# Patient Record
Sex: Male | Born: 1990 | Race: Black or African American | Hispanic: No | Marital: Single | Smoking: Current every day smoker
Health system: Southern US, Community
[De-identification: ages and names within clinical notes are randomized; demographics above are authoritative.]

## PROBLEM LIST (undated history)

## (undated) DIAGNOSIS — I10 Essential (primary) hypertension: Secondary | ICD-10-CM

## (undated) DIAGNOSIS — K921 Melena: Secondary | ICD-10-CM

## (undated) DIAGNOSIS — R569 Unspecified convulsions: Secondary | ICD-10-CM

## (undated) DIAGNOSIS — D124 Benign neoplasm of descending colon: Secondary | ICD-10-CM

## (undated) DIAGNOSIS — J45909 Unspecified asthma, uncomplicated: Secondary | ICD-10-CM

## (undated) HISTORY — DX: Melena: K92.1

## (undated) HISTORY — DX: Unspecified convulsions: R56.9

## (undated) HISTORY — DX: Benign neoplasm of descending colon: D12.4

---
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
## 2009-05-27 ENCOUNTER — Ambulatory Visit: Payer: Self-pay | Admitting: Family Medicine

## 2009-06-13 ENCOUNTER — Encounter (INDEPENDENT_AMBULATORY_CARE_PROVIDER_SITE_OTHER): Payer: Self-pay | Admitting: *Deleted

## 2009-06-13 DIAGNOSIS — F17200 Nicotine dependence, unspecified, uncomplicated: Secondary | ICD-10-CM | POA: Insufficient documentation

## 2009-11-24 ENCOUNTER — Ambulatory Visit: Payer: Self-pay | Admitting: Family Medicine

## 2009-11-24 ENCOUNTER — Encounter: Payer: Self-pay | Admitting: Family Medicine

## 2009-11-25 ENCOUNTER — Telehealth: Payer: Self-pay | Admitting: Family Medicine

## 2009-12-09 ENCOUNTER — Ambulatory Visit: Payer: Self-pay | Admitting: Family Medicine

## 2010-02-01 ENCOUNTER — Emergency Department (HOSPITAL_COMMUNITY): Admission: EM | Admit: 2010-02-01 | Discharge: 2010-02-02 | Payer: Self-pay | Admitting: Emergency Medicine

## 2010-07-09 ENCOUNTER — Encounter: Payer: Self-pay | Admitting: Family Medicine

## 2010-07-12 ENCOUNTER — Emergency Department (HOSPITAL_COMMUNITY)
Admission: EM | Admit: 2010-07-12 | Discharge: 2010-07-12 | Payer: Self-pay | Source: Home / Self Care | Admitting: Emergency Medicine

## 2010-10-04 ENCOUNTER — Ambulatory Visit: Admit: 2010-10-04 | Payer: Self-pay

## 2010-10-05 NOTE — Assessment & Plan Note (Signed)
Summary: NURSE VISIT/SUTURE REMOVAL/KH    Complete Medication List: 1)  Proair Hfa 108 (90 Base) Mcg/act Aers (Albuterol sulfate) .... 2 puffs qid prn 2)  Ultram 50 Mg Tabs (Tramadol hcl) .Marland Kitchen.. 1 tab by mouth q6h as needed for pain  Other Orders: Est Level 1- [facility] (29562)
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
## 2010-10-05 NOTE — Progress Notes (Signed)
Summary: meds prob  Medications Added ULTRAM 50 MG TABS (TRAMADOL HCL) 1 tab by mouth q6h as needed for pain       Phone Note Call from Patient Call back at [phone]   Caller: Patient Summary of Call: pt is allergic to Ibuprofen and is in pain today - needs something else called in. [facility]- [address]  Initial call taken by: De Nurse,  November 25, 2009 9:46 AM  Follow-up for Phone Call        "has a voice mailbox that has not been set up yet" will wait for him to call back. will foward to pcp Follow-up by: Golden Circle RN,  November 25, 2009 9:51 AM  Additional Follow-up for Phone Call Additional follow up Details #1::        he is taking 4-5 of the motrin 800 at a time. told him to stop & why. states tylenol does not help either. to md to see if there is something else he wants to order Additional Follow-up by: Golden Circle RN,  November 25, 2009 11:28 AM    Additional Follow-up for Phone Call Additional follow up Details #2::    Mom calling back checking on status of pain meds for son. Follow-up by: Clydell Hakim,  November 26, 2009 9:56 AM  Additional Follow-up for Phone Call Additional follow up Details #3:: Details for Additional Follow-up Action Taken: I specifically ask the patient if he had any allergies and he denied any allergies.  Will give him 4 day supply of ultram. Additional Follow-up by: Marisue Ivan  MD,  November 26, 2009 1:43 PM  New/Updated Medications: ULTRAM 50 MG TABS (TRAMADOL HCL) 1 tab by mouth q6h as needed for pain Prescriptions: ULTRAM 50 MG TABS (TRAMADOL HCL) 1 tab by mouth q6h as needed for pain  #16 x 0   Entered and Authorized by:   Marisue Ivan  MD   Signed by:   Marisue Ivan  MD on 11/26/2009   Method used:   Electronically to        [facility] [phone]* (retail)       [address]       [place], [place]  [address]       Ph: [phone]       Fax: [phone]   RxID:   [id number]

## 2010-10-05 NOTE — Miscellaneous (Signed)
Summary: Clean up Problem List

## 2010-10-05 NOTE — Miscellaneous (Signed)
Summary: Consent: Laceration Repair  Consent: Laceration Repair   Imported By: Knox Royalty 12/21/2009 11:46:11  _____________________________________________________________________  External Attachment:    Type:   Image     Comment:   External Document

## 2013-02-01 ENCOUNTER — Emergency Department (HOSPITAL_COMMUNITY)
Admission: EM | Admit: 2013-02-01 | Discharge: 2013-02-01 | Discharge status: Against Medical Advice | Payer: No Typology Code available for payment source | Attending: Emergency Medicine | Admitting: Emergency Medicine

## 2013-02-01 ENCOUNTER — Encounter (HOSPITAL_COMMUNITY): Payer: Self-pay | Admitting: *Deleted

## 2013-02-01 ENCOUNTER — Emergency Department (INDEPENDENT_AMBULATORY_CARE_PROVIDER_SITE_OTHER)
Admission: EM | Admit: 2013-02-01 | Discharge: 2013-02-01 | Disposition: A | Discharge status: Nursing Home (Includes ICF) | Payer: Self-pay | Source: Home / Self Care | Attending: Family Medicine | Admitting: Family Medicine

## 2013-02-01 ENCOUNTER — Emergency Department (HOSPITAL_COMMUNITY): Payer: No Typology Code available for payment source

## 2013-02-01 ENCOUNTER — Emergency Department (HOSPITAL_COMMUNITY)
Admission: EM | Admit: 2013-02-01 | Discharge: 2013-02-01 | Disposition: A | Discharge status: Home / Self Care | Payer: No Typology Code available for payment source | Attending: Emergency Medicine | Admitting: Emergency Medicine

## 2013-02-01 DIAGNOSIS — S0990XA Unspecified injury of head, initial encounter: Secondary | ICD-10-CM | POA: Insufficient documentation

## 2013-02-01 DIAGNOSIS — F17200 Nicotine dependence, unspecified, uncomplicated: Secondary | ICD-10-CM | POA: Insufficient documentation

## 2013-02-01 DIAGNOSIS — R112 Nausea with vomiting, unspecified: Secondary | ICD-10-CM | POA: Insufficient documentation

## 2013-02-01 DIAGNOSIS — M542 Cervicalgia: Secondary | ICD-10-CM

## 2013-02-01 DIAGNOSIS — Y998 Other external cause status: Secondary | ICD-10-CM | POA: Insufficient documentation

## 2013-02-01 DIAGNOSIS — R51 Headache: Secondary | ICD-10-CM

## 2013-02-01 DIAGNOSIS — R519 Headache, unspecified: Secondary | ICD-10-CM

## 2013-02-01 DIAGNOSIS — Y92410 Unspecified street and highway as the place of occurrence of the external cause: Secondary | ICD-10-CM | POA: Insufficient documentation

## 2013-02-01 DIAGNOSIS — Y9389 Activity, other specified: Secondary | ICD-10-CM | POA: Insufficient documentation

## 2013-02-01 MED ORDER — IBUPROFEN 800 MG PO TABS: 800.0000 mg | ORAL_TABLET | Freq: Three times a day (TID) | ORAL | Status: DC

## 2013-02-01 MED ORDER — ONDANSETRON HCL 4 MG PO TABS: 4.0000 mg | ORAL_TABLET | Freq: Four times a day (QID) | ORAL | Status: DC

## 2013-02-01 MED ORDER — CYCLOBENZAPRINE HCL 10 MG PO TABS: 10.0000 mg | ORAL_TABLET | Freq: Two times a day (BID) | ORAL | Status: DC | PRN

## 2013-02-01 NOTE — ED Notes (Signed)
Pt wanting to leave so he can go pick up his son. MD aware. Pt is going to leave AMA. Pt informed of risks if he leaves.
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
## 2013-02-01 NOTE — ED Notes (Signed)
Chart review.
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
## 2013-02-02 NOTE — ED Provider Notes (Signed)
Medical screening examination/treatment/procedure(s) were performed by non-physician practitioner and as supervising physician I was immediately available for consultation/collaboration.  Kymiah Araiza, MD 02/02/13 0022 

## 2013-02-02 NOTE — ED Provider Notes (Signed)
Medical screening examination/treatment/procedure(s) were performed by resident physician or non-physician practitioner and as supervising physician I was immediately available for consultation/collaboration.   Barkley Bruns MD.   Linna Hoff, MD 02/02/13 [phone]

## 2014-07-09 ENCOUNTER — Ambulatory Visit: Payer: Self-pay | Admitting: Family Medicine

## 2016-02-28 ENCOUNTER — Emergency Department (HOSPITAL_COMMUNITY)
Admission: EM | Admit: 2016-02-28 | Discharge: 2016-02-28 | Disposition: A | Discharge status: Home / Self Care | Payer: No Typology Code available for payment source | Attending: Emergency Medicine | Admitting: Emergency Medicine

## 2016-02-28 ENCOUNTER — Encounter (HOSPITAL_COMMUNITY): Payer: Self-pay | Admitting: *Deleted

## 2016-02-28 DIAGNOSIS — J45909 Unspecified asthma, uncomplicated: Secondary | ICD-10-CM | POA: Diagnosis not present

## 2016-02-28 DIAGNOSIS — M545 Low back pain, unspecified: Secondary | ICD-10-CM

## 2016-02-28 DIAGNOSIS — M542 Cervicalgia: Secondary | ICD-10-CM | POA: Diagnosis not present

## 2016-02-28 DIAGNOSIS — Y999 Unspecified external cause status: Secondary | ICD-10-CM | POA: Insufficient documentation

## 2016-02-28 DIAGNOSIS — Y939 Activity, unspecified: Secondary | ICD-10-CM | POA: Insufficient documentation

## 2016-02-28 DIAGNOSIS — F17210 Nicotine dependence, cigarettes, uncomplicated: Secondary | ICD-10-CM | POA: Insufficient documentation

## 2016-02-28 DIAGNOSIS — Y92410 Unspecified street and highway as the place of occurrence of the external cause: Secondary | ICD-10-CM | POA: Insufficient documentation

## 2016-02-28 DIAGNOSIS — M791 Myalgia: Secondary | ICD-10-CM | POA: Diagnosis present

## 2016-02-28 HISTORY — DX: Unspecified asthma, uncomplicated: J45.909

## 2016-02-28 MED ORDER — NAPROXEN 250 MG PO TABS: 250.0000 mg | ORAL_TABLET | Freq: Two times a day (BID) | ORAL | Status: DC

## 2016-02-28 MED ORDER — METHOCARBAMOL 500 MG PO TABS: 500.0000 mg | ORAL_TABLET | Freq: Two times a day (BID) | ORAL | Status: DC | PRN
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
## 2016-03-14 ENCOUNTER — Emergency Department (HOSPITAL_COMMUNITY): Payer: Medicaid Other

## 2016-03-14 ENCOUNTER — Emergency Department (HOSPITAL_COMMUNITY)
Admission: EM | Admit: 2016-03-14 | Discharge: 2016-03-14 | Disposition: A | Discharge status: Home / Self Care | Payer: Medicaid Other | Attending: Emergency Medicine | Admitting: Emergency Medicine

## 2016-03-14 ENCOUNTER — Encounter (HOSPITAL_COMMUNITY): Payer: Self-pay | Admitting: *Deleted

## 2016-03-14 DIAGNOSIS — Y929 Unspecified place or not applicable: Secondary | ICD-10-CM | POA: Insufficient documentation

## 2016-03-14 DIAGNOSIS — S62334A Displaced fracture of neck of fourth metacarpal bone, right hand, initial encounter for closed fracture: Secondary | ICD-10-CM | POA: Insufficient documentation

## 2016-03-14 DIAGNOSIS — Y999 Unspecified external cause status: Secondary | ICD-10-CM | POA: Diagnosis not present

## 2016-03-14 DIAGNOSIS — S62316A Displaced fracture of base of fifth metacarpal bone, right hand, initial encounter for closed fracture: Secondary | ICD-10-CM | POA: Insufficient documentation

## 2016-03-14 DIAGNOSIS — S6291XA Unspecified fracture of right wrist and hand, initial encounter for closed fracture: Secondary | ICD-10-CM

## 2016-03-14 DIAGNOSIS — F17210 Nicotine dependence, cigarettes, uncomplicated: Secondary | ICD-10-CM | POA: Insufficient documentation

## 2016-03-14 DIAGNOSIS — S6991XA Unspecified injury of right wrist, hand and finger(s), initial encounter: Secondary | ICD-10-CM | POA: Diagnosis present

## 2016-03-14 DIAGNOSIS — I10 Essential (primary) hypertension: Secondary | ICD-10-CM | POA: Diagnosis not present

## 2016-03-14 DIAGNOSIS — Y939 Activity, unspecified: Secondary | ICD-10-CM | POA: Diagnosis not present

## 2016-03-14 DIAGNOSIS — J45909 Unspecified asthma, uncomplicated: Secondary | ICD-10-CM | POA: Diagnosis not present

## 2016-03-14 MED ORDER — IBUPROFEN 400 MG PO TABS
800.0000 mg | ORAL_TABLET | Freq: Once | ORAL | Status: AC
  Administered 2016-03-14: 800 mg via ORAL
  Filled 2016-03-14: qty 2

## 2016-03-14 MED ORDER — TRAMADOL HCL 50 MG PO TABS: 50.0000 mg | ORAL_TABLET | Freq: Four times a day (QID) | ORAL | Status: DC | PRN

## 2016-03-14 MED ORDER — NAPROXEN 500 MG PO TABS: 500.0000 mg | ORAL_TABLET | Freq: Two times a day (BID) | ORAL | Status: DC

## 2016-03-14 NOTE — ED Notes (Signed)
Ortho aware of splint

## 2016-03-14 NOTE — Progress Notes (Signed)
Orthopedic Tech Progress Note Patient Details:  Timothy Hubbard March 07, 1991 [id number]  Ortho Devices Type of Ortho Device: Arm sling, Ulna gutter splint Ortho Device/Splint Location: rue Ortho Device/Splint Interventions: Ordered, Application   Karolee Stamps 03/14/2016, 10:27 PM

## 2016-03-14 NOTE — ED Notes (Signed)
Patient called for room 3X with no response 
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
## 2016-03-14 NOTE — Discharge Instructions (Signed)
Hypertension Hypertension, commonly called high blood pressure, is when the force of blood pumping through your arteries is too strong. Your arteries are the blood vessels that carry blood from your heart throughout your body. A blood pressure reading consists of a higher number over a lower number, such as 110/72. The higher number (systolic) is the pressure inside your arteries when your heart pumps. The lower number (diastolic) is the pressure inside your arteries when your heart relaxes. Ideally you want your blood pressure below 120/80. Hypertension forces your heart to work harder to pump blood. Your arteries may become narrow or stiff. Having untreated or uncontrolled hypertension can cause heart attack, stroke, kidney disease, and other problems. RISK FACTORS Some risk factors for high blood pressure are controllable. Others are not.  Risk factors you cannot control include:   Race. You may be at higher risk if you are African American.  Age. Risk increases with age.  Gender. Men are at higher risk than women before age 52 years. After age 51, women are at higher risk than men. Risk factors you can control include:  Not getting enough exercise or physical activity.  Being overweight.  Getting too much fat, sugar, calories, or salt in your diet.  Drinking too much alcohol. SIGNS AND SYMPTOMS Hypertension does not usually cause signs or symptoms. Extremely high blood pressure (hypertensive crisis) may cause headache, anxiety, shortness of breath, and nosebleed. DIAGNOSIS To check if you have hypertension, your health care provider will measure your blood pressure while you are seated, with your arm held at the level of your heart. It should be measured at least twice using the same arm. Certain conditions can cause a difference in blood pressure between your right and left arms. A blood pressure reading that is higher than normal on one occasion does not mean that you need treatment.  If it is not clear whether you have high blood pressure, you may be asked to return on a different day to have your blood pressure checked again. Or, you may be asked to monitor your blood pressure at home for 1 or more weeks. TREATMENT Treating high blood pressure includes making lifestyle changes and possibly taking medicine. Living a healthy lifestyle can help lower high blood pressure. You may need to change some of your habits. Lifestyle changes may include:  Following the DASH diet. This diet is high in fruits, vegetables, and whole grains. It is low in salt, red meat, and added sugars.  Keep your sodium intake below 2,300 mg per day.  Getting at least 30-45 minutes of aerobic exercise at least 4 times per week.  Losing weight if necessary.  Not smoking.  Limiting alcoholic beverages.  Learning ways to reduce stress. Your health care provider may prescribe medicine if lifestyle changes are not enough to get your blood pressure under control, and if one of the following is true:  You are 34-43 years of age and your systolic blood pressure is above 140.  You are 20 years of age or older, and your systolic blood pressure is above 150.  Your diastolic blood pressure is above 90.  You have diabetes, and your systolic blood pressure is over [id number] or your diastolic blood pressure is over 90.  You have kidney disease and your blood pressure is above 140/90.  You have heart disease and your blood pressure is above 140/90. Your personal target blood pressure may vary depending on your medical conditions, your age, and other factors. HOME CARE INSTRUCTIONS  Have your blood pressure rechecked as directed by your health care provider.   Take medicines only as directed by your health care provider. Follow the directions carefully. Blood pressure medicines must be taken as prescribed. The medicine does not work as well when you skip doses. Skipping doses also puts you at risk for  problems.  Do not smoke.   Monitor your blood pressure at home as directed by your health care provider. SEEK MEDICAL CARE IF:   You think you are having a reaction to medicines taken.  You have recurrent headaches or feel dizzy.  You have swelling in your ankles.  You have trouble with your vision. SEEK IMMEDIATE MEDICAL CARE IF:  You develop a severe headache or confusion.  You have unusual weakness, numbness, or feel faint.  You have severe chest or abdominal pain.  You vomit repeatedly.  You have trouble breathing. MAKE SURE YOU:   Understand these instructions.  Will watch your condition.  Will get help right away if you are not doing well or get worse.   This information is not intended to replace advice given to you by your health care provider. Make sure you discuss any questions you have with your health care provider.   Document Released: 08/22/2005 Document Revised: 01/06/2015 Document Reviewed: 06/14/2013 Elsevier Interactive Patient Education 2016 [employer] DASH Eating Plan DASH stands for "Dietary Approaches to Stop Hypertension." The DASH eating plan is a healthy eating plan that has been shown to reduce high blood pressure (hypertension). Additional health benefits may include reducing the risk of type 2 diabetes mellitus, heart disease, and stroke. The DASH eating plan may also help with weight loss. WHAT DO I NEED TO KNOW ABOUT THE DASH EATING PLAN? For the DASH eating plan, you will follow these general guidelines:  Choose foods with a percent daily value for sodium of less than 5% (as listed on the food label).  Use salt-free seasonings or herbs instead of table salt or sea salt.  Check with your health care provider or pharmacist before using salt substitutes.  Eat lower-sodium products, often labeled as "lower sodium" or "no salt added."  Eat fresh foods.  Eat more vegetables, fruits, and low-fat dairy products.  Choose whole grains.  Look for the word "whole" as the first word in the ingredient list.  Choose fish and skinless chicken or turkey more often than red meat. Limit fish, poultry, and meat to 6 oz (170 g) each day.  Limit sweets, desserts, sugars, and sugary drinks.  Choose heart-healthy fats.  Limit cheese to 1 oz (28 g) per day.  Eat more home-cooked food and less restaurant, buffet, and fast food.  Limit fried foods.  Cook foods using methods other than frying.  Limit canned vegetables. If you do use them, rinse them well to decrease the sodium.  When eating at a restaurant, ask that your food be prepared with less salt, or no salt if possible. WHAT FOODS CAN I EAT? Seek help from a dietitian for individual calorie needs. Grains Whole grain or whole wheat bread. Brown rice. Whole grain or whole wheat pasta. Quinoa, bulgur, and whole grain cereals. Low-sodium cereals. Corn or whole wheat flour tortillas. Whole grain cornbread. Whole grain crackers. Low-sodium crackers. Vegetables Fresh or frozen vegetables (raw, steamed, roasted, or grilled). Low-sodium or reduced-sodium tomato and vegetable juices. Low-sodium or reduced-sodium tomato sauce and paste. Low-sodium or reduced-sodium canned vegetables.  Fruits All fresh, canned (in natural juice), or frozen fruits. Meat and Other   Other Protein Products Ground beef (85% or leaner), grass-fed beef, or beef trimmed of fat. Skinless chicken or [place]. Ground chicken or [place]. Pork trimmed of fat. All fish and seafood. Eggs. Dried beans, peas, or lentils. Unsalted nuts and seeds. Unsalted canned beans. Dairy Low-fat dairy products, such as skim or 1% milk, 2% or reduced-fat cheeses, low-fat ricotta or cottage cheese, or plain low-fat yogurt. Low-sodium or reduced-sodium cheeses. Fats and Oils Tub margarines without trans fats. Light or reduced-fat mayonnaise and salad dressings (reduced sodium). Avocado. Safflower, olive, or canola oils. Natural peanut or  almond butter. Other Unsalted popcorn and pretzels. The items listed above may not be a complete list of recommended foods or beverages. Contact your dietitian for more options. WHAT FOODS ARE NOT RECOMMENDED? Grains White bread. White pasta. White rice. Refined cornbread. Bagels and croissants. Crackers that contain trans fat. Vegetables Creamed or fried vegetables. Vegetables in a cheese sauce. Regular canned vegetables. Regular canned tomato sauce and paste. Regular tomato and vegetable juices. Fruits Dried fruits. Canned fruit in light or heavy syrup. Fruit juice. Meat and Other Protein Products Fatty cuts of meat. Ribs, chicken wings, bacon, sausage, bologna, salami, chitterlings, fatback, hot dogs, bratwurst, and packaged luncheon meats. Salted nuts and seeds. Canned beans with salt. Dairy Whole or 2% milk, cream, half-and-half, and cream cheese. Whole-fat or sweetened yogurt. Full-fat cheeses or blue cheese. Nondairy creamers and whipped toppings. Processed cheese, cheese spreads, or cheese curds. Condiments Onion and garlic salt, seasoned salt, table salt, and sea salt. Canned and packaged gravies. Worcestershire sauce. Tartar sauce. Barbecue sauce. Teriyaki sauce. Soy sauce, including reduced sodium. Steak sauce. Fish sauce. Oyster sauce. Cocktail sauce. Horseradish. Ketchup and mustard. Meat flavorings and tenderizers. Bouillon cubes. Hot sauce. Tabasco sauce. Marinades. Taco seasonings. Relishes. Fats and Oils Butter, stick margarine, lard, shortening, ghee, and bacon fat. Coconut, palm kernel, or palm oils. Regular salad dressings. Other Pickles and olives. Salted popcorn and pretzels. The items listed above may not be a complete list of foods and beverages to avoid. Contact your dietitian for more information. WHERE CAN I FIND MORE INFORMATION? National Heart, Lung, and Blood Institute: travelstabloid.com   This information is not intended to  replace advice given to you by your health care provider. Make sure you discuss any questions you have with your health care provider.   Document Released: 08/11/2011 Document Revised: 09/12/2014 Document Reviewed: 06/26/2013 Elsevier Interactive Patient Education 2016 [place] or Splint Care Casts and splints support injured limbs and keep bones from moving while they heal. It is important to care for your cast or splint at home.  HOME CARE INSTRUCTIONS  Keep the cast or splint uncovered during the drying period. It can take 24 to 48 hours to dry if it is made of plaster. A fiberglass cast will dry in less than 1 hour.  Do not rest the cast on anything harder than a pillow for the first 24 hours.  Do not put weight on your injured limb or apply pressure to the cast until your health care provider gives you permission.  Keep the cast or splint dry. Wet casts or splints can lose their shape and may not support the limb as well. A wet cast that has lost its shape can also create harmful pressure on your skin when it dries. Also, wet skin can become infected.  Cover the cast or splint with a plastic bag when bathing or when out in the rain or snow. If the cast is  on the trunk of the body, take sponge baths until the cast is removed.  If your cast does become wet, dry it with a towel or a blow dryer on the cool setting only.  Keep your cast or splint clean. Soiled casts may be wiped with a moistened cloth.  Do not place any hard or soft foreign objects under your cast or splint, such as cotton, toilet paper, lotion, or powder.  Do not try to scratch the skin under the cast with any object. The object could get stuck inside the cast. Also, scratching could lead to an infection. If itching is a problem, use a blow dryer on a cool setting to relieve discomfort.  Do not trim or cut your cast or remove padding from inside of it.  Exercise all joints next to the injury that are not  immobilized by the cast or splint. For example, if you have a long leg cast, exercise the hip joint and toes. If you have an arm cast or splint, exercise the shoulder, elbow, thumb, and fingers.  Elevate your injured arm or leg on 1 or 2 pillows for the first 1 to 3 days to decrease swelling and pain.It is best if you can comfortably elevate your cast so it is higher than your heart. SEEK MEDICAL CARE IF:   Your cast or splint cracks.  Your cast or splint is too tight or too loose.  You have unbearable itching inside the cast.  Your cast becomes wet or develops a soft spot or area.  You have a bad smell coming from inside your cast.  You get an object stuck under your cast.  Your skin around the cast becomes red or raw.  You have new pain or worsening pain after the cast has been applied. SEEK IMMEDIATE MEDICAL CARE IF:   You have fluid leaking through the cast.  You are unable to move your fingers or toes.  You have discolored (blue or white), cool, painful, or very swollen fingers or toes beyond the cast.  You have tingling or numbness around the injured area.  You have severe pain or pressure under the cast.  You have any difficulty with your breathing or have shortness of breath.  You have chest pain.   This information is not intended to replace advice given to you by your health care provider. Make sure you discuss any questions you have with your health care provider.   Document Released: 08/19/2000 Document Revised: 06/12/2013 Document Reviewed: 02/28/2013 Elsevier Interactive Patient Education 2016 [place]. Boxer's Fracture A boxer's fracture is a break (fracture) of the bone in your hand that connects your little finger to your wrist (fifth metacarpal). This type of fracture usually happens at the end of the bone, closest to the little finger. The knuckle is often pushed down by the impact. In some cases, only a splint or brace is needed, or you may need a  cast. Casting or splinting may include taping your injured finger to the next finger (buddy taping). You may need surgery to repair the fracture. This may involve the use of wires, screws, or plates to hold the bone pieces in place.  CAUSES This injury may be caused by:   Hitting an object with a clenched fist.  A hard, direct hit to the hand.  An injury that crushes the hand. RISK FACTORS This injury is more likely to occur if:  You are in a fistfight.  You have certain bone diseases. SYMPTOMS  Symptoms of this type of fracture develop soon after the injury. Symptoms may include:  Swelling of the hand.  Pain.  Pain when moving the fifth finger or touching the hand.  Abnormal position of the finger.  Not being able to move the finger.  A shortened finger.  A finger knuckle that looks sunken in. DIAGNOSIS This injury may be diagnosed based on your symptoms, especially if you had a recent hand injury. Your health care provider will perform a physical exam, and you may also have X-rays to confirm the diagnosis. TREATMENT  Treatment for this injury depends on how severe it is. Possible treatments include:  Closed reduction. If your bone is stable and can be moved back into place, you may only need to wear a cast or splint or have buddy taping.  Open reduction with internal fixation (ORIF). This may be needed if your fracture is far out of place or goes through the joint surface of the bone. This treatment involves open surgery to move your bones back into the right position. Screws, wires, or plates may be used to stabilize the fracture. You may need to wear a cast or a splint for several weeks. You will also need to have follow-up X-rays to make sure that the bone is healing well and staying in position. After you no longer need the cast or splint, you may need physical therapy. This will help you to regain full movement and strength in your hand.  HOME CARE INSTRUCTIONS If You  Have a Cast:  Do not stick anything inside the cast to scratch your skin. Doing that increases your risk of infection.  Check the skin around the cast every day. Report any concerns to your health care provider. You may put lotion on dry skin around the edges of the cast. Do not apply lotion to the skin underneath the cast. If You Have a Splint:  Wear it as directed by your health care provider. Remove it only as directed by your health care provider.  Loosen the splint if your fingers become numb and tingle, or if they turn cold and blue. Bathing  Cover the cast or splint with a watertight plastic bag to protect it from water while you take a bath or a shower. Do not let the cast or splint get wet. Managing Pain, Stiffness, and Swelling  If directed, apply ice to the injured area (if you have a splint, not a cast):  Put ice in a plastic bag.  Place a towel between your skin and the bag.  Leave the ice on for 20 minutes, 2-3 times a day.  Move your fingers often to avoid stiffness and to lessen swelling.  Raise the injured area above the level of your heart while you are sitting or lying down. Driving  Do not drive or operate heavy machinery while taking pain medicine.  Do not drive while wearing a cast or splint on a hand or foot that you use for driving. Activity  Return to your normal activities as directed by your health care provider. Ask your health care provider what activities are safe for you. General Instructions  Do not put pressure on any part of the cast or splint until it is fully hardened. This may take several hours.  Keep the cast or splint clean and dry.  Do not use any tobacco products, including cigarettes, chewing tobacco, or electronic cigarettes. Tobacco can delay bone healing. If you need help quitting, ask your health care  provider.  Take medicines only as directed by your health care provider.  Keep all follow-up visits as directed by your health  care provider. This is important. SEEK MEDICAL CARE IF:  Your pain is getting worse.  You have redness, swelling, or pain in the injured area.  You have fluid, blood, or pus coming from under your cast or splint.  You notice a bad smell coming from under your cast or splint.  You have a fever.  Your cast or splint feels too tight or too loose.  You cast is coming apart. SEEK IMMEDIATE MEDICAL CARE IF:  You develop a rash.  You have trouble breathing.  Your skin or nails on your injured hand turn blue or gray even after you loosen your splint.  Your injured hand feels cold or becomes numb even after you loosen your splint.  You develop severe pain under the cast or in your hand.   This information is not intended to replace advice given to you by your health care provider. Make sure you discuss any questions you have with your health care provider.   Document Released: 08/22/2005 Document Revised: 05/13/2015 Document Reviewed: 06/11/2014 Elsevier Interactive Patient Education [employer].

## 2016-04-01 ENCOUNTER — Other Ambulatory Visit (HOSPITAL_COMMUNITY)
Admission: RE | Admit: 2016-04-01 | Discharge: 2016-04-01 | Disposition: A | Discharge status: Home / Self Care | Payer: Medicaid Other | Source: Ambulatory Visit | Attending: Family Medicine | Admitting: Family Medicine

## 2016-04-01 ENCOUNTER — Encounter: Payer: Self-pay | Admitting: Student

## 2016-04-01 ENCOUNTER — Ambulatory Visit (INDEPENDENT_AMBULATORY_CARE_PROVIDER_SITE_OTHER): Payer: Medicaid Other | Admitting: Student

## 2016-04-01 VITALS — BP 138/90 | HR 96 | Temp 97.7°F | Ht 64.0 in | Wt 177.8 lb

## 2016-04-01 DIAGNOSIS — J4520 Mild intermittent asthma, uncomplicated: Secondary | ICD-10-CM | POA: Diagnosis not present

## 2016-04-01 DIAGNOSIS — IMO0001 Reserved for inherently not codable concepts without codable children: Secondary | ICD-10-CM

## 2016-04-01 DIAGNOSIS — J45909 Unspecified asthma, uncomplicated: Secondary | ICD-10-CM | POA: Insufficient documentation

## 2016-04-01 DIAGNOSIS — J4541 Moderate persistent asthma with (acute) exacerbation: Secondary | ICD-10-CM | POA: Insufficient documentation

## 2016-04-01 DIAGNOSIS — R030 Elevated blood-pressure reading, without diagnosis of hypertension: Secondary | ICD-10-CM

## 2016-04-01 DIAGNOSIS — Z0000 Encounter for general adult medical examination without abnormal findings: Secondary | ICD-10-CM | POA: Diagnosis not present

## 2016-04-01 DIAGNOSIS — F17200 Nicotine dependence, unspecified, uncomplicated: Secondary | ICD-10-CM | POA: Diagnosis not present

## 2016-04-01 DIAGNOSIS — Z114 Encounter for screening for human immunodeficiency virus [HIV]: Secondary | ICD-10-CM | POA: Diagnosis not present

## 2016-04-01 DIAGNOSIS — Z113 Encounter for screening for infections with a predominantly sexual mode of transmission: Secondary | ICD-10-CM | POA: Insufficient documentation

## 2016-04-01 DIAGNOSIS — I10 Essential (primary) hypertension: Secondary | ICD-10-CM | POA: Insufficient documentation

## 2016-04-01 MED ORDER — ALBUTEROL SULFATE HFA 108 (90 BASE) MCG/ACT IN AERS: 2.0000 | INHALATION_SPRAY | Freq: Four times a day (QID) | RESPIRATORY_TRACT | 0 refills | Status: DC | PRN

## 2016-04-01 MED ORDER — NICOTINE 14 MG/24HR TD PT24: 14.0000 mg | MEDICATED_PATCH | Freq: Every day | TRANSDERMAL | 0 refills | Status: DC

## 2016-04-01 NOTE — Assessment & Plan Note (Addendum)
Noted to have elevated blood pressure to 152/103 on arrival. Repeat blood pressure 138/90. No history of hypertension. Family history significant for mother with hypertension.  -We will evaluate when he returns

## 2016-04-01 NOTE — Assessment & Plan Note (Signed)
Advised him to quit smoking. Gave him quit line number. Gave him nicotine patches. Patient to return in 2 weeks to follow-up on this

## 2016-04-01 NOTE — Patient Instructions (Addendum)
It was great seeing you today! We have addressed the following issues today  1. Annual physical: your physical exam is normal.  2. Asthma: I have sent a prescription for albuterol to your pharmacy 3. Smoking: I have sent a prescription for nicotine patches to your pharmacy. I will like you to come back and see [place] in 2 weeks. Meanwhile, Call 1800-QUIT-NOW for help with stopping smoking.   If we did any lab work today, and the results require attention, either me or my nurse will get in touch with you. If everything is normal, you will get a letter in mail. If you don't hear from [place] in two weeks, please give [place] a call. Otherwise, I look forward to talking with you again at our next visit. If you have any questions or concerns before then, please call the clinic at [phone].  Please bring all your medications to every doctors visit   Sign up for My Chart to have easy access to your labs results, and communication with your Primary care physician.    Please check-out at the front desk before leaving the clinic.   Take Care,

## 2016-04-01 NOTE — Assessment & Plan Note (Signed)
Endorses history of unprotected sex.  -HIV, GC/CT today

## 2016-04-01 NOTE — Assessment & Plan Note (Signed)
Refilled his prescription for albuterol inhaler today
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
## 2016-04-02 LAB — HIV ANTIBODY (ROUTINE TESTING W REFLEX): HIV: NONREACTIVE

## 2016-04-04 LAB — URINE CYTOLOGY ANCILLARY ONLY
CHLAMYDIA, DNA PROBE: NEGATIVE
Neisseria Gonorrhea: NEGATIVE
Trichomonas: POSITIVE — AB

## 2016-04-05 ENCOUNTER — Other Ambulatory Visit: Payer: Self-pay | Admitting: Student

## 2016-04-05 ENCOUNTER — Telehealth: Payer: Self-pay

## 2016-04-05 DIAGNOSIS — A599 Trichomoniasis, unspecified: Secondary | ICD-10-CM

## 2016-04-05 MED ORDER — METRONIDAZOLE 500 MG PO TABS: 2000.0000 mg | ORAL_TABLET | Freq: Once | ORAL | 0 refills | Status: AC

## 2016-04-05 NOTE — Telephone Encounter (Signed)
Patient can come into clinic for treatment.  If unable to reach patient, a prescription can be sent to pharmacy depending what the treatment is for.  Derl Barrow, RN

## 2016-04-05 NOTE — Telephone Encounter (Signed)
Tried to call patient. The automated message says he can't receive call at this time. Is he supposed to come to the clinic for DOT (direct observed therapy)? Thanks!

## 2016-04-05 NOTE — Telephone Encounter (Signed)
Received notice of pt Timothy Hubbard positive STD report. Will forward this to PCP to be advised. Ottis Stain, CMA

## 2016-04-08 NOTE — Telephone Encounter (Signed)
appt on 04-22-16. Timothy Hubbard,CMA

## 2016-04-22 ENCOUNTER — Ambulatory Visit: Payer: Medicaid Other | Admitting: Student

## 2016-05-03 NOTE — Telephone Encounter (Signed)
Pt has yet to return call and no showed appt on 04/22/16.  Attempted to call again with no response (no answer and no VM).  Will forward to PCP . Adriann Ballweg, Salome Spotted, CMA

## 2016-06-13 ENCOUNTER — Encounter: Payer: Self-pay | Admitting: Student

## 2016-06-13 ENCOUNTER — Ambulatory Visit (INDEPENDENT_AMBULATORY_CARE_PROVIDER_SITE_OTHER): Payer: Medicaid Other | Admitting: Student

## 2016-06-13 VITALS — BP 140/82 | HR 112 | Temp 98.6°F | Ht 64.0 in | Wt 168.0 lb

## 2016-06-13 DIAGNOSIS — R0989 Other specified symptoms and signs involving the circulatory and respiratory systems: Secondary | ICD-10-CM | POA: Diagnosis not present

## 2016-06-13 DIAGNOSIS — K219 Gastro-esophageal reflux disease without esophagitis: Secondary | ICD-10-CM | POA: Diagnosis not present

## 2016-06-13 DIAGNOSIS — F17200 Nicotine dependence, unspecified, uncomplicated: Secondary | ICD-10-CM | POA: Diagnosis not present

## 2016-06-13 DIAGNOSIS — I10 Essential (primary) hypertension: Secondary | ICD-10-CM

## 2016-06-13 LAB — POCT URINALYSIS DIPSTICK
Bilirubin, UA: NEGATIVE
Blood, UA: NEGATIVE
Glucose, UA: NEGATIVE
KETONES UA: NEGATIVE
LEUKOCYTES UA: NEGATIVE
NITRITE UA: NEGATIVE
PH UA: 5.5
PROTEIN UA: NEGATIVE
Spec Grav, UA: 1.01
Urobilinogen, UA: 0.2

## 2016-06-13 LAB — BASIC METABOLIC PANEL WITH GFR
BUN: 9 mg/dL (ref 7–25)
CHLORIDE: 104 mmol/L (ref 98–110)
CO2: 24 mmol/L (ref 20–31)
Calcium: 9 mg/dL (ref 8.6–10.3)
Creat: 0.89 mg/dL (ref 0.60–1.35)
GLUCOSE: 68 mg/dL (ref 65–99)
POTASSIUM: 3.6 mmol/L (ref 3.5–5.3)
Sodium: 142 mmol/L (ref 135–146)

## 2016-06-13 LAB — TSH: TSH: 0.75 mIU/L (ref 0.40–4.50)

## 2016-06-13 MED ORDER — METRONIDAZOLE 500 MG PO TABS: 2000.0000 mg | ORAL_TABLET | Freq: Once | ORAL | 0 refills | Status: AC

## 2016-06-13 MED ORDER — PANTOPRAZOLE SODIUM 40 MG PO TBEC: 40.0000 mg | DELAYED_RELEASE_TABLET | Freq: Every day | ORAL | 3 refills | Status: DC

## 2016-06-13 MED ORDER — HYDROCHLOROTHIAZIDE 12.5 MG PO TABS: 12.5000 mg | ORAL_TABLET | Freq: Every day | ORAL | 0 refills | Status: DC

## 2016-06-13 NOTE — Patient Instructions (Signed)
It was great seeing you today! We have addressed the following issues today  1. Blood pressure: Your goal blood pressure is later 140/90, preferably about 120/80. I have ordered an ultrasound of Your blood vessels to your kidney. Have also ordered some blood work and urine work. If the test results are abnormal, someone will get in touch with you to discuss about the results. I have also sent a medication to the pharmacy. I also recommend lifestyle changes including exercise and diet and losing some weight. I recommend you come back and see [place] in a week to follow-up on your blood pressure.  2. Reflux: I recommend avoiding sugary and spicy foods. I also sent a prescription for Protonix to the pharmacy  3. Trichomoniasis: your urine test from 2 months ago showed Trichomoniasis which is a sexually transmitted infection. Unfortunately, I wasn't able to reach you over the phone to discuss about this result. I have sent a prescription for metronidazole to the pharmacy. It is a one-time medication    If we did any lab work today, and the results require attention, either me or my nurse will get in touch with you. If everything is normal, you will get a letter in mail. If you don't hear from [place] in two weeks, please give [place] a call. Otherwise, I look forward to talking with you again at our next visit. If you have any questions or concerns before then, please call the clinic at [phone].  Please bring all your medications to every doctors visit   Sign up for My Chart to have easy access to your labs results, and communication with your Primary care physician.    Please check-out at the front desk before leaving the clinic.   Take Care,

## 2016-06-13 NOTE — Assessment & Plan Note (Addendum)
Elevated blood pressure measurements for over two years. Unclear etiology at this time. His repeat blood pressure is butter but still at the upper limit of normal. His exam is remarkable for abdominal bruit which is concerning for renal artery stenosis. He also have significant family history of hypertension at young age. His BMI is 29. Discussed about lifestyle changes including exercise and diet.  -TSH, BMP, Urinalysis -Renal artery duplex bilaterally -Hydrochlorothiazide 12.5 mg daily -Follow up in one week

## 2016-06-13 NOTE — Assessment & Plan Note (Signed)
Recommended dietary changes. Protonix 40 mg daily.

## 2016-06-13 NOTE — Progress Notes (Signed)
   Subjective:    Patient ID: Timothy Hubbard is a 25 y.o. old male.  HPI #Blood pressure: patient reports taking his mother's hydrochlorothiazide for blood pressure. He denies history of cold or heat intolerance. He repots smoking about a pack a day smoking marijuana. He denies other drug use. He denies regular exercise but reports walking his dog daily.   #GERD: reports heart burn. He was eating chips and drinking soda when I walked in.   #Trichomoniasis: his urine tests came back positive for trichomoniasis about 2 months ago. However, we were not able to reach him by phone. He states that he is no longer with his partner now. Denies penile discharge or skin lesion.   PMH: reviewed  [place]: mother, father, sister (60 years) and brother (41 years) with hypertension. No history of heart attack  SH: smoke about pack a day for 8 years. EtOH 3x24 oz twice a week. Smokes marijuana.   Review of Systems Per HPI Objective:   Vitals:   06/13/16 1523 06/13/16 1645  BP: (!) 152/94 140/82  Pulse: (!) 112   Temp: 98.6 F (37 C)   TempSrc: Oral   SpO2: 97%   Weight: 168 lb (76.2 kg)   Height: 5\' 4"  (1.626 m)     GEN: appears well, no apparent distress. Oropharynx: mmm without erythema or exudation CVS: RRR, normal s1 and s2, no murmurs, no edema, abdominal bruit left greater than right RESP: no increased work of breathing, good air movement bilaterally, no crackles or wheeze GI: Bowel sounds present and normal, soft, non-tender,non-distended ENDO: No thyromegaly NEURO: alert and oriented appropriately, no gross defecits  PSYCH: labile affect    Assessment & Plan:  Essential hypertension Elevated blood pressure measurements for over two years. Unclear etiology at this time. His repeat blood pressure is butter but still at the upper limit of normal. His exam is remarkable for abdominal bruit which is concerning for renal artery stenosis. He also have significant family history of  hypertension at young age. His BMI is 29. Discussed about lifestyle changes including exercise and diet.  -TSH, BMP, Urinalysis -Renal artery duplex bilaterally -Hydrochlorothiazide 12.5 mg daily -Follow up in one week  TOBACCO USER Gave him nicotine patch about 2 months ago. He only used it for 2 days and didn't continue. He would like to try to gain  Gastroesophageal reflux disease Recommended dietary changes. Protonix 40 mg daily.

## 2016-06-13 NOTE — Assessment & Plan Note (Signed)
Gave him nicotine patch about 2 months ago. He only used it for 2 days and didn't continue. He would like to try to gain

## 2016-06-14 ENCOUNTER — Telehealth: Payer: Self-pay | Admitting: Student

## 2016-06-14 ENCOUNTER — Encounter: Payer: Self-pay | Admitting: Student

## 2016-06-14 NOTE — Progress Notes (Signed)
TSH, UA and BMP within normal. Sent letter to patient

## 2016-06-14 NOTE — Telephone Encounter (Signed)
I have called the person that scheduled theses with [facility] twice and left two message. Awaiting a call back.

## 2016-06-14 NOTE — Telephone Encounter (Signed)
This has been scheduled. [name], [name], [name]

## 2016-06-14 NOTE — Telephone Encounter (Signed)
Pt wanted to know when the referral will be ready for a kidney u/s. Please advise. Thanks! ep

## 2016-06-15 ENCOUNTER — Other Ambulatory Visit: Payer: Self-pay | Admitting: Student

## 2016-06-15 ENCOUNTER — Telehealth: Payer: Self-pay | Admitting: Student

## 2016-06-15 DIAGNOSIS — K219 Gastro-esophageal reflux disease without esophagitis: Secondary | ICD-10-CM

## 2016-06-15 MED ORDER — OMEPRAZOLE 40 MG PO CPDR: 40.0000 mg | DELAYED_RELEASE_CAPSULE | Freq: Every day | ORAL | 3 refills | Status: DC

## 2016-06-15 NOTE — Telephone Encounter (Signed)
I don't think his diarrhea is related to his Protonix. Anyhow, I have changed his prescription to omeprazole. He may need to see the doctor if his diarrhea is not getting better. Thanks! Bretta Bang

## 2016-06-15 NOTE — Telephone Encounter (Signed)
Mother is calling and would like the doctor to change her son's acid medication to Omerprazole instead of the Protonix. He is having a lot of diarrhea. jw

## 2016-06-17 NOTE — Telephone Encounter (Signed)
Called number for Ms Timothy Hubbard. Was told I have the wrong number. Called the number for [employer]. Phone rang with no option to leave LM. Ottis Stain, CMA

## 2016-06-20 ENCOUNTER — Encounter: Payer: Self-pay | Admitting: Student

## 2016-06-20 ENCOUNTER — Ambulatory Visit: Payer: Medicaid Other | Admitting: Student

## 2016-06-20 ENCOUNTER — Ambulatory Visit (HOSPITAL_COMMUNITY)
Admission: RE | Admit: 2016-06-20 | Discharge: 2016-06-20 | Disposition: A | Discharge status: Home / Self Care | Payer: Medicaid Other | Source: Ambulatory Visit | Attending: Internal Medicine | Admitting: Internal Medicine

## 2016-06-20 ENCOUNTER — Ambulatory Visit (INDEPENDENT_AMBULATORY_CARE_PROVIDER_SITE_OTHER): Payer: Medicaid Other | Admitting: Student

## 2016-06-20 VITALS — BP 131/89 | HR 107 | Temp 98.0°F | Wt 168.0 lb

## 2016-06-20 DIAGNOSIS — R0602 Shortness of breath: Secondary | ICD-10-CM | POA: Diagnosis not present

## 2016-06-20 DIAGNOSIS — Z23 Encounter for immunization: Secondary | ICD-10-CM

## 2016-06-20 DIAGNOSIS — Z0000 Encounter for general adult medical examination without abnormal findings: Secondary | ICD-10-CM | POA: Diagnosis not present

## 2016-06-20 DIAGNOSIS — R0989 Other specified symptoms and signs involving the circulatory and respiratory systems: Secondary | ICD-10-CM | POA: Insufficient documentation

## 2016-06-20 DIAGNOSIS — I10 Essential (primary) hypertension: Secondary | ICD-10-CM | POA: Diagnosis not present

## 2016-06-20 DIAGNOSIS — J4520 Mild intermittent asthma, uncomplicated: Secondary | ICD-10-CM | POA: Diagnosis not present

## 2016-06-20 DIAGNOSIS — Z720 Tobacco use: Secondary | ICD-10-CM | POA: Insufficient documentation

## 2016-06-20 MED ORDER — ALBUTEROL SULFATE HFA 108 (90 BASE) MCG/ACT IN AERS: 2.0000 | INHALATION_SPRAY | Freq: Four times a day (QID) | RESPIRATORY_TRACT | 0 refills | Status: DC | PRN

## 2016-06-20 NOTE — Progress Notes (Signed)
   Subjective:    Patient ID: Timothy Hubbard is a 25 y.o. old male.  HPI #Hypertension: Started patient on hydrochlorothiazide 12.5 mg daily about a week ago. At that time, his exam was remarkable for abdominal bruits. I ordered a BMP, TSH, UA and vascular ultrasound of his renal arteries at that time. Patient had his ultrasound and this morning. The result is still pending. His BMP, UA and TSH are within normal limit. He reports compliance with his hydrochlorothiazide.   #Shortness of breath: Reports having shortness of breath at night about once a week. Has history of GERD. He has no formal spirometry. Uses albuterol as needed. He states his younger sister messed up with albuterol requests a refill on albuterol today. Off note, he smokes cigarettes. Currently cut down to 0.5 pack of light [place] a day.   PMH: reviewed  [place]: Mother with history of hypertension  SH: 0.5 light [place] a day  Review of Systems Per HPI Objective:   Vitals:   06/20/16 1105  BP: 131/89  Pulse: (!) 107  Temp: 98 F (36.7 C)  TempSrc: Oral  Weight: 168 lb (76.2 kg)    GEN: appears well, no apparent distress. Oropharynx: mmm without erythema or exudation CVS: RRR, normal s1 and s2, no murmurs, no edema, abdominal bruit RESP: no increased work of breathing, good air movement bilaterally, no crackles or wheeze GI: Bowel sounds present and normal, soft, non-tender,non-distended ENDO: No thyromegaly NEURO: alert and oriented appropriately, no gross defecits  PSYCH: labile affect    Assessment & Plan:  Essential hypertension Improved. Blood pressure 131/89 today.  -Continue hydrochlorothiazide 12.5 mg daily. Forgot to check his BMP today. Called patient and apologized. Then advised him to return for lab visits for BMP. Patient voiced understanding and agreed to do as advised.  -Discussed weight loss. Patient likes swimming and goes to [facility].  -Exercise goal: Going to [facility] 3-4 days a week for a total of  at least 150 minutes swimming starting on 06/22/2016. Gave him a goal sheet -Portion size: Gave him handout goal sheet -I will review his renal Doppler when I receive his result  Asthma, mild intermittent I have low suspicion for asthma. His symptoms could be due to GERD. Smoking have definitely a role to play. -Refilled his albuterol today. If no improvement, will consider spirometry -Already on PPI for GERD -Advised to quit smoking  Routine adult health maintenance -Received his flu vaccine and Tdap today. -Up-to-date

## 2016-06-20 NOTE — Assessment & Plan Note (Addendum)
-  Received his flu vaccine and Tdap today. -Up-to-date

## 2016-06-20 NOTE — Assessment & Plan Note (Addendum)
Improved. Blood pressure 131/89 today.  -Continue hydrochlorothiazide 12.5 mg daily. Forgot to check his BMP today. Called patient and apologized. Then advised him to return for lab visits for BMP. Patient voiced understanding and agreed to do as advised.  -Discussed weight loss. Patient likes swimming and goes to [facility].  -Exercise goal: Going to [facility] 3-4 days a week for a total of at least 150 minutes swimming starting on 06/22/2016. Gave him a goal sheet -Portion size: Gave him handout goal sheet -I will review his renal Doppler when I receive his result

## 2016-06-20 NOTE — Assessment & Plan Note (Signed)
I have low suspicion for asthma. His symptoms could be due to GERD. Smoking have definitely a role to play. -Refilled his albuterol today. If no improvement, will consider spirometry -Already on PPI for GERD -Advised to quit smoking

## 2016-06-20 NOTE — Patient Instructions (Addendum)
It was great seeing you today! We have addressed the following issues today  1. Blood pressure: Your blood pressure is 131/89 today. I recommend losing weight. You can do this either by exercising more or watching your diet. Please, use the goal sheet I gave you to keep track of these. I recommend follow up in one month.    If we did any lab work today, and the results require attention, either me or my nurse will get in touch with you. If everything is normal, you will get a letter in mail. If you don't hear from [place] in two weeks, please give [place] a call. Otherwise, we look forward to seeing you again at your next visit. If you have any questions or concerns before then, please call the clinic at [phone].   Please bring all your medications to every doctors visit   Sign up for My Chart to have easy access to your labs results, and communication with your Primary care physician.     Please check-out at the front desk before leaving the clinic.    Take Care,    Portion Size    Choose healthier foods such as 100% whole grains, vegetables, fruits, beans, nut seeds, olive oil, most vegetable oils, fat-free dietary, wild game and fish.   Avoid sweet tea, other sweetened beverages, soda, fruit juice, cold cereal and milk and trans fat.   Exercise at least 150 minutes per week, including weight resistance exercises 3 or 4 times per week.   Try to lose at least 7-10% of your current body weight.

## 2016-06-22 ENCOUNTER — Encounter (HOSPITAL_COMMUNITY): Payer: Medicaid Other

## 2016-07-13 ENCOUNTER — Other Ambulatory Visit: Payer: Self-pay | Admitting: Student

## 2016-07-13 DIAGNOSIS — I10 Essential (primary) hypertension: Secondary | ICD-10-CM

## 2016-07-13 MED ORDER — HYDROCHLOROTHIAZIDE 12.5 MG PO TABS: 12.5000 mg | ORAL_TABLET | Freq: Every day | ORAL | 0 refills | Status: DC

## 2016-07-13 NOTE — Telephone Encounter (Signed)
Pt needs a refill on BP medication. Pt uses [facility] on [address]. Please advise. Thanks! ep

## 2016-07-19 ENCOUNTER — Ambulatory Visit: Payer: Medicaid Other | Admitting: Student

## 2016-08-24 ENCOUNTER — Other Ambulatory Visit: Payer: Self-pay | Admitting: Student

## 2016-08-24 DIAGNOSIS — I10 Essential (primary) hypertension: Secondary | ICD-10-CM

## 2016-08-24 NOTE — Telephone Encounter (Signed)
Patient calling for hctz refill, states he will call back to schedule an follow up within the next month.

## 2016-10-04 ENCOUNTER — Ambulatory Visit: Payer: Medicaid Other | Admitting: Student

## 2016-10-10 ENCOUNTER — Ambulatory Visit: Payer: Medicaid Other | Admitting: Student

## 2016-10-13 ENCOUNTER — Other Ambulatory Visit: Payer: Self-pay | Admitting: Student

## 2016-10-13 DIAGNOSIS — I10 Essential (primary) hypertension: Secondary | ICD-10-CM

## 2016-10-13 DIAGNOSIS — K219 Gastro-esophageal reflux disease without esophagitis: Secondary | ICD-10-CM

## 2016-10-13 MED ORDER — HYDROCHLOROTHIAZIDE 12.5 MG PO TABS: 12.5000 mg | ORAL_TABLET | Freq: Every day | ORAL | 0 refills | Status: DC

## 2016-10-13 MED ORDER — OMEPRAZOLE 40 MG PO CPDR: 40.0000 mg | DELAYED_RELEASE_CAPSULE | Freq: Every day | ORAL | 3 refills | Status: DC

## 2016-10-13 NOTE — Telephone Encounter (Signed)
Needs refill on blood pressure medicine--HCTZ and acid reflux.   Timothy Hubbard

## 2016-10-19 ENCOUNTER — Other Ambulatory Visit (HOSPITAL_COMMUNITY)
Admission: RE | Admit: 2016-10-19 | Discharge: 2016-10-19 | Disposition: A | Discharge status: Home / Self Care | Payer: Medicaid Other | Source: Ambulatory Visit | Attending: Family Medicine | Admitting: Family Medicine

## 2016-10-19 ENCOUNTER — Encounter: Payer: Self-pay | Admitting: Student

## 2016-10-19 ENCOUNTER — Ambulatory Visit (INDEPENDENT_AMBULATORY_CARE_PROVIDER_SITE_OTHER): Payer: Medicaid Other | Admitting: Student

## 2016-10-19 ENCOUNTER — Ambulatory Visit: Payer: Medicaid Other | Admitting: Student

## 2016-10-19 VITALS — BP 120/80 | HR 87 | Temp 97.8°F | Ht 65.0 in | Wt 163.0 lb

## 2016-10-19 DIAGNOSIS — Z7251 High risk heterosexual behavior: Secondary | ICD-10-CM

## 2016-10-19 DIAGNOSIS — F17200 Nicotine dependence, unspecified, uncomplicated: Secondary | ICD-10-CM

## 2016-10-19 DIAGNOSIS — R300 Dysuria: Secondary | ICD-10-CM | POA: Diagnosis not present

## 2016-10-19 DIAGNOSIS — Z202 Contact with and (suspected) exposure to infections with a predominantly sexual mode of transmission: Secondary | ICD-10-CM

## 2016-10-19 DIAGNOSIS — I10 Essential (primary) hypertension: Secondary | ICD-10-CM

## 2016-10-19 DIAGNOSIS — Z113 Encounter for screening for infections with a predominantly sexual mode of transmission: Secondary | ICD-10-CM | POA: Insufficient documentation

## 2016-10-19 DIAGNOSIS — F1099 Alcohol use, unspecified with unspecified alcohol-induced disorder: Secondary | ICD-10-CM | POA: Diagnosis not present

## 2016-10-19 DIAGNOSIS — IMO0002 Reserved for concepts with insufficient information to code with codable children: Secondary | ICD-10-CM

## 2016-10-19 LAB — BASIC METABOLIC PANEL WITH GFR
BUN: 8 mg/dL (ref 7–25)
CHLORIDE: 105 mmol/L (ref 98–110)
CO2: 24 mmol/L (ref 20–31)
Calcium: 9.4 mg/dL (ref 8.6–10.3)
Creat: 1.03 mg/dL (ref 0.60–1.35)
GFR, Est African American: >89 mL/min (ref >=60)
GFR, Est Non African American: >89 mL/min (ref >=60)
GLUCOSE: 141 mg/dL — AB (ref 65–99)
POTASSIUM: 3.9 mmol/L (ref 3.5–5.3)
Sodium: 140 mmol/L (ref 135–146)

## 2016-10-19 LAB — POCT URINALYSIS DIPSTICK
Blood, UA: NEGATIVE
Glucose, UA: NEGATIVE
KETONES UA: 40
LEUKOCYTES UA: NEGATIVE
NITRITE UA: POSITIVE
PH UA: 7.5
PROTEIN UA: 30
Spec Grav, UA: 1.02
Urobilinogen, UA: 2

## 2016-10-19 NOTE — Patient Instructions (Addendum)
It was great seeing you today! We have addressed the following issues today  1. About your urinary symptom: we have done some tests today. Someone will get in touch with you if any of the test are positive. Otherwise, you will get results by mail. I strongly recommend using protection such as condoms consistently.   2.   Alcohol: I'm worried about your alcohol use. I strongly recommend against drinking more than 16 ounces a day if you cannot stop altogether. I am happy to give you some resources if you feel like you are struggling with this  3.   Smoking: I strongly recommend he quit smoking. Call 1800-QUIT-NOW for help with stopping smoking. There are medications that are helpful when you think it is time to quit. Please let me know.  4.   Hypertension: your blood pressure is 120/80 today. I still recommend taking your hydrochlorothiazide. Please come back and see [place] in 3 months.     If we did any lab work today, and the results require attention, either me or my nurse will get in touch with you. If everything is normal, you will get a letter in mail. If you don't hear from [place] in two weeks, please give [place] a call. Otherwise, we look forward to seeing you again at your next visit. If you have any questions or concerns before then, please call the clinic at [phone].   Please bring all your medications to every doctors visit   Sign up for My Chart to have easy access to your labs results, and communication with your Primary care physician.     Please check-out at the front desk before leaving the clinic.    Take Care,

## 2016-10-19 NOTE — Progress Notes (Signed)
Subjective:    Timothy Hubbard is a 26 y.o. old male here for dysuria  HPI Dysuria: reports having unprotected sex with a male partner three weeks ago. This is the same partner he had for over a year. He says he is not sure if she was treated when he was treated with trichomoniasis about 6 months ago. Denies using protection. He says he was drunk. Reports drinking about 24 oz a day and up to 72 oz on Fridays and Saturdays.  Denies penile discharge, inguinal LAD or skin lesion around private area. Denies blood in urine. Denies increased freq, urgency, flank pain, fever, chills.  Hypertension: didn't take his med today.  Reports good compliance with his HCTZ.  Alcohol use: drinks about 24 oz a day and 72 oz of Friday's and Saturday's. He denies symptoms of withdrawal in the past. Had DUI in the past.  Tobacco use: smookes about 6 cig a day.   PMH/Problem List: has TOBACCO USER; Essential hypertension; Routine screening for STI (sexually transmitted infection); Asthma, mild intermittent; Gastroesophageal reflux disease; Routine adult health maintenance; Unprotected sexual intercourse; Dysuria; and Alcohol use disorder ([place]) on his problem list.   has a past medical history of Asthma.  FH:  Family History  Problem Relation Age of Onset  . Hypertension Mother   . Cancer Maternal Grandfather     doesn't know the type    SH Social History  Substance Use Topics  . Smoking status: Current Every Day Smoker    Packs/day: 1.00    Years: 8.00    Types: Cigarettes    Start date: 2009  . Smokeless tobacco: Never Used  . Alcohol use 1.2 oz/week    2 Cans of beer per week     Comment: social    Review of Systems Review of systems negative except for pertinent positives and negatives in history of present illness above.     Objective:     Vitals:   10/19/16 0922 10/19/16 0950  BP: (!) 140/94 120/80  Pulse: 87   Temp: 97.8 F (36.6 C)   SpO2: 99%   Weight: 163 lb (73.9 kg)   Height: 5'  5" (1.651 m)     Physical Exam GEN: appears well, no apparent distress. Oropharynx: mmm without erythema or exudation HEM: negative for cervical or periauricular lymphadenopathies CVS: RRR, nl S1&S2, no murmurs, no edema RESP: no IWOB, CTAB GI: BS present & normal, soft, NTND GU: no suprapubic or CVA tenderness. Declined further GU exam. MSK: no focal tenderness or notable swelling SKIN: no apparent skin lesion NEURO: alert and oiented appropriately, no gross defecits  PSYCH: euthymic mood with congruent affect    Assessment and Plan:  Dysuria Urine for UA, GC/CT/Trich  Unprotected sexual intercourse HIV/Hep B/RPR and urine for GC/CT/Trich today  Alcohol use disorder ([place]) He appears to be in precontemplation stage. Encouraged moderation (not more than 24 oz of beer in a day) or stopping altogether. Already had DUI. Now engaging in risk behavior such as unprotected sex.   TOBACCO USER Smokes about 6 cigarettes a day. Advised to quit and gave him quit number but he is note ready now.   Essential hypertension Initial BP elevated to 140/94. Repeat BP 120/80. He didn't take his medicine this morning.  -Will continue the HCTZ now. There is a hope that he may be able to come of this medicine.  -BMP today -Follow up in 3 months.      Orders Placed This Encounter  Procedures  .  BASIC METABOLIC PANEL WITH GFR  . RPR  . HIV antibody  . Hepatitis B surface antigen  . Hepatitis B core antibody, IgM  . POCT urinalysis dipstick    Return in about 3 months (around 01/16/2017) for Hypertension.  Mercy Riding, MD 10/21/16 Pager: [phone]

## 2016-10-20 DIAGNOSIS — Z7251 High risk heterosexual behavior: Secondary | ICD-10-CM | POA: Insufficient documentation

## 2016-10-20 LAB — HEPATITIS B CORE ANTIBODY, IGM: HEP B C IGM: NONREACTIVE

## 2016-10-20 LAB — HIV ANTIBODY (ROUTINE TESTING W REFLEX): HIV 1&2 Ab, 4th Generation: NONREACTIVE

## 2016-10-20 LAB — RPR

## 2016-10-20 LAB — HEPATITIS B SURFACE ANTIGEN: Hepatitis B Surface Ag: NEGATIVE

## 2016-10-21 DIAGNOSIS — R300 Dysuria: Secondary | ICD-10-CM | POA: Insufficient documentation

## 2016-10-21 DIAGNOSIS — IMO0002 Reserved for concepts with insufficient information to code with codable children: Secondary | ICD-10-CM | POA: Insufficient documentation

## 2016-10-21 LAB — URINE CYTOLOGY ANCILLARY ONLY
Chlamydia: NEGATIVE
Neisseria Gonorrhea: NEGATIVE
Trichomonas: NEGATIVE

## 2016-10-21 NOTE — Assessment & Plan Note (Signed)
Urine for UA, GC/CT/Trich

## 2016-10-21 NOTE — Assessment & Plan Note (Signed)
HIV/Hep B/RPR and urine for GC/CT/Trich today

## 2016-10-21 NOTE — Assessment & Plan Note (Signed)
He appears to be in precontemplation stage. Encouraged moderation (not more than 24 oz of beer in a day) or stopping altogether. Already had DUI. Now engaging in risk behavior such as unprotected sex.

## 2016-10-21 NOTE — Assessment & Plan Note (Signed)
Smokes about 6 cigarettes a day. Advised to quit and gave him quit number but he is note ready now.

## 2016-10-21 NOTE — Assessment & Plan Note (Addendum)
Initial BP elevated to 140/94. Repeat BP 120/80. He didn't take his medicine this morning.  -Will continue the HCTZ now. There is a hope that he may be able to come of this medicine.  -BMP today -Follow up in 3 months.

## 2016-10-24 ENCOUNTER — Encounter: Payer: Self-pay | Admitting: Student

## 2016-10-24 NOTE — Progress Notes (Signed)
Called and discussed his results from recent office visit. UA with nitrite and 2+ bacteria. He now denies dysuria. His other tests are negative. Sent result letter to patient. Patient is appreciative.

## 2016-11-15 ENCOUNTER — Other Ambulatory Visit: Payer: Self-pay | Admitting: Student

## 2016-11-15 DIAGNOSIS — I10 Essential (primary) hypertension: Secondary | ICD-10-CM

## 2016-11-15 MED ORDER — HYDROCHLOROTHIAZIDE 12.5 MG PO TABS: 12.5000 mg | ORAL_TABLET | Freq: Every day | ORAL | 0 refills | Status: DC

## 2016-11-15 NOTE — Telephone Encounter (Signed)
mom calling to request refill of:  Name of Medication(s):  hydrochlorothiazide Last date of OV:  10-19-16 Pharmacy: [facility]   Will route refill request to Clinic RN.  Discussed with patient policy to call pharmacy for future refills.  Also, discussed refills may take up to 48 hours to approve or deny.  Renella Cunas

## 2016-12-09 ENCOUNTER — Encounter: Payer: Self-pay | Admitting: Family Medicine

## 2016-12-09 ENCOUNTER — Ambulatory Visit (INDEPENDENT_AMBULATORY_CARE_PROVIDER_SITE_OTHER): Payer: Medicaid Other | Admitting: Family Medicine

## 2016-12-09 VITALS — BP 140/80 | HR 100 | Temp 98.2°F | Ht 65.0 in | Wt 161.0 lb

## 2016-12-09 DIAGNOSIS — H1033 Unspecified acute conjunctivitis, bilateral: Secondary | ICD-10-CM

## 2016-12-09 DIAGNOSIS — H1032 Unspecified acute conjunctivitis, left eye: Secondary | ICD-10-CM | POA: Diagnosis not present

## 2016-12-09 DIAGNOSIS — H109 Unspecified conjunctivitis: Secondary | ICD-10-CM | POA: Insufficient documentation

## 2016-12-09 NOTE — Assessment & Plan Note (Signed)
Most likely bacterial conjunctivitis, however concerns for preseptal cellulitis vs orbital cellulitis given reported photophobia. Precepted/examined with attending, Dr. Ardelia Mems.  - pt will go see Dr. Valetta Close, ophthalmology, at 10:30AM, appreciate assistance.

## 2016-12-09 NOTE — Patient Instructions (Signed)
Go to [facility] in [place]. Their phone number is [phone]  You have an appointment with Dr. Valetta Close at 10:30am

## 2016-12-09 NOTE — Progress Notes (Signed)
    Subjective: CC: eye swelling HPI: Patient is a 26 y.o. male presenting to clinic today for a same day appt for eye swelling  His left eye was itching and erythematous 4 days ago.  Used allergy and red eye drops but this didn't work.  2 days ago he started noting white drainage and swelling of the eye lids. He noted that the right eye started becoming affected yesterday. He can't keep his eye open in the sun. Notes blurred vision when outside.  Notes pain with sunlight, having to wear sunglasses. No pain with EOM.    He notes warm compresses typically gets the swelling to go down and the eye to open up, but this didn't help today. No fevers or chills.  No sick contacts.   Social History: current smoker  ROS: All other systems reviewed and are negative.  Past Medical History Patient Active Problem List   Diagnosis Date Noted  . Conjunctivitis 12/09/2016  . Dysuria 10/21/2016  . Alcohol use disorder ([place]) 10/21/2016  . Unprotected sexual intercourse 10/20/2016  . Routine adult health maintenance 06/20/2016  . Gastroesophageal reflux disease 06/13/2016  . Essential hypertension 04/01/2016  . Routine screening for STI (sexually transmitted infection) 04/01/2016  . Asthma, mild intermittent 04/01/2016  . TOBACCO USER 06/13/2009    Medications- reviewed and updated Current Outpatient Prescriptions  Medication Sig Dispense Refill  . albuterol (PROVENTIL HFA;VENTOLIN HFA) 108 (90 Base) MCG/ACT inhaler Inhale 2 puffs into the lungs every 6 (six) hours as needed for wheezing or shortness of breath. 1 Inhaler 0  . hydrochlorothiazide (HYDRODIURIL) 12.5 MG tablet Take 1 tablet (12.5 mg total) by mouth daily. 30 tablet 0  . nicotine (EQ NICOTINE) 14 mg/24hr patch Place 1 patch (14 mg total) onto the skin daily. 28 patch 0  . omeprazole (PRILOSEC) 40 MG capsule Take 1 capsule (40 mg total) by mouth daily. 30 capsule 3   No current facility-administered medications for this visit.      Objective: Office vital signs reviewed. BP 140/80   Pulse 100   Temp 98.2 F (36.8 C) (Oral)   Ht 5\' 5"  (1.651 m)   Wt 161 lb (73 kg)   SpO2 99%   BMI 26.79 kg/m    Physical Examination:  General: Awake, alert, well- nourished, NAD ENMT:  TMs intact, normal light reflex, no erythema, no bulging. Nasal turbinates moist. MMM, Oropharynx clear without erythema or tonsillar exudate/hypertrophy Eyes: Left: Significant swelling of the upper and lower eyelid with mild erythema and watering. Conjunctival injection.  Right: minimal swelling of the eyelids, mild conjunctival injection, mild watering. PERRL. Notes mild irritation with EOM. EOMI. Unable to perform fundoscopic exam.  Cardio: RRR, no m/r/g noted.  Pulm: No increased WOB.  CTAB, without wheezes, rhonchi or crackles noted.    Assessment/Plan: Conjunctivitis Most likely bacterial conjunctivitis, however concerns for preseptal cellulitis vs orbital cellulitis given reported photophobia. Precepted/examined with attending, Dr. Ardelia Mems.  - pt will go see Dr. Valetta Close, ophthalmology, at 10:30AM, appreciate assistance.    Orders Placed This Encounter  Procedures  . Ambulatory referral to Ophthalmology    Referral Priority:   Routine    Referral Type:   Consultation    Referral Reason:   Specialty Services Required    Requested Specialty:   Ophthalmology    Number of Visits Requested:   1    No orders of the defined types were placed in this encounter.   Archie Patten PGY-3, [place]

## 2016-12-28 ENCOUNTER — Telehealth: Payer: Self-pay | Admitting: Student

## 2016-12-28 DIAGNOSIS — I10 Essential (primary) hypertension: Secondary | ICD-10-CM

## 2016-12-28 NOTE — Telephone Encounter (Signed)
Pt is calling for a refill on his BP medication to be called in. Also the pharmacy is requesting to be sent in 90 day qty for insurance purposes. jw

## 2016-12-29 MED ORDER — HYDROCHLOROTHIAZIDE 12.5 MG PO TABS: 12.5000 mg | ORAL_TABLET | Freq: Every day | ORAL | 2 refills | Status: DC

## 2016-12-29 NOTE — Telephone Encounter (Signed)
Refill hydrochlorothiazide 12.5 mg 90 day supply with 2 refills

## 2017-02-20 ENCOUNTER — Other Ambulatory Visit: Payer: Self-pay | Admitting: Student

## 2017-02-20 DIAGNOSIS — R0602 Shortness of breath: Secondary | ICD-10-CM

## 2017-02-20 NOTE — Telephone Encounter (Signed)
Mother is calling for her son, to get a refill on her albuterol called in. jw

## 2017-02-20 NOTE — Telephone Encounter (Signed)
Rx electronically sent

## 2017-02-21 NOTE — Telephone Encounter (Signed)
Called pt. Voicemail not set up. If pt calls, please let him know Rx for albuterol has been sent. Ottis Stain, CMA

## 2017-03-01 ENCOUNTER — Other Ambulatory Visit: Payer: Self-pay | Admitting: Student

## 2017-03-01 DIAGNOSIS — K219 Gastro-esophageal reflux disease without esophagitis: Secondary | ICD-10-CM

## 2017-03-01 MED ORDER — RANITIDINE HCL 150 MG PO CAPS: 150.0000 mg | ORAL_CAPSULE | Freq: Two times a day (BID) | ORAL | 3 refills | Status: DC

## 2017-03-02 ENCOUNTER — Telehealth: Payer: Self-pay | Admitting: Student

## 2017-03-02 NOTE — Telephone Encounter (Signed)
Mom/girlfriend wanted to know why pt was switched to Zantac. Please advise. ep

## 2017-03-02 NOTE — Telephone Encounter (Signed)
Called patient to see what his concerns were about omprezol being changed to ranitidine.  The patient informed me that he had not called and that perhaps it was his mother,  He said he would get to the bottom of it and call back if there was an issue with the meds.Timothy Hubbard

## 2017-03-03 NOTE — Telephone Encounter (Signed)
Changed his PPI to Zantac. Long term use of PPI can increase the risk of kidney injury, GI infection, pneumonia and bone demineralization. Called and discussed this with the patient. Patient voiced understanding. If his symptoms persist with Zantac, patient to schedule an office visit for evaluation.

## 2017-03-22 ENCOUNTER — Ambulatory Visit (INDEPENDENT_AMBULATORY_CARE_PROVIDER_SITE_OTHER): Payer: Medicaid Other | Admitting: Family Medicine

## 2017-03-22 ENCOUNTER — Encounter: Payer: Self-pay | Admitting: Family Medicine

## 2017-03-22 VITALS — BP 130/92 | HR 74 | Temp 98.2°F | Ht 65.0 in | Wt 153.6 lb

## 2017-03-22 DIAGNOSIS — M79641 Pain in right hand: Secondary | ICD-10-CM | POA: Diagnosis present
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
## 2017-03-22 NOTE — Patient Instructions (Signed)
Thank you for coming in today, it was so nice to see you! Today we talked about:    Right hand pain: I would like for you to get a hand x-ray to rule out a fracture. If there is a fracture, I will call you and we will have to either place a referral to the orthopedic surgeon or a hand specialist. He can go to [facility] to get this x-ray or [facility] imaging.  If you have any questions or concerns, please do not hesitate to call the office at [phone]. You can also message me directly via MyChart.   Sincerely,  Smitty Cords, MD

## 2017-03-29 ENCOUNTER — Other Ambulatory Visit: Payer: Self-pay | Admitting: *Deleted

## 2017-03-29 DIAGNOSIS — K219 Gastro-esophageal reflux disease without esophagitis: Secondary | ICD-10-CM

## 2017-03-30 NOTE — Telephone Encounter (Signed)
Called patient to clarify about this refill request. Patient picked up the phone and hang up. When I called him again, he didn't pick the phone. He has no VM either. Last time I talked to him, we changed his acid reflux medicine to Ranitidine. Now, I got refill request on Prilosec from his pharmacy. I wonder if the patient requested this. He shouldn't be taking both together. Can someone try calling him again! Thanks! Bretta Bang

## 2017-03-31 ENCOUNTER — Other Ambulatory Visit: Payer: Self-pay | Admitting: Student

## 2017-03-31 DIAGNOSIS — K219 Gastro-esophageal reflux disease without esophagitis: Secondary | ICD-10-CM

## 2017-03-31 NOTE — Telephone Encounter (Signed)
Pt informed. Sharon T Saunders, CMA  

## 2017-03-31 NOTE — Telephone Encounter (Signed)
Tried to call him multiple times without success. See my telephone notes. It seems my RN has managed to talk to him later. I don't know why I have to call his mother. Regardless, I have refilled his Prilosec. Thanks!

## 2017-03-31 NOTE — Telephone Encounter (Signed)
Mother called and said that they are waiting to hear back about patient's medication.  Informed patient/mother that provider has 48 hours to respond to message and it may be Monday before they hear anything. Jazmin Hartsell,CMA

## 2017-03-31 NOTE — Telephone Encounter (Signed)
Pt is returning Dr. Juliann Pares call. He said that he asked the pharmacy to request Prilosec since Ranitidine is not working for him. Can we please call that in. jw

## 2017-05-08 ENCOUNTER — Other Ambulatory Visit: Payer: Self-pay | Admitting: Student

## 2017-05-08 DIAGNOSIS — K219 Gastro-esophageal reflux disease without esophagitis: Secondary | ICD-10-CM

## 2017-05-09 NOTE — Telephone Encounter (Signed)
Mother called and said that patient needs acid reflux med and that she called the pharmacy and they told her that no more refills were available.Ozella Almond

## 2017-05-22 ENCOUNTER — Encounter: Payer: Self-pay | Admitting: Internal Medicine

## 2017-05-22 ENCOUNTER — Telehealth: Payer: Self-pay | Admitting: Student

## 2017-05-22 ENCOUNTER — Ambulatory Visit (INDEPENDENT_AMBULATORY_CARE_PROVIDER_SITE_OTHER): Payer: Medicaid Other | Admitting: Internal Medicine

## 2017-05-22 VITALS — BP 130/98 | HR 100 | Ht 65.0 in | Wt 156.2 lb

## 2017-05-22 DIAGNOSIS — M79641 Pain in right hand: Secondary | ICD-10-CM | POA: Diagnosis present

## 2017-05-22 NOTE — Telephone Encounter (Signed)
Attempted to call patient for permission to talk to his mother. He didn't pick up his phone nor has voice mail to leave a message. Attempted to call patient's mother on her phone. She didn't pick up.  Can't leave voice mail or talk to her without patient's permission.
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
## 2017-05-22 NOTE — Telephone Encounter (Signed)
Mother wants to talk to dr [name] about the news dr gave pt on Friday

## 2017-05-22 NOTE — Assessment & Plan Note (Signed)
-   Patient with re-injury to previously fractured hand. Persistent swelling and point TTP over previous injury site of 4th metatarsal neck but no imaging to assess current alignment. Declines imaging or referral to hand surgery. Does not describe numbness or tingling to suggest nerve injury. - Recommended imaging and/or referral to hand surgery, but patient declines. Recommended continuing ibuprofen and tylenol as needed for pain, icing, and trying hand exercises for range of motion. Offered topical treatment options of voltaren gel and capsaicin cream to add to PO medication, but patient became frustrated and wanted "something stronger."  - If patient changes his mind, I will gladly refer to hand surgery. Would not prescribe opioid pain medication, as patient not currently addressing underlying injury.

## 2017-05-24 ENCOUNTER — Telehealth: Payer: Self-pay

## 2017-05-24 NOTE — Telephone Encounter (Signed)
Attempted to call patient for permission to talk to his mother. He didn't pick up his phone nor has voice mail to leave a message. Attempted to call patient's mother on her phone. She didn't pick up. I can't leave voice mail or talk to her without patient's permission because patient has written DPR not to share his information with anyone else.   Few minutes later, Mrs. Timothy Hubbard, patient's mother called back asking for what has been discussed at his last encounter with Dr. Ola Spurr. She says her son is terrified since then. She also states [name] has history of bipolar disorder and schizophrenia that I am not aware of. I asked her if he is available to talk to first. She said no. Then I told her that [name] has designated party release form signed that doesn't allow me to share his medical information with anyone else without his permission. She said, "should I sue you?". I told her "I am sorry you felt that way but I am required to honor  his wish as his provider and won't be able to share his information with you without his permission". I advised her to come in with him for next visit if she wish to get more information about his condition.  She states calling in the morning again to talk to the clinic supervisor about this. She says "I can't do shit if I don't what is going on with him" and hang up the phone.   Please see Dr. Blane Ohara note for his recent encounter.

## 2017-05-24 NOTE — Telephone Encounter (Signed)
Patient's mother called and said that she wants to speak to Dr. [name] because something was said to her son by the doctor and it has terrified him. Her name is Geni Bers [phone]. She seems determined to find out what is going on.Ozella Almond

## 2017-06-06 ENCOUNTER — Telehealth: Payer: Self-pay | Admitting: Student

## 2017-06-06 NOTE — Telephone Encounter (Signed)
Mother is calling for her son since he needs a refill on his Prilosec to be called in. jw

## 2017-06-07 ENCOUNTER — Other Ambulatory Visit: Payer: Self-pay | Admitting: Student

## 2017-06-07 DIAGNOSIS — K219 Gastro-esophageal reflux disease without esophagitis: Secondary | ICD-10-CM

## 2017-06-07 NOTE — Telephone Encounter (Signed)
Called to talk to patient about the refill on Prilosec requested by his mother. Patient picked up the phone and hang up after I introduced myself. I don't think there is an indication for chronic PPI (Prilosec). I am not allowed to discuss his medical information with his mother. He has a DPR form on file.  Thanks! Bretta Bang

## 2017-07-10 ENCOUNTER — Other Ambulatory Visit: Payer: Self-pay | Admitting: *Deleted

## 2017-07-10 DIAGNOSIS — K219 Gastro-esophageal reflux disease without esophagitis: Secondary | ICD-10-CM

## 2017-07-10 MED ORDER — OMEPRAZOLE 40 MG PO CPDR: DELAYED_RELEASE_CAPSULE | ORAL | 0 refills | Status: DC

## 2017-07-10 NOTE — Telephone Encounter (Signed)
Patient mother calling requesting refill on prilosec for patient.

## 2017-08-17 ENCOUNTER — Other Ambulatory Visit: Payer: Self-pay | Admitting: *Deleted

## 2017-08-17 ENCOUNTER — Other Ambulatory Visit: Payer: Self-pay | Admitting: Student

## 2017-08-17 DIAGNOSIS — K219 Gastro-esophageal reflux disease without esophagitis: Secondary | ICD-10-CM

## 2017-08-17 NOTE — Telephone Encounter (Signed)
Patient's mom left message on nurse line requesting refill on omeprazole. LM on VM asking which pharmacy she would like this sent to. Hubbard Hartshorn, RN, BSN

## 2017-08-18 NOTE — Telephone Encounter (Signed)
Pharmacy is [facility] on [address]

## 2017-09-07 ENCOUNTER — Telehealth: Payer: Self-pay | Admitting: Student

## 2017-09-07 DIAGNOSIS — I10 Essential (primary) hypertension: Secondary | ICD-10-CM

## 2017-09-07 MED ORDER — HYDROCHLOROTHIAZIDE 12.5 MG PO TABS: 12.5000 mg | ORAL_TABLET | Freq: Every day | ORAL | 2 refills | Status: DC

## 2017-09-07 NOTE — Telephone Encounter (Signed)
Pt needs refill on his bp pills sent to [facility] on [address]. Please advise

## 2017-09-07 NOTE — Telephone Encounter (Signed)
Refilled patient's hydrochlorothiazide.  Since Rx to requested pharmacy.

## 2017-10-09 ENCOUNTER — Other Ambulatory Visit: Payer: Self-pay | Admitting: Student

## 2017-10-09 DIAGNOSIS — K219 Gastro-esophageal reflux disease without esophagitis: Secondary | ICD-10-CM

## 2017-10-09 MED ORDER — OMEPRAZOLE 20 MG PO CPDR: 20.0000 mg | DELAYED_RELEASE_CAPSULE | Freq: Every day | ORAL | 3 refills | Status: DC

## 2017-11-19 ENCOUNTER — Encounter (HOSPITAL_COMMUNITY): Payer: Self-pay | Admitting: Emergency Medicine

## 2017-11-19 ENCOUNTER — Emergency Department (HOSPITAL_COMMUNITY): Payer: Medicaid Other

## 2017-11-19 ENCOUNTER — Emergency Department (HOSPITAL_COMMUNITY)
Admission: EM | Admit: 2017-11-19 | Discharge: 2017-11-19 | Disposition: A | Discharge status: Home / Self Care | Payer: Medicaid Other | Attending: Emergency Medicine | Admitting: Emergency Medicine

## 2017-11-19 DIAGNOSIS — J45909 Unspecified asthma, uncomplicated: Secondary | ICD-10-CM | POA: Insufficient documentation

## 2017-11-19 DIAGNOSIS — M25562 Pain in left knee: Secondary | ICD-10-CM | POA: Insufficient documentation

## 2017-11-19 DIAGNOSIS — F17210 Nicotine dependence, cigarettes, uncomplicated: Secondary | ICD-10-CM | POA: Insufficient documentation

## 2017-11-19 DIAGNOSIS — I10 Essential (primary) hypertension: Secondary | ICD-10-CM | POA: Diagnosis not present

## 2017-11-19 DIAGNOSIS — M546 Pain in thoracic spine: Secondary | ICD-10-CM | POA: Diagnosis not present

## 2017-11-19 DIAGNOSIS — M545 Low back pain: Secondary | ICD-10-CM | POA: Diagnosis present

## 2017-11-19 DIAGNOSIS — Z79899 Other long term (current) drug therapy: Secondary | ICD-10-CM | POA: Insufficient documentation

## 2017-11-19 MED ORDER — IBUPROFEN 400 MG PO TABS: 400.0000 mg | ORAL_TABLET | Freq: Four times a day (QID) | ORAL | 0 refills | Status: DC | PRN

## 2017-11-19 MED ORDER — ACETAMINOPHEN 325 MG PO TABS: 650.0000 mg | ORAL_TABLET | Freq: Four times a day (QID) | ORAL | 0 refills | Status: DC | PRN

## 2017-11-19 MED ORDER — IBUPROFEN 800 MG PO TABS
800.0000 mg | ORAL_TABLET | Freq: Once | ORAL | Status: AC
  Administered 2017-11-19: 800 mg via ORAL
  Filled 2017-11-19: qty 1

## 2017-11-19 MED ORDER — ACETAMINOPHEN 325 MG PO TABS
650.0000 mg | ORAL_TABLET | Freq: Once | ORAL | Status: AC
  Administered 2017-11-19: 650 mg via ORAL
  Filled 2017-11-19: qty 2
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
## 2017-11-19 NOTE — Discharge Instructions (Signed)
You may alternate taking Tylenol and Ibuprofen as needed for pain control. You may take 400-600 mg of ibuprofen every 6 hours and [phone] mg of Tylenol every 6 hours. Do not exceed 4000 mg of Tylenol daily as this can lead to liver damage. Also, make sure to take Ibuprofen with meals as it can cause an upset stomach. Do not take other NSAIDs while taking Ibuprofen such as (Aleve, Naprosyn, Aspirin, Celebrex, etc) and do not take more than the prescribed dose as this can lead to ulcers and bleeding in your GI tract. You may use warm and cold compresses to help with your symptoms.   Please follow up with your primary doctor within the next 7-10 days for re-evaluation and further treatment of your symptoms.   Return to the emergency department immediately if you experience any loss of control of your bowels/bladder, weakness/numbness to your legs, numbness to your groin area, inability to walk, or inability to urinate.
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
## 2018-02-16 ENCOUNTER — Other Ambulatory Visit: Payer: Self-pay | Admitting: Student

## 2018-02-16 DIAGNOSIS — K219 Gastro-esophageal reflux disease without esophagitis: Secondary | ICD-10-CM

## 2018-02-16 NOTE — Telephone Encounter (Signed)
Message on nurse line asking to change to 40 mg if possible.  Call back is [phone]  Danley Danker, RN [facility] [place])

## 2018-05-03 ENCOUNTER — Telehealth: Payer: Self-pay | Admitting: Family Medicine

## 2018-05-03 NOTE — Telephone Encounter (Signed)
LMOVM for mom to call back.  Pt will need an appt first.  His last appt was 05/2017. Fleeger, Salome Spotted, CMA

## 2018-05-03 NOTE — Telephone Encounter (Signed)
Pt mother called and wants to know what she needs to do about getting pt another breathing machine for his Asthma. She said he has had the same one for years and it is no longer working. PT mother would like for someone to call her at [phone].

## 2018-05-04 ENCOUNTER — Other Ambulatory Visit: Payer: Self-pay | Admitting: Family Medicine

## 2018-05-04 DIAGNOSIS — R0602 Shortness of breath: Secondary | ICD-10-CM

## 2018-05-04 MED ORDER — ALBUTEROL SULFATE HFA 108 (90 BASE) MCG/ACT IN AERS: INHALATION_SPRAY | RESPIRATORY_TRACT | 0 refills | Status: DC

## 2018-05-04 NOTE — Telephone Encounter (Signed)
Spoke with mother. Made appt for 9/4 in the AM; tried to schedule in the PM with PCP but patient is in a program in the afternoons.  Stated his Proair does not seem to be helping and wonders if a different inhaler can be sent in to help him until appt.  Call back is [phone]  Danley Danker, RN [facility])

## 2018-05-04 NOTE — Telephone Encounter (Signed)
I am sending a new prescription for albuterol since his old inhaler may have run out of medication.  We can talk about other medications to try at his next appointment.

## 2018-05-08 ENCOUNTER — Ambulatory Visit: Payer: Medicaid Other

## 2018-05-08 ENCOUNTER — Telehealth: Payer: Self-pay | Admitting: Family Medicine
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
## 2018-05-08 NOTE — Telephone Encounter (Signed)
Asthma inhaler that she just picked up does not work [employer]).  Please call her back.

## 2018-05-08 NOTE — Telephone Encounter (Signed)
LVM for a return to call. If pt calls, please give information below. Ottis Stain, CMA

## 2018-05-09 ENCOUNTER — Ambulatory Visit: Payer: Medicaid Other

## 2018-05-09 NOTE — Telephone Encounter (Signed)
Spoke with patient regarding his breathing difficulty.  He missed his appointment this morning because he was working, and he says his mother made that appointment without his knowledge.  He also says that he has not yet been able to pick up his new inhaler from the pharmacy.  I encouraged him to make an appointment that he knows he'll be able to keep sometime this week and to pick up that medication.  He may benefit from an additional inhaler, but he needs to be seen in order for that to be assessed.  I also asked him to communicate this to his mother, since she has been the person calling our office with concerns.  He was agreeable to this plan.

## 2018-06-06 ENCOUNTER — Other Ambulatory Visit: Payer: Self-pay | Admitting: Family Medicine

## 2018-06-06 DIAGNOSIS — R0602 Shortness of breath: Secondary | ICD-10-CM

## 2018-06-06 NOTE — Telephone Encounter (Signed)
I will refill the Proair, but I will not prescribe any other inhaler until he comes in for an appointment.

## 2018-06-06 NOTE — Telephone Encounter (Signed)
Pts mother called nurse line about sons inhaler. I informed mom he needs to be seen in clinic, as he may benefit from a second inhaler, and he has no showed two apts last month. Mom stated, "he is a busy man doing other things." Mom declined to make an apt at this time for him. Please advise.

## 2018-07-11 ENCOUNTER — Other Ambulatory Visit: Payer: Self-pay | Admitting: Family Medicine

## 2018-07-11 DIAGNOSIS — R0602 Shortness of breath: Secondary | ICD-10-CM

## 2018-08-05 ENCOUNTER — Other Ambulatory Visit: Payer: Self-pay | Admitting: Family Medicine

## 2018-08-05 DIAGNOSIS — R0602 Shortness of breath: Secondary | ICD-10-CM

## 2018-08-06 ENCOUNTER — Other Ambulatory Visit: Payer: Self-pay | Admitting: *Deleted

## 2018-08-06 DIAGNOSIS — K219 Gastro-esophageal reflux disease without esophagitis: Secondary | ICD-10-CM

## 2018-08-07 MED ORDER — OMEPRAZOLE 20 MG PO CPDR: DELAYED_RELEASE_CAPSULE | ORAL | 5 refills | Status: DC

## 2018-09-14 ENCOUNTER — Other Ambulatory Visit: Payer: Self-pay

## 2018-09-14 DIAGNOSIS — I10 Essential (primary) hypertension: Secondary | ICD-10-CM

## 2018-09-14 NOTE — Telephone Encounter (Signed)
To white team.

## 2018-09-18 ENCOUNTER — Other Ambulatory Visit: Payer: Self-pay | Admitting: Family Medicine

## 2018-09-18 DIAGNOSIS — I10 Essential (primary) hypertension: Secondary | ICD-10-CM

## 2018-09-19 ENCOUNTER — Telehealth: Payer: Self-pay | Admitting: *Deleted

## 2018-09-19 ENCOUNTER — Other Ambulatory Visit: Payer: Self-pay | Admitting: Family Medicine

## 2018-09-19 DIAGNOSIS — I10 Essential (primary) hypertension: Secondary | ICD-10-CM

## 2018-09-19 NOTE — Telephone Encounter (Signed)
Left detailed message informing pt of below and requesting him to call and schedule an appointment. Please assist in getting this scheduled. [name], [place] D, [name]

## 2018-09-19 NOTE — Telephone Encounter (Signed)
-----   Message from Kathrene Alu, MD sent at 09/19/2018  8:15 AM EST ----- Regarding: patient needs appt I got a refill request for Mr. Glockner's HCTZ, but he needs to be seen and get a BMP before I prescribe him more of this.  I have given him a month's supply to give him time to make an appointment.  If you would call him and schedule this, I'd appreciate it.

## 2018-10-04 NOTE — Telephone Encounter (Signed)
Informed pt of below and he wanted to know why he needed to come in and I informed him that he needed labs done in order to continue the medicine and he stated that he wasn't in [place] right now and that his mother sends him his medicine.  I told him he would not be getting anymore refills until he has his labs drawn and then he disconnected the call.  Routing to PCP as an Pharmacist, hospital. [name], April D, [name]

## 2019-01-22 ENCOUNTER — Emergency Department (HOSPITAL_COMMUNITY)
Admission: EM | Admit: 2019-01-22 | Discharge: 2019-01-22 | Disposition: A | Discharge status: Home / Self Care | Payer: Medicaid Other | Attending: Emergency Medicine | Admitting: Emergency Medicine

## 2019-01-22 DIAGNOSIS — Y999 Unspecified external cause status: Secondary | ICD-10-CM | POA: Insufficient documentation

## 2019-01-22 DIAGNOSIS — S3992XA Unspecified injury of lower back, initial encounter: Secondary | ICD-10-CM | POA: Diagnosis present

## 2019-01-22 DIAGNOSIS — Z79899 Other long term (current) drug therapy: Secondary | ICD-10-CM | POA: Diagnosis not present

## 2019-01-22 DIAGNOSIS — J45909 Unspecified asthma, uncomplicated: Secondary | ICD-10-CM | POA: Diagnosis not present

## 2019-01-22 DIAGNOSIS — F17210 Nicotine dependence, cigarettes, uncomplicated: Secondary | ICD-10-CM | POA: Diagnosis not present

## 2019-01-22 DIAGNOSIS — Y939 Activity, unspecified: Secondary | ICD-10-CM | POA: Insufficient documentation

## 2019-01-22 DIAGNOSIS — Y929 Unspecified place or not applicable: Secondary | ICD-10-CM | POA: Insufficient documentation

## 2019-01-22 DIAGNOSIS — S39012A Strain of muscle, fascia and tendon of lower back, initial encounter: Secondary | ICD-10-CM

## 2019-01-22 MED ORDER — CYCLOBENZAPRINE HCL 10 MG PO TABS: 10.0000 mg | ORAL_TABLET | Freq: Two times a day (BID) | ORAL | 0 refills | Status: DC | PRN

## 2019-01-22 MED ORDER — IBUPROFEN 800 MG PO TABS: 800.0000 mg | ORAL_TABLET | Freq: Three times a day (TID) | ORAL | 0 refills | Status: DC | PRN
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
## 2019-04-19 DIAGNOSIS — Z5181 Encounter for therapeutic drug level monitoring: Secondary | ICD-10-CM | POA: Diagnosis not present

## 2019-04-26 DIAGNOSIS — Z5181 Encounter for therapeutic drug level monitoring: Secondary | ICD-10-CM | POA: Diagnosis not present

## 2019-05-01 DIAGNOSIS — Z5181 Encounter for therapeutic drug level monitoring: Secondary | ICD-10-CM | POA: Diagnosis not present

## 2019-05-07 DIAGNOSIS — Z5181 Encounter for therapeutic drug level monitoring: Secondary | ICD-10-CM | POA: Diagnosis not present

## 2019-05-15 DIAGNOSIS — Z5181 Encounter for therapeutic drug level monitoring: Secondary | ICD-10-CM | POA: Diagnosis not present

## 2019-05-22 DIAGNOSIS — Z5181 Encounter for therapeutic drug level monitoring: Secondary | ICD-10-CM | POA: Diagnosis not present

## 2019-05-29 DIAGNOSIS — Z5181 Encounter for therapeutic drug level monitoring: Secondary | ICD-10-CM | POA: Diagnosis not present

## 2019-06-04 DIAGNOSIS — Z5181 Encounter for therapeutic drug level monitoring: Secondary | ICD-10-CM | POA: Diagnosis not present

## 2019-06-12 DIAGNOSIS — Z5181 Encounter for therapeutic drug level monitoring: Secondary | ICD-10-CM | POA: Diagnosis not present

## 2019-06-19 DIAGNOSIS — Z5181 Encounter for therapeutic drug level monitoring: Secondary | ICD-10-CM | POA: Diagnosis not present

## 2019-06-26 DIAGNOSIS — Z5181 Encounter for therapeutic drug level monitoring: Secondary | ICD-10-CM | POA: Diagnosis not present

## 2019-07-02 DIAGNOSIS — Z5181 Encounter for therapeutic drug level monitoring: Secondary | ICD-10-CM | POA: Diagnosis not present

## 2019-07-09 DIAGNOSIS — Z5181 Encounter for therapeutic drug level monitoring: Secondary | ICD-10-CM | POA: Diagnosis not present

## 2019-07-15 DIAGNOSIS — Z5181 Encounter for therapeutic drug level monitoring: Secondary | ICD-10-CM | POA: Diagnosis not present

## 2019-07-22 DIAGNOSIS — Z5181 Encounter for therapeutic drug level monitoring: Secondary | ICD-10-CM | POA: Diagnosis not present

## 2019-07-29 DIAGNOSIS — Z5181 Encounter for therapeutic drug level monitoring: Secondary | ICD-10-CM | POA: Diagnosis not present

## 2019-08-05 DIAGNOSIS — Z5181 Encounter for therapeutic drug level monitoring: Secondary | ICD-10-CM | POA: Diagnosis not present

## 2019-08-12 DIAGNOSIS — Z5181 Encounter for therapeutic drug level monitoring: Secondary | ICD-10-CM | POA: Diagnosis not present

## 2019-08-19 DIAGNOSIS — Z5181 Encounter for therapeutic drug level monitoring: Secondary | ICD-10-CM | POA: Diagnosis not present

## 2019-08-26 DIAGNOSIS — Z5181 Encounter for therapeutic drug level monitoring: Secondary | ICD-10-CM | POA: Diagnosis not present

## 2019-09-02 DIAGNOSIS — Z5181 Encounter for therapeutic drug level monitoring: Secondary | ICD-10-CM | POA: Diagnosis not present

## 2019-09-16 DIAGNOSIS — Z5181 Encounter for therapeutic drug level monitoring: Secondary | ICD-10-CM | POA: Diagnosis not present

## 2019-09-23 DIAGNOSIS — Z5181 Encounter for therapeutic drug level monitoring: Secondary | ICD-10-CM | POA: Diagnosis not present

## 2019-09-30 DIAGNOSIS — Z5181 Encounter for therapeutic drug level monitoring: Secondary | ICD-10-CM | POA: Diagnosis not present

## 2020-02-04 ENCOUNTER — Other Ambulatory Visit: Payer: Self-pay

## 2020-02-04 ENCOUNTER — Encounter: Payer: Self-pay | Admitting: Family Medicine

## 2020-02-04 ENCOUNTER — Ambulatory Visit (INDEPENDENT_AMBULATORY_CARE_PROVIDER_SITE_OTHER): Payer: Medicaid Other | Admitting: Family Medicine

## 2020-02-04 VITALS — BP 138/100 | HR 105 | Ht 65.0 in | Wt 147.6 lb

## 2020-02-04 DIAGNOSIS — I10 Essential (primary) hypertension: Secondary | ICD-10-CM

## 2020-02-04 DIAGNOSIS — R0602 Shortness of breath: Secondary | ICD-10-CM | POA: Diagnosis not present

## 2020-02-04 DIAGNOSIS — K219 Gastro-esophageal reflux disease without esophagitis: Secondary | ICD-10-CM

## 2020-02-04 DIAGNOSIS — J4520 Mild intermittent asthma, uncomplicated: Secondary | ICD-10-CM

## 2020-02-04 DIAGNOSIS — F17200 Nicotine dependence, unspecified, uncomplicated: Secondary | ICD-10-CM | POA: Diagnosis not present

## 2020-02-04 MED ORDER — FAMOTIDINE 20 MG PO TABS: 20.0000 mg | ORAL_TABLET | Freq: Two times a day (BID) | ORAL | 3 refills | Status: DC

## 2020-02-04 MED ORDER — PROAIR HFA 108 (90 BASE) MCG/ACT IN AERS: INHALATION_SPRAY | RESPIRATORY_TRACT | 2 refills | Status: DC

## 2020-02-04 MED ORDER — AMLODIPINE BESYLATE 10 MG PO TABS: 10.0000 mg | ORAL_TABLET | Freq: Every day | ORAL | 3 refills | Status: DC

## 2020-02-04 MED ORDER — OMEPRAZOLE 20 MG PO CPDR: DELAYED_RELEASE_CAPSULE | ORAL | 1 refills | Status: DC

## 2020-02-04 NOTE — Patient Instructions (Addendum)
It was nice meeting you today Timothy Hubbard!  For your blood pressure, we are starting a medicine called amlodipine, which she will need to take once per day.  It is very important that we keep your blood pressure well controlled since this can cause harm to your heart and kidneys in the future.  Please continue to work on quitting smoking.  It is the single best thing you can do for your health.  We have a tobacco cessation clinic here that can help you in this process, and you can also call the 1 800-QUIT-NOW number, which can give you nicotine supplies and free support.  For your acid reflux, we will start Pepcid, which you can take when you have symptoms or every day, and I have also sent in Prilosec, which you will need to take once per day.  Please try to get off of this medication in 6 months if you can.  Please return in about 1 week for a nurse visit will we will check your blood pressure to make sure that the amlodipine is working well.  If you have any questions or concerns, please feel free to call the clinic.   Be well,  Dr. Shan Levans

## 2020-02-04 NOTE — Assessment & Plan Note (Signed)
Prescribed Pepcid as well as Prilosec and counseled on using the Prilosec every day but not using it for more than a few months at a time.  Advised him that Pepcid can be taken for a longer term and can be taken as needed.

## 2020-02-04 NOTE — Progress Notes (Signed)
    SUBJECTIVE:   CHIEF COMPLAINT / HPI:   High blood pressure Patient reports that this has been an ongoing problem for him.  He has been on HCTZ in the past, but he has not taken any blood pressure medications for over 1 year because he has been in [place].  He denies blurry vision, headaches, chest pain.  He says that his mother also has hypertension.  Intermittent asthma Patient reports that he gets short of breath where he needs his albuterol inhaler about 2 or 3 times per month.  This has been stable over the last few years.  He denies frequent nighttime cough.  Acid reflux Patient reports that he has pain in his upper chest after eating.  This is moderately well controlled with as needed Prilosec.  He describes the pain as burning and intermittent and not associated with activity.  Tobacco use Patient reports that he is smoking about 6 or 7 cigarettes/day and is using vaping to help him quit.  Reports that he is sexually active with women only.  He says that he was tested for STIs recently and does not need to do this today.  His alcohol use is about 3 beers per weekend.  PERTINENT  PMH / PSH: Hypertension, tobacco use disorder, asthma  OBJECTIVE:   BP (!) 138/100   Pulse (!) 105   Ht 5\' 5"  (1.651 m)   Wt 147 lb 9.6 oz (67 kg)   SpO2 100%   BMI 24.56 kg/m   General: well appearing, appears stated age Cardiac: Mildly tachycardic, no MRG Respiratory: CTAB, no rhonchi, rales, or wheezing, normal work of breathing Skin: no rashes or other lesions, warm and well perfused Psych: appropriate mood and affect   ASSESSMENT/PLAN:   Essential hypertension Uncontrolled today at 138/100.  We will start amlodipine 10 mg daily and have patient return for a nurse visit in 1 week to ensure that this medication is working well for him.  Discussed the ramifications of poorly controlled blood pressure with the patient.  Will get a BMP today due to his longstanding high blood  pressure.  Asthma, mild intermittent Refilled albuterol inhaler today.  Counseled on smoking cessation.  Gastroesophageal reflux disease Prescribed Pepcid as well as Prilosec and counseled on using the Prilosec every day but not using it for more than a few months at a time.  Advised him that Pepcid can be taken for a longer term and can be taken as needed.  TOBACCO USER Congratulated patient on his attempts to quit smoking, and also informed him of our tobacco cessation clinic here as well as the 1 800 QUIT NOW support line.  Also counseled on the proper way to use Nicorette gum and told him that quitting smoking now is the best thing that he can do for his health.     Kathrene Alu, MD [place]

## 2020-02-04 NOTE — Assessment & Plan Note (Signed)
Congratulated patient on his attempts to quit smoking, and also informed him of our tobacco cessation clinic here as well as the 1 [place] support line.  Also counseled on the proper way to use Nicorette gum and told him that quitting smoking now is the best thing that he can do for his health.

## 2020-02-04 NOTE — Assessment & Plan Note (Addendum)
Refilled albuterol inhaler today.  Counseled on smoking cessation.

## 2020-02-04 NOTE — Assessment & Plan Note (Addendum)
Uncontrolled today at 138/100.  We will start amlodipine 10 mg daily and have patient return for a nurse visit in 1 week to ensure that this medication is working well for him.  Discussed the ramifications of poorly controlled blood pressure with the patient.  Will get a BMP today due to his longstanding high blood pressure.

## 2020-02-05 ENCOUNTER — Encounter: Payer: Self-pay | Admitting: Family Medicine

## 2020-02-05 LAB — BASIC METABOLIC PANEL
BUN/Creatinine Ratio: 10 (ref 9–20)
BUN: 8 mg/dL (ref 6–20)
CO2: 18 mmol/L — ABNORMAL LOW (ref 20–29)
Calcium: 8.8 mg/dL (ref 8.7–10.2)
Chloride: 106 mmol/L (ref 96–106)
Creatinine, Ser: 0.81 mg/dL (ref 0.76–1.27)
GFR calc Af Amer: 138 mL/min/{1.73_m2} (ref >59)
GFR calc non Af Amer: 119 mL/min/{1.73_m2} (ref >59)
Glucose: 87 mg/dL (ref 65–99)
Potassium: 4.5 mmol/L (ref 3.5–5.2)
Sodium: 142 mmol/L (ref 134–144)

## 2020-02-11 ENCOUNTER — Ambulatory Visit: Payer: Medicaid Other

## 2020-03-03 ENCOUNTER — Ambulatory Visit: Payer: Medicaid Other | Admitting: Family Medicine

## 2020-03-11 ENCOUNTER — Ambulatory Visit: Payer: Medicaid Other | Admitting: Family Medicine

## 2020-03-12 ENCOUNTER — Ambulatory Visit: Payer: Medicaid Other | Admitting: Family Medicine

## 2020-06-03 NOTE — Progress Notes (Deleted)
    SUBJECTIVE:   CHIEF COMPLAINT / HPI: "asthma and needing medication"   ***  PERTINENT  PMH / PSH: ***  OBJECTIVE:   There were no vitals taken for this visit.  ***  ASSESSMENT/PLAN:   No problem-specific Assessment & Plan notes found for this encounter.     Patriciaann Clan, [place]

## 2020-06-04 ENCOUNTER — Ambulatory Visit: Payer: Medicaid Other

## 2020-06-09 ENCOUNTER — Other Ambulatory Visit: Payer: Self-pay

## 2020-06-09 ENCOUNTER — Ambulatory Visit (INDEPENDENT_AMBULATORY_CARE_PROVIDER_SITE_OTHER): Payer: Medicaid Other | Admitting: Family Medicine

## 2020-06-09 VITALS — BP 126/90 | HR 111 | Ht 65.0 in | Wt 148.6 lb

## 2020-06-09 DIAGNOSIS — J4541 Moderate persistent asthma with (acute) exacerbation: Secondary | ICD-10-CM

## 2020-06-09 DIAGNOSIS — R0602 Shortness of breath: Secondary | ICD-10-CM | POA: Diagnosis not present

## 2020-06-09 DIAGNOSIS — J4521 Mild intermittent asthma with (acute) exacerbation: Secondary | ICD-10-CM | POA: Diagnosis present

## 2020-06-09 MED ORDER — PREDNISONE 20 MG PO TABS: 40.0000 mg | ORAL_TABLET | Freq: Every day | ORAL | 0 refills | Status: AC

## 2020-06-09 MED ORDER — PROAIR HFA 108 (90 BASE) MCG/ACT IN AERS: INHALATION_SPRAY | RESPIRATORY_TRACT | 2 refills | Status: DC

## 2020-06-09 MED ORDER — BUDESONIDE-FORMOTEROL FUMARATE 80-4.5 MCG/ACT IN AERO: 2.0000 | INHALATION_SPRAY | Freq: Every day | RESPIRATORY_TRACT | 3 refills | Status: DC

## 2020-06-09 NOTE — Progress Notes (Signed)
    SUBJECTIVE:   CHIEF COMPLAINT / HPI: " Trouble breathing"   [name] is a 29 year old gentleman presenting with his mother to discuss the following:  Asthma flare: Mom and patient report he has had trouble with his breathing for the past several months, however feels like the last week he has been coughing more.  Nonproductive.  He gets frequent (biweekly) Covid testing through work and just recently had a negative Covid test this past Friday, 10/1.  They report he has had several nighttime awakenings due to frequent coughing, coughing day/night.  He has been using his albuterol inhaler almost daily and has also been using mom's Breo inhaler to help.  He is a current smoker and not interested in cutting back.  Denies any associated fever, sore throat, nasal congestion, or change in appetite.  No known sick contacts.  Reports his chest does feel congested for the past few months.  Does feel more short of breath with frequent coughing, however breathing comfortably when he is not.   Just smoked a cigarette prior to coming in and used albuterol inhaler.  PERTINENT  PMH / PSH: Hypertension, mild intermittent asthma, GERD, tobacco use, alcohol use  OBJECTIVE:   BP 126/90   Pulse (!) 111   Ht 5\' 5"  (1.651 m)   Wt 148 lb 9.6 oz (67.4 kg)   SpO2 100%   BMI 24.73 kg/m   General: Alert, NAD HEENT: NCAT, MMM Cardiac: RRR   Lungs: Prolonged expiratory phase throughout all lung fields with occasional expiratory wheeze, otherwise clear with inhalation.  No increased work of breathing at rest or with walking, on room air satting appropriately. Abdomen: soft  Msk: Moves all extremities spontaneously  Ext: Warm, dry, 2+ distal pulses  ASSESSMENT/PLAN:   Asthma in adult, moderate persistent, with acute exacerbation Poorly controlled with likely mild exacerbation currently.  Will start on controller therapy, Rx Symbicort inhaler daily.  Additionally Rx'd prednisone course to improve inflammation  in the interim while Symbicort takes effect.  Refilled albuterol inhaler as needed, encourage scheduled use for the next 24 hours.  Adamantly discussed tobacco cessation, he is not interested in quitting at this time.     Follow-up in 1 month to check in on above or sooner if needed, including if no improvement in respiratory status/cough or especially if any difficulty breathing, development of fever, chest pain, or change in mentation.  Patriciaann Clan, [place]

## 2020-06-09 NOTE — Patient Instructions (Signed)
It was wonderful to see you today.  I have sent in a oral steroid course to help with inflammation in your lungs.  Additionally I sent in an inhaler that has a steroid and the albuterol type medication within it.  You will start taking this on a daily basis.  You can use the albuterol inhaler as needed.  I encourage you to use the albuterol scheduled every 6 hours for the next day to improve your breathing.  The most important thing to get your breathing under better control with to be quit smoking.  Please let us know if you are interested in starting any medications or nicotine replacement therapy.   Follow-up with your primary care provider in 1 month to follow-up on your asthma or sooner if not improving with the regimen prescribed.

## 2020-06-11 ENCOUNTER — Encounter: Payer: Self-pay | Admitting: Family Medicine

## 2020-06-11 NOTE — Assessment & Plan Note (Signed)
Poorly controlled with likely mild exacerbation currently.  Will start on controller therapy, Rx Symbicort inhaler daily.  Additionally Rx'd prednisone course to improve inflammation in the interim while Symbicort takes effect.  Refilled albuterol inhaler as needed, encourage scheduled use for the next 24 hours.  Timothy Hubbard discussed tobacco cessation, he is not interested in quitting at this time.

## 2020-06-18 ENCOUNTER — Telehealth: Payer: Self-pay

## 2020-06-18 NOTE — Telephone Encounter (Signed)
Patient's mother calls nurse line requesting rx for sleep aide for patient. Mother reports that patient has been having increasing difficulty with sleep that is causing him to increase alcohol consumption.   Mother states that she spoke with Dr. Higinio Plan about initiating sleep medication at [place] on 10/5. I am unable to find this in OV note.   Will forward to PCP and Dr. Higinio Plan  Please advise  Talbot Grumbling, RN

## 2020-06-18 NOTE — Telephone Encounter (Signed)
He can try using melatonin 2 hours prior to bedtime.  Additionally encouraged avoiding use of TV/phones 2 hours prior to bed and keeping his room dark.  We can discuss this further on his follow-up visit as well.  Patriciaann Clan, DO

## 2020-07-21 ENCOUNTER — Other Ambulatory Visit: Payer: Self-pay | Admitting: *Deleted

## 2020-07-21 DIAGNOSIS — K219 Gastro-esophageal reflux disease without esophagitis: Secondary | ICD-10-CM

## 2020-07-21 MED ORDER — OMEPRAZOLE 20 MG PO CPDR: DELAYED_RELEASE_CAPSULE | ORAL | 1 refills | Status: DC

## 2020-09-08 ENCOUNTER — Other Ambulatory Visit: Payer: Self-pay

## 2020-09-08 DIAGNOSIS — K219 Gastro-esophageal reflux disease without esophagitis: Secondary | ICD-10-CM

## 2020-09-08 MED ORDER — OMEPRAZOLE 20 MG PO CPDR: DELAYED_RELEASE_CAPSULE | ORAL | 0 refills | Status: DC

## 2020-09-08 MED ORDER — AMLODIPINE BESYLATE 10 MG PO TABS: 10.0000 mg | ORAL_TABLET | Freq: Every day | ORAL | 0 refills | Status: DC

## 2020-09-08 MED ORDER — FAMOTIDINE 20 MG PO TABS: 20.0000 mg | ORAL_TABLET | Freq: Two times a day (BID) | ORAL | 0 refills | Status: DC

## 2020-09-08 NOTE — Telephone Encounter (Signed)
Mother calls nurse line requesting refills for acid reflux and blood pressure. Mother reports the medications called in previously they were not able to pick up. Mother reports they were sent to the wrong pharmacy and now with the new year they are not transferable. Mother advised he needs to make an apt soon with his PCP. I have updated his pharmacy. Please advise on refills.

## 2020-11-10 ENCOUNTER — Other Ambulatory Visit: Payer: Self-pay | Admitting: Family Medicine

## 2020-12-25 ENCOUNTER — Other Ambulatory Visit: Payer: Self-pay | Admitting: Family Medicine

## 2020-12-25 DIAGNOSIS — K219 Gastro-esophageal reflux disease without esophagitis: Secondary | ICD-10-CM

## 2020-12-30 NOTE — Telephone Encounter (Signed)
Patients mother is calling to check on the status of having his Amlodipine and Omeprazole refilled. Patient is out of medication.

## 2021-01-01 ENCOUNTER — Other Ambulatory Visit: Payer: Self-pay

## 2021-01-01 ENCOUNTER — Encounter (HOSPITAL_COMMUNITY): Payer: Self-pay | Admitting: Medical Oncology

## 2021-01-01 ENCOUNTER — Ambulatory Visit (HOSPITAL_COMMUNITY)
Admission: EM | Admit: 2021-01-01 | Discharge: 2021-01-01 | Disposition: A | Discharge status: Home / Self Care | Payer: Medicaid Other | Attending: Medical Oncology | Admitting: Medical Oncology

## 2021-01-01 DIAGNOSIS — M545 Low back pain, unspecified: Secondary | ICD-10-CM | POA: Diagnosis not present

## 2021-01-01 DIAGNOSIS — M542 Cervicalgia: Secondary | ICD-10-CM | POA: Diagnosis not present

## 2021-01-01 MED ORDER — METHOCARBAMOL 500 MG PO TABS: 500.0000 mg | ORAL_TABLET | Freq: Two times a day (BID) | ORAL | 0 refills | Status: DC
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
## 2021-03-15 DIAGNOSIS — Z20822 Contact with and (suspected) exposure to covid-19: Secondary | ICD-10-CM | POA: Diagnosis not present

## 2021-03-22 ENCOUNTER — Ambulatory Visit: Payer: Medicaid Other

## 2021-04-05 ENCOUNTER — Other Ambulatory Visit: Payer: Self-pay | Admitting: Family Medicine

## 2021-04-05 DIAGNOSIS — K219 Gastro-esophageal reflux disease without esophagitis: Secondary | ICD-10-CM

## 2021-06-17 ENCOUNTER — Other Ambulatory Visit: Payer: Self-pay | Admitting: Family Medicine

## 2021-06-17 DIAGNOSIS — J4521 Mild intermittent asthma with (acute) exacerbation: Secondary | ICD-10-CM

## 2021-06-17 DIAGNOSIS — R0602 Shortness of breath: Secondary | ICD-10-CM

## 2021-06-21 ENCOUNTER — Telehealth: Payer: Self-pay | Admitting: *Deleted

## 2021-06-21 NOTE — Telephone Encounter (Signed)
Received fax from pharmacy stating that Abe People has been discontinued by the manufacturer. Please send in and equivalent if appropriate.Davidson Palmieri Zimmerman Rumple, CMA

## 2021-06-27 MED ORDER — ALBUTEROL SULFATE HFA 108 (90 BASE) MCG/ACT IN AERS: 2.0000 | INHALATION_SPRAY | Freq: Four times a day (QID) | RESPIRATORY_TRACT | 1 refills | Status: DC | PRN

## 2021-06-27 NOTE — Telephone Encounter (Signed)
Rx sent for Albuterol inhaler instead of Proair.

## 2021-07-21 ENCOUNTER — Other Ambulatory Visit: Payer: Self-pay | Admitting: Family Medicine

## 2021-07-21 DIAGNOSIS — K219 Gastro-esophageal reflux disease without esophagitis: Secondary | ICD-10-CM

## 2021-09-16 ENCOUNTER — Other Ambulatory Visit: Payer: Self-pay | Admitting: Family Medicine

## 2021-09-16 DIAGNOSIS — K219 Gastro-esophageal reflux disease without esophagitis: Secondary | ICD-10-CM

## 2021-09-19 ENCOUNTER — Other Ambulatory Visit: Payer: Self-pay | Admitting: Family Medicine

## 2021-09-20 NOTE — Telephone Encounter (Signed)
Patient needs to be seen in the office prior to any additional refills. Please advise him to schedule appointment.

## 2021-09-21 NOTE — Telephone Encounter (Signed)
Contacted pt and appt made for tomorrow with Dr. Andria Frames. Pt wanted to come tomorrow due to others having appts on this day.Taheera Thomann Zimmerman Rumple, CMA

## 2021-09-22 ENCOUNTER — Ambulatory Visit: Payer: Medicaid Other | Admitting: Family Medicine

## 2022-01-06 ENCOUNTER — Other Ambulatory Visit: Payer: Self-pay | Admitting: Family Medicine

## 2022-01-06 NOTE — Telephone Encounter (Signed)
90 day supply not appropriate. Needs to be seen in the office. ?

## 2022-02-08 ENCOUNTER — Other Ambulatory Visit: Payer: Self-pay | Admitting: Family Medicine

## 2022-02-08 DIAGNOSIS — K219 Gastro-esophageal reflux disease without esophagitis: Secondary | ICD-10-CM

## 2022-03-01 ENCOUNTER — Other Ambulatory Visit: Payer: Self-pay

## 2022-03-01 ENCOUNTER — Inpatient Hospital Stay (HOSPITAL_COMMUNITY)
Admission: EM | Admit: 2022-03-01 | Discharge: 2022-03-10 | DRG: 917 | Disposition: A | Discharge status: Rehabilitation Facility | Payer: Medicaid Other | Attending: Internal Medicine | Admitting: Internal Medicine

## 2022-03-01 ENCOUNTER — Encounter (HOSPITAL_COMMUNITY): Payer: Self-pay

## 2022-03-01 ENCOUNTER — Emergency Department (HOSPITAL_COMMUNITY): Payer: Medicaid Other

## 2022-03-01 DIAGNOSIS — E46 Unspecified protein-calorie malnutrition: Secondary | ICD-10-CM | POA: Diagnosis present

## 2022-03-01 DIAGNOSIS — G928 Other toxic encephalopathy: Secondary | ICD-10-CM | POA: Diagnosis present

## 2022-03-01 DIAGNOSIS — R471 Dysarthria and anarthria: Secondary | ICD-10-CM | POA: Diagnosis not present

## 2022-03-01 DIAGNOSIS — F1210 Cannabis abuse, uncomplicated: Secondary | ICD-10-CM | POA: Diagnosis present

## 2022-03-01 DIAGNOSIS — F17200 Nicotine dependence, unspecified, uncomplicated: Secondary | ICD-10-CM | POA: Diagnosis present

## 2022-03-01 DIAGNOSIS — S0081XA Abrasion of other part of head, initial encounter: Secondary | ICD-10-CM | POA: Diagnosis present

## 2022-03-01 DIAGNOSIS — R4182 Altered mental status, unspecified: Secondary | ICD-10-CM | POA: Diagnosis not present

## 2022-03-01 DIAGNOSIS — R000 Tachycardia, unspecified: Secondary | ICD-10-CM | POA: Diagnosis not present

## 2022-03-01 DIAGNOSIS — G9340 Encephalopathy, unspecified: Secondary | ICD-10-CM | POA: Diagnosis present

## 2022-03-01 DIAGNOSIS — K219 Gastro-esophageal reflux disease without esophagitis: Secondary | ICD-10-CM | POA: Diagnosis present

## 2022-03-01 DIAGNOSIS — Z781 Physical restraint status: Secondary | ICD-10-CM

## 2022-03-01 DIAGNOSIS — Z91148 Patient's other noncompliance with medication regimen for other reason: Secondary | ICD-10-CM | POA: Diagnosis not present

## 2022-03-01 DIAGNOSIS — E872 Acidosis, unspecified: Secondary | ICD-10-CM | POA: Diagnosis present

## 2022-03-01 DIAGNOSIS — Z20822 Contact with and (suspected) exposure to covid-19: Secondary | ICD-10-CM | POA: Diagnosis present

## 2022-03-01 DIAGNOSIS — R770 Abnormality of albumin: Secondary | ICD-10-CM | POA: Diagnosis not present

## 2022-03-01 DIAGNOSIS — Z7951 Long term (current) use of inhaled steroids: Secondary | ICD-10-CM | POA: Diagnosis not present

## 2022-03-01 DIAGNOSIS — Y92009 Unspecified place in unspecified non-institutional (private) residence as the place of occurrence of the external cause: Secondary | ICD-10-CM

## 2022-03-01 DIAGNOSIS — R319 Hematuria, unspecified: Secondary | ICD-10-CM | POA: Diagnosis not present

## 2022-03-01 DIAGNOSIS — R402422 Glasgow coma scale score 9-12, at arrival to emergency department: Secondary | ICD-10-CM

## 2022-03-01 DIAGNOSIS — Z79899 Other long term (current) drug therapy: Secondary | ICD-10-CM

## 2022-03-01 DIAGNOSIS — R569 Unspecified convulsions: Secondary | ICD-10-CM | POA: Diagnosis not present

## 2022-03-01 DIAGNOSIS — K625 Hemorrhage of anus and rectum: Secondary | ICD-10-CM | POA: Diagnosis not present

## 2022-03-01 DIAGNOSIS — G40909 Epilepsy, unspecified, not intractable, without status epilepticus: Secondary | ICD-10-CM | POA: Diagnosis present

## 2022-03-01 DIAGNOSIS — J4541 Moderate persistent asthma with (acute) exacerbation: Secondary | ICD-10-CM | POA: Diagnosis present

## 2022-03-01 DIAGNOSIS — R710 Precipitous drop in hematocrit: Secondary | ICD-10-CM | POA: Diagnosis not present

## 2022-03-01 DIAGNOSIS — F1410 Cocaine abuse, uncomplicated: Secondary | ICD-10-CM | POA: Diagnosis present

## 2022-03-01 DIAGNOSIS — I10 Essential (primary) hypertension: Secondary | ICD-10-CM | POA: Diagnosis not present

## 2022-03-01 DIAGNOSIS — D124 Benign neoplasm of descending colon: Secondary | ICD-10-CM | POA: Diagnosis not present

## 2022-03-01 DIAGNOSIS — I6522 Occlusion and stenosis of left carotid artery: Secondary | ICD-10-CM | POA: Diagnosis not present

## 2022-03-01 DIAGNOSIS — F1010 Alcohol abuse, uncomplicated: Secondary | ICD-10-CM | POA: Diagnosis present

## 2022-03-01 DIAGNOSIS — R1310 Dysphagia, unspecified: Secondary | ICD-10-CM | POA: Diagnosis present

## 2022-03-01 DIAGNOSIS — T405X1A Poisoning by cocaine, accidental (unintentional), initial encounter: Secondary | ICD-10-CM | POA: Diagnosis present

## 2022-03-01 DIAGNOSIS — D509 Iron deficiency anemia, unspecified: Secondary | ICD-10-CM | POA: Diagnosis present

## 2022-03-01 DIAGNOSIS — F1090 Alcohol use, unspecified, uncomplicated: Secondary | ICD-10-CM | POA: Diagnosis present

## 2022-03-01 DIAGNOSIS — W010XXA Fall on same level from slipping, tripping and stumbling without subsequent striking against object, initial encounter: Secondary | ICD-10-CM | POA: Diagnosis present

## 2022-03-01 DIAGNOSIS — F17210 Nicotine dependence, cigarettes, uncomplicated: Secondary | ICD-10-CM | POA: Diagnosis present

## 2022-03-01 DIAGNOSIS — J45909 Unspecified asthma, uncomplicated: Secondary | ICD-10-CM | POA: Diagnosis present

## 2022-03-01 DIAGNOSIS — E876 Hypokalemia: Secondary | ICD-10-CM | POA: Diagnosis not present

## 2022-03-01 DIAGNOSIS — Z886 Allergy status to analgesic agent status: Secondary | ICD-10-CM

## 2022-03-01 DIAGNOSIS — Z6820 Body mass index (BMI) 20.0-20.9, adult: Secondary | ICD-10-CM

## 2022-03-01 DIAGNOSIS — Z8249 Family history of ischemic heart disease and other diseases of the circulatory system: Secondary | ICD-10-CM

## 2022-03-01 DIAGNOSIS — E162 Hypoglycemia, unspecified: Secondary | ICD-10-CM | POA: Diagnosis not present

## 2022-03-01 DIAGNOSIS — K649 Unspecified hemorrhoids: Secondary | ICD-10-CM | POA: Diagnosis not present

## 2022-03-01 DIAGNOSIS — K921 Melena: Secondary | ICD-10-CM | POA: Diagnosis not present

## 2022-03-01 DIAGNOSIS — R4701 Aphasia: Secondary | ICD-10-CM | POA: Diagnosis not present

## 2022-03-01 DIAGNOSIS — M62838 Other muscle spasm: Secondary | ICD-10-CM | POA: Diagnosis not present

## 2022-03-01 DIAGNOSIS — R509 Fever, unspecified: Secondary | ICD-10-CM

## 2022-03-01 DIAGNOSIS — Z818 Family history of other mental and behavioral disorders: Secondary | ICD-10-CM

## 2022-03-01 DIAGNOSIS — R55 Syncope and collapse: Secondary | ICD-10-CM | POA: Diagnosis not present

## 2022-03-01 HISTORY — DX: Essential (primary) hypertension: I10

## 2022-03-01 HISTORY — DX: Encephalopathy, unspecified: G93.40

## 2022-03-01 LAB — CBC WITH DIFFERENTIAL/PLATELET
Abs Immature Granulocytes: 0.11 10*3/uL — ABNORMAL HIGH (ref 0.00–0.07)
Basophils Absolute: 0 10*3/uL (ref 0.0–0.1)
Basophils Relative: 0 %
Eosinophils Absolute: 0 10*3/uL (ref 0.0–0.5)
Eosinophils Relative: 0 %
HCT: 30.9 % — ABNORMAL LOW (ref 39.0–52.0)
Hemoglobin: 8.7 g/dL — ABNORMAL LOW (ref 13.0–17.0)
Immature Granulocytes: 1 %
Lymphocytes Relative: 12 %
Lymphs Abs: 1.6 10*3/uL (ref 0.7–4.0)
MCH: 22.6 pg — ABNORMAL LOW (ref 26.0–34.0)
MCHC: 28.2 g/dL — ABNORMAL LOW (ref 30.0–36.0)
MCV: 80.3 fL (ref 80.0–100.0)
Monocytes Absolute: 1.6 10*3/uL — ABNORMAL HIGH (ref 0.1–1.0)
Monocytes Relative: 12 %
Neutro Abs: 9.5 10*3/uL — ABNORMAL HIGH (ref 1.7–7.7)
Neutrophils Relative %: 75 %
Platelets: 305 10*3/uL (ref 150–400)
RBC: 3.85 MIL/uL — ABNORMAL LOW (ref 4.22–5.81)
RDW: 23.1 % — ABNORMAL HIGH (ref 11.5–15.5)
WBC: 12.8 10*3/uL — ABNORMAL HIGH (ref 4.0–10.5)
nRBC: 0 % (ref 0.0–0.2)

## 2022-03-01 LAB — HEPATIC FUNCTION PANEL
ALT: <5 U/L (ref 0–44)
AST: 57 U/L — ABNORMAL HIGH (ref 15–41)
Albumin: 4.1 g/dL (ref 3.5–5.0)
Alkaline Phosphatase: 64 U/L (ref 38–126)
Bilirubin, Direct: 0.3 mg/dL — ABNORMAL HIGH (ref 0.0–0.2)
Indirect Bilirubin: 1 mg/dL — ABNORMAL HIGH (ref 0.3–0.9)
Total Bilirubin: 1.3 mg/dL — ABNORMAL HIGH (ref 0.3–1.2)
Total Protein: 7.2 g/dL (ref 6.5–8.1)

## 2022-03-01 LAB — BASIC METABOLIC PANEL
Anion gap: 26 — ABNORMAL HIGH (ref 5–15)
BUN: 10 mg/dL (ref 6–20)
CO2: 12 mmol/L — ABNORMAL LOW (ref 22–32)
Calcium: 10 mg/dL (ref 8.9–10.3)
Chloride: 103 mmol/L (ref 98–111)
Creatinine, Ser: 1.29 mg/dL — ABNORMAL HIGH (ref 0.61–1.24)
GFR, Estimated: >60 mL/min (ref >60)
Glucose, Bld: 119 mg/dL — ABNORMAL HIGH (ref 70–99)
Potassium: 3.9 mmol/L (ref 3.5–5.1)
Sodium: 141 mmol/L (ref 135–145)

## 2022-03-01 LAB — TROPONIN I (HIGH SENSITIVITY): Troponin I (High Sensitivity): 8 ng/L (ref <18)

## 2022-03-01 LAB — APTT: aPTT: 24 seconds (ref 24–36)

## 2022-03-01 LAB — PROTIME-INR
INR: 0.9 (ref 0.8–1.2)
Prothrombin Time: 12.1 seconds (ref 11.4–15.2)

## 2022-03-01 LAB — LACTIC ACID, PLASMA: Lactic Acid, Venous: >9 mmol/L (ref 0.5–1.9)

## 2022-03-01 LAB — CBG MONITORING, ED: Glucose-Capillary: 126 mg/dL — ABNORMAL HIGH (ref 70–99)

## 2022-03-01 LAB — MAGNESIUM: Magnesium: 2 mg/dL (ref 1.7–2.4)

## 2022-03-01 MED ORDER — SODIUM CHLORIDE 0.9 % IV SOLN
2.0000 g | Freq: Once | INTRAVENOUS | Status: AC
  Administered 2022-03-01: 2 g via INTRAVENOUS
  Filled 2022-03-01: qty 20

## 2022-03-01 MED ORDER — LACTATED RINGERS IV BOLUS (SEPSIS): 250.0000 mL | Freq: Once | INTRAVENOUS | Status: DC

## 2022-03-01 MED ORDER — LORAZEPAM 2 MG/ML IJ SOLN
2.0000 mg | Freq: Once | INTRAMUSCULAR | Status: AC
  Administered 2022-03-01: 2 mg via INTRAVENOUS
  Filled 2022-03-01: qty 1

## 2022-03-01 MED ORDER — ACETAMINOPHEN 650 MG RE SUPP
650.0000 mg | Freq: Once | RECTAL | Status: AC
  Administered 2022-03-01: 650 mg via RECTAL
  Filled 2022-03-01: qty 1

## 2022-03-01 MED ORDER — LACTATED RINGERS IV SOLN: INTRAVENOUS | Status: DC

## 2022-03-01 MED ORDER — LACTATED RINGERS IV BOLUS
1000.0000 mL | Freq: Once | INTRAVENOUS | Status: AC
  Administered 2022-03-01: 1000 mL via INTRAVENOUS

## 2022-03-01 MED ORDER — POLYETHYLENE GLYCOL 3350 17 G PO PACK: 17.0000 g | PACK | Freq: Every day | ORAL | Status: DC | PRN

## 2022-03-01 MED ORDER — DOCUSATE SODIUM 100 MG PO CAPS
100.0000 mg | ORAL_CAPSULE | Freq: Two times a day (BID) | ORAL | Status: DC | PRN
  Administered 2022-03-04: 100 mg via ORAL
  Filled 2022-03-01: qty 1

## 2022-03-01 MED ORDER — LACTATED RINGERS IV BOLUS (SEPSIS)
1000.0000 mL | Freq: Once | INTRAVENOUS | Status: AC
  Administered 2022-03-01: 1000 mL via INTRAVENOUS

## 2022-03-01 MED ORDER — VANCOMYCIN HCL IN DEXTROSE 1-5 GM/200ML-% IV SOLN: 1000.0000 mg | Freq: Once | INTRAVENOUS | Status: DC

## 2022-03-01 MED ORDER — LACTATED RINGERS IV BOLUS
1000.0000 mL | Freq: Once | INTRAVENOUS | Status: DC
Start: 2022-03-02 — End: 2022-03-04

## 2022-03-01 MED ORDER — ENOXAPARIN SODIUM 40 MG/0.4ML IJ SOSY
40.0000 mg | PREFILLED_SYRINGE | Freq: Every day | INTRAMUSCULAR | Status: DC
  Administered 2022-03-02 – 2022-03-04 (×4): 40 mg via SUBCUTANEOUS
  Filled 2022-03-01 (×4): qty 0.4

## 2022-03-01 MED ORDER — LIDOCAINE-EPINEPHRINE (PF) 2 %-1:200000 IJ SOLN
10.0000 mL | Freq: Once | INTRAMUSCULAR | Status: AC
  Administered 2022-03-01: 10 mL via INTRADERMAL
  Filled 2022-03-01: qty 20

## 2022-03-01 MED ORDER — VANCOMYCIN HCL 1500 MG/300ML IV SOLN
1500.0000 mg | INTRAVENOUS | Status: AC
  Administered 2022-03-01: 1500 mg via INTRAVENOUS
  Filled 2022-03-01: qty 300

## 2022-03-01 MED ORDER — DEXAMETHASONE SODIUM PHOSPHATE 10 MG/ML IJ SOLN
10.0000 mg | Freq: Once | INTRAMUSCULAR | Status: AC
  Administered 2022-03-01: 10 mg via INTRAVENOUS
  Filled 2022-03-01: qty 1

## 2022-03-01 NOTE — ED Provider Notes (Signed)
[facility] DEPT Provider Note   CSN: [id number] Arrival date & time: 03/01/22  2117     History  Chief Complaint  Patient presents with   Seizures    Timothy Hubbard is a 31 y.o. male.  HPI Patient presents for altered mental status.  Medical history includes HTN, asthma, alcohol use disorder, GERD.  Patient arrives via POV.  History is provided by his mother.  Patient's mother states that his current alcohol intake is approximately 3 beers per day.  His last drink that she was aware of was this morning.  He was recently complaining about some side pain but other than that seem to be in his normal state of health up until this evening.  Patient's mother saw him coming out of the bathroom and he appeared to be off balance.  He laid down in the bed.  Patient subsequently had what is described as a tonic-clonic seizure that lasted approximately 7 minutes.  Afterwards, he was confused.  After approximately 1 hour, patient had a second seizure, that was similar in appearance to the first.  He again had a postictal phase of approximately 1 hour before a third seizure.  At that point, patient's family brought him to the ED.  Patient's mother states that he has no history of seizure.  He takes medicine for blood pressure but typically misses his doses.  He does smoke marijuana but she is not aware of any other illicit drug use.    Home Medications Prior to Admission medications   Medication Sig Start Date End Date Taking? Authorizing Provider  albuterol (VENTOLIN HFA) 108 (90 Base) MCG/ACT inhaler Inhale 2 puffs into the lungs every 6 (six) hours as needed for wheezing or shortness of breath. 06/27/21   Alcus Dad, MD  amLODipine (NORVASC) 10 MG tablet TAKE 1 TABLET BY MOUTH DAILY AT BEDTIME. You must be seen in the office prior to any additional refills. Please call for an appointment 01/06/22   Alcus Dad, MD  budesonide-formoterol [facility]) 80-4.5 MCG/ACT  inhaler INHALE 2 PUFFS INTO THE LUNGS DAILY 06/19/21   Alcus Dad, MD  famotidine (PEPCID) 20 MG tablet TAKE 1 TABLET(20 MG) BY MOUTH TWICE DAILY 11/10/20   Alcus Dad, MD  methocarbamol (ROBAXIN) 500 MG tablet Take 1 tablet (500 mg total) by mouth 2 (two) times daily. 01/01/21   Hughie Closs, PA-C  nicotine (EQ NICOTINE) 14 mg/24hr patch Place 1 patch (14 mg total) onto the skin daily. 04/01/16   Mercy Riding, MD  omeprazole (PRILOSEC) 20 MG capsule TAKE 1 CAPSULE(20 MG) BY MOUTH DAILY AS NEEDED FOR ACID REFLUX. Patient needs to be seen in the office prior to receiving any additional refills. 07/23/21   Alcus Dad, MD      Allergies    Ibuprofen    Review of Systems   Review of Systems  Unable to perform ROS: Mental status change    Physical Exam Updated Vital Signs BP (!) 137/97   Pulse 95   Temp (!) 101.4 F (38.6 C) (Rectal)   Resp (!) 27   Ht '5\' 4"'$  (1.626 m)   Wt 54.4 kg   SpO2 100%   BMI 20.60 kg/m  Physical Exam Vitals and nursing note reviewed.  Constitutional:      Appearance: He is well-developed. He is ill-appearing.  HENT:     Head: Normocephalic.     Comments: Subacute abrasions around lateral left periorbital area    Right Ear: External ear  normal.     Nose: Nose normal.     Mouth/Throat:     Mouth: Mucous membranes are moist.  Eyes:     Extraocular Movements: Extraocular movements intact.     Conjunctiva/sclera: Conjunctivae normal.  Cardiovascular:     Rate and Rhythm: Regular rhythm. Tachycardia present.     Heart sounds: No murmur heard. Pulmonary:     Effort: Pulmonary effort is normal. Tachypnea present. No respiratory distress.     Breath sounds: Normal breath sounds. No wheezing or rales.  Chest:     Chest wall: No tenderness.  Abdominal:     General: There is no distension.     Palpations: Abdomen is soft.     Tenderness: There is no abdominal tenderness.  Musculoskeletal:        General: No swelling, tenderness or  deformity.     Cervical back: Neck supple.     Right lower leg: No edema.     Left lower leg: No edema.  Skin:    General: Skin is warm and dry.     Coloration: Skin is not jaundiced or pale.  Neurological:     General: No focal deficit present.     Mental Status: He is disoriented.     GCS: GCS eye subscore is 4. GCS verbal subscore is 2. GCS motor subscore is 5.     ED Results / Procedures / Treatments   Labs (all labs ordered are listed, but only abnormal results are displayed) Labs Reviewed  BASIC METABOLIC PANEL - Abnormal; Notable for the following components:      Result Value   CO2 12 (*)    Glucose, Bld 119 (*)    Creatinine, Ser 1.29 (*)    Anion gap 26 (*)    All other components within normal limits  HEPATIC FUNCTION PANEL - Abnormal; Notable for the following components:   AST 57 (*)    Total Bilirubin 1.3 (*)    Bilirubin, Direct 0.3 (*)    Indirect Bilirubin 1.0 (*)    All other components within normal limits  LACTIC ACID, PLASMA - Abnormal; Notable for the following components:   Lactic Acid, Venous >9.0 (*)    All other components within normal limits  CBC WITH DIFFERENTIAL/PLATELET - Abnormal; Notable for the following components:   WBC 12.8 (*)    RBC 3.85 (*)    Hemoglobin 8.7 (*)    HCT 30.9 (*)    MCH 22.6 (*)    MCHC 28.2 (*)    RDW 23.1 (*)    Neutro Abs 9.5 (*)    Monocytes Absolute 1.6 (*)    Abs Immature Granulocytes 0.11 (*)    All other components within normal limits  GLUCOSE, CSF - Abnormal; Notable for the following components:   Glucose, CSF 76 (*)    All other components within normal limits  PROTEIN, CSF - Abnormal; Notable for the following components:   Total  Protein, CSF <6 (*)    All other components within normal limits  CBG MONITORING, ED - Abnormal; Notable for the following components:   Glucose-Capillary 126 (*)    All other components within normal limits  RESP PANEL BY RT-PCR (FLU A&B, COVID) ARPGX2  CULTURE,  BLOOD (ROUTINE X 2)  CULTURE, BLOOD (ROUTINE X 2)  URINE CULTURE  CSF CULTURE W GRAM STAIN  HSV CULTURE AND TYPING  MAGNESIUM  PROTIME-INR  APTT  RAPID URINE DRUG SCREEN, HOSP PERFORMED  ETHANOL  URINALYSIS, ROUTINE W  REFLEX MICROSCOPIC  LACTIC ACID, PLASMA  CSF CELL COUNT WITH DIFFERENTIAL  CSF CELL COUNT WITH DIFFERENTIAL  HSV 1/2 PCR, CSF  VDRL, CSF  HIV ANTIBODY (ROUTINE TESTING W REFLEX)  PROCALCITONIN  URINALYSIS, ROUTINE W REFLEX MICROSCOPIC  BASIC METABOLIC PANEL  CBC  MAGNESIUM  BLOOD GAS, VENOUS  TROPONIN I (HIGH SENSITIVITY)  TROPONIN I (HIGH SENSITIVITY)    EKG EKG Interpretation  Date/Time:  Tuesday March 01 2022 21:22:23 EDT Ventricular Rate:  107 PR Interval:    QRS Duration: 169 QT Interval:  310 QTC Calculation: 414 R Axis:   71 Text Interpretation: Sinus tachycardia Nonspecific intraventricular conduction delay Repol abnrm suggests ischemia, lateral leads Artifact in lead(s) I III aVR aVL V1 V2 V3 V4 V5 Confirmed by Godfrey Pick ([id number]) on 03/01/2022 10:04:47 PM  Radiology CT Head Wo Contrast  Result Date: 03/01/2022 CLINICAL DATA:  Mental status change, unknown EXAM: CT HEAD WITHOUT CONTRAST TECHNIQUE: Contiguous axial images were obtained from the base of the skull through the vertex without intravenous contrast. RADIATION DOSE REDUCTION: This exam was performed according to the departmental dose-optimization program which includes automated exposure control, adjustment of the mA and/or kV according to patient size and/or use of iterative reconstruction technique. COMPARISON:  CT head dated Feb 01, 2013 FINDINGS: Brain: No evidence of acute infarction, hemorrhage, hydrocephalus, extra-axial collection or mass lesion/mass effect. Vascular: No hyperdense vessel or unexpected calcification. Skull: Normal. Negative for fracture or focal lesion. Sinuses/Orbits: No acute finding. Other: None. IMPRESSION: No acute intracranial abnormality. Electronically Signed   By:  Keane Police D.O.   On: 03/01/2022 22:31   DG Chest Port 1 View  Result Date: 03/01/2022 CLINICAL DATA:  Questionable sepsis - evaluate for abnormality. Unresponsive EXAM: PORTABLE CHEST 1 VIEW COMPARISON:  None Available. FINDINGS: The heart size and mediastinal contours are within normal limits. Both lungs are clear. The visualized skeletal structures are unremarkable. IMPRESSION: No active disease. Electronically Signed   By: Rolm Baptise M.D.   On: 03/01/2022 22:10    Procedures .Lumbar Puncture  Date/Time: 03/01/2022 11:07 PM  Performed by: Godfrey Pick, MD Authorized by: Godfrey Pick, MD   Consent:    Consent obtained:  Verbal   Consent given by:  Parent   Risks, benefits, and alternatives were discussed: yes     Risks discussed:  Bleeding, infection and pain   Alternatives discussed:  No treatment and delayed treatment Universal protocol:    Procedure explained and questions answered to patient or proxy's satisfaction: yes     Imaging studies available: yes     Patient identity confirmed:  Arm band Pre-procedure details:    Procedure purpose:  Diagnostic   Preparation: Patient was prepped and draped in usual sterile fashion   Sedation:    Sedation type:  Anxiolysis Anesthesia:    Anesthesia method:  Local infiltration   Local anesthetic:  Lidocaine 1% w/o epi Procedure details:    Lumbar space:  L4-L5 interspace   Patient position:  R lateral decubitus   Needle gauge:  20   Needle type:  Spinal needle - Quincke tip   Needle length (in):  3.5   Ultrasound guidance: no     Number of attempts:  1   Fluid appearance:  Clear   Tubes of fluid:  4   Total volume (ml):  4 Post-procedure details:    Puncture site:  Adhesive bandage applied   Procedure completion:  Tolerated well, no immediate complications     Medications Ordered in ED  Medications  lactated ringers infusion ( Intravenous New Bag/Given 03/01/22 2331)  lactated ringers bolus 1,000 mL (1,000 mLs Intravenous  New Bag/Given 03/01/22 2148)    And  lactated ringers bolus 250 mL (has no administration in time range)  vancomycin (VANCOREADY) IVPB 1500 mg/300 mL (1,500 mg Intravenous New Bag/Given 03/01/22 2333)  docusate sodium (COLACE) capsule 100 mg (has no administration in time range)  polyethylene glycol (MIRALAX / GLYCOLAX) packet 17 g (has no administration in time range)  enoxaparin (LOVENOX) injection 40 mg (has no administration in time range)  lactated ringers bolus 1,000 mL (has no administration in time range)  LORazepam (ATIVAN) injection 2 mg (2 mg Intravenous Given 03/01/22 2125)  lactated ringers bolus 1,000 mL (1,000 mLs Intravenous New Bag/Given 03/01/22 2131)  dexamethasone (DECADRON) injection 10 mg (10 mg Intravenous Given 03/01/22 2138)  cefTRIAXone (ROCEPHIN) 2 g in sodium chloride 0.9 % 100 mL IVPB (2 g Intravenous New Bag/Given 03/01/22 2142)  lidocaine-EPINEPHrine (XYLOCAINE W/EPI) 2 %-1:200000 (PF) injection 10 mL (10 mLs Intradermal Given by Other 03/01/22 2310)  acetaminophen (TYLENOL) suppository 650 mg (650 mg Rectal Given 03/01/22 2320)  LORazepam (ATIVAN) injection 2 mg (2 mg Intravenous Given 03/01/22 2304)    ED Course/ Medical Decision Making/ A&P                           Medical Decision Making Amount and/or Complexity of Data Reviewed Labs: ordered. Radiology: ordered. ECG/medicine tests: ordered.  Risk OTC drugs. Prescription drug management. Decision regarding hospitalization.   This patient presents to the ED for concern of seizure, this involves an extensive number of treatment options, and is a complaint that carries with it a high risk of complications and morbidity.  The differential diagnosis includes new onset epilepsy, drug intoxication, alcohol withdrawal, head trauma, meningitis   Co morbidities that complicate the patient evaluation  Hypertension, alcohol use, GERD, asthma   Additional history obtained:  Additional history obtained from  patient's mother External records from outside source obtained and reviewed including EMR   Lab Tests:  I Ordered, and personally interpreted labs.  The pertinent results include: Lab work is notable for a leukocytosis and a severely elevated lactic acid, consistent with recent seizures +/- severe infection.  Normocytic anemia of unknown chronicity is present.  Creatinine is elevated when compared to lab work from 2 years ago.   Imaging Studies ordered:  I ordered imaging studies including CT head, chest x-ray I independently visualized and interpreted imaging which showed no acute findings I agree with the radiologist interpretation   Cardiac Monitoring: / EKG:  The patient was maintained on a cardiac monitor.  I personally viewed and interpreted the cardiac monitored which showed an underlying rhythm of: Sinus rhythm   Consultations Obtained:  I requested consultation with the neurologist, Dr. Malen Gauze,  and discussed lab and imaging findings as well as pertinent plan - they recommend: Admission to [facility] I requested consultation with the intensivist, Dr. Duwayne Heck,  and discussed lab and imaging findings as well as pertinent plan - they recommend: Admission to ICU   Problem List / ED Course / Critical interventions / Medication management  Patient is a 31 year old male presenting for new onset seizures today.  History is provided by his mother, who is with him throughout the day.  She reports that he had 3 witnessed seizures at home.  She describes these as loss of consciousness, leg stiffening, and convulsive movement in bilateral legs.  He did not have returned to mental baseline in between seizure episodes.  She denies any known history of epilepsy.  She states that he does drink alcohol but only approximately 3 beers per day.  She believes that his last drink was this morning.  He does use marijuana but she denies any other illicit substance use.  On arrival in the ED, there was  concern of active seizure, although I did not witness this.  2 mg of Ativan were given.  On my initial assessment, patient is a GCS of 11.  He is ill-appearing.  Vital signs are notable for tachycardia, tachypnea, and a rectal temperature of 101.9 degrees.  He does appear to have pain with passive neck flexion.  Presentation is concerning for meningitis.  Septic work-up was initiated.  Patient was given 2 mg of Ativan.  30 cc/kg of IV fluids was ordered.  Empiric antibiotic treatment for meningitis was initiated.  Patient's mother consented for lumbar puncture and this was performed at bedside.  I spoke with intensivist on-call, Dr. Duwayne Heck, who does recommend ICU admission for close observation.  I also spoke with neurologist on-call, Dr. Rory Percy who recommends transfer to [facility] for EEG capabilities.  Patient had no further seizure activity while in the ED.  Vital signs improved following IV fluids and antipyresis. I ordered medication including Ativan for seizures and anxiolysis during LP; IV fluids for tachycardia and concern of sepsis; broad-spectrum antibiotics for empiric treatment of meningitis; Tylenol for antipyresis Reevaluation of the patient after these medicines showed that the patient improved I have reviewed the patients home medicines and have made adjustments as needed   Social Determinants of Health:  Able to live independently at baseline  CRITICAL CARE Performed by: Godfrey Pick   Total critical care time: 45 minutes  Critical care time was exclusive of separately billable procedures and treating other patients.  Critical care was necessary to treat or prevent imminent or life-threatening deterioration.  Critical care was time spent personally by me on the following activities: development of treatment plan with patient and/or surrogate as well as nursing, discussions with consultants, evaluation of patient's response to treatment, examination of patient, obtaining history  from patient or surrogate, ordering and performing treatments and interventions, ordering and review of laboratory studies, ordering and review of radiographic studies, pulse oximetry and re-evaluation of patient's condition.         Final Clinical Impression(s) / ED Diagnoses Final diagnoses:  Seizure ([place])  Fever in adult  Glasgow coma scale total score 9-12, at arrival to emergency department    Rx / [place] Orders ED Discharge Orders     None         Godfrey Pick, MD 03/02/22 [phone]

## 2022-03-02 ENCOUNTER — Inpatient Hospital Stay (HOSPITAL_COMMUNITY): Payer: Medicaid Other

## 2022-03-02 DIAGNOSIS — G9340 Encephalopathy, unspecified: Secondary | ICD-10-CM | POA: Diagnosis not present

## 2022-03-02 DIAGNOSIS — R569 Unspecified convulsions: Secondary | ICD-10-CM

## 2022-03-02 DIAGNOSIS — R402422 Glasgow coma scale score 9-12, at arrival to emergency department: Secondary | ICD-10-CM

## 2022-03-02 DIAGNOSIS — R509 Fever, unspecified: Secondary | ICD-10-CM

## 2022-03-02 DIAGNOSIS — G40909 Epilepsy, unspecified, not intractable, without status epilepticus: Secondary | ICD-10-CM

## 2022-03-02 LAB — CBC WITH DIFFERENTIAL/PLATELET
Abs Immature Granulocytes: 0.05 10*3/uL (ref 0.00–0.07)
Basophils Absolute: 0 10*3/uL (ref 0.0–0.1)
Basophils Relative: 0 %
Eosinophils Absolute: 0 10*3/uL (ref 0.0–0.5)
Eosinophils Relative: 0 %
HCT: 26.8 % — ABNORMAL LOW (ref 39.0–52.0)
Hemoglobin: 8 g/dL — ABNORMAL LOW (ref 13.0–17.0)
Immature Granulocytes: 1 %
Lymphocytes Relative: 16 %
Lymphs Abs: 1.4 10*3/uL (ref 0.7–4.0)
MCH: 23.5 pg — ABNORMAL LOW (ref 26.0–34.0)
MCHC: 29.9 g/dL — ABNORMAL LOW (ref 30.0–36.0)
MCV: 78.6 fL — ABNORMAL LOW (ref 80.0–100.0)
Monocytes Absolute: 0.4 10*3/uL (ref 0.1–1.0)
Monocytes Relative: 4 %
Neutro Abs: 7.2 10*3/uL (ref 1.7–7.7)
Neutrophils Relative %: 79 %
Platelets: 243 10*3/uL (ref 150–400)
RBC: 3.41 MIL/uL — ABNORMAL LOW (ref 4.22–5.81)
RDW: 23 % — ABNORMAL HIGH (ref 11.5–15.5)
WBC: 9 10*3/uL (ref 4.0–10.5)
nRBC: 0 % (ref 0.0–0.2)

## 2022-03-02 LAB — BASIC METABOLIC PANEL
Anion gap: 16 — ABNORMAL HIGH (ref 5–15)
BUN: 5 mg/dL — ABNORMAL LOW (ref 6–20)
CO2: 19 mmol/L — ABNORMAL LOW (ref 22–32)
Calcium: 8.8 mg/dL — ABNORMAL LOW (ref 8.9–10.3)
Chloride: 103 mmol/L (ref 98–111)
Creatinine, Ser: 0.83 mg/dL (ref 0.61–1.24)
GFR, Estimated: >60 mL/min (ref >60)
Glucose, Bld: 126 mg/dL — ABNORMAL HIGH (ref 70–99)
Potassium: 3.1 mmol/L — ABNORMAL LOW (ref 3.5–5.1)
Sodium: 138 mmol/L (ref 135–145)

## 2022-03-02 LAB — LACTIC ACID, PLASMA
Lactic Acid, Venous: 7.5 mmol/L (ref 0.5–1.9)
Lactic Acid, Venous: 3.2 mmol/L (ref 0.5–1.9)

## 2022-03-02 LAB — CSF CELL COUNT WITH DIFFERENTIAL
RBC Count, CSF: 217 /mm3 — ABNORMAL HIGH
RBC Count, CSF: 3 /mm3 — ABNORMAL HIGH
Tube #: 1
Tube #: 4
WBC, CSF: 2 /mm3 (ref 0–5)
WBC, CSF: 3 /mm3 (ref 0–5)

## 2022-03-02 LAB — MRSA NEXT GEN BY PCR, NASAL: MRSA by PCR Next Gen: NOT DETECTED

## 2022-03-02 LAB — BLOOD GAS, VENOUS
Acid-base deficit: 8.3 mmol/L — ABNORMAL HIGH (ref 0.0–2.0)
Bicarbonate: 15.7 mmol/L — ABNORMAL LOW (ref 20.0–28.0)
O2 Saturation: 40.6 %
Patient temperature: 37
pCO2, Ven: 26 mmHg — ABNORMAL LOW (ref 44–60)
pH, Ven: 7.39 (ref 7.25–7.43)
pO2, Ven: <31 mmHg — CL (ref 32–45)

## 2022-03-02 LAB — GLUCOSE, CAPILLARY
Glucose-Capillary: 102 mg/dL — ABNORMAL HIGH (ref 70–99)
Glucose-Capillary: 109 mg/dL — ABNORMAL HIGH (ref 70–99)
Glucose-Capillary: 118 mg/dL — ABNORMAL HIGH (ref 70–99)
Glucose-Capillary: 148 mg/dL — ABNORMAL HIGH (ref 70–99)
Glucose-Capillary: 58 mg/dL — ABNORMAL LOW (ref 70–99)
Glucose-Capillary: 63 mg/dL — ABNORMAL LOW (ref 70–99)
Glucose-Capillary: 80 mg/dL (ref 70–99)

## 2022-03-02 LAB — RAPID URINE DRUG SCREEN, HOSP PERFORMED
Amphetamines: NOT DETECTED
Barbiturates: NOT DETECTED
Benzodiazepines: POSITIVE — AB
Cocaine: POSITIVE — AB
Opiates: NOT DETECTED
Tetrahydrocannabinol: POSITIVE — AB

## 2022-03-02 LAB — RESP PANEL BY RT-PCR (FLU A&B, COVID) ARPGX2
Influenza A by PCR: NEGATIVE
Influenza B by PCR: NEGATIVE
SARS Coronavirus 2 by RT PCR: NEGATIVE

## 2022-03-02 LAB — PROTEIN, CSF: Total  Protein, CSF: <6 mg/dL — ABNORMAL LOW (ref 15–45)

## 2022-03-02 LAB — URINALYSIS, ROUTINE W REFLEX MICROSCOPIC
Bacteria, UA: NONE SEEN
Bilirubin Urine: NEGATIVE
Glucose, UA: NEGATIVE mg/dL
Ketones, ur: 20 mg/dL — AB
Leukocytes,Ua: NEGATIVE
Nitrite: NEGATIVE
Protein, ur: NEGATIVE mg/dL
Specific Gravity, Urine: 1.013 (ref 1.005–1.030)
pH: 5 (ref 5.0–8.0)

## 2022-03-02 LAB — TSH: TSH: 1.889 u[IU]/mL (ref 0.350–4.500)

## 2022-03-02 LAB — MAGNESIUM: Magnesium: 1.7 mg/dL (ref 1.7–2.4)

## 2022-03-02 LAB — TROPONIN I (HIGH SENSITIVITY): Troponin I (High Sensitivity): 9 ng/L (ref <18)

## 2022-03-02 LAB — AMMONIA: Ammonia: 19 umol/L (ref 9–35)

## 2022-03-02 LAB — GLUCOSE, CSF: Glucose, CSF: 76 mg/dL — ABNORMAL HIGH (ref 40–70)

## 2022-03-02 LAB — PROCALCITONIN: Procalcitonin: 0.12 ng/mL

## 2022-03-02 LAB — ETHANOL: Alcohol, Ethyl (B): <10 mg/dL (ref <10)

## 2022-03-02 LAB — HIV ANTIBODY (ROUTINE TESTING W REFLEX): HIV Screen 4th Generation wRfx: NONREACTIVE

## 2022-03-02 MED ORDER — SODIUM CHLORIDE 0.9 % IV SOLN
2000.0000 mg | INTRAVENOUS | Status: DC
  Filled 2022-03-02: qty 20

## 2022-03-02 MED ORDER — DEXTROSE 50 % IV SOLN
12.5000 g | INTRAVENOUS | Status: AC
  Administered 2022-03-02: 12.5 g via INTRAVENOUS

## 2022-03-02 MED ORDER — LEVETIRACETAM IN NACL 1000 MG/100ML IV SOLN
1000.0000 mg | INTRAVENOUS | Status: AC
  Administered 2022-03-02: 1000 mg via INTRAVENOUS
  Filled 2022-03-02: qty 100

## 2022-03-02 MED ORDER — PANTOPRAZOLE SODIUM 40 MG IV SOLR
40.0000 mg | INTRAVENOUS | Status: DC
  Administered 2022-03-02 – 2022-03-06 (×5): 40 mg via INTRAVENOUS
  Filled 2022-03-02 (×4): qty 10

## 2022-03-02 MED ORDER — DEXTROSE 5 % IV SOLN
500.0000 mg | Freq: Three times a day (TID) | INTRAVENOUS | Status: DC
  Administered 2022-03-02: 500 mg via INTRAVENOUS
  Filled 2022-03-02 (×3): qty 10

## 2022-03-02 MED ORDER — MOMETASONE FURO-FORMOTEROL FUM 100-5 MCG/ACT IN AERO
2.0000 | INHALATION_SPRAY | Freq: Two times a day (BID) | RESPIRATORY_TRACT | Status: DC
  Administered 2022-03-02 – 2022-03-10 (×18): 2 via RESPIRATORY_TRACT
  Filled 2022-03-02: qty 8.8

## 2022-03-02 MED ORDER — ALBUTEROL SULFATE (2.5 MG/3ML) 0.083% IN NEBU: 3.0000 mL | INHALATION_SOLUTION | RESPIRATORY_TRACT | Status: DC | PRN

## 2022-03-02 MED ORDER — THIAMINE HCL 100 MG/ML IJ SOLN
100.0000 mg | Freq: Every day | INTRAMUSCULAR | Status: DC
  Administered 2022-03-02 – 2022-03-10 (×9): 100 mg via INTRAVENOUS
  Filled 2022-03-02 (×9): qty 2

## 2022-03-02 MED ORDER — DEXTROSE 50 % IV SOLN
INTRAVENOUS | Status: AC
  Filled 2022-03-02: qty 50

## 2022-03-02 MED ORDER — ADULT MULTIVITAMIN W/MINERALS CH
1.0000 | ORAL_TABLET | Freq: Every day | ORAL | Status: DC
  Administered 2022-03-05 – 2022-03-10 (×6): 1 via ORAL
  Filled 2022-03-02 (×6): qty 1

## 2022-03-02 MED ORDER — LEVETIRACETAM IN NACL 1000 MG/100ML IV SOLN
1000.0000 mg | Freq: Once | INTRAVENOUS | Status: AC
  Administered 2022-03-02: 1000 mg via INTRAVENOUS
  Filled 2022-03-02: qty 100

## 2022-03-02 MED ORDER — DEXTROSE 50 % IV SOLN: 12.5000 g | INTRAVENOUS | Status: AC

## 2022-03-02 MED ORDER — IOHEXOL 300 MG/ML  SOLN
100.0000 mL | Freq: Once | INTRAMUSCULAR | Status: AC | PRN
  Administered 2022-03-02: 100 mL via INTRAVENOUS

## 2022-03-02 MED ORDER — ORAL CARE MOUTH RINSE: 15.0000 mL | OROMUCOSAL | Status: DC | PRN

## 2022-03-02 MED ORDER — DEXTROSE 50 % IV SOLN
INTRAVENOUS | Status: AC
  Administered 2022-03-02: 12.5 g via INTRAVENOUS
  Filled 2022-03-02: qty 50

## 2022-03-02 MED ORDER — FENTANYL CITRATE (PF) 100 MCG/2ML IJ SOLN
25.0000 ug | INTRAMUSCULAR | Status: DC | PRN
  Administered 2022-03-02: 25 ug via INTRAVENOUS
  Filled 2022-03-02: qty 2

## 2022-03-02 MED ORDER — GADOBUTROL 1 MMOL/ML IV SOLN
5.0000 mL | Freq: Once | INTRAVENOUS | Status: AC | PRN
  Administered 2022-03-02: 5 mL via INTRAVENOUS

## 2022-03-02 MED ORDER — MAGNESIUM SULFATE 2 GM/50ML IV SOLN
2.0000 g | Freq: Once | INTRAVENOUS | Status: AC
  Administered 2022-03-02: 2 g via INTRAVENOUS
  Filled 2022-03-02: qty 50

## 2022-03-02 MED ORDER — FOLIC ACID 5 MG/ML IJ SOLN
1.0000 mg | Freq: Every day | INTRAMUSCULAR | Status: DC
  Administered 2022-03-02 – 2022-03-10 (×9): 1 mg via INTRAVENOUS
  Filled 2022-03-02 (×10): qty 0.2

## 2022-03-02 MED ORDER — THIAMINE HCL 100 MG PO TABS: 100.0000 mg | ORAL_TABLET | Freq: Every day | ORAL | Status: DC

## 2022-03-02 MED ORDER — ORAL CARE MOUTH RINSE
15.0000 mL | OROMUCOSAL | Status: DC
  Administered 2022-03-02 – 2022-03-06 (×9): 15 mL via OROMUCOSAL

## 2022-03-02 MED ORDER — LORAZEPAM 2 MG/ML IJ SOLN
2.0000 mg | Freq: Once | INTRAMUSCULAR | Status: AC
  Administered 2022-03-02: 2 mg via INTRAVENOUS
  Filled 2022-03-02: qty 1

## 2022-03-02 MED ORDER — CHLORHEXIDINE GLUCONATE CLOTH 2 % EX PADS
6.0000 | MEDICATED_PAD | Freq: Every day | CUTANEOUS | Status: DC
Start: 2022-03-02 — End: 2022-03-07
  Administered 2022-03-02 – 2022-03-06 (×4): 6 via TOPICAL

## 2022-03-02 MED ORDER — MIDAZOLAM HCL 2 MG/2ML IJ SOLN
1.0000 mg | INTRAMUSCULAR | Status: DC | PRN
  Administered 2022-03-06 – 2022-03-07 (×10): 2 mg via INTRAVENOUS
  Filled 2022-03-02 (×12): qty 2

## 2022-03-02 MED ORDER — FOLIC ACID 1 MG PO TABS: 1.0000 mg | ORAL_TABLET | Freq: Every day | ORAL | Status: DC

## 2022-03-02 MED ORDER — POTASSIUM CHLORIDE 10 MEQ/100ML IV SOLN
10.0000 meq | INTRAVENOUS | Status: AC
  Administered 2022-03-02 (×4): 10 meq via INTRAVENOUS
  Filled 2022-03-02 (×4): qty 100

## 2022-03-02 MED ORDER — LEVETIRACETAM IN NACL 500 MG/100ML IV SOLN
500.0000 mg | Freq: Two times a day (BID) | INTRAVENOUS | Status: DC
  Administered 2022-03-02 – 2022-03-07 (×11): 500 mg via INTRAVENOUS
  Filled 2022-03-02 (×11): qty 100

## 2022-03-02 MED ORDER — LORAZEPAM 2 MG/ML IJ SOLN
2.0000 mg | INTRAMUSCULAR | Status: DC | PRN
  Administered 2022-03-02 (×2): 2 mg via INTRAVENOUS
  Filled 2022-03-02 (×2): qty 1

## 2022-03-02 NOTE — Progress Notes (Signed)
Spot EEG prelim review with generalized slowing, maybe somewhat asymmetric but bo seizures. Continue LTM. Await full read in the AM.  -- Amie Portland, MD Neurologist [facility] Pager: [phone]

## 2022-03-02 NOTE — Progress Notes (Signed)
LTM EEG hooked up and running - no initial skin breakdown - push button tested - neuro notified. [facility] monitoring.  

## 2022-03-02 NOTE — TOC Progression Note (Signed)
Transition of Care [facility]) - Initial/Assessment Note    Patient Details  Name: Timothy Hubbard MRN: [id number] Date of Birth: [birth year]  Transition of Care [facility]) CM/SW Contact:    Milinda Antis, [place] Phone Number: 03/02/2022, 11:23 AM  Clinical Narrative:                  Transition of Care Department [facility]) has reviewed patient.  Patient admitted for multiple seizure like episodes and AMS.  Patient also has a hx of  ETOH and THC use.  We will continue to monitor patient advancement through interdisciplinary progression rounds and will assess patient when patient becomes more oriented.      Patient Goals and CMS Choice        Expected Discharge Plan and Services                                                Prior Living Arrangements/Services                       Activities of Daily Living      Permission Sought/Granted                  Emotional Assessment              Admission diagnosis:  Seizure ([place]) [R56.9] Encephalopathy acute [G93.40] Fever in adult [R50.9] Glasgow coma scale total score 9-12, at arrival to emergency department [R40.2422] Patient Active Problem List   Diagnosis Date Noted   Fever in adult    Seizure [facility])    Encephalopathy acute 03/01/2022   Alcohol use disorder 10/21/2016   Gastroesophageal reflux disease 06/13/2016   Essential hypertension 04/01/2016   Asthma in adult, moderate persistent, with acute exacerbation 04/01/2016   TOBACCO USER 06/13/2009   PCP:  Alcus Dad, MD Pharmacy:   [facility], [facility] [place] [place] [address] Phone: [phone] Fax: [place], [place] - [address] AT [facility] [place] [address] Phone: [phone] Fax: [phone]     Social Determinants of Health (SDOH) Interventions    Readmission Risk Interventions      No data to display

## 2022-03-02 NOTE — Progress Notes (Signed)
EEG complete - results pending 

## 2022-03-02 NOTE — Sepsis Progress Note (Signed)
Sepsis bundle complete.  Pt admitted to ICU, monitoring to be continued by bedside RN  

## 2022-03-02 NOTE — Consult Note (Signed)
Neurology Consultation  Reason for Consult: seizures Referring Physician: Dr Doren Custard, ER. Dr Duwayne Heck, PCCM  CC: seizures  History is obtained from: chart  HPI: Timothy Hubbard is a 31 y.o. male with past medical history of hypertension, alcohol abuse, transferred from [facility] emergency room where he presented with multiple seizure-like episodes.  Per the chart review, he drinks a few beers every day.  This evening, he was complaining of some pain on the side and in the evening mother saw him coming out of the bathroom and he appeared to be off balance, then laid down in bed and subsequently had what was described in the ED provider note as a generalized tonic-clonic seizure that lasted approximately 7 minutes following which she was confused.  1 hour later patient had another seizure and had another seizure an hour after that 1 for a total of 3 seizures.  The family brought him to the emergency room after the third seizure. ED provider notes mentions noncompliance with antihypertensives. Also mentions marijuana use but no other illicit drug use per family. In the emergency room, remained a GCS of 11.  Labs showed elevated lactate and mild leukocytosis.  Had some neck stiffness on examination.  Empiric meningitic/encephalitic antibiotics and antiviral started.  LP performed-results pending.  Admitted to critical care service. I was contacted over the phone and recommended loading him with Keppra and starting Keppra 500 twice daily. Patient was seen by me upon arrival at [facility] ICU  ROS: Unable to obtain due to altered mental status.   Past Medical History:  Diagnosis Date   Asthma    HTN (hypertension)    Family History  Problem Relation Age of Onset   Hypertension Mother    Cancer Maternal Grandfather        doesn't know the type   Social History:   reports that he has been smoking cigarettes. He started smoking about 14 years ago. He has a 4.00 pack-year smoking history. He has  never used smokeless tobacco. He reports current alcohol use of about 2.0 standard drinks of alcohol per week. He reports current drug use. Frequency: 7.00 times per week. Drug: Marijuana.  Medications  Current Facility-Administered Medications:    acyclovir (ZOVIRAX) 500 mg in dextrose 5 % 100 mL IVPB, 500 mg, Intravenous, Q8H, Bell, Michelle T, RPH   Chlorhexidine Gluconate Cloth 2 % PADS 6 each, 6 each, Topical, Q0600, Collier Bullock, MD   docusate sodium (COLACE) capsule 100 mg, 100 mg, Oral, BID PRN, Collier Bullock, MD   enoxaparin (LOVENOX) injection 40 mg, 40 mg, Subcutaneous, QHS, Collier Bullock, MD   lactated ringers bolus 1,000 mL, 1,000 mL, Intravenous, Once, Collier Bullock, MD   [COMPLETED] lactated ringers bolus 1,000 mL, 1,000 mL, Intravenous, Once, Last Rate: 2,000 mL/hr at 03/01/22 2148, 1,000 mL at 03/01/22 2148 **AND** lactated ringers bolus 250 mL, 250 mL, Intravenous, Once, Godfrey Pick, MD   lactated ringers infusion, , Intravenous, Continuous, Godfrey Pick, MD, Last Rate: 150 mL/hr at 03/01/22 2331, New Bag at 03/01/22 2331   levETIRAcetam (KEPPRA) IVPB 500 mg/100 mL premix, 500 mg, Intravenous, Q12H, Collier Bullock, MD   polyethylene glycol (MIRALAX / GLYCOLAX) packet 17 g, 17 g, Oral, Daily PRN, Collier Bullock, MD   Exam: Current vital signs: BP (!) 149/106   Pulse (!) 107   Temp 98 F (36.7 C) (Oral)   Resp (!) 27   Ht '5\' 4"'$  (1.626 m)   Wt 55 kg   SpO2 100%  BMI 20.81 kg/m  Vital signs in last 24 hours: Temp:  [98 F (36.7 C)-101.4 F (38.6 C)] 98 F (36.7 C) (06/28 0142) Pulse Rate:  [80-114] 107 (06/28 0131) Resp:  [17-32] 27 (06/28 0045) BP: (137-162)/(94-110) 149/106 (06/28 0131) SpO2:  [98 %-100 %] 100 % (06/28 0131) Weight:  [54.4 kg-55 kg] 55 kg (06/28 0131) General: Awake and confused looking HEENT: Normocephalic atraumatic, mild neck stiffness Lungs: Clear Cardiovascular: Regular rate rhythm Abdomen nondistended  nontender Extremities warm well perfused Neurological exam He is awake, tracks the examiner inconsistently, does not follow commands. Nonverbal Cranial nerves: Pupils equal round react light, does not blink to threat from either side consistently, extraocular movement appear unhindered, face appears grossly symmetric. Motor examination: Not moving extremities spontaneously but to noxious stimulation strong withdrawal in all fours. Sensory exam: Mild noxious stimulation in all extremities leads to strong and purposeful withdrawal. Coordination difficult to assess given his mentation Gait testing deferred  Labs I have reviewed labs in epic and the results pertinent to this consultation are:  CBC    Component Value Date/Time   WBC 12.8 (H) 03/01/2022 2133   RBC 3.85 (L) 03/01/2022 2133   HGB 8.7 (L) 03/01/2022 2133   HCT 30.9 (L) 03/01/2022 2133   PLT 305 03/01/2022 2133   MCV 80.3 03/01/2022 2133   MCH 22.6 (L) 03/01/2022 2133   MCHC 28.2 (L) 03/01/2022 2133   RDW 23.1 (H) 03/01/2022 2133   LYMPHSABS 1.6 03/01/2022 2133   MONOABS 1.6 (H) 03/01/2022 2133   EOSABS 0.0 03/01/2022 2133   BASOSABS 0.0 03/01/2022 2133    CMP     Component Value Date/Time   NA 141 03/01/2022 2121   NA 142 02/04/2020 1101   K 3.9 03/01/2022 2121   CL 103 03/01/2022 2121   CO2 12 (L) 03/01/2022 2121   GLUCOSE 119 (H) 03/01/2022 2121   BUN 10 03/01/2022 2121   BUN 8 02/04/2020 1101   CREATININE 1.29 (H) 03/01/2022 2121   CREATININE 1.03 10/19/2016 1017   CALCIUM 10.0 03/01/2022 2121   PROT 7.2 03/01/2022 2121   ALBUMIN 4.1 03/01/2022 2121   AST 57 (H) 03/01/2022 2121   ALT <5 03/01/2022 2121   ALKPHOS 64 03/01/2022 2121   BILITOT 1.3 (H) 03/01/2022 2121   GFRNONAA >60 03/01/2022 2121   GFRNONAA >89 10/19/2016 1017   GFRAA 138 02/04/2020 1101   GFRAA >89 10/19/2016 1017    Imaging I have reviewed the images obtained:  CT-head: No acute process Chest x-ray: No acute  process  Assessment:  31 year old man with above past medical history with new onset of multiple seizures, on examination neck stiffness as well as laboratory findings of lactic acidosis and leukocytosis raising concern for CNS infection. No further clinical episodes noted in the ER.  Transferred to ICU for close observation.  Remains extremely encephalopathic hours after last seizure.  Might be related to underlying CNS infection versus prolonged postictal state versus toxic metabolic encephalopathy from possible substance use although history provided by family was only of marijuana abuse  Impression: New onset seizures Evaluate for CNS infection Evaluate for toxic metabolic causes of possible seizures Toxic metabolic encephalopathy-history of marijuana abuse in the past, unclear history of other substance abuse-needs further evaluation.  Recommendations: Stat EEG followed by LTM EEG to rule out nonconvulsive status epilepticus. Check ammonia level Already loaded with Keppra 2 g IV at the ER-continue Keppra 500 twice daily Maintain seizure precautions Await CSF results Continue empiric antibiotic and antiviral  coverage with vancomycin, ceftriaxone and acyclovir. Check urinary toxicology screen-May in and out catheterize if needed. Check urinalysis MRI of the brain with and without contrast when able to-would recommend doing that after a few hours of LTM EEG so that the leads can be taken off.  Plan discussed with ED provider and PCCM attending preliminarily when the consult was called.  Neurology will continue to follow  -- Amie Portland, MD Neurologist [facility] Pager: [phone]  [place] Performed by: Amie Portland, MD Total critical care time: 33 minutes Critical care time was exclusive of separately billable procedures and treating other patients and/or supervising APPs/Residents/Students Critical care was necessary to treat or prevent  imminent or life-threatening deterioration due to new onset seizure, possible status epilepticus, toxic metabolic encephalopathy This patient is critically ill and at significant risk for neurological worsening and/or death and care requires constant monitoring. Critical care was time spent personally by me on the following activities: development of treatment plan with patient and/or surrogate as well as nursing, discussions with consultants, evaluation of patient's response to treatment, examination of patient, obtaining history from patient or surrogate, ordering and performing treatments and interventions, ordering and review of laboratory studies, ordering and review of radiographic studies, pulse oximetry, re-evaluation of patient's condition, participation in multidisciplinary rounds and medical decision making of high complexity in the care of this patient.

## 2022-03-02 NOTE — H&P (Addendum)
NAME:  Timothy Hubbard, MRN:  [id number], DOB:  1991/09/04, LOS: 1 ADMISSION DATE:  03/01/2022, CONSULTATION DATE:  03/01/22   REFERRING MD:  Doren Custard, CHIEF COMPLAINT:  seizure, AMS    History of Present Illness:  Timothy Hubbard is a 31 yo man with a hx of HTN, asthma, GERD, ETOH use, here after multiple seizure like episodes and AMS.   He was normal, then came out of the bathroom stumbling and fell, no head trauma. Then had teeth grinding and apparent seizure activity for 7 min.  Was altered, lethargic and confised afterwards.  Additional seizure about 1 hour later, and another 1 hr later.  Brought to the ED following the third episode.    Typically non compliant with medications intermittently.   Per history from his mother, he has been acting and feeling normal as far as she knows (he doesn't complain to her much and keeps his medical issues private from her).  Some R side pain recently.   No hx of etoh withdrawal.    Pertinent  Medical History  Etoh: drinks 3 beers per day, mother thinks he has cut down to 1 drink per day.  No hx of withdrawals  Marijuana use daily Hx of bipolar d/o, schizophrenia.    Significant Hospital Events: Including procedures, antibiotic start and stop dates in addition to other pertinent events     Interim History / Subjective:    Objective   Blood pressure (!) 141/108, pulse (!) 108, temperature 99.3 F (37.4 C), temperature source Rectal, resp. rate (!) 27, height '5\' 4"'$  (1.626 m), weight 54.4 kg, SpO2 100 %.        Intake/Output Summary (Last 24 hours) at 03/02/2022 0122 Last data filed at 03/02/2022 0040 Gross per 24 hour  Intake 1100.38 ml  Output --  Net 1100.38 ml   Filed Weights   03/01/22 2222  Weight: 54.4 kg    Examination: General: lethargic, opens eyes to voice, non verbal but follows simple commands  HENT: NCAT, perrl  Lungs: CTAB Cardiovascular: RRR no mgr  Abdomen: Nt, nd, nbs  Extremities: no edema.  Movving all extremities 4/5  stregth Neuro: sleepy but arousable, eye contact, folows simple commands  GU:   CT head negative   Resolved Hospital Problem list    Assessment & Plan:  Acute encephalopathy - possible seizures, unclear cause.  Not exhibiting other  signs of meningitis, but on emipirc tx for now.  LP results pending.  Does not appear c/w with Etoh withdrawal.   Neuro consult - [facility].   EEG pending.   Starting Keppra.   Lactic acidosis: could be 2/2 seizures.  Recheck.    Check VBG now.    HTN: hold po meds now  GERD: protonix.   Best Practice (right click and "Reselect all SmartList Selections" daily)   Diet/type: NPO DVT prophylaxis: lovenox  GI prophylaxis: PPI Lines: N/A Foley:  N/A Code Status:  full code Last date of multidisciplinary goals of care discussion '[]'$   Labs   CBC: Recent Labs  Lab 03/01/22 2133  WBC 12.8*  NEUTROABS 9.5*  HGB 8.7*  HCT 30.9*  MCV 80.3  PLT 465    Basic Metabolic Panel: Recent Labs  Lab 03/01/22 2121  NA 141  K 3.9  CL 103  CO2 12*  GLUCOSE 119*  BUN 10  CREATININE 1.29*  CALCIUM 10.0  MG 2.0   GFR: Estimated Creatinine Clearance: 63.3 mL/min (A) (by C-G formula based on  SCr of 1.29 mg/dL (H)). Recent Labs  Lab 03/01/22 2122 03/01/22 2133  WBC  --  12.8*  LATICACIDVEN >9.0*  --     Liver Function Tests: Recent Labs  Lab 03/01/22 2121  AST 57*  ALT <5  ALKPHOS 64  BILITOT 1.3*  PROT 7.2  ALBUMIN 4.1   No results for input(s): "LIPASE", "AMYLASE" in the last 168 hours. No results for input(s): "AMMONIA" in the last 168 hours.  ABG No results found for: "PHART", "PCO2ART", "PO2ART", "HCO3", "TCO2", "ACIDBASEDEF", "O2SAT"   Coagulation Profile: Recent Labs  Lab 03/01/22 2133  INR 0.9    Cardiac Enzymes: No results for input(s): "CKTOTAL", "CKMB", "CKMBINDEX", "TROPONINI" in the last 168 hours.  HbA1C: No results found for: "HGBA1C"  CBG: Recent Labs  Lab 03/01/22 2123  GLUCAP 126*     Review of Systems:   Unable to assess  Past Medical History:  He,  has a past medical history of Asthma and HTN (hypertension).   Surgical History:  No past surgical history on file.   Social History:   reports that he has been smoking cigarettes. He started smoking about 14 years ago. He has a 4.00 pack-year smoking history. He has never used smokeless tobacco. He reports current alcohol use of about 2.0 standard drinks of alcohol per week. He reports current drug use. Frequency: 7.00 times per week. Drug: Marijuana.   Family History:  His family history includes Cancer in his maternal grandfather; Hypertension in his mother.   Allergies Allergies  Allergen Reactions   Ibuprofen Anaphylaxis     Home Medications  Prior to Admission medications   Medication Sig Start Date End Date Taking? Authorizing Provider  albuterol (VENTOLIN HFA) 108 (90 Base) MCG/ACT inhaler Inhale 2 puffs into the lungs every 6 (six) hours as needed for wheezing or shortness of breath. 06/27/21   Alcus Dad, MD  amLODipine (NORVASC) 10 MG tablet TAKE 1 TABLET BY MOUTH DAILY AT BEDTIME. You must be seen in the office prior to any additional refills. Please call for an appointment 01/06/22   Alcus Dad, MD  budesonide-formoterol [facility]) 80-4.5 MCG/ACT inhaler INHALE 2 PUFFS INTO THE LUNGS DAILY 06/19/21   Alcus Dad, MD  famotidine (PEPCID) 20 MG tablet TAKE 1 TABLET(20 MG) BY MOUTH TWICE DAILY 11/10/20   Alcus Dad, MD  methocarbamol (ROBAXIN) 500 MG tablet Take 1 tablet (500 mg total) by mouth 2 (two) times daily. 01/01/21   Hughie Closs, PA-C  nicotine (EQ NICOTINE) 14 mg/24hr patch Place 1 patch (14 mg total) onto the skin daily. 04/01/16   Mercy Riding, MD  omeprazole (PRILOSEC) 20 MG capsule TAKE 1 CAPSULE(20 MG) BY MOUTH DAILY AS NEEDED FOR ACID REFLUX. Patient needs to be seen in the office prior to receiving any additional refills. 07/23/21   Alcus Dad, MD      Critical care time: 60 min.

## 2022-03-02 NOTE — Progress Notes (Signed)
Pharmacy Antibiotic Note  Timothy Hubbard is a 31 y.o. male admitted on 03/01/2022 with rule out meningitis.  Pharmacy has been consulted for acyclovir dosing.  Plan: Acyclovir 10 mg/kg IV q8h - 500 mg IV q8h Per MD pt already on IVF F/u renal function & CSF results  Height: '5\' 4"'$  (162.6 cm) Weight: 54.4 kg (120 lb) IBW/kg (Calculated) : 59.2  Temp (24hrs), Avg:100.4 F (38 C), Min:99.3 F (37.4 C), Max:101.4 F (38.6 C)  Recent Labs  Lab 03/01/22 2121 03/01/22 2122 03/01/22 2133  WBC  --   --  12.8*  CREATININE 1.29*  --   --   LATICACIDVEN  --  >9.0*  --     Estimated Creatinine Clearance: 63.3 mL/min (A) (by C-G formula based on SCr of 1.29 mg/dL (H)).    Allergies  Allergen Reactions   Ibuprofen Anaphylaxis    Antimicrobials this admission: 6/27 CTX x 1 6/27 Vanc x 1  6/28 acyclovir>> Dose adjustments this admission:  Microbiology results: 6/27 CSF: ip 6/27 CSF VDRL ip 6/27 CSF HSV  6/27 BCx2 ip UCx ordered, not collected yet  Thank you for allowing pharmacy to be a part of this patient's care.   Eudelia Bunch, Pharm.D 03/02/2022 1:00 AM

## 2022-03-02 NOTE — Progress Notes (Signed)
NAME:  Timothy Hubbard, MRN:  [id number], DOB:  November 26, 1990, LOS: 1 ADMISSION DATE:  03/01/2022, CONSULTATION DATE:  03/01/22   REFERRING MD:  Doren Custard, CHIEF COMPLAINT:  seizure, AMS    History of Present Illness:  Mr. Brinkley is a 31 yo man with a hx of HTN, asthma, GERD, ETOH use, here after multiple seizure like episodes and AMS.   He was normal, then came out of the bathroom stumbling and fell, no head trauma. Then had teeth grinding and apparent seizure activity for 7 min.  Was altered, lethargic and confised afterwards.  Additional seizure about 1 hour later, and another 1 hr later.  Brought to the ED following the third episode.    Typically non compliant with medications intermittently.   Per history from his mother, he has been acting and feeling normal as far as she knows (he doesn't complain to her much and keeps his medical issues private from her).  Some R side pain recently.   No hx of etoh withdrawal.    Pertinent  Medical History  Etoh: drinks 3 beers per day, mother thinks he has cut down to 1 drink per day.  No hx of withdrawals  Marijuana use daily Hx of bipolar d/o, schizophrenia.    Significant Hospital Events: Including procedures, antibiotic start and stop dates in addition to other pertinent events   6/28 admitted to ICU with new onset sz activity, LTM, protecting his airway  Interim History / Subjective:   CTH negative Initial EEG read without clear seizure activity Remains encephalopathic Procal and initial LP results not suggestive of bacterial infection  Objective   Blood pressure (!) 134/94, pulse 96, temperature (!) 97.3 F (36.3 C), temperature source Oral, resp. rate (!) 23, height '5\' 4"'$  (1.626 m), weight 55 kg, SpO2 100 %.        Intake/Output Summary (Last 24 hours) at 03/02/2022 0732 Last data filed at 03/02/2022 0700 Gross per 24 hour  Intake 1484.04 ml  Output 250 ml  Net 1234.04 ml    Filed Weights   03/01/22 2222 03/02/22 0131  Weight: 54.4  kg 55 kg     General:  well-nourished M, sleeping, no distress HEENT: MM pink/moist, pupils equal and reactive Neuro: sleeping, arouses to voice and light sternal rub, moving upper extremities to command, not otherwise answering questions or interacting, spontaneously moving LE CV: s1s2 rrr, no m/r/g PULM:  clear bilaterally on RA GI: soft, non-distended, non-tender Extremities: warm/dry, no edema  Skin: no rashes or lesions    Labs reviewed:  Lactic acid down-trending from >9 to 3.2 No leukocytosis, procal 0.12 Mag 1.7. K 3.1 CSF Pro 67, glu 76, WBC 2  Resolved Hospital Problem list    Assessment & Plan:    New onset seizure activity Hx of ETOH use Acute Encephalopathy Low suspicion meningitis based on CSF results, procal low Possible ETOH withdrawal induced, per report had reduced intake recently -appreciate neurology recommendations, continue LTM and Keppra with seizure precautions and prn benzos -monitor mental status, protecting his airway but continued encephalopathy, ammonia WNL -TSH pending  AGMA -likely secondary to lactic acidosis from seizures, received IVF, continue to follow   Hypokalemia Hypomagnesemia K 3.3, Mag 1.7 -follow and replete prn   HTN -continue to hold home medications while encephalopathic  GERD -continue protonix.   Asthma, tobacco use -prn nebs, not currently wheezing  Best Practice (right click and "Reselect all SmartList Selections" daily)   Diet/type: NPO DVT prophylaxis: lovenox  GI prophylaxis:  PPI Lines: N/A Foley:  N/A Code Status:  full code Last date of multidisciplinary goals of care discussion '[]'$   pending  Labs   CBC: Recent Labs  Lab 03/01/22 2133 03/02/22 0640  WBC 12.8* 9.0  NEUTROABS 9.5* 7.2  HGB 8.7* 8.0*  HCT 30.9* 26.8*  MCV 80.3 78.6*  PLT 305 243     Basic Metabolic Panel: Recent Labs  Lab 03/01/22 2121 03/02/22 0640  NA 141 138  K 3.9 3.1*  CL 103 103  CO2 12* 19*  GLUCOSE 119*  126*  BUN 10 5*  CREATININE 1.29* 0.83  CALCIUM 10.0 8.8*  MG 2.0 1.7    GFR: Estimated Creatinine Clearance: 99.4 mL/min (by C-G formula based on SCr of 0.83 mg/dL). Recent Labs  Lab 03/01/22 0038 03/01/22 2122 03/01/22 2133 03/02/22 0030 03/02/22 0640  PROCALCITON  --   --   --  0.12  --   WBC  --   --  12.8*  --  9.0  LATICACIDVEN 7.5* >9.0*  --   --   --      Liver Function Tests: Recent Labs  Lab 03/01/22 2121  AST 57*  ALT <5  ALKPHOS 64  BILITOT 1.3*  PROT 7.2  ALBUMIN 4.1    No results for input(s): "LIPASE", "AMYLASE" in the last 168 hours. Recent Labs  Lab 03/02/22 0640  AMMONIA 19    ABG    Component Value Date/Time   HCO3 15.7 (L) 03/01/2022 0038   ACIDBASEDEF 8.3 (H) 03/01/2022 0038   O2SAT 40.6 03/01/2022 0038     Coagulation Profile: Recent Labs  Lab 03/01/22 2133  INR 0.9     Cardiac Enzymes: No results for input(s): "CKTOTAL", "CKMB", "CKMBINDEX", "TROPONINI" in the last 168 hours.  HbA1C: No results found for: "HGBA1C"  CBG: Recent Labs  Lab 03/01/22 2123 03/02/22 0127 03/02/22 0331  GLUCAP 126* 109* 148*     Review of Systems:   Unable to assess  Past Medical History:  He,  has a past medical history of Asthma and HTN (hypertension).   Surgical History:  No past surgical history on file.   Social History:   reports that he has been smoking cigarettes. He started smoking about 14 years ago. He has a 4.00 pack-year smoking history. He has never used smokeless tobacco. He reports current alcohol use of about 2.0 standard drinks of alcohol per week. He reports current drug use. Frequency: 7.00 times per week. Drug: Marijuana.   Family History:  His family history includes Cancer in his maternal grandfather; Hypertension in his mother.   Allergies Allergies  Allergen Reactions   Ibuprofen Anaphylaxis     Home Medications  Prior to Admission medications   Medication Sig Start Date End Date Taking? Authorizing  Provider  albuterol (VENTOLIN HFA) 108 (90 Base) MCG/ACT inhaler Inhale 2 puffs into the lungs every 6 (six) hours as needed for wheezing or shortness of breath. 06/27/21   Alcus Dad, MD  amLODipine (NORVASC) 10 MG tablet TAKE 1 TABLET BY MOUTH DAILY AT BEDTIME. You must be seen in the office prior to any additional refills. Please call for an appointment 01/06/22   Alcus Dad, MD  budesonide-formoterol [facility]) 80-4.5 MCG/ACT inhaler INHALE 2 PUFFS INTO THE LUNGS DAILY 06/19/21   Alcus Dad, MD  famotidine (PEPCID) 20 MG tablet TAKE 1 TABLET(20 MG) BY MOUTH TWICE DAILY 11/10/20   Alcus Dad, MD  methocarbamol (ROBAXIN) 500 MG tablet Take 1 tablet (500 mg total) by mouth  2 (two) times daily. 01/01/21   Hughie Closs, PA-C  nicotine (EQ NICOTINE) 14 mg/24hr patch Place 1 patch (14 mg total) onto the skin daily. 04/01/16   Mercy Riding, MD  omeprazole (PRILOSEC) 20 MG capsule TAKE 1 CAPSULE(20 MG) BY MOUTH DAILY AS NEEDED FOR ACID REFLUX. Patient needs to be seen in the office prior to receiving any additional refills. 07/23/21   Alcus Dad, MD     Critical care time: 35 minutes    CRITICAL CARE Performed by: Otilio Carpen June Vacha   Total critical care time: 35 minutes  Critical care time was exclusive of separately billable procedures and treating other patients.  Critical care was necessary to treat or prevent imminent or life-threatening deterioration.  Critical care was time spent personally by me on the following activities: development of treatment plan with patient and/or surrogate as well as nursing, discussions with consultants, evaluation of patient's response to treatment, examination of patient, obtaining history from patient or surrogate, ordering and performing treatments and interventions, ordering and review of laboratory studies, ordering and review of radiographic studies, pulse oximetry and re-evaluation of patient's condition.  Otilio Carpen Dejane Scheibe,  PA-C [facility] See Amion for pager If no response to pager , please call [phone] until 7pm After 7:00 pm call Elink  [phone]

## 2022-03-02 NOTE — Progress Notes (Signed)
vLTM discontinued  [facility] notified  No skin breakdown

## 2022-03-02 NOTE — Progress Notes (Signed)
Subjective: No clinical seizures overnight.  However per RN, patient has episodes of being more aware followed by snoring as if he is postictal.  ROS: Unable to obtain due to aphasia  Examination  Vital signs in last 24 hours: Temp:  [97.3 F (36.3 C)-101.4 F (38.6 C)] 98.8 F (37.1 C) (06/28 0730) Pulse Rate:  [80-114] 89 (06/28 1100) Resp:  [17-32] 28 (06/28 1100) BP: (105-162)/(70-112) 128/91 (06/28 1100) SpO2:  [98 %-100 %] 100 % (06/28 1100) Weight:  [54.4 kg-55 kg] 55 kg (06/28 0131)  General: lying in bed, not in apparent distress Neuro: Opens eyes to verbal stimulation, follows simple one-step commands, nods appropriately but unable to name objects or repeat (expressive aphasia), PERRLA, no gaze deviation, able to track examiner in the room, no nystagmus, no apparent facial asymmetry, spontaneously moving all 4 extremities, 3+ reflexes in all extremities without ankle clonus  Basic Metabolic Panel: Recent Labs  Lab 03/01/22 2121 03/02/22 0640  NA 141 138  K 3.9 3.1*  CL 103 103  CO2 12* 19*  GLUCOSE 119* 126*  BUN 10 5*  CREATININE 1.29* 0.83  CALCIUM 10.0 8.8*  MG 2.0 1.7    CBC: Recent Labs  Lab 03/01/22 2133 03/02/22 0640  WBC 12.8* 9.0  NEUTROABS 9.5* 7.2  HGB 8.7* 8.0*  HCT 30.9* 26.8*  MCV 80.3 78.6*  PLT 305 243     Coagulation Studies: Recent Labs    03/01/22 2133  LABPROT 12.1  INR 0.9    Imaging CT head without contrast 03/01/2022:No acute intracranial abnormality   ASSESSMENT AND PLAN: 31 year old male with history of substance use disorder who presented with new onset seizures and continues to be aphasic.  New onset seizures Cocaine use disorder Cannabis use disorder Acute encephalopathy -Patient continues to be aphasic.  He is otherwise able to wake up and follow commands. Differentials include RCVS versus stroke versus less likely postictal state versus substance abuse  Recommendations -We will obtain CTA head and neck to  look for cerebral vasospasm-we will obtain MRI brain with and without contrast to look for acute abnormality -DC LTM EEG to obtain imaging.  Will need to reassess after MRI -Continue Keppra 500 mg twice daily -We will need substance abuse counseling once more awake -As needed IV Ativan for clinical seizures -Management of rest of comorbidities per primary team -Discussed plan with RN at bedside as well as critical care team via secure chat  CRITICAL CARE Performed by: Lora Havens   Total critical care time: 55 minutes  Critical care time was exclusive of separately billable procedures and treating other patients.  Critical care was necessary to treat or prevent imminent or life-threatening deterioration.  Critical care was time spent personally by me on the following activities: development of treatment plan with patient and/or surrogate as well as nursing, discussions with consultants, evaluation of patient's response to treatment, examination of patient, obtaining history from patient or surrogate, ordering and performing treatments and interventions, ordering and review of laboratory studies, ordering and review of radiographic studies, pulse oximetry and re-evaluation of patient's condition.   Zeb Comfort Epilepsy [facility] For questions after 5pm please refer to AMION to reach the Neurologist on call

## 2022-03-02 NOTE — ED Notes (Signed)
[facility] has been arranged.  Report given to [facility].

## 2022-03-02 NOTE — Procedures (Addendum)
MRN: [id number]  Epilepsy Attending: Lora Havens  Referring Physician/Provider: Amie Portland, MD  Duration: 03/02/2022 0321 to 18   Patient history: 31 year old man with new onset of multiple seizures. EEG to evaluate for seizure.   Level of alertness: lethargic    AEDs during EEG study: LEV   Technical aspects: This EEG study was done with scalp electrodes positioned according to the 10-20 International system of electrode placement. Electrical activity was acquired at a sampling rate of '500Hz'$  and reviewed with a high frequency filter of '70Hz'$  and a low frequency filter of '1Hz'$ . EEG data were recorded continuously and digitally stored.    Description: EEG showed continuous generalized 2 to 3 Hz delta slowing admixed with an excessive amount of 15 to 18 Hz beta activity distributed symmetrically and diffusely. Hyperventilation and photic stimulation were not performed.      ABNORMALITY - Continuous slow, generalized - Excessive beta, generalized   IMPRESSION: This study is suggestive of moderate to severe diffuse encephalopathy, nonspecific etiology but likely related to sedation. No seizures or epileptiform discharges were seen throughout the recording.   Sue Fernicola Barbra Sarks

## 2022-03-02 NOTE — Procedures (Signed)
Patient Name: Timothy Hubbard  MRN: [id number]  Epilepsy Attending: Lora Havens  Referring Physician/Provider: Amie Portland, MD  Date: 03/02/2022 Duration: 28.07 mins  Patient history: 31 year old man with new onset of multiple seizures. EEG to evaluate for seizure.  Level of alertness: lethargic   AEDs during EEG study: LEV  Technical aspects: This EEG study was done with scalp electrodes positioned according to the 10-20 International system of electrode placement. Electrical activity was acquired at a sampling rate of '500Hz'$  and reviewed with a high frequency filter of '70Hz'$  and a low frequency filter of '1Hz'$ . EEG data were recorded continuously and digitally stored.   Description: EEG showed continuous generalized 2 to 3 Hz delta slowing admixed with an excessive amount of 15 to 18 Hz beta activity distributed symmetrically and diffusely. Hyperventilation and photic stimulation were not performed.     ABNORMALITY - Continuous slow, generalized - Excessive beta, generalized  IMPRESSION: This study is suggestive of moderate to severe diffuse encephalopathy, nonspecific etiology but likely related to sedation. No seizures or epileptiform discharges were seen throughout the recording.  Ifeanyi Mickelson Barbra Sarks

## 2022-03-03 DIAGNOSIS — R569 Unspecified convulsions: Secondary | ICD-10-CM | POA: Diagnosis not present

## 2022-03-03 DIAGNOSIS — G9340 Encephalopathy, unspecified: Secondary | ICD-10-CM | POA: Diagnosis not present

## 2022-03-03 LAB — CBC
HCT: 25.2 % — ABNORMAL LOW (ref 39.0–52.0)
Hemoglobin: 7.3 g/dL — ABNORMAL LOW (ref 13.0–17.0)
MCH: 23 pg — ABNORMAL LOW (ref 26.0–34.0)
MCHC: 29 g/dL — ABNORMAL LOW (ref 30.0–36.0)
MCV: 79.2 fL — ABNORMAL LOW (ref 80.0–100.0)
Platelets: 213 10*3/uL (ref 150–400)
RBC: 3.18 MIL/uL — ABNORMAL LOW (ref 4.22–5.81)
RDW: 23.4 % — ABNORMAL HIGH (ref 11.5–15.5)
WBC: 5.9 10*3/uL (ref 4.0–10.5)
nRBC: 0 % (ref 0.0–0.2)

## 2022-03-03 LAB — IRON AND TIBC
Iron: 13 ug/dL — ABNORMAL LOW (ref 45–182)
Saturation Ratios: 4 % — ABNORMAL LOW (ref 17.9–39.5)
TIBC: 300 ug/dL (ref 250–450)
UIBC: 287 ug/dL

## 2022-03-03 LAB — PHOSPHORUS: Phosphorus: 3.1 mg/dL (ref 2.5–4.6)

## 2022-03-03 LAB — BASIC METABOLIC PANEL
Anion gap: 12 (ref 5–15)
BUN: 7 mg/dL (ref 6–20)
CO2: 23 mmol/L (ref 22–32)
Calcium: 8.4 mg/dL — ABNORMAL LOW (ref 8.9–10.3)
Chloride: 105 mmol/L (ref 98–111)
Creatinine, Ser: 0.7 mg/dL (ref 0.61–1.24)
GFR, Estimated: >60 mL/min (ref >60)
Glucose, Bld: 101 mg/dL — ABNORMAL HIGH (ref 70–99)
Potassium: 2.8 mmol/L — ABNORMAL LOW (ref 3.5–5.1)
Sodium: 140 mmol/L (ref 135–145)

## 2022-03-03 LAB — GLUCOSE, CAPILLARY
Glucose-Capillary: 103 mg/dL — ABNORMAL HIGH (ref 70–99)
Glucose-Capillary: 104 mg/dL — ABNORMAL HIGH (ref 70–99)
Glucose-Capillary: 105 mg/dL — ABNORMAL HIGH (ref 70–99)
Glucose-Capillary: 131 mg/dL — ABNORMAL HIGH (ref 70–99)
Glucose-Capillary: 158 mg/dL — ABNORMAL HIGH (ref 70–99)
Glucose-Capillary: 57 mg/dL — ABNORMAL LOW (ref 70–99)

## 2022-03-03 LAB — URINE CULTURE: Culture: NO GROWTH

## 2022-03-03 LAB — FERRITIN: Ferritin: 43 ng/mL (ref 24–336)

## 2022-03-03 LAB — MAGNESIUM: Magnesium: 1.9 mg/dL (ref 1.7–2.4)

## 2022-03-03 MED ORDER — DEXTROSE 50 % IV SOLN
INTRAVENOUS | Status: AC
  Administered 2022-03-03: 12.5 g via INTRAVENOUS
  Filled 2022-03-03: qty 50

## 2022-03-03 MED ORDER — DEXTROSE-NACL 5-0.9 % IV SOLN: INTRAVENOUS | Status: DC

## 2022-03-03 MED ORDER — DEXTROSE 10 % IV SOLN: INTRAVENOUS | Status: DC

## 2022-03-03 MED ORDER — ORAL CARE MOUTH RINSE
15.0000 mL | OROMUCOSAL | Status: DC | PRN
Start: 2022-03-03 — End: 2022-03-10

## 2022-03-03 MED ORDER — DEXTROSE 50 % IV SOLN: 12.5000 g | INTRAVENOUS | Status: AC

## 2022-03-03 MED ORDER — ORAL CARE MOUTH RINSE
15.0000 mL | OROMUCOSAL | Status: DC
  Administered 2022-03-03 – 2022-03-10 (×26): 15 mL via OROMUCOSAL

## 2022-03-03 MED ORDER — FENTANYL CITRATE (PF) 100 MCG/2ML IJ SOLN
12.5000 ug | INTRAMUSCULAR | Status: DC | PRN
  Administered 2022-03-03: 12.5 ug via INTRAVENOUS
  Filled 2022-03-03: qty 2

## 2022-03-03 MED ORDER — POTASSIUM CHLORIDE 10 MEQ/100ML IV SOLN
10.0000 meq | INTRAVENOUS | Status: AC
  Administered 2022-03-03 (×6): 10 meq via INTRAVENOUS
  Filled 2022-03-03 (×6): qty 100

## 2022-03-03 MED ORDER — FENTANYL CITRATE PF 50 MCG/ML IJ SOSY: 12.5000 ug | PREFILLED_SYRINGE | INTRAMUSCULAR | Status: DC | PRN

## 2022-03-03 MED ORDER — MAGNESIUM SULFATE 2 GM/50ML IV SOLN
2.0000 g | Freq: Once | INTRAVENOUS | Status: AC
  Administered 2022-03-03: 2 g via INTRAVENOUS
  Filled 2022-03-03: qty 50

## 2022-03-03 NOTE — Progress Notes (Signed)
[facility] Progress Note Patient Name: Timothy Hubbard DOB: Jan 01, 1991 MRN: [id number]   Date of Service  03/03/2022  HPI/Events of Note  Patient with hypoglycemia s/p D 50 % pushes without stabilization.  eICU Interventions  D 10 % water gtt ordered at 75 ml / hour.        [name] 03/03/2022, 1:57 AM

## 2022-03-03 NOTE — Evaluation (Signed)
Clinical/Bedside Swallow Evaluation Patient Details  Name: Timothy Hubbard MRN: [id number] Date of Birth: [birth year]  Today's Date: 03/03/2022 Time: SLP Start Time (ACUTE ONLY): 13 SLP Stop Time (ACUTE ONLY): 1300 SLP Time Calculation (min) (ACUTE ONLY): 20 min  Past Medical History:  Past Medical History:  Diagnosis Date   Asthma    HTN (hypertension)    Past Surgical History: No past surgical history on file. HPI:  Timothy Hubbard is a 31 yo man with a hx of HTN, asthma, GERD, ETOH use, here after multiple seizure like episodes and AMS.  Pt not intubated. Failed [place]. MRI negative.    Assessment / Plan / Recommendation  Clinical Impression  Pt demonstrates slow, poorly coordinated oral motor movement with speech and swallowing effort. Pt is attentive to PO and eager to sip, but cannot initiate immediate oral negative pressure with straw or cup edge. Pt with groping effort to sip and when he does allow water to flow into mouth he cannot coordinate effective labial seal and posterior transit, resulting in explosive anterior spillage and expected posterior spillage with coughng suggestive of aspiration. Pt is able to achieve slow oral manipulation of purees with mild left buccal residue. SLP demonstrated hand over hand feeding to mother and pointed out oral residue. OK for mother, who is a home health aide, to feed pt purees and puddings, but no liquids. Will f/u tomorrow for reassessment. SLP Visit Diagnosis: Dysphagia, oropharyngeal phase (R13.12)    Aspiration Risk  Moderate aspiration risk    Diet Recommendation Dysphagia 1 (Puree)   Liquid Administration via: Spoon Medication Administration: Crushed with puree Supervision: Full supervision/cueing for compensatory strategies Compensations: Slow rate;Small sips/bites;Lingual sweep for clearance of pocketing Postural Changes: Seated upright at 90 degrees    Other  Recommendations Oral Care Recommendations: Oral care BID     Recommendations for follow up therapy are one component of a multi-disciplinary discharge planning process, led by the attending physician.  Recommendations may be updated based on patient status, additional functional criteria and insurance authorization.  Follow up Recommendations Outpatient SLP      Assistance Recommended at Discharge    Functional Status Assessment    Frequency and Duration min 2x/week  2 weeks       Prognosis Prognosis for Safe Diet Advancement: Good Barriers to Reach Goals: Cognitive deficits      Swallow Study   General HPI: Timothy Hubbard is a 31 yo man with a hx of HTN, asthma, GERD, ETOH use, here after multiple seizure like episodes and AMS.  Pt not intubated. Failed [facility]. MRI negative. Type of Study: Bedside Swallow Evaluation Previous Swallow Assessment: none Diet Prior to this Study: NPO Temperature Spikes Noted: No Respiratory Status: Room air History of Recent Intubation: No Behavior/Cognition: Alert;Requires cueing;Distractible Self-Feeding Abilities: Total assist Patient Positioning: Postural control interferes with function Baseline Vocal Quality: Normal Volitional Cough: Strong Volitional Swallow: Able to elicit    Oral/Motor/Sensory Function Overall Oral Motor/Sensory Function: Within functional limits   Ice Chips     Thin Liquid Thin Liquid: Impaired Presentation: Cup;Straw Oral Phase Impairments: Reduced labial seal;Reduced lingual movement/coordination;Poor awareness of bolus Oral Phase Functional Implications: Right anterior spillage;Left anterior spillage;Prolonged oral transit;Oral residue Pharyngeal  Phase Impairments: Suspected delayed Swallow;Cough - Immediate    Nectar Thick Nectar Thick Liquid: Not tested   Honey Thick Honey Thick Liquid: Not tested   Puree Puree: Impaired Presentation: Spoon Oral Phase Impairments: Reduced lingual movement/coordination Oral Phase Functional Implications: Left lateral sulci  pocketing Pharyngeal Phase  Impairments: Suspected delayed Swallow   Solid     Solid: Not tested      Bernardino Dowell, Katherene Ponto 03/03/2022,1:25 PM

## 2022-03-03 NOTE — TOC Initial Note (Addendum)
Transition of Care [facility]) - Initial/Assessment Note    Patient Details  Name: Timothy Hubbard MRN: [id number] Date of Birth: 30-Nov-1990  [facility] CM/SW Contact:    Tom-Johnson, Renea Ee, RN Phone Number: 03/03/2022, 2:32 PM  Clinical Narrative:                 CM spoke with patient and mother, Timothy Hubbard at bedside about needs for post hospital transition. Admitted for Acute Encephalopathy. On room air. Has hx of substance abuse.  From home with his mother and sister. Not employed, currently on disability. Does not have any DME's at home. PCP is Alcus Dad, MD and uses [facility] on [address].  No PT/OT eval noted. MD notified to put in order if needed.  CM will continue to follow with needs.      Barriers to Discharge: Continued Medical Work up   Patient Goals and CMS Choice Patient states their goals for this hospitalization and ongoing recovery are:: To return home CMS Medicare.gov Compare Post Acute Care list provided to:: Patient Choice offered to / list presented to : Patient, Parent (Mother, Timothy Hubbard.)  Expected Discharge Plan and Services     Discharge Planning Services: CM Consult   Living arrangements for the past 2 months: Single Family Home                                      Prior Living Arrangements/Services Living arrangements for the past 2 months: Single Family Home Lives with:: Parents (Mother, Timothy Hubbard.) Patient language and need for interpreter reviewed:: Yes Do you feel safe going back to the place where you live?: Yes      Need for Family Participation in Patient Care: Yes (Comment) Care giver support system in place?: Yes (comment)   Criminal Activity/Legal Involvement Pertinent to Current Situation/Hospitalization: No - Comment as needed  Activities of Daily Living      Permission Sought/Granted Permission sought to share information with : Case Manager, Family Supports Permission granted  to share information with : Yes, Verbal Permission Granted              Emotional Assessment Appearance:: Appears stated age Attitude/Demeanor/Rapport: Engaged Affect (typically observed): Appropriate, Calm Orientation: : Oriented to Self Alcohol / Substance Use: Illicit Drugs Psych Involvement: No (comment)  Admission diagnosis:  Seizure ([place]) [R56.9] Encephalopathy acute [G93.40] Fever in adult [R50.9] Glasgow coma scale total score 9-12, at arrival to emergency department [R40.2422] Patient Active Problem List   Diagnosis Date Noted   Fever in adult    Seizure [facility])    Encephalopathy acute 03/01/2022   Alcohol use disorder 10/21/2016   Gastroesophageal reflux disease 06/13/2016   Essential hypertension 04/01/2016   Asthma in adult, moderate persistent, with acute exacerbation 04/01/2016   TOBACCO USER 06/13/2009   PCP:  Alcus Dad, MD Pharmacy:   [facility], [place] [address] Phone: [phone] Fax: [place], [place] [address] [place] [address] Phone: [phone] Fax: [phone]     Social Determinants of Health (SDOH) Interventions    Readmission Risk Interventions     No data to display

## 2022-03-03 NOTE — Progress Notes (Signed)
Bladder scan volume 258. Pt has not voided. Attempted In and Out cath, unsuccessful. Catheter unable to be passed. Will notify oncoming MD.

## 2022-03-03 NOTE — Progress Notes (Signed)
Subjective: More interactive this morning.  Had episodes of hypoglycemia overnight requiring IV dextrose  ROS: Unable to obtain due to poor mental status  Examination  Vital signs in last 24 hours: Temp:  [96.3 F (35.7 C)-98.9 F (37.2 C)] 98.4 F (36.9 C) (06/29 1210) Pulse Rate:  [71-200] 154 (06/29 1100) Resp:  [18-28] 19 (06/29 1100) BP: (98-155)/(70-124) 103/80 (06/29 1100) SpO2:  [87 %-100 %] 96 % (06/29 1100)  General: lying in bed, not in apparent distress Neuro: Opens eyes to repeated tactile stimuli, able to tell me his name and not appropriately when said if he is at the hospital, attempted to name objects but required significant effort and speech was dysarthric, able to consistently follow commands, PERRLA, EOMI, no facial asymmetry, spontaneously moving all 4 extremities  Basic Metabolic Panel: Recent Labs  Lab 03/01/22 2121 03/02/22 0640 03/03/22 0842  NA 141 138 140  K 3.9 3.1* 2.8*  CL 103 103 105  CO2 12* 19* 23  GLUCOSE 119* 126* 101*  BUN 10 5* 7  CREATININE 1.29* 0.83 0.70  CALCIUM 10.0 8.8* 8.4*  MG 2.0 1.7 1.9  PHOS  --   --  3.1    CBC: Recent Labs  Lab 03/01/22 2133 03/02/22 0640 03/03/22 0842  WBC 12.8* 9.0 5.9  NEUTROABS 9.5* 7.2  --   HGB 8.7* 8.0* 7.3*  HCT 30.9* 26.8* 25.2*  MCV 80.3 78.6* 79.2*  PLT 305 243 213     Coagulation Studies: Recent Labs    03/01/22 2133  LABPROT 12.1  INR 0.9    Imaging CT head and neck with and without contrast 03/03/2022: No large vessel occlusion, stenosis, vasospasm MRI brain with and without contrast 03/03/2022: No acute abnormality   ASSESSMENT AND PLAN: 31 year old male with history of substance use disorder who presented with new onset seizures and continues to be aphasic.   New onset seizures Cocaine use disorder Cannabis use disorder Acute encephalopathy, toxic-metabolic improving -Encephalopathy improving.  Likely due to prolonged postictal state, substance abuse, episodes of  hypoglycemia   Recommendations -Continue Keppra 500 mg twice daily -Substance abuse counseling once more awake -As needed IV Ativan for clinical seizures -Management of rest of comorbidities per primary team -Discussed plan with RN at bedside as well as critical care team via secure chat  I have spent a total of  35  minutes with the patient reviewing hospital notes,  test results, labs and examining the patient as well as establishing an assessment and plan that was discussed personally with the patient.  > 50% of time was spent in direct patient care.   Zeb Comfort Epilepsy [facility] For questions after 5pm please refer to AMION to reach the Neurologist on call

## 2022-03-03 NOTE — Progress Notes (Signed)
Patient with multiple episodes of hypoglycemia despite D 50 pushes. Started on D10 gtt. Current glucose 131.

## 2022-03-03 NOTE — Progress Notes (Signed)
NAME:  Timothy Hubbard, MRN:  [id number], DOB:  March 27, 1991, LOS: 2 ADMISSION DATE:  03/01/2022, CONSULTATION DATE:  03/01/22   REFERRING MD:  Doren Custard, CHIEF COMPLAINT:  seizure, AMS    History of Present Illness:  Timothy Hubbard is a 31 yo man with a hx of HTN, asthma, GERD, ETOH use, here after multiple seizure like episodes and AMS.   He was normal, then came out of the bathroom stumbling and fell, no head trauma. Then had teeth grinding and apparent seizure activity for 7 min.  Was altered, lethargic and confised afterwards.  Additional seizure about 1 hour later, and another 1 hr later.  Brought to the ED following the third episode.    Typically non compliant with medications intermittently.   Per history from his mother, he has been acting and feeling normal as far as she knows (he doesn't complain to her much and keeps his medical issues private from her).  Some R side pain recently.   No hx of etoh withdrawal.    Pertinent  Medical History  Etoh: drinks 3 beers per day, mother thinks he has cut down to 1 drink per day.  No hx of withdrawals  Marijuana use daily Hx of bipolar d/o, schizophrenia.    Significant Hospital Events: Including procedures, antibiotic start and stop dates in addition to other pertinent events   6/28 admitted to ICU with new onset sz activity, LTM, protecting his airway 6/29 no seizure on LTM, head imaging negative, still dysarthric but improving  Interim History / Subjective:   Hypoglycemic overnight, started on D10, speech improving and much more interactive Trouble with in and out cath overnight, but spontaneously voided  Objective   Blood pressure (!) 122/91, pulse (!) 200, temperature 97.9 F (36.6 C), temperature source Axillary, resp. rate (!) 26, height '5\' 4"'$  (1.626 m), weight 55 kg, SpO2 98 %.        Intake/Output Summary (Last 24 hours) at 03/03/2022 1053 Last data filed at 03/03/2022 0800 Gross per 24 hour  Intake 1062.19 ml  Output 550 ml  Net  512.19 ml    Filed Weights   03/01/22 2222 03/02/22 0131  Weight: 54.4 kg 55 kg    General:  ill-appearing M, resting in bed in no acute distress HEENT: MM pink/moist Neuro: awake, interactive, answering questions though speech is slurred, moving extremities to command CV: s1s2 rrr, no m/r/g PULM:  clear bilaterally without distress on RA GI: soft, non-distended Extremities: warm/dry, no edema  Skin: no rashes or lesions     Labs reviewed: K 2.8 Mag 1.9 Hgb 7.3    Resolved Hospital Problem list    [place]  Assessment & Plan:    New onset seizure activity Hx of ETOH use Acute Encephalopathy Low suspicion meningitis based on CSF results, procal low Possible ETOH withdrawal induced, per report had reduced intake recently Remained dysarthric yesterday, so CTA and MRI brain obtained which were unremarkable LTM without seizure activity -appreciate neurology recommendations, Keppra with seizure precautions and prn benzos -etiology not completely clear, hx of ETOH, UDS positive for cocaine and cannabinoids -mental status improving, though did not pass swallow screen  -TSH WNL -continue thiamine, folic acid and MV   Hypokalemia Hypomagnesemia K 2.8, Mag 1.9 -follow and replete prn   Microcytic Anemia  Hgb drifting down, 8.7->8.0->7.3 -possibly secondary to acute illness and phlebotomy -check iron and ferritin   HTN -continue to hold home medications while encephalopathic  GERD -continue protonix.   Asthma, tobacco  use -prn nebs, not currently wheezing  Best Practice (right click and "Reselect all SmartList Selections" daily)   Diet/type: NPO DVT prophylaxis: lovenox  GI prophylaxis: PPI Lines: N/A Foley:  N/A Code Status:  full code Last date of multidisciplinary goals of care discussion '[]'$   pending, family updated at the bedside 6/29  Labs   CBC: Recent Labs  Lab 03/01/22 2133 03/02/22 0640 03/03/22 0842  WBC 12.8* 9.0 5.9  NEUTROABS 9.5*  7.2  --   HGB 8.7* 8.0* 7.3*  HCT 30.9* 26.8* 25.2*  MCV 80.3 78.6* 79.2*  PLT 305 243 213     Basic Metabolic Panel: Recent Labs  Lab 03/01/22 2121 03/02/22 0640 03/03/22 0842  NA 141 138 140  K 3.9 3.1* 2.8*  CL 103 103 105  CO2 12* 19* 23  GLUCOSE 119* 126* 101*  BUN 10 5* 7  CREATININE 1.29* 0.83 0.70  CALCIUM 10.0 8.8* 8.4*  MG 2.0 1.7 1.9  PHOS  --   --  3.1    GFR: Estimated Creatinine Clearance: 103.1 mL/min (by C-G formula based on SCr of 0.7 mg/dL). Recent Labs  Lab 03/01/22 0038 03/01/22 2122 03/01/22 2133 03/02/22 0030 03/02/22 0640 03/03/22 0842  PROCALCITON  --   --   --  0.12  --   --   WBC  --   --  12.8*  --  9.0 5.9  LATICACIDVEN 7.5* >9.0*  --   --  3.2*  --      Liver Function Tests: Recent Labs  Lab 03/01/22 2121  AST 57*  ALT <5  ALKPHOS 64  BILITOT 1.3*  PROT 7.2  ALBUMIN 4.1    No results for input(s): "LIPASE", "AMYLASE" in the last 168 hours. Recent Labs  Lab 03/02/22 0640  AMMONIA 19     ABG    Component Value Date/Time   HCO3 15.7 (L) 03/01/2022 0038   ACIDBASEDEF 8.3 (H) 03/01/2022 0038   O2SAT 40.6 03/01/2022 0038     Coagulation Profile: Recent Labs  Lab 03/01/22 2133  INR 0.9     Cardiac Enzymes: No results for input(s): "CKTOTAL", "CKMB", "CKMBINDEX", "TROPONINI" in the last 168 hours.  HbA1C: No results found for: "HGBA1C"  CBG: Recent Labs  Lab 03/02/22 2347 03/03/22 0031 03/03/22 0308 03/03/22 0350 03/03/22 0717  GLUCAP 58* 158* 57* 131* 104*     Review of Systems:   Unable to assess  Past Medical History:  He,  has a past medical history of Asthma and HTN (hypertension).   Surgical History:  No past surgical history on file.   Social History:   reports that he has been smoking cigarettes. He started smoking about 14 years ago. He has a 4.00 pack-year smoking history. He has never used smokeless tobacco. He reports current alcohol use of about 2.0 standard drinks of alcohol  per week. He reports current drug use. Frequency: 7.00 times per week. Drug: Marijuana.   Family History:  His family history includes Cancer in his maternal grandfather; Hypertension in his mother.   Allergies Allergies  Allergen Reactions   Ibuprofen Anaphylaxis     Home Medications  Prior to Admission medications   Medication Sig Start Date End Date Taking? Authorizing Provider  albuterol (VENTOLIN HFA) 108 (90 Base) MCG/ACT inhaler Inhale 2 puffs into the lungs every 6 (six) hours as needed for wheezing or shortness of breath. 06/27/21   Alcus Dad, MD  amLODipine (NORVASC) 10 MG tablet TAKE 1 TABLET BY MOUTH DAILY AT  BEDTIME. You must be seen in the office prior to any additional refills. Please call for an appointment 01/06/22   Alcus Dad, MD  budesonide-formoterol [facility]) 80-4.5 MCG/ACT inhaler INHALE 2 PUFFS INTO THE LUNGS DAILY 06/19/21   Alcus Dad, MD  famotidine (PEPCID) 20 MG tablet TAKE 1 TABLET(20 MG) BY MOUTH TWICE DAILY 11/10/20   Alcus Dad, MD  methocarbamol (ROBAXIN) 500 MG tablet Take 1 tablet (500 mg total) by mouth 2 (two) times daily. 01/01/21   Hughie Closs, PA-C  nicotine (EQ NICOTINE) 14 mg/24hr patch Place 1 patch (14 mg total) onto the skin daily. 04/01/16   Mercy Riding, MD  omeprazole (PRILOSEC) 20 MG capsule TAKE 1 CAPSULE(20 MG) BY MOUTH DAILY AS NEEDED FOR ACID REFLUX. Patient needs to be seen in the office prior to receiving any additional refills. 07/23/21   Alcus Dad, MD     Critical care time: n/a      Otilio Carpen Hasaan Radde, PA-C [facility] See Amion for pager If no response to pager , please call [phone] until 7pm After 7:00 pm call Elink  [phone]

## 2022-03-04 DIAGNOSIS — E876 Hypokalemia: Secondary | ICD-10-CM

## 2022-03-04 DIAGNOSIS — R569 Unspecified convulsions: Secondary | ICD-10-CM | POA: Diagnosis not present

## 2022-03-04 DIAGNOSIS — D509 Iron deficiency anemia, unspecified: Secondary | ICD-10-CM

## 2022-03-04 HISTORY — DX: Hypokalemia: E87.6

## 2022-03-04 LAB — BLOOD CULTURE ID PANEL (REFLEXED) - BCID2

## 2022-03-04 LAB — GLUCOSE, CAPILLARY
Glucose-Capillary: 115 mg/dL — ABNORMAL HIGH (ref 70–99)
Glucose-Capillary: 145 mg/dL — ABNORMAL HIGH (ref 70–99)

## 2022-03-04 LAB — CBC
HCT: 22.7 % — ABNORMAL LOW (ref 39.0–52.0)
Hemoglobin: 6.5 g/dL — CL (ref 13.0–17.0)
MCH: 22.6 pg — ABNORMAL LOW (ref 26.0–34.0)
MCHC: 28.6 g/dL — ABNORMAL LOW (ref 30.0–36.0)
MCV: 79.1 fL — ABNORMAL LOW (ref 80.0–100.0)
Platelets: 179 10*3/uL (ref 150–400)
RBC: 2.87 MIL/uL — ABNORMAL LOW (ref 4.22–5.81)
RDW: 23.6 % — ABNORMAL HIGH (ref 11.5–15.5)
WBC: 4.7 10*3/uL (ref 4.0–10.5)
nRBC: 0 % (ref 0.0–0.2)

## 2022-03-04 LAB — HEMOGLOBIN A1C
Hgb A1c MFr Bld: <4.2 % — ABNORMAL LOW (ref 4.8–5.6)
Mean Plasma Glucose: <74 mg/dL

## 2022-03-04 LAB — BASIC METABOLIC PANEL
Anion gap: 7 (ref 5–15)
BUN: 5 mg/dL — ABNORMAL LOW (ref 6–20)
CO2: 22 mmol/L (ref 22–32)
Calcium: 8.1 mg/dL — ABNORMAL LOW (ref 8.9–10.3)
Chloride: 111 mmol/L (ref 98–111)
Creatinine, Ser: 0.67 mg/dL (ref 0.61–1.24)
GFR, Estimated: >60 mL/min (ref >60)
Glucose, Bld: 110 mg/dL — ABNORMAL HIGH (ref 70–99)
Potassium: 3.2 mmol/L — ABNORMAL LOW (ref 3.5–5.1)
Sodium: 140 mmol/L (ref 135–145)

## 2022-03-04 LAB — HSV 1/2 PCR, CSF
HSV-1 DNA: NEGATIVE
HSV-2 DNA: NEGATIVE

## 2022-03-04 LAB — HEMOGLOBIN AND HEMATOCRIT, BLOOD
HCT: 25.4 % — ABNORMAL LOW (ref 39.0–52.0)
Hemoglobin: 7.4 g/dL — ABNORMAL LOW (ref 13.0–17.0)

## 2022-03-04 LAB — VDRL, CSF: VDRL Quant, CSF: NONREACTIVE

## 2022-03-04 MED ORDER — FOOD THICKENER (SIMPLYTHICK)
10.0000 | ORAL | Status: DC | PRN
  Filled 2022-03-04: qty 10

## 2022-03-04 MED ORDER — ACETAMINOPHEN 325 MG PO TABS
650.0000 mg | ORAL_TABLET | Freq: Four times a day (QID) | ORAL | Status: DC | PRN
Start: 2022-03-04 — End: 2022-03-10
  Filled 2022-03-04: qty 2

## 2022-03-04 MED ORDER — ACETAMINOPHEN 160 MG/5ML PO SOLN
650.0000 mg | Freq: Four times a day (QID) | ORAL | Status: DC | PRN
  Filled 2022-03-04: qty 20.3

## 2022-03-04 NOTE — Evaluation (Signed)
Occupational Therapy Evaluation Patient Details Name: Timothy Hubbard MRN: [id number] DOB: 11-03-1990 Today's Date: 03/04/2022   History of Present Illness Mr. Widen is a 31 yo man with a hx of HTN, asthma, GERD, ETOH use, here after multiple seizure like episodes and AMS.    He was normal, then came out of the bathroom stumbling and fell, no head trauma. Then had teeth grinding and apparent seizure activity for 7 min.  Was altered, lethargic and confised afterwards.  Additional seizure about 1 hour later, and another 1 hr later.  Brought to the ED following the third episode   Clinical Impression   Pt admitted for concerns listed above. PTA pt reported that he was independent with all ADL's and IADL's, including working and driving. At this time, pt demonstrates weakness, balance deficits, cognitive deficits,decreased spatial awareness, and some ataxia. He is requiring min-max A +2 for all ADL's, especially in sitting and standing due to balance deficits and impulsivity. Recommending AIR at this time to maximize his independence and safety before returning home. OT will follow acutely.      Recommendations for follow up therapy are one component of a multi-disciplinary discharge planning process, led by the attending physician.  Recommendations may be updated based on patient status, additional functional criteria and insurance authorization.   Follow Up Recommendations  Acute inpatient rehab (3hours/day)    Assistance Recommended at Discharge Frequent or constant Supervision/Assistance  Patient can return home with the following A lot of help with walking and/or transfers;A lot of help with bathing/dressing/bathroom;Direct supervision/assist for medications management;Assistance with cooking/housework;Assistance with feeding;Direct supervision/assist for financial management;Assist for transportation;Help with stairs or ramp for entrance    Functional Status Assessment  Patient has had a  recent decline in their functional status and demonstrates the ability to make significant improvements in function in a reasonable and predictable amount of time.  Equipment Recommendations  Other (comment) (TBD)    Recommendations for Other Services Rehab consult     Precautions / Restrictions Precautions Precautions: Fall;Other (comment) Precaution Comments: seizure Restrictions Weight Bearing Restrictions: No      Mobility Bed Mobility Overal bed mobility: Needs Assistance Bed Mobility: Supine to Sit, Sit to Supine     Supine to sit: Min guard, HOB elevated, +2 for safety/equipment Sit to supine: Min guard, +2 for safety/equipment   General bed mobility comments: impulsive with mobility, poor safety awareness.    Transfers Overall transfer level: Needs assistance Equipment used: 2 person hand held assist Transfers: Sit to/from Stand, Bed to chair/wheelchair/BSC Sit to Stand: Min assist, +2 safety/equipment Stand pivot transfers: Min assist, +2 safety/equipment         General transfer comment: Pt very impulsive, able to complete transfer with min A, +2 helpful for safety as pt was not able to place feet flat on the floor and appears to have some decrease in spacial awareness      Balance Overall balance assessment: Needs assistance Sitting-balance support: Bilateral upper extremity supported, Feet supported Sitting balance-Leahy Scale: Poor Sitting balance - Comments: Pt unable to tolerate challange, has to have BUE, and min guard to min A to remain upright Postural control: Posterior lean Standing balance support: Bilateral upper extremity supported Standing balance-Leahy Scale: Poor Standing balance comment: Needs min assist and would benefit from BUE support                           ADL either performed or assessed with clinical judgement  ADL Overall ADL's : Needs assistance/impaired Eating/Feeding: Minimal assistance;Bed level   Grooming:  Minimal assistance;Bed level   Upper Body Bathing: Minimal assistance;Bed level   Lower Body Bathing: Maximal assistance;+2 for safety/equipment;+2 for physical assistance;Sit to/from stand;Sitting/lateral leans   Upper Body Dressing : Minimal assistance;Bed level   Lower Body Dressing: Maximal assistance;+2 for safety/equipment;+2 for physical assistance;Sit to/from stand;Sitting/lateral leans   Toilet Transfer: Minimal assistance;+2 for safety/equipment;Stand-pivot   Toileting- Clothing Manipulation and Hygiene: Maximal assistance;+2 for physical assistance;+2 for safety/equipment;Sit to/from stand         General ADL Comments: Pt is very impulsive and trying to do things on his own, however he is requiring increased assist to complete tasks.     Vision Baseline Vision/History: 0 No visual deficits Ability to See in Adequate Light: 0 Adequate Patient Visual Report: No change from baseline Vision Assessment?: No apparent visual deficits     Perception Perception Comments: Difficulty knowing where his body was "in space"   Praxis      Pertinent Vitals/Pain Pain Assessment Pain Assessment: No/denies pain     Hand Dominance Right   Extremity/Trunk Assessment Upper Extremity Assessment Upper Extremity Assessment: Generalized weakness (ataxic)   Lower Extremity Assessment Lower Extremity Assessment: Generalized weakness   Cervical / Trunk Assessment Cervical / Trunk Assessment: Normal   Communication Communication Communication: Expressive difficulties   Cognition Arousal/Alertness: Awake/alert Behavior During Therapy: Impulsive Overall Cognitive Status: Difficult to assess                                 General Comments: Decreased safety awareness and deficit awareness     General Comments  VSS on RA    Exercises     Shoulder Instructions      Home Living Family/patient expects to be discharged to:: Private residence Living Arrangements:  Parent Available Help at Discharge: Family;Available 24 hours/day Type of Home: House Home Access: Level entry     Home Layout: One level     Bathroom Shower/Tub: Tub only   Bathroom Toilet: Standard     Home Equipment: None          Prior Functioning/Environment Prior Level of Function : Independent/Modified Independent;Working/employed;Driving             Mobility Comments: fully independent prior to admission ADLs Comments: independent prior to admission        OT Problem List: Decreased strength;Decreased activity tolerance;Impaired balance (sitting and/or standing);Decreased coordination;Decreased cognition;Decreased safety awareness;Impaired sensation;Impaired UE functional use      OT Treatment/Interventions: Self-care/ADL training;Therapeutic exercise;Neuromuscular education;Energy conservation;DME and/or AE instruction;Therapeutic activities;Patient/family education;Balance training    OT Goals(Current goals can be found in the care plan section) Acute Rehab OT Goals Patient Stated Goal: To get his independence back OT Goal Formulation: With patient Time For Goal Achievement: 03/18/22 Potential to Achieve Goals: Good  OT Frequency: Min 2X/week    Co-evaluation PT/OT/SLP Co-Evaluation/Treatment: Yes Reason for Co-Treatment: For patient/therapist safety;To address functional/ADL transfers PT goals addressed during session: Mobility/safety with mobility;Balance OT goals addressed during session: ADL's and self-care;Strengthening/ROM      AM-PAC OT "6 Clicks" Daily Activity     Outcome Measure Help from another person eating meals?: A Little Help from another person taking care of personal grooming?: A Little Help from another person toileting, which includes using toliet, bedpan, or urinal?: A Lot Help from another person bathing (including washing, rinsing, drying)?: A Lot Help from another person to put  on and taking off regular upper body clothing?:  A Little Help from another person to put on and taking off regular lower body clothing?: A Lot 6 Click Score: 15   End of Session Equipment Utilized During Treatment: Gait belt Nurse Communication: Mobility status  Activity Tolerance: Patient tolerated treatment well Patient left: in bed;with call bell/phone within reach;with bed alarm set  OT Visit Diagnosis: Unsteadiness on feet (R26.81);Other abnormalities of gait and mobility (R26.89);Muscle weakness (generalized) (M62.81);Other symptoms and signs involving the nervous system (R29.898)                Time: 0881-1031 OT Time Calculation (min): 15 min Charges:  OT General Charges $OT Visit: 1 Visit OT Evaluation $OT Eval Moderate Complexity: 1 Mod  Sheng Pritz H., OTR/L Acute Rehabilitation  Kessler Kopinski Elane Yolanda Bonine 03/04/2022, 1:47 PM

## 2022-03-04 NOTE — Progress Notes (Signed)
Inpatient Rehab Admissions Coordinator Note:   Per therapy recommendations patient was screened for CIR candidacy by Michel Santee, PT. At this time, pt appears to be a potential candidate for CIR. I will place an order for rehab consult for full assessment, per our protocol. Please contact me any with questions.  Shann Medal, PT, DPT [phone] 03/04/22 2:52 PM

## 2022-03-04 NOTE — Progress Notes (Addendum)
Subjective: No acute events overnight.  Speech continues to improve.  Denies any concerns.  ROS: negative except above  Examination  Vital signs in last 24 hours: Temp:  [97.8 F (36.6 C)-98.4 F (36.9 C)] 98 F (36.7 C) (06/30 0747) Pulse Rate:  [81-181] 81 (06/30 0747) Resp:  [16-27] 18 (06/30 0747) BP: (88-114)/(64-94) 114/85 (06/30 0747) SpO2:  [86 %-100 %] 100 % (06/30 0747) Weight:  [61.3 kg] 61.3 kg (06/30 0524)  General: lying in bed, not in apparent distress Neuro: Awake, alert, oriented x 3, able to name objects, speech was dysarthric, able to consistently follow commands, PERRLA, EOMI, no facial asymmetry, spontaneously moving all 4 extremities  Basic Metabolic Panel: Recent Labs  Lab 03/01/22 2121 03/02/22 0640 03/03/22 0842  NA 141 138 140  K 3.9 3.1* 2.8*  CL 103 103 105  CO2 12* 19* 23  GLUCOSE 119* 126* 101*  BUN 10 5* 7  CREATININE 1.29* 0.83 0.70  CALCIUM 10.0 8.8* 8.4*  MG 2.0 1.7 1.9  PHOS  --   --  3.1    CBC: Recent Labs  Lab 03/01/22 2133 03/02/22 0640 03/03/22 0842  WBC 12.8* 9.0 5.9  NEUTROABS 9.5* 7.2  --   HGB 8.7* 8.0* 7.3*  HCT 30.9* 26.8* 25.2*  MCV 80.3 78.6* 79.2*  PLT 305 243 213     Coagulation Studies: Recent Labs    03/01/22 2133  LABPROT 12.1  INR 0.9    Imaging No new brain imaging overnight  ASSESSMENT AND PLAN: 31 year old male with history of substance use disorder who presented with new onset seizures and continues to be aphasic.   New onset seizures Cocaine use disorder Cannabis use disorder Acute encephalopathy, toxic-metabolic, resolved -Likely provoked seizure in setting of alcohol use, cocaine use.   Recommendations -Continue Keppra 500 mg twice daily.  Patient presented with convulsive status epilepticus.  Therefore recommend continuing this for now.  If patient remains seizure-free, this can potentially be weaned off in future.  -Substance abuse counseling, alcohol cessation counseling -Seizure  precautions including do not drive -Discussed factors that can lower seizure threshold including alcohol use, substance use, lack of sleep, medication noncompliance -As needed IV Ativan for clinical seizures -Management of rest of comorbidities per primary team -Follow-up with neurology 4 to 6 weeks  Seizure precautions: Per [facility] statutes, patients with seizures are not allowed to drive until they have been seizure-free for six months and cleared by a physician    Use caution when using heavy equipment or power tools. Avoid working on ladders or at heights. Take showers instead of baths. Ensure the water temperature is not too high on the home water heater. Do not go swimming alone. Do not lock yourself in a room alone (i.e. bathroom). When caring for infants or small children, sit down when holding, feeding, or changing them to minimize risk of injury to the child in the event you have a seizure. Maintain good sleep hygiene. Avoid alcohol.    If patient has another seizure, call 911 and bring them back to the ED if: A.  The seizure lasts longer than 5 minutes.      B.  The patient doesn't wake shortly after the seizure or has new problems such as difficulty seeing, speaking or moving following the seizure C.  The patient was injured during the seizure D.  The patient has a temperature over 102 F (39C) E.  The patient vomited during the seizure and now is having trouble  breathing    During the Seizure   - First, ensure adequate ventilation and place patients on the floor on their left side  Loosen clothing around the neck and ensure the airway is patent. If the patient is clenching the teeth, do not force the mouth open with any object as this can cause severe damage - Remove all items from the surrounding that can be hazardous. The patient may be oblivious to what's happening and may not even know what he or she is doing. If the patient is confused and wandering, either gently  guide him/her away and block access to outside areas - Reassure the individual and be comforting - Call 911. In most cases, the seizure ends before EMS arrives. However, there are cases when seizures may last over 3 to 5 minutes. Or the individual may have developed breathing difficulties or severe injuries. If a pregnant patient or a person with diabetes develops a seizure, it is prudent to call an ambulance. - Finally, if the patient does not regain full consciousness, then call EMS. Most patients will remain confused for about 45 to 90 minutes after a seizure, so you must use judgment in calling for help.    After the Seizure (Postictal Stage)   After a seizure, most patients experience confusion, fatigue, muscle pain and/or a headache. Thus, one should permit the individual to sleep. For the next few days, reassurance is essential. Being calm and helping reorient the person is also of importance.   Most seizures are painless and end spontaneously. Seizures are not harmful to others but can lead to complications such as stress on the lungs, brain and the heart. Individuals with prior lung problems may develop labored breathing and respiratory distress.    I have spent a total of 38 minutes with the patient reviewing hospital notes,  test results, labs and examining the patient as well as establishing an assessment and plan that was discussed personally with the patient.  > 50% of time was spent in direct patient care.   Zeb Comfort Epilepsy [facility] For questions after 5pm please refer to AMION to reach the Neurologist on call

## 2022-03-04 NOTE — Evaluation (Signed)
Physical Therapy Evaluation Patient Details Name: Timothy Hubbard MRN: [id number] DOB: 1991/01/24 Today's Date: 03/04/2022  History of Present Illness  [name] is a 31 yo man with a hx of HTN, asthma, GERD, ETOH use, here after multiple seizure like episodes and AMS.    He was normal, then came out of the bathroom stumbling and fell, no head trauma. Then had teeth grinding and apparent seizure activity for 7 min.  Was altered, lethargic and confised afterwards.  Additional seizure about 1 hour later, and another 1 hr later.  Brought to the ED following the third episode   Clinical Impression  Patient received in bed, agreeable to PT/OT assessment. Patient has expressive difficulties, but appears to understand directions well. He is impulsive with mobility and at increased fall risk.  Patient requires min A +2 for bed mobility due to lines, weakness, poor safety. Poor sitting balance requiring min guard to min A. He is able to stand with +2 min/mod A and transferred to [facility]. He is unsteady and unaware of deficits. Patient will continue to benefit from skilled PT to improve safety, strength and functional independence to return home with mother.      Recommendations for follow up therapy are one component of a multi-disciplinary discharge planning process, led by the attending physician.  Recommendations may be updated based on patient status, additional functional criteria and insurance authorization.  Follow Up Recommendations Acute inpatient rehab (3hours/day)      Assistance Recommended at Discharge Frequent or constant Supervision/Assistance  Patient can return home with the following  A lot of help with walking and/or transfers;A lot of help with bathing/dressing/bathroom;Assist for transportation;Help with stairs or ramp for entrance;Assistance with cooking/housework;Direct supervision/assist for medications management    Equipment Recommendations Rolling walker (2 wheels)   Recommendations for Other Services       Functional Status Assessment Patient has had a recent decline in their functional status and demonstrates the ability to make significant improvements in function in a reasonable and predictable amount of time.     Precautions / Restrictions Precautions Precautions: Fall;Other (comment) Precaution Comments: seizure Restrictions Weight Bearing Restrictions: No      Mobility  Bed Mobility Overal bed mobility: Needs Assistance Bed Mobility: Supine to Sit, Sit to Supine     Supine to sit: Min guard, HOB elevated, +2 for safety/equipment Sit to supine: Min guard, +2 for safety/equipment   General bed mobility comments: impulsive with mobility, poor safety awareness.    Transfers Overall transfer level: Needs assistance Equipment used: 2 person hand held assist, None Transfers: Sit to/from Stand, Bed to chair/wheelchair/BSC Sit to Stand: Mod assist, +2 physical assistance, +2 safety/equipment Stand pivot transfers: Mod assist, +2 physical assistance, +2 safety/equipment         General transfer comment: patient unsteady with mobility and standing    Ambulation/Gait               General Gait Details: unable to attempt today due to poor safety awareness/poor balance  Stairs            Wheelchair Mobility    Modified Rankin (Stroke Patients Only)       Balance Overall balance assessment: Needs assistance Sitting-balance support: Feet supported Sitting balance-Leahy Scale: Poor Sitting balance - Comments: poor trunk control in sitting Postural control: Posterior lean Standing balance support: No upper extremity supported Standing balance-Leahy Scale: Poor Standing balance comment: needs min/mod assist to stand with posterior leaning.  Pertinent Vitals/Pain Pain Assessment Pain Assessment: No/denies pain    Home Living Family/patient expects to be discharged to::  Private residence Living Arrangements: Parent Available Help at Discharge: Family;Available 24 hours/day Type of Home: House Home Access: Level entry       Home Layout: One level Home Equipment: None      Prior Function Prior Level of Function : Independent/Modified Independent;Working/employed;Driving             Mobility Comments: fully independent prior to admission ADLs Comments: independent prior to admission     Hand Dominance   Dominant Hand: Right    Extremity/Trunk Assessment   Upper Extremity Assessment Upper Extremity Assessment: Generalized weakness    Lower Extremity Assessment Lower Extremity Assessment: Generalized weakness    Cervical / Trunk Assessment Cervical / Trunk Assessment: Normal  Communication   Communication: Expressive difficulties  Cognition Arousal/Alertness: Awake/alert Behavior During Therapy: Impulsive Overall Cognitive Status: Difficult to assess                                          General Comments General comments (skin integrity, edema, etc.): VSS onRA    Exercises     Assessment/Plan    PT Assessment Patient needs continued PT services  PT Problem List Decreased strength;Decreased mobility;Decreased activity tolerance;Decreased balance;Decreased safety awareness;Decreased knowledge of use of DME;Decreased cognition;Decreased coordination       PT Treatment Interventions DME instruction;Therapeutic exercise;Gait training;Balance training;Stair training;Neuromuscular re-education;Functional mobility training;Therapeutic activities;Patient/family education;Cognitive remediation    PT Goals (Current goals can be found in the Care Plan section)  Acute Rehab PT Goals Patient Stated Goal: none stated PT Goal Formulation: With patient Time For Goal Achievement: 03/18/22 Potential to Achieve Goals: Good    Frequency Min 3X/week     Co-evaluation PT/OT/SLP Co-Evaluation/Treatment: Yes Reason  for Co-Treatment: For patient/therapist safety;To address functional/ADL transfers PT goals addressed during session: Mobility/safety with mobility;Balance OT goals addressed during session: ADL's and self-care;Strengthening/ROM       AM-PAC PT "6 Clicks" Mobility  Outcome Measure Help needed turning from your back to your side while in a flat bed without using bedrails?: A Little Help needed moving from lying on your back to sitting on the side of a flat bed without using bedrails?: A Little Help needed moving to and from a bed to a chair (including a wheelchair)?: A Lot Help needed standing up from a chair using your arms (e.g., wheelchair or bedside chair)?: A Lot Help needed to walk in hospital room?: Total Help needed climbing 3-5 steps with a railing? : Total 6 Click Score: 12    End of Session Equipment Utilized During Treatment: Gait belt Activity Tolerance: Patient tolerated treatment well Patient left: in bed;with call bell/phone within reach;with bed alarm set Nurse Communication: Mobility status PT Visit Diagnosis: Unsteadiness on feet (R26.81);Other abnormalities of gait and mobility (R26.89);Muscle weakness (generalized) (M62.81);Difficulty in walking, not elsewhere classified (R26.2)    Time: 6073-7106 PT Time Calculation (min) (ACUTE ONLY): 17 min   Charges:   PT Evaluation $PT Eval Moderate Complexity: 1 Mod          Merick Kelleher, PT, GCS 03/04/22,1:46 PM

## 2022-03-04 NOTE — Progress Notes (Addendum)
PROGRESS NOTE    Timothy Hubbard  YBO:[id number] DOB: 10/07/1990 DOA: 03/01/2022 PCP: Alcus Dad, MD   Brief Narrative:  [name] is a 31 yo man with a hx of HTN, asthma, GERD, ETOH use, here after multiple seizure like episodes and AMS.  He was previously noted to be normal per family, then came out of the bathroom stumbling and fell, without notable trauma. Then had teeth grinding and apparent seizure activity for 7 min.  Was altered, lethargic and confised afterwards. Additional seizure about 1 hour later, and another 1 hr later.  Brought to the ED following the third episode.    Typically non compliant with medications intermittently at baseline - no current active home medications listed on med reconciliation. Per his mother, he has been acting and feeling normal as far as she knows (he doesn't complain to her much and keeps his medical issues private from her).  Some R side pain recently. No hx of etoh withdrawal.    6/28 admitted to ICU with new onset sz activity, LTM, protecting his airway 6/29 no seizure on LTM, head imaging negative, still dysarthric but improving 6/30 Stable transfer to medical floor - requesting IV narcotics (previously on in ICU) which we discussed were not going to be replaced for his chronic lumbago. Will continue OTC medications PRN.  Assessment & Plan:   Principal Problem:   Encephalopathy acute Active Problems:   Fever in adult   Seizure [facility])   New onset seizure activity, ? Provoked secondary to etoh decrease Without signs/symptoms of withdrawals. Chronic ETOH use /abuse Acute Encephalopathy -Etoh intake cutdown recently (unclear if drastic cut or cessation) -Low suspicion meningitis based on CSF results, procal low -CTA and MRI brain unremarkable - Neuro following - continue keppra - EEG unremarkable - UDS positive for cocaine and cannabinoids -Mental status slowly improving, though did not pass swallow screen  -TSH WNL -continue thiamine,  folic acid and MV supplementation    Dysphagia -Unclear etiology -Possibly secondary to acute siezure, follows with speech  Hypokalemia Hypomagnesemia In the setting of chronic alcohol abuse/malnutrition K 2.8, Mag 1.9 -Replete as appropriate   Iron deficiency anemia  Hgb drifting down, 8.7->8.0->7.3 Fe stores low Transient hgb low with am labs but rechecked and at prior levels, follow clinically  No signs/symptoms of bleeding currently   HTN -continue to hold home medications while encephalopathic -Currently well controlled   GERD -continue protonix.    Asthma, tobacco use -prn nebs, not currently wheezing    DVT prophylaxis: Lovenox Code Status: Full Family Communication: None present - mother not available by phone  Status is: Inpt  Dispo: The patient is from: Home              Anticipated d/c is to: Home              Anticipated d/c date is: 24-48h              Patient currently NOT medically stable for discharge  Consultants:  PCCM, Neuro  Procedures:  None  Antimicrobials:  None   Subjective: No acute issues/events overnight  Objective: Vitals:   03/03/22 2300 03/04/22 0300 03/04/22 0524 03/04/22 0747  BP: 110/84 114/84  114/85  Pulse: 94 82  81  Resp: '20 18  18  '$ Temp: 98.4 F (36.9 C) 98.2 F (36.8 C)  98 F (36.7 C)  TempSrc: Oral Oral    SpO2: 98% 97%  100%  Weight:   61.3 kg   Height:  Intake/Output Summary (Last 24 hours) at 03/04/2022 0805 Last data filed at 03/04/2022 0500 Gross per 24 hour  Intake 674.35 ml  Output 825 ml  Net -150.65 ml   Filed Weights   03/01/22 2222 03/02/22 0131 03/04/22 0524  Weight: 54.4 kg 55 kg 61.3 kg    Examination:  General exam: Appears calm and comfortable  Respiratory system: Clear to auscultation. Respiratory effort normal. Cardiovascular system: S1 & S2 heard, RRR. No JVD, murmurs, rubs, gallops or clicks. No pedal edema. Gastrointestinal system: Abdomen is nondistended, soft and  nontender. No organomegaly or masses felt. Normal bowel sounds heard. Central nervous system: Alert and oriented. No focal neurological deficits. Extremities: Symmetric 5 x 5 power. Skin: No rashes, lesions or ulcers Psychiatry: Poor judgement and insight, mood & affect labile   Data Reviewed: I have personally reviewed following labs and imaging studies  CBC: Recent Labs  Lab 03/01/22 2133 03/02/22 0640 03/03/22 0842  WBC 12.8* 9.0 5.9  NEUTROABS 9.5* 7.2  --   HGB 8.7* 8.0* 7.3*  HCT 30.9* 26.8* 25.2*  MCV 80.3 78.6* 79.2*  PLT 305 243 528   Basic Metabolic Panel: Recent Labs  Lab 03/01/22 2121 03/02/22 0640 03/03/22 0842  NA 141 138 140  K 3.9 3.1* 2.8*  CL 103 103 105  CO2 12* 19* 23  GLUCOSE 119* 126* 101*  BUN 10 5* 7  CREATININE 1.29* 0.83 0.70  CALCIUM 10.0 8.8* 8.4*  MG 2.0 1.7 1.9  PHOS  --   --  3.1   GFR: Estimated Creatinine Clearance: 111 mL/min (by C-G formula based on SCr of 0.7 mg/dL). Liver Function Tests: Recent Labs  Lab 03/01/22 2121  AST 57*  ALT <5  ALKPHOS 64  BILITOT 1.3*  PROT 7.2  ALBUMIN 4.1   No results for input(s): "LIPASE", "AMYLASE" in the last 168 hours. Recent Labs  Lab 03/02/22 0640  AMMONIA 19   Coagulation Profile: Recent Labs  Lab 03/01/22 2133  INR 0.9   Cardiac Enzymes: No results for input(s): "CKTOTAL", "CKMB", "CKMBINDEX", "TROPONINI" in the last 168 hours. BNP (last 3 results) No results for input(s): "PROBNP" in the last 8760 hours. HbA1C: No results for input(s): "HGBA1C" in the last 72 hours. CBG: Recent Labs  Lab 03/03/22 0308 03/03/22 0350 03/03/22 0717 03/03/22 1209 03/03/22 1547  GLUCAP 57* 131* 104* 103* 105*   Lipid Profile: No results for input(s): "CHOL", "HDL", "LDLCALC", "TRIG", "CHOLHDL", "LDLDIRECT" in the last 72 hours. Thyroid Function Tests: Recent Labs    03/02/22 0640  TSH 1.889   Anemia Panel: Recent Labs    03/03/22 0842  FERRITIN 43  TIBC 300  IRON 13*    Sepsis Labs: Recent Labs  Lab 03/01/22 0038 03/01/22 2122 03/02/22 0030 03/02/22 0640  PROCALCITON  --   --  0.12  --   LATICACIDVEN 7.5* >9.0*  --  3.2*    Recent Results (from the past 240 hour(s))  Resp Panel by RT-PCR (Flu A&B, Covid) Anterior Nasal Swab     Status: None   Collection Time: 03/01/22 12:48 AM   Specimen: Anterior Nasal Swab  Result Value Ref Range Status   SARS Coronavirus 2 by RT PCR NEGATIVE NEGATIVE Final    Comment: (NOTE) SARS-CoV-2 target nucleic acids are NOT DETECTED.  The SARS-CoV-2 RNA is generally detectable in upper respiratory specimens during the acute phase of infection. The lowest concentration of SARS-CoV-2 viral copies this assay can detect is 138 copies/mL. A negative result does not preclude  SARS-Cov-2 infection and should not be used as the sole basis for treatment or other patient management decisions. A negative result may occur with  improper specimen collection/handling, submission of specimen other than nasopharyngeal swab, presence of viral mutation(s) within the areas targeted by this assay, and inadequate number of viral copies(<138 copies/mL). A negative result must be combined with clinical observations, patient history, and epidemiological information. The expected result is Negative.  Fact Sheet for Patients:  EntrepreneurPulse.com.au  Fact Sheet for Healthcare Providers:  IncredibleEmployment.be  This test is no t yet approved or cleared by the [place] FDA and  has been authorized for detection and/or diagnosis of SARS-CoV-2 by FDA under an Emergency Use Authorization (EUA). This EUA will remain  in effect (meaning this test can be used) for the duration of the COVID-19 declaration under Section 564(b)(1) of the Act, 21 U.S.C.section 360bbb-3(b)(1), unless the authorization is terminated  or revoked sooner.       Influenza A by PCR NEGATIVE NEGATIVE Final   Influenza B  by PCR NEGATIVE NEGATIVE Final    Comment: (NOTE) The Xpert Xpress SARS-CoV-2/FLU/RSV plus assay is intended as an aid in the diagnosis of influenza from Nasopharyngeal swab specimens and should not be used as a sole basis for treatment. Nasal washings and aspirates are unacceptable for Xpert Xpress SARS-CoV-2/FLU/RSV testing.  Fact Sheet for Patients: EntrepreneurPulse.com.au  Fact Sheet for Healthcare Providers: IncredibleEmployment.be  This test is not yet approved or cleared by the [place] FDA and has been authorized for detection and/or diagnosis of SARS-CoV-2 by FDA under an Emergency Use Authorization (EUA). This EUA will remain in effect (meaning this test can be used) for the duration of the COVID-19 declaration under Section 564(b)(1) of the Act, 21 U.S.C. section 360bbb-3(b)(1), unless the authorization is terminated or revoked.  Performed at [facility], [place] [address] [place], [place] [address]   Blood Culture (routine x 2)     Status: None (Preliminary result)   Collection Time: 03/01/22  9:54 PM   Specimen: BLOOD  Result Value Ref Range Status   Specimen Description   Final    BLOOD RIGHT ANTECUBITAL Performed at [facility] [address] [place], [place] [address]    Special Requests   Final    BOTTLES DRAWN AEROBIC AND ANAEROBIC Blood Culture adequate volume Performed at [facility] [address] [place], [place] [address]    Culture   Final    NO GROWTH 1 DAY Performed at [facility], [place] [address], [place], [place] [address]    Report Status PENDING  Incomplete  Blood Culture (routine x 2)     Status: None (Preliminary result)   Collection Time: 03/01/22  9:57 PM   Specimen: BLOOD  Result Value Ref Range Status   Specimen Description   Final    BLOOD LEFT ANTECUBITAL Performed at [facility] [address],  [place], [place] [address]    Special Requests   Final    BOTTLES DRAWN AEROBIC AND ANAEROBIC Blood Culture adequate volume Performed at [facility] [address], [place], [place] [address]    Culture   Final    NO GROWTH 1 DAY Performed at [facility], [place] [address], [place], [place] [address]    Report Status PENDING  Incomplete  CSF culture     Status: None (Preliminary result)   Collection Time: 03/01/22 11:16 PM   Specimen: Back;  Cerebrospinal Fluid  Result Value Ref Range Status   Specimen Description   Final    BACK Performed at [facility] [address], [place], [place] [address]    Special Requests   Final    NONE Performed at [facility], [place] [address] [place], [place] [address]    Gram Stain   Final    NO WBC SEEN NO ORGANISMS SEEN CYTOSPIN SMEAR Gram Stain Report Called to,Read Back By and Verified With: CALLED TO KELSEY,A ON [id number] AT [place]    Culture   Final    NO GROWTH 1 DAY Performed at [facility], [place] [address] [place], [place] [address]    Report Status PENDING  Incomplete  Urine Culture     Status: None   Collection Time: 03/02/22  1:24 AM   Specimen: In/Out Cath Urine  Result Value Ref Range Status   Specimen Description IN/OUT CATH URINE  Final   Special Requests NONE  Final   Culture   Final    NO GROWTH Performed at [facility], [place] [address], [place], [place] [address]    Report Status 03/03/2022 FINAL  Final  MRSA Next Gen by PCR, Nasal     Status: None   Collection Time: 03/02/22  1:33 AM   Specimen: Nasal Mucosa; Nasal Swab  Result Value Ref Range Status   MRSA by PCR Next Gen NOT DETECTED NOT DETECTED Final    Comment: (NOTE) The GeneXpert MRSA Assay (FDA approved for NASAL specimens only), is one component of a comprehensive MRSA colonization surveillance program. It is not intended to diagnose MRSA infection nor to guide or monitor  treatment for MRSA infections. Test performance is not FDA approved in patients less than 72 years old. Performed at [facility], [place] [address], [place], [place] [address]          Radiology Studies: CT ANGIO HEAD NECK W WO CM  Result Date: 03/02/2022 CLINICAL DATA:  Vaso spasm suspected EXAM: CT ANGIOGRAPHY HEAD AND NECK TECHNIQUE: Multidetector CT imaging of the head and neck was performed using the standard protocol during bolus administration of intravenous contrast. Multiplanar CT image reconstructions and MIPs were obtained to evaluate the vascular anatomy. Carotid stenosis measurements (when applicable) are obtained utilizing NASCET criteria, using the distal internal carotid diameter as the denominator. RADIATION DOSE REDUCTION: This exam was performed according to the departmental dose-optimization program which includes automated exposure control, adjustment of the mA and/or kV according to patient size and/or use of iterative reconstruction technique. CONTRAST:  181m OMNIPAQUE IOHEXOL 300 MG/ML  SOLN COMPARISON:  Brain MRI obtained earlier the same day, noncontrast CT head dated 1 day prior FINDINGS: CT HEAD FINDINGS Brain: There is no acute intracranial hemorrhage, extra-axial fluid collection, or acute infarct. The ventricles are stable in size. There is no mass lesion. There is no mass effect or midline shift. Vascular: See below. Skull: Normal. Negative for fracture or focal lesion. Sinuses: The paranasal sinuses are clear. Orbits: The globes and orbits are unremarkable. Review of the MIP images confirms the above findings CTA NECK FINDINGS Aortic arch: The imaged aortic arch is normal. The origins of the major branch vessels are patent. The subclavian arteries are patent to the level imaged. Right carotid system: The right common, internal, and external carotid arteries are patent, without hemodynamically significant stenosis or occlusion. There is no dissection or aneurysm.  Left carotid system: The left common, internal,  and external carotid arteries are patent, without hemodynamically significant stenosis or occlusion. There is minimal plaque in the proximal left internal carotid artery. There is no dissection or aneurysm. Vertebral arteries: Vertebral arteries are patent, without hemodynamically significant stenosis or occlusion. There is no dissection or aneurysm. Skeleton: There is no acute osseous abnormality or suspicious osseous lesion. There is no visible canal hematoma. Other neck: The soft tissues of the neck are unremarkable. Upper chest: The imaged lung apices are clear. Review of the MIP images confirms the above findings CTA HEAD FINDINGS Anterior circulation: The intracranial ICAs are patent. The bilateral MCAs are patent and normal in appearance. The bilateral ACAs are patent and normal in appearance. The anterior communicating artery is normal. There is no evidence of vasospasm.  There is no aneurysm or AVM. Posterior circulation: The bilateral V4 segments are patent. The basilar artery is patent. The bilateral PCAs are patent and normal in appearance. There is no evidence of vasospasm. Small bilateral posterior communicating arteries are identified. There is no aneurysm or AVM. Venous sinuses: As permitted by contrast timing, patent. Anatomic variants: None. Review of the MIP images confirms the above findings IMPRESSION: 1. No acute intracranial pathology. 2. Normal CTA of the head and neck. Electronically Signed   By: Valetta Mole M.D.   On: 03/02/2022 15:31   MR BRAIN W WO CONTRAST  Result Date: 03/02/2022 CLINICAL DATA:  New onset seizure EXAM: MRI HEAD WITHOUT AND WITH CONTRAST TECHNIQUE: Multiplanar, multiecho pulse sequences of the brain and surrounding structures were obtained without and with intravenous contrast. CONTRAST:  67m GADAVIST GADOBUTROL 1 MMOL/ML IV SOLN COMPARISON:  CT head dated 1 day prior FINDINGS: Brain: There is no acute intracranial  hemorrhage, extra-axial fluid collection, or acute infarct. There is mild global parenchymal volume loss which appears accelerated for age. The ventricles are not enlarged. Gray-white differentiation is preserved. Parenchymal signal is normal. There is no structural or migration abnormality. The hippocampi are normal in signal and architecture There is no mass lesion. There is no abnormal enhancement. There is no mass effect or midline shift Vascular: Normal flow voids. Skull and upper cervical spine: Normal marrow signal. Sinuses/Orbits: The paranasal sinuses are clear. The globes and orbits are unremarkable. Other: None. IMPRESSION: 1. No acute intracranial pathology or epileptogenic focus identified. 2. Mild global parenchymal volume loss, accelerated for age. Electronically Signed   By: PValetta MoleM.D.   On: 03/02/2022 14:33   Overnight EEG with video  Result Date: 03/02/2022 YLora Havens MD     03/02/2022 11:54 AM MRN: [id number] Attending: PLora HavensReferring Physician/Provider: AAmie Portland MD Duration: 03/02/2022 0321 to 148 Patient history: 31year old man with new onset of multiple seizures. EEG to evaluate for seizure.  Level of alertness: lethargic  AEDs during EEG study: LEV  Technical aspects: This EEG study was done with scalp electrodes positioned according to the 10-20 International system of electrode placement. Electrical activity was acquired at a sampling rate of '500Hz'$  and reviewed with a high frequency filter of '70Hz'$  and a low frequency filter of '1Hz'$ . EEG data were recorded continuously and digitally stored.  Description: EEG showed continuous generalized 2 to 3 Hz delta slowing admixed with an excessive amount of 15 to 18 Hz beta activity distributed symmetrically and diffusely. Hyperventilation and photic stimulation were not performed.    ABNORMALITY - Continuous slow, generalized - Excessive beta, generalized  IMPRESSION: This study is suggestive of moderate to  severe diffuse encephalopathy, nonspecific etiology  but likely related to sedation. No seizures or epileptiform discharges were seen throughout the recording.  Lora Havens   EEG adult  Result Date: 03/02/2022 Lora Havens, MD     03/02/2022  8:15 AM Patient Name: GWYNN CROSSLEY MRN: [id number] Epilepsy Attending: Lora Havens Referring Physician/Provider: Amie Portland, MD Date: 03/02/2022 Duration: 28.07 mins Patient history: 31 year old man with new onset of multiple seizures. EEG to evaluate for seizure. Level of alertness: lethargic AEDs during EEG study: LEV Technical aspects: This EEG study was done with scalp electrodes positioned according to the 10-20 International system of electrode placement. Electrical activity was acquired at a sampling rate of '500Hz'$  and reviewed with a high frequency filter of '70Hz'$  and a low frequency filter of '1Hz'$ . EEG data were recorded continuously and digitally stored. Description: EEG showed continuous generalized 2 to 3 Hz delta slowing admixed with an excessive amount of 15 to 18 Hz beta activity distributed symmetrically and diffusely. Hyperventilation and photic stimulation were not performed.   ABNORMALITY - Continuous slow, generalized - Excessive beta, generalized IMPRESSION: This study is suggestive of moderate to severe diffuse encephalopathy, nonspecific etiology but likely related to sedation. No seizures or epileptiform discharges were seen throughout the recording. Priyanka Barbra Sarks    Scheduled Meds:  Chlorhexidine Gluconate Cloth  6 each Topical Q0600   enoxaparin (LOVENOX) injection  40 mg Subcutaneous QHS   folic acid  1 mg Intravenous Daily   mometasone-formoterol  2 puff Inhalation BID   multivitamin with minerals  1 tablet Oral Daily   mouth rinse  15 mL Mouth Rinse 4 times per day   mouth rinse  15 mL Mouth Rinse 4 times per day   pantoprazole (PROTONIX) IV  40 mg Intravenous Q24H   thiamine injection  100 mg Intravenous Daily    Continuous Infusions:  dextrose 5 % and 0.9% NaCl 75 mL/hr at 03/04/22 0112   lactated ringers     lactated ringers     levETIRAcetam 500 mg (03/03/22 2027)     LOS: 3 days   Time spent: 76mn  Haron Beilke C Maurica Omura, DO [facility]  If 7PM-7AM, please contact night-coverage [email or web address]  03/04/2022, 8:05 AM

## 2022-03-04 NOTE — Progress Notes (Signed)
PHARMACY - PHYSICIAN COMMUNICATION CRITICAL VALUE ALERT - BLOOD CULTURE IDENTIFICATION (BCID)  Timothy Hubbard is an 31 y.o. male who presented to [facility] on 03/01/2022 with a chief complaint of seizure/AMS  Assessment:  65 YOM with hx PSA admitted with AMS/seizures now 1 of 4 bottles growing GPC with BCID detecting staph species - appears to be contamination since only 1 bottle and fevers/leukocytosis resolved but should monitor to ensure other bottles don't turn positive given history of PSA.  Name of physician (or Provider) Contacted: Avon Gully  Current antibiotics: None  Changes to prescribed antibiotics recommended:  None - presumably contamination.  Results for orders placed or performed during the hospital encounter of 03/01/22  Blood Culture ID Panel (Reflexed) (Collected: 03/01/2022  9:57 PM)  Result Value Ref Range   Enterococcus faecalis NOT DETECTED NOT DETECTED   Enterococcus Faecium NOT DETECTED NOT DETECTED   Listeria monocytogenes NOT DETECTED NOT DETECTED   Staphylococcus species DETECTED (A) NOT DETECTED   Staphylococcus aureus (BCID) NOT DETECTED NOT DETECTED   Staphylococcus epidermidis NOT DETECTED NOT DETECTED   Staphylococcus lugdunensis NOT DETECTED NOT DETECTED   Streptococcus species NOT DETECTED NOT DETECTED   Streptococcus agalactiae NOT DETECTED NOT DETECTED   Streptococcus pneumoniae NOT DETECTED NOT DETECTED   Streptococcus pyogenes NOT DETECTED NOT DETECTED   A.calcoaceticus-baumannii NOT DETECTED NOT DETECTED   Bacteroides fragilis NOT DETECTED NOT DETECTED   Enterobacterales NOT DETECTED NOT DETECTED   Enterobacter cloacae complex NOT DETECTED NOT DETECTED   Escherichia coli NOT DETECTED NOT DETECTED   Klebsiella aerogenes NOT DETECTED NOT DETECTED   Klebsiella oxytoca NOT DETECTED NOT DETECTED   Klebsiella pneumoniae NOT DETECTED NOT DETECTED   Proteus species NOT DETECTED NOT DETECTED   Salmonella species NOT DETECTED NOT DETECTED   Serratia  marcescens NOT DETECTED NOT DETECTED   Haemophilus influenzae NOT DETECTED NOT DETECTED   Neisseria meningitidis NOT DETECTED NOT DETECTED   Pseudomonas aeruginosa NOT DETECTED NOT DETECTED   Stenotrophomonas maltophilia NOT DETECTED NOT DETECTED   Candida albicans NOT DETECTED NOT DETECTED   Candida auris NOT DETECTED NOT DETECTED   Candida glabrata NOT DETECTED NOT DETECTED   Candida krusei NOT DETECTED NOT DETECTED   Candida parapsilosis NOT DETECTED NOT DETECTED   Candida tropicalis NOT DETECTED NOT DETECTED   Cryptococcus neoformans/gattii NOT DETECTED NOT DETECTED    Thank you for allowing pharmacy to be a part of this patient's care.  Alycia Rossetti, PharmD, BCPS Infectious Diseases Clinical Pharmacist 03/04/2022 11:32 AM   **Pharmacist phone directory can now be found on [email or web address] ([email or web address]).  Listed under [place].

## 2022-03-04 NOTE — Progress Notes (Signed)
Speech Language Pathology Treatment: Dysphagia  Patient Details Name: Timothy Hubbard MRN: [id number] DOB: Jul 01, 1991 Today's Date: 03/04/2022 Time: 0973-5329 SLP Time Calculation (min) (ACUTE ONLY): 19 min  Assessment / Plan / Recommendation Clinical Impression  Timothy Hubbard was more responsive and interactive today. Speech is dysarthric and slow.  Oral motor function for swallowing is similar with slowed, groping efforts to manipulate POs but improved ability to seal lips, suck from a straw. He demonstrated immediate and explosive coughing with trials of thin liquids, concerning for aspiration. Nectar thick liquids did not elicit a cough response. Recommend beginning a dysphagia 1 diet with nectar thick liquids for today. SLP will follow.   HPI HPI: Timothy Hubbard is a 31 yo man with a hx of HTN, asthma, GERD, ETOH use, here after multiple seizure like episodes and AMS.  Pt not intubated. Failed [facility]. MRI negative.      SLP Plan  Continue with current plan of care      Recommendations for follow up therapy are one component of a multi-disciplinary discharge planning process, led by the attending physician.  Recommendations may be updated based on patient status, additional functional criteria and insurance authorization.    Recommendations  Diet recommendations: Dysphagia 1 (puree);Nectar-thick liquid Liquids provided via: Cup;Straw Medication Administration: Crushed with puree Supervision: Staff to assist with self feeding Compensations: Slow rate Postural Changes and/or Swallow Maneuvers: Seated upright 90 degrees                Oral Care Recommendations: Oral care BID SLP Visit Diagnosis: Dysphagia, oropharyngeal phase (R13.12) Plan: Continue with current plan of care         Timothy Hubbard L. Timothy Ringer, MA CCC/SLP Clinical Specialist - Acute Care SLP Acute Rehabilitation Services Office number [phone]   Timothy Hubbard  03/04/2022, 11:36 AM

## 2022-03-05 DIAGNOSIS — G9340 Encephalopathy, unspecified: Secondary | ICD-10-CM | POA: Diagnosis not present

## 2022-03-05 LAB — CSF CULTURE W GRAM STAIN
Culture: NO GROWTH
Gram Stain: NONE SEEN

## 2022-03-05 LAB — PREPARE RBC (CROSSMATCH)

## 2022-03-05 LAB — BASIC METABOLIC PANEL
Anion gap: 12 (ref 5–15)
BUN: <5 mg/dL — ABNORMAL LOW (ref 6–20)
CO2: 19 mmol/L — ABNORMAL LOW (ref 22–32)
Calcium: 8.2 mg/dL — ABNORMAL LOW (ref 8.9–10.3)
Chloride: 111 mmol/L (ref 98–111)
Creatinine, Ser: 0.66 mg/dL (ref 0.61–1.24)
GFR, Estimated: >60 mL/min (ref >60)
Glucose, Bld: 100 mg/dL — ABNORMAL HIGH (ref 70–99)
Potassium: 3.1 mmol/L — ABNORMAL LOW (ref 3.5–5.1)
Sodium: 142 mmol/L (ref 135–145)

## 2022-03-05 LAB — CBC
HCT: 21.7 % — ABNORMAL LOW (ref 39.0–52.0)
Hemoglobin: 6.5 g/dL — CL (ref 13.0–17.0)
MCH: 23.6 pg — ABNORMAL LOW (ref 26.0–34.0)
MCHC: 30 g/dL (ref 30.0–36.0)
MCV: 78.6 fL — ABNORMAL LOW (ref 80.0–100.0)
Platelets: 175 10*3/uL (ref 150–400)
RBC: 2.76 MIL/uL — ABNORMAL LOW (ref 4.22–5.81)
RDW: 23.7 % — ABNORMAL HIGH (ref 11.5–15.5)
WBC: 5.6 10*3/uL (ref 4.0–10.5)
nRBC: 0 % (ref 0.0–0.2)

## 2022-03-05 LAB — HEMOGLOBIN AND HEMATOCRIT, BLOOD
HCT: 21.4 % — ABNORMAL LOW (ref 39.0–52.0)
Hemoglobin: 6.3 g/dL — CL (ref 13.0–17.0)

## 2022-03-05 LAB — VITAMIN B12: Vitamin B-12: 338 pg/mL (ref 180–914)

## 2022-03-05 LAB — ABO/RH: ABO/RH(D): B POS

## 2022-03-05 LAB — FOLATE: Folate: 8.6 ng/mL (ref >5.9)

## 2022-03-05 MED ORDER — POTASSIUM CHLORIDE CRYS ER 20 MEQ PO TBCR
40.0000 meq | EXTENDED_RELEASE_TABLET | ORAL | Status: AC
  Administered 2022-03-05: 40 meq via ORAL
  Filled 2022-03-05: qty 2

## 2022-03-05 MED ORDER — SODIUM CHLORIDE 0.9% IV SOLUTION: Freq: Once | INTRAVENOUS | Status: AC

## 2022-03-05 NOTE — Progress Notes (Signed)
PROGRESS NOTE    Timothy Hubbard  ZOX:[id number] DOB: September 08, 1990 DOA: 03/01/2022 PCP: Alcus Dad, MD   Brief Narrative:  Timothy Hubbard is a 31 yo man with a hx of HTN, asthma, GERD, ETOH use, here after multiple seizure like episodes and AMS.  He was previously noted to be normal per family, then came out of the bathroom stumbling and fell, without notable trauma. Then had teeth grinding and apparent seizure activity for 7 min.  Was altered, lethargic and confised afterwards. Additional seizure about 1 hour later, and another 1 hr later.  Brought to the ED following the third episode.    Typically non compliant with medications intermittently at baseline - no current active home medications listed on med reconciliation. Per his mother, he has been acting and feeling normal as far as she knows (he doesn't complain to her much and keeps his medical issues private from her).  Some R side pain recently. No hx of etoh withdrawal.    6/28 admitted to ICU with new onset sz activity, LTM, protecting his airway 6/29 no seizure on LTM, head imaging negative, still dysarthric but improving 6/30 Stable transfer to medical floor - requesting IV narcotics (previously on in ICU) which we discussed were not going to be replaced for his chronic lumbago. Will continue OTC medications PRN. 7/1 Fall overnight trying to get out of bed without calling for help - generally being noncompliant. Pulling out IV multiple times - requiring blood transfusion this am.   Assessment & Plan:   Principal Problem:   Encephalopathy acute Active Problems:   TOBACCO USER   Essential hypertension   Asthma in adult, moderate persistent, with acute exacerbation   Gastroesophageal reflux disease   Alcohol use disorder   Fever in adult   Seizure (HCC)   Hypokalemia   Iron deficiency anemia   New onset seizure activity, ? Provoked secondary to etoh decrease Without signs/symptoms of withdrawals. Chronic ETOH use /abuse Acute  Encephalopathy -Etoh intake cutdown recently (unclear if drastic cut or cessation) -Low suspicion meningitis based on CSF results, procal low -CTA and MRI brain unremarkable - Neuro following - continue keppra - EEG unremarkable - UDS positive for cocaine and cannabinoids -Mental status slowly improving, though did not pass swallow screen  -TSH WNL -continue thiamine, folic acid and MV supplementation    Ambulatory dysfunction  -Multifactorial, secondary to above, continue to follow with PT OT -Remains unsafe disposition home at this time given weakness and unclear support at home  Dysphagia -Likely secondary to acute siezure, follows with speech  Hypokalemia Hypomagnesemia In the setting of chronic alcohol abuse/malnutrition K 2.8, Mag 1.9 -Replete as appropriate   Iron deficiency anemia Rule out acute blood loss anemia Hgb drifting down, 8.7->8.0->7.3-->6.3 Fe stores low 2 unit PRBC pending today **patient transiently in restraints due to noncompliance with IV access, will discontinue once blood has been transfused but patient remains high risk for impulsive behavior and IV withdrawal despite lengthy discussion at bedside without risk factors for noncompliance.  Unclear if patient truly understands the consequences of his actions.  HTN -Blood pressure increasing -No listed home medications, will start low-dose amlodipine and follow clinically -Currently well controlled   GERD -continue protonix.    Asthma, tobacco use -prn nebs, not currently wheezing  DVT prophylaxis: Lovenox Code Status: Full Family Communication: None present at the time of interview  Status is: Inpt  Dispo: The patient is from: Home  Anticipated d/c is to: Home              Anticipated d/c date is: 24-48h              Patient currently NOT medically stable for discharge  Consultants:  PCCM, Neuro  Procedures:  None  Antimicrobials:  None   Subjective: Mechanical fall  overnight while attempting to ambulate without assistance.  Denies nausea vomiting diarrhea constipation headache fevers chills chest pain.  Hemoglobin downtrending overnight as well but remains asymptomatic.  Objective: Vitals:   03/05/22 0236 03/05/22 0349 03/05/22 0500 03/05/22 0729  BP: (!) 132/105 (!) 134/97  (!) 144/97  Pulse: 95 97  87  Resp: '18 18  18  '$ Temp: 97.8 F (36.6 C) 98.1 F (36.7 C)  98.2 F (36.8 C)  TempSrc: Oral     SpO2: 100% 100%  100%  Weight:   61.7 kg   Height:        Intake/Output Summary (Last 24 hours) at 03/05/2022 0813 Last data filed at 03/04/2022 1526 Gross per 24 hour  Intake 1031.25 ml  Output 600 ml  Net 431.25 ml    Filed Weights   03/02/22 0131 03/04/22 0524 03/05/22 0500  Weight: 55 kg 61.3 kg 61.7 kg    Examination:  General exam: Appears calm and comfortable  Respiratory system: Clear to auscultation. Respiratory effort normal. Cardiovascular system: S1 & S2 heard, RRR. No JVD, murmurs, rubs, gallops or clicks. No pedal edema. Gastrointestinal system: Abdomen is nondistended, soft and nontender. No organomegaly or masses felt. Normal bowel sounds heard. Central nervous system: Alert and oriented. No focal neurological deficits. Extremities: Symmetric 5 x 5 power. Skin: No rashes, lesions or ulcers Psychiatry: Poor judgement and insight, mood & affect labile   Data Reviewed: I have personally reviewed following labs and imaging studies  CBC: Recent Labs  Lab 03/01/22 2133 03/02/22 0640 03/03/22 0842 03/04/22 0901 03/04/22 1105 03/05/22 0358 03/05/22 0645  WBC 12.8* 9.0 5.9 4.7  --  5.6  --   NEUTROABS 9.5* 7.2  --   --   --   --   --   HGB 8.7* 8.0* 7.3* 6.5* 7.4* 6.5* 6.3*  HCT 30.9* 26.8* 25.2* 22.7* 25.4* 21.7* 21.4*  MCV 80.3 78.6* 79.2* 79.1*  --  78.6*  --   PLT 305 243 213 179  --  175  --     Basic Metabolic Panel: Recent Labs  Lab 03/01/22 2121 03/02/22 0640 03/03/22 0842 03/04/22 0901 03/05/22 0358  NA  141 138 140 140 142  K 3.9 3.1* 2.8* 3.2* 3.1*  CL 103 103 105 111 111  CO2 12* 19* 23 22 19*  GLUCOSE 119* 126* 101* 110* 100*  BUN 10 5* 7 5* <5*  CREATININE 1.29* 0.83 0.70 0.67 0.66  CALCIUM 10.0 8.8* 8.4* 8.1* 8.2*  MG 2.0 1.7 1.9  --   --   PHOS  --   --  3.1  --   --     GFR: Estimated Creatinine Clearance: 111 mL/min (by C-G formula based on SCr of 0.66 mg/dL). Liver Function Tests: Recent Labs  Lab 03/01/22 2121  AST 57*  ALT <5  ALKPHOS 64  BILITOT 1.3*  PROT 7.2  ALBUMIN 4.1    No results for input(s): "LIPASE", "AMYLASE" in the last 168 hours. Recent Labs  Lab 03/02/22 0640  AMMONIA 19    Coagulation Profile: Recent Labs  Lab 03/01/22 2133  INR 0.9    Cardiac  Enzymes: No results for input(s): "CKTOTAL", "CKMB", "CKMBINDEX", "TROPONINI" in the last 168 hours. BNP (last 3 results) No results for input(s): "PROBNP" in the last 8760 hours. HbA1C: Recent Labs    03/03/22 0842  HGBA1C <4.2*   CBG: Recent Labs  Lab 03/03/22 0717 03/03/22 1209 03/03/22 1547 03/04/22 0839 03/04/22 1156  GLUCAP 104* 103* 105* 115* 145*    Lipid Profile: No results for input(s): "CHOL", "HDL", "LDLCALC", "TRIG", "CHOLHDL", "LDLDIRECT" in the last 72 hours. Thyroid Function Tests: No results for input(s): "TSH", "T4TOTAL", "FREET4", "T3FREE", "THYROIDAB" in the last 72 hours.  Anemia Panel: Recent Labs    03/03/22 0842  FERRITIN 43  TIBC 300  IRON 13*    Sepsis Labs: Recent Labs  Lab 03/01/22 0038 03/01/22 2122 03/02/22 0030 03/02/22 0640  PROCALCITON  --   --  0.12  --   LATICACIDVEN 7.5* >9.0*  --  3.2*     Recent Results (from the past 240 hour(s))  Resp Panel by RT-PCR (Flu A&B, Covid) Anterior Nasal Swab     Status: None   Collection Time: 03/01/22 12:48 AM   Specimen: Anterior Nasal Swab  Result Value Ref Range Status   SARS Coronavirus 2 by RT PCR NEGATIVE NEGATIVE Final    Comment: (NOTE) SARS-CoV-2 target nucleic acids are NOT  DETECTED.  The SARS-CoV-2 RNA is generally detectable in upper respiratory specimens during the acute phase of infection. The lowest concentration of SARS-CoV-2 viral copies this assay can detect is 138 copies/mL. A negative result does not preclude SARS-Cov-2 infection and should not be used as the sole basis for treatment or other patient management decisions. A negative result may occur with  improper specimen collection/handling, submission of specimen other than nasopharyngeal swab, presence of viral mutation(s) within the areas targeted by this assay, and inadequate number of viral copies(<138 copies/mL). A negative result must be combined with clinical observations, patient history, and epidemiological information. The expected result is Negative.  Fact Sheet for Patients:  EntrepreneurPulse.com.au  Fact Sheet for Healthcare Providers:  IncredibleEmployment.be  This test is no t yet approved or cleared by the [place] FDA and  has been authorized for detection and/or diagnosis of SARS-CoV-2 by FDA under an Emergency Use Authorization (EUA). This EUA will remain  in effect (meaning this test can be used) for the duration of the COVID-19 declaration under Section 564(b)(1) of the Act, 21 U.S.C.section 360bbb-3(b)(1), unless the authorization is terminated  or revoked sooner.       Influenza A by PCR NEGATIVE NEGATIVE Final   Influenza B by PCR NEGATIVE NEGATIVE Final    Comment: (NOTE) The Xpert Xpress SARS-CoV-2/FLU/RSV plus assay is intended as an aid in the diagnosis of influenza from Nasopharyngeal swab specimens and should not be used as a sole basis for treatment. Nasal washings and aspirates are unacceptable for Xpert Xpress SARS-CoV-2/FLU/RSV testing.  Fact Sheet for Patients: EntrepreneurPulse.com.au  Fact Sheet for Healthcare Providers: IncredibleEmployment.be  This test is not yet  approved or cleared by the [place] FDA and has been authorized for detection and/or diagnosis of SARS-CoV-2 by FDA under an Emergency Use Authorization (EUA). This EUA will remain in effect (meaning this test can be used) for the duration of the COVID-19 declaration under Section 564(b)(1) of the Act, 21 U.S.C. section 360bbb-3(b)(1), unless the authorization is terminated or revoked.  Performed at [facility], [place] [address] [place], [place] [address]   Blood Culture (routine x 2)     Status:  None (Preliminary result)   Collection Time: 03/01/22  9:54 PM   Specimen: BLOOD  Result Value Ref Range Status   Specimen Description   Final    BLOOD RIGHT ANTECUBITAL Performed at [facility] [address], [place], [place] [address]    Special Requests   Final    BOTTLES DRAWN AEROBIC AND ANAEROBIC Blood Culture adequate volume Performed at [facility] [address] [place], [place] [address]    Culture   Final    NO GROWTH 2 DAYS Performed at [facility] [address], [place], [place] [address]    Report Status PENDING  Incomplete  Blood Culture (routine x 2)     Status: None (Preliminary result)   Collection Time: 03/01/22  9:57 PM   Specimen: BLOOD  Result Value Ref Range Status   Specimen Description   Final    BLOOD LEFT ANTECUBITAL Performed at [facility] [address] [place], [place] [address]    Special Requests   Final    BOTTLES DRAWN AEROBIC AND ANAEROBIC Blood Culture adequate volume Performed at [facility] [address], [place], [place] [address]    Culture  Setup Time   Final    GRAM POSITIVE COCCI IN CLUSTERS AEROBIC BOTTLE ONLY Organism ID to follow CRITICAL RESULT CALLED TO, READ BACK BY AND VERIFIED WITH:  C/ PHARMD ELIZABETH M. 03/04/22 1115 A. LAFRANCE Performed at [facility], [place] [address], [place], [place] [address]     Culture GRAM POSITIVE COCCI  Final   Report Status PENDING  Incomplete  Blood Culture ID Panel (Reflexed)     Status: Abnormal   Collection Time: 03/01/22  9:57 PM  Result Value Ref Range Status   Enterococcus faecalis NOT DETECTED NOT DETECTED Final   Enterococcus Faecium NOT DETECTED NOT DETECTED Final   Listeria monocytogenes NOT DETECTED NOT DETECTED Final   Staphylococcus species DETECTED (A) NOT DETECTED Final    Comment: CRITICAL RESULT CALLED TO, READ BACK BY AND VERIFIED WITH:  C/ PHARMD ELIZABETH M. 03/04/22 1115 A. LAFRANCE    Staphylococcus aureus (BCID) NOT DETECTED NOT DETECTED Final   Staphylococcus epidermidis NOT DETECTED NOT DETECTED Final   Staphylococcus lugdunensis NOT DETECTED NOT DETECTED Final   Streptococcus species NOT DETECTED NOT DETECTED Final   Streptococcus agalactiae NOT DETECTED NOT DETECTED Final   Streptococcus pneumoniae NOT DETECTED NOT DETECTED Final   Streptococcus pyogenes NOT DETECTED NOT DETECTED Final   A.calcoaceticus-baumannii NOT DETECTED NOT DETECTED Final   Bacteroides fragilis NOT DETECTED NOT DETECTED Final   Enterobacterales NOT DETECTED NOT DETECTED Final   Enterobacter cloacae complex NOT DETECTED NOT DETECTED Final   Escherichia coli NOT DETECTED NOT DETECTED Final   Klebsiella aerogenes NOT DETECTED NOT DETECTED Final   Klebsiella oxytoca NOT DETECTED NOT DETECTED Final   Klebsiella pneumoniae NOT DETECTED NOT DETECTED Final   Proteus species NOT DETECTED NOT DETECTED Final   Salmonella species NOT DETECTED NOT DETECTED Final   Serratia marcescens NOT DETECTED NOT DETECTED Final   Haemophilus influenzae NOT DETECTED NOT DETECTED Final   Neisseria meningitidis NOT DETECTED NOT DETECTED Final   Pseudomonas aeruginosa NOT DETECTED NOT DETECTED Final   Stenotrophomonas maltophilia NOT DETECTED NOT DETECTED Final   Candida albicans NOT DETECTED NOT DETECTED Final   Candida auris NOT DETECTED NOT DETECTED Final   Candida glabrata  NOT DETECTED NOT DETECTED Final   Candida krusei NOT DETECTED NOT DETECTED  Final   Candida parapsilosis NOT DETECTED NOT DETECTED Final   Candida tropicalis NOT DETECTED NOT DETECTED Final   Cryptococcus neoformans/gattii NOT DETECTED NOT DETECTED Final    Comment: Performed at [facility], [address], [place], [place] [address]  CSF culture     Status: None (Preliminary result)   Collection Time: 03/01/22 11:16 PM   Specimen: Back; Cerebrospinal Fluid  Result Value Ref Range Status   Specimen Description   Final    BACK Performed at [facility] [address], [place], [place] [address]    Special Requests   Final    NONE Performed at [facility], [place] [address], [place], [place] [address]    Gram Stain   Final    NO WBC SEEN NO ORGANISMS SEEN CYTOSPIN SMEAR Gram Stain Report Called to,Read Back By and Verified With: CALLED TO KELSEY,A ON [id number] AT [place]    Culture   Final    NO GROWTH 2 DAYS Performed at [facility], [place] [address] [place], [place] [address]    Report Status PENDING  Incomplete  Urine Culture     Status: None   Collection Time: 03/02/22  1:24 AM   Specimen: In/Out Cath Urine  Result Value Ref Range Status   Specimen Description IN/OUT CATH URINE  Final   Special Requests NONE  Final   Culture   Final    NO GROWTH Performed at [facility], [place] [address], [place], [place] [address]    Report Status 03/03/2022 FINAL  Final  MRSA Next Gen by PCR, Nasal     Status: None   Collection Time: 03/02/22  1:33 AM   Specimen: Nasal Mucosa; Nasal Swab  Result Value Ref Range Status   MRSA by PCR Next Gen NOT DETECTED NOT DETECTED Final    Comment: (NOTE) The GeneXpert MRSA Assay (FDA approved for NASAL specimens only), is one component of a comprehensive MRSA colonization surveillance program. It is not intended to diagnose MRSA infection nor to guide or monitor treatment for MRSA  infections. Test performance is not FDA approved in patients less than 52 years old. Performed at [facility], [place] [address] [place], [place] [address]          Radiology Studies: No results found.  Scheduled Meds:  sodium chloride   Intravenous Once   Chlorhexidine Gluconate Cloth  6 each Topical P8242   folic acid  1 mg Intravenous Daily   mometasone-formoterol  2 puff Inhalation BID   multivitamin with minerals  1 tablet Oral Daily   mouth rinse  15 mL Mouth Rinse 4 times per day   mouth rinse  15 mL Mouth Rinse 4 times per day   pantoprazole (PROTONIX) IV  40 mg Intravenous Q24H   thiamine injection  100 mg Intravenous Daily   Continuous Infusions:  dextrose 5 % and 0.9% NaCl 75 mL/hr at 03/04/22 1600   levETIRAcetam 500 mg (03/04/22 2030)     LOS: 4 days   Time spent: 31mn  Nary Sneed C Lequisha Cammack, DO [facility]  If 7PM-7AM, please contact night-coverage [email or web address]  03/05/2022, 8:13 AM

## 2022-03-05 NOTE — Progress Notes (Signed)
HOSPITAL MEDICINE OVERNIGHT EVENT NOTE    Nursing reports that patient experienced a fall.  Patient was attempting to get out of bed without assistance and unfortunately became unsteady and fell to the floor.  Nursing reports that patient denies hitting their head and denies loss of consciousness.  On nursing assessment there is no visual evidence of injury.  Considering the lack of injury and lack of concerning symptoms no further work-up needed at this time.  We will continue to monitor.  Bed alarms should be initiated and patient educated on need for nursing assistance with ambulation going forward.  Vernelle Emerald  MD [facility]

## 2022-03-05 NOTE — Progress Notes (Addendum)
HOSPITAL MEDICINE OVERNIGHT EVENT NOTE    Notified by nursing that patient's hemoglobin is low once again, now at 6.5.  Patient was also 6.5 yesterday morning and upon repeating the hemoglobin it was found to be 7.4.  Nursing reports no clinical evidence of bleeding or heavy bruising.  Patient denies shortness of breath or chest pain.  Patient is hemodynamically stable.  Chart reviewed, patient has a longstanding history of alcohol abuse and therefore poor dietary intake of iron as well as myelosuppression from ongoing alcohol use may be playing a role.  Iron panel is already been obtained on 6/29.  We will additionally obtain a type and screen, folate, vitamin B12 and stool Hemoccult testing.  Finally we will perform a 1 unit packed red blood cell transfusion.  If hemoglobin continues to downtrend or patient exhibits any clinical evidence of bleeding will consider gastroenterology consultation.  Vernelle Emerald  MD [facility]

## 2022-03-05 NOTE — Progress Notes (Signed)
Tele called this writer to check on patient's leads, on getting to the room, patient was  found on the floor. Patient denied hitting his head. Neuro checks within baseline. MD paged to notify. Patient's family called, but no response at this time. Will continue to monitor.

## 2022-03-05 NOTE — Progress Notes (Signed)
Critical Hbg of 6.5 called on the patient. MD paged to notify

## 2022-03-05 NOTE — Progress Notes (Signed)
Patient very agitated. Pulling out IV's. Spitting medications at staff. Trying to get out of bed. Notified MD. Soft wrist restraints ordered.

## 2022-03-05 NOTE — Progress Notes (Signed)
Physical Therapy Treatment Patient Details Name: Timothy Hubbard MRN: [id number] DOB: 03/25/1991 Today's Date: 03/05/2022   History of Present Illness Timothy Hubbard is a 31 yo man with a hx of HTN, asthma, GERD, ETOH use, here after multiple seizure like episodes and AMS.    He was normal, then came out of the bathroom stumbling and fell, no head trauma. Then had teeth grinding and apparent seizure activity for 7 min.  Was altered, lethargic and confised afterwards.  Additional seizure about 1 hour later, and another 1 hr later.  Brought to the ED following the third episode    PT Comments    Pt progressing towards all goals. Pt remains to be impulsive with decreased insight to safety and deficits requiring max verbal and tactile cues in addition to modA for safety for mobility. Pt perseverating on keeping backside covered despite multiple verbal cues stating it was with a second gown which caused pt to be retropulsive limiting ambulation to about 10' with L HHA. Pt with both cognitive and functional deficits and would greatly benefit from AIR upon d/c. Acute PT to cont to follow.   Recommendations for follow up therapy are one component of a multi-disciplinary discharge planning process, led by the attending physician.  Recommendations may be updated based on patient status, additional functional criteria and insurance authorization.  Follow Up Recommendations  Acute inpatient rehab (3hours/day)     Assistance Recommended at Discharge Frequent or constant Supervision/Assistance  Patient can return home with the following A lot of help with walking and/or transfers;A lot of help with bathing/dressing/bathroom;Assist for transportation;Help with stairs or ramp for entrance;Assistance with cooking/housework;Direct supervision/assist for medications management   Equipment Recommendations  Rolling walker (2 wheels)    Recommendations for Other Services Rehab consult     Precautions / Restrictions  Precautions Precautions: Fall;Other (comment) Precaution Comments: seizure Restrictions Weight Bearing Restrictions: No     Mobility  Bed Mobility Overal bed mobility: Needs Assistance Bed Mobility: Supine to Sit     Supine to sit: HOB elevated, +2 for safety/equipment, Min assist     General bed mobility comments: impulsive with mobility, poor safety awareness, perseverating on covering backside despite being covered by back gown, pt requiring max verbal and tactile cues to tend to task    Transfers Overall transfer level: Needs assistance Equipment used: 1 person hand held assist (2nd person for IV line) Transfers: Sit to/from Stand, Bed to chair/wheelchair/BSC Sit to Stand: Min assist, +2 safety/equipment           General transfer comment: Pt very impulsive with perseveration on covering up backside requiring max verbal and tactile cues to tend to task and assure pt's backside was covered, pt eventually followed pt's verbal commands and was able to stand without retropulsion    Ambulation/Gait Ambulation/Gait assistance: Min assist, Mod assist Gait Distance (Feet): 10 Feet Assistive device: 1 person hand held assist (on L) Gait Pattern/deviations: Step-through pattern, Decreased dorsiflexion - right, Staggering left, Staggering right, Leaning posteriorly       General Gait Details: pt impulsive, retropulsive as was perseverating on pulling gown in back but eventually listend to max verbal and tactile cues from his mother and PT and was able to amb around bed to the chair   Stairs             Wheelchair Mobility    Modified Rankin (Stroke Patients Only)       Balance Overall balance assessment: Needs assistance Sitting-balance support: Bilateral upper  extremity supported, Feet supported Sitting balance-Leahy Scale: Poor Sitting balance - Comments: pt with retropulsion and truncal ataxia in addition to decreased safety awareness and decreased insight to  deficits, pt unaware of leaning backwards Postural control: Posterior lean Standing balance support: Single extremity supported Standing balance-Leahy Scale: Poor Standing balance comment: pt with retropulsion due to perseveration on keeping back covered but eventually then ambulated with L HHA                            Cognition Arousal/Alertness: Awake/alert Behavior During Therapy: Impulsive Overall Cognitive Status: Difficult to assess                                 General Comments: Decreased safety awareness and deficit awareness        Exercises      General Comments General comments (skin integrity, edema, etc.): pt receiving blood, in bilat wrist restraints      Pertinent Vitals/Pain Pain Assessment Pain Assessment: Faces Faces Pain Scale: No hurt    Home Living   Living Arrangements: Parent Available Help at Discharge: Family;Available 24 hours/day Type of Home: House Home Access: Stairs to enter Entrance Stairs-Rails: Can reach both Entrance Stairs-Number of Steps: 5   Home Layout: One level        Prior Function            PT Goals (current goals can now be found in the care plan section) Acute Rehab PT Goals Patient Stated Goal: none stated PT Goal Formulation: With patient Time For Goal Achievement: 03/18/22 Potential to Achieve Goals: Good Progress towards PT goals: Progressing toward goals    Frequency    Min 3X/week      PT Plan Current plan remains appropriate    Co-evaluation              AM-PAC PT "6 Clicks" Mobility   Outcome Measure  Help needed turning from your back to your side while in a flat bed without using bedrails?: A Little Help needed moving from lying on your back to sitting on the side of a flat bed without using bedrails?: A Little Help needed moving to and from a bed to a chair (including a wheelchair)?: A Lot Help needed standing up from a chair using your arms (e.g.,  wheelchair or bedside chair)?: A Lot Help needed to walk in hospital room?: Total Help needed climbing 3-5 steps with a railing? : Total 6 Click Score: 12    End of Session Equipment Utilized During Treatment: Gait belt Activity Tolerance: Patient tolerated treatment well Patient left: with call bell/phone within reach;in chair;with chair alarm set;with family/visitor present Nurse Communication: Mobility status PT Visit Diagnosis: Unsteadiness on feet (R26.81);Other abnormalities of gait and mobility (R26.89);Muscle weakness (generalized) (M62.81);Difficulty in walking, not elsewhere classified (R26.2)     Time: 1205-1225 PT Time Calculation (min) (ACUTE ONLY): 20 min  Charges:  $Gait Training: 8-22 mins                     Kittie Plater, PT, DPT Acute Rehabilitation Services Secure chat preferred Office #: [phone]    Berline Lopes 03/05/2022, 1:21 PM

## 2022-03-05 NOTE — Progress Notes (Signed)
Inpatient Rehab Admissions:  Inpatient Rehab Consult received.  I met with patient at the bedside for rehabilitation assessment and to discuss goals and expectations of an inpatient rehab admission.  Pt acknowledged understanding of CIR. Pt interested in pursuing CIR. Pt gave permission to contact mother, Geni Bers. Spoke with Geni Bers on the telephone. She acknowledged understanding of CIR goals and expectations. She is supportive of pt pursuing CIR. She confirmed that she along with other family/friends will be able to provide support for pt after d/c. Will continue to follow.  Signed: Gayland Curry, [place], [place] Admissions Coordinator [phone]

## 2022-03-06 DIAGNOSIS — R569 Unspecified convulsions: Secondary | ICD-10-CM | POA: Diagnosis not present

## 2022-03-06 LAB — CBC
HCT: 29.1 % — ABNORMAL LOW (ref 39.0–52.0)
Hemoglobin: 9.4 g/dL — ABNORMAL LOW (ref 13.0–17.0)
MCH: 25.5 pg — ABNORMAL LOW (ref 26.0–34.0)
MCHC: 32.3 g/dL (ref 30.0–36.0)
MCV: 78.9 fL — ABNORMAL LOW (ref 80.0–100.0)
Platelets: 186 10*3/uL (ref 150–400)
RBC: 3.69 MIL/uL — ABNORMAL LOW (ref 4.22–5.81)
RDW: 21.2 % — ABNORMAL HIGH (ref 11.5–15.5)
WBC: 8.7 10*3/uL (ref 4.0–10.5)
nRBC: 0 % (ref 0.0–0.2)

## 2022-03-06 LAB — BPAM RBC
Blood Product Expiration Date: 202307272359
Blood Product Expiration Date: 202307272359
ISSUE DATE / TIME: 202307011040
ISSUE DATE / TIME: 202307011627
Unit Type and Rh: 7300
Unit Type and Rh: 7300

## 2022-03-06 LAB — TYPE AND SCREEN
ABO/RH(D): B POS
Antibody Screen: NEGATIVE
Unit division: 0
Unit division: 0

## 2022-03-06 LAB — BASIC METABOLIC PANEL
Anion gap: 8 (ref 5–15)
BUN: <5 mg/dL — ABNORMAL LOW (ref 6–20)
CO2: 19 mmol/L — ABNORMAL LOW (ref 22–32)
Calcium: 8.4 mg/dL — ABNORMAL LOW (ref 8.9–10.3)
Chloride: 114 mmol/L — ABNORMAL HIGH (ref 98–111)
Creatinine, Ser: 0.68 mg/dL (ref 0.61–1.24)
GFR, Estimated: >60 mL/min (ref >60)
Glucose, Bld: 89 mg/dL (ref 70–99)
Potassium: 3.3 mmol/L — ABNORMAL LOW (ref 3.5–5.1)
Sodium: 141 mmol/L (ref 135–145)

## 2022-03-06 LAB — CULTURE, BLOOD (ROUTINE X 2): Special Requests: ADEQUATE

## 2022-03-06 NOTE — PMR Pre-admission (Signed)
PMR Admission Coordinator Pre-Admission Assessment  Patient: Timothy Hubbard is an 31 y.o., male MRN: [id number] DOB: Feb 01, 1991 Height: 5' 4"  (162.6 cm) Weight: 61.7 kg  Insurance Information HMO:     PPO:      PCP:      IPA:      80/20:      OTHER:  PRIMARY: [place] Medicaid UHC      Policy#: [id number]      Subscriber: patient CM Name: ***      Phone#: ***     Fax#: [phone] Pre-Cert#: [id number]      Employer:  Benefits:  Phone #: [email or web address]     Name:  Eff. Date: 03/05/21-still active     Deduct: $0 (does not have deductible)      Out of Pocket Max: $0      Life Max: NA CIR: 100% coverage      SNF: 100% coverage Outpatient: 100% coverage for 27 visits/cal year     Co-Pay:  Home Health: 100% coverage      Co-Pay:  DME: 100% coverage     Co-Pay:  Providers: in-network SECONDARY:       Policy#:      Phone#:   Development worker, community:       Phone#:   [employer] Engineer, [employer]" for patients in Inpatient Rehabilitation Facilities with attached "Privacy Act [place] Records" was provided and verbally reviewed with: {CHL IP Patient Family KN:[id number]}  Emergency Contact Information Contact Information     Name Relation Home Work Mobile   Deese,Jacqueline Mother [phone]         Current Medical History  Patient Admitting Diagnosis: encephalopathy, seizures History of Present Illness: Pt is a 31 year old male with medical hx significant for: HTN, GERD, bipolar disorder, schizophrenia, asthma, ETOH use. Pt presented to [facility] on 03/01/22 d/t AMS. Pt had 3 seizures prior to being brought to ED. CT head and chest x-ray showed no acute findings. LP performed in ED. Results not consistent with infectious or asceptic meningitis. Pt transferred to [facility] on 6/28 for further workup. UDS +benzos, cocaine, THC. LTM EEG showed no seizures or epileptiform discharges. CTA and MRI brain on 6/29 were unremarkable. On 7/1, pt's hemoglobin  was 6.5; received 2 units PRBC. Therapy evaluations completed and CIR recommended d/t pt's deficits in functional mobility, inability to complete ADLs independently, dysphagia, dysarthria.    Patient's medical record from [facility] has been reviewed by the rehabilitation admission coordinator and physician.  Past Medical History  Past Medical History:  Diagnosis Date   Asthma    HTN (hypertension)     Has the patient had major surgery during 100 days prior to admission? No  Family History   family history includes Cancer in his maternal grandfather; Hypertension in his mother.  Current Medications  Current Facility-Administered Medications:    acetaminophen (TYLENOL) 160 MG/5ML solution 650 mg, 650 mg, Oral, Q6H PRN, Little Ishikawa, MD   acetaminophen (TYLENOL) tablet 650 mg, 650 mg, Oral, Q6H PRN, Little Ishikawa, MD   albuterol (PROVENTIL) (2.5 MG/3ML) 0.083% nebulizer solution 3 mL, 3 mL, Inhalation, Q4H PRN, Collier Bullock, MD   Chlorhexidine Gluconate Cloth 2 % PADS 6 each, 6 each, Topical, Q0600, Collier Bullock, MD, 6 each at 03/06/22 0600   dextrose 5 %-0.9 % sodium chloride infusion, , Intravenous, Continuous, Gleason, Otilio Carpen, PA-C, Last Rate: 75 mL/hr at 03/04/22 1600, New Bag at 03/04/22 1600   docusate sodium (  COLACE) capsule 100 mg, 100 mg, Oral, BID PRN, Collier Bullock, MD, 100 mg at 38/46/65 9935   folic acid injection 1 mg, 1 mg, Intravenous, Daily, Spero Geralds, MD, 1 mg at 03/05/22 1132   food thickener (SIMPLYTHICK (NECTAR/LEVEL 2/MILDLY THICK)) 10 packet, 10 packet, Oral, PRN, Little Ishikawa, MD   levETIRAcetam (KEPPRA) IVPB 500 mg/100 mL premix, 500 mg, Intravenous, Q12H, Collier Bullock, MD, Last Rate: 400 mL/hr at 03/06/22 0742, 500 mg at 03/06/22 0742   midazolam (VERSED) injection 1-2 mg, 1-2 mg, Intravenous, Q1H PRN, Collier Bullock, MD, 2 mg at 03/06/22 0740   mometasone-formoterol (DULERA) 100-5 MCG/ACT inhaler 2 puff, 2  puff, Inhalation, BID, Collier Bullock, MD, 2 puff at 03/06/22 0759   multivitamin with minerals tablet 1 tablet, 1 tablet, Oral, Daily, Collier Bullock, MD, 1 tablet at 03/05/22 0947   Oral care mouth rinse, 15 mL, Mouth Rinse, 4 times per day, Collier Bullock, MD, 15 mL at 03/05/22 2147   Oral care mouth rinse, 15 mL, Mouth Rinse, PRN, Collier Bullock, MD   Oral care mouth rinse, 15 mL, Mouth Rinse, 4 times per day, Spero Geralds, MD, 15 mL at 03/05/22 1709   Oral care mouth rinse, 15 mL, Mouth Rinse, PRN, Spero Geralds, MD   pantoprazole (PROTONIX) injection 40 mg, 40 mg, Intravenous, Q24H, Collier Bullock, MD, 40 mg at 03/05/22 0211   polyethylene glycol (MIRALAX / GLYCOLAX) packet 17 g, 17 g, Oral, Daily PRN, Collier Bullock, MD   thiamine (B-1) injection 100 mg, 100 mg, Intravenous, Daily, Spero Geralds, MD, 100 mg at 03/05/22 7017  Patients Current Diet:  Diet Order             DIET - DYS 1 Room service appropriate? Yes with Assist; Fluid consistency: Nectar Thick  Diet effective now                   Precautions / Restrictions Precautions Precautions: Fall, Other (comment) Precaution Comments: seizure Restrictions Weight Bearing Restrictions: No   Has the patient had 2 or more falls or a fall with injury in the past year? Yes  Prior Activity Level Limited Community (1-2x/wk): drives;gets out of house 1-2 days/week  Prior Functional Level Self Care: Did the patient need help bathing, dressing, using the toilet or eating? Independent  Indoor Mobility: Did the patient need assistance with walking from room to room (with or without device)? Independent  Stairs: Did the patient need assistance with internal or external stairs (with or without device)? Independent  Functional Cognition: Did the patient need help planning regular tasks such as shopping or remembering to take medications? Per mother, pt didn't take his medications  Patient Information Are you  of Hispanic, Latino/a,or Spanish origin?: A. No, not of Hispanic, Latino/a, or Spanish origin What is your race?: B. Black or African American Do you need or want an interpreter to communicate with a doctor or health care staff?: 0. No  Patient's Response To:  Health Literacy and Transportation Is the patient able to respond to health literacy and transportation needs?: Yes Health Literacy - How often do you need to have someone help you when you read instructions, pamphlets, or other written material from your doctor or pharmacy?: Sometimes In the past 12 months, has lack of transportation kept you from medical appointments or from getting medications?: No In the past 12 months, has lack of transportation kept you from meetings, work, or from getting things needed for daily living?: No  Home Assistive Devices / Equipment Home Equipment: None  Prior Device Use: Indicate devices/aids used by the patient prior to current illness, exacerbation or injury? None of the above  Current Functional Level Cognition  Overall Cognitive Status: Difficult to assess Difficult to assess due to: Impaired communication Orientation Level: Oriented to person, Disoriented to place, Disoriented to time, Disoriented to situation General Comments: Decreased safety awareness and deficit awareness    Extremity Assessment (includes Sensation/Coordination)  Upper Extremity Assessment: Generalized weakness (ataxic)  Lower Extremity Assessment: Generalized weakness    ADLs  Overall ADL's : Needs assistance/impaired Eating/Feeding: Minimal assistance, Bed level Grooming: Minimal assistance, Bed level Upper Body Bathing: Minimal assistance, Bed level Lower Body Bathing: Maximal assistance, +2 for safety/equipment, +2 for physical assistance, Sit to/from stand, Sitting/lateral leans Upper Body Dressing : Minimal assistance, Bed level Lower Body Dressing: Maximal assistance, +2 for safety/equipment, +2 for physical  assistance, Sit to/from stand, Sitting/lateral leans Toilet Transfer: Minimal assistance, +2 for safety/equipment, Stand-pivot Toileting- Clothing Manipulation and Hygiene: Maximal assistance, +2 for physical assistance, +2 for safety/equipment, Sit to/from stand General ADL Comments: Pt is very impulsive and trying to do things on his own, however he is requiring increased assist to complete tasks.    Mobility  Overal bed mobility: Needs Assistance Bed Mobility: Supine to Sit Supine to sit: HOB elevated, +2 for safety/equipment, Min assist Sit to supine: Min guard, +2 for safety/equipment General bed mobility comments: impulsive with mobility, poor safety awareness, perseverating on covering backside despite being covered by back gown, pt requiring max verbal and tactile cues to tend to task    Transfers  Overall transfer level: Needs assistance Equipment used: 1 person hand held assist (2nd person for IV line) Transfers: Sit to/from Stand, Bed to chair/wheelchair/BSC Sit to Stand: Min assist, +2 safety/equipment Bed to/from chair/wheelchair/BSC transfer type:: Stand pivot Stand pivot transfers: Min assist, +2 safety/equipment General transfer comment: Pt very impulsive with perseveration on covering up backside requiring max verbal and tactile cues to tend to task and assure pt's backside was covered, pt eventually followed pt's verbal commands and was able to stand without retropulsion    Ambulation / Gait / Stairs / Wheelchair Mobility  Ambulation/Gait Ambulation/Gait assistance: Min assist, Mod assist Gait Distance (Feet): 10 Feet Assistive device: 1 person hand held assist (on L) Gait Pattern/deviations: Step-through pattern, Decreased dorsiflexion - right, Staggering left, Staggering right, Leaning posteriorly General Gait Details: pt impulsive, retropulsive as was perseverating on pulling gown in back but eventually listend to max verbal and tactile cues from his mother and PT and  was able to amb around bed to the chair    Posture / Balance Dynamic Sitting Balance Sitting balance - Comments: pt with retropulsion and truncal ataxia in addition to decreased safety awareness and decreased insight to deficits, pt unaware of leaning backwards Balance Overall balance assessment: Needs assistance Sitting-balance support: Bilateral upper extremity supported, Feet supported Sitting balance-Leahy Scale: Poor Sitting balance - Comments: pt with retropulsion and truncal ataxia in addition to decreased safety awareness and decreased insight to deficits, pt unaware of leaning backwards Postural control: Posterior lean Standing balance support: Single extremity supported Standing balance-Leahy Scale: Poor Standing balance comment: pt with retropulsion due to perseveration on keeping back covered but eventually then ambulated with L HHA    Special needs/care consideration Continuous Drip IV  ***, Bladder incontinence, External urinary catheter   Previous Home Environment (from acute therapy documentation) Living Arrangements: Parent  Lives With: Family Available Help at Discharge: Family,  Available 24 hours/day Type of Home: House Home Layout: One level Home Access: Stairs to enter Entrance Stairs-Rails: Can reach both Entrance Stairs-Number of Steps: 5 Bathroom Shower/Tub: Optometrist: Yes How Accessible: Accessible via walker Home Care Services: No  Discharge Living Setting Plans for Discharge Living Setting: Patient's home Type of Home at Discharge: House Discharge Home Layout: One level Discharge Home Access: Stairs to enter Entrance Stairs-Rails: Can reach both Entrance Stairs-Number of Steps: 5 Discharge Bathroom Shower/Tub: Tub/shower unit Discharge Bathroom Toilet: Standard Discharge Bathroom Accessibility: Yes How Accessible: Accessible via walker Does the patient have any problems obtaining your  medications?: No  Social/Family/Support Systems Anticipated Caregiver: Charlestine Night, mother and family/friends Anticipated Caregiver's Contact Information: [phone] Caregiver Availability: 24/7 Discharge Plan Discussed with Primary Caregiver: Yes Is Caregiver In Agreement with Plan?: Yes Does Caregiver/Family have Issues with Lodging/Transportation while Pt is in Rehab?: No  Goals Patient/Family Goal for Rehab: *** Expected length of stay: *** Pt/Family Agrees to Admission and willing to participate: Yes Program Orientation Provided & Reviewed with Pt/Caregiver Including Roles  & Responsibilities: Yes  Decrease burden of Care through IP rehab admission: NA  Possible need for SNF placement upon discharge: Not anticipated  Patient Condition: I have reviewed medical records from [facility], spoken with CSW, and patient and family member. I met with patient at the bedside and discussed via phone for inpatient rehabilitation assessment.  Patient will benefit from ongoing PT, OT, and SLP, can actively participate in 3 hours of therapy a day 5 days of the week, and can make measurable gains during the admission.  Patient will also benefit from the coordinated team approach during an Inpatient Acute Rehabilitation admission.  The patient will receive intensive therapy as well as Rehabilitation physician, nursing, social worker, and care management interventions.  Due to bowel management, safety, disease management, medication administration, pain management, and patient education the patient requires 24 hour a day rehabilitation nursing.  The patient is currently *** with mobility and basic ADLs.  Discharge setting and therapy post discharge at home with home health is anticipated.  Patient has agreed to participate in the Acute Inpatient Rehabilitation Program and will admit {Time; today/tomorrow:10263}.  Preadmission Screen Completed By:  Bethel Born, 03/06/2022 10:12  AM ______________________________________________________________________   Discussed status with Dr. Marland Kitchen on *** at *** and received approval for admission today.  Admission Coordinator:  Bethel Born, CCC-SLP, time ***/Date ***   Assessment/Plan: Diagnosis: Does the need for close, 24 hr/day Medical supervision in concert with the patient's rehab needs make it unreasonable for this patient to be served in a less intensive setting? {yes_no_potentially:3041433} Co-Morbidities requiring supervision/potential complications: *** Due to {due HO:1224825}, does the patient require 24 hr/day rehab nursing? {yes_no_potentially:3041433} Does the patient require coordinated care of a physician, rehab nurse, PT, OT, and SLP to address physical and functional deficits in the context of the above medical diagnosis(es)? {yes_no_potentially:3041433} Addressing deficits in the following areas: {deficits:3041436} Can the patient actively participate in an intensive therapy program of at least 3 hrs of therapy 5 days a week? {yes_no_potentially:3041433} The potential for patient to make measurable gains while on inpatient rehab is {potential:3041437} Anticipated functional outcomes upon discharge from inpatient rehab: {functional outcomes:304600100} PT, {functional outcomes:304600100} OT, {functional outcomes:304600100} SLP Estimated rehab length of stay to reach the above functional goals is: *** Anticipated discharge destination: {anticipated dc setting:21604} 10. Overall Rehab/Functional Prognosis: {potential:3041437}   MD Signature: ***

## 2022-03-06 NOTE — Progress Notes (Signed)
PROGRESS NOTE    Timothy Hubbard  PPI:[id number] DOB: 1990/11/06 DOA: 03/01/2022 PCP: Alcus Dad, MD   Brief Narrative:  [name] is a 31 yo man with a hx of HTN, asthma, GERD, ETOH use, here after multiple seizure like episodes and AMS.  He was previously noted to be normal per family, then came out of the bathroom stumbling and fell, without notable trauma. Then had teeth grinding and apparent seizure activity for 7 min.  Was altered, lethargic and confused afterwards. Additional seizure about 1 hour later, and another 1 hr later.  Brought to the ED following the third episode.    Typically non compliant with medications intermittently at baseline - no current active home medications listed on med reconciliation. Per his mother, he has been acting and feeling normal as far as she knows (he doesn't complain to her much and keeps his medical issues private from her).  Some R side pain recently. No hx of etoh withdrawal.    6/28 admitted to ICU with new onset sz activity, LTM, protecting his airway 6/29 no seizure on LTM, head imaging negative, still dysarthric but improving 6/30 Stable transfer to medical floor - requesting IV narcotics (previously on in ICU) which we discussed were not going to be replaced for his chronic lumbago. Will continue OTC medications PRN. 7/1 Fall overnight trying to get out of bed without calling for help - generally being noncompliant. Pulling out IV multiple times - requiring blood transfusion this am.  7/2 -hemoglobin improving, worsening mental status over the last 24 hours, noncompliant today with medications, attempting to remove IV, somewhat combative with staff, restraints were placed  Assessment & Plan:   Principal Problem:   Encephalopathy acute Active Problems:   TOBACCO USER   Essential hypertension   Asthma in adult, moderate persistent, with acute exacerbation   Gastroesophageal reflux disease   Alcohol use disorder   Fever in adult    Seizure ([place])   Hypokalemia   Iron deficiency anemia   New onset seizure activity, ? Provoked secondary to etoh decrease Without signs/symptoms of withdrawals currently. Chronic ETOH use /abuse Acute Encephalopathy -Etoh intake cutdown recently (unclear if drastic cut or cessation) -regardless he is outside the window for withdrawals at this point -Low suspicion meningitis based on CSF results, procal low -CTA and MRI brain unremarkable - Neuro following - continue keppra - EEG unremarkable - UDS positive for cocaine and cannabinoids -polypharmacy likely playing a role in his mental status and behavior -Mental status fluctuating, somewhat combative with staff today restraints were placed -May benefit from psych evaluation if mental status continues to be labile over the next 24 hours. -TSH WNL - Continue thiamine, folic acid and MV supplementation    Ambulatory dysfunction  -Multifactorial, secondary to above, continue to follow with PT OT -Remains unsafe disposition home at this time given weakness and unclear support at home  Dysphagia -Likely secondary to acute siezure, follows with speech  Hypokalemia Hypomagnesemia In the setting of chronic alcohol abuse/malnutrition K 2.8, Mag 1.9 -Replete as appropriate   Iron deficiency anemia Rule out acute blood loss anemia Hgb drifting down, 8.7->8.0->7.3-->6.3 --> 9.4(post 2u transfusion) Fe stores low, possibly secondary to malnutrition Occult card pending 2 unit PRBC 7/1  HTN -Blood pressure increasing -No listed home medications, will start low-dose amlodipine and follow clinically -Currently well controlled   GERD -continue protonix.    Asthma, tobacco use -prn nebs, not currently wheezing  DVT prophylaxis: Lovenox Code Status: Full Family Communication:  None present at the time of interview  Status is: Inpt  Dispo: The patient is from: Home              Anticipated d/c is to: Home              Anticipated d/c  date is: 24-48h              Patient currently NOT medically stable for discharge  Consultants:  PCCM, Neuro  Procedures:  None  Antimicrobials:  None   Subjective: Mechanical fall overnight while attempting to ambulate without assistance.  Denies nausea vomiting diarrhea constipation headache fevers chills chest pain.  Hemoglobin downtrending overnight as well but remains asymptomatic.  Objective: Vitals:   03/05/22 2000 03/06/22 0009 03/06/22 0500 03/06/22 0738  BP: (!) 158/102 (!) 145/99 135/89 (!) 147/115  Pulse: 85 96 95 100  Resp: '18 18 18 16  '$ Temp: 98.5 F (36.9 C) 98.9 F (37.2 C) 98.4 F (36.9 C) 98.4 F (36.9 C)  TempSrc: Oral Oral Oral Oral  SpO2:  100% 100% 100%  Weight:      Height:        Intake/Output Summary (Last 24 hours) at 03/06/2022 0753 Last data filed at 03/05/2022 2000 Gross per 24 hour  Intake 659 ml  Output --  Net 659 ml    Filed Weights   03/02/22 0131 03/04/22 0524 03/05/22 0500  Weight: 55 kg 61.3 kg 61.7 kg    Examination:  General exam: Appears calm and comfortable  Respiratory system: Clear to auscultation. Respiratory effort normal. Cardiovascular system: S1 & S2 heard, RRR. No JVD, murmurs, rubs, gallops or clicks. No pedal edema. Gastrointestinal system: Abdomen is nondistended, soft and nontender. No organomegaly or masses felt. Normal bowel sounds heard. Central nervous system: Alert and oriented. No focal neurological deficits. Extremities: Symmetric 5 x 5 power. Skin: No rashes, lesions or ulcers Psychiatry: Poor judgement and insight, mood & affect labile   Data Reviewed: I have personally reviewed following labs and imaging studies  CBC: Recent Labs  Lab 03/01/22 2133 03/02/22 0640 03/03/22 0842 03/04/22 0901 03/04/22 1105 03/05/22 0358 03/05/22 0645 03/06/22 0310  WBC 12.8* 9.0 5.9 4.7  --  5.6  --  8.7  NEUTROABS 9.5* 7.2  --   --   --   --   --   --   HGB 8.7* 8.0* 7.3* 6.5* 7.4* 6.5* 6.3* 9.4*  HCT 30.9*  26.8* 25.2* 22.7* 25.4* 21.7* 21.4* 29.1*  MCV 80.3 78.6* 79.2* 79.1*  --  78.6*  --  78.9*  PLT 305 243 213 179  --  175  --  160    Basic Metabolic Panel: Recent Labs  Lab 03/01/22 2121 03/02/22 0640 03/03/22 0842 03/04/22 0901 03/05/22 0358 03/06/22 0310  NA 141 138 140 140 142 141  K 3.9 3.1* 2.8* 3.2* 3.1* 3.3*  CL 103 103 105 111 111 114*  CO2 12* 19* 23 22 19* 19*  GLUCOSE 119* 126* 101* 110* 100* 89  BUN 10 5* 7 5* <5* <5*  CREATININE 1.29* 0.83 0.70 0.67 0.66 0.68  CALCIUM 10.0 8.8* 8.4* 8.1* 8.2* 8.4*  MG 2.0 1.7 1.9  --   --   --   PHOS  --   --  3.1  --   --   --     GFR: Estimated Creatinine Clearance: 111 mL/min (by C-G formula based on SCr of 0.68 mg/dL). Liver Function Tests: Recent Labs  Lab 03/01/22 2121  AST 57*  ALT <5  ALKPHOS 64  BILITOT 1.3*  PROT 7.2  ALBUMIN 4.1    No results for input(s): "LIPASE", "AMYLASE" in the last 168 hours. Recent Labs  Lab 03/02/22 0640  AMMONIA 19    Coagulation Profile: Recent Labs  Lab 03/01/22 2133  INR 0.9    Cardiac Enzymes: No results for input(s): "CKTOTAL", "CKMB", "CKMBINDEX", "TROPONINI" in the last 168 hours. BNP (last 3 results) No results for input(s): "PROBNP" in the last 8760 hours. HbA1C: Recent Labs    03/03/22 0842  HGBA1C <4.2*    CBG: Recent Labs  Lab 03/03/22 0717 03/03/22 1209 03/03/22 1547 03/04/22 0839 03/04/22 1156  GLUCAP 104* 103* 105* 115* 145*    Lipid Profile: No results for input(s): "CHOL", "HDL", "LDLCALC", "TRIG", "CHOLHDL", "LDLDIRECT" in the last 72 hours. Thyroid Function Tests: No results for input(s): "TSH", "T4TOTAL", "FREET4", "T3FREE", "THYROIDAB" in the last 72 hours.  Anemia Panel: Recent Labs    03/03/22 0842 03/05/22 0645  VITAMINB12  --  338  FOLATE  --  8.6  FERRITIN 43  --   TIBC 300  --   IRON 13*  --     Sepsis Labs: Recent Labs  Lab 03/01/22 0038 03/01/22 2122 03/02/22 0030 03/02/22 0640  PROCALCITON  --   --  0.12   --   LATICACIDVEN 7.5* >9.0*  --  3.2*     Recent Results (from the past 240 hour(s))  Resp Panel by RT-PCR (Flu A&B, Covid) Anterior Nasal Swab     Status: None   Collection Time: 03/01/22 12:48 AM   Specimen: Anterior Nasal Swab  Result Value Ref Range Status   SARS Coronavirus 2 by RT PCR NEGATIVE NEGATIVE Final    Comment: (NOTE) SARS-CoV-2 target nucleic acids are NOT DETECTED.  The SARS-CoV-2 RNA is generally detectable in upper respiratory specimens during the acute phase of infection. The lowest concentration of SARS-CoV-2 viral copies this assay can detect is 138 copies/mL. A negative result does not preclude SARS-Cov-2 infection and should not be used as the sole basis for treatment or other patient management decisions. A negative result may occur with  improper specimen collection/handling, submission of specimen other than nasopharyngeal swab, presence of viral mutation(s) within the areas targeted by this assay, and inadequate number of viral copies(<138 copies/mL). A negative result must be combined with clinical observations, patient history, and epidemiological information. The expected result is Negative.  Fact Sheet for Patients:  EntrepreneurPulse.com.au  Fact Sheet for Healthcare Providers:  IncredibleEmployment.be  This test is no t yet approved or cleared by the [place] FDA and  has been authorized for detection and/or diagnosis of SARS-CoV-2 by FDA under an Emergency Use Authorization (EUA). This EUA will remain  in effect (meaning this test can be used) for the duration of the COVID-19 declaration under Section 564(b)(1) of the Act, 21 U.S.C.section 360bbb-3(b)(1), unless the authorization is terminated  or revoked sooner.       Influenza A by PCR NEGATIVE NEGATIVE Final   Influenza B by PCR NEGATIVE NEGATIVE Final    Comment: (NOTE) The Xpert Xpress SARS-CoV-2/FLU/RSV plus assay is intended as an aid in  the diagnosis of influenza from Nasopharyngeal swab specimens and should not be used as a sole basis for treatment. Nasal washings and aspirates are unacceptable for Xpert Xpress SARS-CoV-2/FLU/RSV testing.  Fact Sheet for Patients: EntrepreneurPulse.com.au  Fact Sheet for Healthcare Providers: IncredibleEmployment.be  This test is not yet approved or cleared by the [place]  States FDA and has been authorized for detection and/or diagnosis of SARS-CoV-2 by FDA under an Emergency Use Authorization (EUA). This EUA will remain in effect (meaning this test can be used) for the duration of the COVID-19 declaration under Section 564(b)(1) of the Act, 21 U.S.C. section 360bbb-3(b)(1), unless the authorization is terminated or revoked.  Performed at [facility], [place] [address], [place], [place] [address]   Blood Culture (routine x 2)     Status: None (Preliminary result)   Collection Time: 03/01/22  9:54 PM   Specimen: BLOOD  Result Value Ref Range Status   Specimen Description   Final    BLOOD RIGHT ANTECUBITAL Performed at [facility] [address], [place], [place] [address]    Special Requests   Final    BOTTLES DRAWN AEROBIC AND ANAEROBIC Blood Culture adequate volume Performed at [facility] [address], [place], [place] [address]    Culture   Final    NO GROWTH 4 DAYS Performed at [facility], [place] [address] [place], [place] [address]    Report Status PENDING  Incomplete  Blood Culture (routine x 2)     Status: None (Preliminary result)   Collection Time: 03/01/22  9:57 PM   Specimen: BLOOD  Result Value Ref Range Status   Specimen Description   Final    BLOOD LEFT ANTECUBITAL Performed at [facility] [address], [place], [place] [address]    Special Requests   Final    BOTTLES DRAWN AEROBIC AND ANAEROBIC Blood Culture adequate  volume Performed at [facility] [address] [place], [place] [address]    Culture  Setup Time   Final    GRAM POSITIVE COCCI IN CLUSTERS AEROBIC BOTTLE ONLY Organism ID to follow CRITICAL RESULT CALLED TO, READ BACK BY AND VERIFIED WITH:  C/ PHARMD ELIZABETH M. 03/04/22 1115 A. LAFRANCE Performed at [facility], [place] [address] [place], [place] [address]    Culture GRAM POSITIVE COCCI  Final   Report Status PENDING  Incomplete  Blood Culture ID Panel (Reflexed)     Status: Abnormal   Collection Time: 03/01/22  9:57 PM  Result Value Ref Range Status   Enterococcus faecalis NOT DETECTED NOT DETECTED Final   Enterococcus Faecium NOT DETECTED NOT DETECTED Final   Listeria monocytogenes NOT DETECTED NOT DETECTED Final   Staphylococcus species DETECTED (A) NOT DETECTED Final    Comment: CRITICAL RESULT CALLED TO, READ BACK BY AND VERIFIED WITH:  C/ PHARMD ELIZABETH M. 03/04/22 1115 A. LAFRANCE    Staphylococcus aureus (BCID) NOT DETECTED NOT DETECTED Final   Staphylococcus epidermidis NOT DETECTED NOT DETECTED Final   Staphylococcus lugdunensis NOT DETECTED NOT DETECTED Final   Streptococcus species NOT DETECTED NOT DETECTED Final   Streptococcus agalactiae NOT DETECTED NOT DETECTED Final   Streptococcus pneumoniae NOT DETECTED NOT DETECTED Final   Streptococcus pyogenes NOT DETECTED NOT DETECTED Final   A.calcoaceticus-baumannii NOT DETECTED NOT DETECTED Final   Bacteroides fragilis NOT DETECTED NOT DETECTED Final   Enterobacterales NOT DETECTED NOT DETECTED Final   Enterobacter cloacae complex NOT DETECTED NOT DETECTED Final   Escherichia coli NOT DETECTED NOT DETECTED Final   Klebsiella aerogenes NOT DETECTED NOT DETECTED Final   Klebsiella oxytoca NOT DETECTED NOT DETECTED Final   Klebsiella pneumoniae NOT DETECTED NOT DETECTED Final   Proteus species NOT DETECTED NOT DETECTED Final   Salmonella species NOT DETECTED NOT DETECTED  Final   Serratia  marcescens NOT DETECTED NOT DETECTED Final   Haemophilus influenzae NOT DETECTED NOT DETECTED Final   Neisseria meningitidis NOT DETECTED NOT DETECTED Final   Pseudomonas aeruginosa NOT DETECTED NOT DETECTED Final   Stenotrophomonas maltophilia NOT DETECTED NOT DETECTED Final   Candida albicans NOT DETECTED NOT DETECTED Final   Candida auris NOT DETECTED NOT DETECTED Final   Candida glabrata NOT DETECTED NOT DETECTED Final   Candida krusei NOT DETECTED NOT DETECTED Final   Candida parapsilosis NOT DETECTED NOT DETECTED Final   Candida tropicalis NOT DETECTED NOT DETECTED Final   Cryptococcus neoformans/gattii NOT DETECTED NOT DETECTED Final    Comment: Performed at [facility], [place] [address] [place], [place] [address]  CSF culture     Status: None   Collection Time: 03/01/22 11:16 PM   Specimen: Back; Cerebrospinal Fluid  Result Value Ref Range Status   Specimen Description   Final    BACK Performed at [facility] [address], [place], [place] [address]    Special Requests   Final    NONE Performed at [facility], [place] [address] [place], [place] [address]    Gram Stain   Final    NO WBC SEEN NO ORGANISMS SEEN CYTOSPIN SMEAR Gram Stain Report Called to,Read Back By and Verified With: CALLED TO KELSEY,A ON [id number] AT [place]    Culture   Final    NO GROWTH 3 DAYS Performed at [facility], [place] [address], [place], [place] [address]    Report Status 03/05/2022 FINAL  Final  Urine Culture     Status: None   Collection Time: 03/02/22  1:24 AM   Specimen: In/Out Cath Urine  Result Value Ref Range Status   Specimen Description IN/OUT CATH URINE  Final   Special Requests NONE  Final   Culture   Final    NO GROWTH Performed at [facility], [place] [address], [place], [place] [address]    Report Status 03/03/2022 FINAL  Final  MRSA Next Gen by PCR, Nasal     Status: None   Collection Time: 03/02/22  1:33 AM    Specimen: Nasal Mucosa; Nasal Swab  Result Value Ref Range Status   MRSA by PCR Next Gen NOT DETECTED NOT DETECTED Final    Comment: (NOTE) The GeneXpert MRSA Assay (FDA approved for NASAL specimens only), is one component of a comprehensive MRSA colonization surveillance program. It is not intended to diagnose MRSA infection nor to guide or monitor treatment for MRSA infections. Test performance is not FDA approved in patients less than 39 years old. Performed at [facility], [place] [address], [place], [place] [address]          Radiology Studies: No results found.  Scheduled Meds:  Chlorhexidine Gluconate Cloth  6 each Topical P2330   folic acid  1 mg Intravenous Daily   mometasone-formoterol  2 puff Inhalation BID   multivitamin with minerals  1 tablet Oral Daily   mouth rinse  15 mL Mouth Rinse 4 times per day   mouth rinse  15 mL Mouth Rinse 4 times per day   pantoprazole (PROTONIX) IV  40 mg Intravenous Q24H   thiamine injection  100 mg Intravenous Daily   Continuous Infusions:  dextrose 5 % and 0.9% NaCl 75 mL/hr at 03/04/22 1600   levETIRAcetam 500 mg (03/06/22 0742)     LOS: 5 days  Time spent: 43mn  Lynsay Fesperman C Biff Rutigliano, DO [facility]  If 7PM-7AM, please contact night-coverage [email or web address]  03/06/2022, 7:53 AM

## 2022-03-06 NOTE — Progress Notes (Signed)
Speech Language Pathology Treatment: Dysphagia  Patient Details Name: Timothy Hubbard MRN: [id number] DOB: 1990/09/27 Today's Date: 03/06/2022 Time: 9211-9417 SLP Time Calculation (min) (ACUTE ONLY): 15 min  Assessment / Plan / Recommendation Clinical Impression  F/u for swallowing.  Mr. Cutting is doing better with swallowing. He demonstrates improved oral control of solids and liquids, with minimal oral residue post swallow.  He is coughing less frequently with trials of thin liquids, but s/s of aspiration with thin liquids are still present.  He will likely benefit from an MBS before advancing liquids. Speech remains significantly dysarthric without improvement since last seen by SLP on 6/30.  Baseline communication is unknown.  Called his mother this am to garner more information about Sylvanus's baseline but there was no answer.    Will continue to follow for swallowing and will advance diet to dysphagia 3, nectars.   HPI HPI: Timothy Hubbard is a 31 yo man with a hx of HTN, asthma, GERD, ETOH use, here after multiple seizure like episodes and AMS.  Pt not intubated. Failed [place]. MRI negative.      SLP Plan  Continue with current plan of care      Recommendations for follow up therapy are one component of a multi-disciplinary discharge planning process, led by the attending physician.  Recommendations may be updated based on patient status, additional functional criteria and insurance authorization.    Recommendations  Diet recommendations: Dysphagia 3 (mechanical soft);Nectar-thick liquid Liquids provided via: Cup;Straw Medication Administration: Whole meds with puree Supervision: Staff to assist with self feeding Compensations: Slow rate Postural Changes and/or Swallow Maneuvers: Seated upright 90 degrees                Oral Care Recommendations: Oral care BID Follow Up Recommendations: Acute inpatient rehab (3hours/day) SLP Visit Diagnosis: Dysphagia, oropharyngeal phase  (R13.12) Plan: Continue with current plan of care        Chala Gul L. Tivis Ringer, MA CCC/SLP Clinical Specialist - Acute Care SLP Acute Rehabilitation Services Office number [phone]    [name]  03/06/2022, 11:15 AM

## 2022-03-07 DIAGNOSIS — G9340 Encephalopathy, unspecified: Secondary | ICD-10-CM | POA: Diagnosis not present

## 2022-03-07 LAB — CULTURE, BLOOD (ROUTINE X 2)
Culture: NO GROWTH
Special Requests: ADEQUATE

## 2022-03-07 LAB — CBC
HCT: 31 % — ABNORMAL LOW (ref 39.0–52.0)
Hemoglobin: 9.3 g/dL — ABNORMAL LOW (ref 13.0–17.0)
MCH: 23.9 pg — ABNORMAL LOW (ref 26.0–34.0)
MCHC: 30 g/dL (ref 30.0–36.0)
MCV: 79.7 fL — ABNORMAL LOW (ref 80.0–100.0)
Platelets: 195 10*3/uL (ref 150–400)
RBC: 3.89 MIL/uL — ABNORMAL LOW (ref 4.22–5.81)
RDW: 21.8 % — ABNORMAL HIGH (ref 11.5–15.5)
WBC: 9.3 10*3/uL (ref 4.0–10.5)
nRBC: 0 % (ref 0.0–0.2)

## 2022-03-07 LAB — BASIC METABOLIC PANEL
Anion gap: 8 (ref 5–15)
BUN: <5 mg/dL — ABNORMAL LOW (ref 6–20)
CO2: 20 mmol/L — ABNORMAL LOW (ref 22–32)
Calcium: 8.2 mg/dL — ABNORMAL LOW (ref 8.9–10.3)
Chloride: 114 mmol/L — ABNORMAL HIGH (ref 98–111)
Creatinine, Ser: 0.63 mg/dL (ref 0.61–1.24)
GFR, Estimated: >60 mL/min (ref >60)
Glucose, Bld: 85 mg/dL (ref 70–99)
Potassium: 3.1 mmol/L — ABNORMAL LOW (ref 3.5–5.1)
Sodium: 142 mmol/L (ref 135–145)

## 2022-03-07 MED ORDER — OLANZAPINE 2.5 MG PO TABS
5.0000 mg | ORAL_TABLET | Freq: Every day | ORAL | Status: DC
  Administered 2022-03-07 – 2022-03-09 (×3): 5 mg via ORAL
  Filled 2022-03-07 (×4): qty 2

## 2022-03-07 MED ORDER — SODIUM CHLORIDE 0.9 % IV SOLN
750.0000 mg | Freq: Two times a day (BID) | INTRAVENOUS | Status: DC
Start: 2022-03-07 — End: 2022-03-07
  Filled 2022-03-07 (×2): qty 7.5

## 2022-03-07 MED ORDER — CHLORHEXIDINE GLUCONATE CLOTH 2 % EX PADS
6.0000 | MEDICATED_PAD | Freq: Every day | CUTANEOUS | Status: DC
  Administered 2022-03-07 – 2022-03-10 (×4): 6 via TOPICAL

## 2022-03-07 MED ORDER — LEVETIRACETAM IN NACL 500 MG/100ML IV SOLN
500.0000 mg | Freq: Two times a day (BID) | INTRAVENOUS | Status: DC
  Administered 2022-03-07 – 2022-03-09 (×4): 500 mg via INTRAVENOUS
  Filled 2022-03-07 (×4): qty 100

## 2022-03-07 MED ORDER — POTASSIUM CHLORIDE 20 MEQ PO PACK
40.0000 meq | PACK | ORAL | Status: AC
  Administered 2022-03-07 (×3): 40 meq via ORAL
  Filled 2022-03-07 (×3): qty 2

## 2022-03-07 MED ORDER — PANTOPRAZOLE SODIUM 40 MG IV SOLR
40.0000 mg | INTRAVENOUS | Status: DC
Start: 2022-03-07 — End: 2022-03-10
  Administered 2022-03-07 – 2022-03-10 (×4): 40 mg via INTRAVENOUS
  Filled 2022-03-07 (×5): qty 10

## 2022-03-07 NOTE — Progress Notes (Signed)
Occupational Therapy Treatment Patient Details Name: Timothy Hubbard MRN: [id number] DOB: 08-24-1991 Today's Date: 03/07/2022   History of present illness Mr. Marney is a 31 yo man with a hx of HTN, asthma, GERD, ETOH use, here after multiple seizure like episodes and AMS.    He was normal, then came out of the bathroom stumbling and fell, no head trauma. Then had teeth grinding and apparent seizure activity for 7 min.  Was altered, lethargic and confised afterwards.  Additional seizure about 1 hour later, and another 1 hr later.  Brought to the ED following the third episode   OT comments  Pt progressing towards goals, completes UB/LB dressing, standing grooming task, and transfers with min-max A. Pt easily distracted, needing frequent redirection and firm step by step cuing to perform tasks, benefits from staying informed of what is happening during session, appears paranoid at times. Pt easily agitated, attempting to hit therapist when not assisting with LB dressing. Pt presenting with impairments listed below, will follow acutely. Continue to recommend AIR at d/c.    Recommendations for follow up therapy are one component of a multi-disciplinary discharge planning process, led by the attending physician.  Recommendations may be updated based on patient status, additional functional criteria and insurance authorization.    Follow Up Recommendations  Acute inpatient rehab (3hours/day)    Assistance Recommended at Discharge Frequent or constant Supervision/Assistance  Patient can return home with the following  A lot of help with walking and/or transfers;A lot of help with bathing/dressing/bathroom;Direct supervision/assist for medications management;Assistance with cooking/housework;Assistance with feeding;Direct supervision/assist for financial management;Assist for transportation;Help with stairs or ramp for entrance   Equipment Recommendations  None recommended by OT;Other (comment) (defer to  next venue of care)    Recommendations for Other Services Rehab consult    Precautions / Restrictions Precautions Precautions: Fall;Other (comment) Precaution Comments: seizure Restrictions Weight Bearing Restrictions: No       Mobility Bed Mobility Overal bed mobility: Needs Assistance Bed Mobility: Supine to Sit     Supine to sit: Min assist     General bed mobility comments: needing decreased distractions in room, therapist providing step by step cues of tasks during session, pt appearing paranoid    Transfers Overall transfer level: Needs assistance Equipment used: 2 person hand held assist Transfers: Sit to/from Stand, Bed to chair/wheelchair/BSC Sit to Stand: Min assist, +2 physical assistance                 Balance Overall balance assessment: Needs assistance Sitting-balance support: Bilateral upper extremity supported, Feet supported Sitting balance-Leahy Scale: Fair   Postural control: Posterior lean Standing balance support: Bilateral upper extremity supported Standing balance-Leahy Scale: Poor                             ADL either performed or assessed with clinical judgement   ADL Overall ADL's : Needs assistance/impaired     Grooming: Minimal assistance;Standing;Oral care           Upper Body Dressing : Minimal assistance;Bed level Upper Body Dressing Details (indicate cue type and reason): to don gown Lower Body Dressing: Moderate assistance;Bed level Lower Body Dressing Details (indicate cue type and reason): to don socks Toilet Transfer: Minimal assistance;+2 for safety/equipment;Regular Toilet;Ambulation Toilet Transfer Details (indicate cue type and reason): simulated in room Toileting- Clothing Manipulation and Hygiene: Maximal assistance;Sit to/from stand Toileting - Clothing Manipulation Details (indicate cue type and reason): for pericare  General ADL Comments: impulsivity, and easily agitated     Extremity/Trunk Assessment Upper Extremity Assessment Upper Extremity Assessment: Generalized weakness   Lower Extremity Assessment Lower Extremity Assessment: Defer to PT evaluation        Vision   Vision Assessment?: No apparent visual deficits   Perception Perception Perception: Not tested   Praxis Praxis Praxis: Not tested    Cognition Arousal/Alertness: Awake/alert Behavior During Therapy: Impulsive Overall Cognitive Status: Difficult to assess                                 General Comments: Decreased safety awareness and deficit awareness; states he is in [place], but tangential and needs assist to redirect        Exercises      Shoulder Instructions       General Comments VSS on RA, in bilateral wrist restraints    Pertinent Vitals/ Pain       Pain Assessment Pain Assessment: No/denies pain  Home Living                                          Prior Functioning/Environment              Frequency  Min 2X/week        Progress Toward Goals  OT Goals(current goals can now be found in the care plan section)  Progress towards OT goals: Progressing toward goals  Acute Rehab OT Goals Patient Stated Goal: to eat OT Goal Formulation: With patient Time For Goal Achievement: 03/18/22 Potential to Achieve Goals: Good ADL Goals Pt Will Perform Eating: with modified independence;with adaptive utensils;sitting Pt Will Perform Grooming: with modified independence;with adaptive equipment;sitting Pt Will Perform Lower Body Bathing: with modified independence;sitting/lateral leans;sit to/from stand Pt Will Perform Lower Body Dressing: with modified independence;sitting/lateral leans;sit to/from stand Pt Will Transfer to Toilet: with modified independence;ambulating Pt Will Perform Toileting - Clothing Manipulation and hygiene: with modified independence;sitting/lateral leans;sit to/from stand  Plan Discharge plan  remains appropriate;Frequency remains appropriate    Co-evaluation    PT/OT/SLP Co-Evaluation/Treatment: Yes Reason for Co-Treatment: For patient/therapist safety;To address functional/ADL transfers   OT goals addressed during session: ADL's and self-care      AM-PAC OT "6 Clicks" Daily Activity     Outcome Measure   Help from another person eating meals?: A Little Help from another person taking care of personal grooming?: A Little Help from another person toileting, which includes using toliet, bedpan, or urinal?: A Lot Help from another person bathing (including washing, rinsing, drying)?: A Lot Help from another person to put on and taking off regular upper body clothing?: A Little Help from another person to put on and taking off regular lower body clothing?: A Lot 6 Click Score: 15    End of Session Equipment Utilized During Treatment: Gait belt  OT Visit Diagnosis: Unsteadiness on feet (R26.81);Other abnormalities of gait and mobility (R26.89);Muscle weakness (generalized) (M62.81);Other symptoms and signs involving the nervous system (R29.898)   Activity Tolerance Patient tolerated treatment well   Patient Left in bed;with call bell/phone within reach;with bed alarm set   Nurse Communication Mobility status        Time: 6834-1962 OT Time Calculation (min): 27 min  Charges: OT General Charges $OT Visit: 1 Visit OT Treatments $Self Care/Home Management : 8-22 mins  Lanelle Bal  Taleyah Hillman, [place], OTR/L Acute Rehab [phone] - [place] 03/07/2022, 11:22 AM

## 2022-03-07 NOTE — Progress Notes (Signed)
Inpatient Rehab Admissions Coordinator:  Saw pt at bedside. Informed him that awaiting medical clearance and insurance authorization. Will continue to follow.   Gayland Curry, [place], [place] Admissions Coordinator [phone]

## 2022-03-07 NOTE — Progress Notes (Addendum)
Called to bedside and pts mother reports pt had a seizure with an "upward gaze and shaking". Upon arrival pt is sitting up in bed and was at his previous mentation baseline and vitals were at baseline. '2mg'$  versed given per order. MD notified.  18:32 called to bedside again that pt had a witness seizure by mother. Pt is sitting up in bed in no apparent distress, vitals and orientation are stable. MD notified

## 2022-03-07 NOTE — Progress Notes (Signed)
Brief Neuro Update:  Was notified of brief seizure like episodes today. He has been getting Keppra '500mg'$  BID. Per RN, no seizure seen overnight or during the shift. However, patient's mom reported seeing seizure x 2 in an hour.  1st episode: Upward gaze and R arm shaking 2nd episod: Eyes closed and moving his R Arm up and down.   These were brief and last 10-15 secs with no post ictal period.   Some concern about these episodes being behavioral in nature.  Recs: - Will put him up on LTM at this time rather than increase his Keppra.  Plan discussed with Hospitalist team over phone.  [place] Pager Number [phone]

## 2022-03-07 NOTE — Progress Notes (Signed)
Physical Therapy Treatment Patient Details Name: Timothy Hubbard MRN: [id number] DOB: 1991/06/10 Today's Date: 03/07/2022   History of Present Illness Mr. Bracken is a 31 yo man with a hx of HTN, asthma, GERD, ETOH use, here after multiple seizure like episodes and AMS.    He was normal, then came out of the bathroom stumbling and fell, no head trauma. Then had teeth grinding and apparent seizure activity for 7 min.  Was altered, lethargic and confised afterwards.  Additional seizure about 1 hour later, and another 1 hr later.  Brought to the ED following the third episode    PT Comments    Pt remains to have severe cognitive and functional deficits. Pt impulsive with combative tendencies but responds well to firm simple commands and being informed about what's happening before therapy assists him. Pt demo's impaired co-ordination of bilat UEs L>R and LEs during functional task ie. Putting on socks and brushing teeth. Pt with 2 episodes of trying to swing at therapists however was able to be re-directed with firm simple commands. Pt apologized after second swing attempt and didn't attempt again t/o remainder of session. Pt with hard time focusing on therapist with eyes as well. Pt with unintelligible speech majority of time. Pt ambulated in room via bilat HHA to keep pt safe and provide tactile directional cues. Acute PT to cont to follow.   Recommendations for follow up therapy are one component of a multi-disciplinary discharge planning process, led by the attending physician.  Recommendations may be updated based on patient status, additional functional criteria and insurance authorization.  Follow Up Recommendations  Acute inpatient rehab (3hours/day)     Assistance Recommended at Discharge Frequent or constant Supervision/Assistance  Patient can return home with the following A lot of help with walking and/or transfers;A lot of help with bathing/dressing/bathroom;Assist for transportation;Help  with stairs or ramp for entrance;Assistance with cooking/housework;Direct supervision/assist for medications management   Equipment Recommendations   (TBD at next venue)    Recommendations for Other Services Rehab consult     Precautions / Restrictions Precautions Precautions: Fall;Other (comment) Precaution Comments: seizure Restrictions Weight Bearing Restrictions: No     Mobility  Bed Mobility Overal bed mobility: Needs Assistance Bed Mobility: Supine to Sit     Supine to sit: HOB elevated, +2 for safety/equipment, Min assist     General bed mobility comments: impulsive with mobility, poor safety awareness, max directional verbal and tactile cues, PT and OT to hold bilat hands to prevent swinging while providing tactile cues to initiate and guide pt to EOB    Transfers Overall transfer level: Needs assistance Equipment used: 2 person hand held assist (2nd person for IV line) Transfers: Sit to/from Stand, Bed to chair/wheelchair/BSC Sit to Stand: Min assist, +2 safety/equipment           General transfer comment: with direct, firm step by step cues pt stood with minA for safety without retropulsion this date    Ambulation/Gait Ambulation/Gait assistance: Min assist, +2 physical assistance, +2 safety/equipment Gait Distance (Feet): 10 Feet (x3) Assistive device: 2 person hand held assist (on L) Gait Pattern/deviations: Step-through pattern, Decreased dorsiflexion - right, Staggering left, Staggering right, Ataxic, Wide base of support Gait velocity: dec     General Gait Details: pt with gross instability with significant lateral sway, pt wtih sequenctial stepping pattern when guided by PT and OT via bilat HHA, keeping pt's hands occupied minimized pt's ability to swing or become agitated   Stairs  Wheelchair Mobility    Modified Rankin (Stroke Patients Only)       Balance Overall balance assessment: Needs assistance Sitting-balance  support: Bilateral upper extremity supported, Feet supported Sitting balance-Leahy Scale: Fair Sitting balance - Comments: pt very impulsive requiring close min guard to minimize fall risk due to quick movement Postural control: Posterior lean Standing balance support: Single extremity supported Standing balance-Leahy Scale: Poor Standing balance comment: pt minA for stability while brushing teeth at sink with OT                            Cognition Arousal/Alertness: Awake/alert Behavior During Therapy: Impulsive Overall Cognitive Status: Difficult to assess                                 General Comments: Decreased safety awareness and deficit awareness, pt requiring maximal direct verbal cues to minimize pt becoming agitated and start swinging against therapy. Pt inappropriate trying to kiss PT, OT, and tech. Pt had BM in bed, no awareness. Pt needs to be told what is going to happen to he knows what to expect. Pt with poor sequencing requiring step by step verbal cues ie. putting on socks, brushing teeth. Pt with no safety awareness and uninteligible speech        Exercises      General Comments General comments (skin integrity, edema, etc.): VSS on RA, in bilateral wrist restraints      Pertinent Vitals/Pain Pain Assessment Pain Assessment: Faces Faces Pain Scale: No hurt    Home Living                          Prior Function            PT Goals (current goals can now be found in the care plan section) Acute Rehab PT Goals Patient Stated Goal: none stated PT Goal Formulation: With patient Time For Goal Achievement: 03/18/22 Potential to Achieve Goals: Good Progress towards PT goals: Progressing toward goals    Frequency    Min 3X/week      PT Plan Current plan remains appropriate    Co-evaluation PT/OT/SLP Co-Evaluation/Treatment: Yes Reason for Co-Treatment: For patient/therapist safety;To address functional/ADL  transfers   OT goals addressed during session: ADL's and self-care      AM-PAC PT "6 Clicks" Mobility   Outcome Measure  Help needed turning from your back to your side while in a flat bed without using bedrails?: A Little Help needed moving from lying on your back to sitting on the side of a flat bed without using bedrails?: A Little Help needed moving to and from a bed to a chair (including a wheelchair)?: A Lot Help needed standing up from a chair using your arms (e.g., wheelchair or bedside chair)?: A Lot Help needed to walk in hospital room?: A Lot Help needed climbing 3-5 steps with a railing? : A Lot 6 Click Score: 14    End of Session Equipment Utilized During Treatment: Gait belt Activity Tolerance: Patient tolerated treatment well Patient left: with call bell/phone within reach;in chair;with chair alarm set;with family/visitor present Nurse Communication: Mobility status PT Visit Diagnosis: Unsteadiness on feet (R26.81);Other abnormalities of gait and mobility (R26.89);Muscle weakness (generalized) (M62.81);Difficulty in walking, not elsewhere classified (R26.2)     Time: 7782-4235 PT Time Calculation (min) (ACUTE ONLY): 28 min  Charges:  $  Neuromuscular Re-education: 8-22 mins                     Kittie Plater, PT, DPT Acute Rehabilitation Services Secure chat preferred Office #: [phone]    [place] Lopes 03/07/2022, 11:17 AM

## 2022-03-07 NOTE — Progress Notes (Addendum)
PROGRESS NOTE   [name]  PPJ:[id number] DOB: [birth year] DOA: 03/01/2022 PCP: Alcus Dad, MD   Brief Narrative:  [name] is a 31 yo man with a hx of HTN, asthma, GERD, ETOH use, here after multiple seizure like episodes and AMS.  He was previously noted to be normal per family, then came out of the bathroom stumbling and fell, without notable trauma. Then had teeth grinding and apparent seizure activity for 7 min.  Was altered, lethargic and confused afterwards. Additional seizure about 1 hour later, and another 1 hr later.  Brought to the ED following the third episode.    Typically non compliant with medications intermittently at baseline - no current active home medications listed on med reconciliation. Per his mother, he has been acting and feeling normal as far as she knows (he doesn't complain to her much and keeps his medical issues private from her).  Some R side pain recently. No hx of etoh withdrawal.    6/28 admitted to ICU with new onset sz activity, LTM, protecting his airway 6/29 no seizure on LTM, head imaging negative, still dysarthric but improving 6/30 Stable transfer to medical floor - requesting IV narcotics (previously on in ICU) which we discussed were not going to be replaced for his chronic lumbago. Will continue OTC medications PRN. 7/1 Fall overnight trying to get out of bed without calling for help - generally being noncompliant. Pulling out IV multiple times - requiring blood transfusion this am.  7/2 -hemoglobin improving, worsening mental status over the last 24 hours, noncompliant today with medications, attempting to remove IV, somewhat combative with staff, restraints were placed 7/3 Mental status remains labile - consulted Psych - start Zyprexa today, remains quite altered today without clear etiology **7/3 update - patient having atypical seizure-like-activity now that family is present. Initially a 'grandmal' appearing episode resolving prior to staff  arrival - midazolam administered. Second 'seizure' was patient simply slapping single arm against bed - upon staff arrival he was back to his previous baseline (although not back to usual baseline per mother this is his LOC over the past 72h).   Assessment & Plan:   Principal Problem:   Encephalopathy acute Active Problems:   TOBACCO USER   Essential hypertension   Asthma in adult, moderate persistent, with acute exacerbation   Gastroesophageal reflux disease   Alcohol use disorder   Fever in adult   Seizure (HCC)   Hypokalemia   Iron deficiency anemia  New onset seizure activity ? Provoked secondary to polypharmacy/etoh cessation Without signs/symptoms of withdrawals currently. Chronic ETOH use /abuse Acute Encephalopathy, ongoing -Etoh intake cutdown recently (unclear if drastic cut or cessation) -regardless he is outside the window for withdrawals at this point -Low suspicion meningitis based on CSF results, procal low -CTA and MRI brain unremarkable - Neuro following - continue keppra - EEG unremarkable - UDS positive for cocaine and cannabinoids(benzos likely given in the field during seizure) -polypharmacy likely playing a role in his mental status and behavior -Mental status fluctuating, somewhat combative with staff today restraints were placed -Start zyprexa - psych to follow along in consult - appreciate insight/recommendations -TSH WNL - Continue thiamine, folic acid and MV supplementation    Ambulatory dysfunction  -Multifactorial, secondary to above, continue to follow with PT OT -Remains unsafe disposition home at this time given weakness and unclear support at home with mother/father  Dysphagia -Likely secondary to acute siezure, follows with speech  Hypokalemia Hypomagnesemia In the setting of chronic alcohol  abuse/malnutrition -Replete as appropriate, follow am labs   Iron deficiency anemia Rule out acute blood loss anemia Hgb drifting down,  8.7->8.0->7.3-->6.3 --> 9.4(post 2u transfusion) Fe stores low, possibly secondary to malnutrition Occult card pending 2 unit PRBC 7/1  HTN -Blood pressure increasing -No listed home medications, will start low-dose amlodipine and follow clinically -Currently well controlled   GERD -continue protonix.    Asthma, tobacco use -prn nebs, not currently wheezing  Incorrect/mis-documented Bipolar/Schizophrenia  - Unable to find this documented diagnosis in the chart -Psych sidelined, they are unable to find any previous documentation or medications consistent with these diagnoses -Unclear why these are included in the chart but these will need to be corrected prior to discharge  DVT prophylaxis: Lovenox Code Status: Full Family Communication: Mother updated over phone  Status is: Inpt  Dispo: The patient is from: Home              Anticipated d/c is to: Home              Anticipated d/c date is: 48-72h              Patient currently NOT medically stable for discharge  Consultants:  PCCM, Neuro  Procedures:  None  Antimicrobials:  None   Subjective: No acute issues or events overnight, mental status continues to be quite labile, patient does not appear to understand consequences of his actions, continues to pull out IVs, attempts to get out of bed well unstable, today caught trying to empty his Foley bag into a cup as he thought it was full of honey.  Review of systems markedly limited but patient denies pain fevers chills or other complaints.  Objective: Vitals:   03/06/22 2149 03/06/22 2358 03/07/22 0401 03/07/22 0736  BP: (!) 145/102 (!) 153/103 (!) 156/103 (!) 140/106  Pulse:    71  Resp: '18 16 18 18  '$ Temp: 99.2 F (37.3 C) 98.4 F (36.9 C) 98.3 F (36.8 C) 98.9 F (37.2 C)  TempSrc: Oral Oral Oral Oral  SpO2: 100% 100% 95% 96%  Weight:      Height:        Intake/Output Summary (Last 24 hours) at 03/07/2022 0739 Last data filed at 03/07/2022 0414 Gross per 24  hour  Intake --  Output 2052 ml  Net -2052 ml    Filed Weights   03/02/22 0131 03/04/22 0524 03/05/22 0500  Weight: 55 kg 61.3 kg 61.7 kg    Examination:  General exam: Appears calm and comfortable  Respiratory system: Clear to auscultation. Respiratory effort normal. Cardiovascular system: S1 & S2 heard, RRR. No JVD, murmurs, rubs, gallops or clicks. No pedal edema. Gastrointestinal system: Abdomen is nondistended, soft and nontender. No organomegaly or masses felt. Normal bowel sounds heard. Central nervous system: Alert and oriented. No focal neurological deficits. Extremities: Symmetric 5 x 5 power. Skin: No rashes, lesions or ulcers Psychiatry: Poor judgement and insight, mood & affect labile   Data Reviewed: I have personally reviewed following labs and imaging studies  CBC: Recent Labs  Lab 03/01/22 2133 03/02/22 0640 03/03/22 0842 03/04/22 0901 03/04/22 1105 03/05/22 0358 03/05/22 0645 03/06/22 0310 03/07/22 0255  WBC 12.8* 9.0 5.9 4.7  --  5.6  --  8.7 9.3  NEUTROABS 9.5* 7.2  --   --   --   --   --   --   --   HGB 8.7* 8.0* 7.3* 6.5* 7.4* 6.5* 6.3* 9.4* 9.3*  HCT 30.9* 26.8* 25.2* 22.7*  25.4* 21.7* 21.4* 29.1* 31.0*  MCV 80.3 78.6* 79.2* 79.1*  --  78.6*  --  78.9* 79.7*  PLT 305 243 213 179  --  175  --  186 932    Basic Metabolic Panel: Recent Labs  Lab 03/01/22 2121 03/02/22 0640 03/03/22 0842 03/04/22 0901 03/05/22 0358 03/06/22 0310 03/07/22 0255  NA 141 138 140 140 142 141 142  K 3.9 3.1* 2.8* 3.2* 3.1* 3.3* 3.1*  CL 103 103 105 111 111 114* 114*  CO2 12* 19* 23 22 19* 19* 20*  GLUCOSE 119* 126* 101* 110* 100* 89 85  BUN 10 5* 7 5* <5* <5* <5*  CREATININE 1.29* 0.83 0.70 0.67 0.66 0.68 0.63  CALCIUM 10.0 8.8* 8.4* 8.1* 8.2* 8.4* 8.2*  MG 2.0 1.7 1.9  --   --   --   --   PHOS  --   --  3.1  --   --   --   --     GFR: Estimated Creatinine Clearance: 111 mL/min (by C-G formula based on SCr of 0.63 mg/dL). Liver Function Tests: Recent  Labs  Lab 03/01/22 2121  AST 57*  ALT <5  ALKPHOS 64  BILITOT 1.3*  PROT 7.2  ALBUMIN 4.1    No results for input(s): "LIPASE", "AMYLASE" in the last 168 hours. Recent Labs  Lab 03/02/22 0640  AMMONIA 19    Coagulation Profile: Recent Labs  Lab 03/01/22 2133  INR 0.9    Cardiac Enzymes: No results for input(s): "CKTOTAL", "CKMB", "CKMBINDEX", "TROPONINI" in the last 168 hours. BNP (last 3 results) No results for input(s): "PROBNP" in the last 8760 hours. HbA1C: No results for input(s): "HGBA1C" in the last 72 hours.  CBG: Recent Labs  Lab 03/03/22 0717 03/03/22 1209 03/03/22 1547 03/04/22 0839 03/04/22 1156  GLUCAP 104* 103* 105* 115* 145*    Lipid Profile: No results for input(s): "CHOL", "HDL", "LDLCALC", "TRIG", "CHOLHDL", "LDLDIRECT" in the last 72 hours. Thyroid Function Tests: No results for input(s): "TSH", "T4TOTAL", "FREET4", "T3FREE", "THYROIDAB" in the last 72 hours.  Anemia Panel: Recent Labs    03/05/22 0645  VITAMINB12 338  FOLATE 8.6    Sepsis Labs: Recent Labs  Lab 03/01/22 0038 03/01/22 2122 03/02/22 0030 03/02/22 0640  PROCALCITON  --   --  0.12  --   LATICACIDVEN 7.5* >9.0*  --  3.2*     Recent Results (from the past 240 hour(s))  Resp Panel by RT-PCR (Flu A&B, Covid) Anterior Nasal Swab     Status: None   Collection Time: 03/01/22 12:48 AM   Specimen: Anterior Nasal Swab  Result Value Ref Range Status   SARS Coronavirus 2 by RT PCR NEGATIVE NEGATIVE Final    Comment: (NOTE) SARS-CoV-2 target nucleic acids are NOT DETECTED.  The SARS-CoV-2 RNA is generally detectable in upper respiratory specimens during the acute phase of infection. The lowest concentration of SARS-CoV-2 viral copies this assay can detect is 138 copies/mL. A negative result does not preclude SARS-Cov-2 infection and should not be used as the sole basis for treatment or other patient management decisions. A negative result may occur with  improper  specimen collection/handling, submission of specimen other than nasopharyngeal swab, presence of viral mutation(s) within the areas targeted by this assay, and inadequate number of viral copies(<138 copies/mL). A negative result must be combined with clinical observations, patient history, and epidemiological information. The expected result is Negative.  Fact Sheet for Patients:  EntrepreneurPulse.com.au  Fact Sheet for Healthcare  Providers:  IncredibleEmployment.be  This test is no t yet approved or cleared by the [place] and  has been authorized for detection and/or diagnosis of SARS-CoV-2 by FDA under an Emergency Use Authorization (EUA). This EUA will remain  in effect (meaning this test can be used) for the duration of the COVID-19 declaration under Section 564(b)(1) of the Act, 21 U.S.C.section 360bbb-3(b)(1), unless the authorization is terminated  or revoked sooner.       Influenza A by PCR NEGATIVE NEGATIVE Final   Influenza B by PCR NEGATIVE NEGATIVE Final    Comment: (NOTE) The Xpert Xpress SARS-CoV-2/FLU/RSV plus assay is intended as an aid in the diagnosis of influenza from Nasopharyngeal swab specimens and should not be used as a sole basis for treatment. Nasal washings and aspirates are unacceptable for Xpert Xpress SARS-CoV-2/FLU/RSV testing.  Fact Sheet for Patients: EntrepreneurPulse.com.au  Fact Sheet for Healthcare Providers: IncredibleEmployment.be  This test is not yet approved or cleared by the [place] FDA and has been authorized for detection and/or diagnosis of SARS-CoV-2 by FDA under an Emergency Use Authorization (EUA). This EUA will remain in effect (meaning this test can be used) for the duration of the COVID-19 declaration under Section 564(b)(1) of the Act, 21 U.S.C. section 360bbb-3(b)(1), unless the authorization is terminated or revoked.  Performed at  [facility], [place] [address], [place], [place] [address]   Blood Culture (routine x 2)     Status: None (Preliminary result)   Collection Time: 03/01/22  9:54 PM   Specimen: BLOOD  Result Value Ref Range Status   Specimen Description   Final    BLOOD RIGHT ANTECUBITAL Performed at [facility] [address] [place], [place] [address]    Special Requests   Final    BOTTLES DRAWN AEROBIC AND ANAEROBIC Blood Culture adequate volume Performed at [facility] [address], [place], [place] [address]    Culture   Final    NO GROWTH 4 DAYS Performed at [facility], [place] [address] [place], [place] [address]    Report Status PENDING  Incomplete  Blood Culture (routine x 2)     Status: Abnormal   Collection Time: 03/01/22  9:57 PM   Specimen: BLOOD  Result Value Ref Range Status   Specimen Description   Final    BLOOD LEFT ANTECUBITAL Performed at [facility] [address] [place], [place] [address]    Special Requests   Final    BOTTLES DRAWN AEROBIC AND ANAEROBIC Blood Culture adequate volume Performed at [facility] [address] [place] [address] [place], [place] [address]    Culture  Setup Time   Final    GRAM POSITIVE COCCI IN CLUSTERS AEROBIC BOTTLE ONLY Organism ID to follow CRITICAL RESULT CALLED TO, READ BACK BY AND VERIFIED WITH:  C/ PHARMD ELIZABETH M. 03/04/22 1115 A. LAFRANCE    Culture (A)  Final    STAPHYLOCOCCUS AURICULARIS THE SIGNIFICANCE OF ISOLATING THIS ORGANISM FROM A SINGLE SET OF BLOOD CULTURES WHEN MULTIPLE SETS ARE DRAWN IS UNCERTAIN. PLEASE NOTIFY THE MICROBIOLOGY DEPARTMENT WITHIN ONE WEEK IF SPECIATION AND SENSITIVITIES ARE REQUIRED. Performed at [facility], [place] [address] [place],  [address]    Report Status 03/06/2022 FINAL  Final  Blood Culture ID Panel (Reflexed)     Status: Abnormal   Collection Time: 03/01/22  9:57 PM  Result Value  Ref Range Status   Enterococcus faecalis NOT  DETECTED NOT DETECTED Final   Enterococcus Faecium NOT DETECTED NOT DETECTED Final   Listeria monocytogenes NOT DETECTED NOT DETECTED Final   Staphylococcus species DETECTED (A) NOT DETECTED Final    Comment: CRITICAL RESULT CALLED TO, READ BACK BY AND VERIFIED WITH:  C/ PHARMD ELIZABETH M. 03/04/22 1115 A. LAFRANCE    Staphylococcus aureus (BCID) NOT DETECTED NOT DETECTED Final   Staphylococcus epidermidis NOT DETECTED NOT DETECTED Final   Staphylococcus lugdunensis NOT DETECTED NOT DETECTED Final   Streptococcus species NOT DETECTED NOT DETECTED Final   Streptococcus agalactiae NOT DETECTED NOT DETECTED Final   Streptococcus pneumoniae NOT DETECTED NOT DETECTED Final   Streptococcus pyogenes NOT DETECTED NOT DETECTED Final   A.calcoaceticus-baumannii NOT DETECTED NOT DETECTED Final   Bacteroides fragilis NOT DETECTED NOT DETECTED Final   Enterobacterales NOT DETECTED NOT DETECTED Final   Enterobacter cloacae complex NOT DETECTED NOT DETECTED Final   Escherichia coli NOT DETECTED NOT DETECTED Final   Klebsiella aerogenes NOT DETECTED NOT DETECTED Final   Klebsiella oxytoca NOT DETECTED NOT DETECTED Final   Klebsiella pneumoniae NOT DETECTED NOT DETECTED Final   Proteus species NOT DETECTED NOT DETECTED Final   Salmonella species NOT DETECTED NOT DETECTED Final   Serratia marcescens NOT DETECTED NOT DETECTED Final   Haemophilus influenzae NOT DETECTED NOT DETECTED Final   Neisseria meningitidis NOT DETECTED NOT DETECTED Final   Pseudomonas aeruginosa NOT DETECTED NOT DETECTED Final   Stenotrophomonas maltophilia NOT DETECTED NOT DETECTED Final   Candida albicans NOT DETECTED NOT DETECTED Final   Candida auris NOT DETECTED NOT DETECTED Final   Candida glabrata NOT DETECTED NOT DETECTED Final   Candida krusei NOT DETECTED NOT DETECTED Final   Candida parapsilosis NOT DETECTED NOT DETECTED Final   Candida tropicalis NOT DETECTED NOT  DETECTED Final   Cryptococcus neoformans/gattii NOT DETECTED NOT DETECTED Final    Comment: Performed at [facility], [address], [place], [place] [address]  CSF culture     Status: None   Collection Time: 03/01/22 11:16 PM   Specimen: Back; Cerebrospinal Fluid  Result Value Ref Range Status   Specimen Description   Final    BACK Performed at [facility] [address], [place], [place] [address]    Special Requests   Final    NONE Performed at [facility], [place] [address] [place], [place] [address]    Gram Stain   Final    NO WBC SEEN NO ORGANISMS SEEN CYTOSPIN SMEAR Gram Stain Report Called to,Read Back By and Verified With: CALLED TO KELSEY,A ON [id number] AT [place]    Culture   Final    NO GROWTH 3 DAYS Performed at [facility], [place] [address] [place], [place] [address]    Report Status 03/05/2022 FINAL  Final  Urine Culture     Status: None   Collection Time: 03/02/22  1:24 AM   Specimen: In/Out Cath Urine  Result Value Ref Range Status   Specimen Description IN/OUT CATH URINE  Final   Special Requests NONE  Final   Culture   Final    NO GROWTH Performed at [facility], [place] [address] [place], [place] [address]    Report Status 03/03/2022 FINAL  Final  MRSA Next Gen by PCR, Nasal     Status: None   Collection Time: 03/02/22  1:33 AM   Specimen: Nasal Mucosa; Nasal Swab  Result Value Ref Range Status   MRSA  by PCR Next Gen NOT DETECTED NOT DETECTED Final    Comment: (NOTE) The GeneXpert MRSA Assay (FDA approved for NASAL specimens only), is one component of a comprehensive MRSA colonization surveillance program. It is not intended to diagnose MRSA infection nor to guide or monitor treatment for MRSA infections. Test performance is not FDA approved in patients less than 46 years old. Performed at [facility], [place] [address], [place], [place] [address]          Radiology  Studies: No results found.  Scheduled Meds:  Chlorhexidine Gluconate Cloth  6 each Topical M6381   folic acid  1 mg Intravenous Daily   mometasone-formoterol  2 puff Inhalation BID   multivitamin with minerals  1 tablet Oral Daily   mouth rinse  15 mL Mouth Rinse 4 times per day   mouth rinse  15 mL Mouth Rinse 4 times per day   pantoprazole (PROTONIX) IV  40 mg Intravenous Q24H   thiamine injection  100 mg Intravenous Daily   Continuous Infusions:  levETIRAcetam Stopped (03/06/22 2100)     LOS: 6 days   Time spent: 52mn  Chasten Blaze C Obe Ahlers, DO [facility]  If 7PM-7AM, please contact night-coverage [email or web address]  03/07/2022, 7:39 AM

## 2022-03-07 NOTE — Progress Notes (Signed)
HOSPITAL MEDICINE OVERNIGHT EVENT NOTE    Notified by nursing that patient continues to exhibit bouts of confusion and combativeness with repeated attempts to pull out medical devices.  Patient is typically not redirectable and is placing himself at risk of harm.  Unfortunately, there continues to be no sitter available.  Patient is already receiving psychotropic medications such as Zyprexa and as needed midazolam.  We will go ahead and renew two-point soft wrist restraints for now and continue to monitor closely.  Vernelle Emerald  MD [facility]

## 2022-03-08 ENCOUNTER — Inpatient Hospital Stay (HOSPITAL_COMMUNITY): Payer: Medicaid Other

## 2022-03-08 DIAGNOSIS — R569 Unspecified convulsions: Secondary | ICD-10-CM | POA: Diagnosis not present

## 2022-03-08 DIAGNOSIS — G9340 Encephalopathy, unspecified: Secondary | ICD-10-CM | POA: Diagnosis not present

## 2022-03-08 LAB — CBC
HCT: 28.9 % — ABNORMAL LOW (ref 39.0–52.0)
Hemoglobin: 8.8 g/dL — ABNORMAL LOW (ref 13.0–17.0)
MCH: 24.7 pg — ABNORMAL LOW (ref 26.0–34.0)
MCHC: 30.4 g/dL (ref 30.0–36.0)
MCV: 81.2 fL (ref 80.0–100.0)
Platelets: 191 10*3/uL (ref 150–400)
RBC: 3.56 MIL/uL — ABNORMAL LOW (ref 4.22–5.81)
RDW: 21.9 % — ABNORMAL HIGH (ref 11.5–15.5)
WBC: 9.1 10*3/uL (ref 4.0–10.5)
nRBC: 0 % (ref 0.0–0.2)

## 2022-03-08 LAB — COMPREHENSIVE METABOLIC PANEL
ALT: 23 U/L (ref 0–44)
AST: 25 U/L (ref 15–41)
Albumin: 2.5 g/dL — ABNORMAL LOW (ref 3.5–5.0)
Alkaline Phosphatase: 48 U/L (ref 38–126)
Anion gap: 8 (ref 5–15)
BUN: <5 mg/dL — ABNORMAL LOW (ref 6–20)
CO2: 20 mmol/L — ABNORMAL LOW (ref 22–32)
Calcium: 7.8 mg/dL — ABNORMAL LOW (ref 8.9–10.3)
Chloride: 114 mmol/L — ABNORMAL HIGH (ref 98–111)
Creatinine, Ser: 0.66 mg/dL (ref 0.61–1.24)
GFR, Estimated: >60 mL/min (ref >60)
Glucose, Bld: 93 mg/dL (ref 70–99)
Potassium: 3.1 mmol/L — ABNORMAL LOW (ref 3.5–5.1)
Sodium: 142 mmol/L (ref 135–145)
Total Bilirubin: 1.1 mg/dL (ref 0.3–1.2)
Total Protein: 4.7 g/dL — ABNORMAL LOW (ref 6.5–8.1)

## 2022-03-08 MED ORDER — POTASSIUM CHLORIDE CRYS ER 20 MEQ PO TBCR
40.0000 meq | EXTENDED_RELEASE_TABLET | ORAL | Status: AC
  Administered 2022-03-08 (×2): 40 meq via ORAL
  Filled 2022-03-08 (×2): qty 2

## 2022-03-08 MED ORDER — BLISTEX MEDICATED EX OINT
TOPICAL_OINTMENT | CUTANEOUS | Status: DC | PRN
  Filled 2022-03-08 (×2): qty 6.3

## 2022-03-08 MED ORDER — CETAPHIL MOISTURIZING EX LOTN
TOPICAL_LOTION | Freq: Every day | CUTANEOUS | Status: DC
  Filled 2022-03-08: qty 473

## 2022-03-08 NOTE — Progress Notes (Addendum)
Speech Language Pathology Treatment: Dysphagia  Patient Details Name: Timothy Hubbard MRN: [id number] DOB: 1990/11/28 Today's Date: 03/08/2022 Time: 4098-1191 SLP Time Calculation (min) (ACUTE ONLY): 14 min  Assessment / Plan / Recommendation Clinical Impression  Pt was seen for dysphagia treatment. He was alert and cooperative during the session, but was intermittently noted to attempt to bite the ties from the restraint instead of the graham cracker which was in his other hand. Articulatory precision was intermittently reduced and speech intelligibility impacted when speaking rate was increased. Pt's nurse reported intermittent oral holding with p.o. intake this morning. Per the RN, pt also required cues to swallow pills and "took a while" to eat. Pt tolerated regular texture solids and thin liquids via straw using individual and consecutive swallows without overt s/sx of aspiration. Mastication and oral clearance were [facility] and cues were not required. A dysphagia 3 diet with thin liquids is recommended at this time. SLP will continue to follow pt.     HPI HPI: Timothy Hubbard is a 31 yo man with a hx of HTN, asthma, GERD, ETOH use, here after multiple seizure like episodes and AMS.  Pt not intubated. Failed [facility]. MRI negative.      SLP Plan  Continue with current plan of care      Recommendations for follow up therapy are one component of a multi-disciplinary discharge planning process, led by the attending physician.  Recommendations may be updated based on patient status, additional functional criteria and insurance authorization.    Recommendations  Diet recommendations: Dysphagia 3 (mechanical soft);Thin liquid Liquids provided via: Cup;Straw Medication Administration: Whole meds with puree Supervision: Staff to assist with self feeding Compensations: Slow rate Postural Changes and/or Swallow Maneuvers: Seated upright 90 degrees                Oral Care Recommendations: Oral care  BID Follow Up Recommendations: Acute inpatient rehab (3hours/day) SLP Visit Diagnosis: Dysphagia, oropharyngeal phase (R13.12) Plan: Continue with current plan of care         Timothy Hubbard, [place], [place] Office number [phone] Pager [place]  03/08/2022, 1:29 PM

## 2022-03-08 NOTE — Progress Notes (Signed)
Inpatient Rehab Admissions Coordinator:   Following for my colleague, Gayland Curry.  Note possible seizure like episode yesterday and neuro recommendations for LTM.  Will continue to follow.   Shann Medal, PT, DPT Admissions Coordinator [phone] 03/08/22  9:31 AM

## 2022-03-08 NOTE — Progress Notes (Signed)
LTM maint complete - no skin breakdown  Serviced ten leads. [facility] monitored, Event button test confirmed by [facility].

## 2022-03-08 NOTE — Progress Notes (Signed)
Subjective: Has had two episodes of staring so far. One was witnessed by psych team at around 10am described as "suddenly stopped talking to me was looking off to the L side. I kept calling his name, and tried to get his attention but he would not respond. Then suddenly he turned back to me, and I asked him where we were located and had to reorient him. Prior to the event I was interviewing him and he was talking and responding to me."   ROS: negative except above  Examination  Vital signs in last 24 hours: Temp:  [98 F (36.7 C)-98.6 F (37 C)] 98.2 F (36.8 C) (07/04 0740) Pulse Rate:  [54-111] 66 (07/04 0740) Resp:  [16-18] 18 (07/04 0740) BP: (129-148)/(92-103) 147/103 (07/04 0740) SpO2:  [90 %-100 %] 100 % (07/04 0740) Weight:  [57.1 kg] 57.1 kg (07/04 0425)  General: lying in bed, NAD Neuro: Awake, alert, oriented x 3, able to name objects, speech was dysarthric, able to consistently follow commands, PERRLA, EOMI, no facial asymmetry, spontaneously moving all 4 extremities  Basic Metabolic Panel: Recent Labs  Lab 03/01/22 2121 03/02/22 0640 03/03/22 0842 03/04/22 0901 03/05/22 0358 03/06/22 0310 03/07/22 0255 03/08/22 0039  NA 141 138 140 140 142 141 142 142  K 3.9 3.1* 2.8* 3.2* 3.1* 3.3* 3.1* 3.1*  CL 103 103 105 111 111 114* 114* 114*  CO2 12* 19* 23 22 19* 19* 20* 20*  GLUCOSE 119* 126* 101* 110* 100* 89 85 93  BUN 10 5* 7 5* <5* <5* <5* <5*  CREATININE 1.29* 0.83 0.70 0.67 0.66 0.68 0.63 0.66  CALCIUM 10.0 8.8* 8.4* 8.1* 8.2* 8.4* 8.2* 7.8*  MG 2.0 1.7 1.9  --   --   --   --   --   PHOS  --   --  3.1  --   --   --   --   --     CBC: Recent Labs  Lab 03/01/22 2133 03/02/22 0640 03/03/22 0842 03/04/22 0901 03/04/22 1105 03/05/22 0358 03/05/22 0645 03/06/22 0310 03/07/22 0255 03/08/22 0039  WBC 12.8* 9.0   < > 4.7  --  5.6  --  8.7 9.3 9.1  NEUTROABS 9.5* 7.2  --   --   --   --   --   --   --   --   HGB 8.7* 8.0*   < > 6.5*   < > 6.5* 6.3* 9.4* 9.3*  8.8*  HCT 30.9* 26.8*   < > 22.7*   < > 21.7* 21.4* 29.1* 31.0* 28.9*  MCV 80.3 78.6*   < > 79.1*  --  78.6*  --  78.9* 79.7* 81.2  PLT 305 243   < > 179  --  175  --  186 195 191   < > = values in this interval not displayed.     Coagulation Studies: No results for input(s): "LABPROT", "INR" in the last 72 hours.  Imaging No new brain imaging overnight   ASSESSMENT AND PLAN: 31 year old male with history of substance use disorder who presented with new onset seizures and continues to be aphasic.   New onset seizures Cocaine use disorder Cannabis use disorder Acute encephalopathy, toxic-metabolic, resolved -Likely provoked seizure in setting of alcohol use, cocaine use. - Also noted to have episodes if staring consistent with Non-epileptic event so far   Recommendations - Continue LTM EEG for characterization of spells -Continue Keppra 500 mg twice daily.  Will only increase  if seizures on eeg -Seizure precautions including do not drive -As needed IV Ativan for clinical seizures -Management of rest of comorbidities per primary team -Follow-up with neurology 4 to 6 weeks. Patient presented with convulsive status epilepticus.  Therefore recommend continuing this for now.  If patient remains seizure-free, this can potentially be weaned off in future.    I have spent a total of 28   minutes with the patient reviewing hospital notes,  test results, labs and examining the patient as well as establishing an assessment and plan that was discussed personally with the patient.  > 50% of time was spent in direct patient care.   Zeb Comfort Epilepsy [facility] For questions after 5pm please refer to AMION to reach the Neurologist on call

## 2022-03-08 NOTE — Progress Notes (Signed)
PROGRESS NOTE   Timothy Hubbard  UMP:[id number] DOB: 05-02-1991 DOA: 03/01/2022 PCP: Alcus Dad, MD   Brief Narrative:  [name] is a 31 yo man with a hx of HTN, asthma, GERD, ETOH use, here after multiple seizure like episodes and AMS.  He was previously noted to be normal per family, then came out of the bathroom stumbling and fell, without notable trauma. Then had teeth grinding and apparent seizure activity for 7 min.  Was altered, lethargic and confused afterwards. Additional seizure about 1 hour later, and another 1 hr later.  Brought to the ED following the third episode.    Typically non compliant with medications intermittently at baseline - no current active home medications listed on med reconciliation. Per his mother, he has been acting and feeling normal as far as she knows (he doesn't complain to her much and keeps his medical issues private from her).  Some R side pain recently. No hx of etoh withdrawal.    6/28 admitted to ICU with new onset sz activity, LTM, protecting his airway 6/29 no seizure on LTM, head imaging negative, still dysarthric but improving 6/30 Stable transfer to medical floor - requesting IV narcotics (previously on in ICU) which we discussed were not going to be replaced for his chronic lumbago. Will continue OTC medications PRN. 7/1 Fall overnight trying to get out of bed without calling for help - generally being noncompliant. Pulling out IV multiple times - requiring blood transfusion this am.  7/2 -hemoglobin improving, worsening mental status over the last 24 hours, noncompliant today with medications, attempting to remove IV, somewhat combative with staff, restraints were placed 7/3 Mental status remains labile - consulted Psych - start Zyprexa today, remains quite altered today without clear etiology **7/3 update - patient having atypical seizure-like-activity now that family is present. Initially a 'grandmal' appearing episode resolving prior to staff  arrival - midazolam administered. Second 'seizure' was patient simply slapping single arm against bed - upon staff arrival he was back to his previous baseline (although not back to usual baseline per mother this is his LOC over the past 72h).  7/4 EEG ongoing --> restraints replaced overnight due to patient noncompliance with procedure.  Assessment & Plan:   Principal Problem:   Encephalopathy acute Active Problems:   TOBACCO USER   Essential hypertension   Asthma in adult, moderate persistent, with acute exacerbation   Gastroesophageal reflux disease   Alcohol use disorder   Fever in adult   Seizure (HCC)   Hypokalemia   Iron deficiency anemia  New onset seizure activity ? Provoked secondary to polypharmacy/etoh cessation Without signs/symptoms of withdrawals currently. Chronic ETOH use /abuse Acute Encephalopathy, ongoing -Etoh intake cutdown recently (unclear if drastic cut or cessation) -regardless he is outside the window for withdrawals at this point -Low suspicion meningitis based on CSF results, procal low -CTA and MRI brain unremarkable - Neuro following - continue keppra - EEG unremarkable - UDS positive for cocaine and cannabinoids(benzos likely given in the field during seizure) -polypharmacy likely playing a role in his mental status and behavior -Mental status fluctuating, somewhat combative with staff today restraints were placed -Start zyprexa - psych indicates this mental status/behavior change is metabolic in nature, subsequently signed off. Appreciate their insight and recommendations. -TSH WNL - Continue thiamine, folic acid and MV supplementation given Etoh history.   Ambulatory dysfunction  -Multifactorial, secondary to above, continue to follow with PT OT -Remains unsafe disposition home at this time given weakness and unclear  support at home with mother/father  Dysphagia -Likely secondary to acute siezure, follows with  speech  Hypokalemia Hypomagnesemia In the setting of chronic alcohol abuse/malnutrition -Replete as appropriate, follow am labs   Iron deficiency anemia Rule out acute blood loss anemia Hgb drifting down, 8.7->8.0->7.3-->6.3 --> 9.4(post 2u transfusion) Fe stores low, possibly secondary to malnutrition Occult card pending 2 unit PRBC 7/1  HTN -Blood pressure increasing -No listed home medications, will start low-dose amlodipine and follow clinically -Currently well controlled   GERD -continue protonix.    Asthma, tobacco use -prn nebs, not currently wheezing  Incorrect/mis-documented Bipolar/Schizophrenia  - Unable to find this documented diagnosis in the chart -Psych sidelined, they are unable to find any previous documentation or medications consistent with these diagnoses -Unclear why these are included in the chart but these will need to be corrected prior to discharge  DVT prophylaxis: Lovenox Code Status: Full Family Communication: Mother updated over phone  Status is: Inpt  Dispo: The patient is from: Home              Anticipated d/c is to: Home              Anticipated d/c date is: 48-72h              Patient currently NOT medically stable for discharge  Consultants:  PCCM, Neuro, Psych  Procedures:  None  Antimicrobials:  None   Subjective: No acute issues or events overnight, mental status continues to be quite labile, EEG sensors being placed this am - patient back in restraints due to medical non-compliance.  Objective: Vitals:   03/07/22 2337 03/08/22 0400 03/08/22 0425 03/08/22 0722  BP: (!) 142/97 (!) 142/94    Pulse: 89 85  (!) 102  Resp: '16 18  16  '$ Temp: 98 F (36.7 C) 98.4 F (36.9 C)    TempSrc: Oral Oral    SpO2: 100% 100%    Weight:   57.1 kg   Height:        Intake/Output Summary (Last 24 hours) at 03/08/2022 0732 Last data filed at 03/08/2022 0539 Gross per 24 hour  Intake 340 ml  Output --  Net 340 ml    Filed Weights    03/04/22 0524 03/05/22 0500 03/08/22 0425  Weight: 61.3 kg 61.7 kg 57.1 kg    Examination:  General exam: Appears calm and comfortable, oriented to person only Respiratory system: Clear to auscultation. Respiratory effort normal. Cardiovascular system: S1 & S2 heard, RRR. No JVD, murmurs, rubs, gallops or clicks. No pedal edema. Gastrointestinal system: Abdomen is nondistended, soft and nontender. No organomegaly or masses felt. Normal bowel sounds heard. Extremities: Symmetric 5 x 5 power. Skin: No rashes, lesions or ulcers Psychiatry: Poor judgement and insight, mood & affect labile   Data Reviewed: I have personally reviewed following labs and imaging studies  CBC: Recent Labs  Lab 03/01/22 2133 03/02/22 0640 03/03/22 0842 03/04/22 0901 03/04/22 1105 03/05/22 0358 03/05/22 0645 03/06/22 0310 03/07/22 0255 03/08/22 0039  WBC 12.8* 9.0   < > 4.7  --  5.6  --  8.7 9.3 9.1  NEUTROABS 9.5* 7.2  --   --   --   --   --   --   --   --   HGB 8.7* 8.0*   < > 6.5*   < > 6.5* 6.3* 9.4* 9.3* 8.8*  HCT 30.9* 26.8*   < > 22.7*   < > 21.7* 21.4* 29.1* 31.0* 28.9*  MCV  80.3 78.6*   < > 79.1*  --  78.6*  --  78.9* 79.7* 81.2  PLT 305 243   < > 179  --  175  --  186 195 191   < > = values in this interval not displayed.    Basic Metabolic Panel: Recent Labs  Lab 03/01/22 2121 03/02/22 0640 03/03/22 1884 03/04/22 0901 03/05/22 0358 03/06/22 0310 03/07/22 0255 03/08/22 0039  NA 141 138 140 140 142 141 142 142  K 3.9 3.1* 2.8* 3.2* 3.1* 3.3* 3.1* 3.1*  CL 103 103 105 111 111 114* 114* 114*  CO2 12* 19* 23 22 19* 19* 20* 20*  GLUCOSE 119* 126* 101* 110* 100* 89 85 93  BUN 10 5* 7 5* <5* <5* <5* <5*  CREATININE 1.29* 0.83 0.70 0.67 0.66 0.68 0.63 0.66  CALCIUM 10.0 8.8* 8.4* 8.1* 8.2* 8.4* 8.2* 7.8*  MG 2.0 1.7 1.9  --   --   --   --   --   PHOS  --   --  3.1  --   --   --   --   --     GFR: Estimated Creatinine Clearance: 107.1 mL/min (by C-G formula based on SCr of 0.66  mg/dL). Liver Function Tests: Recent Labs  Lab 03/01/22 2121 03/08/22 0039  AST 57* 25  ALT <5 23  ALKPHOS 64 48  BILITOT 1.3* 1.1  PROT 7.2 4.7*  ALBUMIN 4.1 2.5*    No results for input(s): "LIPASE", "AMYLASE" in the last 168 hours. Recent Labs  Lab 03/02/22 0640  AMMONIA 19    Coagulation Profile: Recent Labs  Lab 03/01/22 2133  INR 0.9    Cardiac Enzymes: No results for input(s): "CKTOTAL", "CKMB", "CKMBINDEX", "TROPONINI" in the last 168 hours. BNP (last 3 results) No results for input(s): "PROBNP" in the last 8760 hours. HbA1C: No results for input(s): "HGBA1C" in the last 72 hours.  CBG: Recent Labs  Lab 03/03/22 0717 03/03/22 1209 03/03/22 1547 03/04/22 0839 03/04/22 1156  GLUCAP 104* 103* 105* 115* 145*    Lipid Profile: No results for input(s): "CHOL", "HDL", "LDLCALC", "TRIG", "CHOLHDL", "LDLDIRECT" in the last 72 hours. Thyroid Function Tests: No results for input(s): "TSH", "T4TOTAL", "FREET4", "T3FREE", "THYROIDAB" in the last 72 hours.  Anemia Panel: No results for input(s): "VITAMINB12", "FOLATE", "FERRITIN", "TIBC", "IRON", "RETICCTPCT" in the last 72 hours.  Sepsis Labs: Recent Labs  Lab 03/01/22 2122 03/02/22 0030 03/02/22 0640  PROCALCITON  --  0.12  --   LATICACIDVEN >9.0*  --  3.2*     Recent Results (from the past 240 hour(s))  Resp Panel by RT-PCR (Flu A&B, Covid) Anterior Nasal Swab     Status: None   Collection Time: 03/01/22 12:48 AM   Specimen: Anterior Nasal Swab  Result Value Ref Range Status   SARS Coronavirus 2 by RT PCR NEGATIVE NEGATIVE Final    Comment: (NOTE) SARS-CoV-2 target nucleic acids are NOT DETECTED.  The SARS-CoV-2 RNA is generally detectable in upper respiratory specimens during the acute phase of infection. The lowest concentration of SARS-CoV-2 viral copies this assay can detect is 138 copies/mL. A negative result does not preclude SARS-Cov-2 infection and should not be used as the sole basis  for treatment or other patient management decisions. A negative result may occur with  improper specimen collection/handling, submission of specimen other than nasopharyngeal swab, presence of viral mutation(s) within the areas targeted by this assay, and inadequate number of viral copies(<138 copies/mL). A  negative result must be combined with clinical observations, patient history, and epidemiological information. The expected result is Negative.  Fact Sheet for Patients:  EntrepreneurPulse.com.au  Fact Sheet for Healthcare Providers:  IncredibleEmployment.be  This test is no t yet approved or cleared by the [place] FDA and  has been authorized for detection and/or diagnosis of SARS-CoV-2 by FDA under an Emergency Use Authorization (EUA). This EUA will remain  in effect (meaning this test can be used) for the duration of the COVID-19 declaration under Section 564(b)(1) of the Act, 21 U.S.C.section 360bbb-3(b)(1), unless the authorization is terminated  or revoked sooner.       Influenza A by PCR NEGATIVE NEGATIVE Final   Influenza B by PCR NEGATIVE NEGATIVE Final    Comment: (NOTE) The Xpert Xpress SARS-CoV-2/FLU/RSV plus assay is intended as an aid in the diagnosis of influenza from Nasopharyngeal swab specimens and should not be used as a sole basis for treatment. Nasal washings and aspirates are unacceptable for Xpert Xpress SARS-CoV-2/FLU/RSV testing.  Fact Sheet for Patients: EntrepreneurPulse.com.au  Fact Sheet for Healthcare Providers: IncredibleEmployment.be  This test is not yet approved or cleared by the [place] FDA and has been authorized for detection and/or diagnosis of SARS-CoV-2 by FDA under an Emergency Use Authorization (EUA). This EUA will remain in effect (meaning this test can be used) for the duration of the COVID-19 declaration under Section 564(b)(1) of the Act, 21  U.S.C. section 360bbb-3(b)(1), unless the authorization is terminated or revoked.  Performed at [facility], [place] [address] [place], [place] [address]   Blood Culture (routine x 2)     Status: None   Collection Time: 03/01/22  9:54 PM   Specimen: BLOOD  Result Value Ref Range Status   Specimen Description   Final    BLOOD RIGHT ANTECUBITAL Performed at [place] [address], [place], [place] [address]    Special Requests   Final    BOTTLES DRAWN AEROBIC AND ANAEROBIC Blood Culture adequate volume Performed at [place] [address] [place], [place] [address]    Culture   Final    NO GROWTH 5 DAYS Performed at [facility], [place] [address] [place], [place] [address]    Report Status 03/07/2022 FINAL  Final  Blood Culture (routine x 2)     Status: Abnormal   Collection Time: 03/01/22  9:57 PM   Specimen: BLOOD  Result Value Ref Range Status   Specimen Description   Final    BLOOD LEFT ANTECUBITAL Performed at [place] [address] [place], [place] [address]    Special Requests   Final    BOTTLES DRAWN AEROBIC AND ANAEROBIC Blood Culture adequate volume Performed at [facility] [address], [place], [place] [address]    Culture  Setup Time   Final    GRAM POSITIVE COCCI IN CLUSTERS AEROBIC BOTTLE ONLY Organism ID to follow CRITICAL RESULT CALLED TO, READ BACK BY AND VERIFIED WITH:  C/ PHARMD ELIZABETH M. 03/04/22 1115 A. LAFRANCE    Culture (A)  Final    STAPHYLOCOCCUS AURICULARIS THE SIGNIFICANCE OF ISOLATING THIS ORGANISM FROM A SINGLE SET OF BLOOD CULTURES WHEN MULTIPLE SETS ARE DRAWN IS UNCERTAIN. PLEASE NOTIFY THE MICROBIOLOGY DEPARTMENT WITHIN ONE WEEK IF SPECIATION AND SENSITIVITIES ARE REQUIRED. Performed at [facility], [place] [address], [place],  [address]    Report Status 03/06/2022 FINAL  Final  Blood Culture  ID Panel  (Reflexed)     Status: Abnormal   Collection Time: 03/01/22  9:57 PM  Result Value Ref Range Status   Enterococcus faecalis NOT DETECTED NOT DETECTED Final   Enterococcus Faecium NOT DETECTED NOT DETECTED Final   Listeria monocytogenes NOT DETECTED NOT DETECTED Final   Staphylococcus species DETECTED (A) NOT DETECTED Final    Comment: CRITICAL RESULT CALLED TO, READ BACK BY AND VERIFIED WITH:  C/ PHARMD ELIZABETH M. 03/04/22 1115 A. LAFRANCE    Staphylococcus aureus (BCID) NOT DETECTED NOT DETECTED Final   Staphylococcus epidermidis NOT DETECTED NOT DETECTED Final   Staphylococcus lugdunensis NOT DETECTED NOT DETECTED Final   Streptococcus species NOT DETECTED NOT DETECTED Final   Streptococcus agalactiae NOT DETECTED NOT DETECTED Final   Streptococcus pneumoniae NOT DETECTED NOT DETECTED Final   Streptococcus pyogenes NOT DETECTED NOT DETECTED Final   A.calcoaceticus-baumannii NOT DETECTED NOT DETECTED Final   Bacteroides fragilis NOT DETECTED NOT DETECTED Final   Enterobacterales NOT DETECTED NOT DETECTED Final   Enterobacter cloacae complex NOT DETECTED NOT DETECTED Final   Escherichia coli NOT DETECTED NOT DETECTED Final   Klebsiella aerogenes NOT DETECTED NOT DETECTED Final   Klebsiella oxytoca NOT DETECTED NOT DETECTED Final   Klebsiella pneumoniae NOT DETECTED NOT DETECTED Final   Proteus species NOT DETECTED NOT DETECTED Final   Salmonella species NOT DETECTED NOT DETECTED Final   Serratia marcescens NOT DETECTED NOT DETECTED Final   Haemophilus influenzae NOT DETECTED NOT DETECTED Final   Neisseria meningitidis NOT DETECTED NOT DETECTED Final   Pseudomonas aeruginosa NOT DETECTED NOT DETECTED Final   Stenotrophomonas maltophilia NOT DETECTED NOT DETECTED Final   Candida albicans NOT DETECTED NOT DETECTED Final   Candida auris NOT DETECTED NOT DETECTED Final   Candida glabrata NOT DETECTED NOT DETECTED Final   Candida krusei NOT DETECTED NOT DETECTED Final   Candida  parapsilosis NOT DETECTED NOT DETECTED Final   Candida tropicalis NOT DETECTED NOT DETECTED Final   Cryptococcus neoformans/gattii NOT DETECTED NOT DETECTED Final    Comment: Performed at [facility], [address], [place], [place] [address]  CSF culture     Status: None   Collection Time: 03/01/22 11:16 PM   Specimen: Back; Cerebrospinal Fluid  Result Value Ref Range Status   Specimen Description   Final    BACK Performed at [place] [address], [place], [place] [address]    Special Requests   Final    NONE Performed at [facility], [place] [address], [place], [place] [address]    Gram Stain   Final    NO WBC SEEN NO ORGANISMS SEEN CYTOSPIN SMEAR Gram Stain Report Called to,Read Back By and Verified With: CALLED TO KELSEY,A ON [id number] AT [place]    Culture   Final    NO GROWTH 3 DAYS Performed at [facility], [place] [address], [place], [place] [address]    Report Status 03/05/2022 FINAL  Final  Urine Culture     Status: None   Collection Time: 03/02/22  1:24 AM   Specimen: In/Out Cath Urine  Result Value Ref Range Status   Specimen Description IN/OUT CATH URINE  Final   Special Requests NONE  Final   Culture   Final    NO GROWTH Performed at [facility], [place] [address] [place],  [address]    Report Status 03/03/2022 FINAL  Final  MRSA Next Gen by PCR, Nasal  Status: None   Collection Time: 03/02/22  1:33 AM   Specimen: Nasal Mucosa; Nasal Swab  Result Value Ref Range Status   MRSA by PCR Next Gen NOT DETECTED NOT DETECTED Final    Comment: (NOTE) The GeneXpert MRSA Assay (FDA approved for NASAL specimens only), is one component of a comprehensive MRSA colonization surveillance program. It is not intended to diagnose MRSA infection nor to guide or monitor treatment for MRSA infections. Test performance is not FDA approved in patients less than 33 years old. Performed at [facility], [place] [address] [place], [place] [address]    Radiology Studies: No results found.  Scheduled Meds:  Chlorhexidine Gluconate Cloth  6 each Topical G5364   folic acid  1 mg Intravenous Daily   mometasone-formoterol  2 puff Inhalation BID   multivitamin with minerals  1 tablet Oral Daily   OLANZapine  5 mg Oral QHS   mouth rinse  15 mL Mouth Rinse 4 times per day   pantoprazole (PROTONIX) IV  40 mg Intravenous Q24H   thiamine injection  100 mg Intravenous Daily   Continuous Infusions:  levETIRAcetam 500 mg (03/07/22 2249)     LOS: 7 days   Time spent: 84mn  Heith Haigler C Martie Fulgham, DO [facility]  If 7PM-7AM, please contact night-coverage [email or web address]  03/08/2022, 7:32 AM

## 2022-03-08 NOTE — Consult Note (Signed)
[facility] New Face-to-Face Psychiatric Evaluation   Service Date: March 08, 2022 LOS:  LOS: 7 days    Assessment  Timothy Hubbard is a 31 y.o. male admitted medically for 03/01/2022  9:19 PM for multiple seizure like episodes and AMS. He carries the psychiatric diagnoses of Bipolar disorder ? and has a past medical history of   HTN, asthma, GERD, ETOH use .Psychiatry was consulted for "abnormal behavior" by Dr. Avon Gully.    His current presentation of fluctuating mentation and acute change in mental status with abnormal labs is most consistent with metabolic encephalopathy. He meets criteria for encephalopathy based on presentation. Per mom, patient does not take psychotropic medication outpatient, and has not in at least the last 6 months. On initial examination, patient is pleasant and intermittently has episodes where he more difficult to understand and not oriented. Please see plan below for detailed recommendations.   Based on hx patient's dx of Bipolar is unlikely. Patient was diagnosed at approx 83-12 yo, no one is able to recall symptoms aligning with hypomania or manic episodes. Patient was also diagnosed in the context of depressed behavior in the setting of the loss of a sibling. Patient appears to have held a steady job and not had any known inpatient psychiatric admissions, despite not being on medications in at least the last 6 mon. Patient is not able to recall psychosis outside of the context of substance use; however his MRI endorses advanced atrophy for age which can be seen in schizophrenic patients. Patient does have a family hx of schizophrenia/ bipolar disorder.   Patient does also endorse being stressed recently and had some difficulty addressing this and psychogenic nonepileptic seizures may appear to be part of presentation However, this does not account for the significant anemia requiring multiple blood transfusions, persistent, hypokalemia, hypoalbunemia,  hypocalcemia, growth of Staph auricularis in blood cx, and A fib during hospitalization noted on EEG 6/29 as well as the noted fatigue and weakness over 2 mon seen by family.  Patient's multiple abnormalities are inconsistent with psychiatric cause being the primary cause of symptoms at this time.   Diagnoses:  Active Hospital problems: Principal Problem:   Encephalopathy acute Active Problems:   TOBACCO USER   Essential hypertension   Asthma in adult, moderate persistent, with acute exacerbation   Gastroesophageal reflux disease   Alcohol use disorder   Fever in adult   Seizure (HCC)   Hypokalemia   Iron deficiency anemia     Plan  ## Safety and Observation Level:  - Based on my clinical evaluation, I estimate the patient to be at mild risk of self harm in the current setting - At this time, we recommend a routine level of observation unless patient becomes combative again while agitated. This decision is based on my review of the chart including patient's history and current presentation, interview of the patient, mental status examination, and consideration of suicide risk including evaluating suicidal ideation, plan, intent, suicidal or self-harm behaviors, risk factors, and protective factors. This judgment is based on our ability to directly address suicide risk, implement suicide prevention strategies and develop a safety plan while the patient is in the clinical setting. Please contact our team if there is a concern that risk level has changed.   ## Medications:  -- Per primary and Neurology  ## Medical Decision Making Capacity:  -- not formally assessed  ## Further Work-up:  -- Per primary -- Stool occult, still not complete   --  most recent EKG on 7/4 had QtC of 482, w/ HR 94 -- Pertinent labwork reviewed earlier this admission includes: CSF VDRL, (-), HSV 1 and 2, CSF: (-), CMP: K+ 3.1, Albumin 2.5, Ca 7.8, Hgb:6.5>>>> 8.8, B12: WNL, Folate: WNL, LA 3.2,  Procalcitonin: 0.12, Bcx: Staph  auricularis, UA moderate blood, UDS: + cocaine, + TCH, + BZDs  ## Disposition:  -- Per primary  ## Behavioral / Environmental:  DELIRIUM RECS 1: Avoid unnecessary benzodiazepines, antihistamines, anticholinergics, and minimize opiate use as these may worsen delirium. 2:Assess, prevent and manage pain as lack of treatment can result in delirium.  3: Recommend consult to PT/OT if not already done. Early mobility and exercise has been shown to decrease duration of delirium.  4:Provide appropriate lighting and clear signage; a clock and calendar should be easily visible to the patient. 5:Monitor environmental factors. Reduce light and noise at night (close shades, turn off lights, turn off TV, ect). Correct any alterations in sleep cycle. 6: Reorient the patient to person, place, time and situation on each encounter.  7: Correct sensory deficits if possible (replace eye glasses, hearing aids, ect). 8: Avoid restraints. Severely delirious patients benefit from constant observation by a sitter. 9: Do not leave patient unattended.     ##Legal Status   Thank you for this consult request. Recommendations have been communicated to the primary team.  We will sign off at this time.   PGY-3 Freida Busman, MD   NEW  history  Relevant Aspects of Hospital Course:  Admitted on 03/01/2022 for  HTN, asthma, GERD, ETOH use, here after multiple seizure like episodes and AMS.  Patient Report:  On assessment this AM patient is sitting upright getting his EEG nodes placed. Patient reports that he is doing ok and not in any pain. Patient is oriented to place, day, situation, and self. When patient communicates the above, he is understable with minimally slurred speech. When patient begins to answer for orientation to year, he suddenly begins to stare and repeat "4th of July" 2-3 times. Patient then stops and becomes hard to understand with slurred speech. Provider then ask  patient again where he is and he does not answer correctly as he had moments earlier. Provider reorients patient and then he suddenly began speaking as he had at the beginning of the assessment.   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 Patient reports "it was hard" when asked why he decided to leave school early. Patient endorses that he did find school work difficult an this contributed to his leaving school. Patient reports that he has been working at the same place since.   Patient reports that he did do cocaine approx 1 week prior to today, and that it was because he was "stressed out." Patient reports that many things have been stressing him out recently. It was at this point patient again became more difficult to understand, with slurred speech and repeated the same statement multiple times (although this may have been in an effort to get provider to understand him vs perseverating). Patient did not require reorientation after his speech began to clear up, and was eventually able to make it clear that he had been stressed by multiple factors including financial issues and relationship issues. Patient reports that despite this he believes he has been sleeping well, denies anhedonia, feelings of guilt/ hopelessness/ and  worthlessness, endorses that his energy has been fair, and his concentration and appetite he felt had been decent. Patient reports that he felt he had been doing well until he had his seizure like event. Patient denied SI and denies SI, HI and AVH on assessment today. Patient reports "that's crazy" when assessed for recent hallucinations.  Patient is able to recall that he has had hallucinations in the past but only in the context of using THC. Patient endorses that he has not used THC in some timed due to having a job. Patient denies feeling significantly  anxious in the weeks leading up to present.  When assessing patient for bipolar disorder, he is never able to endorse a hx of manic or hypomanic episodes. Patient is able to endorse that he was seen at [facility] in the past, (when communicating this he had another event of slurred speech with alteration in mentation). Patient was not able to recall medications he had been on by name, but was able to recognize some with provider listed some out.   Patient reported that he was still taking medication, but was not able to recall the provider's name and endorsed that his mother gave him the medicine and she would be able to give more details. Patient reported that he did not want his girlfriend to know he was in the hospital, and endorsed that she was likely under the impression that he was at his mom's house.   More information on episodes noticed during assessment-   After one of his episodes of slurred speech, patient was did not require reorientation and reported that he knew he had a seizure. Patient reports that he is able to notice a sudden change including, "my lips get dry, stopping of the "sense" (words?), get cold, and get ashy."   At one point during assessment patient suddenly stopped answering patients. Instead of slurring with perseveration he suddenly looked over to the left and did not respond to verbal stimuli. This last for a few seconds despite snapping fingers for patient to acknowledge the right side of the room and calling patient's name on the right side of the room. Eventually patient suddenly responded and looked over at provider and appeared slow to respond.   Collateral information:  Mom-  Mom confirms that he was dx with Bipolar disorder at [facility] when he was approx 73-12 yo. Mom reports that his brother was recently murdered at the time and he was depressed. Mom is not able to endorse a hx of manic episodes. Mom reports that patient may sleep from 8PM- 6AM but he may wake up in  the middle night on occasion.   Mom reports that she has noticed that patient has been getting physically weak and she put him on iron pills. Mom reports that she noticed that patient was getting weak and fatigued over the last 2 months.  Mom reports that patient does not like the taste of pills and wont really take them.   Mom reports that patient does not currently take Depakote and Seroquel and he has not for a while.   Mom reports that he does not work anymore at [employer], but he has not done this in 2-3 months. Mom reports that patient gets SSI, due to his mental health. Mom reports that patient has not been hallucinating.   Mom reports that she does not think that patient's current presentation is psych related, she has noticed that patient has had fluctuation in his mental status while he  has been in the hospital. Mom reports that she has witnessed patient have seizure like events in the hospital and she has noticed patient will be more confused after he has one of the events and she thinks that this is also why patient is more agitated.   Mom reports that patient attempted to get an appt 1.5 mon ago with [facility].   FH: Aunt sharon had seizures due to tertiary syphilis Dad- deceased dx w/ Bipolar/ schizophrenia Cancer- Aunt Ivin Booty hx of Breast cancer, M grandfather- unknonw type     Psychiatric History:  Information collected from patient and mother - Hx of Seroquel, Depakote, Abilify, and possibly lamictal -- Dx at 67-11 yo w/ bipolar ? After death of brother -- Was previously seen at [facility] history:  - M grand Aunt- tertiary syphilis w/ seizures and hx of SA - Father: Bipolar/ Schizophrenia   Social History:   Tobacco use: 1ppd Alcohol use:  Per om patient was drinking 3, 24oz of Earthquake and started drinking 1/ day a month ago Drug use: Cocaine intermittently  Family History:   The patient's family history includes Cancer in his maternal grandfather;  Hypertension in his mother.  Medical History: Past Medical History:  Diagnosis Date   Asthma    HTN (hypertension)     Surgical History: No past surgical history on file.  Medications:   Current Facility-Administered Medications:    acetaminophen (TYLENOL) 160 MG/5ML solution 650 mg, 650 mg, Oral, Q6H PRN, Little Ishikawa, MD   acetaminophen (TYLENOL) tablet 650 mg, 650 mg, Oral, Q6H PRN, Little Ishikawa, MD   albuterol (PROVENTIL) (2.5 MG/3ML) 0.083% nebulizer solution 3 mL, 3 mL, Inhalation, Q4H PRN, Collier Bullock, MD   cetaphil lotion, , Topical, Daily, Candie Chroman, Mila Palmer, MD   Chlorhexidine Gluconate Cloth 2 % PADS 6 each, 6 each, Topical, Q0600, Little Ishikawa, MD, 6 each at 03/08/22 0813   docusate sodium (COLACE) capsule 100 mg, 100 mg, Oral, BID PRN, Collier Bullock, MD, 100 mg at 27/78/24 2353   folic acid injection 1 mg, 1 mg, Intravenous, Daily, Spero Geralds, MD, 1 mg at 03/07/22 1006   food thickener (SIMPLYTHICK (NECTAR/LEVEL 2/MILDLY THICK)) 10 packet, 10 packet, Oral, PRN, Little Ishikawa, MD   levETIRAcetam (KEPPRA) IVPB 500 mg/100 mL premix, 500 mg, Intravenous, Q12H, Donnetta Simpers, MD, Last Rate: 400 mL/hr at 03/08/22 0817, 500 mg at 03/08/22 0817   lip balm (BLISTEX) ointment, , Topical, PRN, Damita Dunnings B, MD   midazolam (VERSED) injection 1-2 mg, 1-2 mg, Intravenous, Q1H PRN, Collier Bullock, MD, 2 mg at 03/07/22 1722   mometasone-formoterol (DULERA) 100-5 MCG/ACT inhaler 2 puff, 2 puff, Inhalation, BID, Collier Bullock, MD, 2 puff at 03/08/22 0717   multivitamin with minerals tablet 1 tablet, 1 tablet, Oral, Daily, Collier Bullock, MD, 1 tablet at 03/08/22 0814   OLANZapine (ZYPREXA) tablet 5 mg, 5 mg, Oral, QHS, Little Ishikawa, MD, 5 mg at 03/07/22 2208   Oral care mouth rinse, 15 mL, Mouth Rinse, 4 times per day, Spero Geralds, MD, 15 mL at 03/08/22 0818   Oral care mouth rinse, 15 mL, Mouth Rinse, PRN, Spero Geralds,  MD   pantoprazole (PROTONIX) injection 40 mg, 40 mg, Intravenous, Q24H, Little Ishikawa, MD, 40 mg at 03/08/22 0813   polyethylene glycol (MIRALAX / GLYCOLAX) packet 17 g, 17 g, Oral, Daily PRN, Collier Bullock, MD   potassium chloride SA (KLOR-CON M) CR tablet 40 mEq, 40 mEq,  Oral, Q4H, Little Ishikawa, MD, 40 mEq at 03/08/22 9211   thiamine (B-1) injection 100 mg, 100 mg, Intravenous, Daily, Spero Geralds, MD, 100 mg at 03/08/22 0813  Allergies: Allergies  Allergen Reactions   Ibuprofen Anaphylaxis       Objective  Vital signs:  Temp:  [97.9 F (36.6 C)-98.6 F (37 C)] 98.2 F (36.8 C) (07/04 0740) Pulse Rate:  [54-111] 66 (07/04 0740) Resp:  [16-18] 18 (07/04 0740) BP: (129-152)/(92-103) 147/103 (07/04 0740) SpO2:  [90 %-100 %] 100 % (07/04 0740) Weight:  [57.1 kg] 57.1 kg (07/04 0425)  Psychiatric Specialty Exam:  Presentation  General Appearance: -- (resting in bed in wrist restraints getting EEG's monitors placed in his head)  Eye Contact:Fair (intermittent episodes where  he would look off and not respond for a few seconds)  Speech:Slurred (fluctated becoming more and less clear, also perserverative speech at times.)  Speech Volume:Normal  Handedness:No data recorded  Mood and Affect  Mood:Euthymic  Affect:Appropriate   Thought Process  Thought Processes:Goal Directed  Descriptions of Associations:Circumstantial  Orientation:Partial (fluctuating throughout assessment)  Thought Content:Logical  History of Schizophrenia/Schizoaffective disorder:No data recorded Duration of Psychotic Symptoms:No data recorded Hallucinations:Hallucinations: None  Ideas of Reference:None  Suicidal Thoughts:Suicidal Thoughts: No  Homicidal Thoughts:Homicidal Thoughts: No   Sensorium  Memory:Immediate Fair  Judgment:-- (Improving)  Insight:Fair   Executive Functions  Concentration:Poor  Attention Span:Fair  Recall:No data recorded Fund of  Knowledge:Fair  Language:Fair   Psychomotor Activity  Psychomotor Activity:Psychomotor Activity: Decreased   Assets  Assets:Resilience; Desire for Improvement; Housing; Intimacy; Social Support   Sleep  Sleep:Sleep: Fair    Physical Exam: Physical Exam HENT:     Head: Normocephalic and atraumatic.  Pulmonary:     Effort: Pulmonary effort is normal.  Skin:    General: Skin is dry.  Neurological:     Mental Status: He is alert.     Comments: Fluctuating orientation    Review of Systems  Musculoskeletal:  Positive for myalgias.  Psychiatric/Behavioral:  Negative for hallucinations and suicidal ideas.    Blood pressure (!) 147/103, pulse 66, temperature 98.2 F (36.8 C), resp. rate 18, height '5\' 4"'$  (1.626 m), weight 57.1 kg, SpO2 100 %. Body mass index is 21.61 kg/m.

## 2022-03-08 NOTE — Progress Notes (Signed)
vLTM started  all impedances below 10kohms   [facility] to monitor   Patient event button tested 

## 2022-03-09 DIAGNOSIS — R569 Unspecified convulsions: Secondary | ICD-10-CM | POA: Diagnosis not present

## 2022-03-09 DIAGNOSIS — G9340 Encephalopathy, unspecified: Secondary | ICD-10-CM | POA: Diagnosis not present

## 2022-03-09 LAB — COMPREHENSIVE METABOLIC PANEL
ALT: 19 U/L (ref 0–44)
AST: 19 U/L (ref 15–41)
Albumin: 2.4 g/dL — ABNORMAL LOW (ref 3.5–5.0)
Alkaline Phosphatase: 48 U/L (ref 38–126)
Anion gap: 6 (ref 5–15)
BUN: 6 mg/dL (ref 6–20)
CO2: 19 mmol/L — ABNORMAL LOW (ref 22–32)
Calcium: 7.7 mg/dL — ABNORMAL LOW (ref 8.9–10.3)
Chloride: 116 mmol/L — ABNORMAL HIGH (ref 98–111)
Creatinine, Ser: 0.54 mg/dL — ABNORMAL LOW (ref 0.61–1.24)
GFR, Estimated: >60 mL/min (ref >60)
Glucose, Bld: 88 mg/dL (ref 70–99)
Potassium: 3.3 mmol/L — ABNORMAL LOW (ref 3.5–5.1)
Sodium: 141 mmol/L (ref 135–145)
Total Bilirubin: 0.8 mg/dL (ref 0.3–1.2)
Total Protein: 4.8 g/dL — ABNORMAL LOW (ref 6.5–8.1)

## 2022-03-09 LAB — CBC
HCT: 28.1 % — ABNORMAL LOW (ref 39.0–52.0)
Hemoglobin: 8.5 g/dL — ABNORMAL LOW (ref 13.0–17.0)
MCH: 24.9 pg — ABNORMAL LOW (ref 26.0–34.0)
MCHC: 30.2 g/dL (ref 30.0–36.0)
MCV: 82.2 fL (ref 80.0–100.0)
Platelets: 198 10*3/uL (ref 150–400)
RBC: 3.42 MIL/uL — ABNORMAL LOW (ref 4.22–5.81)
RDW: 22.5 % — ABNORMAL HIGH (ref 11.5–15.5)
WBC: 8 10*3/uL (ref 4.0–10.5)
nRBC: 0 % (ref 0.0–0.2)

## 2022-03-09 LAB — HSV CULTURE AND TYPING

## 2022-03-09 MED ORDER — POTASSIUM CHLORIDE CRYS ER 20 MEQ PO TBCR
40.0000 meq | EXTENDED_RELEASE_TABLET | ORAL | Status: AC
  Administered 2022-03-09 (×2): 40 meq via ORAL
  Filled 2022-03-09 (×2): qty 2

## 2022-03-09 MED ORDER — DIVALPROEX SODIUM 250 MG PO DR TAB
500.0000 mg | DELAYED_RELEASE_TABLET | Freq: Two times a day (BID) | ORAL | Status: DC
  Administered 2022-03-09 – 2022-03-10 (×3): 500 mg via ORAL
  Filled 2022-03-09 (×3): qty 2

## 2022-03-09 NOTE — Progress Notes (Signed)
Occupational Therapy Treatment Patient Details Name: Timothy Hubbard MRN: [id number] DOB: 08-Jan-1991 Today's Date: 03/09/2022   History of present illness [name] is a 31 y/o man admitted secondary to multiple seizure like episodes and AMS. He sustained a fall at home, denied hitting head, with teeth grinding and apparent seizure activity for 7 min.  Was altered, lethargic and confused afterwards.  Additional seizure about 1 hour later, and another 1 hr later.  Brought to the ED following the third episode. PMH including but not limited to hx of HTN, asthma, GERD, ETOH use.   OT comments  Pt progressing towards goals this session, of note has had seizure like activity yesterday, on EEG during session. Pt min -mod A for BSC transfer, UB dressing, and pericare. Pt with decreased ability to grasp washcloth for pericare, states "he's weak". Pt oriented to self, place, and time this session, following commands well and did not demonstrate aggressive/inappropriate behavior this session. Pt presenting with impairments listed below, will follow acutely. Continue to recommend AIR at d/c.   Recommendations for follow up therapy are one component of a multi-disciplinary discharge planning process, led by the attending physician.  Recommendations may be updated based on patient status, additional functional criteria and insurance authorization.    Follow Up Recommendations  Acute inpatient rehab (3hours/day)    Assistance Recommended at Discharge Frequent or constant Supervision/Assistance  Patient can return home with the following  A lot of help with walking and/or transfers;A lot of help with bathing/dressing/bathroom;Direct supervision/assist for medications management;Assistance with cooking/housework;Assistance with feeding;Direct supervision/assist for financial management;Assist for transportation;Help with stairs or ramp for entrance   Equipment Recommendations  None recommended by OT;Other  (comment) (defer)    Recommendations for Other Services Rehab consult    Precautions / Restrictions Precautions Precautions: Fall;Other (comment) Precaution Comments: seizure Restrictions Weight Bearing Restrictions: No       Mobility Bed Mobility Overal bed mobility: Needs Assistance Bed Mobility: Supine to Sit     Supine to sit: Min guard          Transfers Overall transfer level: Needs assistance Equipment used: 2 person hand held assist Transfers: Sit to/from Stand, Bed to chair/wheelchair/BSC Sit to Stand: Min assist, +2 safety/equipment Stand pivot transfers: Min assist, +2 safety/equipment         General transfer comment: assistance for stability, safety and line management; pt able to complete one sit<>stand from EOB, pivot towards his left side to the [facility] and then one sit<>stand from [facility]     Balance Overall balance assessment: Needs assistance Sitting-balance support: Feet supported Sitting balance-Leahy Scale: Fair     Standing balance support: Bilateral upper extremity supported Standing balance-Leahy Scale: Fair                             ADL either performed or assessed with clinical judgement   ADL Overall ADL's : Needs assistance/impaired                 Upper Body Dressing : Minimal assistance;Bed level Upper Body Dressing Details (indicate cue type and reason): to don gown     Toilet Transfer: Stand-pivot;Ambulation;Minimal assistance;+2 for safety/equipment Toilet Transfer Details (indicate cue type and reason): to [place] and Hygiene: Moderate assistance;Sit to/from stand Toileting - Clothing Manipulation Details (indicate cue type and reason): for pericare       General ADL Comments: less impulsivity noted this session    Extremity/Trunk  Assessment Upper Extremity Assessment Upper Extremity Assessment: Generalized weakness (impaired fine motor)   Lower Extremity Assessment Lower  Extremity Assessment: Defer to PT evaluation        Vision   Vision Assessment?: No apparent visual deficits   Perception Perception Perception: Not tested   Praxis Praxis Praxis: Not tested    Cognition Arousal/Alertness: Awake/alert Behavior During Therapy: Impulsive Overall Cognitive Status: Impaired/Different from baseline Area of Impairment: Following commands, Safety/judgement, Awareness, Problem solving                       Following Commands: Follows one step commands with increased time Safety/Judgement: Decreased awareness of deficits, Decreased awareness of safety Awareness: Emergent ("states need to go use bathroom") Problem Solving: Slow processing, Decreased initiation, Difficulty sequencing, Requires verbal cues, Requires tactile cues General Comments: Decreased safety awareness and deficit awareness; states he is in [place], but tangential and needs assist to redirect        Exercises      Shoulder Instructions       General Comments VSS on RA    Pertinent Vitals/ Pain       Pain Assessment Pain Assessment: Faces Pain Location: back Pain Descriptors / Indicators: Sore Pain Intervention(s): Limited activity within patient's tolerance, Monitored during session, Repositioned  Home Living                                          Prior Functioning/Environment              Frequency  Min 2X/week        Progress Toward Goals  OT Goals(current goals can now be found in the care plan section)  Progress towards OT goals: Progressing toward goals  Acute Rehab OT Goals Patient Stated Goal: to eat breakfast OT Goal Formulation: With patient Time For Goal Achievement: 03/18/22 Potential to Achieve Goals: Good ADL Goals Pt Will Perform Eating: with modified independence;with adaptive utensils;sitting Pt Will Perform Grooming: with modified independence;with adaptive equipment;sitting Pt Will Perform Lower Body  Bathing: with modified independence;sitting/lateral leans;sit to/from stand Pt Will Perform Lower Body Dressing: with modified independence;sitting/lateral leans;sit to/from stand Pt Will Transfer to Toilet: with modified independence;ambulating Pt Will Perform Toileting - Clothing Manipulation and hygiene: with modified independence;sitting/lateral leans;sit to/from stand  Plan Discharge plan remains appropriate;Frequency remains appropriate    Co-evaluation    PT/OT/SLP Co-Evaluation/Treatment: Yes Reason for Co-Treatment: For patient/therapist safety;To address functional/ADL transfers PT goals addressed during session: Mobility/safety with mobility;Balance;Strengthening/ROM OT goals addressed during session: ADL's and self-care      AM-PAC OT "6 Clicks" Daily Activity     Outcome Measure   Help from another person eating meals?: A Little Help from another person taking care of personal grooming?: A Little Help from another person toileting, which includes using toliet, bedpan, or urinal?: A Little Help from another person bathing (including washing, rinsing, drying)?: A Lot Help from another person to put on and taking off regular upper body clothing?: A Little Help from another person to put on and taking off regular lower body clothing?: A Lot 6 Click Score: 16    End of Session Equipment Utilized During Treatment: Gait belt  OT Visit Diagnosis: Unsteadiness on feet (R26.81);Other abnormalities of gait and mobility (R26.89);Muscle weakness (generalized) (M62.81);Other symptoms and signs involving the nervous system (R29.898)   Activity Tolerance Patient tolerated treatment well  Patient Left in chair;with call bell/phone within reach;with chair alarm set (posey belt)   Nurse Communication          Time: [phone] OT Time Calculation (min): 25 min  Charges: OT General Charges $OT Visit: 1 Visit OT Treatments $Self Care/Home Management : 8-22 mins  Lynnda Child,  OTD, OTR/L Acute Rehab [phone]) [phone]   Kaylyn Lim 03/09/2022, 9:17 AM

## 2022-03-09 NOTE — Progress Notes (Signed)
Inpatient Rehab Admissions Coordinator:   Pt still with LTM.  Will f/u at bedside tomorrow.   Shann Medal, PT, DPT Admissions Coordinator [phone] 03/09/22  10:29 AM

## 2022-03-09 NOTE — Progress Notes (Signed)
Physical Therapy Treatment Patient Details Name: Timothy Hubbard MRN: [id number] DOB: 24-Apr-1991 Today's Date: 03/09/2022   History of Present Illness [name] is a 31 y/o man admitted secondary to multiple seizure like episodes and AMS. He sustained a fall at home, denied hitting head, with teeth grinding and apparent seizure activity for 7 min.  Was altered, lethargic and confused afterwards.  Additional seizure about 1 hour later, and another 1 hr later.  Brought to the ED following the third episode. PMH including but not limited to hx of HTN, asthma, GERD, ETOH use.    PT Comments    Pt making fair progress overall, limited this session due to being connected to continuous EEG monitor. He did not demonstrate any combative or aggressive behaviors today. He was very pleasant and agreeable throughout. He was able to complete transfers with min A x2 for safety and ambulate a short distance with 2HHA and min A x2 for stability and line management. Continue to recommend that pt d/c to AIR when medically appropriate to maximize his safety and independence with functional mobility. PT will continue to follow acutely to progress mobility as tolerated.   Recommendations for follow up therapy are one component of a multi-disciplinary discharge planning process, led by the attending physician.  Recommendations may be updated based on patient status, additional functional criteria and insurance authorization.  Follow Up Recommendations  Acute inpatient rehab (3hours/day)     Assistance Recommended at Discharge Frequent or constant Supervision/Assistance  Patient can return home with the following A lot of help with walking and/or transfers;A lot of help with bathing/dressing/bathroom;Assist for transportation;Help with stairs or ramp for entrance;Assistance with cooking/housework;Direct supervision/assist for medications management   Equipment Recommendations  Rolling walker (2 wheels)     Recommendations for Other Services       Precautions / Restrictions Precautions Precautions: Fall;Other (comment) Precaution Comments: seizure Restrictions Weight Bearing Restrictions: No     Mobility  Bed Mobility Overal bed mobility: Needs Assistance Bed Mobility: Supine to Sit     Supine to sit: Min guard     General bed mobility comments: for safety; able to achieve an upright sitting position at EOB towards his R side with use of bed rails    Transfers Overall transfer level: Needs assistance Equipment used: 2 person hand held assist Transfers: Sit to/from Stand, Bed to chair/wheelchair/BSC Sit to Stand: Min assist, +2 safety/equipment Stand pivot transfers: Min assist, +2 safety/equipment         General transfer comment: assistance for stability, safety and line management; pt able to complete one sit<>stand from EOB, pivot towards his left side to the [facility] and then one sit<>stand from [facility]    Ambulation/Gait Ambulation/Gait assistance: Min assist, +2 safety/equipment Gait Distance (Feet): 5 Feet Assistive device: 2 person hand held assist Gait Pattern/deviations: Step-through pattern, Decreased stride length Gait velocity: decreased     General Gait Details: pt able to ambulate a short distance from [facility] to recliner chair (limited secondary to contiuous EEG lines/monitoring); very unsteady but able to safely manage with min A x2   Stairs             Wheelchair Mobility    Modified Rankin (Stroke Patients Only)       Balance Overall balance assessment: Needs assistance Sitting-balance support: Feet supported Sitting balance-Leahy Scale: Fair     Standing balance support: Bilateral upper extremity supported Standing balance-Leahy Scale: Poor  Cognition Arousal/Alertness: Awake/alert Behavior During Therapy: Impulsive Overall Cognitive Status: Impaired/Different from baseline Area of Impairment:  Following commands, Safety/judgement, Awareness, Problem solving                       Following Commands: Follows one step commands with increased time Safety/Judgement: Decreased awareness of deficits, Decreased awareness of safety Awareness: Emergent Problem Solving: Slow processing, Decreased initiation, Difficulty sequencing, Requires verbal cues, Requires tactile cues          Exercises      General Comments        Pertinent Vitals/Pain Pain Assessment Pain Assessment: Faces Faces Pain Scale: Hurts a little bit Pain Location: back Pain Descriptors / Indicators: Sore Pain Intervention(s): Monitored during session    Home Living                          Prior Function            PT Goals (current goals can now be found in the care plan section) Acute Rehab PT Goals PT Goal Formulation: With patient Time For Goal Achievement: 03/18/22 Potential to Achieve Goals: Good Progress towards PT goals: Progressing toward goals    Frequency    Min 3X/week      PT Plan Current plan remains appropriate    Co-evaluation PT/OT/SLP Co-Evaluation/Treatment: Yes Reason for Co-Treatment: For patient/therapist safety PT goals addressed during session: Mobility/safety with mobility;Balance;Strengthening/ROM        AM-PAC PT "6 Clicks" Mobility   Outcome Measure  Help needed turning from your back to your side while in a flat bed without using bedrails?: A Little Help needed moving from lying on your back to sitting on the side of a flat bed without using bedrails?: A Little Help needed moving to and from a bed to a chair (including a wheelchair)?: A Lot Help needed standing up from a chair using your arms (e.g., wheelchair or bedside chair)?: A Lot Help needed to walk in hospital room?: A Lot Help needed climbing 3-5 steps with a railing? : A Lot 6 Click Score: 14    End of Session   Activity Tolerance: Patient tolerated treatment well Patient  left: in chair;with call bell/phone within reach;with chair alarm set;Other (comment) (Posey belt in place) Nurse Communication: Mobility status PT Visit Diagnosis: Unsteadiness on feet (R26.81);Other abnormalities of gait and mobility (R26.89);Muscle weakness (generalized) (M62.81);Difficulty in walking, not elsewhere classified (R26.2)     Time: [id number] PT Time Calculation (min) (ACUTE ONLY): 32 min  Charges:  $Therapeutic Activity: 8-22 mins                     Anastasio Champion, DPT  Acute Rehabilitation Services Office [place] 03/09/2022, 9:00 AM

## 2022-03-09 NOTE — Progress Notes (Signed)
PROGRESS NOTE   Timothy Hubbard  NFA:[id number] DOB: Feb 11, 1991 DOA: 03/01/2022 PCP: Alcus Dad, MD   Brief Narrative:  Timothy Hubbard is a 31 yo man with a hx of HTN, asthma, GERD, ETOH use, here after multiple seizure like episodes and AMS.  He was previously noted to be normal per family, then came out of the bathroom stumbling and fell, without notable trauma. Then had teeth grinding and apparent seizure activity for 7 min.  Was altered, lethargic and confused afterwards. Additional seizure about 1 hour later, and another 1 hr later.  Brought to the ED following the third episode.    Typically non compliant with medications intermittently at baseline - no current active home medications listed on med reconciliation. Per his mother, he has been acting and feeling normal as far as she knows (he doesn't complain to her much and keeps his medical issues private from her).  Some R side pain recently. No hx of etoh withdrawal.    6/28 admitted to ICU with new onset sz activity, LTM, protecting his airway 6/29 no seizure on LTM, head imaging negative, still dysarthric but improving 6/30 Stable transfer to medical floor - requesting IV narcotics (previously on in ICU) which we discussed were not going to be replaced for his chronic lumbago. Will continue OTC medications PRN. 7/1 Fall overnight trying to get out of bed without calling for help - generally being noncompliant. Pulling out IV multiple times - requiring blood transfusion this am.  7/2 -hemoglobin improving, worsening mental status over the last 24 hours, noncompliant today with medications, attempting to remove IV, somewhat combative with staff, restraints were placed 7/3 Mental status remains labile - consulted Psych - start Zyprexa today, remains quite altered today without clear etiology **7/3 update - patient having atypical seizure-like-activity now that family is present. Initially a 'grandmal' appearing episode resolving prior to staff  arrival - midazolam administered. Second 'seizure' was patient simply slapping single arm against bed - upon staff arrival he was back to his previous baseline (although not back to usual baseline per mother this is his LOC over the past 72h).  7/4 EEG initiated --> restraints replaced overnight due to patient noncompliance with procedure. Psych felt his abnormal behavior was a metabolic issue - no meds prescribed. 7/5 EEG "Two events were noted on 03/08/2022 at 0958 as described above without concomitant EEG change.  This was not an epileptic event" - patient more appropriate today, alert to person/general situation only. Still abnormal speech pattern/thought process. Psych to reassess after further discussion about ongoing abnormal behavior per Mother.  Assessment & Plan:   Principal Problem:   Encephalopathy acute Active Problems:   TOBACCO USER   Essential hypertension   Asthma in adult, moderate persistent, with acute exacerbation   Gastroesophageal reflux disease   Alcohol use disorder   Fever in adult   Seizure (HCC)   Hypokalemia   Iron deficiency anemia  New onset seizure activity ? Provoked secondary to polypharmacy/etoh cessation Without signs/symptoms of withdrawals currently. Chronic ETOH use /abuse Acute Encephalopathy, improving but not back to baseline -Etoh intake cutdown recently (unclear if drastic cut or cessation) -regardless he is outside the window for withdrawals at this point - Low suspicion meningitis based on CSF results, procal low - CTA and MRI brain unremarkable - Neuro following - EEG unremarkable- Transition from keppra to depakote today - UDS positive for cocaine and cannabinoids(benzos likely given in the field during seizure) -polypharmacy likely playing a role in his mental  status and behavior -Mental status fluctuating, somewhat combative with staff today restraints were placed -Start zyprexa - psych indicates this mental status/behavior change is  metabolic in nature, subsequently signed off. -TSH WNL - Continue thiamine, folic acid and MV supplementation given Etoh history.  Ambulatory dysfunction  -Multifactorial, secondary to above, continue to follow with PT OT -Remains unsafe disposition home at this time given weakness and unclear support at home with mother/father  Dysphagia -Likely secondary to acute siezure, follows with speech  Hypokalemia Hypomagnesemia In the setting of chronic alcohol abuse/malnutrition -Replete as appropriate, follow am labs   Iron deficiency anemia Rule out acute blood loss anemia Hgb drifting down, 8.7->8.0->7.3-->6.3 --> 9.4 (post 2u transfusion)->8.8-->8.5 Fe stores low, possibly secondary to malnutrition Occult card pending 2 unit PRBC 7/1  HTN -Blood pressure increasing -No listed home medications, will start low-dose amlodipine and follow clinically -Currently well controlled   GERD -continue protonix.    Asthma, tobacco use -prn nebs, not currently wheezing  Incorrect/mis-documented Bipolar/Schizophrenia  - Unable to find this documented diagnosis in the chart -Psych sidelined, they are unable to find any previous documentation or medications consistent with these diagnoses -Unclear why these are included in the chart but these will need to be corrected prior to discharge  DVT prophylaxis: Lovenox Code Status: Full Family Communication: Mother updated over phone  Status is: Inpt  Dispo: The patient is from: Home              Anticipated d/c is to: Home              Anticipated d/c date is: 48-72h              Patient currently NOT medically stable for discharge  Consultants:  PCCM, Neuro, Psych  Procedures:  None  Antimicrobials:  None   Subjective: No acute issues or events overnight, mental status continues to be quite labile, EEG sensors being placed this am - patient sitting up in bedside chair - currently out of restraints.  Objective: Vitals:   03/08/22  1827 03/08/22 1950 03/08/22 2328 03/09/22 0510  BP: 131/82 133/87 127/90 121/85  Pulse: 98 61 92 84  Resp: '14 16 18 18  '$ Temp: 99.4 F (37.4 C) 98.3 F (36.8 C) 98.6 F (37 C) 98.8 F (37.1 C)  TempSrc: Oral Oral Oral Oral  SpO2: 92% 90% 100% 100%  Weight:      Height:        Intake/Output Summary (Last 24 hours) at 03/09/2022 0734 Last data filed at 03/09/2022 0600 Gross per 24 hour  Intake 920 ml  Output 1250 ml  Net -330 ml    Filed Weights   03/04/22 0524 03/05/22 0500 03/08/22 0425  Weight: 61.3 kg 61.7 kg 57.1 kg    Examination:  General exam: Appears calm and comfortable, oriented to person/place only Respiratory system: Clear to auscultation. Respiratory effort normal. Cardiovascular system: S1 & S2 heard, RRR. No JVD, murmurs, rubs, gallops or clicks. No pedal edema. Gastrointestinal system: Abdomen is nondistended, soft and nontender. No organomegaly or masses felt. Normal bowel sounds heard. Extremities: Symmetric 5 x 5 power. Skin: No rashes, lesions or ulcers Psychiatry: Poor judgement and insight, mood & affect labile   Data Reviewed: I have personally reviewed following labs and imaging studies  CBC: Recent Labs  Lab 03/05/22 0358 03/05/22 0645 03/06/22 0310 03/07/22 0255 03/08/22 0039 03/09/22 0124  WBC 5.6  --  8.7 9.3 9.1 8.0  HGB 6.5* 6.3* 9.4* 9.3* 8.8* 8.5*  HCT 21.7* 21.4* 29.1* 31.0* 28.9* 28.1*  MCV 78.6*  --  78.9* 79.7* 81.2 82.2  PLT 175  --  186 195 191 161    Basic Metabolic Panel: Recent Labs  Lab 03/03/22 0842 03/04/22 0901 03/05/22 0358 03/06/22 0310 03/07/22 0255 03/08/22 0039 03/09/22 0124  NA 140   < > 142 141 142 142 141  K 2.8*   < > 3.1* 3.3* 3.1* 3.1* 3.3*  CL 105   < > 111 114* 114* 114* 116*  CO2 23   < > 19* 19* 20* 20* 19*  GLUCOSE 101*   < > 100* 89 85 93 88  BUN 7   < > <5* <5* <5* <5* 6  CREATININE 0.70   < > 0.66 0.68 0.63 0.66 0.54*  CALCIUM 8.4*   < > 8.2* 8.4* 8.2* 7.8* 7.7*  MG 1.9  --   --   --    --   --   --   PHOS 3.1  --   --   --   --   --   --    < > = values in this interval not displayed.    GFR: Estimated Creatinine Clearance: 107.1 mL/min (A) (by C-G formula based on SCr of 0.54 mg/dL (L)). Liver Function Tests: Recent Labs  Lab 03/08/22 0039 03/09/22 0124  AST 25 19  ALT 23 19  ALKPHOS 48 48  BILITOT 1.1 0.8  PROT 4.7* 4.8*  ALBUMIN 2.5* 2.4*    No results for input(s): "LIPASE", "AMYLASE" in the last 168 hours. No results for input(s): "AMMONIA" in the last 168 hours.  Coagulation Profile: No results for input(s): "INR", "PROTIME" in the last 168 hours.  Cardiac Enzymes: No results for input(s): "CKTOTAL", "CKMB", "CKMBINDEX", "TROPONINI" in the last 168 hours. BNP (last 3 results) No results for input(s): "PROBNP" in the last 8760 hours. HbA1C: No results for input(s): "HGBA1C" in the last 72 hours.  CBG: Recent Labs  Lab 03/03/22 0717 03/03/22 1209 03/03/22 1547 03/04/22 0839 03/04/22 1156  GLUCAP 104* 103* 105* 115* 145*    Lipid Profile: No results for input(s): "CHOL", "HDL", "LDLCALC", "TRIG", "CHOLHDL", "LDLDIRECT" in the last 72 hours. Thyroid Function Tests: No results for input(s): "TSH", "T4TOTAL", "FREET4", "T3FREE", "THYROIDAB" in the last 72 hours.  Anemia Panel: No results for input(s): "VITAMINB12", "FOLATE", "FERRITIN", "TIBC", "IRON", "RETICCTPCT" in the last 72 hours.  Sepsis Labs: No results for input(s): "PROCALCITON", "LATICACIDVEN" in the last 168 hours.   Recent Results (from the past 240 hour(s))  Resp Panel by RT-PCR (Flu A&B, Covid) Anterior Nasal Swab     Status: None   Collection Time: 03/01/22 12:48 AM   Specimen: Anterior Nasal Swab  Result Value Ref Range Status   SARS Coronavirus 2 by RT PCR NEGATIVE NEGATIVE Final    Comment: (NOTE) SARS-CoV-2 target nucleic acids are NOT DETECTED.  The SARS-CoV-2 RNA is generally detectable in upper respiratory specimens during the acute phase of infection. The  lowest concentration of SARS-CoV-2 viral copies this assay can detect is 138 copies/mL. A negative result does not preclude SARS-Cov-2 infection and should not be used as the sole basis for treatment or other patient management decisions. A negative result may occur with  improper specimen collection/handling, submission of specimen other than nasopharyngeal swab, presence of viral mutation(s) within the areas targeted by this assay, and inadequate number of viral copies(<138 copies/mL). A negative result must be combined with clinical observations, patient history, and epidemiological information.  The expected result is Negative.  Fact Sheet for Patients:  EntrepreneurPulse.com.au  Fact Sheet for Healthcare Providers:  IncredibleEmployment.be  This test is no t yet approved or cleared by the [place] FDA and  has been authorized for detection and/or diagnosis of SARS-CoV-2 by FDA under an Emergency Use Authorization (EUA). This EUA will remain  in effect (meaning this test can be used) for the duration of the COVID-19 declaration under Section 564(b)(1) of the Act, 21 U.S.C.section 360bbb-3(b)(1), unless the authorization is terminated  or revoked sooner.       Influenza A by PCR NEGATIVE NEGATIVE Final   Influenza B by PCR NEGATIVE NEGATIVE Final    Comment: (NOTE) The Xpert Xpress SARS-CoV-2/FLU/RSV plus assay is intended as an aid in the diagnosis of influenza from Nasopharyngeal swab specimens and should not be used as a sole basis for treatment. Nasal washings and aspirates are unacceptable for Xpert Xpress SARS-CoV-2/FLU/RSV testing.  Fact Sheet for Patients: EntrepreneurPulse.com.au  Fact Sheet for Healthcare Providers: IncredibleEmployment.be  This test is not yet approved or cleared by the [place] FDA and has been authorized for detection and/or diagnosis of SARS-CoV-2 by FDA under  an Emergency Use Authorization (EUA). This EUA will remain in effect (meaning this test can be used) for the duration of the COVID-19 declaration under Section 564(b)(1) of the Act, 21 U.S.C. section 360bbb-3(b)(1), unless the authorization is terminated or revoked.  Performed at [facility], [place] [address], [place], [place] [address]   Blood Culture (routine x 2)     Status: None   Collection Time: 03/01/22  9:54 PM   Specimen: BLOOD  Result Value Ref Range Status   Specimen Description   Final    BLOOD RIGHT ANTECUBITAL Performed at [facility] [address], [place], [place] [address]    Special Requests   Final    BOTTLES DRAWN AEROBIC AND ANAEROBIC Blood Culture adequate volume Performed at [facility] [address] [place], [place] [address]    Culture   Final    NO GROWTH 5 DAYS Performed at [facility], [place] [address] [place], [place] [address]    Report Status 03/07/2022 FINAL  Final  Blood Culture (routine x 2)     Status: Abnormal   Collection Time: 03/01/22  9:57 PM   Specimen: BLOOD  Result Value Ref Range Status   Specimen Description   Final    BLOOD LEFT ANTECUBITAL Performed at [facility] [address] [place], [place] [address]    Special Requests   Final    BOTTLES DRAWN AEROBIC AND ANAEROBIC Blood Culture adequate volume Performed at [facility] [address], [place], [place] [address]    Culture  Setup Time   Final    GRAM POSITIVE COCCI IN CLUSTERS AEROBIC BOTTLE ONLY Organism ID to follow CRITICAL RESULT CALLED TO, READ BACK BY AND VERIFIED WITH:  C/ PHARMD ELIZABETH M. 03/04/22 1115 A. LAFRANCE    Culture (A)  Final    STAPHYLOCOCCUS AURICULARIS THE SIGNIFICANCE OF ISOLATING THIS ORGANISM FROM A SINGLE SET OF BLOOD CULTURES WHEN MULTIPLE SETS ARE DRAWN IS UNCERTAIN. PLEASE NOTIFY THE MICROBIOLOGY DEPARTMENT WITHIN ONE WEEK IF  SPECIATION AND SENSITIVITIES ARE REQUIRED. Performed at [facility], [place] [address] [place], [place] [address]    Report Status 03/06/2022 FINAL  Final  Blood Culture ID Panel (Reflexed)     Status: Abnormal   Collection  Time: 03/01/22  9:57 PM  Result Value Ref Range Status   Enterococcus faecalis NOT DETECTED NOT DETECTED Final   Enterococcus Faecium NOT DETECTED NOT DETECTED Final   Listeria monocytogenes NOT DETECTED NOT DETECTED Final   Staphylococcus species DETECTED (A) NOT DETECTED Final    Comment: CRITICAL RESULT CALLED TO, READ BACK BY AND VERIFIED WITH:  C/ PHARMD ELIZABETH M. 03/04/22 1115 A. LAFRANCE    Staphylococcus aureus (BCID) NOT DETECTED NOT DETECTED Final   Staphylococcus epidermidis NOT DETECTED NOT DETECTED Final   Staphylococcus lugdunensis NOT DETECTED NOT DETECTED Final   Streptococcus species NOT DETECTED NOT DETECTED Final   Streptococcus agalactiae NOT DETECTED NOT DETECTED Final   Streptococcus pneumoniae NOT DETECTED NOT DETECTED Final   Streptococcus pyogenes NOT DETECTED NOT DETECTED Final   A.calcoaceticus-baumannii NOT DETECTED NOT DETECTED Final   Bacteroides fragilis NOT DETECTED NOT DETECTED Final   Enterobacterales NOT DETECTED NOT DETECTED Final   Enterobacter cloacae complex NOT DETECTED NOT DETECTED Final   Escherichia coli NOT DETECTED NOT DETECTED Final   Klebsiella aerogenes NOT DETECTED NOT DETECTED Final   Klebsiella oxytoca NOT DETECTED NOT DETECTED Final   Klebsiella pneumoniae NOT DETECTED NOT DETECTED Final   Proteus species NOT DETECTED NOT DETECTED Final   Salmonella species NOT DETECTED NOT DETECTED Final   Serratia marcescens NOT DETECTED NOT DETECTED Final   Haemophilus influenzae NOT DETECTED NOT DETECTED Final   Neisseria meningitidis NOT DETECTED NOT DETECTED Final   Pseudomonas aeruginosa NOT DETECTED NOT DETECTED Final   Stenotrophomonas maltophilia NOT DETECTED NOT DETECTED Final   Candida albicans NOT DETECTED  NOT DETECTED Final   Candida auris NOT DETECTED NOT DETECTED Final   Candida glabrata NOT DETECTED NOT DETECTED Final   Candida krusei NOT DETECTED NOT DETECTED Final   Candida parapsilosis NOT DETECTED NOT DETECTED Final   Candida tropicalis NOT DETECTED NOT DETECTED Final   Cryptococcus neoformans/gattii NOT DETECTED NOT DETECTED Final    Comment: Performed at [facility], [address] [place], [place] [address]  CSF culture     Status: None   Collection Time: 03/01/22 11:16 PM   Specimen: Back; Cerebrospinal Fluid  Result Value Ref Range Status   Specimen Description   Final    BACK Performed at [facility] [address], [place], [place] [address]    Special Requests   Final    NONE Performed at [facility], [place] [address], [place], [place] [address]    Gram Stain   Final    NO WBC SEEN NO ORGANISMS SEEN CYTOSPIN SMEAR Gram Stain Report Called to,Read Back By and Verified With: CALLED TO KELSEY,A ON [id number] AT [place]    Culture   Final    NO GROWTH 3 DAYS Performed at [facility], [place] [address] [place], [place] [address]    Report Status 03/05/2022 FINAL  Final  Urine Culture     Status: None   Collection Time: 03/02/22  1:24 AM   Specimen: In/Out Cath Urine  Result Value Ref Range Status   Specimen Description IN/OUT CATH URINE  Final   Special Requests NONE  Final   Culture   Final    NO GROWTH Performed at [facility], [place] [address], [place], [place] [address]    Report Status 03/03/2022 FINAL  Final  MRSA Next Gen by PCR, Nasal     Status: None   Collection Time: 03/02/22  1:33 AM  Specimen: Nasal Mucosa; Nasal Swab  Result Value Ref Range Status   MRSA by PCR Next Gen NOT DETECTED NOT DETECTED Final    Comment: (NOTE) The GeneXpert MRSA Assay (FDA approved for NASAL specimens only), is one component of a comprehensive MRSA colonization surveillance program. It is not intended to  diagnose MRSA infection nor to guide or monitor treatment for MRSA infections. Test performance is not FDA approved in patients less than 51 years old. Performed at [facility], [place] [address] [place], [place] [address]    Radiology Studies: No results found.  Scheduled Meds:  cetaphil   Topical Daily   Chlorhexidine Gluconate Cloth  6 each Topical B2620   folic acid  1 mg Intravenous Daily   mometasone-formoterol  2 puff Inhalation BID   multivitamin with minerals  1 tablet Oral Daily   OLANZapine  5 mg Oral QHS   mouth rinse  15 mL Mouth Rinse 4 times per day   pantoprazole (PROTONIX) IV  40 mg Intravenous Q24H   thiamine injection  100 mg Intravenous Daily   Continuous Infusions:  levETIRAcetam 500 mg (03/08/22 2046)     LOS: 8 days   Time spent: 29mn  Reilynn Lauro C Tameyah Koch, DO [facility]  If 7PM-7AM, please contact night-coverage [email or web address]  03/09/2022, 7:34 AM

## 2022-03-09 NOTE — Procedures (Signed)
MRN: [id number]  Epilepsy Attending: Lora Havens  Referring Physician/Provider: Amie Portland, MD  Duration: 03/08/2022 0919 to 03/09/2022 0919   Patient history: 31 year old man with new onset of multiple seizures. EEG to evaluate for seizure.   Level of alertness: awake, asleep   AEDs during EEG study: LEV   Technical aspects: This EEG study was done with scalp electrodes positioned according to the 10-20 International system of electrode placement. Electrical activity was acquired at a sampling rate of '500Hz'$  and reviewed with a high frequency filter of '70Hz'$  and a low frequency filter of '1Hz'$ . EEG data were recorded continuously and digitally stored.    Description: The posterior dominant rhythm consists of 8-9 Hz activity of moderate voltage (25-35 uV) seen predominantly in posterior head regions, symmetric and reactive to eye opening and eye closing. Sleep was characterized by vertex waves, sleep spindles (12 to 14 Hz), maximal frontocentral region. EEG also showed 15 to 18 Hz beta activity distributed symmetrically and diffusely. Hyperventilation and photic stimulation were not performed.     Patient event was noted on 03/08/2022 at 0944 during which was not responding.  Another similar episode was noted at 0958. This was witnessed by psych team and described as "suddenly stopped talking to me was looking off to the L side. I kept calling his name, and tried to get his attention but he would not respond. Then suddenly he turned back to me, and I asked him where we were located and had to reorient him. Concomitant EEG before but during and after the event showed normal posterior dominant rhythm.   IMPRESSION: This study is within normal limits. No seizures or epileptiform discharges were seen throughout the recording.  Two events were noted on 03/08/2022 at 0958 as described above without concomitant EEG change.  This was not an epileptic event.   Braylee Bosher Barbra Sarks

## 2022-03-09 NOTE — Progress Notes (Addendum)
Subjective: No acute events overnight.  Denies any concerns  ROS: negative except above  Examination  Vital signs in last 24 hours: Temp:  [98.3 F (36.8 C)-99.4 F (37.4 C)] 98.4 F (36.9 C) (07/05 0853) Pulse Rate:  [61-98] 95 (07/05 0853) Resp:  [14-18] 18 (07/05 0853) BP: (121-136)/(40-105) 133/105 (07/05 0853) SpO2:  [90 %-100 %] 90 % (07/05 0853)  General: lying in bed, NAD Neuro: Awake, alert, oriented x 3, no aphasia, speech was dysarthric, able to consistently follow commands, PERRLA, EOMI, no facial asymmetry, spontaneously moving all 4 extremities  Basic Metabolic Panel: Recent Labs  Lab 03/03/22 0842 03/04/22 0901 03/05/22 0358 03/06/22 0310 03/07/22 0255 03/08/22 0039 03/09/22 0124  NA 140   < > 142 141 142 142 141  K 2.8*   < > 3.1* 3.3* 3.1* 3.1* 3.3*  CL 105   < > 111 114* 114* 114* 116*  CO2 23   < > 19* 19* 20* 20* 19*  GLUCOSE 101*   < > 100* 89 85 93 88  BUN 7   < > <5* <5* <5* <5* 6  CREATININE 0.70   < > 0.66 0.68 0.63 0.66 0.54*  CALCIUM 8.4*   < > 8.2* 8.4* 8.2* 7.8* 7.7*  MG 1.9  --   --   --   --   --   --   PHOS 3.1  --   --   --   --   --   --    < > = values in this interval not displayed.    CBC: Recent Labs  Lab 03/05/22 0358 03/05/22 0645 03/06/22 0310 03/07/22 0255 03/08/22 0039 03/09/22 0124  WBC 5.6  --  8.7 9.3 9.1 8.0  HGB 6.5* 6.3* 9.4* 9.3* 8.8* 8.5*  HCT 21.7* 21.4* 29.1* 31.0* 28.9* 28.1*  MCV 78.6*  --  78.9* 79.7* 81.2 82.2  PLT 175  --  186 195 191 198     Coagulation Studies: No results for input(s): "LABPROT", "INR" in the last 72 hours.  Imaging No new brain imaging overnight   ASSESSMENT AND PLAN: 31 year old male with history of substance use disorder who presented with new onset seizures and continues to be aphasic.   New onset seizures Cocaine use disorder Cannabis use disorder Intermittent agitation Acute encephalopathy, toxic-metabolic, resolved -Likely provoked seizure in setting of alcohol  use, cocaine use. - Also noted to have episodes if staring consistent with Non-epileptic event so far   Recommendations - Continue LTM EEG for characterization of spells.  Will DC tomorrow if no further events overnight -Switch Keppra to Depakote 500 mg twice daily to help with behavioral issues -Seizure precautions including do not drive -As needed IV Ativan for clinical seizures -Management of rest of comorbidities per primary team -Follow-up with neurology 4 to 6 weeks. Patient presented with convulsive status epilepticus.  Therefore recommend continuing this for now.  If patient remains seizure-free, this can potentially be weaned off in future.     I have spent a total of  35 minutes with the patient reviewing hospital notes,  test results, labs and examining the patient as well as establishing an assessment and plan that was discussed personally with the patient.  > 50% of time was spent in direct patient care.   Zeb Comfort Epilepsy [facility] For questions after 5pm please refer to AMION to reach the Neurologist on call

## 2022-03-09 NOTE — Progress Notes (Signed)
Speech Language Pathology Treatment: Dysphagia  Patient Details Name: Timothy Hubbard MRN: [id number] DOB: February 13, 1991 Today's Date: 03/09/2022 Time: 8412-8208 SLP Time Calculation (min) (ACUTE ONLY): 11 min  Assessment / Plan / Recommendation Clinical Impression  Per notes and clinical observation, Timothy Hubbard continues to improve re: dysphagia. Thin water, consecutive sips consumed with straw and no cough or throat clearing present over several trials. Oral phase with trial of upgraded texture proved normal and independently used finger sweep to remove residue on dentition. Discussed recommendation or upgraded texture to regular and pt agreed that would be appropriate. Regular texture ordered and no further ST needed.  SLP not following for dysarthria. He reports speech is significantly different since this event. Educated re: strategies to implement to ensure message is clearly relayed and he is getting his needs met and thoughts expressed.    HPI HPI: Timothy Hubbard is a 31 yo man with a hx of HTN, asthma, GERD, ETOH use, here after multiple seizure like episodes and AMS.  Pt not intubated. Failed [facility]. MRI negative.      SLP Plan  All goals met;Discharge SLP treatment due to (comment)      Recommendations for follow up therapy are one component of a multi-disciplinary discharge planning process, led by the attending physician.  Recommendations may be updated based on patient status, additional functional criteria and insurance authorization.    Recommendations  Diet recommendations: Regular;Thin liquid Liquids provided via: Cup;Straw Medication Administration: Whole meds with liquid Supervision: Patient able to self feed Compensations: Slow rate Postural Changes and/or Swallow Maneuvers: Seated upright 90 degrees                Oral Care Recommendations: Oral care BID Follow Up Recommendations: No SLP follow up (no f/u recommended for swallow) Assistance recommended at discharge:  Intermittent Supervision/Assistance SLP Visit Diagnosis: Dysphagia, oropharyngeal phase (R13.12) Plan: All goals met;Discharge SLP treatment due to (comment)           Timothy Hubbard  03/09/2022, 10:39 AM

## 2022-03-09 NOTE — Progress Notes (Signed)
MB maintenance F3, A1, O1, P7, P8 electrodes. All impedances are below 10k ohms. No skin breakdown to note at this time.

## 2022-03-10 ENCOUNTER — Other Ambulatory Visit: Payer: Self-pay

## 2022-03-10 ENCOUNTER — Encounter (HOSPITAL_COMMUNITY): Payer: Self-pay | Admitting: Physical Medicine & Rehabilitation

## 2022-03-10 ENCOUNTER — Inpatient Hospital Stay (HOSPITAL_COMMUNITY)
Admission: RE | Admit: 2022-03-10 | Discharge: 2022-03-19 | DRG: 092 | Disposition: A | Discharge status: Home / Self Care | Payer: Medicaid Other | Source: Intra-hospital | Attending: Physical Medicine & Rehabilitation | Admitting: Physical Medicine & Rehabilitation

## 2022-03-10 DIAGNOSIS — F17290 Nicotine dependence, other tobacco product, uncomplicated: Secondary | ICD-10-CM | POA: Diagnosis present

## 2022-03-10 DIAGNOSIS — D124 Benign neoplasm of descending colon: Secondary | ICD-10-CM | POA: Diagnosis not present

## 2022-03-10 DIAGNOSIS — R770 Abnormality of albumin: Secondary | ICD-10-CM | POA: Diagnosis not present

## 2022-03-10 DIAGNOSIS — K625 Hemorrhage of anus and rectum: Secondary | ICD-10-CM | POA: Diagnosis not present

## 2022-03-10 DIAGNOSIS — K219 Gastro-esophageal reflux disease without esophagitis: Secondary | ICD-10-CM

## 2022-03-10 DIAGNOSIS — T50995D Adverse effect of other drugs, medicaments and biological substances, subsequent encounter: Secondary | ICD-10-CM

## 2022-03-10 DIAGNOSIS — Z7141 Alcohol abuse counseling and surveillance of alcoholic: Secondary | ICD-10-CM

## 2022-03-10 DIAGNOSIS — K642 Third degree hemorrhoids: Secondary | ICD-10-CM | POA: Diagnosis present

## 2022-03-10 DIAGNOSIS — R319 Hematuria, unspecified: Secondary | ICD-10-CM | POA: Diagnosis not present

## 2022-03-10 DIAGNOSIS — R710 Precipitous drop in hematocrit: Secondary | ICD-10-CM | POA: Diagnosis not present

## 2022-03-10 DIAGNOSIS — I10 Essential (primary) hypertension: Secondary | ICD-10-CM | POA: Diagnosis present

## 2022-03-10 DIAGNOSIS — G9340 Encephalopathy, unspecified: Secondary | ICD-10-CM | POA: Diagnosis not present

## 2022-03-10 DIAGNOSIS — R310 Gross hematuria: Secondary | ICD-10-CM | POA: Diagnosis present

## 2022-03-10 DIAGNOSIS — F17200 Nicotine dependence, unspecified, uncomplicated: Secondary | ICD-10-CM

## 2022-03-10 DIAGNOSIS — D509 Iron deficiency anemia, unspecified: Secondary | ICD-10-CM

## 2022-03-10 DIAGNOSIS — Z886 Allergy status to analgesic agent status: Secondary | ICD-10-CM

## 2022-03-10 DIAGNOSIS — F1010 Alcohol abuse, uncomplicated: Secondary | ICD-10-CM | POA: Diagnosis present

## 2022-03-10 DIAGNOSIS — J4541 Moderate persistent asthma with (acute) exacerbation: Secondary | ICD-10-CM

## 2022-03-10 DIAGNOSIS — R569 Unspecified convulsions: Secondary | ICD-10-CM | POA: Diagnosis not present

## 2022-03-10 DIAGNOSIS — G40509 Epileptic seizures related to external causes, not intractable, without status epilepticus: Secondary | ICD-10-CM | POA: Diagnosis present

## 2022-03-10 DIAGNOSIS — G928 Other toxic encephalopathy: Principal | ICD-10-CM | POA: Diagnosis present

## 2022-03-10 DIAGNOSIS — E876 Hypokalemia: Secondary | ICD-10-CM

## 2022-03-10 DIAGNOSIS — X58XXXD Exposure to other specified factors, subsequent encounter: Secondary | ICD-10-CM | POA: Diagnosis present

## 2022-03-10 DIAGNOSIS — R4701 Aphasia: Secondary | ICD-10-CM | POA: Diagnosis present

## 2022-03-10 DIAGNOSIS — Z79899 Other long term (current) drug therapy: Secondary | ICD-10-CM

## 2022-03-10 DIAGNOSIS — K649 Unspecified hemorrhoids: Secondary | ICD-10-CM | POA: Diagnosis not present

## 2022-03-10 DIAGNOSIS — Z741 Need for assistance with personal care: Secondary | ICD-10-CM | POA: Diagnosis present

## 2022-03-10 DIAGNOSIS — K921 Melena: Secondary | ICD-10-CM

## 2022-03-10 DIAGNOSIS — F17210 Nicotine dependence, cigarettes, uncomplicated: Secondary | ICD-10-CM | POA: Diagnosis present

## 2022-03-10 DIAGNOSIS — R471 Dysarthria and anarthria: Secondary | ICD-10-CM | POA: Diagnosis present

## 2022-03-10 DIAGNOSIS — J45909 Unspecified asthma, uncomplicated: Secondary | ICD-10-CM | POA: Diagnosis present

## 2022-03-10 DIAGNOSIS — R000 Tachycardia, unspecified: Secondary | ICD-10-CM | POA: Diagnosis present

## 2022-03-10 DIAGNOSIS — Z8249 Family history of ischemic heart disease and other diseases of the circulatory system: Secondary | ICD-10-CM | POA: Diagnosis not present

## 2022-03-10 DIAGNOSIS — K644 Residual hemorrhoidal skin tags: Secondary | ICD-10-CM | POA: Diagnosis present

## 2022-03-10 DIAGNOSIS — R109 Unspecified abdominal pain: Secondary | ICD-10-CM | POA: Diagnosis not present

## 2022-03-10 DIAGNOSIS — K635 Polyp of colon: Secondary | ICD-10-CM | POA: Diagnosis not present

## 2022-03-10 DIAGNOSIS — R1110 Vomiting, unspecified: Secondary | ICD-10-CM | POA: Diagnosis not present

## 2022-03-10 HISTORY — DX: Encephalopathy, unspecified: G93.40

## 2022-03-10 LAB — GLUCOSE, CAPILLARY: Glucose-Capillary: 94 mg/dL (ref 70–99)

## 2022-03-10 LAB — CBC
HCT: 29.9 % — ABNORMAL LOW (ref 39.0–52.0)
Hemoglobin: 9 g/dL — ABNORMAL LOW (ref 13.0–17.0)
MCH: 24.9 pg — ABNORMAL LOW (ref 26.0–34.0)
MCHC: 30.1 g/dL (ref 30.0–36.0)
MCV: 82.8 fL (ref 80.0–100.0)
Platelets: 227 10*3/uL (ref 150–400)
RBC: 3.61 MIL/uL — ABNORMAL LOW (ref 4.22–5.81)
RDW: 22.6 % — ABNORMAL HIGH (ref 11.5–15.5)
WBC: 6.7 10*3/uL (ref 4.0–10.5)
nRBC: 0 % (ref 0.0–0.2)

## 2022-03-10 LAB — BASIC METABOLIC PANEL
Anion gap: 5 (ref 5–15)
BUN: 6 mg/dL (ref 6–20)
CO2: 22 mmol/L (ref 22–32)
Calcium: 8.1 mg/dL — ABNORMAL LOW (ref 8.9–10.3)
Chloride: 117 mmol/L — ABNORMAL HIGH (ref 98–111)
Creatinine, Ser: 0.56 mg/dL — ABNORMAL LOW (ref 0.61–1.24)
GFR, Estimated: >60 mL/min (ref >60)
Glucose, Bld: 95 mg/dL (ref 70–99)
Potassium: 3.6 mmol/L (ref 3.5–5.1)
Sodium: 144 mmol/L (ref 135–145)

## 2022-03-10 LAB — MAGNESIUM: Magnesium: 1.6 mg/dL — ABNORMAL LOW (ref 1.7–2.4)

## 2022-03-10 MED ORDER — CETAPHIL MOISTURIZING EX LOTN: TOPICAL_LOTION | Freq: Every day | CUTANEOUS | 0 refills | Status: DC

## 2022-03-10 MED ORDER — PANTOPRAZOLE SODIUM 40 MG IV SOLR
40.0000 mg | INTRAVENOUS | Status: DC
  Administered 2022-03-11 – 2022-03-12 (×2): 40 mg via INTRAVENOUS
  Filled 2022-03-10 (×2): qty 10

## 2022-03-10 MED ORDER — ALBUTEROL SULFATE (2.5 MG/3ML) 0.083% IN NEBU
3.0000 mL | INHALATION_SOLUTION | RESPIRATORY_TRACT | Status: DC | PRN
Start: 2022-03-10 — End: 2022-03-19

## 2022-03-10 MED ORDER — THIAMINE HCL 100 MG PO TABS: 100.0000 mg | ORAL_TABLET | Freq: Every day | ORAL | Status: DC

## 2022-03-10 MED ORDER — MOMETASONE FURO-FORMOTEROL FUM 100-5 MCG/ACT IN AERO: 2.0000 | INHALATION_SPRAY | Freq: Two times a day (BID) | RESPIRATORY_TRACT | Status: DC

## 2022-03-10 MED ORDER — OLANZAPINE 5 MG PO TABS: 5.0000 mg | ORAL_TABLET | Freq: Every day | ORAL | Status: DC

## 2022-03-10 MED ORDER — BLISTEX MEDICATED EX OINT: TOPICAL_OINTMENT | CUTANEOUS | Status: DC | PRN

## 2022-03-10 MED ORDER — FOLIC ACID 1 MG PO TABS
1.0000 mg | ORAL_TABLET | Freq: Every day | ORAL | Status: DC
  Administered 2022-03-10 – 2022-03-19 (×10): 1 mg via ORAL
  Filled 2022-03-10 (×10): qty 1

## 2022-03-10 MED ORDER — MOMETASONE FURO-FORMOTEROL FUM 100-5 MCG/ACT IN AERO
2.0000 | INHALATION_SPRAY | Freq: Two times a day (BID) | RESPIRATORY_TRACT | Status: DC
  Administered 2022-03-10 – 2022-03-19 (×17): 2 via RESPIRATORY_TRACT
  Filled 2022-03-10: qty 8.8

## 2022-03-10 MED ORDER — CETAPHIL MOISTURIZING EX LOTN
TOPICAL_LOTION | Freq: Every day | CUTANEOUS | Status: DC
  Filled 2022-03-10: qty 473

## 2022-03-10 MED ORDER — FOLIC ACID 1 MG PO TABS: 1.0000 mg | ORAL_TABLET | Freq: Every day | ORAL | Status: DC

## 2022-03-10 MED ORDER — DOCUSATE SODIUM 100 MG PO CAPS: 100.0000 mg | ORAL_CAPSULE | Freq: Two times a day (BID) | ORAL | Status: DC | PRN

## 2022-03-10 MED ORDER — POTASSIUM CHLORIDE CRYS ER 20 MEQ PO TBCR
40.0000 meq | EXTENDED_RELEASE_TABLET | Freq: Once | ORAL | Status: AC
  Administered 2022-03-10: 40 meq via ORAL
  Filled 2022-03-10: qty 2

## 2022-03-10 MED ORDER — AMLODIPINE BESYLATE 5 MG PO TABS: 5.0000 mg | ORAL_TABLET | Freq: Every day | ORAL | Status: DC

## 2022-03-10 MED ORDER — MAGNESIUM OXIDE -MG SUPPLEMENT 400 (240 MG) MG PO TABS
400.0000 mg | ORAL_TABLET | Freq: Two times a day (BID) | ORAL | Status: DC
Start: 2022-03-10 — End: 2022-03-19
  Administered 2022-03-10 – 2022-03-19 (×18): 400 mg via ORAL
  Filled 2022-03-10 (×18): qty 1

## 2022-03-10 MED ORDER — ACETAMINOPHEN 325 MG PO TABS
650.0000 mg | ORAL_TABLET | Freq: Four times a day (QID) | ORAL | Status: DC | PRN
  Administered 2022-03-10 – 2022-03-15 (×7): 650 mg via ORAL
  Filled 2022-03-10 (×7): qty 2

## 2022-03-10 MED ORDER — OLANZAPINE 5 MG PO TABS
5.0000 mg | ORAL_TABLET | Freq: Every day | ORAL | Status: DC
Start: 2022-03-10 — End: 2022-03-19
  Administered 2022-03-10 – 2022-03-18 (×9): 5 mg via ORAL
  Filled 2022-03-10 (×10): qty 1

## 2022-03-10 MED ORDER — AMLODIPINE BESYLATE 5 MG PO TABS
5.0000 mg | ORAL_TABLET | Freq: Every day | ORAL | Status: DC
  Administered 2022-03-10: 5 mg via ORAL
  Filled 2022-03-10: qty 1

## 2022-03-10 MED ORDER — ALBUTEROL SULFATE (2.5 MG/3ML) 0.083% IN NEBU: 3.0000 mL | INHALATION_SOLUTION | RESPIRATORY_TRACT | Status: DC | PRN

## 2022-03-10 MED ORDER — ACETAMINOPHEN 160 MG/5ML PO SOLN
650.0000 mg | Freq: Four times a day (QID) | ORAL | Status: DC | PRN
  Administered 2022-03-16 (×2): 650 mg via ORAL
  Filled 2022-03-10 (×2): qty 20.3

## 2022-03-10 MED ORDER — ADULT MULTIVITAMIN W/MINERALS CH
1.0000 | ORAL_TABLET | Freq: Every day | ORAL | Status: DC
  Administered 2022-03-11 – 2022-03-19 (×9): 1 via ORAL
  Filled 2022-03-10 (×8): qty 1

## 2022-03-10 MED ORDER — MAGNESIUM SULFATE 2 GM/50ML IV SOLN
2.0000 g | Freq: Once | INTRAVENOUS | Status: AC
  Administered 2022-03-10: 2 g via INTRAVENOUS
  Filled 2022-03-10: qty 50

## 2022-03-10 MED ORDER — AMLODIPINE BESYLATE 5 MG PO TABS
5.0000 mg | ORAL_TABLET | Freq: Every day | ORAL | Status: DC
  Administered 2022-03-11 – 2022-03-12 (×2): 5 mg via ORAL
  Filled 2022-03-10 (×2): qty 1

## 2022-03-10 MED ORDER — ADULT MULTIVITAMIN W/MINERALS CH: 1.0000 | ORAL_TABLET | Freq: Every day | ORAL | Status: DC

## 2022-03-10 MED ORDER — THIAMINE HCL 100 MG PO TABS
100.0000 mg | ORAL_TABLET | Freq: Every day | ORAL | Status: DC
Start: 2022-03-10 — End: 2022-03-19
  Administered 2022-03-10 – 2022-03-19 (×10): 100 mg via ORAL
  Filled 2022-03-10 (×10): qty 1

## 2022-03-10 MED ORDER — MAGNESIUM OXIDE -MG SUPPLEMENT 400 (240 MG) MG PO TABS
400.0000 mg | ORAL_TABLET | Freq: Two times a day (BID) | ORAL | Status: DC
  Administered 2022-03-10: 400 mg via ORAL
  Filled 2022-03-10: qty 1

## 2022-03-10 MED ORDER — POLYETHYLENE GLYCOL 3350 17 G PO PACK: 17.0000 g | PACK | Freq: Every day | ORAL | 0 refills | Status: DC | PRN

## 2022-03-10 MED ORDER — DIVALPROEX SODIUM 500 MG PO DR TAB: 500.0000 mg | DELAYED_RELEASE_TABLET | Freq: Two times a day (BID) | ORAL | Status: DC

## 2022-03-10 MED ORDER — DIVALPROEX SODIUM 500 MG PO DR TAB
500.0000 mg | DELAYED_RELEASE_TABLET | Freq: Two times a day (BID) | ORAL | Status: DC
  Administered 2022-03-10 – 2022-03-11 (×2): 500 mg via ORAL
  Filled 2022-03-10 (×3): qty 1

## 2022-03-10 MED ORDER — ACETAMINOPHEN 325 MG PO TABS
650.0000 mg | ORAL_TABLET | Freq: Four times a day (QID) | ORAL | Status: DC | PRN
Start: 2022-03-10 — End: 2022-03-30

## 2022-03-10 MED ORDER — MAGNESIUM OXIDE -MG SUPPLEMENT 400 (240 MG) MG PO TABS: 400.0000 mg | ORAL_TABLET | Freq: Two times a day (BID) | ORAL | Status: DC

## 2022-03-10 NOTE — Progress Notes (Signed)
PROGRESS NOTE   Timothy Hubbard  HEN:[id number] DOB: 1991-02-12 DOA: 03/01/2022 PCP: Alcus Dad, MD   Brief Narrative:  Timothy Hubbard is a 31 yo man with a hx of HTN, asthma, GERD, ETOH use disorder presented to hospital with multiple seizure-like episodes and altered mental status.  Patient also stumbled and fell in the bathroom and had teeth grinding in seizure for around 7 minutes.  Subsequently patient was lethargic and confused and was brought into the hospital after having a short episode at home.  Patient is not compliant to his medications as per the family.  Patient was initially admitted to ICU for new onset seizure-like activity in long-term monitor was initiated which was negative.  Patient was subsequently transferred out of the ICU.  He also had a fall during hospitalization and confusion.  Patient required blood transfusion on 03/05/2022.  Neurology and psychiatry was consulted and patient was started on Zyprexa.  Patient also had similar seizure-like activity when family was present at bedside, EEG was again initiated on 03/08/2022 and restraints were placed in.  Psychiatry felt that this was metabolic in nature.  Video EEG did not show any seizure-like activity.  At this time, patient is awaiting for CIR placement.  Assessment & Plan:   Principal Problem:   Encephalopathy acute Active Problems:   TOBACCO USER   Essential hypertension   Asthma in adult, moderate persistent, with acute exacerbation   Gastroesophageal reflux disease   Alcohol use disorder   Seizure (HCC)   Hypokalemia   Iron deficiency anemia  New onset seizure like activity ? Provoked secondary to polypharmacy/etoh cessation Chronic ETOH use /abuse Acute Encephalopathy, improved.  Patient had tried to cut down on the alcohol recently.  No signs of withdrawal at this time.  CTA MRI of the brain negative.  EEG unremarkable.  Neurology followed the patient during hospitalization.  Patient is currently on  Depakote from [facility] to help with behavioral issues.  Was advised seizure precautions.  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  Urine drug screen was positive for cocaine and cannabis.  Has been started on Zyprexa.  Psychiatry had seen the patient who states metabolic in nature and psychiatry has signed off.  Continue thiamine, folic acid and multivitamin.  Appears to be stable at this time.  Ambulatory dysfunction  -For CIR at this time as per PT recommendations.   Dysphagia Seen by speech therapy and currently on regular diet.  Hypokalemia Improved after replacement.  We will continue to monitor.  Add p.o. potassium 40 mEq today.  Hypomagnesemia Magnesium level 1.6.  We will replenish through IV and orally..  Likely secondary to malnutrition and alcohol abuse monitor closely.   Iron deficiency anemia Rule out acute blood loss anemia Latest hemoglobin of 9.0.  Received 2 units of packed RBC during hospitalization on 03/05/2022.Marland Kitchen  Low iron stores.  Occult blood pending.  No mention of overt blood loss.  HTN Not on medications at home.  Will start on low-dose amlodipine and monitor.   GERD -continue protonix.    Asthma, tobacco use Continue as needed nebulizers.  Incorrect/mis-documented Bipolar/Schizophrenia  Problem list corrected.  DVT prophylaxis: Lovenox  Code Status: Full  Family Communication:  Communicated with the patient's mother at bedside.    Status is: Inpt  Dispo: The patient is from: Home              Anticipated d/c is to: CIR  Anticipated d/c date is: When bed is available              Consultants:  PCCM,  Neurology,  Psychiatry  Procedures:  Video EEG  Antimicrobials:  None   Subjective: Recall patient was seen and examined at bedside.  Complains of mild back pain otherwise okay.  Patient's mother at bedside.  No shortness of breath cough fever chills or rigor.   Objective: Vitals:    03/09/22 2344 03/10/22 0607 03/10/22 0819 03/10/22 0832  BP: (!) 144/86 136/89  (!) 143/102  Pulse: 90 88  86  Resp: '17 18  18  '$ Temp: 98 F (36.7 C) 98.3 F (36.8 C)  98.4 F (36.9 C)  TempSrc:  Oral  Oral  SpO2: 100% 100% 100% 100%  Weight:      Height:        Intake/Output Summary (Last 24 hours) at 03/10/2022 1112 Last data filed at 03/10/2022 1057 Gross per 24 hour  Intake 386 ml  Output 350 ml  Net 36 ml   Filed Weights   03/04/22 0524 03/05/22 0500 03/08/22 0425  Weight: 61.3 kg 61.7 kg 57.1 kg   Body mass index is 21.61 kg/m.   Physical examination: General:  Average built, not in obvious distress HENT:   No scleral pallor or icterus noted. Oral mucosa is moist.  Chest:  Clear breath sounds.  Diminished breath sounds bilaterally. No crackles or wheezes.  CVS: S1 &S2 heard. No murmur.  Regular rate and rhythm. Abdomen: Soft, nontender, nondistended.  Bowel sounds are heard.   Extremities: No cyanosis, clubbing or edema.  Peripheral pulses are palpable. Psych: Alert, awake and oriented to person and place, normal mood, poor judgment and insight CNS:  No cranial nerve deficits.  Resolved extremities Skin: Warm and dry.  No rashes noted.  Data Reviewed: I have personally reviewed following labs and imaging studies  CBC: Recent Labs  Lab 03/06/22 0310 03/07/22 0255 03/08/22 0039 03/09/22 0124 03/10/22 0703  WBC 8.7 9.3 9.1 8.0 6.7  HGB 9.4* 9.3* 8.8* 8.5* 9.0*  HCT 29.1* 31.0* 28.9* 28.1* 29.9*  MCV 78.9* 79.7* 81.2 82.2 82.8  PLT 186 195 191 198 326   Basic Metabolic Panel: Recent Labs  Lab 03/06/22 0310 03/07/22 0255 03/08/22 0039 03/09/22 0124 03/10/22 0703  NA 141 142 142 141 144  K 3.3* 3.1* 3.1* 3.3* 3.6  CL 114* 114* 114* 116* 117*  CO2 19* 20* 20* 19* 22  GLUCOSE 89 85 93 88 95  BUN <5* <5* <5* 6 6  CREATININE 0.68 0.63 0.66 0.54* 0.56*  CALCIUM 8.4* 8.2* 7.8* 7.7* 8.1*  MG  --   --   --   --  1.6*   GFR: Estimated Creatinine Clearance:  107.1 mL/min (A) (by C-G formula based on SCr of 0.56 mg/dL (L)). Liver Function Tests: Recent Labs  Lab 03/08/22 0039 03/09/22 0124  AST 25 19  ALT 23 19  ALKPHOS 48 48  BILITOT 1.1 0.8  PROT 4.7* 4.8*  ALBUMIN 2.5* 2.4*   No results for input(s): "LIPASE", "AMYLASE" in the last 168 hours. No results for input(s): "AMMONIA" in the last 168 hours.  Coagulation Profile: No results for input(s): "INR", "PROTIME" in the last 168 hours.  Cardiac Enzymes: No results for input(s): "CKTOTAL", "CKMB", "CKMBINDEX", "TROPONINI" in the last 168 hours. BNP (last 3 results) No results for input(s): "PROBNP" in the last 8760 hours. HbA1C: No results for input(s): "HGBA1C" in the last 72 hours.  CBG:  Recent Labs  Lab 03/03/22 1209 03/03/22 1547 03/04/22 0839 03/04/22 1156 03/10/22 0733  GLUCAP 103* 105* 115* 145* 94   Lipid Profile: No results for input(s): "CHOL", "HDL", "LDLCALC", "TRIG", "CHOLHDL", "LDLDIRECT" in the last 72 hours. Thyroid Function Tests: No results for input(s): "TSH", "T4TOTAL", "FREET4", "T3FREE", "THYROIDAB" in the last 72 hours.  Anemia Panel: No results for input(s): "VITAMINB12", "FOLATE", "FERRITIN", "TIBC", "IRON", "RETICCTPCT" in the last 72 hours.  Sepsis Labs: No results for input(s): "PROCALCITON", "LATICACIDVEN" in the last 168 hours.   Recent Results (from the past 240 hour(s))  Resp Panel by RT-PCR (Flu A&B, Covid) Anterior Nasal Swab     Status: None   Collection Time: 03/01/22 12:48 AM   Specimen: Anterior Nasal Swab  Result Value Ref Range Status   SARS Coronavirus 2 by RT PCR NEGATIVE NEGATIVE Final    Comment: (NOTE) SARS-CoV-2 target nucleic acids are NOT DETECTED.  The SARS-CoV-2 RNA is generally detectable in upper respiratory specimens during the acute phase of infection. The lowest concentration of SARS-CoV-2 viral copies this assay can detect is 138 copies/mL. A negative result does not preclude SARS-Cov-2 infection and  should not be used as the sole basis for treatment or other patient management decisions. A negative result may occur with  improper specimen collection/handling, submission of specimen other than nasopharyngeal swab, presence of viral mutation(s) within the areas targeted by this assay, and inadequate number of viral copies(<138 copies/mL). A negative result must be combined with clinical observations, patient history, and epidemiological information. The expected result is Negative.  Fact Sheet for Patients:  EntrepreneurPulse.com.au  Fact Sheet for Healthcare Providers:  IncredibleEmployment.be  This test is no t yet approved or cleared by the [place] FDA and  has been authorized for detection and/or diagnosis of SARS-CoV-2 by FDA under an Emergency Use Authorization (EUA). This EUA will remain  in effect (meaning this test can be used) for the duration of the COVID-19 declaration under Section 564(b)(1) of the Act, 21 U.S.C.section 360bbb-3(b)(1), unless the authorization is terminated  or revoked sooner.       Influenza A by PCR NEGATIVE NEGATIVE Final   Influenza B by PCR NEGATIVE NEGATIVE Final    Comment: (NOTE) The Xpert Xpress SARS-CoV-2/FLU/RSV plus assay is intended as an aid in the diagnosis of influenza from Nasopharyngeal swab specimens and should not be used as a sole basis for treatment. Nasal washings and aspirates are unacceptable for Xpert Xpress SARS-CoV-2/FLU/RSV testing.  Fact Sheet for Patients: EntrepreneurPulse.com.au  Fact Sheet for Healthcare Providers: IncredibleEmployment.be  This test is not yet approved or cleared by the [place] FDA and has been authorized for detection and/or diagnosis of SARS-CoV-2 by FDA under an Emergency Use Authorization (EUA). This EUA will remain in effect (meaning this test can be used) for the duration of the COVID-19 declaration  under Section 564(b)(1) of the Act, 21 U.S.C. section 360bbb-3(b)(1), unless the authorization is terminated or revoked.  Performed at [facility], [place] [address] [place], [place] [address]   Blood Culture (routine x 2)     Status: None   Collection Time: 03/01/22  9:54 PM   Specimen: BLOOD  Result Value Ref Range Status   Specimen Description   Final    BLOOD RIGHT ANTECUBITAL Performed at [place] [address] [place], [place] [address]    Special Requests   Final    BOTTLES DRAWN AEROBIC AND ANAEROBIC Blood Culture adequate volume Performed at [facility], [place] [address] [place], [place] [address]    Culture   Final    NO GROWTH 5 DAYS Performed at [facility], [place] [address], [place], [place] [address]    Report Status 03/07/2022 FINAL  Final  Blood Culture (routine x 2)     Status: Abnormal   Collection Time: 03/01/22  9:57 PM   Specimen: BLOOD  Result Value Ref Range Status   Specimen Description   Final    BLOOD LEFT ANTECUBITAL Performed at [place] [address] [place], [place] [address]    Special Requests   Final    BOTTLES DRAWN AEROBIC AND ANAEROBIC Blood Culture adequate volume Performed at [place] [address], [place], [place] [address]    Culture  Setup Time   Final    GRAM POSITIVE COCCI IN CLUSTERS AEROBIC BOTTLE ONLY Organism ID to follow CRITICAL RESULT CALLED TO, READ BACK BY AND VERIFIED WITH:  C/ PHARMD ELIZABETH M. 03/04/22 1115 A. LAFRANCE    Culture (A)  Final    STAPHYLOCOCCUS AURICULARIS THE SIGNIFICANCE OF ISOLATING THIS ORGANISM FROM A SINGLE SET OF BLOOD CULTURES WHEN MULTIPLE SETS ARE DRAWN IS UNCERTAIN. PLEASE NOTIFY THE MICROBIOLOGY DEPARTMENT WITHIN ONE WEEK IF SPECIATION AND SENSITIVITIES ARE REQUIRED. Performed at [facility], [place] [address] [place], [place] [address]    Report Status 03/06/2022  FINAL  Final  Blood Culture ID Panel (Reflexed)     Status: Abnormal   Collection Time: 03/01/22  9:57 PM  Result Value Ref Range Status   Enterococcus faecalis NOT DETECTED NOT DETECTED Final   Enterococcus Faecium NOT DETECTED NOT DETECTED Final   Listeria monocytogenes NOT DETECTED NOT DETECTED Final   Staphylococcus species DETECTED (A) NOT DETECTED Final    Comment: CRITICAL RESULT CALLED TO, READ BACK BY AND VERIFIED WITH:  C/ PHARMD ELIZABETH M. 03/04/22 1115 A. LAFRANCE    Staphylococcus aureus (BCID) NOT DETECTED NOT DETECTED Final   Staphylococcus epidermidis NOT DETECTED NOT DETECTED Final   Staphylococcus lugdunensis NOT DETECTED NOT DETECTED Final   Streptococcus species NOT DETECTED NOT DETECTED Final   Streptococcus agalactiae NOT DETECTED NOT DETECTED Final   Streptococcus pneumoniae NOT DETECTED NOT DETECTED Final   Streptococcus pyogenes NOT DETECTED NOT DETECTED Final   A.calcoaceticus-baumannii NOT DETECTED NOT DETECTED Final   Bacteroides fragilis NOT DETECTED NOT DETECTED Final   Enterobacterales NOT DETECTED NOT DETECTED Final   Enterobacter cloacae complex NOT DETECTED NOT DETECTED Final   Escherichia coli NOT DETECTED NOT DETECTED Final   Klebsiella aerogenes NOT DETECTED NOT DETECTED Final   Klebsiella oxytoca NOT DETECTED NOT DETECTED Final   Klebsiella pneumoniae NOT DETECTED NOT DETECTED Final   Proteus species NOT DETECTED NOT DETECTED Final   Salmonella species NOT DETECTED NOT DETECTED Final   Serratia marcescens NOT DETECTED NOT DETECTED Final   Haemophilus influenzae NOT DETECTED NOT DETECTED Final   Neisseria meningitidis NOT DETECTED NOT DETECTED Final   Pseudomonas aeruginosa NOT DETECTED NOT DETECTED Final   Stenotrophomonas maltophilia NOT DETECTED NOT DETECTED Final   Candida albicans NOT DETECTED NOT DETECTED Final   Candida auris NOT DETECTED NOT DETECTED Final   Candida glabrata NOT DETECTED NOT DETECTED Final   Candida krusei NOT  DETECTED NOT DETECTED Final   Candida parapsilosis NOT DETECTED NOT DETECTED Final   Candida tropicalis NOT DETECTED NOT DETECTED Final   Cryptococcus neoformans/gattii NOT DETECTED NOT DETECTED Final    Comment: Performed  at [facility], [place] [address] [place], [place] [address]  CSF culture     Status: None   Collection Time: 03/01/22 11:16 PM   Specimen: Back; Cerebrospinal Fluid  Result Value Ref Range Status   Specimen Description   Final    BACK Performed at [place] [address] [place], [place] [address]    Special Requests   Final    NONE Performed at [facility], [place] [address] [place], [place] [address]    Gram Stain   Final    NO WBC SEEN NO ORGANISMS SEEN CYTOSPIN SMEAR Gram Stain Report Called to,Read Back By and Verified With: CALLED TO KELSEY,A ON [id number] AT [place]    Culture   Final    NO GROWTH 3 DAYS Performed at [facility], [place] [address] [place], [place] [address]    Report Status 03/05/2022 FINAL  Final  Hsv Culture And Typing     Status: None   Collection Time: 03/01/22 11:16 PM   Specimen: Back; Cerebrospinal Fluid  Result Value Ref Range Status   HSV Culture/Type Comment  Final    Comment: (NOTE) Negative No Herpes simplex virus isolated. Performed At: [facility] [place], [place] [id number] Rush Farmer MD UV:[id number]    Source of Sample CSF  Final    Comment: Performed at [facility], [place] [address], [place], [place] [address]  Urine Culture     Status: None   Collection Time: 03/02/22  1:24 AM   Specimen: In/Out Cath Urine  Result Value Ref Range Status   Specimen Description IN/OUT CATH URINE  Final   Special Requests NONE  Final   Culture   Final    NO GROWTH Performed at [facility], [place] [address], [place], [place] [address]    Report Status 03/03/2022 FINAL  Final  MRSA Next Gen by PCR, Nasal     Status:  None   Collection Time: 03/02/22  1:33 AM   Specimen: Nasal Mucosa; Nasal Swab  Result Value Ref Range Status   MRSA by PCR Next Gen NOT DETECTED NOT DETECTED Final    Comment: (NOTE) The GeneXpert MRSA Assay (FDA approved for NASAL specimens only), is one component of a comprehensive MRSA colonization surveillance program. It is not intended to diagnose MRSA infection nor to guide or monitor treatment for MRSA infections. Test performance is not FDA approved in patients less than 23 years old. Performed at [facility], [place] [address] [place], [place] [address]    Radiology Studies: Overnight EEG with video  Result Date: 03/09/2022 Lora Havens, MD     03/09/2022 10:26 AM MRN: [id number] Epilepsy Attending: Lora Havens Referring Physician/Provider: Amie Portland, MD Duration: 03/08/2022 0919 to 03/09/2022 0919  Patient history: 31 year old man with new onset of multiple seizures. EEG to evaluate for seizure.  Level of alertness: awake, asleep  AEDs during EEG study: LEV  Technical aspects: This EEG study was done with scalp electrodes positioned according to the 10-20 International system of electrode placement. Electrical activity was acquired at a sampling rate of '500Hz'$  and reviewed with a high frequency filter of '70Hz'$  and a low frequency filter of '1Hz'$ . EEG data were recorded continuously and digitally stored.  Description: The posterior dominant rhythm consists of 8-9 Hz activity of moderate voltage (25-35 uV) seen predominantly in posterior head regions, symmetric and reactive to eye opening and eye  closing. Sleep was characterized by vertex waves, sleep spindles (12 to 14 Hz), maximal frontocentral region. EEG also showed 15 to 18 Hz beta activity distributed symmetrically and diffusely. Hyperventilation and photic stimulation were not performed.   Patient event was noted on 03/08/2022 at 0944 during which was not responding.  Another similar episode was noted at 0958. This was  witnessed by psych team and described as "suddenly stopped talking to me was looking off to the L side. I kept calling his name, and tried to get his attention but he would not respond. Then suddenly he turned back to me, and I asked him where we were located and had to reorient him. Concomitant EEG before but during and after the event showed normal posterior dominant rhythm.  IMPRESSION: This study is within normal limits. No seizures or epileptiform discharges were seen throughout the recording. Two events were noted on 03/08/2022 at 0958 as described above without concomitant EEG change.  This was not an epileptic event.  Priyanka Barbra Sarks    Scheduled Meds:  amLODipine  5 mg Oral Daily   cetaphil   Topical Daily   Chlorhexidine Gluconate Cloth  6 each Topical Q0600   divalproex  500 mg Oral BID   folic acid  1 mg Intravenous Daily   magnesium oxide  400 mg Oral BID   mometasone-formoterol  2 puff Inhalation BID   multivitamin with minerals  1 tablet Oral Daily   OLANZapine  5 mg Oral QHS   mouth rinse  15 mL Mouth Rinse 4 times per day   pantoprazole (PROTONIX) IV  40 mg Intravenous Q24H   potassium chloride  40 mEq Oral Once   thiamine injection  100 mg Intravenous Daily   Continuous Infusions:  magnesium sulfate bolus IVPB       LOS: 9 days   Flora Lipps, MD [facility] 03/10/2022, 11:12 AM

## 2022-03-10 NOTE — Progress Notes (Signed)
LTM maint complete - no skin breakdown , irritation behind left ear. Serviced A1, [facility] monitored, Event button test confirmed by [facility].

## 2022-03-10 NOTE — Discharge Summary (Signed)
Physician Discharge Summary  Timothy Hubbard:[id number] DOB: 04-Sep-1991 DOA: 03/01/2022  PCP: Alcus Dad, MD  Admit date: 03/01/2022 Discharge date: 03/10/2022  Admitted From: Home  Discharge disposition: CIR  Recommendations for Outpatient Follow-Up:   Follow up with your primary care provider after completion of rehab. Follow-up with neurology in 4 to 6 weeks.  Internal referral has been made.  No driving until seen by neurology.  Discharge Diagnosis:   Principal Problem:   Encephalopathy acute Active Problems:   TOBACCO USER   Essential hypertension   Asthma in adult, moderate persistent, with acute exacerbation   Gastroesophageal reflux disease   Alcohol use disorder   Seizure (HCC)   Hypokalemia   Iron deficiency anemia   Discharge Condition: Improved.  Diet recommendation: Regular.  Wound care: None.  Code status: Full.  History of Present Illness:   [name] is a 31 yo man with a hx of HTN, asthma, GERD, ETOH use disorder presented to hospital with multiple seizure-like episodes and altered mental status.  Patient also stumbled and fell in the bathroom and had teeth grinding from seizure for around 7 minutes.  Subsequently, patient was lethargic and confused and was brought into the hospital.   Patient was initially admitted to ICU for new onset seizure-like activity and long-term monitor was initiated which was negative.  Patient was subsequently transferred out of the ICU.  He also had a fall during hospitalization and confusion.  Patient required blood transfusion on 03/05/2022.  Neurology and psychiatry were consulted and patient was started on Zyprexa.  Patient also had similar seizure-like activity when family was present at bedside, EEG was again initiated on 03/08/2022 and restraints were placed in.  Psychiatry felt that this was metabolic in nature.  Video EEG did not show any seizure-like activity.   Hospital Course:   Following conditions were  addressed during hospitalization as listed below,  New onset seizure like activity ? Provoked secondary to polypharmacy/etoh cessation Chronic ETOH use /abuse Acute Encephalopathy, improved.   Patient had tried to cut down on the alcohol recently.  No signs of withdrawal at this time.  CT MRI of the brain negative.  EEG unremarkable.  Neurology followed the patient during hospitalization.  Patient is currently on Depakote from [facility] to help with behavioral issues.  Was advised seizure precautions.  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  Urine drug screen was positive for cocaine and cannabis.  Has been started on Zyprexa.  Psychiatry had seen the patient with an impression of metabolic encephalopathy in nature and psychiatry has signed off.  Continue thiamine, folic acid and multivitamin on discharge.  Appears to be stable at this time.   Ambulatory dysfunction  -For CIR at this time as per PT recommendations.    Dysphagia Seen by speech therapy and currently on regular diet.   Hypokalemia Improved after replacement.  We will continue to monitor.  Received p.o. potassium 40 mEq today.   Hypomagnesemia  Likely secondary to malnutrition and alcohol abuse monitor closely.  Magnesium level 1.6.  Received IV Mag sulphate today. Will continue oral mag oxide on discharge.   Iron deficiency anemia. Rule out acute blood loss anemia Latest hemoglobin of 9.0.  Received 2 units of packed RBC during hospitalization on 03/05/2022.Marland Kitchen  Low iron stores.  Occult blood pending.  No mention of overt blood loss.   HTN Not on medications at home.  Has been started on low-dose amlodipine.  GERD Prescription for Protonix during hospitalization   Asthma, tobacco use Continue as needed bronchodilators   Incorrect/mis-documented Bipolar/Schizophrenia  Problem list corrected.  Disposition.  At this time, patient is stable for disposition to CIR.  Communicated  with the patient's mother at bedside.  Medical Consultants:   PCCM,  Neurology,  Psychiatry  Procedures:    Video EEG Subjective:   Today,  patient was seen and examined at bedside.  Complains of mild back pain otherwise okay.  Patient's mother at bedside.  No shortness of breath cough fever chills or rigor.   Discharge Exam:   Vitals:   03/10/22 0819 03/10/22 0832  BP:  (!) 143/102  Pulse:  86  Resp:  18  Temp:  98.4 F (36.9 C)  SpO2: 100% 100%   Vitals:   03/09/22 2344 03/10/22 0607 03/10/22 0819 03/10/22 0832  BP: (!) 144/86 136/89  (!) 143/102  Pulse: 90 88  86  Resp: '17 18  18  '$ Temp: 98 F (36.7 C) 98.3 F (36.8 C)  98.4 F (36.9 C)  TempSrc:  Oral  Oral  SpO2: 100% 100% 100% 100%  Weight:      Height:       General:  Average built, not in obvious distress HENT:   No scleral pallor or icterus noted. Oral mucosa is moist.  Chest:  Clear breath sounds.  Diminished breath sounds bilaterally. No crackles or wheezes.  CVS: S1 &S2 heard. No murmur.  Regular rate and rhythm. Abdomen: Soft, nontender, nondistended.  Bowel sounds are heard.   Extremities: No cyanosis, clubbing or edema.  Peripheral pulses are palpable. Psych: Alert, awake and oriented to person and place, normal mood, poor judgment and insight CNS:  No cranial nerve deficits.  Resolved extremities Skin: Warm and dry.  No rashes noted.  The results of significant diagnostics from this hospitalization (including imaging, microbiology, ancillary and laboratory) are listed below for reference.     Diagnostic Studies:   CT ANGIO HEAD NECK W WO CM  Result Date: 03/02/2022 CLINICAL DATA:  Vaso spasm suspected EXAM: CT ANGIOGRAPHY HEAD AND NECK TECHNIQUE: Multidetector CT imaging of the head and neck was performed using the standard protocol during bolus administration of intravenous contrast. Multiplanar CT image reconstructions and MIPs were obtained to evaluate the vascular anatomy. Carotid stenosis  measurements (when applicable) are obtained utilizing NASCET criteria, using the distal internal carotid diameter as the denominator. RADIATION DOSE REDUCTION: This exam was performed according to the departmental dose-optimization program which includes automated exposure control, adjustment of the mA and/or kV according to patient size and/or use of iterative reconstruction technique. CONTRAST:  170m OMNIPAQUE IOHEXOL 300 MG/ML  SOLN COMPARISON:  Brain MRI obtained earlier the same day, noncontrast CT head dated 1 day prior FINDINGS: CT HEAD FINDINGS Brain: There is no acute intracranial hemorrhage, extra-axial fluid collection, or acute infarct. The ventricles are stable in size. There is no mass lesion. There is no mass effect or midline shift. Vascular: See below. Skull: Normal. Negative for fracture or focal lesion. Sinuses: The paranasal sinuses are clear. Orbits: The globes and orbits are unremarkable. Review of the MIP images confirms the above findings CTA NECK FINDINGS Aortic arch: The imaged aortic arch is normal. The origins of the major branch vessels are patent. The subclavian arteries are patent to the level imaged. Right carotid system: The right common, internal, and external carotid arteries are patent, without hemodynamically significant stenosis or occlusion. There is no dissection or aneurysm. Left carotid system:  The left common, internal, and external carotid arteries are patent, without hemodynamically significant stenosis or occlusion. There is minimal plaque in the proximal left internal carotid artery. There is no dissection or aneurysm. Vertebral arteries: Vertebral arteries are patent, without hemodynamically significant stenosis or occlusion. There is no dissection or aneurysm. Skeleton: There is no acute osseous abnormality or suspicious osseous lesion. There is no visible canal hematoma. Other neck: The soft tissues of the neck are unremarkable. Upper chest: The imaged lung apices  are clear. Review of the MIP images confirms the above findings CTA HEAD FINDINGS Anterior circulation: The intracranial ICAs are patent. The bilateral MCAs are patent and normal in appearance. The bilateral ACAs are patent and normal in appearance. The anterior communicating artery is normal. There is no evidence of vasospasm.  There is no aneurysm or AVM. Posterior circulation: The bilateral V4 segments are patent. The basilar artery is patent. The bilateral PCAs are patent and normal in appearance. There is no evidence of vasospasm. Small bilateral posterior communicating arteries are identified. There is no aneurysm or AVM. Venous sinuses: As permitted by contrast timing, patent. Anatomic variants: None. Review of the MIP images confirms the above findings IMPRESSION: 1. No acute intracranial pathology. 2. Normal CTA of the head and neck. Electronically Signed   By: Valetta Mole M.D.   On: 03/02/2022 15:31   MR BRAIN W WO CONTRAST  Result Date: 03/02/2022 CLINICAL DATA:  New onset seizure EXAM: MRI HEAD WITHOUT AND WITH CONTRAST TECHNIQUE: Multiplanar, multiecho pulse sequences of the brain and surrounding structures were obtained without and with intravenous contrast. CONTRAST:  74m GADAVIST GADOBUTROL 1 MMOL/ML IV SOLN COMPARISON:  CT head dated 1 day prior FINDINGS: Brain: There is no acute intracranial hemorrhage, extra-axial fluid collection, or acute infarct. There is mild global parenchymal volume loss which appears accelerated for age. The ventricles are not enlarged. Gray-white differentiation is preserved. Parenchymal signal is normal. There is no structural or migration abnormality. The hippocampi are normal in signal and architecture There is no mass lesion. There is no abnormal enhancement. There is no mass effect or midline shift Vascular: Normal flow voids. Skull and upper cervical spine: Normal marrow signal. Sinuses/Orbits: The paranasal sinuses are clear. The globes and orbits are  unremarkable. Other: None. IMPRESSION: 1. No acute intracranial pathology or epileptogenic focus identified. 2. Mild global parenchymal volume loss, accelerated for age. Electronically Signed   By: PValetta MoleM.D.   On: 03/02/2022 14:33   Overnight EEG with video  Result Date: 03/02/2022 YLora Havens MD     03/02/2022 11:54 AM MRN: [id number] Attending: PLora HavensReferring Physician/Provider: AAmie Portland MD Duration: 03/02/2022 0321 to 140 Patient history: 31year old man with new onset of multiple seizures. EEG to evaluate for seizure.  Level of alertness: lethargic  AEDs during EEG study: LEV  Technical aspects: This EEG study was done with scalp electrodes positioned according to the 10-20 International system of electrode placement. Electrical activity was acquired at a sampling rate of '500Hz'$  and reviewed with a high frequency filter of '70Hz'$  and a low frequency filter of '1Hz'$ . EEG data were recorded continuously and digitally stored.  Description: EEG showed continuous generalized 2 to 3 Hz delta slowing admixed with an excessive amount of 15 to 18 Hz beta activity distributed symmetrically and diffusely. Hyperventilation and photic stimulation were not performed.    ABNORMALITY - Continuous slow, generalized - Excessive beta, generalized  IMPRESSION: This study is suggestive of moderate to severe  diffuse encephalopathy, nonspecific etiology but likely related to sedation. No seizures or epileptiform discharges were seen throughout the recording.  Lora Havens   EEG adult  Result Date: 03/02/2022 Lora Havens, MD     03/02/2022  8:15 AM Patient Name: Timothy Hubbard MRN: [id number] Epilepsy Attending: Lora Havens Referring Physician/Provider: Amie Portland, MD Date: 03/02/2022 Duration: 28.07 mins Patient history: 31 year old man with new onset of multiple seizures. EEG to evaluate for seizure. Level of alertness: lethargic AEDs during EEG study: LEV Technical aspects:  This EEG study was done with scalp electrodes positioned according to the 10-20 International system of electrode placement. Electrical activity was acquired at a sampling rate of '500Hz'$  and reviewed with a high frequency filter of '70Hz'$  and a low frequency filter of '1Hz'$ . EEG data were recorded continuously and digitally stored. Description: EEG showed continuous generalized 2 to 3 Hz delta slowing admixed with an excessive amount of 15 to 18 Hz beta activity distributed symmetrically and diffusely. Hyperventilation and photic stimulation were not performed.   ABNORMALITY - Continuous slow, generalized - Excessive beta, generalized IMPRESSION: This study is suggestive of moderate to severe diffuse encephalopathy, nonspecific etiology but likely related to sedation. No seizures or epileptiform discharges were seen throughout the recording. Lora Havens   CT Head Wo Contrast  Result Date: 03/01/2022 CLINICAL DATA:  Mental status change, unknown EXAM: CT HEAD WITHOUT CONTRAST TECHNIQUE: Contiguous axial images were obtained from the base of the skull through the vertex without intravenous contrast. RADIATION DOSE REDUCTION: This exam was performed according to the departmental dose-optimization program which includes automated exposure control, adjustment of the mA and/or kV according to patient size and/or use of iterative reconstruction technique. COMPARISON:  CT head dated Feb 01, 2013 FINDINGS: Brain: No evidence of acute infarction, hemorrhage, hydrocephalus, extra-axial collection or mass lesion/mass effect. Vascular: No hyperdense vessel or unexpected calcification. Skull: Normal. Negative for fracture or focal lesion. Sinuses/Orbits: No acute finding. Other: None. IMPRESSION: No acute intracranial abnormality. Electronically Signed   By: Keane Police D.O.   On: 03/01/2022 22:31   DG Chest Port 1 View  Result Date: 03/01/2022 CLINICAL DATA:  Questionable sepsis - evaluate for abnormality. Unresponsive  EXAM: PORTABLE CHEST 1 VIEW COMPARISON:  None Available. FINDINGS: The heart size and mediastinal contours are within normal limits. Both lungs are clear. The visualized skeletal structures are unremarkable. IMPRESSION: No active disease. Electronically Signed   By: Rolm Baptise M.D.   On: 03/01/2022 22:10     Labs:   Basic Metabolic Panel: Recent Labs  Lab 03/06/22 0310 03/07/22 0255 03/08/22 0039 03/09/22 0124 03/10/22 0703  NA 141 142 142 141 144  K 3.3* 3.1* 3.1* 3.3* 3.6  CL 114* 114* 114* 116* 117*  CO2 19* 20* 20* 19* 22  GLUCOSE 89 85 93 88 95  BUN <5* <5* <5* 6 6  CREATININE 0.68 0.63 0.66 0.54* 0.56*  CALCIUM 8.4* 8.2* 7.8* 7.7* 8.1*  MG  --   --   --   --  1.6*   GFR Estimated Creatinine Clearance: 107.1 mL/min (A) (by C-G formula based on SCr of 0.56 mg/dL (L)). Liver Function Tests: Recent Labs  Lab 03/08/22 0039 03/09/22 0124  AST 25 19  ALT 23 19  ALKPHOS 48 48  BILITOT 1.1 0.8  PROT 4.7* 4.8*  ALBUMIN 2.5* 2.4*   No results for input(s): "LIPASE", "AMYLASE" in the last 168 hours. No results for input(s): "AMMONIA" in the last 168 hours. Coagulation  profile No results for input(s): "INR", "PROTIME" in the last 168 hours.  CBC: Recent Labs  Lab 03/06/22 0310 03/07/22 0255 03/08/22 0039 03/09/22 0124 03/10/22 0703  WBC 8.7 9.3 9.1 8.0 6.7  HGB 9.4* 9.3* 8.8* 8.5* 9.0*  HCT 29.1* 31.0* 28.9* 28.1* 29.9*  MCV 78.9* 79.7* 81.2 82.2 82.8  PLT 186 195 191 198 227   Cardiac Enzymes: No results for input(s): "CKTOTAL", "CKMB", "CKMBINDEX", "TROPONINI" in the last 168 hours. BNP: Invalid input(s): "POCBNP" CBG: Recent Labs  Lab 03/03/22 1547 03/04/22 0839 03/04/22 1156 03/10/22 0733  GLUCAP 105* 115* 145* 94   D-Dimer No results for input(s): "DDIMER" in the last 72 hours. Hgb A1c No results for input(s): "HGBA1C" in the last 72 hours. Lipid Profile No results for input(s): "CHOL", "HDL", "LDLCALC", "TRIG", "CHOLHDL", "LDLDIRECT" in the  last 72 hours. Thyroid function studies No results for input(s): "TSH", "T4TOTAL", "T3FREE", "THYROIDAB" in the last 72 hours.  Invalid input(s): "FREET3" Anemia work up No results for input(s): "VITAMINB12", "FOLATE", "FERRITIN", "TIBC", "IRON", "RETICCTPCT" in the last 72 hours. Microbiology Recent Results (from the past 240 hour(s))  Resp Panel by RT-PCR (Flu A&B, Covid) Anterior Nasal Swab     Status: None   Collection Time: 03/01/22 12:48 AM   Specimen: Anterior Nasal Swab  Result Value Ref Range Status   SARS Coronavirus 2 by RT PCR NEGATIVE NEGATIVE Final    Comment: (NOTE) SARS-CoV-2 target nucleic acids are NOT DETECTED.  The SARS-CoV-2 RNA is generally detectable in upper respiratory specimens during the acute phase of infection. The lowest concentration of SARS-CoV-2 viral copies this assay can detect is 138 copies/mL. A negative result does not preclude SARS-Cov-2 infection and should not be used as the sole basis for treatment or other patient management decisions. A negative result may occur with  improper specimen collection/handling, submission of specimen other than nasopharyngeal swab, presence of viral mutation(s) within the areas targeted by this assay, and inadequate number of viral copies(<138 copies/mL). A negative result must be combined with clinical observations, patient history, and epidemiological information. The expected result is Negative.  Fact Sheet for Patients:  EntrepreneurPulse.com.au  Fact Sheet for Healthcare Providers:  IncredibleEmployment.be  This test is no t yet approved or cleared by the [place] FDA and  has been authorized for detection and/or diagnosis of SARS-CoV-2 by FDA under an Emergency Use Authorization (EUA). This EUA will remain  in effect (meaning this test can be used) for the duration of the COVID-19 declaration under Section 564(b)(1) of the Act, 21 U.S.C.section  360bbb-3(b)(1), unless the authorization is terminated  or revoked sooner.       Influenza A by PCR NEGATIVE NEGATIVE Final   Influenza B by PCR NEGATIVE NEGATIVE Final    Comment: (NOTE) The Xpert Xpress SARS-CoV-2/FLU/RSV plus assay is intended as an aid in the diagnosis of influenza from Nasopharyngeal swab specimens and should not be used as a sole basis for treatment. Nasal washings and aspirates are unacceptable for Xpert Xpress SARS-CoV-2/FLU/RSV testing.  Fact Sheet for Patients: EntrepreneurPulse.com.au  Fact Sheet for Healthcare Providers: IncredibleEmployment.be  This test is not yet approved or cleared by the [place] FDA and has been authorized for detection and/or diagnosis of SARS-CoV-2 by FDA under an Emergency Use Authorization (EUA). This EUA will remain in effect (meaning this test can be used) for the duration of the COVID-19 declaration under Section 564(b)(1) of the Act, 21 U.S.C. section 360bbb-3(b)(1), unless the authorization is terminated or revoked.  Performed  at [facility], [place] [address], [place], [place] [address]   Blood Culture (routine x 2)     Status: None   Collection Time: 03/01/22  9:54 PM   Specimen: BLOOD  Result Value Ref Range Status   Specimen Description   Final    BLOOD RIGHT ANTECUBITAL Performed at [facility] [address] [place], [place] [address]    Special Requests   Final    BOTTLES DRAWN AEROBIC AND ANAEROBIC Blood Culture adequate volume Performed at [facility] [address] [place], [place] [address]    Culture   Final    NO GROWTH 5 DAYS Performed at [facility], [place] [address] [place], [place] [address]    Report Status 03/07/2022 FINAL  Final  Blood Culture (routine x 2)     Status: Abnormal   Collection Time: 03/01/22  9:57 PM   Specimen: BLOOD  Result Value Ref Range Status   Specimen  Description   Final    BLOOD LEFT ANTECUBITAL Performed at [facility] [address] [place], [place] [address]    Special Requests   Final    BOTTLES DRAWN AEROBIC AND ANAEROBIC Blood Culture adequate volume Performed at [facility] [address], [place], [place] [address]    Culture  Setup Time   Final    GRAM POSITIVE COCCI IN CLUSTERS AEROBIC BOTTLE ONLY Organism ID to follow CRITICAL RESULT CALLED TO, READ BACK BY AND VERIFIED WITH:  C/ PHARMD ELIZABETH M. 03/04/22 1115 A. LAFRANCE    Culture (A)  Final    STAPHYLOCOCCUS AURICULARIS THE SIGNIFICANCE OF ISOLATING THIS ORGANISM FROM A SINGLE SET OF BLOOD CULTURES WHEN MULTIPLE SETS ARE DRAWN IS UNCERTAIN. PLEASE NOTIFY THE MICROBIOLOGY DEPARTMENT WITHIN ONE WEEK IF SPECIATION AND SENSITIVITIES ARE REQUIRED. Performed at [facility], [place] [address] [place],  [address]    Report Status 03/06/2022 FINAL  Final  Blood Culture ID Panel (Reflexed)     Status: Abnormal   Collection Time: 03/01/22  9:57 PM  Result Value Ref Range Status   Enterococcus faecalis NOT DETECTED NOT DETECTED Final   Enterococcus Faecium NOT DETECTED NOT DETECTED Final   Listeria monocytogenes NOT DETECTED NOT DETECTED Final   Staphylococcus species DETECTED (A) NOT DETECTED Final    Comment: CRITICAL RESULT CALLED TO, READ BACK BY AND VERIFIED WITH:  C/ PHARMD ELIZABETH M. 03/04/22 1115 A. LAFRANCE    Staphylococcus aureus (BCID) NOT DETECTED NOT DETECTED Final   Staphylococcus epidermidis NOT DETECTED NOT DETECTED Final   Staphylococcus lugdunensis NOT DETECTED NOT DETECTED Final   Streptococcus species NOT DETECTED NOT DETECTED Final   Streptococcus agalactiae NOT DETECTED NOT DETECTED Final   Streptococcus pneumoniae NOT DETECTED NOT DETECTED Final   Streptococcus pyogenes NOT DETECTED NOT DETECTED Final   A.calcoaceticus-baumannii NOT DETECTED NOT DETECTED Final   Bacteroides fragilis NOT  DETECTED NOT DETECTED Final   Enterobacterales NOT DETECTED NOT DETECTED Final   Enterobacter cloacae complex NOT DETECTED NOT DETECTED Final   Escherichia coli NOT DETECTED NOT DETECTED Final   Klebsiella aerogenes NOT DETECTED NOT DETECTED Final   Klebsiella oxytoca NOT DETECTED NOT DETECTED Final   Klebsiella pneumoniae NOT DETECTED NOT DETECTED Final   Proteus species NOT DETECTED NOT DETECTED Final   Salmonella species NOT DETECTED NOT DETECTED Final   Serratia marcescens NOT DETECTED NOT DETECTED Final   Haemophilus influenzae NOT DETECTED NOT DETECTED Final  Neisseria meningitidis NOT DETECTED NOT DETECTED Final   Pseudomonas aeruginosa NOT DETECTED NOT DETECTED Final   Stenotrophomonas maltophilia NOT DETECTED NOT DETECTED Final   Candida albicans NOT DETECTED NOT DETECTED Final   Candida auris NOT DETECTED NOT DETECTED Final   Candida glabrata NOT DETECTED NOT DETECTED Final   Candida krusei NOT DETECTED NOT DETECTED Final   Candida parapsilosis NOT DETECTED NOT DETECTED Final   Candida tropicalis NOT DETECTED NOT DETECTED Final   Cryptococcus neoformans/gattii NOT DETECTED NOT DETECTED Final    Comment: Performed at [facility], [place] [address], [place], [place] [address]  CSF culture     Status: None   Collection Time: 03/01/22 11:16 PM   Specimen: Back; Cerebrospinal Fluid  Result Value Ref Range Status   Specimen Description   Final    BACK Performed at [facility] [address], [place], [place] [address]    Special Requests   Final    NONE Performed at [facility], [place] [address], [place], [place] [address]    Gram Stain   Final    NO WBC SEEN NO ORGANISMS SEEN CYTOSPIN SMEAR Gram Stain Report Called to,Read Back By and Verified With: CALLED TO KELSEY,A ON [id number] AT [facility]    Culture   Final    NO GROWTH 3 DAYS Performed at [facility], [place] [address] [place], [place] [address]    Report  Status 03/05/2022 FINAL  Final  Hsv Culture And Typing     Status: None   Collection Time: 03/01/22 11:16 PM   Specimen: Back; Cerebrospinal Fluid  Result Value Ref Range Status   HSV Culture/Type Comment  Final    Comment: (NOTE) Negative No Herpes simplex virus isolated. Performed At: [facility] [place], [place] [id number] Rush Farmer MD UR:[id number]    Source of Sample CSF  Final    Comment: Performed at [facility], [place] [address], [place], [place] [address]  Urine Culture     Status: None   Collection Time: 03/02/22  1:24 AM   Specimen: In/Out Cath Urine  Result Value Ref Range Status   Specimen Description IN/OUT CATH URINE  Final   Special Requests NONE  Final   Culture   Final    NO GROWTH Performed at [facility], [place] [address], [place], [place] [address]    Report Status 03/03/2022 FINAL  Final  MRSA Next Gen by PCR, Nasal     Status: None   Collection Time: 03/02/22  1:33 AM   Specimen: Nasal Mucosa; Nasal Swab  Result Value Ref Range Status   MRSA by PCR Next Gen NOT DETECTED NOT DETECTED Final    Comment: (NOTE) The GeneXpert MRSA Assay (FDA approved for NASAL specimens only), is one component of a comprehensive MRSA colonization surveillance program. It is not intended to diagnose MRSA infection nor to guide or monitor treatment for MRSA infections. Test performance is not FDA approved in patients less than 42 years old. Performed at [facility], [place] [address], [place], [place] [address]      Discharge Instructions:   Discharge Instructions     Ambulatory referral to Neurology   Complete by: As directed    An appointment is requested in approximately: 4 weeks   Diet general   Complete by: As directed    Discharge instructions   Complete by: As directed    Follow-up with  your primary care provider after discharge from rehabilitation facility.  Continue rehabilitation.  No alcohol usage.   No driving after discharge.  Follow-up with neurology in 4 to 6 weeks.   Increase activity slowly   Complete by: As directed       Allergies as of 03/10/2022       Reactions   Ibuprofen Anaphylaxis        Medication List     TAKE these medications    acetaminophen 325 MG tablet Commonly known as: TYLENOL Take 2 tablets (650 mg total) by mouth every 6 (six) hours as needed for mild pain or moderate pain.   albuterol (2.5 MG/3ML) 0.083% nebulizer solution Commonly known as: PROVENTIL Inhale 3 mLs into the lungs every 4 (four) hours as needed for wheezing or shortness of breath.   amLODipine 5 MG tablet Commonly known as: NORVASC Take 1 tablet (5 mg total) by mouth daily.   cetaphil lotion Apply topically daily. Start taking on: March 11, 2022   divalproex 500 MG DR tablet Commonly known as: DEPAKOTE Take 1 tablet (500 mg total) by mouth 2 (two) times daily.   docusate sodium 100 MG capsule Commonly known as: COLACE Take 1 capsule (100 mg total) by mouth 2 (two) times daily as needed for mild constipation.   folic acid 1 MG tablet Commonly known as: FOLVITE Take 1 tablet (1 mg total) by mouth daily.   magnesium oxide 400 (240 Mg) MG tablet Commonly known as: MAG-OX Take 1 tablet (400 mg total) by mouth 2 (two) times daily.   mometasone-formoterol 100-5 MCG/ACT Aero Commonly known as: DULERA Inhale 2 puffs into the lungs 2 (two) times daily.   multivitamin with minerals Tabs tablet Take 1 tablet by mouth daily. Start taking on: March 11, 2022   OLANZapine 5 MG tablet Commonly known as: ZYPREXA Take 1 tablet (5 mg total) by mouth at bedtime.   polyethylene glycol 17 g packet Commonly known as: MIRALAX / GLYCOLAX Take 17 g by mouth daily as needed for moderate constipation.   thiamine 100 MG tablet Take 1 tablet (100 mg total) by mouth daily.          Time coordinating discharge: 39 minutes  Signed:  Katrese Shell  [facility] 03/10/2022,  12:34 PM

## 2022-03-10 NOTE — TOC Transition Note (Signed)
Transition of Care [facility]) - CM/SW Discharge Note   Patient Details  Name: Timothy Hubbard MRN: [id number] Date of Birth: 1990/11/13  [facility] CM/SW Contact:  Pollie Friar, RN Phone Number: 03/10/2022, 12:00 PM   Clinical Narrative:    Pt is discharging to CIR today. CM signing off.   Final next level of care: IP Rehab Facility Barriers to Discharge: No Barriers Identified   Patient Goals and CMS Choice Patient states their goals for this hospitalization and ongoing recovery are:: To return home CMS Medicare.gov Compare Post Acute Care list provided to:: Patient Choice offered to / list presented to : Patient  Discharge Placement                       Discharge Plan and Services   Discharge Planning Services: CM Consult                                 Social Determinants of Health (SDOH) Interventions     Readmission Risk Interventions     No data to display

## 2022-03-10 NOTE — H&P (Signed)
Physical Medicine and Rehabilitation Admission H&P        Chief Complaint  Patient presents with   Seizures  : HPI: Timothy Hubbard is a 32 year old right-handed male with history of hypertension, asthma, alcohol/tobacco use on no prescription medications.  Per chart review patient lives with parent.  1 level home.  Independent/employed prior to admission.  Presented 03/01/2022 with reported multiple seizures and altered mental status.  I report patient had normal health then coming out of the bathroom stumbling and fell.  No head trauma.  CT/MRI showed no acute intracranial pathology.  Mild global parenchymal volume loss accelerated for age.  CT angiogram head and neck negative.  Admission chemistries lactic acid 7.5-9.0, creatinine 1.29, troponin negative, blood cultures no growth to date, WBC 12,800, urine drug screen positive cocaine benzos as well as marijuana.  Initial EEG moderate to severe diffuse encephalopathy no seizure and repeated with 24-hour EEG again negative for seizure.  Lumbar puncture completed with glucose 76, total protein past 6.  CSF culture no organisms seen.  VDRL nonreactive.  Initially he was loaded with Keppra as well as Depakote and has since been weaned to Depakote alone.  Seizure felt to be provoked secondary to polypharmacy/alcohol cessation.  Psychiatry consulted 03/08/2022 for ongoing confusion and has been placed on Zyprexa.  Hospital course bouts of hypokalemia as well as hypomagnesemia with supplement added.  Iron deficiency anemia 6.3 and he did receive 2 units packed red blood cells 03/05/2022.  Occult blood pending and latest hemoglobin 9.0.  He is tolerating a regular diet.  Therapy evaluations completed due to patient decreased functional mobility encephalopathy was admitted for a comprehensive rehab program.   Pt reports has condom catheter-  LBM this AM; little sore R side, but otherwise, no pain.  Peeing OK, but pt said is tea colored- nothing in bag, so  not clear.        Review of Systems  Constitutional:  Positive for malaise/fatigue. Negative for chills and fever.  HENT:  Negative for hearing loss.   Eyes:  Negative for blurred vision and double vision.  Respiratory:  Negative for cough and shortness of breath.   Cardiovascular:  Negative for chest pain, palpitations and leg swelling.  Gastrointestinal:  Positive for constipation. Negative for heartburn, nausea and vomiting.  Genitourinary:  Negative for dysuria, flank pain and hematuria.  Musculoskeletal:  Positive for joint pain and myalgias.  Skin:  Negative for rash.  Neurological:  Positive for seizures and weakness.  All other systems reviewed and are negative.       Past Medical History:  Diagnosis Date   Asthma     HTN (hypertension)      No past surgical history on file.      Family History  Problem Relation Age of Onset   Hypertension Mother     Cancer Maternal Grandfather          doesn't know the type    Social History:  reports that he has been smoking cigarettes. He started smoking about 14 years ago. He has a 4.00 pack-year smoking history. He has never used smokeless tobacco. He reports current alcohol use of about 2.0 standard drinks of alcohol per week. He reports current drug use. Frequency: 7.00 times per week. Drug: Marijuana. Allergies:      Allergies  Allergen Reactions   Ibuprofen Anaphylaxis    No medications prior to admission.          Home: Home  Living Family/patient expects to be discharged to:: Private residence Living Arrangements: Parent Available Help at Discharge: Family, Available 24 hours/day Type of Home: House Home Access: Stairs to enter [employer] of Steps: 5 Entrance Stairs-Rails: Can reach both Home Layout: One level Bathroom Shower/Tub: Optometrist: Yes Home Equipment: None  Lives With: Family   Functional History: Prior Function Prior Level of  Function : Independent/Modified Independent, Working/employed, Art gallery manager Comments: fully independent prior to admission ADLs Comments: independent prior to admission   Functional Status:  Mobility: Bed Mobility Overal bed mobility: Needs Assistance Bed Mobility: Supine to Sit Supine to sit: Min guard Sit to supine: Min guard, +2 for safety/equipment General bed mobility comments: for safety; able to achieve an upright sitting position at EOB towards his R side with use of bed rails Transfers Overall transfer level: Needs assistance Equipment used: 2 person hand held assist Transfers: Sit to/from Stand, Bed to chair/wheelchair/BSC Sit to Stand: Min assist, +2 safety/equipment Bed to/from chair/wheelchair/BSC transfer type:: Stand pivot Stand pivot transfers: Min assist, +2 safety/equipment General transfer comment: assistance for stability, safety and line management; pt able to complete one sit<>stand from EOB, pivot towards his left side to the [facility] and then one sit<>stand from [facility] Ambulation/Gait Ambulation/Gait assistance: Min assist, +2 safety/equipment Gait Distance (Feet): 5 Feet Assistive device: 2 person hand held assist Gait Pattern/deviations: Step-through pattern, Decreased stride length General Gait Details: pt able to ambulate a short distance from [facility] to recliner chair (limited secondary to contiuous EEG lines/monitoring); very unsteady but able to safely manage with min A x2 Gait velocity: decreased   ADL: ADL Overall ADL's : Needs assistance/impaired Eating/Feeding: Minimal assistance, Bed level Grooming: Minimal assistance, Standing, Oral care Upper Body Bathing: Minimal assistance, Bed level Lower Body Bathing: Maximal assistance, +2 for safety/equipment, +2 for physical assistance, Sit to/from stand, Sitting/lateral leans Upper Body Dressing : Minimal assistance, Bed level Upper Body Dressing Details (indicate cue type and reason): to don gown Lower Body  Dressing: Moderate assistance, Bed level Lower Body Dressing Details (indicate cue type and reason): to don socks Toilet Transfer: Stand-pivot, Ambulation, Minimal assistance, +2 for safety/equipment Toilet Transfer Details (indicate cue type and reason): to [facility] Toileting- Clothing Manipulation and Hygiene: Moderate assistance, Sit to/from stand Toileting - Clothing Manipulation Details (indicate cue type and reason): for pericare General ADL Comments: less impulsivity noted this session   Cognition: Cognition Overall Cognitive Status: Impaired/Different from baseline Orientation Level: Oriented X4 Cognition Arousal/Alertness: Awake/alert Behavior During Therapy: Impulsive Overall Cognitive Status: Impaired/Different from baseline Area of Impairment: Following commands, Safety/judgement, Awareness, Problem solving Following Commands: Follows one step commands with increased time Safety/Judgement: Decreased awareness of deficits, Decreased awareness of safety Awareness: Emergent ("states need to go use bathroom") Problem Solving: Slow processing, Decreased initiation, Difficulty sequencing, Requires verbal cues, Requires tactile cues General Comments: Decreased safety awareness and deficit awareness; states he is in [place], but tangential and needs assist to redirect Difficult to assess due to: Impaired communication (unintelligible speech ~25% of session)   Physical Exam: Blood pressure (!) 143/102, pulse 86, temperature 98.4 F (36.9 C), temperature source Oral, resp. rate 18, height '5\' 4"'$  (1.626 m), weight 57.1 kg, SpO2 100 %. Physical Exam Vitals and nursing note reviewed.  Constitutional:      Appearance: Normal appearance. He is normal weight.     Comments: Pt sitting up in bedside chair- appears older than stated age, NAD   HENT:     Head: Normocephalic and atraumatic.  Comments: No facial droop No tongue deviation    Right Ear: External ear normal.     Left Ear:  External ear normal.     Nose: Nose normal. No congestion.     Mouth/Throat:     Mouth: Mucous membranes are dry.     Pharynx: Oropharynx is clear. No oropharyngeal exudate.  Eyes:     General:        Right eye: No discharge.        Left eye: No discharge.     Extraocular Movements: Extraocular movements intact.     Comments: No nystagmus  Cardiovascular:     Rate and Rhythm: Regular rhythm. Tachycardia present.     Heart sounds: Normal heart sounds. No murmur heard.    No gallop.  Pulmonary:     Effort: Pulmonary effort is normal. No respiratory distress.     Breath sounds: Normal breath sounds. No wheezing, rhonchi or rales.  Abdominal:     General: Abdomen is flat. There is no distension.     Palpations: Abdomen is soft.     Tenderness: There is no abdominal tenderness.     Comments: Slightly hypoactive  Genitourinary:    Comments: Condom catheter in place- no urine in bag- just emptied Musculoskeletal:     Cervical back: Neck supple. No tenderness.     Comments: 5-/5 B/L in biceps, triceps, WE, grip and FA B/L  5-/5 in LE's except L HF was 4/5   Skin:    General: Skin is warm and dry.  Neurological:     Comments: Patient sitting up in chair.  Makes eye contact with examiner.  He is a bit anxious but does participate with exam.  Provided his name.  Follows simple commands. Very dysarthric- hard to understand at times Intact to light touch in all 4 extremities  Psychiatric:     Comments: Flat affect        Lab Results Last 48 Hours        Results for orders placed or performed during the hospital encounter of 03/01/22 (from the past 48 hour(s))  Comprehensive metabolic panel     Status: Abnormal    Collection Time: 03/09/22  1:24 AM  Result Value Ref Range    Sodium 141 135 - 145 mmol/L    Potassium 3.3 (L) 3.5 - 5.1 mmol/L    Chloride 116 (H) 98 - 111 mmol/L    CO2 19 (L) 22 - 32 mmol/L    Glucose, Bld 88 70 - 99 mg/dL      Comment: Glucose reference range  applies only to samples taken after fasting for at least 8 hours.    BUN 6 6 - 20 mg/dL    Creatinine, Ser 0.54 (L) 0.61 - 1.24 mg/dL    Calcium 7.7 (L) 8.9 - 10.3 mg/dL    Total Protein 4.8 (L) 6.5 - 8.1 g/dL    Albumin 2.4 (L) 3.5 - 5.0 g/dL    AST 19 15 - 41 U/L    ALT 19 0 - 44 U/L    Alkaline Phosphatase 48 38 - 126 U/L    Total Bilirubin 0.8 0.3 - 1.2 mg/dL    GFR, Estimated >60 >60 mL/min      Comment: (NOTE) Calculated using the CKD-EPI Creatinine Equation (2021)      Anion gap 6 5 - 15      Comment: Performed at [facility], [place] [address], [place], [place] [address]  CBC  Status: Abnormal    Collection Time: 03/09/22  1:24 AM  Result Value Ref Range    WBC 8.0 4.0 - 10.5 K/uL    RBC 3.42 (L) 4.22 - 5.81 MIL/uL    Hemoglobin 8.5 (L) 13.0 - 17.0 g/dL    HCT 28.1 (L) 39.0 - 52.0 %    MCV 82.2 80.0 - 100.0 fL    MCH 24.9 (L) 26.0 - 34.0 pg    MCHC 30.2 30.0 - 36.0 g/dL    RDW 22.5 (H) 11.5 - 15.5 %    Platelets 198 150 - 400 K/uL    nRBC 0.0 0.0 - 0.2 %      Comment: Performed at [facility] [address], [place], [place] [address]  CBC     Status: Abnormal    Collection Time: 03/10/22  7:03 AM  Result Value Ref Range    WBC 6.7 4.0 - 10.5 K/uL    RBC 3.61 (L) 4.22 - 5.81 MIL/uL    Hemoglobin 9.0 (L) 13.0 - 17.0 g/dL    HCT 29.9 (L) 39.0 - 52.0 %    MCV 82.8 80.0 - 100.0 fL    MCH 24.9 (L) 26.0 - 34.0 pg    MCHC 30.1 30.0 - 36.0 g/dL    RDW 22.6 (H) 11.5 - 15.5 %    Platelets 227 150 - 400 K/uL    nRBC 0.0 0.0 - 0.2 %      Comment: Performed at [facility], [place] [address], [place], [place] [address]  Basic metabolic panel     Status: Abnormal    Collection Time: 03/10/22  7:03 AM  Result Value Ref Range    Sodium 144 135 - 145 mmol/L    Potassium 3.6 3.5 - 5.1 mmol/L    Chloride 117 (H) 98 - 111 mmol/L    CO2 22 22 - 32 mmol/L    Glucose, Bld 95 70 - 99 mg/dL      Comment: Glucose reference range applies only to samples taken after  fasting for at least 8 hours.    BUN 6 6 - 20 mg/dL    Creatinine, Ser 0.56 (L) 0.61 - 1.24 mg/dL    Calcium 8.1 (L) 8.9 - 10.3 mg/dL    GFR, Estimated >60 >60 mL/min      Comment: (NOTE) Calculated using the CKD-EPI Creatinine Equation (2021)      Anion gap 5 5 - 15      Comment: Performed at [facility] [address], [place], [place] [address]  Magnesium     Status: Abnormal    Collection Time: 03/10/22  7:03 AM  Result Value Ref Range    Magnesium 1.6 (L) 1.7 - 2.4 mg/dL      Comment: Performed at [facility] [address] [place], [place] [address]  Glucose, capillary     Status: None    Collection Time: 03/10/22  7:33 AM  Result Value Ref Range    Glucose-Capillary 94 70 - 99 mg/dL      Comment: Glucose reference range applies only to samples taken after fasting for at least 8 hours.       Imaging Results (Last 48 hours)  Overnight EEG with video   Result Date: 03/09/2022 Lora Havens, MD     03/09/2022 10:26 AM MRN: [id number] Epilepsy Attending: Lora Havens Referring Physician/Provider: Amie [place], MD Duration: 03/08/2022 0919 to 03/09/2022 0919  Patient history: 31 year old man  with new onset of multiple seizures. EEG to evaluate for seizure.  Level of alertness: awake, asleep  AEDs during EEG study: LEV  Technical aspects: This EEG study was done with scalp electrodes positioned according to the 10-20 International system of electrode placement. Electrical activity was acquired at a sampling rate of '500Hz'$  and reviewed with a high frequency filter of '70Hz'$  and a low frequency filter of '1Hz'$ . EEG data were recorded continuously and digitally stored.  Description: The posterior dominant rhythm consists of 8-9 Hz activity of moderate voltage (25-35 uV) seen predominantly in posterior head regions, symmetric and reactive to eye opening and eye closing. Sleep was characterized by vertex waves, sleep spindles (12 to 14 Hz), maximal frontocentral region. EEG also  showed 15 to 18 Hz beta activity distributed symmetrically and diffusely. Hyperventilation and photic stimulation were not performed.   Patient event was noted on 03/08/2022 at 0944 during which was not responding.  Another similar episode was noted at 0958. This was witnessed by psych team and described as "suddenly stopped talking to me was looking off to the L side. I kept calling his name, and tried to get his attention but he would not respond. Then suddenly he turned back to me, and I asked him where we were located and had to reorient him. Concomitant EEG before but during and after the event showed normal posterior dominant rhythm.  IMPRESSION: This study is within normal limits. No seizures or epileptiform discharges were seen throughout the recording. Two events were noted on 03/08/2022 at 0958 as described above without concomitant EEG change.  This was not an epileptic event.  Priyanka O Yadav          Blood pressure (!) 143/102, pulse 86, temperature 98.4 F (36.9 C), temperature source Oral, resp. rate 18, height '5\' 4"'$  (1.626 m), weight 57.1 kg, SpO2 100 %.   Medical Problem List and Plan: 1. Functional deficits secondary to acute encephalopathy provoked secondary to polypharmacy/alcohol cessation.             -patient may  shower             -ELOS/Goals: 10-14 days- Supervision to min A 2.  Antithrombotics: -DVT/anticoagulation:  Mechanical: Antiembolism stockings, thigh (TED hose) Bilateral lower extremities             -antiplatelet therapy: N/A 3. Pain Management: Tylenol as needed 4. Mood/Sleep: Provide emotional support             -antipsychotic agents: Zyprexa 5 mg nightly 5. Neuropsych/cognition: This patient is not capable of making decisions on his own behalf. 6. Skin/Wound Care: Routine skin checks 7. Fluids/Electrolytes/Nutrition: Routine INO's with follow-up chemistries 8.  Seizure prophylaxis.  Depakote 500 mg twice daily.  EEG negative 9.  Hypertension.  Norvasc 5 mg  daily 10.  Asthma.  Continue Dulera as well as nebulizer as needed.  Check oxygen saturations every shift 11.  History of alcohol tobacco abuse.  Counseling. 12.  Iron deficiency anemia.  Patient did receive 2 units packed red blood cell 03/05/2022.  Follow-up CBC 13.  GERD.  Protonix   I have personally performed a face to face diagnostic evaluation of this patient and formulated the key components of the plan.  Additionally, I have personally reviewed laboratory data, imaging studies, as well as relevant notes and concur with the physician assistant's documentation above.   The patient's status has not changed from the original H&P.  Any changes in documentation from the acute care chart  have been noted above.         Lavon Paganini Angiulli, PA-C 03/10/2022

## 2022-03-10 NOTE — Progress Notes (Signed)
Physical Therapy Treatment Patient Details Name: Timothy Hubbard MRN: [id number] DOB: 12-13-1990 Today's Date: 03/10/2022   History of Present Illness Timothy Hubbard is a 31 y/o man admitted secondary to multiple seizure like episodes and AMS. He sustained a fall at home, denied hitting head, with teeth grinding and apparent seizure activity for 7 min.  Was altered, lethargic and confused afterwards.  Additional seizure about 1 hour later, and another 1 hr later.  Brought to the ED following the third episode. PMH including but not limited to hx of HTN, asthma, GERD, ETOH use.    PT Comments    Pt was seen for a short session in which his family was in to visit, after session began. His mother was in using a chair for mobility, which was adjacent to his pathway to walk.  Pt used RW with min assist and clsoe cues for speed and safety of turns, in which he was attending to the task.  Pt became agitated after the return to the room with walker in which his mother's chair was moved to a more central place on the floor.  He tried to lift his walker suddenly over the top, and had to get pt to set it down as he became agitated at being asked to do so.  His mother wanted to take him from PT and asked her to let PT finish the task.  Pt set aside the walker to get the catheter bag from it and PT completed HHA walk to the chair.  PA from CIR arrived and talked with him about the task being newer for pt and that it was not a predictable challenge for pt to have the chair obstacle.  Pt was apologetic afterward.    Recommendations for follow up therapy are one component of a multi-disciplinary discharge planning process, led by the attending physician.  Recommendations may be updated based on patient status, additional functional criteria and insurance authorization.  Follow Up Recommendations  Acute inpatient rehab (3hours/day)     Assistance Recommended at Discharge Frequent or constant Supervision/Assistance   Patient can return home with the following A lot of help with walking and/or transfers;A little help with bathing/dressing/bathroom;Assistance with cooking/housework;Direct supervision/assist for medications management;Direct supervision/assist for financial management;Assist for transportation;Help with stairs or ramp for entrance   Equipment Recommendations  Rolling walker (2 wheels)    Recommendations for Other Services Rehab consult     Precautions / Restrictions Precautions Precautions: Fall;Other (comment) Precaution Comments: seizure Restrictions Weight Bearing Restrictions: No     Mobility  Bed Mobility               General bed mobility comments: in chair when PT arrived    Transfers Overall transfer level: Needs assistance Equipment used: Rolling walker (2 wheels), 1 person hand held assist Transfers: Sit to/from Stand Sit to Stand: Min assist, From elevated surface (with cues for sequence)                Ambulation/Gait Ambulation/Gait assistance: Min assist Gait Distance (Feet): 60 Feet Assistive device: Rolling walker (2 wheels), 1 person hand held assist Gait Pattern/deviations: Step-through pattern, Decreased stride length, Wide base of support Gait velocity: decreased Gait velocity interpretation: <1.31 ft/sec, indicative of household ambulator Pre-gait activities: standing balance ck General Gait Details: walker was helpful but when PT returned to his room the pt's mother had a chair in the center of the room.  Pt became immediately reactive and tried to lift the walker over the  chair.  As PT asked him to put the walker down, he got a bit agitated and upset.  His mother tried to take over and get between pt and therapist, and had to ask her to let PT finish with him.  Pt became less reactive, walker removed and finished with HHA to chair   Stairs             Wheelchair Mobility    Modified Rankin (Stroke Patients Only)        Balance Overall balance assessment: Needs assistance Sitting-balance support: Feet supported Sitting balance-Leahy Scale: Fair   Postural control: Posterior lean Standing balance support: Bilateral upper extremity supported Standing balance-Leahy Scale: Fair Standing balance comment: less than fair once dynamically moving                            Cognition Arousal/Alertness: Awake/alert Behavior During Therapy: Impulsive Overall Cognitive Status: Impaired/Different from baseline Area of Impairment: Problem solving, Awareness, Safety/judgement, Following commands, Attention, Orientation                 Orientation Level: Situation, Place Current Attention Level: Selective, Sustained   Following Commands: Follows one step commands with increased time Safety/Judgement: Decreased awareness of safety, Decreased awareness of deficits Awareness: Emergent, Intellectual Problem Solving: Slow processing, Requires verbal cues, Requires tactile cues General Comments: decreased safety and requires repetitive information but also guarding from being overstimulated        Exercises      General Comments General comments (skin integrity, edema, etc.): pt was able to fairly adequately use RW, tends to need reminders of speed and to slow down on the curves/turns.  This was new for him      Pertinent Vitals/Pain Pain Assessment Pain Assessment: No/denies pain    Home Living                          Prior Function            PT Goals (current goals can now be found in the care plan section) Acute Rehab PT Goals Patient Stated Goal: none stated Progress towards PT goals: Progressing toward goals    Frequency    Min 3X/week      PT Plan Current plan remains appropriate    Co-evaluation              AM-PAC PT "6 Clicks" Mobility   Outcome Measure  Help needed turning from your back to your side while in a flat bed without using  bedrails?: A Little Help needed moving from lying on your back to sitting on the side of a flat bed without using bedrails?: A Little Help needed moving to and from a bed to a chair (including a wheelchair)?: A Little Help needed standing up from a chair using your arms (e.g., wheelchair or bedside chair)?: A Little Help needed to walk in hospital room?: A Little Help needed climbing 3-5 steps with a railing? : Total 6 Click Score: 16    End of Session Equipment Utilized During Treatment: Gait belt Activity Tolerance: Patient tolerated treatment well;Treatment limited secondary to agitation Patient left: in chair;with call bell/phone within reach;with chair alarm set;Other (comment) Nurse Communication: Mobility status PT Visit Diagnosis: Unsteadiness on feet (R26.81);Other abnormalities of gait and mobility (R26.89);Muscle weakness (generalized) (M62.81);Difficulty in walking, not elsewhere classified (R26.2)     Time: 2778-2423 PT Time Calculation (min) (ACUTE ONLY):  11 min  Charges:  $Gait Training: 8-22 mins   Ramond Dial 03/10/2022, 1:01 PM  Mee Hives, PT PhD Acute Rehab Dept. Number: [place] and [place]

## 2022-03-10 NOTE — Progress Notes (Signed)
Subjective: No acute events overnight.  Per mother at bedside, patient appears pretty close to baseline.  Denies any concerns.  ROS: negative except above  Examination  Vital signs in last 24 hours: Temp:  [98 F (36.7 C)-98.7 F (37.1 C)] 98.4 F (36.9 C) (07/06 1237) Pulse Rate:  [86-108] 108 (07/06 1237) Resp:  [16-18] 16 (07/06 1237) BP: (115-144)/(86-111) 141/111 (07/06 1237) SpO2:  [97 %-100 %] 100 % (07/06 1237)  General: Sitting in chair, not in apparent distress Neuro: MS: Alert, oriented, follows commands CN: pupils equal and reactive,  EOMI, face symmetric, tongue midline, normal sensation over face Motor: 5/5 strength in all 4 extremities  Basic Metabolic Panel: Recent Labs  Lab 03/06/22 0310 03/07/22 0255 03/08/22 0039 03/09/22 0124 03/10/22 0703  NA 141 142 142 141 144  K 3.3* 3.1* 3.1* 3.3* 3.6  CL 114* 114* 114* 116* 117*  CO2 19* 20* 20* 19* 22  GLUCOSE 89 85 93 88 95  BUN <5* <5* <5* 6 6  CREATININE 0.68 0.63 0.66 0.54* 0.56*  CALCIUM 8.4* 8.2* 7.8* 7.7* 8.1*  MG  --   --   --   --  1.6*    CBC: Recent Labs  Lab 03/06/22 0310 03/07/22 0255 03/08/22 0039 03/09/22 0124 03/10/22 0703  WBC 8.7 9.3 9.1 8.0 6.7  HGB 9.4* 9.3* 8.8* 8.5* 9.0*  HCT 29.1* 31.0* 28.9* 28.1* 29.9*  MCV 78.9* 79.7* 81.2 82.2 82.8  PLT 186 195 191 198 227     Coagulation Studies: No results for input(s): "LABPROT", "INR" in the last 72 hours.  Imaging No new brain imaging overnight   ASSESSMENT AND PLAN: 31 year old male with history of substance use disorder who presented with new onset seizures and continues to be aphasic.   New onset seizures Cocaine use disorder Cannabis use disorder Intermittent agitation Acute encephalopathy, toxic-metabolic, resolved -Likely provoked seizure in setting of alcohol use, cocaine use. - Also noted to have episodes if staring consistent with Non-epileptic event    Recommendations -Discontinue LTM EEG as no seizures  overnight -Continue Depakote 500 mg twice daily -Seizure precautions including do not drive -Follow-up with neurology 4 to 6 weeks. Patient presented with convulsive status epilepticus.  Therefore recommend continuing this for now.  If patient remains seizure-free, this can potentially be weaned off in future.    Seizure precautions: Per [facility] statutes, patients with seizures are not allowed to drive until they have been seizure-free for six months and cleared by a physician    Use caution when using heavy equipment or power tools. Avoid working on ladders or at heights. Take showers instead of baths. Ensure the water temperature is not too high on the home water heater. Do not go swimming alone. Do not lock yourself in a room alone (i.e. bathroom). When caring for infants or small children, sit down when holding, feeding, or changing them to minimize risk of injury to the child in the event you have a seizure. Maintain good sleep hygiene. Avoid alcohol.    If patient has another seizure, call 911 and bring them back to the ED if: A.  The seizure lasts longer than 5 minutes.      B.  The patient doesn't wake shortly after the seizure or has new problems such as difficulty seeing, speaking or moving following the seizure C.  The patient was injured during the seizure D.  The patient has a temperature over 102 F (39C) E.  The patient vomited during  the seizure and now is having trouble breathing    During the Seizure   - First, ensure adequate ventilation and place patients on the floor on their left side  Loosen clothing around the neck and ensure the airway is patent. If the patient is clenching the teeth, do not force the mouth open with any object as this can cause severe damage - Remove all items from the surrounding that can be hazardous. The patient may be oblivious to what's happening and may not even know what he or she is doing. If the patient is confused and wandering,  either gently guide him/her away and block access to outside areas - Reassure the individual and be comforting - Call 911. In most cases, the seizure ends before EMS arrives. However, there are cases when seizures may last over 3 to 5 minutes. Or the individual may have developed breathing difficulties or severe injuries. If a pregnant patient or a person with diabetes develops a seizure, it is prudent to call an ambulance. - Finally, if the patient does not regain full consciousness, then call EMS. Most patients will remain confused for about 45 to 90 minutes after a seizure, so you must use judgment in calling for help.   After the Seizure (Postictal Stage)   After a seizure, most patients experience confusion, fatigue, muscle pain and/or a headache. Thus, one should permit the individual to sleep. For the next few days, reassurance is essential. Being calm and helping reorient the person is also of importance.   Most seizures are painless and end spontaneously. Seizures are not harmful to others but can lead to complications such as stress on the lungs, brain and the heart. Individuals with prior lung problems may develop labored breathing and respiratory distress.    I have spent a total of  35 minutes with the patient reviewing hospital notes,  test results, labs and examining the patient as well as establishing an assessment and plan that was discussed personally with the patient and mother at bedside.  > 50% of time was spent in direct patient care.    Zeb Comfort Epilepsy [facility] For questions after 5pm please refer to AMION to reach the Neurologist on call

## 2022-03-10 NOTE — Procedures (Addendum)
MRN: [id number]  Epilepsy Attending: Lora Havens  Referring Physician/Provider: Amie Portland, MD  Duration: 03/09/2022 0919 to 03/10/2022 1011   Patient history: 31 year old man with new onset of multiple seizures. EEG to evaluate for seizure.   Level of alertness: awake, asleep   AEDs during EEG study: VPA   Technical aspects: This EEG study was done with scalp electrodes positioned according to the 10-20 International system of electrode placement. Electrical activity was acquired at a sampling rate of '500Hz'$  and reviewed with a high frequency filter of '70Hz'$  and a low frequency filter of '1Hz'$ . EEG data were recorded continuously and digitally stored.    Description: The posterior dominant rhythm consists of 8-9 Hz activity of moderate voltage (25-35 uV) seen predominantly in posterior head regions, symmetric and reactive to eye opening and eye closing. Sleep was characterized by vertex waves, sleep spindles (12 to 14 Hz), maximal frontocentral region. EEG also showed 15 to 18 Hz beta activity distributed symmetrically and diffusely. Hyperventilation and photic stimulation were not performed.      IMPRESSION: This study is within normal limits. No seizures or epileptiform discharges were seen throughout the recording.    Curran Lenderman Barbra Sarks

## 2022-03-10 NOTE — Plan of Care (Signed)
  Problem: Consults Goal: RH GENERAL PATIENT EDUCATION Description: See Patient Education module for education specifics. Outcome: Progressing   Problem: RH BLADDER ELIMINATION Goal: RH STG MANAGE BLADDER WITH ASSISTANCE Description: STG Manage Bladder With toileting Assistance Outcome: Progressing   Problem: RH SAFETY Goal: RH STG ADHERE TO SAFETY PRECAUTIONS W/ASSISTANCE/DEVICE Description: STG Adhere to Safety Precautions With cues Assistance/Device. Outcome: Progressing   Problem: RH KNOWLEDGE DEFICIT GENERAL Goal: RH STG INCREASE KNOWLEDGE OF SELF CARE AFTER HOSPITALIZATION Description: Patient and mother will be able to manage care at discharge using handouts and eduational resources independently Outcome: Progressing

## 2022-03-10 NOTE — Progress Notes (Signed)
Inpatient Rehab Admissions Coordinator:    I have insurance approval and a bed available for pt to admit to CIR today. Dr. Louanne Belton in agreement.  Will let pt/family and TOC team know.   Shann Medal, PT, DPT Admissions Coordinator [phone] 03/10/22  12:54 PM

## 2022-03-10 NOTE — H&P (Signed)
Physical Medicine and Rehabilitation Admission H&P    Chief Complaint  Patient presents with   Seizures  : HPI: Timothy Hubbard. Reifsteck is a 31 year old right-handed male with history of hypertension, asthma, alcohol/tobacco use on no prescription medications.  Per chart review patient lives with parent.  1 level home.  Independent/employed prior to admission.  Presented 03/01/2022 with reported multiple seizures and altered mental status.  I report patient had normal health then coming out of the bathroom stumbling and fell.  No head trauma.  CT/MRI showed no acute intracranial pathology.  Mild global parenchymal volume loss accelerated for age.  CT angiogram head and neck negative.  Admission chemistries lactic acid 7.5-9.0, creatinine 1.29, troponin negative, blood cultures no growth to date, WBC 12,800, urine drug screen positive cocaine benzos as well as marijuana.  Initial EEG moderate to severe diffuse encephalopathy no seizure and repeated with 24-hour EEG again negative for seizure.  Lumbar puncture completed with glucose 76, total protein past 6.  CSF culture no organisms seen.  VDRL nonreactive.  Initially he was loaded with Keppra as well as Depakote and has since been weaned to Depakote alone.  Seizure felt to be provoked secondary to polypharmacy/alcohol cessation.  Psychiatry consulted 03/08/2022 for ongoing confusion and has been placed on Zyprexa.  Hospital course bouts of hypokalemia as well as hypomagnesemia with supplement added.  Iron deficiency anemia 6.3 and he did receive 2 units packed red blood cells 03/05/2022.  Occult blood pending and latest hemoglobin 9.0.  He is tolerating a regular diet.  Therapy evaluations completed due to patient decreased functional mobility encephalopathy was admitted for a comprehensive rehab program.  Pt reports has condom catheter-  LBM this AM; little sore R side, but otherwise, no pain.  Peeing OK, but pt said is tea colored- nothing in bag, so not clear.      Review of Systems  Constitutional:  Positive for malaise/fatigue. Negative for chills and fever.  HENT:  Negative for hearing loss.   Eyes:  Negative for blurred vision and double vision.  Respiratory:  Negative for cough and shortness of breath.   Cardiovascular:  Negative for chest pain, palpitations and leg swelling.  Gastrointestinal:  Positive for constipation. Negative for heartburn, nausea and vomiting.  Genitourinary:  Negative for dysuria, flank pain and hematuria.  Musculoskeletal:  Positive for joint pain and myalgias.  Skin:  Negative for rash.  Neurological:  Positive for seizures and weakness.  All other systems reviewed and are negative.  Past Medical History:  Diagnosis Date   Asthma    HTN (hypertension)    No past surgical history on file. Family History  Problem Relation Age of Onset   Hypertension Mother    Cancer Maternal Grandfather        doesn't know the type   Social History:  reports that he has been smoking cigarettes. He started smoking about 14 years ago. He has a 4.00 pack-year smoking history. He has never used smokeless tobacco. He reports current alcohol use of about 2.0 standard drinks of alcohol per week. He reports current drug use. Frequency: 7.00 times per week. Drug: Marijuana. Allergies:  Allergies  Allergen Reactions   Ibuprofen Anaphylaxis   No medications prior to admission.      Home: Home Living Family/patient expects to be discharged to:: Private residence Living Arrangements: Parent Available Help at Discharge: Family, Available 24 hours/day Type of Home: House Home Access: Stairs to enter [employer] of Steps: 5 Entrance Stairs-Rails:  Can reach both Home Layout: One level Bathroom Shower/Tub: Optometrist: Yes Home Equipment: None  Lives With: Family   Functional History: Prior Function Prior Level of Function : Independent/Modified Independent,  Working/employed, Driving Mobility Comments: fully independent prior to admission ADLs Comments: independent prior to admission  Functional Status:  Mobility: Bed Mobility Overal bed mobility: Needs Assistance Bed Mobility: Supine to Sit Supine to sit: Min guard Sit to supine: Min guard, +2 for safety/equipment General bed mobility comments: for safety; able to achieve an upright sitting position at EOB towards his R side with use of bed rails Transfers Overall transfer level: Needs assistance Equipment used: 2 person hand held assist Transfers: Sit to/from Stand, Bed to chair/wheelchair/BSC Sit to Stand: Min assist, +2 safety/equipment Bed to/from chair/wheelchair/BSC transfer type:: Stand pivot Stand pivot transfers: Min assist, +2 safety/equipment General transfer comment: assistance for stability, safety and line management; pt able to complete one sit<>stand from EOB, pivot towards his left side to the [facility] and then one sit<>stand from [facility] Ambulation/Gait Ambulation/Gait assistance: Min assist, +2 safety/equipment Gait Distance (Feet): 5 Feet Assistive device: 2 person hand held assist Gait Pattern/deviations: Step-through pattern, Decreased stride length General Gait Details: pt able to ambulate a short distance from [facility] to recliner chair (limited secondary to contiuous EEG lines/monitoring); very unsteady but able to safely manage with min A x2 Gait velocity: decreased    ADL: ADL Overall ADL's : Needs assistance/impaired Eating/Feeding: Minimal assistance, Bed level Grooming: Minimal assistance, Standing, Oral care Upper Body Bathing: Minimal assistance, Bed level Lower Body Bathing: Maximal assistance, +2 for safety/equipment, +2 for physical assistance, Sit to/from stand, Sitting/lateral leans Upper Body Dressing : Minimal assistance, Bed level Upper Body Dressing Details (indicate cue type and reason): to don gown Lower Body Dressing: Moderate assistance, Bed  level Lower Body Dressing Details (indicate cue type and reason): to don socks Toilet Transfer: Stand-pivot, Ambulation, Minimal assistance, +2 for safety/equipment Toilet Transfer Details (indicate cue type and reason): to [facility] Toileting- Clothing Manipulation and Hygiene: Moderate assistance, Sit to/from stand Toileting - Clothing Manipulation Details (indicate cue type and reason): for pericare General ADL Comments: less impulsivity noted this session  Cognition: Cognition Overall Cognitive Status: Impaired/Different from baseline Orientation Level: Oriented X4 Cognition Arousal/Alertness: Awake/alert Behavior During Therapy: Impulsive Overall Cognitive Status: Impaired/Different from baseline Area of Impairment: Following commands, Safety/judgement, Awareness, Problem solving Following Commands: Follows one step commands with increased time Safety/Judgement: Decreased awareness of deficits, Decreased awareness of safety Awareness: Emergent ("states need to go use bathroom") Problem Solving: Slow processing, Decreased initiation, Difficulty sequencing, Requires verbal cues, Requires tactile cues General Comments: Decreased safety awareness and deficit awareness; states he is in [place], but tangential and needs assist to redirect Difficult to assess due to: Impaired communication (unintelligible speech ~25% of session)  Physical Exam: Blood pressure (!) 143/102, pulse 86, temperature 98.4 F (36.9 C), temperature source Oral, resp. rate 18, height '5\' 4"'$  (1.626 m), weight 57.1 kg, SpO2 100 %. Physical Exam Vitals and nursing note reviewed.  Constitutional:      Appearance: Normal appearance. He is normal weight.     Comments: Pt sitting up in bedside chair- appears older than stated age, NAD   HENT:     Head: Normocephalic and atraumatic.     Comments: No facial droop No tongue deviation    Right Ear: External ear normal.     Left Ear: External ear normal.     Nose: Nose  normal. No congestion.  Mouth/Throat:     Mouth: Mucous membranes are dry.     Pharynx: Oropharynx is clear. No oropharyngeal exudate.  Eyes:     General:        Right eye: No discharge.        Left eye: No discharge.     Extraocular Movements: Extraocular movements intact.     Comments: No nystagmus  Cardiovascular:     Rate and Rhythm: Regular rhythm. Tachycardia present.     Heart sounds: Normal heart sounds. No murmur heard.    No gallop.  Pulmonary:     Effort: Pulmonary effort is normal. No respiratory distress.     Breath sounds: Normal breath sounds. No wheezing, rhonchi or rales.  Abdominal:     General: Abdomen is flat. There is no distension.     Palpations: Abdomen is soft.     Tenderness: There is no abdominal tenderness.     Comments: Slightly hypoactive  Genitourinary:    Comments: Condom catheter in place- no urine in bag- just emptied Musculoskeletal:     Cervical back: Neck supple. No tenderness.     Comments: 5-/5 B/L in biceps, triceps, WE, grip and FA B/L  5-/5 in LE's except L HF was 4/5   Skin:    General: Skin is warm and dry.  Neurological:     Comments: Patient sitting up in chair.  Makes eye contact with examiner.  He is a bit anxious but does participate with exam.  Provided his name.  Follows simple commands. Very dysarthric- hard to understand at times Intact to light touch in all 4 extremities  Psychiatric:     Comments: Flat affect     Results for orders placed or performed during the hospital encounter of 03/01/22 (from the past 48 hour(s))  Comprehensive metabolic panel     Status: Abnormal   Collection Time: 03/09/22  1:24 AM  Result Value Ref Range   Sodium 141 135 - 145 mmol/L   Potassium 3.3 (L) 3.5 - 5.1 mmol/L   Chloride 116 (H) 98 - 111 mmol/L   CO2 19 (L) 22 - 32 mmol/L   Glucose, Bld 88 70 - 99 mg/dL    Comment: Glucose reference range applies only to samples taken after fasting for at least 8 hours.   BUN 6 6 - 20 mg/dL    Creatinine, Ser 0.54 (L) 0.61 - 1.24 mg/dL   Calcium 7.7 (L) 8.9 - 10.3 mg/dL   Total Protein 4.8 (L) 6.5 - 8.1 g/dL   Albumin 2.4 (L) 3.5 - 5.0 g/dL   AST 19 15 - 41 U/L   ALT 19 0 - 44 U/L   Alkaline Phosphatase 48 38 - 126 U/L   Total Bilirubin 0.8 0.3 - 1.2 mg/dL   GFR, Estimated >60 >60 mL/min    Comment: (NOTE) Calculated using the CKD-EPI Creatinine Equation (2021)    Anion gap 6 5 - 15    Comment: Performed at [facility], [place] [address] [place], [place] [address]  CBC     Status: Abnormal   Collection Time: 03/09/22  1:24 AM  Result Value Ref Range   WBC 8.0 4.0 - 10.5 K/uL   RBC 3.42 (L) 4.22 - 5.81 MIL/uL   Hemoglobin 8.5 (L) 13.0 - 17.0 g/dL   HCT 28.1 (L) 39.0 - 52.0 %   MCV 82.2 80.0 - 100.0 fL   MCH 24.9 (L) 26.0 - 34.0 pg   MCHC 30.2 30.0 - 36.0  g/dL   RDW 22.5 (H) 11.5 - 15.5 %   Platelets 198 150 - 400 K/uL   nRBC 0.0 0.0 - 0.2 %    Comment: Performed at [facility], [place] [address], [place], [place] [address]  CBC     Status: Abnormal   Collection Time: 03/10/22  7:03 AM  Result Value Ref Range   WBC 6.7 4.0 - 10.5 K/uL   RBC 3.61 (L) 4.22 - 5.81 MIL/uL   Hemoglobin 9.0 (L) 13.0 - 17.0 g/dL   HCT 29.9 (L) 39.0 - 52.0 %   MCV 82.8 80.0 - 100.0 fL   MCH 24.9 (L) 26.0 - 34.0 pg   MCHC 30.1 30.0 - 36.0 g/dL   RDW 22.6 (H) 11.5 - 15.5 %   Platelets 227 150 - 400 K/uL   nRBC 0.0 0.0 - 0.2 %    Comment: Performed at [facility], [place] [address] [place], [place] [address]  Basic metabolic panel     Status: Abnormal   Collection Time: 03/10/22  7:03 AM  Result Value Ref Range   Sodium 144 135 - 145 mmol/L   Potassium 3.6 3.5 - 5.1 mmol/L   Chloride 117 (H) 98 - 111 mmol/L   CO2 22 22 - 32 mmol/L   Glucose, Bld 95 70 - 99 mg/dL    Comment: Glucose reference range applies only to samples taken after fasting for at least 8 hours.   BUN 6 6 - 20 mg/dL   Creatinine, Ser 0.56 (L) 0.61 - 1.24 mg/dL   Calcium 8.1 (L) 8.9 - 10.3 mg/dL    GFR, Estimated >60 >60 mL/min    Comment: (NOTE) Calculated using the CKD-EPI Creatinine Equation (2021)    Anion gap 5 5 - 15    Comment: Performed at [facility] [address], [place], [place] [address]  Magnesium     Status: Abnormal   Collection Time: 03/10/22  7:03 AM  Result Value Ref Range   Magnesium 1.6 (L) 1.7 - 2.4 mg/dL    Comment: Performed at [facility] [address] [place], [place] [address]  Glucose, capillary     Status: None   Collection Time: 03/10/22  7:33 AM  Result Value Ref Range   Glucose-Capillary 94 70 - 99 mg/dL    Comment: Glucose reference range applies only to samples taken after fasting for at least 8 hours.   Overnight EEG with video  Result Date: 03/09/2022 Lora Havens, MD     03/09/2022 10:26 AM MRN: [id number] Epilepsy Attending: Lora Havens Referring Physician/Provider: Amie Portland, MD Duration: 03/08/2022 0919 to 03/09/2022 0919  Patient history: 31 year old man with new onset of multiple seizures. EEG to evaluate for seizure.  Level of alertness: awake, asleep  AEDs during EEG study: LEV  Technical aspects: This EEG study was done with scalp electrodes positioned according to the 10-20 International system of electrode placement. Electrical activity was acquired at a sampling rate of '500Hz'$  and reviewed with a high frequency filter of '70Hz'$  and a low frequency filter of '1Hz'$ . EEG data were recorded continuously and digitally stored.  Description: The posterior dominant rhythm consists of 8-9 Hz activity of moderate voltage (25-35 uV) seen predominantly in posterior head regions, symmetric and reactive to eye opening and eye closing. Sleep was characterized by vertex waves, sleep spindles (12 to 14 Hz), maximal frontocentral region. EEG also showed 15 to 18 Hz beta activity distributed symmetrically and diffusely.  Hyperventilation and photic stimulation were not performed.   Patient event was noted on 03/08/2022 at 0944 during which was  not responding.  Another similar episode was noted at 0958. This was witnessed by psych team and described as "suddenly stopped talking to me was looking off to the L side. I kept calling his name, and tried to get his attention but he would not respond. Then suddenly he turned back to me, and I asked him where we were located and had to reorient him. Concomitant EEG before but during and after the event showed normal posterior dominant rhythm.  IMPRESSION: This study is within normal limits. No seizures or epileptiform discharges were seen throughout the recording. Two events were noted on 03/08/2022 at 0958 as described above without concomitant EEG change.  This was not an epileptic event.  Priyanka O Yadav      Blood pressure (!) 143/102, pulse 86, temperature 98.4 F (36.9 C), temperature source Oral, resp. rate 18, height '5\' 4"'$  (1.626 m), weight 57.1 kg, SpO2 100 %.  Medical Problem List and Plan: 1. Functional deficits secondary to acute encephalopathy provoked secondary to polypharmacy/alcohol cessation.  -patient may  shower  -ELOS/Goals: 10-14 days- Supervision to min A 2.  Antithrombotics: -DVT/anticoagulation:  Mechanical: Antiembolism stockings, thigh (TED hose) Bilateral lower extremities  -antiplatelet therapy: N/A 3. Pain Management: Tylenol as needed 4. Mood/Sleep: Provide emotional support  -antipsychotic agents: Zyprexa 5 mg nightly 5. Neuropsych/cognition: This patient is not capable of making decisions on his own behalf. 6. Skin/Wound Care: Routine skin checks 7. Fluids/Electrolytes/Nutrition: Routine INO's with follow-up chemistries 8.  Seizure prophylaxis.  Depakote 500 mg twice daily.  EEG negative 9.  Hypertension.  Norvasc 5 mg daily 10.  Asthma.  Continue Dulera as well as nebulizer as needed.  Check oxygen saturations every shift 11.  History of alcohol tobacco abuse.  Counseling. 12.  Iron deficiency anemia.  Patient did receive 2 units packed red blood cell  03/05/2022.  Follow-up CBC 13.  GERD.  Protonix  I have personally performed a face to face diagnostic evaluation of this patient and formulated the key components of the plan.  Additionally, I have personally reviewed laboratory data, imaging studies, as well as relevant notes and concur with the physician assistant's documentation above.   The patient's status has not changed from the original H&P.  Any changes in documentation from the acute care chart have been noted above.      Lavon Paganini Angiulli, PA-C 03/10/2022

## 2022-03-10 NOTE — Progress Notes (Signed)
LTM EEG discontinued - no skin breakdown at unhook.   

## 2022-03-11 DIAGNOSIS — I10 Essential (primary) hypertension: Secondary | ICD-10-CM | POA: Diagnosis not present

## 2022-03-11 DIAGNOSIS — D509 Iron deficiency anemia, unspecified: Secondary | ICD-10-CM | POA: Diagnosis not present

## 2022-03-11 DIAGNOSIS — G9340 Encephalopathy, unspecified: Secondary | ICD-10-CM | POA: Diagnosis not present

## 2022-03-11 DIAGNOSIS — R770 Abnormality of albumin: Secondary | ICD-10-CM

## 2022-03-11 LAB — COMPREHENSIVE METABOLIC PANEL
ALT: 19 U/L (ref 0–44)
AST: 25 U/L (ref 15–41)
Albumin: 2.5 g/dL — ABNORMAL LOW (ref 3.5–5.0)
Alkaline Phosphatase: 60 U/L (ref 38–126)
Anion gap: 7 (ref 5–15)
BUN: 5 mg/dL — ABNORMAL LOW (ref 6–20)
CO2: 21 mmol/L — ABNORMAL LOW (ref 22–32)
Calcium: 8.2 mg/dL — ABNORMAL LOW (ref 8.9–10.3)
Chloride: 115 mmol/L — ABNORMAL HIGH (ref 98–111)
Creatinine, Ser: 0.59 mg/dL — ABNORMAL LOW (ref 0.61–1.24)
GFR, Estimated: >60 mL/min (ref >60)
Glucose, Bld: 88 mg/dL (ref 70–99)
Potassium: 3.9 mmol/L (ref 3.5–5.1)
Sodium: 143 mmol/L (ref 135–145)
Total Bilirubin: 0.6 mg/dL (ref 0.3–1.2)
Total Protein: 5.2 g/dL — ABNORMAL LOW (ref 6.5–8.1)

## 2022-03-11 LAB — CBC WITH DIFFERENTIAL/PLATELET
Abs Immature Granulocytes: 0 10*3/uL (ref 0.00–0.07)
Basophils Absolute: 0.1 10*3/uL (ref 0.0–0.1)
Basophils Relative: 1 %
Eosinophils Absolute: 0 10*3/uL (ref 0.0–0.5)
Eosinophils Relative: 0 %
HCT: 29.3 % — ABNORMAL LOW (ref 39.0–52.0)
Hemoglobin: 8.7 g/dL — ABNORMAL LOW (ref 13.0–17.0)
Lymphocytes Relative: 25 %
Lymphs Abs: 2.1 10*3/uL (ref 0.7–4.0)
MCH: 24.6 pg — ABNORMAL LOW (ref 26.0–34.0)
MCHC: 29.7 g/dL — ABNORMAL LOW (ref 30.0–36.0)
MCV: 83 fL (ref 80.0–100.0)
Monocytes Absolute: 0.3 10*3/uL (ref 0.1–1.0)
Monocytes Relative: 4 %
Neutro Abs: 5.8 10*3/uL (ref 1.7–7.7)
Neutrophils Relative %: 70 %
Platelets: 244 10*3/uL (ref 150–400)
RBC: 3.53 MIL/uL — ABNORMAL LOW (ref 4.22–5.81)
RDW: 23.2 % — ABNORMAL HIGH (ref 11.5–15.5)
WBC: 8.3 10*3/uL (ref 4.0–10.5)
nRBC: 0 % (ref 0.0–0.2)
nRBC: 0 /100 WBC

## 2022-03-11 MED ORDER — HYDROCORTISONE (PERIANAL) 2.5 % EX CREA
TOPICAL_CREAM | Freq: Two times a day (BID) | CUTANEOUS | Status: DC
Start: 2022-03-11 — End: 2022-03-19
  Filled 2022-03-11 (×2): qty 28.35

## 2022-03-11 MED ORDER — NICOTINE 21 MG/24HR TD PT24
21.0000 mg | MEDICATED_PATCH | Freq: Every day | TRANSDERMAL | Status: DC
  Administered 2022-03-11 – 2022-03-18 (×8): 21 mg via TRANSDERMAL
  Filled 2022-03-11 (×9): qty 1

## 2022-03-11 MED ORDER — DIVALPROEX SODIUM 125 MG PO CSDR
500.0000 mg | DELAYED_RELEASE_CAPSULE | Freq: Two times a day (BID) | ORAL | Status: DC
  Administered 2022-03-12 – 2022-03-19 (×14): 500 mg via ORAL
  Filled 2022-03-11 (×17): qty 4

## 2022-03-11 NOTE — Progress Notes (Signed)
PROGRESS NOTE   Subjective/Complaints: Pt in bed working with therapy. Reports doing better each day.  ROS Review of Systems  Respiratory:  Negative for shortness of breath.   Cardiovascular:  Negative for chest pain.  Gastrointestinal:  Negative for abdominal pain.  Neurological:  Negative for dizziness and headaches.      Objective:   Overnight EEG with video  Result Date: 03/09/2022 Lora Havens, MD     03/09/2022 10:26 AM MRN: [id number] Epilepsy Attending: Lora Havens Referring Physician/Provider: Amie Portland, MD Duration: 03/08/2022 0919 to 03/09/2022 0919  Patient history: 31 year old man with new onset of multiple seizures. EEG to evaluate for seizure.  Level of alertness: awake, asleep  AEDs during EEG study: LEV  Technical aspects: This EEG study was done with scalp electrodes positioned according to the 10-20 International system of electrode placement. Electrical activity was acquired at a sampling rate of '500Hz'$  and reviewed with a high frequency filter of '70Hz'$  and a low frequency filter of '1Hz'$ . EEG data were recorded continuously and digitally stored.  Description: The posterior dominant rhythm consists of 8-9 Hz activity of moderate voltage (25-35 uV) seen predominantly in posterior head regions, symmetric and reactive to eye opening and eye closing. Sleep was characterized by vertex waves, sleep spindles (12 to 14 Hz), maximal frontocentral region. EEG also showed 15 to 18 Hz beta activity distributed symmetrically and diffusely. Hyperventilation and photic stimulation were not performed.   Patient event was noted on 03/08/2022 at 0944 during which was not responding.  Another similar episode was noted at 0958. This was witnessed by psych team and described as "suddenly stopped talking to me was looking off to the L side. I kept calling his name, and tried to get his attention but he would not respond. Then suddenly he  turned back to me, and I asked him where we were located and had to reorient him. Concomitant EEG before but during and after the event showed normal posterior dominant rhythm.  IMPRESSION: This study is within normal limits. No seizures or epileptiform discharges were seen throughout the recording. Two events were noted on 03/08/2022 at 0958 as described above without concomitant EEG change.  This was not an epileptic event.  Lora Havens   Recent Labs    03/10/22 0703 03/11/22 0502  WBC 6.7 8.3  HGB 9.0* 8.7*  HCT 29.9* 29.3*  PLT 227 244   Recent Labs    03/10/22 0703 03/11/22 0502  NA 144 143  K 3.6 3.9  CL 117* 115*  CO2 22 21*  GLUCOSE 95 88  BUN 6 5*  CREATININE 0.56* 0.59*  CALCIUM 8.1* 8.2*    Intake/Output Summary (Last 24 hours) at 03/11/2022 0749 Last data filed at 03/10/2022 1800 Gross per 24 hour  Intake 240 ml  Output --  Net 240 ml        Physical Exam: Vital Signs Blood pressure (!) 155/90, pulse 89, temperature 98 F (36.7 C), temperature source Oral, resp. rate 18, height '5\' 4"'$  (1.626 m), weight 57.1 kg, SpO2 99 %.  Physical Exam Vitals and nursing note reviewed.  Constitutional:      Appearance: Normal appearance. He  is normal weight.     Comments: Pt sitting up in Bed, NAD   HENT:     Head: Normocephalic and atraumatic.     Comments: No facial droop No tongue deviation    Right Ear: External ear normal.     Left Ear: External ear normal.     Nose: Nose normal. No congestion.     Mouth/Throat:     Mouth: Mucous membranes are moist    Pharynx: Oropharynx is clear. No oropharyngeal exudate.  Eyes:     General:        Right eye: No discharge.        Left eye: No discharge.     Extraocular Movements: Extraocular movements intact.     Comments: No nystagmus  Cardiovascular:     Rate and Rhythm: Regular rhythm. Tachycardia present.     Heart sounds: Normal heart sounds. No murmur heard.    No gallop.  Pulmonary:     Effort: Pulmonary  effort is normal. No respiratory distress.     Breath sounds: Normal breath sounds. No wheezing, rhonchi or rales.  Abdominal:     General: Abdomen is flat. There is no distension.     Palpations: Abdomen is soft.     Tenderness: There is no abdominal tenderness.     Comments: + BS Genitourinary:    Comments: Condom catheter in place- no urine in bag- just emptied Musculoskeletal:     Cervical back: Neck supple. No tenderness.     Comments: 5-/5 B/L in biceps, triceps, WE, grip and FA B/L  5-/5 in LE's except L HF was 4/5   Skin:    General: Skin is warm and dry.  Neurological:     Comments:  Makes eye contact with examiner.  He is a bit anxious but does participate with exam.  Provided his name.  Follows simple commands. Very dysarthric- hard to understand at times Intact to light touch in all 4 extremities  Psychiatric:     Comments: Flat affect   Assessment/Plan: 1. Functional deficits which require 3+ hours per day of interdisciplinary therapy in a comprehensive inpatient rehab setting. Physiatrist is providing close team supervision and 24 hour management of active medical problems listed below. Physiatrist and rehab team continue to assess barriers to discharge/monitor patient progress toward functional and medical goals  Care Tool:  Bathing              Bathing assist       Upper Body Dressing/Undressing Upper body dressing        Upper body assist      Lower Body Dressing/Undressing Lower body dressing            Lower body assist       Toileting Toileting    Toileting assist Assist for toileting: Contact Guard/Touching assist     Transfers Chair/bed transfer  Transfers assist           Locomotion Ambulation   Ambulation assist              Walk 10 feet activity   Assist           Walk 50 feet activity   Assist           Walk 150 feet activity   Assist           Walk 10 feet on uneven surface   activity   Assist           Wheelchair  Assist               Wheelchair 50 feet with 2 turns activity    Assist            Wheelchair 150 feet activity     Assist          Blood pressure (!) 155/90, pulse 89, temperature 98 F (36.7 C), temperature source Oral, resp. rate 18, height '5\' 4"'$  (1.626 m), weight 57.1 kg, SpO2 99 %.    Medical Problem List and Plan: 1. Functional deficits secondary to acute encephalopathy provoked secondary to polypharmacy/alcohol cessation.             -patient may  shower             -ELOS/Goals: 10-14 days- Supervision to min A  -PT/OT/SLP evaluations 2.  Antithrombotics: -DVT/anticoagulation:  Mechanical: Antiembolism stockings, thigh (TED hose) Bilateral lower extremities             -antiplatelet therapy: N/A 3. Pain Management: Tylenol as needed 4. Mood/Sleep: Provide emotional support             -antipsychotic agents: Zyprexa 5 mg nightly 5. Neuropsych/cognition: This patient is not capable of making decisions on his own behalf. 6. Skin/Wound Care: Routine skin checks 7. Fluids/Electrolytes/Nutrition: Routine INO's with follow-up chemistries 8.  Seizure prophylaxis.  Depakote 500 mg twice daily.  EEG negative 9.  Hypertension.  Norvasc 5 mg daily -Intermittent mildly elevated BP, monitor trend 10.  Asthma.  Continue Dulera as well as nebulizer as needed.  Check oxygen saturations every shift 11.  History of alcohol tobacco abuse.  Counseling. 12.  Iron deficiency anemia.  Patient did receive 2 units packed red blood cell 03/05/2022.  Follow-up CBC stable at 8.7 on 03/11/22 13.  GERD.  Protonix 14/ Low albumin  -Monitor PO intake, discussed eating foods with protein    LOS: 1 days A FACE TO FACE EVALUATION WAS PERFORMED  Jennye Boroughs 03/11/2022, 7:49 AM

## 2022-03-11 NOTE — Evaluation (Signed)
Occupational Therapy Assessment and Plan  Patient Details  Name: Timothy Hubbard MRN: [id number] Date of Birth: Jan 09, 1991  OT Diagnosis: ataxia, cognitive deficits, and disturbance of vision Rehab Potential: Rehab Potential (ACUTE ONLY): Good ELOS: 10-14   Today's Date: 03/11/2022 OT Individual Time: 1300-1415 OT Individual Time Calculation (min): 75 min     Hospital Problem: Principal Problem:   Encephalopathy acute Active Problems:   Acute encephalopathy   Past Medical History:  Past Medical History:  Diagnosis Date   Asthma    HTN (hypertension)    Past Surgical History: History reviewed. No pertinent surgical history.  Assessment & Plan Clinical Impression: Timothy Hubbard is a 31 year old right-handed male with history of hypertension, asthma, alcohol/tobacco use on no prescription medications.  Per chart review patient lives with parent.  1 level home.  Independent/employed prior to admission.  Presented 03/01/2022 with reported multiple seizures and altered mental status.  I report patient had normal health then coming out of the bathroom stumbling and fell.  No head trauma.  CT/MRI showed no acute intracranial pathology.  Mild global parenchymal volume loss accelerated for age.  CT angiogram head and neck negative.  Admission chemistries lactic acid 7.5-9.0, creatinine 1.29, troponin negative, blood cultures no growth to date, WBC 12,800, urine drug screen positive cocaine benzos as well as marijuana.  Initial EEG moderate to severe diffuse encephalopathy no seizure and repeated with 24-hour EEG again negative for seizure.  Lumbar puncture completed with glucose 76, total protein past 6.  CSF culture no organisms seen.  VDRL nonreactive.  Initially he was loaded with Keppra as well as Depakote and has since been weaned to Depakote alone.  Seizure felt to be provoked secondary to polypharmacy/alcohol cessation.  Psychiatry consulted 03/08/2022 for ongoing confusion and has been placed on  Zyprexa.  Hospital course bouts of hypokalemia as well as hypomagnesemia with supplement added.  Iron deficiency anemia 6.3 and he did receive 2 units packed red blood cells 03/05/2022.  Occult blood pending and latest hemoglobin 9.0.  He is tolerating a regular diet.  Therapy evaluations completed due to patient decreased functional mobility encephalopathy was admitted for a comprehensive rehab program..  Patient transferred to CIR on 03/10/2022 .    Patient currently requires min with basic self-care skills secondary to muscle weakness, decreased visual perceptual skills,  ,  , and decreased sitting balance, decreased standing balance, decreased postural control, and decreased balance strategies.  Prior to hospitalization, patient could complete ADLs, IADLs, functional mobility and transfers Independently.  Patient will benefit from skilled intervention to increase independence with basic self-care skills prior to discharge home with care partner.  Anticipate patient will require 24 hour supervision and follow up home health.  OT - End of Session Activity Tolerance: Endurance does not limit participation in activity Endurance Deficit: No OT Assessment Rehab Potential (ACUTE ONLY): Good OT Patient demonstrates impairments in the following area(s): Balance;Cognition;Perception;Motor;Safety;Vision OT Basic ADL's Functional Problem(s): Grooming;Bathing;Dressing;Toileting OT Advanced ADL's Functional Problem(s): Simple Meal Preparation;Light Housekeeping OT Transfers Functional Problem(s): Toilet;Tub/Shower OT Plan OT Intensity: Minimum of 1-2 x/day, 45 to 90 minutes OT Frequency: 5 out of 7 days OT Duration/Estimated Length of Stay: 10-14 OT Treatment/Interventions: Balance/vestibular training;Community reintegration;Neuromuscular re-education;Patient/family education;Self Care/advanced ADL retraining;UE/LE Coordination activities;Therapeutic Exercise;Therapeutic Activities;Functional mobility  training;Discharge planning;Cognitive remediation/compensation;DME/adaptive equipment instruction;Psychosocial support OT Basic Self-Care Anticipated Outcome(s): Supervision OT Toileting Anticipated Outcome(s): Supervision OT Bathroom Transfers Anticipated Outcome(s): Supervision OT Recommendation Patient destination: Home Follow Up Recommendations: Home health OT Equipment Recommended: Tub/shower bench  OT Evaluation Precautions/Restrictions  Precautions Precautions: Fall;Other (comment) Precaution Comments: seizure Restrictions Weight Bearing Restrictions: No General Chart Reviewed: Yes   Home Living/Prior [place] expects to be discharged to:: Private residence Living Arrangements: Parent, Other relatives Available Help at Discharge: Family, Available 24 hours/day Type of Home: House Home Access: Stairs to enter [employer] of Steps: 5 Entrance Stairs-Rails: Can reach both Home Layout: One level Bathroom Shower/Tub: Optometrist: Yes  Lives With: Family Prior Function Level of Independence: Independent with gait, Independent with basic ADLs, Independent with homemaking with ambulation, Independent with transfers  Able to Take Stairs?: Yes Driving: Yes Vocation: Full time employment 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[employer], night shift Vision Baseline Vision/History: 0 No visual deficits Ability to See in Adequate Light: 0 Adequate Patient Visual Report: Blurring of vision;Diplopia Vision Assessment?: Yes Eye Alignment: Within Functional Limits Ocular Range of Motion: Within Functional Limits Alignment/Gaze Preference: Within Defined Limits Tracking/Visual Pursuits: Able to track stimulus in all quads without difficulty Saccades: Within functional limits Convergence: Within functional limits Depth Perception: Overshoots Perception  Perception: Impaired Comments:  depth perception Praxis Praxis: Intact Cognition Cognition Overall Cognitive Status: Impaired/Different from baseline Arousal/Alertness: Awake/alert Orientation Level: Person;Place;Situation Person: Oriented Place: Oriented Situation: Oriented Memory: Impaired Memory Impairment: Decreased recall of new information Attention: Focused;Sustained;Selective Focused Attention: Appears intact Sustained Attention: Impaired Sustained Attention Impairment: Verbal basic Selective Attention: Impaired Selective Attention Impairment: Verbal basic Awareness: Impaired Awareness Impairment: Intellectual impairment Problem Solving: Impaired Problem Solving Impairment: Verbal basic Executive Function: Self Monitoring;Self Correcting;Reasoning Reasoning: Impaired Reasoning Impairment: Verbal basic Self Monitoring: Impaired Self Monitoring Impairment: Verbal basic Self Correcting: Impaired Self Correcting Impairment: Verbal basic Behaviors: Perseveration Safety/Judgment: Impaired Brief Interview for Mental Status (BIMS) Repetition of Three Words (First Attempt): 3 Temporal Orientation: Year: Correct Temporal Orientation: Month: Accurate within 5 days Temporal Orientation: Day: Correct Recall: "Sock": Yes, no cue required Recall: "Blue": Yes, no cue required Recall: "Bed": Yes, after cueing ("a piece of furniture") BIMS Summary Score: 14 Sensation Sensation Light Touch: Appears Intact Hot/Cold: Appears Intact Proprioception: Appears Intact Stereognosis: Not tested Coordination Gross Motor Movements are Fluid and Coordinated: No Fine Motor Movements are Fluid and Coordinated: No Coordination and Movement Description: delayed, decreased righting reactions Motor  Motor Motor: Other (comment) Motor - Skilled Clinical Observations: uncoordinated  Trunk/Postural Assessment  Cervical Assessment Cervical Assessment: Within Functional Limits Thoracic Assessment Thoracic Assessment: Within  Functional Limits Lumbar Assessment Lumbar Assessment: Within Functional Limits Postural Control Postural Control: Deficits on evaluation Righting Reactions: delayed  Balance Balance Balance Assessed: Yes Static Sitting Balance Static Sitting - Balance Support: Feet supported Static Sitting - Level of Assistance: 5: Stand by assistance Dynamic Sitting Balance Dynamic Sitting - Balance Support: Feet supported Dynamic Sitting - Level of Assistance: 5: Stand by assistance (CGA) Dynamic Sitting - Balance Activities: Reaching for objects Static Standing Balance Static Standing - Balance Support: During functional activity Static Standing - Level of Assistance: 5: Stand by assistance (CGA) Dynamic Standing Balance Dynamic Standing - Balance Support: During functional activity Dynamic Standing - Level of Assistance: 4: Min assist Dynamic Standing - Balance Activities: Reaching for objects Extremity/Trunk Assessment RUE Assessment RUE Assessment: Within Functional Limits General Strength Comments: 4/5 LUE Assessment LUE Assessment: Within Functional Limits General Strength Comments: 4/5  Care Tool Care Tool Self Care Eating   Eating Assist Level: Independent    Oral Care    Oral Care Assist Level: Contact Guard/Toucning assist    Bathing   Body parts  bathed by patient: Right arm;Left arm;Chest;Abdomen;Front perineal area;Buttocks;Right upper leg;Left upper leg;Right lower leg;Left lower leg;Face     Assist Level: Minimal Assistance - Patient > 75%    Upper Body Dressing(including orthotics)   What is the patient wearing?: Pull over shirt   Assist Level: Contact Guard/Touching assist    Lower Body Dressing (excluding footwear)   What is the patient wearing?: Pants;Underwear/pull up Assist for lower body dressing: Minimal Assistance - Patient > 75%    Putting on/Taking off footwear   What is the patient wearing?: Socks;Shoes Assist for footwear: Contact Guard/Touching  assist       Care Tool Toileting Toileting activity   Assist for toileting: Minimal Assistance - Patient > 75%     Care Tool Bed Mobility Roll left and right activity   Roll left and right assist level: Supervision/Verbal cueing    Sit to lying activity   Sit to lying assist level: Supervision/Verbal cueing    Lying to sitting on side of bed activity   Lying to sitting on side of bed assist level: the ability to move from lying on the back to sitting on the side of the bed with no back support.: Supervision/Verbal cueing     Care Tool Transfers Sit to stand transfer   Sit to stand assist level: Contact Guard/Touching assist    Chair/bed transfer   Chair/bed transfer assist level: Minimal Assistance - Patient > 75%     Toilet transfer   Assist Level: Contact Guard/Touching assist     Care Tool Cognition  Expression of Ideas and Wants Expression of Ideas and Wants: 3. Some difficulty - exhibits some difficulty with expressing needs and ideas (e.g, some words or finishing thoughts) or speech is not clear  Understanding Verbal and Non-Verbal Content Understanding Verbal and Non-Verbal Content: 3. Usually understands - understands most conversations, but misses some part/intent of message. Requires cues at times to understand   Memory/Recall Ability Memory/Recall Ability : Current season;That he or she is in a hospital/hospital unit;Location of own room;Staff names and faces   Refer to Care Plan for Long Term Goals  SHORT TERM GOAL WEEK 1 OT Short Term Goal 1 (Week 1): Pt to be CGA for functional transfers OT Short Term Goal 2 (Week 1): Pt to be CGA for LB bathing/dressiong OT Short Term Goal 3 (Week 1): Pt to demonstrate improved perception during self care with min verbal cues OT Short Term Goal 4 (Week 1): Pt to demonstrate improved safety awareness with mod verbal cues provided to reduce risk for falls OT Short Term Goal 5 (Week 1): Pt to be CGA for  toileting  Recommendations for other services: None    Skilled Therapeutic Intervention ADL ADL Eating: Independent Where Assessed-Eating: Bed level Grooming: Contact guard Where Assessed-Grooming: Standing at sink Upper Body Bathing: Contact guard Where Assessed-Upper Body Bathing: Shower Lower Body Bathing: Contact guard Where Assessed-Lower Body Bathing: Shower Upper Body Dressing: Contact guard Where Assessed-Upper Body Dressing:  (shower) Lower Body Dressing: Minimal assistance (for balance) Where Assessed-Lower Body Dressing: Edge of bed (shower) Toileting: Minimal assistance (simulated) Where Assessed-Toileting: Glass blower/designer: Therapist, music Method: Counselling psychologist: Energy manager: Environmental education officer Method: Heritage manager: Primary school teacher Sit to Stand: Contact Guard/Touching assist Stand to Sit: Contact Guard/Touching assist  OT evaluation initiated. Educated on role of OT, POC, DME recommendations, discharge recommendations. Pt completes shower ADL at the levels above.  Pt limited primarily by visual deficits, decreased cognition, and decreased balance and would benefit from continued OT services to achieve highest level of independence. Pt reports back and flank pain at 6/10 and requests no pain medication. At end of session, pt left in bed with RN present.   Discharge Criteria: Patient will be discharged from OT if patient refuses treatment 3 consecutive times without medical reason, if treatment goals not met, if there is a change in medical status, if patient makes no progress towards goals or if patient is discharged from hospital.  The above assessment, treatment plan, treatment alternatives and goals were discussed and mutually agreed upon: by patient  Marvetta Gibbons 03/11/2022, 5:03 PM

## 2022-03-11 NOTE — Progress Notes (Signed)
PMR Admission Coordinator Pre-Admission Assessment   Patient: Timothy Hubbard is an 31 y.o., male MRN: [id number] DOB: Sep 12, 1990 Height: 5' 4"  (162.6 cm) Weight: 57.1 kg   Insurance Information HMO:     PPO:      PCP:      IPA:      80/20:      OTHER:  PRIMARY: [place] Medicaid UHC      Policy#: [id number]      Subscriber: patient CM Name: Timothy Hubbard      Phone#: [phone]     Fax#: [phone] Pre-Cert#: [id number]  Datto for CIR from [facility] with [facility] Medicaid, updates due 7 days from admit (7/12) to fax listed above    Employer:  Benefits:  Phone #: [email or web address]     Name:  Eff. Date: 03/05/21-still active     Deduct: $0 (does not have deductible)      Out of Pocket Max: $0      Life Max: NA CIR: 100% coverage      SNF: 100% coverage Outpatient: 100% coverage for 27 visits/cal year     Co-Pay:  Home Health: 100% coverage      Co-Pay:  DME: 100% coverage     Co-Pay:  Providers: in-network SECONDARY:       Policy#:      Phone#:    Development worker, community:       Phone#:    The Therapist, art Information Summary" for patients in Inpatient Rehabilitation Facilities with attached "Privacy Act [place] Records" was provided and verbally reviewed with: N/A   Emergency Contact Information Contact Information       Name Relation Home Work Mobile    Deese,Jacqueline Mother [phone]               Current Medical History  Patient Admitting Diagnosis: encephalopathy, seizures   History of Present Illness: Timothy Hubbard is a 31 year old right-handed male with history of hypertension, asthma, alcohol/tobacco use on no prescription medications.  Presented 03/01/2022 with reported multiple seizures and altered mental status.  Per report patient had normal health then coming out of the bathroom stumbling and fell.  No head trauma.  CT/MRI showed no acute intracranial pathology.  Mild global parenchymal volume loss accelerated for age.  CT angiogram head and neck negative.  Admission  chemistries lactic acid 7.5-9.0, creatinine 1.29, troponin negative, blood cultures no growth to date, WBC 12,800, urine drug screen positive cocaine benzos as well as marijuana.  Initial EEG moderate to severe diffuse encephalopathy no seizure and repeated with 24-hour EEG again negative for seizure.  Lumbar puncture completed with glucose 76, total protein past 6.  CSF culture no organisms seen.  VDRL nonreactive.  Initially he was loaded with Keppra as well as Depakote and has since been weaned to Depakote alone.  Seizure felt to be provoked secondary to polypharmacy/alcohol cessation.  Psychiatry consulted 03/08/2022 for ongoing confusion and has been placed on Zyprexa.  Hospital course bouts of hypokalemia as well as hypomagnesemia with supplement added.  Iron deficiency anemia 6.3 and he did receive 2 units packed red blood cells 03/05/2022.  Occult blood pending and latest hemoglobin 9.0.  He is tolerating a regular diet.  Therapy evaluations completed and pt was recommended for a comprehensive rehab program.   Patient's medical record from [facility] has been reviewed by the rehabilitation admission coordinator and physician.   Past Medical History      Past Medical History:  Diagnosis Date   Asthma  HTN (hypertension)        Has the patient had major surgery during 100 days prior to admission? No   Family History   family history includes Cancer in his maternal grandfather; Hypertension in his mother.   Current Medications   Current Facility-Administered Medications:    acetaminophen (TYLENOL) 160 MG/5ML solution 650 mg, 650 mg, Oral, Q6H PRN, Little Ishikawa, MD   acetaminophen (TYLENOL) tablet 650 mg, 650 mg, Oral, Q6H PRN, Little Ishikawa, MD   albuterol (PROVENTIL) (2.5 MG/3ML) 0.083% nebulizer solution 3 mL, 3 mL, Inhalation, Q4H PRN, Collier Bullock, MD   amLODipine (NORVASC) tablet 5 mg, 5 mg, Oral, Daily, Pokhrel, Laxman, MD, 5 mg at 03/10/22 1221    cetaphil lotion, , Topical, Daily, Freida Busman, MD, Given at 03/10/22 0947   Chlorhexidine Gluconate Cloth 2 % PADS 6 each, 6 each, Topical, Q0600, Little Ishikawa, MD, 6 each at 03/10/22 0947   divalproex (DEPAKOTE) DR tablet 500 mg, 500 mg, Oral, BID, Zeb Comfort O, MD, 500 mg at 03/10/22 0940   docusate sodium (COLACE) capsule 100 mg, 100 mg, Oral, BID PRN, Collier Bullock, MD, 100 mg at 00/76/22 6333   folic acid injection 1 mg, 1 mg, Intravenous, Daily, Spero Geralds, MD, 1 mg at 03/10/22 0941   food thickener (SIMPLYTHICK (NECTAR/LEVEL 2/MILDLY THICK)) 10 packet, 10 packet, Oral, PRN, Little Ishikawa, MD   lip balm (BLISTEX) ointment, , Topical, PRN, Damita Dunnings B, MD   magnesium oxide (MAG-OX) tablet 400 mg, 400 mg, Oral, BID, Pokhrel, Laxman, MD, 400 mg at 03/10/22 1221   magnesium sulfate IVPB 2 g 50 mL, 2 g, Intravenous, Once, Pokhrel, Laxman, MD, Last Rate: 50 mL/hr at 03/10/22 1221, 2 g at 03/10/22 1221   midazolam (VERSED) injection 1-2 mg, 1-2 mg, Intravenous, Q1H PRN, Collier Bullock, MD, 2 mg at 03/07/22 1722   mometasone-formoterol (DULERA) 100-5 MCG/ACT inhaler 2 puff, 2 puff, Inhalation, BID, Collier Bullock, MD, 2 puff at 03/10/22 0819   multivitamin with minerals tablet 1 tablet, 1 tablet, Oral, Daily, Collier Bullock, MD, 1 tablet at 03/10/22 0940   OLANZapine (ZYPREXA) tablet 5 mg, 5 mg, Oral, QHS, Little Ishikawa, MD, 5 mg at 03/09/22 2111   Oral care mouth rinse, 15 mL, Mouth Rinse, 4 times per day, Spero Geralds, MD, 15 mL at 03/10/22 0846   Oral care mouth rinse, 15 mL, Mouth Rinse, PRN, Spero Geralds, MD   pantoprazole (PROTONIX) injection 40 mg, 40 mg, Intravenous, Q24H, Little Ishikawa, MD, 40 mg at 03/10/22 0940   polyethylene glycol (MIRALAX / GLYCOLAX) packet 17 g, 17 g, Oral, Daily PRN, Collier Bullock, MD   thiamine (B-1) injection 100 mg, 100 mg, Intravenous, Daily, Spero Geralds, MD, 100 mg at 03/10/22 0940   Patients  Current Diet:  Diet Order                  Diet general             Diet regular Room service appropriate? Yes; Fluid consistency: Thin  Diet effective now                         Precautions / Restrictions Precautions Precautions: Fall, Other (comment) Precaution Comments: seizure Restrictions Weight Bearing Restrictions: No    Has the patient had 2 or more falls or a fall with injury in the past year? Yes   Prior Activity Level Limited Community (  1-2x/wk): drives;gets out of house 1-2 days/week   Prior Functional Level Self Care: Did the patient need help bathing, dressing, using the toilet or eating? Independent   Indoor Mobility: Did the patient need assistance with walking from room to room (with or without device)? Independent   Stairs: Did the patient need assistance with internal or external stairs (with or without device)? Independent   Functional Cognition: Did the patient need help planning regular tasks such as shopping or remembering to take medications? Per mother, pt didn't take his medications   Patient Information Are you of Hispanic, Latino/a,or Spanish origin?: A. No, not of Hispanic, Latino/a, or Spanish origin What is your race?: B. Black or African American Do you need or want an interpreter to communicate with a doctor or health care staff?: 0. No   Patient's Response To:  Health Literacy and Transportation Is the patient able to respond to health literacy and transportation needs?: Yes Health Literacy - How often do you need to have someone help you when you read instructions, pamphlets, or other written material from your doctor or pharmacy?: Sometimes In the past 12 months, has lack of transportation kept you from medical appointments or from getting medications?: No In the past 12 months, has lack of transportation kept you from meetings, work, or from getting things needed for daily living?: No   Development worker, international aid / Equipment Home  Equipment: None   Prior Device Use: Indicate devices/aids used by the patient prior to current illness, exacerbation or injury? None of the above   Current Functional Level Cognition   Overall Cognitive Status: Impaired/Different from baseline Difficult to assess due to: Impaired communication (some verbalizations are not understandable) Current Attention Level: Selective, Sustained Orientation Level: Oriented X4 Following Commands: Follows one step commands with increased time Safety/Judgement: Decreased awareness of safety, Decreased awareness of deficits General Comments: decreased safety and requires repetitive information but also guarding from being overstimulated    Extremity Assessment (includes Sensation/Coordination)   Upper Extremity Assessment: Generalized weakness (impaired fine motor)  Lower Extremity Assessment: Defer to PT evaluation     ADLs   Overall ADL's : Needs assistance/impaired Eating/Feeding: Minimal assistance, Bed level Grooming: Minimal assistance, Standing, Oral care Upper Body Bathing: Minimal assistance, Bed level Lower Body Bathing: Maximal assistance, +2 for safety/equipment, +2 for physical assistance, Sit to/from stand, Sitting/lateral leans Upper Body Dressing : Minimal assistance, Bed level Upper Body Dressing Details (indicate cue type and reason): to don gown Lower Body Dressing: Moderate assistance, Bed level Lower Body Dressing Details (indicate cue type and reason): to don socks Toilet Transfer: Stand-pivot, Ambulation, Minimal assistance, +2 for safety/equipment Toilet Transfer Details (indicate cue type and reason): to [facility] Toileting- Clothing Manipulation and Hygiene: Moderate assistance, Sit to/from stand Toileting - Clothing Manipulation Details (indicate cue type and reason): for pericare General ADL Comments: less impulsivity noted this session     Mobility   Overal bed mobility: Needs Assistance Bed Mobility: Supine to Sit Supine  to sit: Min guard Sit to supine: Min guard, +2 for safety/equipment General bed mobility comments: in chair when PT arrived     Transfers   Overall transfer level: Needs assistance Equipment used: Rolling walker (2 wheels), 1 person hand held assist Transfers: Sit to/from Stand Sit to Stand: Min assist, From elevated surface (with cues for sequence) Bed to/from chair/wheelchair/BSC transfer type:: Stand pivot Stand pivot transfers: Min assist, +2 safety/equipment General transfer comment: assistance for stability, safety and line management; pt able to complete one  sit<>stand from EOB, pivot towards his left side to the [facility] and then one sit<>stand from [place] / Gait / Stairs / Wheelchair Mobility   Ambulation/Gait Ambulation/Gait assistance: Herbalist (Feet): 60 Feet Assistive device: Rolling walker (2 wheels), 1 person hand held assist Gait Pattern/deviations: Step-through pattern, Decreased stride length, Wide base of support General Gait Details: walker was helpful but when PT returned to his room the pt's mother had a chair in the center of the room.  Pt became immediately reactive and tried to lift the walker over the chair.  As PT asked him to put the walker down, he got a bit agitated and upset.  His mother tried to take over and get between pt and therapist, and had to ask her to let PT finish with him.  Pt became less reactive, walker removed and finished with HHA to chair Gait velocity: decreased Gait velocity interpretation: <1.31 ft/sec, indicative of household ambulator Pre-gait activities: standing balance ck     Posture / Balance Dynamic Sitting Balance Sitting balance - Comments: pt very impulsive requiring close min guard to minimize fall risk due to quick movement Balance Overall balance assessment: Needs assistance Sitting-balance support: Feet supported Sitting balance-Leahy Scale: Fair Sitting balance - Comments: pt very impulsive  requiring close min guard to minimize fall risk due to quick movement Postural control: Posterior lean Standing balance support: Bilateral upper extremity supported Standing balance-Leahy Scale: Fair Standing balance comment: pt minA for stability while brushing teeth at sink with OT     Special needs/care consideration Continuous Drip IV  mag running on admission, 1x/dose, Bladder incontinence, External urinary catheter    Previous Home Environment (from acute therapy documentation) Living Arrangements: Parent  Lives With: Family Available Help at Discharge: Family, Available 24 hours/day Type of Home: House Home Layout: One level Home Access: Stairs to enter Entrance Stairs-Rails: Can reach both Entrance Stairs-Number of Steps: 5 Bathroom Shower/Tub: Optometrist: Yes How Accessible: Accessible via walker Blue Eye: No   Discharge Living Setting Plans for Discharge Living Setting: Patient's home Type of Home at Discharge: House Discharge Home Layout: One level Discharge Home Access: Stairs to enter Entrance Stairs-Rails: Can reach both Entrance Stairs-Number of Steps: 5 Discharge Bathroom Shower/Tub: Tub/shower unit Discharge Bathroom Toilet: Standard Discharge Bathroom Accessibility: Yes How Accessible: Accessible via walker Does the patient have any problems obtaining your medications?: No   Social/Family/Support Systems Anticipated Caregiver: Charlestine Night, mother and family/friends Anticipated Caregiver's Contact Information: [phone] Caregiver Availability: 24/7 Discharge Plan Discussed with Primary Caregiver: Yes Is Caregiver In Agreement with Plan?: Yes Does Caregiver/Family have Issues with Lodging/Transportation while Pt is in Rehab?: No   Goals Patient/Family Goal for Rehab: PT/OT supervision to mod I, SLP supervision Expected length of stay: 7-10 days Additional Information: pt with  intermittent agitation/combatitiveness throughout hospitalization; per therapy mother very hands on in therapy session and can be counter productive Pt/Family Agrees to Admission and willing to participate: Yes Program Orientation Provided & Reviewed with Pt/Caregiver Including Roles  & Responsibilities: Yes  Barriers to Discharge: Insurance for SNF coverage   Decrease burden of Care through IP rehab admission: NA   Possible need for SNF placement upon discharge: Not anticipated, pt with good support from mother.    Patient Condition: I have reviewed medical records from [facility], spoken with CSW, and patient and family member. I met with patient at the bedside and discussed via phone for  inpatient rehabilitation assessment.  Patient will benefit from ongoing PT, OT, and SLP, can actively participate in 3 hours of therapy a day 5 days of the week, and can make measurable gains during the admission.  Patient will also benefit from the coordinated team approach during an Inpatient Acute Rehabilitation admission.  The patient will receive intensive therapy as well as Rehabilitation physician, nursing, social worker, and care management interventions.  Due to bowel management, safety, disease management, medication administration, pain management, and patient education the patient requires 24 hour a day rehabilitation nursing.  The patient is currently min assist with mobility and basic ADLs.  Discharge setting and therapy post discharge at home with home health is anticipated.  Patient has agreed to participate in the Acute Inpatient Rehabilitation Program and will admit today.   Preadmission Screen Completed By:  Shann Medal, PT, DPT, 03/10/2022 12:59 PM ______________________________________________________________________   Discussed status with Dr. Dagoberto Ligas on 03/10/22  at 1:00 PM  and received approval for admission today.   Admission Coordinator:  Shann Medal, PT, DPT, time 1:00 PM  /Date 03/10/22     Assessment/Plan: Diagnosis: Does the need for close, 24 hr/day Medical supervision in concert with the patient's rehab needs make it unreasonable for this patient to be served in a less intensive setting? Yes Co-Morbidities requiring supervision/potential complications: AMS, seixures, agitation, EtOH abuse, polysubstance abuse Due to bladder management, bowel management, safety, skin/wound care, disease management, medication administration, pain management, and patient education, does the patient require 24 hr/day rehab nursing? Yes Does the patient require coordinated care of a physician, rehab nurse, PT, OT, and SLP to address physical and functional deficits in the context of the above medical diagnosis(es)? Yes Addressing deficits in the following areas: balance, endurance, locomotion, strength, transferring, bowel/bladder control, bathing, dressing, feeding, grooming, toileting, cognition, speech, language, and swallowing Can the patient actively participate in an intensive therapy program of at least 3 hrs of therapy 5 days a week? Yes The potential for patient to make measurable gains while on inpatient rehab is good and fair Anticipated functional outcomes upon discharge from inpatient rehab: supervision and min assist PT, supervision OT, supervision and min assist SLP Estimated rehab length of stay to reach the above functional goals is: 10-14 days Anticipated discharge destination: Home 10. Overall Rehab/Functional Prognosis: good and fair     MD Signature:

## 2022-03-11 NOTE — Discharge Instructions (Signed)
Inpatient Rehab Discharge Instructions  Timothy Hubbard Discharge date and time: No discharge date for patient encounter.   Activities/Precautions/ Functional Status: Activity: activity as tolerated Diet: regular diet Wound Care: Routine skin checks Functional status:  ___ No restrictions     ___ Walk up steps independently ___ 24/7 supervision/assistance   ___ Walk up steps with assistance ___ Intermittent supervision/assistance  ___ Bathe/dress independently ___ Walk with walker     __x_ Bathe/dress with assistance ___ Walk Independently    ___ Shower independently ___ Walk with assistance    ___ Shower with assistance ___ No alcohol     ___ Return to work/school ________  Special Instructions:  No driving smoking or alcohol  My questions have been answered and I understand these instructions. I will adhere to these goals and the provided educational materials after my discharge from the hospital.  Patient/Caregiver Signature _______________________________ Date __________  Clinician Signature _______________________________________ Date __________  Please bring this form and your medication list with you to all your follow-up doctor's appointments.

## 2022-03-11 NOTE — Progress Notes (Signed)
Inpatient Rehabilitation Care Coordinator Assessment and Plan Patient Details  Name: Timothy Hubbard MRN: [id number] Date of Birth: [birth year]  Today's Date: 03/11/2022  Hospital Problems: Principal Problem:   Encephalopathy acute Active Problems:   Acute encephalopathy  Past Medical History:  Past Medical History:  Diagnosis Date   Asthma    HTN (hypertension)    Past Surgical History: History reviewed. No pertinent surgical history. Social History:  reports that he has been smoking cigarettes. He started smoking about 14 years ago. He has a 4.00 pack-year smoking history. He has never used smokeless tobacco. He reports current alcohol use of about 2.0 standard drinks of alcohol per week. He reports current drug use. Frequency: 7.00 times per week. Drug: Marijuana.  Family / Support Systems Marital Status: Single Patient Roles: Parent, Other (Comment) (employee and son) Children: has 10 yo daughter Other Supports: Timothy Hubbard-mom [phone]  multiple fmaily members Anticipated Caregiver: Mom and other family members-along with friends Ability/Limitations of Caregiver: Between all can almost provide 24/7 supervision Caregiver Availability: Other (Comment) (almost 24/7 supervision) Family Dynamics: Close with family and friends all pull together when one is in need and will be there for pt and he is grateful for this  Social History Preferred language: English Religion: None Cultural Background: no issues Education: HS Health Literacy - How often do you need to have someone help you when you read instructions, pamphlets, or other written material from your doctor or pharmacy?: Sometimes Writes: Yes Employment Status: Disabled Date Retired/Disabled/Unemployed: still worked some at [employer] Issues: No issues Guardian/Conservator: Mom is his guardian and will look to her for any decisions while here.   Abuse/Neglect Abuse/Neglect Assessment Can Be  Completed: Yes Physical Abuse: Denies Verbal Abuse: Denies Sexual Abuse: Denies Exploitation of patient/patient's resources: Denies Self-Neglect: Denies  Patient response to: Social Isolation - How often do you feel lonely or isolated from those around you?: Never  Emotional Status Pt's affect, behavior and adjustment status: Pt is motivated to do well and wants to recover, he is positive and willing to do what the therapy team asks of him. He was independent prior to admission and wants to get back to this level. Recent Psychosocial Issues: other health issues Psychiatric History: History of bipolar and schizophrenia takes medications for this. Not clear if see's a psychiatrist as an OP. Substance Abuse History: History of ETOH has cut back recently, positive cocaine and THC upon admission. Pt denies cocaine and reports was barely doing anything prior to admission  Patient / Family Perceptions, Expectations & Goals Pt/Family understanding of illness & functional limitations: Pt can explain he had a seizure and was unaware of having them he talks with the MD and feels he understandst the plan from here. Awaiting return call from Mom to clearify information Premorbid pt/family roles/activities: son, father, nephew, cousin, employee, etc Anticipated changes in roles/activities/participation: resume Pt/family expectations/goals: Pt states: " I want to do for myself when I leave here."  [employer]: None Premorbid Home Care/DME Agencies: None Transportation available at discharge: pt said he drove will not be able to now due to seizure-will rely upon family members Is the patient able to respond to transportation needs?: Yes In the past 12 months, has lack of transportation kept you from medical appointments or from getting medications?: No In the past 12 months, has lack of transportation kept you from meetings, work, or from getting things needed for daily  living?: No  Discharge Planning Living Arrangements: Parent, Other  relatives Support Systems: Children, Parent, Other relatives, Friends/neighbors Type of Residence: Private residence Insurance Resources: [employer] (specify county) Pensions consultant: SSI, Family Support Financial Screen Referred: No Living Expenses: Lives with family Money Management: Family Does the patient have any problems obtaining your medications?: No Home Management: family Patient/Family Preliminary Plans: Return home with Mom and other family members to assist him. Pt hopes to be mod/i by discharge and will work on achieving this. Care Coordinator Barriers to Discharge: Insurance for SNF coverage Care Coordinator Anticipated Follow Up Needs: HH/OP  Clinical Impression Pleasant gentleman who is motivated to improve and get back to his independent level. His Mom is his guardian and am awaiting return call from her. Pt seems to be quite childlike and eager to please others. Will work on discharge needs.   Timothy Hubbard 03/11/2022, 10:43 AM

## 2022-03-11 NOTE — Plan of Care (Signed)
Problem: RH Balance Goal: LTG: Patient will maintain dynamic sitting balance (OT) Description: LTG:  Patient will maintain dynamic sitting balance with assistance during activities of daily living (OT) Flowsheets (Taken 03/11/2022 1518) LTG: Pt will maintain dynamic sitting balance during ADLs with: Independent Goal: LTG Patient will maintain dynamic standing with ADLs (OT) Description: LTG:  Patient will maintain dynamic standing balance with assist during activities of daily living (OT)  Flowsheets (Taken 03/11/2022 1518) LTG: Pt will maintain dynamic standing balance during ADLs with: Supervision/Verbal cueing   Problem: Sit to Stand Goal: LTG:  Patient will perform sit to stand in prep for activites of daily living with assistance level (OT) Description: LTG:  Patient will perform sit to stand in prep for activites of daily living with assistance level (OT) Flowsheets (Taken 03/11/2022 1518) LTG: PT will perform sit to stand in prep for activites of daily living with assistance level: Supervision/Verbal cueing   Problem: RH Grooming Goal: LTG Patient will perform grooming w/assist,cues/equip (OT) Description: LTG: Patient will perform grooming with assist, with/without cues using equipment (OT) Flowsheets (Taken 03/11/2022 1518) LTG: Pt will perform grooming with assistance level of: Supervision/Verbal cueing   Problem: RH Bathing Goal: LTG Patient will bathe all body parts with assist levels (OT) Description: LTG: Patient will bathe all body parts with assist levels (OT) Flowsheets (Taken 03/11/2022 1518) LTG: Pt will perform bathing with assistance level/cueing: Supervision/Verbal cueing LTG: Position pt will perform bathing: Shower   Problem: RH Dressing Goal: LTG Patient will perform upper body dressing (OT) Description: LTG Patient will perform upper body dressing with assist, with/without cues (OT). Flowsheets (Taken 03/11/2022 1518) LTG: Pt will perform upper body dressing with  assistance level of: Supervision/Verbal cueing Goal: LTG Patient will perform lower body dressing w/assist (OT) Description: LTG: Patient will perform lower body dressing with assist, with/without cues in positioning using equipment (OT) Flowsheets (Taken 03/11/2022 1518) LTG: Pt will perform lower body dressing with assistance level of: Supervision/Verbal cueing   Problem: RH Toileting Goal: LTG Patient will perform toileting task (3/3 steps) with assistance level (OT) Description: LTG: Patient will perform toileting task (3/3 steps) with assistance level (OT)  Flowsheets (Taken 03/11/2022 1518) LTG: Pt will perform toileting task (3/3 steps) with assistance level: Supervision/Verbal cueing   Problem: RH Simple Meal Prep Goal: LTG Patient will perform simple meal prep w/assist (OT) Description: LTG: Patient will perform simple meal prep with assistance, with/without cues (OT). Flowsheets (Taken 03/11/2022 1518) LTG: Pt will perform simple meal prep with assistance level of: Supervision/Verbal cueing LTG: Pt will perform simple meal prep w/level of: Ambulate without device   Problem: RH Light Housekeeping Goal: LTG Patient will perform light housekeeping w/assist (OT) Description: LTG: Patient will perform light housekeeping with assistance, with/without cues (OT). Flowsheets (Taken 03/11/2022 1518) LTG: Pt will perform light housekeeping with assistance level of: Supervision/Verbal cueing LTG: Pt will perform light housekeeping w/level of: Ambulate without device   Problem: RH Toilet Transfers Goal: LTG Patient will perform toilet transfers w/assist (OT) Description: LTG: Patient will perform toilet transfers with assist, with/without cues using equipment (OT) Flowsheets (Taken 03/11/2022 1518) LTG: Pt will perform toilet transfers with assistance level of: Supervision/Verbal cueing   Problem: RH Tub/Shower Transfers Goal: LTG Patient will perform tub/shower transfers w/assist  (OT) Description: LTG: Patient will perform tub/shower transfers with assist, with/without cues using equipment (OT) Flowsheets (Taken 03/11/2022 1518) LTG: Pt will perform tub/shower stall transfers with assistance level of: Supervision/Verbal cueing LTG: Pt will perform tub/shower transfers from: Tub/shower combination  Problem: RH Pre-functional/Other (Specify) Goal: RH LTG OT (Specify) 2 Description: RH LTG OT (Specify) 2  Flowsheets (Taken 03/11/2022 1521) LTG: Other OT (Specify) 2: Pt will demonstrate improved safety awareness during functional tasks with occasional verbal cues.

## 2022-03-11 NOTE — Plan of Care (Signed)
  Problem: RH Expression Communication Goal: LTG Patient will increase speech intelligibility (SLP) Description: LTG: Patient will increase speech intelligibility at word/phrase/conversation level with cues, % of the time (SLP) Flowsheets (Taken 03/11/2022 1652) LTG: Patient will increase speech intelligibility (SLP): Supervision   Problem: RH Problem Solving Goal: LTG Patient will demonstrate problem solving for (SLP) Description: LTG:  Patient will demonstrate problem solving for basic/complex daily situations with cues  (SLP) Flowsheets (Taken 03/11/2022 1652) LTG: Patient will demonstrate problem solving for (SLP): Complex daily situations LTG Patient will demonstrate problem solving for: Supervision   Problem: RH Awareness Goal: LTG: Patient will demonstrate awareness during functional activites type of (SLP) Description: LTG: Patient will demonstrate awareness during functional activites type of (SLP) Flowsheets (Taken 03/11/2022 1652) Patient will demonstrate during cognitive/linguistic activities awareness type of: Emergent LTG: Patient will demonstrate awareness during cognitive/linguistic activities with assistance of (SLP): Supervision

## 2022-03-11 NOTE — Evaluation (Signed)
Physical Therapy Assessment and Plan  Patient Details  Name: AMIEL MCCAFFREY MRN: [id number] Date of Birth: 1990-10-27  PT Diagnosis: Cognitive deficits, Difficulty walking, and Impaired cognition Rehab Potential: Fair ELOS: 10-14 days   Today's Date: 03/11/2022 PT Individual Time: 0800-0900 PT Individual Time Calculation (min): 60 min    Hospital Problem: Principal Problem:   Encephalopathy acute Active Problems:   Acute encephalopathy   Past Medical History:  Past Medical History:  Diagnosis Date   Asthma    HTN (hypertension)    Past Surgical History: History reviewed. No pertinent surgical history.  Assessment & Plan Clinical Impression: Wolfgang Finigan. Bolen is a 31 year old right-handed male with history of hypertension, asthma, alcohol/tobacco use on no prescription medications.  Per chart review patient lives with parent.  1 level home.  Independent/employed prior to admission.  Presented 03/01/2022 with reported multiple seizures and altered mental status.  I report patient had normal health then coming out of the bathroom stumbling and fell.  No head trauma.  CT/MRI showed no acute intracranial pathology.  Mild global parenchymal volume loss accelerated for age.  CT angiogram head and neck negative.  Admission chemistries lactic acid 7.5-9.0, creatinine 1.29, troponin negative, blood cultures no growth to date, WBC 12,800, urine drug screen positive cocaine benzos as well as marijuana.  Initial EEG moderate to severe diffuse encephalopathy no seizure and repeated with 24-hour EEG again negative for seizure.  Lumbar puncture completed with glucose 76, total protein past 6.  CSF culture no organisms seen.  VDRL nonreactive.  Initially he was loaded with Keppra as well as Depakote and has since been weaned to Depakote alone.  Seizure felt to be provoked secondary to polypharmacy/alcohol cessation.  Psychiatry consulted 03/08/2022 for ongoing confusion and has been placed on Zyprexa.  Hospital  course bouts of hypokalemia as well as hypomagnesemia with supplement added.  Iron deficiency anemia 6.3 and he did receive 2 units packed red blood cells 03/05/2022.  Occult blood pending and latest hemoglobin 9.0.  He is tolerating a regular diet.  Therapy evaluations completed due to patient decreased functional mobility encephalopathy was admitted for a comprehensive rehab program.   Pt reports has condom catheter-  LBM this AM; little sore R side, but otherwise, no pain.  Peeing OK, but pt said is tea colored- nothing in bag, so not clear. Patient transferred to CIR on 03/10/2022 .   Patient currently requires min with mobility secondary to decreased attention, decreased problem solving, decreased safety awareness, and delayed processing and decreased standing balance and decreased balance strategies.  Prior to hospitalization, patient was independent  with mobility and lived with Family (mother who is a Marine scientist) in a House home.  Home access is 5Stairs to enter.  Patient will benefit from skilled PT intervention to maximize safe functional mobility, minimize fall risk, and decrease caregiver burden for planned discharge home with intermittent assist.  Anticipate patient will benefit from follow up OP at discharge.  PT - End of Session Activity Tolerance: Tolerates 30+ min activity without fatigue Endurance Deficit: No PT Assessment Rehab Potential (ACUTE/IP ONLY): Fair PT Barriers to Discharge: [place] home environment;Decreased caregiver support;Home environment access/layout;Behavior PT Patient demonstrates impairments in the following area(s): Balance;Behavior;Safety;Motor PT Transfers Functional Problem(s): Bed Mobility;Bed to Chair;Car PT Locomotion Functional Problem(s): Ambulation;Stairs PT Plan PT Intensity: Minimum of 1-2 x/day ,45 to 90 minutes PT Frequency: 5 out of 7 days PT Duration Estimated Length of Stay: 10-14 days PT Treatment/Interventions: Ambulation/gait  training;Cognitive remediation/compensation;Discharge planning;DME/adaptive equipment instruction;Functional mobility  training;Pain management;Psychosocial support;Splinting/orthotics;Therapeutic Activities;UE/LE Strength taining/ROM;Visual/perceptual remediation/compensation;Wheelchair propulsion/positioning;UE/LE Coordination activities;Therapeutic Exercise;Stair training;Skin care/wound management;Patient/family education;Neuromuscular re-education;Functional electrical stimulation;Disease management/prevention;Community reintegration;Balance/vestibular training PT Transfers Anticipated Outcome(s): supervision d/t cognition PT Locomotion Anticipated Outcome(s): supervision d/t cognition PT Recommendation Recommendations for Other Services: Speech consult Follow Up Recommendations: Outpatient PT Patient destination: Home Equipment Recommended: To be determined   PT Evaluation Precautions/Restrictions Precautions Precautions: Fall;Other (comment) Precaution Comments: seizure Restrictions Weight Bearing Restrictions: No General   Vital Signs Pain   Pain Interference   Home Living/Prior Functioning Home Living Living Arrangements: Parent;Other relatives Available Help at Discharge: Family;Available 24 hours/day (mom, dad, and cousin planning to provide support) Type of Home: House Home Access: Stairs to enter [employer] of Steps: 5 Entrance Stairs-Rails: Can reach both Home Layout: One level Bathroom Shower/Tub: Government social research officer Accessibility: Yes  Lives With: Family (mother who is a Marine scientist) Prior Function Level of Independence: Independent with gait;Independent with basic ADLs;Independent with homemaking with ambulation;Independent with transfers  Able to Take Stairs?: Yes Driving: Yes Vocation: Full time employment 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[employer], night shift Vision/Perception  Vision - History Ability to See in  Adequate Light: 0 Adequate Praxis Praxis: Intact  Cognition Overall Cognitive Status: Impaired/Different from baseline Attention: Focused;Sustained Focused Attention: Appears intact Sustained Attention: Appears intact Memory: Impaired Awareness: Impaired Problem Solving: Impaired Safety/Judgment: Impaired Sensation Sensation Light Touch: Appears Intact Hot/Cold: Appears Intact Proprioception: Appears Intact Stereognosis: Not tested Coordination Gross Motor Movements are Fluid and Coordinated: No Fine Motor Movements are Fluid and Coordinated: No Motor  Motor Motor: Within Functional Limits Motor - Skilled Clinical Observations: mildly uncoordinated at times   Trunk/Postural Assessment  Cervical Assessment Cervical Assessment: Within Functional Limits Thoracic Assessment Thoracic Assessment: Within Functional Limits Lumbar Assessment Lumbar Assessment: Within Functional Limits Postural Control Postural Control: Within Functional Limits  Balance Balance Balance Assessed: Yes Static Sitting Balance Static Sitting - Balance Support: Feet supported Static Sitting - Level of Assistance: 5: Stand by assistance Dynamic Sitting Balance Dynamic Sitting - Balance Support: Feet supported Dynamic Sitting - Level of Assistance: 5: Stand by assistance Static Standing Balance Static Standing - Balance Support: During functional activity Static Standing - Level of Assistance: 4: Min assist Dynamic Standing Balance Dynamic Standing - Balance Support: During functional activity Dynamic Standing - Level of Assistance: 4: Min assist Extremity Assessment      RLE Assessment RLE Assessment: Within Functional Limits General Strength Comments: Grossly 4+/5 LLE Assessment LLE Assessment: Within Functional Limits General Strength Comments: Grossly 4+/5  Care Tool Care Tool Bed Mobility Roll left and right activity   Roll left and right assist level: Supervision/Verbal cueing     Sit to lying activity   Sit to lying assist level: Supervision/Verbal cueing    Lying to sitting on side of bed activity   Lying to sitting on side of bed assist level: the ability to move from lying on the back to sitting on the side of the bed with no back support.: Supervision/Verbal cueing     Care Tool Transfers Sit to stand transfer   Sit to stand assist level: Contact Guard/Touching assist    Chair/bed transfer   Chair/bed transfer assist level: Minimal Assistance - Patient > 75%     Physiological scientist transfer assist level: Minimal Assistance - Patient > 75%      Care Tool Locomotion Ambulation   Assist level: Minimal Assistance - Patient > 75% Assistive device: No  Device Max distance: 120  Walk 10 feet activity   Assist level: Minimal Assistance - Patient > 75% Assistive device: No Device   Walk 50 feet with 2 turns activity   Assist level: Minimal Assistance - Patient > 75% Assistive device: No Device  Walk 150 feet activity Walk 150 feet activity did not occur: Safety/medical concerns      Walk 10 feet on uneven surfaces activity Walk 10 feet on uneven surfaces activity did not occur: Safety/medical concerns      Stairs Stair activity did not occur: Safety/medical concerns        Walk up/down 1 step activity Walk up/down 1 step or curb (drop down) activity did not occur: Safety/medical concerns      Walk up/down 4 steps activity Walk up/down 4 steps activity did not occur: Safety/medical concerns      Walk up/down 12 steps activity Walk up/down 12 steps activity did not occur: Safety/medical concerns      Pick up small objects from floor Pick up small object from the floor (from standing position) activity did not occur: Safety/medical concerns      Wheelchair Is the patient using a wheelchair?: No (pt ambulating during session)          Wheel 50 feet with 2 turns activity Wheelchair 50 feet with 2 turns activity did not  occur: N/A    Wheel 150 feet activity Wheelchair 150 feet activity did not occur: N/A      Refer to Care Plan for Long Term Goals  SHORT TERM GOAL WEEK 1 PT Short Term Goal 1 (Week 1): pt will ambulate x 150 ft with CGA or better PT Short Term Goal 2 (Week 1): Pt will initiate stair training PT Short Term Goal 3 (Week 1): Pt will ambulate in distracting environment without LOB  Recommendations for other services: None   Skilled Therapeutic Intervention Evaluation completed (see details above) with patient education regarding purpose of PT evaluation, PT POC and goals, therapy schedule, weekly team meetings, and other CIR information including safety plan and fall risk safety. Pt reports having no pain except from needle sticks. Motor and sensory testing performed in sitting. MD in/out for consult and RN in/out with meds pass. While taking meds, RN and therapist both instructed pt to take one at a time. RN also suggested applesauce, which pt declined. Instead, pt attempted to take pills all at once and then filled his mouth with liquid. Pt was unable to swallow d/t filling mouth and spit into cup. No signs or symptoms of aspiration noted at this time. Once pt had recovered, he was agreeable to mobility. Pt ambulated x 120 ft with min A and no AD, min LOB x 3. Noted incr LOB when in distracting hallway vs quiet gym. Car transfer with min A. Pt performed the below functional mobility tasks with the specified levels of skilled cuing and assistance.  Pt returned to bed after session and was left with all needs in reach and alarm active.   Mobility Transfers Transfers: Sit to Stand;Stand to Sit;Stand Pivot Transfers Sit to Stand: Contact Guard/Touching assist Stand to Sit: Contact Guard/Touching assist Stand Pivot Transfers: Minimal Assistance - Patient > 75% Transfer (Assistive device): None Locomotion  Gait Ambulation: Yes Gait Assistance: Minimal Assistance - Patient > 75% Gait Distance  (Feet): 120 Feet Assistive device: None Gait Assistance Details: Verbal cues for precautions/safety Gait Assistance Details: verbal cues to focus on environment Gait Gait: Yes Gait Pattern: Within Functional Limits Gait  velocity: decreased Stairs / Additional Locomotion Stairs: No Wheelchair Mobility Wheelchair Mobility: No   Discharge Criteria: Patient will be discharged from PT if patient refuses treatment 3 consecutive times without medical reason, if treatment goals not met, if there is a change in medical status, if patient makes no progress towards goals or if patient is discharged from hospital.  The above assessment, treatment plan, treatment alternatives and goals were discussed and mutually agreed upon: No family available/patient unable  [name] 03/11/2022, 12:34 PM

## 2022-03-11 NOTE — Progress Notes (Signed)
Inpatient Rehabilitation Admission Medication Review by a Pharmacist  A complete drug regimen review was completed for this patient to identify any potential clinically significant medication issues.  High Risk Drug Classes Is patient taking? Indication by Medication  Antipsychotic Yes Olanzapine - AMS   Anticoagulant No   Antibiotic No   Opioid No   Antiplatelet No   Hypoglycemics/insulin No   Vasoactive Medication No Amlodipine - HTN  Chemotherapy No   Other Yes Acetaminophen - PRN pain  Albuterol - PRN SOB  Divalproex - seizure ppx  Docusate - PRN constipation  Folic acid, multivitamin, thiamine - supplement  Magnesium oxide - supplementation  Dulera - asthma  Nicotine patch- smoking cessation Pantoprazole - GERD       Type of Medication Issue Identified Description of Issue Recommendation(s)  Drug Interaction(s) (clinically significant)     Duplicate Therapy     Allergy     No Medication Administration End Date     Incorrect Dose     Additional Drug Therapy Needed     Significant med changes from prior encounter (inform family/care partners about these prior to discharge).    Other       Clinically significant medication issues were identified that warrant physician communication and completion of prescribed/recommended actions by midnight of the next day:  No   Pharmacist comments:   Time spent performing this drug regimen review (minutes):  [place], PharmD PGY1 Pharmacy Resident

## 2022-03-11 NOTE — Progress Notes (Signed)
Inpatient Rehabilitation  Patient information reviewed and entered into eRehab system by Lindsy Cerullo M. Emry Tobin, M.A., CCC/SLP, PPS Coordinator.  Information including medical coding, functional ability and quality indicators will be reviewed and updated through discharge.    

## 2022-03-11 NOTE — Evaluation (Signed)
Speech Language Pathology Assessment and Plan  Patient Details  Name: Timothy Hubbard MRN: [id number] Date of Birth: 08-16-1991  SLP Diagnosis: Dysarthria;Cognitive Impairments  Rehab Potential: Good ELOS: 10-14 days   Today's Date: 03/11/2022 SLP Individual Time: 1000-1100 SLP Individual Time Calculation (min): 60 min  Hospital Problem: Principal Problem:   Encephalopathy acute Active Problems:   Acute encephalopathy  Past Medical History:  Past Medical History:  Diagnosis Date   Asthma    HTN (hypertension)    Past Surgical History: History reviewed. No pertinent surgical history.  Assessment / Plan / Recommendation Clinical Impression Maveryk Renstrom. Caamano is a 31 year old right-handed male with history of hypertension, asthma, alcohol/tobacco use on no prescription medications.  Per chart review patient lives with parent.  1 level home.  Independent/employed prior to admission.  Presented 03/01/2022 with reported multiple seizures and altered mental status.  I report patient had normal health then coming out of the bathroom stumbling and fell.  No head trauma.  CT/MRI showed no acute intracranial pathology.  Mild global parenchymal volume loss accelerated for age.  CT angiogram head and neck negative.  Admission chemistries lactic acid 7.5-9.0, creatinine 1.29, troponin negative, blood cultures no growth to date, WBC 12,800, urine drug screen positive cocaine benzos as well as marijuana.  Initial EEG moderate to severe diffuse encephalopathy no seizure and repeated with 24-hour EEG again negative for seizure. Seizure felt to be provoked secondary to polypharmacy/alcohol cessation.  Psychiatry consulted 03/08/2022 for ongoing confusion and has been placed on Zyprexa. He is tolerating a regular diet.  Therapy evaluations completed due to patient decreased functional mobility encephalopathy was admitted for a comprehensive rehab program.  Pt seen for bedside swallow evaluation secondary to report of  difficulty taking morning meds per nurse. Oral mechanism exam was unremarkable. Oropharyngeal swallow function appears [facility] for consumption of thin liquids by cup, puree, mechanical soft, and regular solid textures. Pt consumed all PO trials with functional oral preparation, oral clearance, and without overt s/sx of aspiration. Pt's mother (who is a Marine scientist) called on phone and reported pt has "never swallowed pills well" and she crushed them at home at prior level.  Pt verbalized preference to have all medications crushed. Pt was observed consuming crushed meds in applesauce during session without difficulty. Communicated preferences to pt's nurse and updated communication/signs in room. Recommend regular diet with thin liquids and crushed meds. No additional swallowing needs identified at this time.   Pt presented with a moderate dysarthria impacting articulatory precision and decreased speech intelligibility at the phrase/short sentence level. Pt did not appear aware of errors, though was receptive to cues to reduce speech rate which improved comprehensibility.   Baseline cognition unknown. Unable to formally assess cognition due to time constraints. Subjectively, pt presented with decreased sustained attention, short-term recall, intellectual awareness of deficits, and verbal reasoning. Anticipate problem solving deficits. Pt was known to perseverate on certain topics and was very particular with items on table which caused somewhat of a distraction throughout. Recommend additional cognitive-linguistic evaluation.   Skilled SLP intervention is recommended to address speech and cognitive-linguistic deficits to increase functional independence prior to discharge.    Skilled Therapeutic Interventions          Speech/language/cognitive evaluations. Bedside swallow assessment. Please see above.  SLP Assessment  Patient will need skilled Speech Lanaguage Pathology Services during CIR admission     Recommendations  SLP Diet Recommendations: Thin;Age appropriate regular solids Liquid Administration via: Cup;Straw Medication Administration: Crushed with puree (per pt  preference) Supervision: Patient able to self feed Compensations: Slow rate Postural Changes and/or Swallow Maneuvers: Seated upright 90 degrees Oral Care Recommendations: Oral care BID Recommendations for Other Services: Neuropsych consult Patient destination: Home Follow up Recommendations: Outpatient SLP Equipment Recommended: None recommended by SLP    SLP Frequency 3 to 5 out of 7 days   SLP Duration  SLP Intensity  SLP Treatment/Interventions 10-14 days  Minumum of 1-2 x/day, 30 to 90 minutes  Cognitive remediation/compensation;Internal/external aids;Speech/Language facilitation;Functional tasks;Patient/family education;Therapeutic Activities    Pain    Prior Functioning Cognitive/Linguistic Baseline: Information not available Type of Home: House  Lives With: Family Available Help at Discharge: Family;Available 24 hours/day Vocation: Full time employment  SLP Evaluation Cognition Overall Cognitive Status: Impaired/Different from baseline Arousal/Alertness: Awake/alert Orientation Level: Oriented X4 Year: 2023 Month: July Day of Week: Correct Attention: Focused;Sustained;Selective Focused Attention: Appears intact Sustained Attention: Impaired Sustained Attention Impairment: Verbal basic Selective Attention: Impaired Selective Attention Impairment: Verbal basic Memory: Impaired Memory Impairment: Decreased recall of new information Awareness: Impaired Awareness Impairment: Intellectual impairment Problem Solving: Impaired Problem Solving Impairment: Verbal basic Executive Function: Self Monitoring;Self Correcting;Reasoning Reasoning: Impaired Reasoning Impairment: Verbal basic Self Monitoring: Impaired Self Monitoring Impairment: Verbal basic Self Correcting: Impaired Self Correcting  Impairment: Verbal basic Behaviors: Perseveration Safety/Judgment: Impaired  Comprehension Auditory Comprehension Overall Auditory Comprehension: Appears within functional limits for tasks assessed Expression Expression Primary Mode of Expression: Verbal Verbal Expression Overall Verbal Expression: Appears within functional limits for tasks assessed Written Expression Dominant Hand: Right Oral Motor Oral Motor/Sensory Function Overall Oral Motor/Sensory Function: Within functional limits Motor Speech Overall Motor Speech: Impaired Respiration: Within functional limits Phonation: Low vocal intensity Resonance: Within functional limits Articulation: Impaired Level of Impairment: Word Intelligibility: Intelligibility reduced Word: 75-100% accurate Phrase: 75-100% accurate Sentence: 50-74% accurate Motor Planning: Witnin functional limits Motor Speech Errors: Unaware;Inconsistent Effective Techniques: Slow rate;Pause  Care Tool Care Tool Cognition Ability to hear (with hearing aid or hearing appliances if normally used Ability to hear (with hearing aid or hearing appliances if normally used): 0. Adequate - no difficulty in normal conservation, social interaction, listening to TV   Expression of Ideas and Wants Expression of Ideas and Wants: 3. Some difficulty - exhibits some difficulty with expressing needs and ideas (e.g, some words or finishing thoughts) or speech is not clear   Understanding Verbal and Non-Verbal Content Understanding Verbal and Non-Verbal Content: 3. Usually understands - understands most conversations, but misses some part/intent of message. Requires cues at times to understand  Memory/Recall Ability Memory/Recall Ability : Current season;That he or she is in a hospital/hospital unit   PMSV Assessment  PMSV Trial Intelligibility: Intelligibility reduced Word: 75-100% accurate Phrase: 75-100% accurate Sentence: 50-74% accurate  Bedside Swallowing  Assessment General Date of Onset: 03/01/22 Previous Swallow Assessment: 6/29 BSE Diet Prior to this Study: Regular;Thin liquids Temperature Spikes Noted: No Respiratory Status: Room air History of Recent Intubation: No Behavior/Cognition: Alert;Cooperative;Distractible Oral Cavity - Dentition: Adequate natural dentition Self-Feeding Abilities: Able to feed self Patient Positioning: Upright in bed Baseline Vocal Quality: Normal Volitional Cough: Strong Volitional Swallow: Able to elicit  Oral Care Assessment Oral Assessment  (WDL): Within Defined Limits Lips: Symmetrical Teeth: Poor dental hygiene Tongue: Pink Mucous Membrane(s): Moist Saliva: Moist, saliva free flowing Level of Consciousness: Alert Is patient on any of following O2 devices?: None of the above Nutritional status: No high risk factors Oral Assessment Risk : Low Risk Ice Chips Ice chips: Not tested Thin Liquid Thin Liquid: Within functional limits Nectar Thick  Nectar Thick Liquid: Not tested Honey Thick Honey Thick Liquid: Not tested Puree Puree: Within functional limits Solid Solid: Within functional limits BSE Assessment Risk for Aspiration Impact on safety and function: Mild aspiration risk Other Related Risk Factors: Cognitive impairment  Short Term Goals: Week 1: SLP Short Term Goal 1 (Week 1): Patient will participate in further cognitive-linguistic evaluation SLP Short Term Goal 2 (Week 1): Patient will implement speech intelligibility strategies at the sentence level with min A verbal cues to achieve 80-90% intelligibility SLP Short Term Goal 3 (Week 1): Patient will monitor and self correct speech errors during 75% of opportunities with min A verbal cues SLP Short Term Goal 4 (Week 1): Pt will demonstrate awareness of deficits by anticipating various needs at home based on current limitations with min A verbal cues SLP Short Term Goal 5 (Week 1): Patient will complete mildly complex problem  solving tasks with min A verbal cues  Refer to Care Plan for Long Term Goals  Recommendations for other services: Neuropsych  Discharge Criteria: Patient will be discharged from SLP if patient refuses treatment 3 consecutive times without medical reason, if treatment goals not met, if there is a change in medical status, if patient makes no progress towards goals or if patient is discharged from hospital.  The above assessment, treatment plan, treatment alternatives and goals were discussed and mutually agreed upon: by patient and by family  Patty Sermons 03/11/2022, 4:53 PM

## 2022-03-11 NOTE — Progress Notes (Signed)
Patient ID: Timothy Hubbard, male   DOB: 01-16-1991, 31 y.o.   MRN: [id number] Met with the patient to review current situation, review rehab process, team conference and plan of care. Discussed issues swallowing medications (large pills need to be crushed in apple sauce). Patient is impulsive, impatient and easily distracted by organizing space around him and cleaning off bedside table and putting away items in a bag. Discussed secondary issues including  anemia, HTN along with recommendations for substance use cessation, smoking cessation and ETOH intake. Patient requested nicotine patch for smoking cessation. Patient asking about dietary modifications to help with iron levels and vitamins; noted he was a "picky" eater and did not like a lot of food items. Patient given recommendations for folic acid, iron, magnesium and calcium. Denied problems with constipation; difficulty in having a BM today but needed extra time to focus on the task; hemorrhoids reported and PA made aware of need for medication for hemorrhoids.  Continue to follow along to discharge to address educational needs to facilitate preparation for discharge home with mother. Margarito Liner

## 2022-03-11 NOTE — Progress Notes (Signed)
Inpatient [facility] Individual Statement of Services  Patient Name:  Timothy Hubbard  Date:  03/11/2022  Welcome to the [facility].  Our goal is to provide you with an individualized program based on your diagnosis and situation, designed to meet your specific needs.  With this comprehensive rehabilitation program, you will be expected to participate in at least 3 hours of rehabilitation therapies Monday-Friday, with modified therapy programming on the weekends.  Your rehabilitation program will include the following services:  Physical Therapy (PT), Occupational Therapy (OT), Speech Therapy (ST), 24 hour per day rehabilitation nursing, Therapeutic Recreaction (TR), Neuropsychology, Care Coordinator, Rehabilitation Medicine, Nutrition Services, and Pharmacy Services  Weekly team conferences will be held on Wednesday to discuss your progress.  Your Inpatient Rehabilitation Care Coordinator will talk with you frequently to get your input and to update you on team discussions.  Team conferences with you and your family in attendance may also be held.  Expected length of stay: 10-14 days  Overall anticipated outcome: supervision level  Depending on your progress and recovery, your program may change. Your Inpatient Rehabilitation Care Coordinator will coordinate services and will keep you informed of any changes. Your Inpatient Rehabilitation Care Coordinator's name and contact numbers are listed  below.  The following services may also be recommended but are not provided by the [facility] will be made to provide these services after discharge if needed.  Arrangements include referral to agencies that provide these services.  Your insurance has been verified to be:  [employer]-Medicaid Your primary doctor is:  Neurosurgeon  Pertinent  information will be shared with your doctor and your insurance company.  Inpatient Rehabilitation Care Coordinator:  Ovidio Kin, Thomas or Emilia Beck  Information discussed with and copy given to patient by: Elease Hashimoto, 03/11/2022, 10:45 AM

## 2022-03-12 DIAGNOSIS — G9340 Encephalopathy, unspecified: Secondary | ICD-10-CM | POA: Diagnosis not present

## 2022-03-12 MED ORDER — SODIUM CHLORIDE (PF) 0.9 % IJ SOLN
INTRAMUSCULAR | Status: AC
  Administered 2022-03-12: 10 mL
  Filled 2022-03-12: qty 10

## 2022-03-12 MED ORDER — AMLODIPINE BESYLATE 10 MG PO TABS
10.0000 mg | ORAL_TABLET | Freq: Every day | ORAL | Status: DC
  Administered 2022-03-13 – 2022-03-19 (×7): 10 mg via ORAL
  Filled 2022-03-12 (×7): qty 1

## 2022-03-12 NOTE — Progress Notes (Signed)
Physical Therapy Session Note  Patient Details  Name: Timothy Hubbard MRN: [id number] Date of Birth: [birth year]  Today's Date: 03/12/2022 PT Individual Time: 6767-2094 PT Individual Time Calculation (min): 68 min   Short Term Goals: Week 1:  PT Short Term Goal 1 (Week 1): pt will ambulate x 150 ft with CGA or better PT Short Term Goal 2 (Week 1): Pt will initiate stair training PT Short Term Goal 3 (Week 1): Pt will ambulate in distracting environment without LOB Week 2:    Week 3:     Skilled Therapeutic Interventions/Progress Updates:   Patient demonstrates increased fall risk as noted by score of   44/56 on Berg Balance Scale.  (<36= high risk for falls, close to 100%; 37-45 significant >80%; 46-51 moderate >50%; 52-55 lower >25%)  PT instructed pt in DGI. See below for results. Demonstrates increased fall risk with score of 18/24. (<19 indicates increased fall risk)   PT instructed pt in TUG: 8.05 sec (average of 3 trials; >13.5 sec indicates increased fall risk)  Gait training through rehab unit x 282f to rehab gym with supervision A-CGA from PT and noted UR supported on trail in hall   Dynamic balance training   ADL in room to remove bedsheets and pillow case and replace with new, as well as clean trash off floor.       Therapy Documentation Precautions:  Precautions Precautions: Fall, Other (comment) Precaution Comments: seizure Restrictions Weight Bearing Restrictions: No General:   Vital Signs: Oxygen Therapy SpO2: 99 % O2 Device: Room Air Pain: Pain Assessment Pain Scale: 0-10 Pain Score: 0-No pain Mobility:   Locomotion :    Trunk/Postural Assessment :    Balance: Standardized Balance Assessment Standardized Balance Assessment: Berg Balance Test;Dynamic Gait Index;Timed Up and Go Test Berg Balance Test Sit to Stand: Able to stand without using hands and stabilize independently Standing Unsupported: Able to stand safely 2 minutes Sitting with Back  Unsupported but Feet Supported on Floor or Stool: Able to sit safely and securely 2 minutes Stand to Sit: Controls descent by using hands Transfers: Able to transfer safely, definite need of hands Standing Unsupported with Eyes Closed: Able to stand 10 seconds with supervision Standing Ubsupported with Feet Together: Able to place feet together independently and stand for 1 minute with supervision From Standing, Reach Forward with Outstretched Arm: Can reach forward >12 cm safely (5") From Standing Position, Pick up Object from Floor: Able to pick up shoe safely and easily From Standing Position, Turn to Look Behind Over each Shoulder: Looks behind from both sides and weight shifts well Turn 360 Degrees: Able to turn 360 degrees safely in 4 seconds or less Standing Unsupported, Alternately Place Feet on Step/Stool: Able to complete 4 steps without aid or supervision Standing Unsupported, One Foot in Front: Able to take small step independently and hold 30 seconds Standing on One Leg: Tries to lift leg/unable to hold 3 seconds but remains standing independently Total Score: 44 Dynamic Gait Index Level Surface: Normal Change in Gait Speed: Mild Impairment Gait with Horizontal Head Turns: Mild Impairment Gait with Vertical Head Turns: Mild Impairment Gait and Pivot Turn: Mild Impairment Step Over Obstacle: Mild Impairment Step Around Obstacles: Normal Steps: Mild Impairment Total Score: 18 Timed Up and Go Test TUG: Normal TUG Normal TUG (seconds): 8.05 (9.3, 6.8) Exercises:   Other Treatments:      Therapy/Group: Individual Therapy  ALorie Phenix7/04/2022, 11:18 AM

## 2022-03-12 NOTE — Progress Notes (Signed)
PROGRESS NOTE   Subjective/Complaints:  No issues overnite , pt feels a little week on left side also speech "needs toget better" He is aware of ELOS ROS Review of Systems  Respiratory:  Negative for shortness of breath.   Cardiovascular:  Negative for chest pain.  Gastrointestinal:  Negative for abdominal pain.  Neurological:  Negative for dizziness and headaches.      Objective:   No results found. Recent Labs    03/10/22 0703 03/11/22 0502  WBC 6.7 8.3  HGB 9.0* 8.7*  HCT 29.9* 29.3*  PLT 227 244    Recent Labs    03/10/22 0703 03/11/22 0502  NA 144 143  K 3.6 3.9  CL 117* 115*  CO2 22 21*  GLUCOSE 95 88  BUN 6 5*  CREATININE 0.56* 0.59*  CALCIUM 8.1* 8.2*     Intake/Output Summary (Last 24 hours) at 03/12/2022 1213 Last data filed at 03/12/2022 0950 Gross per 24 hour  Intake 566 ml  Output 400 ml  Net 166 ml         Physical Exam: Vital Signs Blood pressure 135/88, pulse (!) 101, temperature 98.1 F (36.7 C), temperature source Oral, resp. rate 18, height '5\' 4"'$  (1.626 m), weight 60 kg, SpO2 99 %.   General: No acute distress Mood and affect are appropriate Heart: Regular rate and rhythm no rubs murmurs or extra sounds Lungs: Clear to auscultation, breathing unlabored, no rales or wheezes Abdomen: Positive bowel sounds, soft nontender to palpation, nondistended Extremities: No clubbing, cyanosis, or edema Skin: No evidence of breakdown, no evidence of rash Neurologic: Cranial nerves II through XII intact, motor strength is 5/5 in bilateral deltoid, bicep, tricep, grip, hip flexor, knee extensors, ankle dorsiflexor and plantar flexor Sensory exam normal sensation to light touch and proprioception in bilateral upper and lower extremities Cerebellar exam normal finger to nose to finger as well as heel to shin in bilateral upper and lower extremities Musculoskeletal: Full range of motion in all  4 extremities. No joint swelling      Skin:    General: Skin is warm and dry.  Neurological:     Comments:  Makes eye contact with examiner.  He is a bit anxious but does participate with exam.  Provided his name.  Follows simple commands. Very dysarthric- hard to understand at times Intact to light touch in all 4 extremities  Psychiatric:     Comments: Flat affect   Assessment/Plan: 1. Functional deficits which require 3+ hours per day of interdisciplinary therapy in a comprehensive inpatient rehab setting. Physiatrist is providing close team supervision and 24 hour management of active medical problems listed below. Physiatrist and rehab team continue to assess barriers to discharge/monitor patient progress toward functional and medical goals  Care Tool:  Bathing    Body parts bathed by patient: Right arm, Left arm, Chest, Abdomen, Front perineal area, Buttocks, Right upper leg, Left upper leg, Right lower leg, Left lower leg, Face         Bathing assist Assist Level: Minimal Assistance - Patient > 75%     Upper Body Dressing/Undressing Upper body dressing   What is the patient wearing?: Pull over  shirt    Upper body assist Assist Level: Contact Guard/Touching assist    Lower Body Dressing/Undressing Lower body dressing      What is the patient wearing?: Pants, Underwear/pull up     Lower body assist Assist for lower body dressing: Minimal Assistance - Patient > 75%     Toileting Toileting    Toileting assist Assist for toileting: Minimal Assistance - Patient > 75%     Transfers Chair/bed transfer  Transfers assist     Chair/bed transfer assist level: Minimal Assistance - Patient > 75%     Locomotion Ambulation   Ambulation assist      Assist level: Minimal Assistance - Patient > 75% Assistive device: No Device Max distance: 120   Walk 10 feet activity   Assist     Assist level: Minimal Assistance - Patient > 75% Assistive device: No  Device   Walk 50 feet activity   Assist    Assist level: Minimal Assistance - Patient > 75% Assistive device: No Device    Walk 150 feet activity   Assist Walk 150 feet activity did not occur: Safety/medical concerns         Walk 10 feet on uneven surface  activity   Assist Walk 10 feet on uneven surfaces activity did not occur: Safety/medical concerns         Wheelchair     Assist Is the patient using a wheelchair?: No (pt ambulating during session)             Wheelchair 50 feet with 2 turns activity    Assist    Wheelchair 50 feet with 2 turns activity did not occur: N/A       Wheelchair 150 feet activity     Assist  Wheelchair 150 feet activity did not occur: N/A       Blood pressure 135/88, pulse (!) 101, temperature 98.1 F (36.7 C), temperature source Oral, resp. rate 18, height '5\' 4"'$  (1.626 m), weight 60 kg, SpO2 99 %.    Medical Problem List and Plan: 1. Functional deficits secondary to acute encephalopathy provoked secondary to polypharmacy/alcohol cessation.             -patient may  shower             -ELOS/Goals: 10-14 days- Supervision to min A  -PT/OT/SLP evaluations 2.  Antithrombotics: -DVT/anticoagulation:  Mechanical: Antiembolism stockings, thigh (TED hose) Bilateral lower extremities             -antiplatelet therapy: N/A 3. Pain Management: Tylenol as needed 4. Mood/Sleep: Provide emotional support             -antipsychotic agents: Zyprexa 5 mg nightly 5. Neuropsych/cognition: This patient is not capable of making decisions on his own behalf. 6. Skin/Wound Care: Routine skin checks 7. Fluids/Electrolytes/Nutrition: Routine INO's with follow-up chemistries 8.  Seizure prophylaxis.  Depakote 500 mg twice daily.  EEG negative 9.  Hypertension.  Norvasc 5 mg daily Vitals:   03/12/22 0507 03/12/22 0818  BP: 135/88   Pulse: (!) 101   Resp: 18   Temp: 98.1 F (36.7 C)   SpO2: 97% 99%    10.  Asthma.   Continue Dulera as well as nebulizer as needed.  Check oxygen saturations every shift 11.  History of alcohol tobacco abuse.  Counseling. 12.  Iron deficiency anemia.  Patient did receive 2 units packed red blood cell 03/05/2022.  Follow-up CBC stable at 8.7 on 03/11/22 13.  GERD.  Protonix 14/ Low albumin  -Monitor PO intake, discussed eating foods with protein    LOS: 2 days A FACE TO FACE EVALUATION WAS PERFORMED  Charlett Blake 03/12/2022, 12:13 PM

## 2022-03-12 NOTE — Progress Notes (Signed)
Speech Language Pathology Daily Session Note  Patient Details  Name: Timothy Hubbard MRN: [id number] Date of Birth: 05-30-1991  Today's Date: 03/12/2022 SLP Individual Time: 0730-0830 SLP Individual Time Calculation (min): 60 min  Short Term Goals: Week 1: SLP Short Term Goal 1 (Week 1): Patient will participate in further cognitive-linguistic evaluation SLP Short Term Goal 2 (Week 1): Patient will implement speech intelligibility strategies at the sentence level with min A verbal cues to achieve 80-90% intelligibility SLP Short Term Goal 3 (Week 1): Patient will monitor and self correct speech errors during 75% of opportunities with min A verbal cues SLP Short Term Goal 4 (Week 1): Pt will demonstrate awareness of deficits by anticipating various needs at home based on current limitations with min A verbal cues SLP Short Term Goal 5 (Week 1): Patient will complete mildly complex problem solving tasks with min A verbal cues  Skilled Therapeutic Interventions: Pt seen for skilled ST with focus on cognitive and speech goals, pt upright in recliner with AM meal present. Pt was able to recall he was hospitalized "for a seizure", however required min-mod A verbal cues to increase awareness of new deficits and changes in physical and cognitive function. Without intervention, speech intelligibility <50% at phrase level and patient requiring mod-max A verbal cues to utilize speech intelligibility strategies to increase to 70%. Pt mostly unaware of communication breakdowns, educated on "A BOSS" speech strategy sign hung in room and will need further education due to poor reading ability. Pt participating in SLUMS examination with a score of 10/30 (normal for <HS education 25+). Pt with significant impairments in attention and comprehension of prompts/directions which impacted score. Pt requesting to transfer to bed after session, benefits from min A cues for safety during ambulation. Pt reports walking in room  independently, educated on use of call button for all mobility/assistance. Pt left in bed with alarm set and all needs within reach. Cont ST POC.  Pain Pain Assessment Pain Scale: 0-10 Pain Score: 0-No pain  Therapy/Group: Individual Therapy  Dewaine Conger 03/12/2022, 8:31 AM

## 2022-03-12 NOTE — Progress Notes (Signed)
Occupational Therapy Session Note  Patient Details  Name: Timothy Hubbard MRN: [id number] Date of Birth: 06-02-1991  Today's Date: 03/12/2022 OT Individual Time: 1300-1415 OT Individual Time Calculation (min): 75 min    Short Term Goals: Week 1:  OT Short Term Goal 1 (Week 1): Pt to be CGA for functional transfers OT Short Term Goal 2 (Week 1): Pt to be CGA for LB bathing/dressiong OT Short Term Goal 3 (Week 1): Pt to demonstrate improved perception during self care with min verbal cues OT Short Term Goal 4 (Week 1): Pt to demonstrate improved safety awareness with mod verbal cues provided to reduce risk for falls OT Short Term Goal 5 (Week 1): Pt to be CGA for toileting  Skilled Therapeutic Interventions/Progress Updates:    Pt received in bed being attended to by nursing checking his BP as it was elevated.  Pt was not symptomatic and able to participate in therapy.   Spent time discussing with pt his visual issues. Pt stated his diplopia is gone and he does not have any problems, but during functional tasks and NMR exercises, he struggled with depth perception, overshooting, spatial relations.   Pt agreeable to getting up to sink to wash face and change shirts. Pt tends to move very quickly and often has interesting movement patterns (ie lifting one foot and using one hand to balance on cabinet while using other hand to wash face). When I mentioned this to pt he said "oh sorry, I dont know what I am doing!" And then corrected his positioning.  Pt needed frequent guarding A and cuing for the safest positioning with reaching outside his BOS.  He would often stand and instead of taking a few steps closer to the trash can he was using or the closet drawer, he would reach way over and have to balance with a supporting hand.  Cued pt safer techniques but he will likely need a great deal of reinforcement.  Pt moved around room quite a bit going from closet to hamper to bed to sink, having difficulty with  thought organization and focusing on task.    Pt often distracted by his phone checking to see if his cousin had contacted him.   To work on coordination and balance, did standing exercises in front of sink mirror moving B arms together in various planes. Pt often shrugs shoulders to compensate as he has poor sh strength.  Squats standing at sink to partially sit in chair behind him with max cues and tactile cues for proper technique.    Seated in recliner worked with 4# dowel bar for coordinated exercises. He did have mod difficulty with smooth movement patterns and sequencing, needing mod cues.   Discussed his work and the level of physical intensity. Pt wants to return to work soon but suggested he will probably need to wait several months until he is moving more safely.  Pt did need guarding assist very often during his standing and ambulation activities.   Pt resting in recliner with belt alarm on and all needs met.   Therapy Documentation Precautions:  Precautions Precautions: Fall, Other (comment) Precaution Comments: seizure Restrictions Weight Bearing Restrictions: No    Vital Signs: Therapy Vitals Temp: 98.1 F (36.7 C) Temp Source: Oral Pulse Rate: (!) 101 Resp: 18 BP: 135/88 Patient Position (if appropriate): Lying Oxygen Therapy SpO2: 97 % O2 Device: Room Air Pain:   ADL: ADL Eating: Independent Where Assessed-Eating: Bed level Grooming: Contact guard Where Assessed-Grooming: Standing at  sink Upper Body Bathing: Contact guard Where Assessed-Upper Body Bathing: Shower Lower Body Bathing: Contact guard Where Assessed-Lower Body Bathing: Shower Upper Body Dressing: Contact guard Where Assessed-Upper Body Dressing:  (shower) Lower Body Dressing: Minimal assistance (for balance) Where Assessed-Lower Body Dressing: Edge of bed (shower) Toileting: Minimal assistance (simulated) Where Assessed-Toileting: Glass blower/designer: Therapist, music  Method: Counselling psychologist: Energy manager: Environmental education officer Method: Heritage manager: Radio broadcast assistant   Therapy/Group: Individual Therapy  Rozanne Heumann 03/12/2022, 7:59 AM

## 2022-03-13 DIAGNOSIS — G9340 Encephalopathy, unspecified: Secondary | ICD-10-CM | POA: Diagnosis not present

## 2022-03-13 DIAGNOSIS — K625 Hemorrhage of anus and rectum: Secondary | ICD-10-CM

## 2022-03-13 DIAGNOSIS — R710 Precipitous drop in hematocrit: Secondary | ICD-10-CM

## 2022-03-13 LAB — HEMOGLOBIN AND HEMATOCRIT, BLOOD
HCT: 30.2 % — ABNORMAL LOW (ref 39.0–52.0)
Hemoglobin: 9.5 g/dL — ABNORMAL LOW (ref 13.0–17.0)

## 2022-03-13 LAB — CBC
HCT: 26.7 % — ABNORMAL LOW (ref 39.0–52.0)
Hemoglobin: 7.9 g/dL — ABNORMAL LOW (ref 13.0–17.0)
MCH: 24.8 pg — ABNORMAL LOW (ref 26.0–34.0)
MCHC: 29.6 g/dL — ABNORMAL LOW (ref 30.0–36.0)
MCV: 84 fL (ref 80.0–100.0)
Platelets: 290 10*3/uL (ref 150–400)
RBC: 3.18 MIL/uL — ABNORMAL LOW (ref 4.22–5.81)
RDW: 23.2 % — ABNORMAL HIGH (ref 11.5–15.5)
WBC: 7.5 10*3/uL (ref 4.0–10.5)
nRBC: 0 % (ref 0.0–0.2)

## 2022-03-13 LAB — PREPARE RBC (CROSSMATCH)

## 2022-03-13 MED ORDER — SODIUM CHLORIDE 0.9% IV SOLUTION: Freq: Once | INTRAVENOUS | Status: AC

## 2022-03-13 MED ORDER — PANTOPRAZOLE 2 MG/ML SUSPENSION
40.0000 mg | Freq: Every day | ORAL | Status: DC
  Administered 2022-03-13 – 2022-03-16 (×3): 40 mg via ORAL
  Filled 2022-03-13 (×4): qty 20

## 2022-03-13 MED ORDER — PEG-KCL-NACL-NASULF-NA ASC-C 100 G PO SOLR
0.5000 | Freq: Once | ORAL | Status: AC
  Administered 2022-03-13: 100 g via ORAL
  Filled 2022-03-13 (×2): qty 1

## 2022-03-13 MED ORDER — PEG-KCL-NACL-NASULF-NA ASC-C 100 G PO SOLR
0.5000 | Freq: Once | ORAL | Status: AC
  Administered 2022-03-13: 100 g via ORAL

## 2022-03-13 MED ORDER — PEG-KCL-NACL-NASULF-NA ASC-C 100 G PO SOLR: 1.0000 | Freq: Once | ORAL | Status: DC

## 2022-03-13 NOTE — Anesthesia Preprocedure Evaluation (Signed)
Anesthesia Evaluation  Patient identified by MRN, date of birth, ID band Patient awake    Reviewed: Allergy & Precautions, NPO status , Patient's Chart, lab work & pertinent test results  History of Anesthesia Complications Negative for: history of anesthetic complications  Airway Mallampati: II  TM Distance: >3 FB Neck ROM: Full    Dental no notable dental hx. (+) Dental Advisory Given   Pulmonary asthma , Current Smoker,    Pulmonary exam normal        Cardiovascular hypertension, Pt. on medications  Rhythm:Regular Rate:Tachycardia     Neuro/Psych Seizures -,     GI/Hepatic GERD  ,(+)     substance abuse  ,   Endo/Other  negative endocrine ROS  Renal/GU negative Renal ROS     Musculoskeletal negative musculoskeletal ROS (+)   Abdominal   Peds  Hematology  (+) Blood dyscrasia, anemia ,   Anesthesia Other Findings   Reproductive/Obstetrics                            Anesthesia Physical Anesthesia Plan  ASA: 3  Anesthesia Plan: MAC   Post-op Pain Management: Minimal or no pain anticipated   Induction:   PONV Risk Score and Plan: 0 and Ondansetron and Propofol infusion  Airway Management Planned: Natural Airway  Additional Equipment:   Intra-op Plan:   Post-operative Plan:   Informed Consent: I have reviewed the patients History and Physical, chart, labs and discussed the procedure including the risks, benefits and alternatives for the proposed anesthesia with the patient or authorized representative who has indicated his/her understanding and acceptance.     Dental advisory given  Plan Discussed with: Anesthesiologist and CRNA  Anesthesia Plan Comments:        Anesthesia Quick Evaluation

## 2022-03-13 NOTE — H&P (View-Only) (Signed)
Consultation  Referring Provider: Dr. Curlene Dolphin    Primary Care Physician:  Alcus Dad, MD Primary Gastroenterologist: Althia Forts        Reason for Consultation: Bleeding hemorrhoids             HPI:   Timothy Hubbard is a 31 y.o. male with a past medical history of hypertension, asthma and seizures, initially presented to the hospital on 03/01/2022 with reported multiple seizures and altered mental status.  It was felt that patient's seizure was provoked secondary to polypharmacy/alcohol cessation.  Apparently had some iron deficiency anemia with a hemoglobin of 6.3 and received 2 units of PRBCs 03/05/2022--> 8.7.  Patient was admitted to rehab.  We are called now in regards to rectal bleeding thought from hemorrhoids.    Per rehab note today around 920 this morning patient had blood loss of around 30 to 50 cc at least a large menstrual pad of blood while having a bowel movement, he became tachycardic to 125, BP stable and denied dizziness or lightheadedness.  Upon external inspection patient had large red for external hemorrhoids per their note which appeared nonthrombosed.  No visible blood was seen in the stool.  Stat CBC was ordered and we were consulted.    Hemoglobin 8.7 on 03/11/2022--> 7.9 this morning.    Today, patient is unable to provide much of his history as he has some aphasia after recent seizures.  Some of his history is received from nurse practitioner over the phone and his nurse.  Apparently patient had a bowel movement this morning, no one is sure if he was straining or if it was hard but they do not think so and had quite a large amount of rectal bleeding afterwards.  He has already been treated twice daily with Anusol cream over the past 2 to 3 days per nursing staff for his hemorrhoids.  He does not really like anyone touching him.  He denies any rectal pain.  Per nurse practitioner she plans on giving him a unit of blood today.  He has a background of iron deficiency  anemia though not sure what from.    Denies fever or chills, abdominal pain or rectal pain.     Past Medical History:  Diagnosis Date   Asthma    HTN (hypertension)    Surgical History: None  Family History  Problem Relation Age of Onset   Hypertension Mother    Cancer Maternal Grandfather        doesn't know the type    Social History   Tobacco Use   Smoking status: Every Day    Packs/day: 0.50    Years: 8.00    Total pack years: 4.00    Types: Cigarettes    Start date: 2009   Smokeless tobacco: Never  Vaping Use   Vaping Use: Every day  Substance Use Topics   Alcohol use: Yes    Alcohol/week: 2.0 standard drinks of alcohol    Types: 2 Cans of beer per week    Comment: every 2-3 days   Drug use: Yes    Frequency: 7.0 times per week    Types: Marijuana    Comment: 3-5 times per day    Prior to Admission medications   Medication Sig Start Date End Date Taking? Authorizing Provider  acetaminophen (TYLENOL) 325 MG tablet Take 2 tablets (650 mg total) by mouth every 6 (six) hours as needed for mild pain or moderate pain. 03/10/22   Pokhrel,  Laxman, MD  albuterol (PROVENTIL) (2.5 MG/3ML) 0.083% nebulizer solution Inhale 3 mLs into the lungs every 4 (four) hours as needed for wheezing or shortness of breath. 03/10/22   Pokhrel, Corrie Mckusick, MD  amLODipine (NORVASC) 5 MG tablet Take 1 tablet (5 mg total) by mouth daily. 03/10/22   Pokhrel, Corrie Mckusick, MD  cetaphil (CETAPHIL) lotion Apply topically daily. 03/11/22   Pokhrel, Corrie Mckusick, MD  divalproex (DEPAKOTE) 500 MG DR tablet Take 1 tablet (500 mg total) by mouth 2 (two) times daily. 03/10/22   Pokhrel, Corrie Mckusick, MD  docusate sodium (COLACE) 100 MG capsule Take 1 capsule (100 mg total) by mouth 2 (two) times daily as needed for mild constipation. 03/10/22   Pokhrel, Corrie Mckusick, MD  folic acid (FOLVITE) 1 MG tablet Take 1 tablet (1 mg total) by mouth daily. 03/10/22 03/10/23  Pokhrel, Corrie Mckusick, MD  magnesium oxide (MAG-OX) 400 (240 Mg) MG tablet Take 1 tablet  (400 mg total) by mouth 2 (two) times daily. 03/10/22   Pokhrel, Corrie Mckusick, MD  mometasone-formoterol (DULERA) 100-5 MCG/ACT AERO Inhale 2 puffs into the lungs 2 (two) times daily. 03/10/22   Pokhrel, Corrie Mckusick, MD  Multiple Vitamin (MULTIVITAMIN WITH MINERALS) TABS tablet Take 1 tablet by mouth daily. 03/11/22   Pokhrel, Laxman, MD  OLANZapine (ZYPREXA) 5 MG tablet Take 1 tablet (5 mg total) by mouth at bedtime. 03/10/22   Pokhrel, Corrie Mckusick, MD  polyethylene glycol (MIRALAX / GLYCOLAX) 17 g packet Take 17 g by mouth daily as needed for moderate constipation. 03/10/22   Pokhrel, Corrie Mckusick, MD  thiamine 100 MG tablet Take 1 tablet (100 mg total) by mouth daily. 03/10/22   Pokhrel, Corrie Mckusick, MD    Current Facility-Administered Medications  Medication Dose Route Frequency Provider Last Rate Last Admin   acetaminophen (TYLENOL) 160 MG/5ML solution 650 mg  650 mg Oral Q6H PRN Angiulli, Lavon Paganini, PA-C       acetaminophen (TYLENOL) tablet 650 mg  650 mg Oral Q6H PRN Cathlyn Parsons, PA-C   650 mg at 03/12/22 2009   albuterol (PROVENTIL) (2.5 MG/3ML) 0.083% nebulizer solution 3 mL  3 mL Inhalation Q4H PRN Angiulli, Lavon Paganini, PA-C       amLODipine (NORVASC) tablet 10 mg  10 mg Oral Daily Charlett Blake, MD   10 mg at 03/13/22 6283   cetaphil lotion   Topical Daily Cathlyn Parsons, PA-C   Given at 03/13/22 6629   divalproex (DEPAKOTE SPRINKLE) capsule 500 mg  500 mg Oral Q12H Cathlyn Parsons, PA-C   500 mg at 03/13/22 4765   docusate sodium (COLACE) capsule 100 mg  100 mg Oral BID PRN Cathlyn Parsons, PA-C       folic acid (FOLVITE) tablet 1 mg  1 mg Oral Daily Cathlyn Parsons, PA-C   1 mg at 03/13/22 4650   hydrocortisone (ANUSOL-HC) 2.5 % rectal cream   Rectal BID Cathlyn Parsons, PA-C   Given at 03/12/22 1956   lip balm (BLISTEX) ointment   Topical PRN Cathlyn Parsons, PA-C       magnesium oxide (MAG-OX) tablet 400 mg  400 mg Oral BID Cathlyn Parsons, PA-C   400 mg at 03/13/22 3546    mometasone-formoterol (DULERA) 100-5 MCG/ACT inhaler 2 puff  2 puff Inhalation BID Cathlyn Parsons, PA-C   2 puff at 03/13/22 5681   multivitamin with minerals tablet 1 tablet  1 tablet Oral Daily Cathlyn Parsons, PA-C   1 tablet at 03/13/22 2751   nicotine (NICODERM  CQ - dosed in mg/24 hours) patch 21 mg  21 mg Transdermal Daily Cathlyn Parsons, PA-C   21 mg at 03/13/22 0731   OLANZapine (ZYPREXA) tablet 5 mg  5 mg Oral QHS AngiulliLavon Paganini, PA-C   5 mg at 03/12/22 1959   pantoprazole sodium (PROTONIX) 40 mg/20 mL oral suspension 40 mg  40 mg Oral Daily Louanne Belton, RPH   40 mg at 03/13/22 1031   thiamine tablet 100 mg  100 mg Oral Daily Cathlyn Parsons, PA-C   100 mg at 03/13/22 7017    Allergies as of 03/10/2022 - Review Complete 03/10/2022  Allergen Reaction Noted   Ibuprofen Anaphylaxis 01/01/2021     Review of Systems:    Constitutional: No weight loss, fever or chills Skin: No rash  Cardiovascular: No chest pain  Respiratory: No SOB  Gastrointestinal: See HPI and otherwise negative Genitourinary: No dysuria Neurological: No headache, dizziness or syncope Musculoskeletal: No new muscle or joint pain Hematologic: No bruising Psychiatric: No history of depression or anxiety    Physical Exam:  Vital signs in last 24 hours: Temp:  [97.6 F (36.4 C)-98.5 F (36.9 C)] 98.2 F (36.8 C) (07/09 0927) Pulse Rate:  [75-125] 125 (07/09 0927) Resp:  [14-20] 18 (07/09 0927) BP: (132-162)/(76-114) 136/86 (07/09 0927) SpO2:  [98 %-100 %] 100 % (07/09 0927) Weight:  [60 kg] 60 kg (07/09 0500) Last BM Date : 03/11/22 General:   AA male appears to be in NAD, Well developed, Well nourished, alert and mostly cooperative Head:  Normocephalic and atraumatic. Eyes:   PEERL, EOMI. No icterus. Conjunctiva pink. Ears:  Normal auditory acuity. Neck:  Supple Throat: Oral cavity and pharynx without inflammation, swelling or lesion. Teeth in good condition. Lungs: Respirations  even and unlabored. Lungs clear to auscultation bilaterally.   No wheezes, crackles, or rhonchi.  Heart: Normal S1, S2. No MRG. Regular rate and rhythm. No peripheral edema, cyanosis or pallor.  Abdomen:  Soft, nondistended, nontender. No rebound or guarding. Normal bowel sounds. No appreciable masses or hepatomegaly. Rectal:  External: 2 hemorrhoids (likely external though couldn't ger a great look), non-thrombosed, no visible signs of bleeding (patient would not allow me to look much further)  Msk:  Symmetrical without gross deformities. Peripheral pulses intact.  Extremities:  Without edema, no deformity or joint abnormality. Normal ROM, normal sensation. Neurologic:  Alert and  oriented. Aphasia/difficulty with word formation Skin:   Dry and intact without significant lesions or rashes. Psychiatric: A little irrational, not allowing me to complete rectal exam  LAB RESULTS: Recent Labs    03/11/22 0502 03/13/22 0940  WBC 8.3 7.5  HGB 8.7* 7.9*  HCT 29.3* 26.7*  PLT 244 290   BMET Recent Labs    03/11/22 0502  NA 143  K 3.9  CL 115*  CO2 21*  GLUCOSE 88  BUN 5*  CREATININE 0.59*  CALCIUM 8.2*   LFT Recent Labs    03/11/22 0502  PROT 5.2*  ALBUMIN 2.5*  AST 25  ALT 19  ALKPHOS 60  BILITOT 0.6     Impression / Plan:   Impression: 1.  Rectal bleeding: With hemoglobin dropped from 8.7 on 7/7--> 7.9 today, 1 episode of large-volume rectal bleeding per nursing staff, visible hemorrhoids the patient will not allow full rectal exam; consider hemorrhoids versus other source 2.  Iron deficiency anemia: Does not look like anyone has ever figured out what from, but patient previously required 1 unit of PRBCs this hospitalization  and is now getting another after bleeding today 3.  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  Plan for colonoscopy tomorrow for further evaluation of rectal bleeding and iron deficiency anemia with Dr. Bryan Lemma.   Did discuss risks, benefits, limitations, alternatives and patient agrees to proceed. 2.  Patient will be on a clear liquid diet and n.p.o. at midnight 3.  Continue to monitor hemoglobin with transfusion as needed less than 7 4.  Patient has been ordered MoviPrep which he will start at 3:00 in split dose fashion.  Thank you for your kind consultation, we will continue to follow. Lavone Nian [facility]  03/13/2022, 10:38 AM

## 2022-03-13 NOTE — Progress Notes (Addendum)
Received call from bedside nurse, Josh RN and also from [place], [place] that patient had blood loss of ~30-50 cc, at least a large menstrual pad of blood while having a BM today.  Patient is tachycardic to 125, BP is stable at 136/86, denies dizziness or lightheadedness.  Upon external inspection has large grade 4 external hemorrhoids, appears non-thrombosed.  Denies prior bleeding with hemorrhoids. Per nurse and PT stool itself was not dark and there was no visible blood seen in stool.  Denies, nausea, vomiting or other GI symptoms.  Last Hgb 8.7, STAT CBC ordered, and GI consult placed.  Spoke with Ellouise Newer, PA with [facility], who will come to evaluate patient.  Continue to monitor yellow MEWS for tachycardia and also evaluate blood loss with menstrual pad.  Per notes, has history of iron deficiency anemia and required 2 U RBC's on 7/1 for a Hgb of 6.3, stabilized to 8.7 s/p transfusion.

## 2022-03-13 NOTE — IPOC Note (Signed)
Overall Plan of Care [facility]) Patient Details Name: Timothy Hubbard MRN: [id number] DOB: 1990/12/12  Admitting Diagnosis: Encephalopathy acute  Hospital Problems: Principal Problem:   Encephalopathy acute Active Problems:   Acute encephalopathy     Functional Problem List: Nursing Endurance, Pain, Safety, Medication Management, Bladder  PT Balance, Behavior, Safety, Motor  OT Balance, Cognition, Perception, Motor, Safety, Vision  SLP Cognition, Motor  TR         Basic ADL's: OT Grooming, Bathing, Dressing, Toileting     Advanced  ADL's: OT Simple Meal Preparation, Light Housekeeping     Transfers: PT Bed Mobility, Bed to Chair, Teacher, early years/pre, Tub/Shower     Locomotion: PT Ambulation, Stairs     Additional Impairments: OT    SLP Swallowing, Communication, Social Cognition expression Awareness, Attention  TR      Anticipated Outcomes Item Anticipated Outcome  Self Feeding    Swallowing      Basic self-care  Supervision  Toileting  Supervision   Bathroom Transfers Supervision  Bowel/Bladder  manage bladder w toileting  Transfers  supervision d/t cognition  Locomotion  supervision d/t cognition  Communication  sup A  Cognition  sup A - will re-assess following additional assessment  Pain  n/a  Safety/Judgment  maintain safety w cues   Therapy Plan: PT Intensity: Minimum of 1-2 x/day ,45 to 90 minutes PT Frequency: 5 out of 7 days PT Duration Estimated Length of Stay: 10-14 days OT Intensity: Minimum of 1-2 x/day, 45 to 90 minutes OT Frequency: 5 out of 7 days OT Duration/Estimated Length of Stay: 10-14 SLP Intensity: Minumum of 1-2 x/day, 30 to 90 minutes SLP Frequency: 3 to 5 out of 7 days SLP Duration/Estimated Length of Stay: 10-14 days   Team Interventions: Nursing Interventions Patient/Family Education, Pain Management, Bladder Management, Disease Management/Prevention, Medication Management, Discharge Planning  PT interventions  Ambulation/gait training, Cognitive remediation/compensation, Discharge planning, DME/adaptive equipment instruction, Functional mobility training, Pain management, Psychosocial support, Splinting/orthotics, Therapeutic Activities, UE/LE Strength taining/ROM, Visual/perceptual remediation/compensation, Wheelchair propulsion/positioning, UE/LE Coordination activities, Therapeutic Exercise, Stair training, Skin care/wound management, Patient/family education, Neuromuscular re-education, Functional electrical stimulation, Disease management/prevention, Academic librarian, Training and development officer  OT Interventions Training and development officer, Academic librarian, Brewing technologist, Barrister's clerk education, Self Care/advanced ADL retraining, UE/LE Coordination activities, Therapeutic Exercise, Therapeutic Activities, Functional mobility training, Discharge planning, Cognitive remediation/compensation, DME/adaptive equipment instruction, Psychosocial support  SLP Interventions Cognitive remediation/compensation, Internal/external aids, Speech/Language facilitation, Functional tasks, Patient/family education, Therapeutic Activities  TR Interventions    SW/CM Interventions Discharge Planning, Psychosocial Support, Patient/Family Education   Barriers to Discharge MD  Medical stability and rectal bleeding  Nursing Decreased caregiver support, Home environment access/layout 1 level 5ste bil rails, mother to assist  PT Inaccessible home environment, Decreased caregiver support, Home environment access/layout, Behavior    OT      SLP      SW Insurance for SNF coverage     Team Discharge Planning: Destination: PT-Home ,OT- Home , SLP-Home Projected Follow-up: PT-Outpatient PT, OT-  Home health OT, SLP-Outpatient SLP Projected Equipment Needs: PT-To be determined, OT- Tub/shower bench, SLP-None recommended by SLP Equipment Details: PT- , OT-  Patient/family involved in discharge  planning: PT- Patient unable/family or caregiver not available,  OT-Patient, SLP-Patient, Family member/caregiver  MD ELOS: 10-14d Medical Rehab Prognosis:  Good Assessment: The patient has been admitted for CIR therapies with the diagnosis of encephalopathy. The team will be addressing functional mobility, strength, stamina, balance, safety, adaptive techniques and equipment, self-care, bowel and bladder mgt, patient and  caregiver education, . Goals have been set at supervision. Anticipated discharge destination is home .        See Team Conference Notes for weekly updates to the plan of care

## 2022-03-13 NOTE — Progress Notes (Signed)
PROGRESS NOTE   Subjective/Complaints:  Saw patient in room while working with PT.  Therapy was getting him back to bed following episode of rectal bleeding, thought to be due to hemorrhoids, following bowel movement today. Report by PT was that he had bright red blood per rectum following BM about the size to saturate a menstrual pad.  Patient denies straining or other episodes of rectal bleeding.  Denies nausea, vomiting, coffee ground emesis, dizziness, lightheadedness, or weakness.  He is aware of ELOS ROS Review of Systems  Respiratory:  Negative for shortness of breath.   Cardiovascular:  Negative for chest pain.  Gastrointestinal:  Negative for abdominal pain, constipation, diarrhea, heartburn, nausea and vomiting.       Rectal bleeding, thought to be due to hemorrhoids, following bowel movement today.  Bright red blood per rectum.  Neurological:  Negative for dizziness and headaches.      Objective:   No results found. Recent Labs    03/11/22 0502 03/13/22 0940  WBC 8.3 7.5  HGB 8.7* 7.9*  HCT 29.3* 26.7*  PLT 244 290    Recent Labs    03/11/22 0502  NA 143  K 3.9  CL 115*  CO2 21*  GLUCOSE 88  BUN 5*  CREATININE 0.59*  CALCIUM 8.2*     Intake/Output Summary (Last 24 hours) at 03/13/2022 1434 Last data filed at 03/13/2022 7106 Gross per 24 hour  Intake 636 ml  Output 250 ml  Net 386 ml         Physical Exam: Vital Signs Blood pressure (!) 151/99, pulse 100, temperature 98.5 F (36.9 C), resp. rate 15, height '5\' 4"'$  (1.626 m), weight 60 kg, SpO2 100 %.   General: No acute distress Mood and affect are appropriate Heart: tachycardic with HR of 125. no rubs murmurs or extra sounds Lungs: Clear to auscultation, breathing unlabored, no rales or wheezes Abdomen: Positive bowel sounds, soft nontender to palpation, non-distended GI/GU: Bright right blood per rectum bleeding after BM, estimated 30-50  cc blood.  Anal/Rectal: Externally anal area was visualized, has large external hemorrhoids, non-thrombosed, still bleeding.  Rectal exam not performed. Extremities: No clubbing, cyanosis, or edema Skin: No evidence of breakdown, no evidence of rash Neurologic: No dizziness, or lightheadedness following rectal bleeding today. Cranial nerves II through XII intact, motor strength is 5/5 in bilateral deltoid, bicep, tricep, grip, hip flexor, knee extensors, ankle dorsiflexor and plantar flexor Sensory exam normal sensation to light touch and proprioception in bilateral upper and lower extremities Cerebellar exam normal finger to nose to finger as well as heel to shin in bilateral upper and lower extremities Musculoskeletal: Full range of motion in all 4 extremities. No joint swelling Skin:    General: Skin is warm and dry.  Note: Skin around buttocks/anal area sensitive appears to still be bleeding.   Neurological:     Comments:  Makes eye contact with examiner.  He is a bit anxious but does participate with exam.  Provided his name.  Follows simple commands. Very dysarthric- hard to understand at times Intact to light touch in all 4 extremities  Psychiatric:     Comments: Flat affect   Assessment/Plan:  1. Functional deficits which require 3+ hours per day of interdisciplinary therapy in a comprehensive inpatient rehab setting. Physiatrist is providing close team supervision and 24 hour management of active medical problems listed below. Physiatrist and rehab team continue to assess barriers to discharge/monitor patient progress toward functional and medical goals  Care Tool:  Bathing    Body parts bathed by patient: Right arm, Left arm, Chest, Abdomen, Front perineal area, Buttocks, Right upper leg, Left upper leg, Right lower leg, Left lower leg, Face         Bathing assist Assist Level: Minimal Assistance - Patient > 75%     Upper Body Dressing/Undressing Upper body dressing    What is the patient wearing?: Pull over shirt    Upper body assist Assist Level: Contact Guard/Touching assist    Lower Body Dressing/Undressing Lower body dressing      What is the patient wearing?: Pants, Underwear/pull up     Lower body assist Assist for lower body dressing: Minimal Assistance - Patient > 75%     Toileting Toileting    Toileting assist Assist for toileting: Minimal Assistance - Patient > 75%     Transfers Chair/bed transfer  Transfers assist     Chair/bed transfer assist level: Minimal Assistance - Patient > 75%     Locomotion Ambulation   Ambulation assist      Assist level: Minimal Assistance - Patient > 75% Assistive device: No Device Max distance: 15   Walk 10 feet activity   Assist     Assist level: Minimal Assistance - Patient > 75% Assistive device: No Device   Walk 50 feet activity   Assist    Assist level: Minimal Assistance - Patient > 75% Assistive device: No Device    Walk 150 feet activity   Assist Walk 150 feet activity did not occur: Safety/medical concerns         Walk 10 feet on uneven surface  activity   Assist Walk 10 feet on uneven surfaces activity did not occur: Safety/medical concerns         Wheelchair     Assist Is the patient using a wheelchair?: No (pt ambulating during session)             Wheelchair 50 feet with 2 turns activity    Assist    Wheelchair 50 feet with 2 turns activity did not occur: N/A       Wheelchair 150 feet activity     Assist  Wheelchair 150 feet activity did not occur: N/A       Blood pressure (!) 151/99, pulse 100, temperature 98.5 F (36.9 C), resp. rate 15, height '5\' 4"'$  (1.626 m), weight 60 kg, SpO2 100 %.    Medical Problem List and Plan: 1. Functional deficits secondary to acute encephalopathy provoked secondary to polypharmacy/alcohol cessation.             -patient may  shower             -ELOS/Goals: 10-14 days-  Supervision to min A  -PT/OT/SLP evaluations 2.  Antithrombotics: -DVT/anticoagulation:  Mechanical: Antiembolism stockings, thigh (TED hose) Bilateral lower extremities             -antiplatelet therapy: N/A 3. Pain Management: Tylenol as needed 4. Mood/Sleep: Provide emotional support             -antipsychotic agents: Zyprexa 5 mg nightly 5. Neuropsych/cognition: This patient is not capable of making decisions on his own behalf. 6. Skin/Wound  Care: Routine skin checks 7. Fluids/Electrolytes/Nutrition: Routine INO's with follow-up chemistries 8.  Seizure prophylaxis.  Depakote 500 mg twice daily.  EEG negative 9.  Hypertension.  Norvasc 5 mg daily 7/9 SBP 130's-150's primary team to consider adjustments for tighter BP control. Vitals:   03/13/22 0927 03/13/22 1140  BP: 136/86 (!) 151/99  Pulse: (!) 125 100  Resp: 18 15  Temp: 98.2 F (36.8 C) 98.5 F (36.9 C)  SpO2: 100% 100%    10.  Asthma.  Continue Dulera as well as nebulizer as needed.  Check oxygen saturations every shift 11.  History of alcohol tobacco abuse.  Counseling. 7/9 No withdrawal symptoms. 12.  Iron deficiency anemia.  Patient did receive 2 units packed red blood cell 03/05/2022.  Follow-up CBC stable at 8.7 on 03/11/22 7/9 Hgb dropped today to 7.9, following episode rectal bleeding.  1U PRBC's ordered, will obtain post transfusion H/H. 13.  GERD.  Protonix 14/ Low albumin  -Monitor PO intake, discussed eating foods with protein 15. Bright red blood per rectum bleeding following BM  7/9 Occurred while working with PT today, about 30-50 cc estimated to saturate a menstrual pad.  Patient denies straining or other episodes of rectal bleeding.  Denies nausea, vomiting, coffee ground emesis, dizziness, lightheadedness, or weakness.  However, became tachycardic with HR of 125. -Hgb dropped today to 7.9, following episode rectal bleeding.  -1U PRBC's ordered, will obtain post transfusion H/H. GI consulted, with pt unwilling  to participate in rectal exam.  GI recommendations: Plan for colonoscopy tomorrow 7/10 for further evaluation of rectal bleeding and iron deficiency anemia with Dr. Bryan Lemma.   -Patient will be on a clear liquid diet and n.p.o. at midnight 7/10 -Patient has been ordered MoviPrep which he will start at 3:00 in split dose fashion.    LOS: 3 days A FACE TO FACE EVALUATION WAS PERFORMED  Luetta Nutting 03/13/2022, 2:34 PM

## 2022-03-13 NOTE — Consult Note (Addendum)
Consultation  Referring Provider: Dr. Curlene Dolphin    Primary Care Physician:  Alcus Dad, MD Primary Gastroenterologist: Althia Forts        Reason for Consultation: Bleeding hemorrhoids             HPI:   Timothy Hubbard is a 30 y.o. male with a past medical history of hypertension, asthma and seizures, initially presented to the hospital on 03/01/2022 with reported multiple seizures and altered mental status.  It was felt that patient's seizure was provoked secondary to polypharmacy/alcohol cessation.  Apparently had some iron deficiency anemia with a hemoglobin of 6.3 and received 2 units of PRBCs 03/05/2022--> 8.7.  Patient was admitted to rehab.  We are called now in regards to rectal bleeding thought from hemorrhoids.    Per rehab note today around 920 this morning patient had blood loss of around 30 to 50 cc at least a large menstrual pad of blood while having a bowel movement, he became tachycardic to 125, BP stable and denied dizziness or lightheadedness.  Upon external inspection patient had large red for external hemorrhoids per their note which appeared nonthrombosed.  No visible blood was seen in the stool.  Stat CBC was ordered and we were consulted.    Hemoglobin 8.7 on 03/11/2022--> 7.9 this morning.    Today, patient is unable to provide much of his history as he has some aphasia after recent seizures.  Some of his history is received from nurse practitioner over the phone and his nurse.  Apparently patient had a bowel movement this morning, no one is sure if he was straining or if it was hard but they do not think so and had quite a large amount of rectal bleeding afterwards.  He has already been treated twice daily with Anusol cream over the past 2 to 3 days per nursing staff for his hemorrhoids.  He does not really like anyone touching him.  He denies any rectal pain.  Per nurse practitioner she plans on giving him a unit of blood today.  He has a background of iron deficiency  anemia though not sure what from.    Denies fever or chills, abdominal pain or rectal pain.     Past Medical History:  Diagnosis Date   Asthma    HTN (hypertension)    Surgical History: None  Family History  Problem Relation Age of Onset   Hypertension Mother    Cancer Maternal Grandfather        doesn't know the type    Social History   Tobacco Use   Smoking status: Every Day    Packs/day: 0.50    Years: 8.00    Total pack years: 4.00    Types: Cigarettes    Start date: 2009   Smokeless tobacco: Never  Vaping Use   Vaping Use: Every day  Substance Use Topics   Alcohol use: Yes    Alcohol/week: 2.0 standard drinks of alcohol    Types: 2 Cans of beer per week    Comment: every 2-3 days   Drug use: Yes    Frequency: 7.0 times per week    Types: Marijuana    Comment: 3-5 times per day    Prior to Admission medications   Medication Sig Start Date End Date Taking? Authorizing Provider  acetaminophen (TYLENOL) 325 MG tablet Take 2 tablets (650 mg total) by mouth every 6 (six) hours as needed for mild pain or moderate pain. 03/10/22   Pokhrel,  Laxman, MD  albuterol (PROVENTIL) (2.5 MG/3ML) 0.083% nebulizer solution Inhale 3 mLs into the lungs every 4 (four) hours as needed for wheezing or shortness of breath. 03/10/22   Pokhrel, Corrie Mckusick, MD  amLODipine (NORVASC) 5 MG tablet Take 1 tablet (5 mg total) by mouth daily. 03/10/22   Pokhrel, Corrie Mckusick, MD  cetaphil (CETAPHIL) lotion Apply topically daily. 03/11/22   Pokhrel, Corrie Mckusick, MD  divalproex (DEPAKOTE) 500 MG DR tablet Take 1 tablet (500 mg total) by mouth 2 (two) times daily. 03/10/22   Pokhrel, Corrie Mckusick, MD  docusate sodium (COLACE) 100 MG capsule Take 1 capsule (100 mg total) by mouth 2 (two) times daily as needed for mild constipation. 03/10/22   Pokhrel, Corrie Mckusick, MD  folic acid (FOLVITE) 1 MG tablet Take 1 tablet (1 mg total) by mouth daily. 03/10/22 03/10/23  Pokhrel, Corrie Mckusick, MD  magnesium oxide (MAG-OX) 400 (240 Mg) MG tablet Take 1 tablet  (400 mg total) by mouth 2 (two) times daily. 03/10/22   Pokhrel, Corrie Mckusick, MD  mometasone-formoterol (DULERA) 100-5 MCG/ACT AERO Inhale 2 puffs into the lungs 2 (two) times daily. 03/10/22   Pokhrel, Corrie Mckusick, MD  Multiple Vitamin (MULTIVITAMIN WITH MINERALS) TABS tablet Take 1 tablet by mouth daily. 03/11/22   Pokhrel, Laxman, MD  OLANZapine (ZYPREXA) 5 MG tablet Take 1 tablet (5 mg total) by mouth at bedtime. 03/10/22   Pokhrel, Corrie Mckusick, MD  polyethylene glycol (MIRALAX / GLYCOLAX) 17 g packet Take 17 g by mouth daily as needed for moderate constipation. 03/10/22   Pokhrel, Corrie Mckusick, MD  thiamine 100 MG tablet Take 1 tablet (100 mg total) by mouth daily. 03/10/22   Pokhrel, Corrie Mckusick, MD    Current Facility-Administered Medications  Medication Dose Route Frequency Provider Last Rate Last Admin   acetaminophen (TYLENOL) 160 MG/5ML solution 650 mg  650 mg Oral Q6H PRN Angiulli, Lavon Paganini, PA-C       acetaminophen (TYLENOL) tablet 650 mg  650 mg Oral Q6H PRN Cathlyn Parsons, PA-C   650 mg at 03/12/22 2009   albuterol (PROVENTIL) (2.5 MG/3ML) 0.083% nebulizer solution 3 mL  3 mL Inhalation Q4H PRN Angiulli, Lavon Paganini, PA-C       amLODipine (NORVASC) tablet 10 mg  10 mg Oral Daily Charlett Blake, MD   10 mg at 03/13/22 6283   cetaphil lotion   Topical Daily Cathlyn Parsons, PA-C   Given at 03/13/22 1517   divalproex (DEPAKOTE SPRINKLE) capsule 500 mg  500 mg Oral Q12H Cathlyn Parsons, PA-C   500 mg at 03/13/22 6160   docusate sodium (COLACE) capsule 100 mg  100 mg Oral BID PRN Cathlyn Parsons, PA-C       folic acid (FOLVITE) tablet 1 mg  1 mg Oral Daily Cathlyn Parsons, PA-C   1 mg at 03/13/22 7371   hydrocortisone (ANUSOL-HC) 2.5 % rectal cream   Rectal BID Cathlyn Parsons, PA-C   Given at 03/12/22 1956   lip balm (BLISTEX) ointment   Topical PRN Cathlyn Parsons, PA-C       magnesium oxide (MAG-OX) tablet 400 mg  400 mg Oral BID Cathlyn Parsons, PA-C   400 mg at 03/13/22 0626    mometasone-formoterol (DULERA) 100-5 MCG/ACT inhaler 2 puff  2 puff Inhalation BID Cathlyn Parsons, PA-C   2 puff at 03/13/22 9485   multivitamin with minerals tablet 1 tablet  1 tablet Oral Daily Cathlyn Parsons, PA-C   1 tablet at 03/13/22 4627   nicotine (NICODERM  CQ - dosed in mg/24 hours) patch 21 mg  21 mg Transdermal Daily Cathlyn Parsons, PA-C   21 mg at 03/13/22 0731   OLANZapine (ZYPREXA) tablet 5 mg  5 mg Oral QHS AngiulliLavon Paganini, PA-C   5 mg at 03/12/22 1959   pantoprazole sodium (PROTONIX) 40 mg/20 mL oral suspension 40 mg  40 mg Oral Daily Louanne Belton, RPH   40 mg at 03/13/22 1031   thiamine tablet 100 mg  100 mg Oral Daily Cathlyn Parsons, PA-C   100 mg at 03/13/22 8099    Allergies as of 03/10/2022 - Review Complete 03/10/2022  Allergen Reaction Noted   Ibuprofen Anaphylaxis 01/01/2021     Review of Systems:    Constitutional: No weight loss, fever or chills Skin: No rash  Cardiovascular: No chest pain  Respiratory: No SOB  Gastrointestinal: See HPI and otherwise negative Genitourinary: No dysuria Neurological: No headache, dizziness or syncope Musculoskeletal: No new muscle or joint pain Hematologic: No bruising Psychiatric: No history of depression or anxiety    Physical Exam:  Vital signs in last 24 hours: Temp:  [97.6 F (36.4 C)-98.5 F (36.9 C)] 98.2 F (36.8 C) (07/09 0927) Pulse Rate:  [75-125] 125 (07/09 0927) Resp:  [14-20] 18 (07/09 0927) BP: (132-162)/(76-114) 136/86 (07/09 0927) SpO2:  [98 %-100 %] 100 % (07/09 0927) Weight:  [60 kg] 60 kg (07/09 0500) Last BM Date : 03/11/22 General:   AA male appears to be in NAD, Well developed, Well nourished, alert and mostly cooperative Head:  Normocephalic and atraumatic. Eyes:   PEERL, EOMI. No icterus. Conjunctiva pink. Ears:  Normal auditory acuity. Neck:  Supple Throat: Oral cavity and pharynx without inflammation, swelling or lesion. Teeth in good condition. Lungs: Respirations  even and unlabored. Lungs clear to auscultation bilaterally.   No wheezes, crackles, or rhonchi.  Heart: Normal S1, S2. No MRG. Regular rate and rhythm. No peripheral edema, cyanosis or pallor.  Abdomen:  Soft, nondistended, nontender. No rebound or guarding. Normal bowel sounds. No appreciable masses or hepatomegaly. Rectal:  External: 2 hemorrhoids (likely external though couldn't ger a great look), non-thrombosed, no visible signs of bleeding (patient would not allow me to look much further)  Msk:  Symmetrical without gross deformities. Peripheral pulses intact.  Extremities:  Without edema, no deformity or joint abnormality. Normal ROM, normal sensation. Neurologic:  Alert and  oriented. Aphasia/difficulty with word formation Skin:   Dry and intact without significant lesions or rashes. Psychiatric: A little irrational, not allowing me to complete rectal exam  LAB RESULTS: Recent Labs    03/11/22 0502 03/13/22 0940  WBC 8.3 7.5  HGB 8.7* 7.9*  HCT 29.3* 26.7*  PLT 244 290   BMET Recent Labs    03/11/22 0502  NA 143  K 3.9  CL 115*  CO2 21*  GLUCOSE 88  BUN 5*  CREATININE 0.59*  CALCIUM 8.2*   LFT Recent Labs    03/11/22 0502  PROT 5.2*  ALBUMIN 2.5*  AST 25  ALT 19  ALKPHOS 60  BILITOT 0.6     Impression / Plan:   Impression: 1.  Rectal bleeding: With hemoglobin dropped from 8.7 on 7/7--> 7.9 today, 1 episode of large-volume rectal bleeding per nursing staff, visible hemorrhoids the patient will not allow full rectal exam; consider hemorrhoids versus other source 2.  Iron deficiency anemia: Does not look like anyone has ever figured out what from, but patient previously required 1 unit of PRBCs this hospitalization  and is now getting another after bleeding today 3.  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  Plan for colonoscopy tomorrow for further evaluation of rectal bleeding and iron deficiency anemia with Dr. Bryan Lemma.   Did discuss risks, benefits, limitations, alternatives and patient agrees to proceed. 2.  Patient will be on a clear liquid diet and n.p.o. at midnight 3.  Continue to monitor hemoglobin with transfusion as needed less than 7 4.  Patient has been ordered MoviPrep which he will start at 3:00 in split dose fashion.  Thank you for your kind consultation, we will continue to follow. Lavone Nian [facility]  03/13/2022, 10:38 AM

## 2022-03-13 NOTE — Progress Notes (Addendum)
Physical Therapy Session Note  Patient Details  Name: Timothy Hubbard MRN: [id number] Date of Birth: [birth year]  Today's Date: 03/13/2022 PT Individual [id number]  - 1000, 75 min     Short Term Goals: Week 1:  PT Short Term Goal 1 (Week 1): pt will ambulate x 150 ft with CGA or better PT Short Term Goal 2 (Week 1): Pt will initiate stair training PT Short Term Goal 3 (Week 1): Pt will ambulate in distracting environment without LOB  Skilled Therapeutic Interventions/Progress Updates: pt received resting in bed.  He denied pain.  Bed mobility in flat bed no rails, supervision. Pt donned boxers and hosptial pants with min assist in standing/sitting.  Gait into/out of BR x 15' without AD, min assist.  Toilet transfer with min assist.  Pt continent of B and B.  Pt had significant bleeding from hemorrhoids.  Josh, RN arrived and helped pt with rectal med, but he called Lattie Haw, NP.  Pt bled through boxers and hospital pants in a few minutes.  Pt sat at sink and washed hands, face,  and brushed teeth with set up.  Lattie Haw, NP ordered stat CBC, requested Gi consult,  and advised pt to stay in bed for now.  Pt doffed boxers and pants in bed with supervision.  He donned mesh briefs with maternity pad in it and hospital pants, extra time and supervision.  neuromuscular re-education via multimodal cues and demo, for supine- bil ankle pumps, bil ankle eversion, R/L straight leg raises, cervical flexion, R/L shoulder protraction with extended elbow.  Focus on slow speed for smoothness of movement. Pt had the most difficulty with isolated LUE movements; often lifting LLE at the same time. He tends to plantar flex bil feet at rest.  At end of session, pt resting in bed, with needs at hand and bed alarm set.  PT stressed to pt that he must call for staff if he needs to use toilet.  Pt verbalized understanding.  He stated that he has poor awareness of need to void or have BM; PT passed this information to [place], Therapist, sports.        Therapy Documentation Precautions:  Precautions Precautions: Fall, Other (comment) Precaution Comments: seizure Restrictions Weight Bearing Restrictions: No General:   Vital Signs: Therapy Vitals Temp: 97.6 F (36.4 C) Pulse Rate: (!) 116 Resp: 20 BP: (!) 157/114 Patient Position (if appropriate): Lying Oxygen Therapy SpO2: 100 % O2 Device: Room Air Pain: Pain Assessment Pain Scale: 0-10 Pain Score: 0-No pain      Therapy/Group: Individual Therapy  Hortensia Duffin 03/13/2022, 7:45 AM

## 2022-03-14 ENCOUNTER — Encounter (HOSPITAL_COMMUNITY): Payer: Self-pay | Admitting: Physical Medicine & Rehabilitation

## 2022-03-14 ENCOUNTER — Inpatient Hospital Stay (HOSPITAL_COMMUNITY): Payer: Medicaid Other | Admitting: Anesthesiology

## 2022-03-14 ENCOUNTER — Encounter (HOSPITAL_COMMUNITY)
Admission: RE | Disposition: A | Discharge status: Home / Self Care | Payer: Self-pay | Source: Intra-hospital | Attending: Physical Medicine & Rehabilitation

## 2022-03-14 DIAGNOSIS — I10 Essential (primary) hypertension: Secondary | ICD-10-CM

## 2022-03-14 DIAGNOSIS — K635 Polyp of colon: Secondary | ICD-10-CM

## 2022-03-14 DIAGNOSIS — K642 Third degree hemorrhoids: Secondary | ICD-10-CM

## 2022-03-14 DIAGNOSIS — K921 Melena: Secondary | ICD-10-CM

## 2022-03-14 DIAGNOSIS — F17210 Nicotine dependence, cigarettes, uncomplicated: Secondary | ICD-10-CM

## 2022-03-14 DIAGNOSIS — D124 Benign neoplasm of descending colon: Secondary | ICD-10-CM

## 2022-03-14 DIAGNOSIS — K649 Unspecified hemorrhoids: Secondary | ICD-10-CM

## 2022-03-14 HISTORY — PX: COLONOSCOPY WITH PROPOFOL: SHX5780

## 2022-03-14 HISTORY — PX: POLYPECTOMY: SHX5525

## 2022-03-14 LAB — TYPE AND SCREEN
ABO/RH(D): B POS
Antibody Screen: NEGATIVE
Unit division: 0

## 2022-03-14 LAB — BPAM RBC
Blood Product Expiration Date: 202308032359
ISSUE DATE / TIME: 202307091444
Unit Type and Rh: 7300

## 2022-03-14 SURGERY — COLONOSCOPY WITH PROPOFOL
Anesthesia: Monitor Anesthesia Care

## 2022-03-14 MED ORDER — LIDOCAINE 2% (20 MG/ML) 5 ML SYRINGE
INTRAMUSCULAR | Status: DC | PRN
  Administered 2022-03-14: 40 mg via INTRAVENOUS

## 2022-03-14 MED ORDER — LACTATED RINGERS IV SOLN: INTRAVENOUS | Status: DC | PRN

## 2022-03-14 MED ORDER — PROPOFOL 10 MG/ML IV BOLUS
INTRAVENOUS | Status: DC | PRN
  Administered 2022-03-14: 80 mg via INTRAVENOUS

## 2022-03-14 MED ORDER — PROPOFOL 500 MG/50ML IV EMUL
INTRAVENOUS | Status: DC | PRN
  Administered 2022-03-14: 150 ug/kg/min via INTRAVENOUS

## 2022-03-14 MED ORDER — PHENYLEPHRINE HCL-NACL 20-0.9 MG/250ML-% IV SOLN
INTRAVENOUS | Status: DC | PRN
  Administered 2022-03-14: 8 ug via INTRAVENOUS

## 2022-03-14 SURGICAL SUPPLY — 22 items

## 2022-03-14 NOTE — Progress Notes (Signed)
Physical Therapy Session Note  Patient Details  Name: Timothy Hubbard MRN: [id number] Date of Birth: Apr 06, 1991  Today's Date: 03/14/2022 PT Individual Time: 5465-0354 PT Individual Time Calculation (min): 29 min   Short Term Goals: Week 1:  PT Short Term Goal 1 (Week 1): pt will ambulate x 150 ft with CGA or better PT Short Term Goal 2 (Week 1): Pt will initiate stair training PT Short Term Goal 3 (Week 1): Pt will ambulate in distracting environment without LOB  Skilled Therapeutic Interventions/Progress Updates:  Patient seated EOB eating lunch on entrance to room. Patient alert and agreeable to PT session. Uses both UE to combine food into one container and put to side.   Patient with no pain complaint throughout session.  Therapeutic Activity: Transfers: Patient performed sit<>stand and stand pivot transfers throughout session with supervision. Provided verbal cues for performing with decreasing amount of UE assist.  Neuromuscular Re-ed: NMR facilitated during session with focus on standing balance, cognition/ speech intelligibility, LUE use. Pt guided in sit<>stands  with reach of L hand to PT's hand as target in differing heights and distances from pt. With each hit of target, pt instructed to name a food that starts with a different letter of the alphabet. Unable to provide "D" food, gives "cake" and "quiche" as options for letter "K". NMR performed for improvements in motor control and coordination, balance, sequencing, judgement, and self confidence/ efficacy in performing all aspects of mobility at highest level of independence.   Patient seated EOB and setting up food again at end of session with brakes locked, bed alarm set, and all needs within reach.   Therapy Documentation Precautions:  Precautions Precautions: Fall, Other (comment) Precaution Comments: seizure Restrictions Weight Bearing Restrictions: No General:   Vital Signs: Therapy Vitals Temp: (!) 97.5 F  (36.4 C) Temp Source: Oral Pulse Rate: (!) 110 Resp: 19 BP: (!) 138/103 Patient Position (if appropriate): Sitting Oxygen Therapy SpO2: 100 % O2 Device: Room Air Pain:  No pain complaint this session.   Therapy/Group: Individual Therapy  Alger Simons PT, DPT, CSRS 03/14/2022, 5:48 PM

## 2022-03-14 NOTE — Anesthesia Postprocedure Evaluation (Signed)
Anesthesia Post Note  Patient: Timothy Hubbard  Procedure(s) Performed: COLONOSCOPY WITH PROPOFOL     Patient location during evaluation: Endoscopy Anesthesia Type: MAC Level of consciousness: awake and alert Pain management: pain level controlled Vital Signs Assessment: post-procedure vital signs reviewed and stable Respiratory status: spontaneous breathing and respiratory function stable Cardiovascular status: stable Postop Assessment: no apparent nausea or vomiting Anesthetic complications: no   No notable events documented.  Last Vitals:  Vitals:   03/14/22 0902 03/14/22 0915  BP: 109/77 125/88  Pulse: (!) 108 (!) 105  Resp: 20 20  Temp: 36.7 C   SpO2: 100% 100%    Last Pain:  Vitals:   03/14/22 0915  TempSrc:   PainSc: 0-No pain                 Nohemy Koop DANIEL

## 2022-03-14 NOTE — Progress Notes (Addendum)
PROGRESS NOTE   Subjective/Complaints: Colonoscopy this AM was completed. Bleeding due to large hemorrhoids. 1U PRBC given yesterday.   ROS Review of Systems  Respiratory:  Negative for cough and shortness of breath.   Cardiovascular:  Negative for chest pain.  Gastrointestinal:  Positive for blood in stool. Negative for abdominal pain, constipation, diarrhea, heartburn, nausea and vomiting.  Neurological:  Negative for dizziness and headaches.      Objective:   No results found. Recent Labs    03/13/22 0940 03/13/22 2207  WBC 7.5  --   HGB 7.9* 9.5*  HCT 26.7* 30.2*  PLT 290  --     No results for input(s): "NA", "K", "CL", "CO2", "GLUCOSE", "BUN", "CREATININE", "CALCIUM" in the last 72 hours.   Intake/Output Summary (Last 24 hours) at 03/14/2022 1932 Last data filed at 03/14/2022 1829 Gross per 24 hour  Intake 340 ml  Output --  Net 340 ml         Physical Exam: Vital Signs Blood pressure (!) 138/103, pulse (!) 110, temperature (!) 97.5 F (36.4 C), temperature source Oral, resp. rate 19, height '5\' 4"'$  (1.626 m), weight 60.1 kg, SpO2 100 %.   General: No acute distress Mood and affect are appropriate Heart: tachycardic with HR of 110. no rubs murmurs or extra sounds Lungs: Clear to auscultation, breathing unlabored, no rales or wheezes Abdomen: Positive bowel sounds, soft nontender to palpation, non-distended Extremities: No clubbing, cyanosis, or edema Skin: No evidence of breakdown, no evidence of rash Neurologic: No dizziness, or lightheadedness following rectal bleeding today. Cranial nerves II through XII intact, motor strength is 5/5 in bilateral deltoid, bicep, tricep, grip, hip flexor, knee extensors, ankle dorsiflexor and plantar flexor Sensory exam normal sensation to light touch and proprioception in bilateral upper and lower extremities Cerebellar exam normal finger to nose to finger as  well as heel to shin in bilateral upper and lower extremities Musculoskeletal: Full range of motion in all 4 extremities. No joint swelling Skin:    General: Skin is warm and dry.  Neurological:     Comments:  Makes eye contact with examiner.  He is a bit anxious but does participate with exam.  Provided his name.  Follows simple commands. Very dysarthric- hard to understand at times Intact to light touch in all 4 extremities  Psychiatric:     Comments: Flat affect   Assessment/Plan: 1. Functional deficits which require 3+ hours per day of interdisciplinary therapy in a comprehensive inpatient rehab setting. Physiatrist is providing close team supervision and 24 hour management of active medical problems listed below. Physiatrist and rehab team continue to assess barriers to discharge/monitor patient progress toward functional and medical goals  Care Tool:  Bathing    Body parts bathed by patient: Right arm, Left arm, Chest, Abdomen, Front perineal area, Buttocks, Right upper leg, Left upper leg, Right lower leg, Left lower leg, Face         Bathing assist Assist Level: Minimal Assistance - Patient > 75%     Upper Body Dressing/Undressing Upper body dressing   What is the patient wearing?: Pull over shirt    Upper body assist Assist Level: Contact Guard/Touching  assist    Lower Body Dressing/Undressing Lower body dressing      What is the patient wearing?: Pants, Underwear/pull up     Lower body assist Assist for lower body dressing: Minimal Assistance - Patient > 75%     Toileting Toileting    Toileting assist Assist for toileting: Minimal Assistance - Patient > 75%     Transfers Chair/bed transfer  Transfers assist     Chair/bed transfer assist level: Minimal Assistance - Patient > 75%     Locomotion Ambulation   Ambulation assist      Assist level: Minimal Assistance - Patient > 75% Assistive device: No Device Max distance: 15   Walk 10 feet  activity   Assist     Assist level: Minimal Assistance - Patient > 75% Assistive device: No Device   Walk 50 feet activity   Assist    Assist level: Minimal Assistance - Patient > 75% Assistive device: No Device    Walk 150 feet activity   Assist Walk 150 feet activity did not occur: Safety/medical concerns         Walk 10 feet on uneven surface  activity   Assist Walk 10 feet on uneven surfaces activity did not occur: Safety/medical concerns         Wheelchair     Assist Is the patient using a wheelchair?: No (pt ambulating during session)             Wheelchair 50 feet with 2 turns activity    Assist            Wheelchair 150 feet activity     Assist          Blood pressure (!) 138/103, pulse (!) 110, temperature (!) 97.5 F (36.4 C), temperature source Oral, resp. rate 19, height '5\' 4"'$  (1.626 m), weight 60.1 kg, SpO2 100 %.    Medical Problem List and Plan: 1. Functional deficits secondary to acute encephalopathy provoked secondary to polypharmacy/alcohol cessation.             -patient may  shower             -ELOS/Goals: 10-14 days- Supervision to min A  -CIR cont PT/OT/SLP 2.  Antithrombotics: -DVT/anticoagulation:  Mechanical: Antiembolism stockings, thigh (TED hose) Bilateral lower extremities             -antiplatelet therapy: N/A 3. Pain Management: Tylenol as needed 4. Mood/Sleep: Provide emotional support             -antipsychotic agents: Zyprexa 5 mg nightly 5. Neuropsych/cognition: This patient is not capable of making decisions on his own behalf. 6. Skin/Wound Care: Routine skin checks 7. Fluids/Electrolytes/Nutrition: Routine INO's with follow-up chemistries 8.  Seizure prophylaxis.  Depakote 500 mg twice daily.  EEG negative 9.  Hypertension.  Norvasc 5 mg daily 7/9 SBP 130's-150's primary team to consider adjustments for tighter BP control. 7/10 continue to monitor trend Vitals:   03/14/22 0943 03/14/22  1435  BP: (!) 151/102 (!) 138/103  Pulse: 86 (!) 110  Resp: 18 19  Temp: 98.4 F (36.9 C) (!) 97.5 F (36.4 C)  SpO2: 100% 100%    10.  Asthma.  Continue Dulera as well as nebulizer as needed.  Check oxygen saturations every shift 11.  History of alcohol tobacco abuse.  Counseling. 7/9 No withdrawal symptoms. 12.  Iron deficiency anemia.  Patient did receive 2 units packed red blood cell 03/05/2022.  Follow-up CBC stable at 8.7 on 03/11/22  7/9 Hgb dropped today to 7.9, following episode rectal bleeding.  1U PRBC's ordered, will obtain post transfusion H/H. -7/10 CBC improved to 9.5 after transfusion 13.  GERD.  Protonix 14/ Low albumin  -Monitor PO intake, discussed eating foods with protein 15. Bright red blood per rectum bleeding following BM  7/9 Occurred while working with PT today, about 30-50 cc estimated to saturate a menstrual pad.  Patient denies straining or other episodes of rectal bleeding.  Denies nausea, vomiting, coffee ground emesis, dizziness, lightheadedness, or weakness.  However, became tachycardic with HR of 125. -Hgb dropped today to 7.9, following episode rectal bleeding.  -1U PRBC's ordered, will obtain post transfusion H/H. GI consulted, with pt unwilling to participate in rectal exam.  GI recommendations: Plan for colonoscopy tomorrow 7/10 for further evaluation of rectal bleeding and iron deficiency anemia with Dr. Bryan Lemma.   -Patient will be on a clear liquid diet and n.p.o. at midnight 7/10 -Patient has been ordered MoviPrep which he will start at 3:00 in split dose fashion.  -7/10 Bleeding hemorrhoids on colonoscopy noted. Per GI conservative therapy recommended with keeping stools soft/no straining. If not improved consider colorectal surgery consult(likely as outpatient)   LOS: 4 days A FACE TO FACE EVALUATION WAS PERFORMED  Jennye Boroughs 03/14/2022, 7:32 PM

## 2022-03-14 NOTE — Progress Notes (Signed)
Speech Language Pathology Daily Session Note  Patient Details  Name: Timothy Hubbard MRN: [id number] Date of Birth: 1990-11-13  Today's Date: 03/14/2022 SLP Individual Time: 3903-0092 SLP Individual Time Calculation (min): 60 min  Short Term Goals: Week 1: SLP Short Term Goal 1 (Week 1): Patient will participate in further cognitive-linguistic evaluation SLP Short Term Goal 2 (Week 1): Patient will implement speech intelligibility strategies at the sentence level with min A verbal cues to achieve 80-90% intelligibility SLP Short Term Goal 3 (Week 1): Patient will monitor and self correct speech errors during 75% of opportunities with min A verbal cues SLP Short Term Goal 4 (Week 1): Pt will demonstrate awareness of deficits by anticipating various needs at home based on current limitations with min A verbal cues SLP Short Term Goal 5 (Week 1): Patient will complete mildly complex problem solving tasks with min A verbal cues  Skilled Therapeutic Interventions:Skilled ST services focused on cognitive skills. Pt was limited in participation at time and perseverative on PO consumption. SLP communicated with nurse and then at the end of the session PA about diet advancement following AM procedure, " can advance as tolerated, starting with full liquids and then soft diet." SLP provided full liquid snacks (applesauce and thin via straw) pt consumed with no observed difficulty and SLP notified PA to continue further advancement. SLP facilitated use of speech intelligibility strategies during snack trials. Pt demonstrated initially 50% intelligibility at the sentence level with max A fading to mod A verbal cues to slow rate and pause/take a breath every 2-3 words increasing to 70% intelligibility in structured tasks. Pt demonstrated ability to follow novel directions and basic problem solving in card task Blink, however when complexity was increased to mildly complex pt required max A fade to mod A verbal cues  and several rounds of instruction. Pt required cues for sustained attention ( distracted by cell phone) in 3 minute intervals. Pt was left in room with call bell within reach and bed alarm set. SLP recommends to continue skilled services.     Pain Pain Assessment Pain Score: 0-No pain  Therapy/Group: Individual Therapy  Oveda Dadamo  [facility] 03/14/2022, 1:37 PM

## 2022-03-14 NOTE — Op Note (Signed)
[facility] Patient Name: Timothy Hubbard Procedure Date : 03/14/2022 MRN: [id number] Attending MD: Gerrit Heck , MD Date of Birth: 08/06/1991 CSN: [id number] Age: 31 Admit Type: Inpatient Procedure:                Colonoscopy Indications:              Hematochezia Providers:                Gerrit Heck, MD, Elmer Ramp. Tilden Dome, RN, Cletis Athens, Technician Referring MD:              Medicines:                Monitored Anesthesia Care Complications:            No immediate complications. Estimated Blood Loss:     Estimated blood loss was minimal. Procedure:                Pre-Anesthesia Assessment:                           - Prior to the procedure, a History and Physical                            was performed, and patient medications and                            allergies were reviewed. The patient's tolerance of                            previous anesthesia was also reviewed. The risks                            and benefits of the procedure and the sedation                            options and risks were discussed with the patient.                            All questions were answered, and informed consent                            was obtained. Prior Anticoagulants: The patient has                            taken no previous anticoagulant or antiplatelet                            agents. ASA Grade Assessment: III - A patient with                            severe systemic disease. After reviewing the risks                            and  benefits, the patient was deemed in                            satisfactory condition to undergo the procedure.                           After obtaining informed consent, the colonoscope                            was passed under direct vision. Throughout the                            procedure, the patient's blood pressure, pulse, and                            oxygen saturations were monitored  continuously. The                            CF-HQ190L ([id number]) Olympus coloscope was                            introduced through the anus and advanced to the the                            terminal ileum. The colonoscopy was performed                            without difficulty. The patient tolerated the                            procedure well. The quality of the bowel                            preparation was adequate. The terminal ileum,                            ileocecal valve, appendiceal orifice, and rectum                            were photographed. Scope In: 8:42:21 AM Scope Out: 6:43:32 AM Scope Withdrawal Time: 0 hours 11 minutes 20 seconds  Total Procedure Duration: 0 hours 14 minutes 25 seconds  Findings:      Hemorrhoids were found on perianal exam.      A 5 mm polyp was found in the descending colon. The polyp was sessile.       The polyp was removed with a cold snare. Resection and retrieval were       complete. Estimated blood loss was minimal.      External and internal hemorrhoids were found during retroflexion. The       hemorrhoids were Grade III (internal hemorrhoids that prolapse but       require manual reduction).      The exam was otherwise normal throughout the remainder of the colon.      The terminal ileum appeared normal. Impression:               -  Hemorrhoids found on perianal exam.                           - One 5 mm polyp in the descending colon, removed                            with a cold snare. Resected and retrieved.                           - External and internal hemorrhoids.                           - The examined portion of the ileum was normal. Recommendation:           - Patient has a contact number available for                            emergencies. The signs and symptoms of potential                            delayed complications were discussed with the                            patient. Return to normal activities  tomorrow.                            Written discharge instructions were provided to the                            patient.                           - Started full liquids in PACU and can advance diet                            as tolerated on the floor.                           - Continue present medications.                           - Await pathology results.                           - Repeat colonoscopy for surveillance based on                            pathology results.                           - Resume Anusol topical x10 days.                           - High fiber diet and can add fiber supplement as  needed to keep stools soft without straining to                            have a bowel movement.                           - If hemorrhoids are still symptomatic despite                            appropriate conservative measures, can refer to                            outpatient Colo-rectal surgery for additional                            intervention.                           - Return to GI office PRN.                           - Results discussed with patient along with his                            mother by telephone per his request.                           - Inpatient GI service will sign off at this time.                            Please do not hesitate to contact the inpatient                            team with additional questions or concerns. Procedure Code(s):        --- Professional ---                           [phone], Colonoscopy, flexible; with removal of                            tumor(s), polyp(s), or other lesion(s) by snare                            technique Diagnosis Code(s):        --- Professional ---                           K64.2, Third degree hemorrhoids                           K63.5, Polyp of colon                           K92.1, Melena (includes Hematochezia) CPT copyright 2019 American Medical  Association. All rights reserved. The codes documented in this report are preliminary and upon coder review may  be revised to meet current compliance requirements. Gerrit Heck, MD 03/14/2022 9:11:27 AM Number of Addenda: 0

## 2022-03-14 NOTE — Progress Notes (Signed)
Post transfusion H/H completed, Hgb up to 9.5.

## 2022-03-14 NOTE — Transfer of Care (Signed)
Immediate Anesthesia Transfer of Care Note  Patient: Timothy Hubbard  Procedure(s) Performed: COLONOSCOPY WITH PROPOFOL  Patient Location: PACU  Anesthesia Type:MAC  Level of Consciousness: drowsy  Airway & Oxygen Therapy: Patient Spontanous Breathing  Post-op Assessment: Report given to RN and Post -op Vital signs reviewed and stable  Post vital signs: Reviewed and stable  Last Vitals:  Vitals Value Taken Time  BP 109/77 03/14/22 0902  Temp    Pulse 107 03/14/22 0903  Resp 18 03/14/22 0903  SpO2 100 % 03/14/22 0903  Vitals shown include unvalidated device data.  Last Pain:  Vitals:   03/14/22 0750  TempSrc: Temporal  PainSc: 0-No pain      Patients Stated Pain Goal: 0 (28/20/60 1561)  Complications: No notable events documented.

## 2022-03-14 NOTE — Anesthesia Procedure Notes (Signed)
Procedure Name: MAC Date/Time: 03/14/2022 7:38 AM  Performed by: Lorie Phenix, CRNAPre-anesthesia Checklist: Patient identified, Emergency Drugs available, Suction available and Patient being monitored Oxygen Delivery Method: Nasal cannula Placement Confirmation: positive ETCO2

## 2022-03-14 NOTE — Progress Notes (Signed)
Occupational Therapy Session Note  Patient Details  Name: Timothy Hubbard MRN: [id number] Date of Birth: March 12, 1991  Today's Date: 03/14/2022 OT Individual Time: 1300-1400 OT Individual Time Calculation (min): 60 min    Short Term Goals: Week 1:  OT Short Term Goal 1 (Week 1): Pt to be CGA for functional transfers OT Short Term Goal 2 (Week 1): Pt to be CGA for LB bathing/dressiong OT Short Term Goal 3 (Week 1): Pt to demonstrate improved perception during self care with min verbal cues OT Short Term Goal 4 (Week 1): Pt to demonstrate improved safety awareness with mod verbal cues provided to reduce risk for falls OT Short Term Goal 5 (Week 1): Pt to be CGA for toileting  Skilled Therapeutic Interventions/Progress Updates:    Upon OT arrival, pt seated EOB eating his lunch. Pt reports no pain and was agreeable to OT treatment session. Treatment intervention with a focus on self care training, functional mobility, IADLs, functional transfers, and strengthening. Pt completes sit to stand transfer with Supervision and ambulates within room to retrieve his shoes, donn them with Supervision, retrieve mask from closet with Supervision and donn Independently. Pt ambulates to rehab apartment with Supervision and makes bed with flat sheet, 2 pillow cases, and blanket. Pt able to complete without LOB or seated rest break. Pt completes with supervision. Pt completes stand to sit transfer into recliner with Supervision and takes seated rest break. Pt completes sit to stand transfer with Supervision and ambulates to bathroom with Supervision. Pt engages in tub transfer training with CGA. Pt with greater difficulty to lift L LE over edge of tub but reports it could be related to recent procedure. Pt ambulates to ortho gym with Supervision and completes B UE exercises with 4lb dumbbell while standing. Pt completes shoulder flex/ext, elbow flex/ext, chest press, and supination/pronation for 1x10 reps. Pt requires  seated rest breaks. Pt performs sit<>stand transfers with Supervision. Pt ambulates back to his room with Supervision, doffs shoes with Supervision, doffs mask Independently and completes stand to sit transfer with Supervision. Pt left EOB with all safety measures in place. Pt making progress towards stated OT goals and continues to benefit from OT services to maximize independence and safety during self care.   Therapy Documentation Precautions:  Precautions Precautions: Fall, Other (comment) Precaution Comments: seizure Restrictions Weight Bearing Restrictions: No    Therapy/Group: Individual Therapy  Marvetta Gibbons 03/14/2022, 2:05 PM

## 2022-03-14 NOTE — Interval H&P Note (Signed)
History and Physical Interval Note:  Transfused 2U pRBCs yesterday with post transfusion H/H 9.5/30. Tolerated bowel prep with only small amount of BRB reported.   03/14/2022 8:23 AM  Timothy Hubbard  has presented today for surgery, with the diagnosis of Rectal bleeding.  The various methods of treatment have been discussed with the patient and family. After consideration of risks, benefits and other options for treatment, the patient has consented to  Procedure(s): COLONOSCOPY WITH PROPOFOL (N/A) as a surgical intervention.  The patient's history has been reviewed, patient examined, no change in status, stable for surgery.  I have reviewed the patient's chart and labs.  Questions were answered to the patient's satisfaction.     Dominic Pea [name]

## 2022-03-14 NOTE — Progress Notes (Signed)
Physical Therapy Note  Patient Details  Name: Timothy Hubbard MRN: [id number] Date of Birth: March 28, 1991 Today's Date: 03/14/2022    Pt off the floor for procedure and missed 60 minutes of skilled physical therapy. PT will make up missed session as able.    Verl Dicker 03/14/2022, 12:30 PM

## 2022-03-15 ENCOUNTER — Inpatient Hospital Stay (HOSPITAL_COMMUNITY): Payer: Medicaid Other

## 2022-03-15 ENCOUNTER — Encounter (HOSPITAL_COMMUNITY): Payer: Self-pay | Admitting: Gastroenterology

## 2022-03-15 DIAGNOSIS — R319 Hematuria, unspecified: Secondary | ICD-10-CM

## 2022-03-15 LAB — URINALYSIS, ROUTINE W REFLEX MICROSCOPIC
Bilirubin Urine: NEGATIVE
Glucose, UA: NEGATIVE mg/dL
Ketones, ur: NEGATIVE mg/dL
Leukocytes,Ua: NEGATIVE
Nitrite: NEGATIVE
Protein, ur: NEGATIVE mg/dL
RBC / HPF: >50 RBC/hpf — ABNORMAL HIGH (ref 0–5)
Specific Gravity, Urine: 1.016 (ref 1.005–1.030)
pH: 7 (ref 5.0–8.0)

## 2022-03-15 LAB — SURGICAL PATHOLOGY

## 2022-03-15 MED ORDER — DOCUSATE SODIUM 100 MG PO CAPS
100.0000 mg | ORAL_CAPSULE | Freq: Two times a day (BID) | ORAL | Status: DC
  Administered 2022-03-16 – 2022-03-19 (×6): 100 mg via ORAL
  Filled 2022-03-15 (×8): qty 1

## 2022-03-15 MED ORDER — POLYETHYLENE GLYCOL 3350 17 G PO PACK: 17.0000 g | PACK | Freq: Every day | ORAL | Status: DC | PRN

## 2022-03-15 NOTE — Progress Notes (Signed)
Occupational Therapy Session Note  Patient Details  Name: Timothy Hubbard MRN: [id number] Date of Birth: [birth year]  Today's Date: 03/15/2022 OT Individual Time: 1025-1108 & 15:30 - 16:00 OT Individual Time Calculation (min): 43 min & 20 min    Short Term Goals: Week 1:  OT Short Term Goal 1 (Week 1): Pt to be CGA for functional transfers OT Short Term Goal 2 (Week 1): Pt to be CGA for LB bathing/dressiong OT Short Term Goal 3 (Week 1): Pt to demonstrate improved perception during self care with min verbal cues OT Short Term Goal 4 (Week 1): Pt to demonstrate improved safety awareness with mod verbal cues provided to reduce risk for falls OT Short Term Goal 5 (Week 1): Pt to be CGA for toileting  First Visit -- Skilled Therapeutic Interventions/Progress Updates:  Pt awake in bed upon OT arrival to the room. Pt reports, "I got it. I'm good" Pt in agreement for OT session.   Therapy Documentation Precautions:  Precautions Precautions: Fall, Other (comment) Precaution Comments: seizure Restrictions Weight Bearing Restrictions: No General: General PT Missed Treatment Reason: Unavailable (Comment) (eating breakfast) Vital Signs: Please see "Flowsheet" for most recent vitals charted by nursing. Pain: Pain Assessment Pain Scale: 0-10 Pain Score: 0-No pain  ADL: Upper Body Bathing: Supervision/safety (Pt able to bathe all UB parts with close supervision for safety while seated on tub-bench.) Where Assessed-Upper Body Bathing: Shower Lower Body Bathing: Contact guard (Pt able to bathe all LB parts with CGA for safety when standing to bathe peri-areas. Pt requires various VCs to sit to bathe vs standing to improve safety. However, pt continues to stand various times during shower.) Where Assessed-Lower Body Bathing: Shower Upper Body Dressing: Contact guard (Pt able to doff and don a pull-over style shirt with CGA for clothing retrieval and supervision for doff/don while seated.) Where  Assessed-Upper Body Dressing: Chair Lower Body Dressing: Contact guard (Pt requires CGA for clothing retrieval and for standing balance while pulling clothing up/down to ensure standing balance.) Where Assessed-Lower Body Dressing: Chair Walk-In Shower Transfer: Contact guard, Moderate cueing (Pt able to complete ambulatory transfer from EOB > walk-in shower with CGA for safety and moderate VCs for safe techniques.) Social research officer, government Method: Heritage manager: Transfer tub bench ADL Comments: Pt participates well in ADLs and is able to complete ADLs with CGA for standing balance/mobility. However, pt requires moderate VCs to use safe techniques and to perform tasks seated vs standing to improve safety. Pt demo's impulsivity with ADLs and requires close supervision - CGA to decrease fall risks.  Pt returned to bed at end of session. Pt left resting comfortably in bed with personal belongings and call light within reach, bed alarm on and activated, bed low position, 3 bed rails up, and comfort needs attended to.   Second Visit -- Skilled Therapeutic Interventions/Progress Updates:  Pt awake in bed upon OT arrival to the room. Pt reports, "I just can't get the words out sometimes." Pt in agreement for OT session.  Pain: Pain Scale: 0-10 Pain Rating: no pain  ADL: Pt participates in short ADL session in order to improve standing balance and independence with ADLs.  Grooming: close supervision - pt able to complete oral care and hair management with close supervision standing at the sink. Pt requires close supervision due to impulsivity. Pt requires minimal VC's for proper sequencing and recall of next steps in grooming. Pt attempts to start a new task in the middle of doing another. Pt  requires minimal prompts for redirection back to task.  Functional Mobility: Pt able to complete ambulatory transfer from EOB <> sink with close supervision for safety and no LOB noted.   Pt  returned to bed at end of session. Pt left resting comfortably in bed with personal belongings and call light within reach, bed alarm on and activated, bed in low position, 3 bed rails up, and comfort needs attended to.    Therapy/Group: Individual Therapy  [name] 03/15/2022, 1:02 PM

## 2022-03-15 NOTE — Progress Notes (Signed)
Physical Therapy Session Note  Patient Details  Name: Timothy Hubbard MRN: [id number] Date of Birth: [birth year]  Today's Date: 03/15/2022 PT Individual Time: 2500-3704 PT Individual Time Calculation (min): 30 min   Short Term Goals: Week 1:  PT Short Term Goal 1 (Week 1): pt will ambulate x 150 ft with CGA or better PT Short Term Goal 2 (Week 1): Pt will initiate stair training PT Short Term Goal 3 (Week 1): Pt will ambulate in distracting environment without LOB  Skilled Therapeutic Interventions/Progress Updates:    Attempted to see patient for scheduled PT session at 0900, pt received seated EOB eating breakfast. Pt given 15 min to finish eating breakfast. This therapist returned at 0915 and pt agreeable to PT session. No complaints of pain. Sit to stand and transfers with close Supervision during session. Ambulation up to 300 ft with no AD and CGA for safety. Pt does not attend to his environment and needs cues for safety. Quadruped alt UE and LE lifts x 10 reps. Attempt to transition to alt UE and LE lifts but pt unable to follow cues to safely perform exercise and attempts to lift ipsilateral UE and LE resulting in LOB onto mat table. Pt returned to standing at Supervision level. Pt able to shoot backetball hoops with min A needed for balance, especially when retrieving the ball with max cueing needed for safety to prevent a fall due to impulsivity and increased speed of movements. Pt reports urge to toilet at end of session, Supervision for toilet transfer. Pt left seated in toilet in care of NT at end of session.  Therapy Documentation Precautions:  Precautions Precautions: Fall, Other (comment) Precaution Comments: seizure Restrictions Weight Bearing Restrictions: No General: PT Amount of Missed Time (min): 15 Minutes PT Missed Treatment Reason: Unavailable (Comment) (eating breakfast)      Therapy/Group: Individual Therapy   Excell Seltzer, PT, DPT, CSRS 03/15/2022, 12:03 PM

## 2022-03-15 NOTE — Progress Notes (Signed)
Occupational Therapy Session Note  Patient Details  Name: Timothy Hubbard MRN: [id number] Date of Birth: 06-04-1991  Today's Date: 03/15/2022 OT Individual Time: 7096-2836 OT Individual Time Calculation (min): 27 min    Short Term Goals: Week 1:  OT Short Term Goal 1 (Week 1): Pt to be CGA for functional transfers OT Short Term Goal 2 (Week 1): Pt to be CGA for LB bathing/dressiong OT Short Term Goal 3 (Week 1): Pt to demonstrate improved perception during self care with min verbal cues OT Short Term Goal 4 (Week 1): Pt to demonstrate improved safety awareness with mod verbal cues provided to reduce risk for falls OT Short Term Goal 5 (Week 1): Pt to be CGA for toileting  Skilled Therapeutic Interventions/Progress Updates:    Pt received supine with no c/o pain, agreeable to make up time- OT session focused on dynamic standing balance and RUE coordination. He completed 250 ft of functional mobility to the therapy gym with (S), no LOB, appropriate pace. He completed Wii bowling game, with dynamic step and forward reach to challenge dynamic standing balance and functional activity tolerance. Initially for the first several swings he required mod cueing for motor planning but then was able to carryover for reminder of the game. He had no overt LOB but used single UE support intermittently. He returned to his room and was left supine with all needs met, bed alarm set.    Therapy Documentation Precautions:  Precautions Precautions: Fall, Other (comment) Precaution Comments: seizure Restrictions Weight Bearing Restrictions: No   Therapy/Group: Individual Therapy  Curtis Sites 03/15/2022, 1:15 PM

## 2022-03-15 NOTE — Progress Notes (Signed)
Physical Therapy Session Note  Patient Details  Name: Timothy Hubbard MRN: [id number] Date of Birth: May 10, 1991  Today's Date: 03/15/2022 PT Individual Time: 5400-8676 PT Individual Time Calculation (min): 39 min  and Today's Date: 03/15/2022 PT Missed Time: 21 Minutes Missed Time Reason: Other (Comment) (upon PT arrival pt eating lunch)  Short Term Goals: Week 1:  PT Short Term Goal 1 (Week 1): pt will ambulate x 150 ft with CGA or better PT Short Term Goal 2 (Week 1): Pt will initiate stair training PT Short Term Goal 3 (Week 1): Pt will ambulate in distracting environment without LOB  Skilled Therapeutic Interventions/Progress Updates:      Therapy Documentation Precautions:  Precautions Precautions: Fall, Other (comment) Precaution Comments: seizure Restrictions Weight Bearing Restrictions: No  Pt received seated edge of bed eating lunch and requested therapy allow him time to eat. Pt missed 21 minutes of skilled physical therapy due to eating. Pt ambulated 327 feet with no AD and supervision for verbal cues and safety to dayroom. Pt slightly impulsive with ambulation and requires cue to refrain from jumping to tap ceiling with gait. Pt performed dual-tasking interventions to improve motor control, cognitive function and balance. Pt read index card with list of words while ambulating and required CGA with minimal losses of balance where pt was able to recover safely utilizing a stepping strategy. Pt ambulated loop of 123 feet x 3 while performing reading/recall activity without an AD. Pt requires supervision for dynamic standing activities on level and unlevel ( Airex foam pad) surfaces for balance. Pt instructed to match playing cards while in standing position and was accurate approximately 60% of the time and requires cues for attention to task. Pt requires CGA for balance with dribbling basketball and shooting at target (hoop). Pt requires bilateral UE for dribbling and shooting. Pt  ambulated with supervision 372 feet from dayroom to hospital room and left seated edge of bed with bed alarm on, nurse in room and all needs within in reach.    Therapy/Group: Individual Therapy  Verl Dicker Verl Dicker PT, DPT  03/15/2022, 8:56 AM

## 2022-03-15 NOTE — Progress Notes (Signed)
PROGRESS NOTE   Subjective/Complaints: Denies GI bleeding but has noticed blood tinged urine.    ROS Review of Systems  Respiratory:  Negative for cough and shortness of breath.   Cardiovascular:  Negative for chest pain.  Gastrointestinal:  Negative for abdominal pain, blood in stool, constipation, diarrhea, heartburn, nausea and vomiting.  Genitourinary:  Positive for hematuria.  Neurological:  Negative for dizziness and headaches.      Objective:   No results found. Recent Labs    03/13/22 0940 03/13/22 2207  WBC 7.5  --   HGB 7.9* 9.5*  HCT 26.7* 30.2*  PLT 290  --     No results for input(s): "NA", "K", "CL", "CO2", "GLUCOSE", "BUN", "CREATININE", "CALCIUM" in the last 72 hours.   Intake/Output Summary (Last 24 hours) at 03/15/2022 0737 Last data filed at 03/15/2022 0101 Gross per 24 hour  Intake 700 ml  Output --  Net 700 ml         Physical Exam: Vital Signs Blood pressure (!) 157/104, pulse 99, temperature 98.6 F (37 C), resp. rate 18, height '5\' 4"'$  (1.626 m), weight 60.1 kg, SpO2 100 %.   General: No acute distress Mood and affect are appropriate Heart: RRR. no rubs murmurs or extra sounds Lungs: Clear to auscultation, breathing unlabored, no rales or wheezes Abdomen: Positive bowel sounds, soft nontender to palpation, non-distended Extremities: No clubbing, cyanosis, or edema Skin: No evidence of breakdown, no evidence of rash Neurologic: No dizziness, or lightheadedness following rectal bleeding today. Cranial nerves II through XII intact, motor strength is 5/5 in bilateral deltoid, bicep, tricep, grip, hip flexor, knee extensors, ankle dorsiflexor and plantar flexor Sensory exam normal sensation to light touch and proprioception in bilateral upper and lower extremities Cerebellar exam normal finger to nose to finger as well as heel to shin in bilateral upper and lower  extremities Musculoskeletal: Full range of motion in all 4 extremities. No joint swelling Skin:    General: Skin is warm and dry.  Neurological:     Comments:  Makes eye contact with examiner.  He is a bit anxious but does participate with exam.  Provided his name.  Follows simple commands. Very dysarthric- hard to understand at times Intact to light touch in all 4 extremities  Psychiatric:     Comments: Flat affect   Assessment/Plan: 1. Functional deficits which require 3+ hours per day of interdisciplinary therapy in a comprehensive inpatient rehab setting. Physiatrist is providing close team supervision and 24 hour management of active medical problems listed below. Physiatrist and rehab team continue to assess barriers to discharge/monitor patient progress toward functional and medical goals  Care Tool:  Bathing    Body parts bathed by patient: Right arm, Left arm, Chest, Abdomen, Front perineal area, Buttocks, Right upper leg, Left upper leg, Right lower leg, Left lower leg, Face         Bathing assist Assist Level: Minimal Assistance - Patient > 75%     Upper Body Dressing/Undressing Upper body dressing   What is the patient wearing?: Pull over shirt    Upper body assist Assist Level: Contact Guard/Touching assist    Lower Body Dressing/Undressing Lower body  dressing      What is the patient wearing?: Pants, Underwear/pull up     Lower body assist Assist for lower body dressing: Minimal Assistance - Patient > 75%     Toileting Toileting    Toileting assist Assist for toileting: Minimal Assistance - Patient > 75%     Transfers Chair/bed transfer  Transfers assist     Chair/bed transfer assist level: Minimal Assistance - Patient > 75%     Locomotion Ambulation   Ambulation assist      Assist level: Minimal Assistance - Patient > 75% Assistive device: No Device Max distance: 15   Walk 10 feet activity   Assist     Assist level: Minimal  Assistance - Patient > 75% Assistive device: No Device   Walk 50 feet activity   Assist    Assist level: Minimal Assistance - Patient > 75% Assistive device: No Device    Walk 150 feet activity   Assist Walk 150 feet activity did not occur: Safety/medical concerns         Walk 10 feet on uneven surface  activity   Assist Walk 10 feet on uneven surfaces activity did not occur: Safety/medical concerns         Wheelchair     Assist Is the patient using a wheelchair?: No (pt ambulating during session)             Wheelchair 50 feet with 2 turns activity    Assist            Wheelchair 150 feet activity     Assist          Blood pressure (!) 157/104, pulse 99, temperature 98.6 F (37 C), resp. rate 18, height '5\' 4"'$  (1.626 m), weight 60.1 kg, SpO2 100 %.    Medical Problem List and Plan: 1. Functional deficits secondary to acute encephalopathy provoked secondary to polypharmacy/alcohol cessation.             -patient may  shower             -ELOS/Goals: 10-14 days- Supervision to min A  -CIR cont PT/OT/SLP  -Team conference today 2.  Antithrombotics: -DVT/anticoagulation:  Mechanical: Antiembolism stockings, thigh (TED hose) Bilateral lower extremities             -antiplatelet therapy: N/A 3. Pain Management: Tylenol as needed 4. Mood/Sleep: Provide emotional support             -antipsychotic agents: Zyprexa 5 mg nightly 5. Neuropsych/cognition: This patient is not capable of making decisions on his own behalf. 6. Skin/Wound Care: Routine skin checks 7. Fluids/Electrolytes/Nutrition: Routine INO's with follow-up chemistries 8.  Seizure prophylaxis.  Depakote 500 mg twice daily.  EEG negative 9.  Hypertension.  Norvasc 5 mg daily 7/9 SBP 130's-150's primary team to consider adjustments for tighter BP control. 7/10 continue to monitor trend 7/11 Norvasc was  increased to '10mg'$  on 03/13/22, follow trend and consider additional  medication Vitals:   03/14/22 2035 03/15/22 0502  BP:  (!) 157/104  Pulse:  99  Resp:  18  Temp:  98.6 F (37 C)  SpO2: 96% 100%    10.  Asthma.  Continue Dulera as well as nebulizer as needed.  Check oxygen saturations every shift 11.  History of alcohol tobacco abuse.  Counseling. 7/9 No withdrawal symptoms. 12.  Iron deficiency anemia.  Patient did receive 2 units packed red blood cell 03/05/2022.  Follow-up CBC stable at 8.7 on 03/11/22 7/9  Hgb dropped today to 7.9, following episode rectal bleeding.  1U PRBC's ordered, will obtain post transfusion H/H. -7/10 CBC improved to 9.5 after transfusion -Recheck CBC tomorrow 13.  GERD.  Protonix 14/ Low albumin  -Monitor PO intake, discussed eating foods with protein 15. Bright red blood per rectum bleeding following BM  7/9 Occurred while working with PT today, about 30-50 cc estimated to saturate a menstrual pad.  Patient denies straining or other episodes of rectal bleeding.  Denies nausea, vomiting, coffee ground emesis, dizziness, lightheadedness, or weakness.  However, became tachycardic with HR of 125. -Hgb dropped today to 7.9, following episode rectal bleeding.  -1U PRBC's ordered, will obtain post transfusion H/H. GI consulted, with pt unwilling to participate in rectal exam.  GI recommendations: Plan for colonoscopy tomorrow 7/10 for further evaluation of rectal bleeding and iron deficiency anemia with Dr. Bryan Lemma.   -Patient will be on a clear liquid diet and n.p.o. at midnight 7/10 -Patient has been ordered MoviPrep which he will start at 3:00 in split dose fashion.  -7/10 Bleeding hemorrhoids on colonoscopy noted. Per GI conservative therapy recommended with keeping stools soft/no straining. If not improved consider colorectal surgery consult(likely as outpatient) 7/11 Docusate '100mg'$  BID, miralax prn 15. Hematuria  -U/A completed, will order KUB, consider U/S   LOS: 5 days A FACE TO FACE EVALUATION WAS PERFORMED  Timothy Hubbard 03/15/2022, 7:37 AM

## 2022-03-15 NOTE — Progress Notes (Signed)
Speech Language Pathology Daily Session Note  Patient Details  Name: Timothy Hubbard MRN: [id number] Date of Birth: Oct 06, 1990  Today's Date: 03/15/2022 SLP Individual Time: 0800-0900 SLP Individual Time Calculation (min): 60 min  Short Term Goals: Week 1: SLP Short Term Goal 1 (Week 1): Patient will participate in further cognitive-linguistic evaluation SLP Short Term Goal 2 (Week 1): Patient will implement speech intelligibility strategies at the sentence level with min A verbal cues to achieve 80-90% intelligibility SLP Short Term Goal 3 (Week 1): Patient will monitor and self correct speech errors during 75% of opportunities with min A verbal cues SLP Short Term Goal 4 (Week 1): Pt will demonstrate awareness of deficits by anticipating various needs at home based on current limitations with min A verbal cues SLP Short Term Goal 5 (Week 1): Patient will complete mildly complex problem solving tasks with min A verbal cues  Skilled Therapeutic Interventions: Pt seen for skilled ST with focus on cognitive and speech goals, pt sitting EOB and agreeable to therapeutic tasks. Pt intelligibility ~60% at sentence level this date, demonstrating increased awareness of communication breakdowns by saying "Slow down Yousuf, slow down" and repeating words and phrases. Reviewed "A BOSS" speech intelligibility strategies, benefits from mod A verbal cues to recall and utilize throughout tx. Pt continues with decreased sustained attention during functional and verbal tasks needing min-mod A cues for re-direction around 3 minute mark. SLP facilitating mildly complex problem solving tasks by providing overall min A cues for awareness of cognitive and physical impairments and their impact on daily tasks. Pt left EOB eating AM meal, cont ST POC.  Pain Pain Assessment Pain Scale: 0-10 Pain Score: 0-No pain  Therapy/Group: Individual Therapy  Dewaine Conger 03/15/2022, 8:59 AM

## 2022-03-15 NOTE — Progress Notes (Signed)
Received call from nurse reporting yellow MEWS  Hypertension and tachycardia. She was asked to check a Apical Pulse and Manual blood pressure. Blood Pressure 144/64 apical pulse 118. He was tachycardic earlier today, pulse ranging 110-114. Chart was reviewed. Vitals was reviewed, tachycardiac for the last few days , pulse ranging from 101- 116, we will encourage fluids. Re-check vitals in hour. If he remains tachycardic we will order fluid bolus. Nurse is aware of the above and verbalizes understanding.

## 2022-03-16 LAB — CBC
HCT: 29.8 % — ABNORMAL LOW (ref 39.0–52.0)
Hemoglobin: 9.3 g/dL — ABNORMAL LOW (ref 13.0–17.0)
MCH: 26.2 pg (ref 26.0–34.0)
MCHC: 31.2 g/dL (ref 30.0–36.0)
MCV: 83.9 fL (ref 80.0–100.0)
Platelets: 318 10*3/uL (ref 150–400)
RBC: 3.55 MIL/uL — ABNORMAL LOW (ref 4.22–5.81)
RDW: 22.7 % — ABNORMAL HIGH (ref 11.5–15.5)
WBC: 5.9 10*3/uL (ref 4.0–10.5)
nRBC: 0 % (ref 0.0–0.2)

## 2022-03-16 LAB — URINE CULTURE: Culture: NO GROWTH

## 2022-03-16 MED ORDER — LISINOPRIL 5 MG PO TABS
5.0000 mg | ORAL_TABLET | Freq: Every day | ORAL | Status: DC
  Administered 2022-03-16 – 2022-03-19 (×4): 5 mg via ORAL
  Filled 2022-03-16 (×4): qty 1

## 2022-03-16 NOTE — Progress Notes (Signed)
Physical Therapy Session Note  Patient Details  Name: SCOT SHIRAISHI MRN: [id number] Date of Birth: [birth year]  Today's Date: 03/16/2022 PT Individual Time: 1000-1056 PT Individual Time Calculation (min): 56 min   Short Term Goals: Week 1:  PT Short Term Goal 1 (Week 1): pt will ambulate x 150 ft with CGA or better PT Short Term Goal 2 (Week 1): Pt will initiate stair training PT Short Term Goal 3 (Week 1): Pt will ambulate in distracting environment without LOB  Skilled Therapeutic Interventions/Progress Updates:      Direct handoff of care from RN to start. Pt sitting EOB indep. Pt agreeable to therapy and denies pain. Mild impulsivity with standing without instruction but no LOB or unsteadiness observed - he did require supervision and cues for removing bed sheets to remove tripping hazard.   Pt ambulating within his room with distant supervision. Opening cabinets and retrieving his suitcase without assist. Pt standing and opening up duffle bag to remove clothes for him to wear later in the day. He donned shoes in standing without LOB.  Ambulated from his room to main rehab gym, 171f, with distant supervision and no AD, pace appropriate for age.   Instructed in BERG and FGA balance testing.  See below for details.  Patient demonstrates increased fall risk as noted by score of  53/56 on Berg Balance Scale.  (<36= high risk for falls, close to 100%; 37-45 significant >80%; 46-51 moderate >50%; 52-55 lower >25%)  Patient demonstrates increased fall risk as noted by score of 27/30 on  Functional Gait Assessment.   <22/30 = predictive of falls, <20/30 = fall in 6 months, <18/30 = predictive of falls in PD MCID: 5 points stroke population, 4 points geriatric population (ANPTA Core Set of Outcome Measures for Adults with Neurologic Conditions, 2018)  Pt ambulated throughout hospital (using elevator) with distant supervision and no AD, >10033f No LOB and pt able to manage turns without  difficulty. Pt noticing [employer] in [facility] of hospital and requesting to buy lunch. Returned to his room and pt retrieved his personal money to puEmergency planning/management officerRN made aware who approved, reporting no diet restrictions. Returned downstairs to [employer] was able to self select and purchase a cup of soup, bread, and a brownie. He was able to manage money without assist (whole dollar bills, no change).   Pt returned upstairs to his room while carrying the bag of [employer]. Concluded session seated EOB with bed alarm on, all needs met.   Therapy Documentation Precautions:  Precautions Precautions: Fall, Other (comment) Precaution Comments: seizure Restrictions Weight Bearing Restrictions: No General:    Balance: Balance Balance Assessed: Yes Standardized Balance Assessment Standardized Balance Assessment: Berg Balance Test;Functional Gait Assessment Berg Balance Test Sit to Stand: Able to stand without using hands and stabilize independently Standing Unsupported: Able to stand safely 2 minutes Sitting with Back Unsupported but Feet Supported on Floor or Stool: Able to sit safely and securely 2 minutes Stand to Sit: Sits safely with minimal use of hands Transfers: Able to transfer safely, minor use of hands Standing Unsupported with Eyes Closed: Able to stand 10 seconds safely Standing Ubsupported with Feet Together: Able to place feet together independently and stand 1 minute safely From Standing, Reach Forward with Outstretched Arm: Can reach confidently >25 cm (10") From Standing Position, Pick up Object from Floor: Able to pick up shoe safely and easily From Standing Position, Turn to Look Behind Over each Shoulder: Looks behind from both  sides and weight shifts well Turn 360 Degrees: Able to turn 360 degrees safely in 4 seconds or less Standing Unsupported, Alternately Place Feet on Step/Stool: Able to stand independently and safely and complete 8 steps in 20 seconds Standing  Unsupported, One Foot in Front: Able to place foot tandem independently and hold 30 seconds Standing on One Leg: Tries to lift leg/unable to hold 3 seconds but remains standing independently Total Score: 53 Functional Gait  Assessment Gait assessed : Yes Gait Level Surface: Walks 20 ft in less than 5.5 sec, no assistive devices, good speed, no evidence for imbalance, normal gait pattern, deviates no more than 6 in outside of the 12 in walkway width. Change in Gait Speed: Able to smoothly change walking speed without loss of balance or gait deviation. Deviate no more than 6 in outside of the 12 in walkway width. Gait with Horizontal Head Turns: Performs head turns smoothly with no change in gait. Deviates no more than 6 in outside 12 in walkway width Gait with Vertical Head Turns: Performs head turns with no change in gait. Deviates no more than 6 in outside 12 in walkway width. Gait and Pivot Turn: Pivot turns safely within 3 sec and stops quickly with no loss of balance. Step Over Obstacle: Is able to step over 2 stacked shoe boxes taped together (9 in total height) without changing gait speed. No evidence of imbalance. Gait with Narrow Base of Support: Ambulates 4-7 steps. Gait with Eyes Closed: Walks 20 ft, no assistive devices, good speed, no evidence of imbalance, normal gait pattern, deviates no more than 6 in outside 12 in walkway width. Ambulates 20 ft in less than 7 sec. Ambulating Backwards: Walks 20 ft, no assistive devices, good speed, no evidence for imbalance, normal gait Steps: Alternating feet, must use rail. Total Score: 27   Therapy/Group: Individual Therapy  Alger Simons 03/16/2022, 10:34 AM

## 2022-03-16 NOTE — Patient Care Conference (Signed)
Inpatient RehabilitationTeam Conference and Plan of Care Update Date: 03/16/2022   Time: 12:01 PM    Patient Name: Timothy Hubbard      Medical Record Number: [id number]  Date of Birth: Jan 16, 1991 Sex: Male         Room/Bed: 4M08C/4M08C-01 Payor Info: Payor: [place] / Plan: [place] MEDICAID [place] / Product Type: *No Product type* /    Admit Date/Time:  03/10/2022  4:16 PM  Primary Diagnosis:  Encephalopathy acute  Hospital Problems: Principal Problem:   Encephalopathy acute Active Problems:   Acute encephalopathy   Grade III internal hemorrhoids   Hematochezia   Adenomatous polyp of descending colon    Expected Discharge Date: Expected Discharge Date: 03/19/22  Team Members Present: Physician leading conference: Dr. Jennye Boroughs Social Worker Present: Ovidio Kin, LCSW Nurse Present: Dorien Chihuahua, RN PT Present: Becky Sax, PT OT Present: Meriel Pica, OT SLP Present: Weston Anna, SLP     Current Status/Progress Goal Weekly Team Focus  Bowel/Bladder     continent        Swallow/Nutrition/ Hydration             ADL's   close S - close to meeting ADL/OTgoals  supervision  ADL training, balance, cognition, safety awareness, pt/fam ed   Mobility   supervision STS, stand pivot. CGA- supervision depending on location gait of 1023 feet with no AD  supervision  balance assessment, dual-tasking, gait, stair training, car transfers   Communication   mod A phrase/sentence level  Supervision A  speech intelligibility strategies and error awareness   Safety/Cognition/ Behavioral Observations  mod A  Supervision A  attention/auditory comprehenison, error awareness, problem solving   Pain     N/a        Skin     N/a          Discharge Planning:  Home with Mom and other fmaily members should have 24/7 supervision at [place]. Doing well in his therapies   Team Discussion: Patient with impulsivity post encephalopathy and premorbid  cognitive delays with odd movements attributed to the encephalopathy, visual and perceptual deficits.  Colonoscopy; polyps noted. Hematuria- xray negative for kidney stones.  Patient on target to meet rehab goals: yes, currently needs supervision for sit- stand and ambulation up to 1000' without an assistive device. Needs moderate assist for speech intelligibility, safety, problem solving and attention.  *See Care Plan and progress notes for long and short-term goals.   Revisions to Treatment Plan:  Car transfers, stair training   Teaching Needs: Safety, medications, dietary modifications, transfers, etc  Current Barriers to Discharge: Decreased caregiver support  Possible Resolutions to Barriers: OP neuro follow up services No DME     Medical Summary Current Status: GI bleeeding,Hematuria,Anemia, HTN  Barriers to Discharge: Home enviroment access/layout;Medical stability;Behavior  Barriers to Discharge Comments: GI bleeeding,Hematuria,Anemia, HTN Possible Resolutions to [employer]: adjust BP meds, stool softeners, follow lab results   Continued Need for Acute Rehabilitation Level of Care: The patient requires daily medical management by a physician with specialized training in physical medicine and rehabilitation for the following reasons: Direction of a multidisciplinary physical rehabilitation program to maximize functional independence : Yes Medical management of patient stability for increased activity during participation in an intensive rehabilitation regime.: Yes Analysis of laboratory values and/or radiology reports with any subsequent need for medication adjustment and/or medical intervention. : Yes   I attest that I was present, lead the team conference, and concur with the  assessment and plan of the team.   Margarito Liner 03/16/2022, 4:59 PM

## 2022-03-16 NOTE — Progress Notes (Signed)
PROGRESS NOTE   Subjective/Complaints: No GI bleeding, Urine has cleared up. No new concerns.    ROS Review of Systems  Respiratory:  Negative for cough and shortness of breath.   Cardiovascular:  Negative for chest pain.  Gastrointestinal:  Negative for abdominal pain, blood in stool, constipation, diarrhea, heartburn, nausea and vomiting.  Genitourinary:  Negative for hematuria.  Neurological:  Negative for dizziness and headaches.      Objective:   DG Abd 1 View  Result Date: 03/15/2022 CLINICAL DATA:  Abdominal pain and distension with vomiting. EXAM: ABDOMEN - 1 VIEW COMPARISON:  None Available. FINDINGS: The bowel gas pattern is normal. There is a large amount of stool in the right colon. No radio-opaque calculi or other significant radiographic abnormality are seen. IMPRESSION: Negative. Electronically Signed   By: Ronney Asters M.D.   On: 03/15/2022 19:23   Recent Labs    03/13/22 2207 03/16/22 0724  WBC  --  5.9  HGB 9.5* 9.3*  HCT 30.2* 29.8*  PLT  --  318    No results for input(s): "NA", "K", "CL", "CO2", "GLUCOSE", "BUN", "CREATININE", "CALCIUM" in the last 72 hours.   Intake/Output Summary (Last 24 hours) at 03/16/2022 1033 Last data filed at 03/16/2022 0845 Gross per 24 hour  Intake 1194 ml  Output --  Net 1194 ml         Physical Exam: Vital Signs Blood pressure (!) 130/98, pulse 98, temperature 99.3 F (37.4 C), temperature source Oral, resp. rate 20, height '5\' 4"'$  (1.626 m), weight 60.1 kg, SpO2 100 %.   General: No acute distress Mood and affect are appropriate Heart: RRR. no rubs murmurs or extra sounds Lungs: Clear to auscultation, breathing unlabored, no rales or wheezes Abdomen: Positive bowel sounds, soft nontender to palpation, non-distended Extremities: No clubbing, cyanosis, or edema Skin: No evidence of breakdown, no evidence of rash Neurologic: No dizziness, or  lightheadedness following rectal bleeding today. Cranial nerves II through XII intact, motor strength is 5/5 in bilateral deltoid, bicep, tricep, grip, hip flexor, knee extensors, ankle dorsiflexor and plantar flexor Sensory exam normal sensation to light touch and proprioception in bilateral upper and lower extremities Cerebellar exam normal finger to nose to finger as well as heel to shin in bilateral upper and lower extremities Musculoskeletal: Full range of motion in all 4 extremities. No joint swelling Skin:    General: Skin is warm and dry.  Neurological:     Comments:  Follows simple commands. Dysarthria- a little improved Intact to light touch in all 4 extremities  Psychiatric:     Comments: Flat affect   Assessment/Plan: 1. Functional deficits which require 3+ hours per day of interdisciplinary therapy in a comprehensive inpatient rehab setting. Physiatrist is providing close team supervision and 24 hour management of active medical problems listed below. Physiatrist and rehab team continue to assess barriers to discharge/monitor patient progress toward functional and medical goals  Care Tool:  Bathing    Body parts bathed by patient: Right arm, Left arm, Chest, Abdomen, Front perineal area, Buttocks, Right upper leg, Left upper leg, Right lower leg, Left lower leg, Face  Bathing assist Assist Level: Minimal Assistance - Patient > 75%     Upper Body Dressing/Undressing Upper body dressing   What is the patient wearing?: Pull over shirt    Upper body assist Assist Level: Contact Guard/Touching assist    Lower Body Dressing/Undressing Lower body dressing      What is the patient wearing?: Pants, Underwear/pull up     Lower body assist Assist for lower body dressing: Minimal Assistance - Patient > 75%     Toileting Toileting    Toileting assist Assist for toileting: Minimal Assistance - Patient > 75%     Transfers Chair/bed transfer  Transfers  assist     Chair/bed transfer assist level: Supervision/Verbal cueing     Locomotion Ambulation   Ambulation assist      Assist level: Supervision/Verbal cueing Assistive device: No Device Max distance: 1,023 ft   Walk 10 feet activity   Assist     Assist level: Supervision/Verbal cueing Assistive device: No Device   Walk 50 feet activity   Assist    Assist level: Supervision/Verbal cueing Assistive device: No Device    Walk 150 feet activity   Assist Walk 150 feet activity did not occur: Safety/medical concerns  Assist level: Supervision/Verbal cueing Assistive device: No Device    Walk 10 feet on uneven surface  activity   Assist Walk 10 feet on uneven surfaces activity did not occur: Safety/medical concerns         Wheelchair     Assist Is the patient using a wheelchair?: No (pt ambulating during session)             Wheelchair 50 feet with 2 turns activity    Assist            Wheelchair 150 feet activity     Assist          Blood pressure (!) 130/98, pulse 98, temperature 99.3 F (37.4 C), temperature source Oral, resp. rate 20, height '5\' 4"'$  (1.626 m), weight 60.1 kg, SpO2 100 %.    Medical Problem List and Plan: 1. Functional deficits secondary to acute encephalopathy provoked secondary to polypharmacy/alcohol cessation.             -patient may  shower             -ELOS/Goals: 10-14 days- Supervision to min A  -CIR cont PT/OT/SLP  -Team conference today 2.  Antithrombotics: -DVT/anticoagulation:  Mechanical: Antiembolism stockings, thigh (TED hose) Bilateral lower extremities             -antiplatelet therapy: N/A 3. Pain Management: Tylenol as needed 4. Mood/Sleep: Provide emotional support             -antipsychotic agents: Zyprexa 5 mg nightly 5. Neuropsych/cognition: This patient is not capable of making decisions on his own behalf. 6. Skin/Wound Care: Routine skin checks 7.  Fluids/Electrolytes/Nutrition: Routine INO's with follow-up chemistries 8.  Seizure prophylaxis.  Depakote 500 mg twice daily.  EEG negative 9.  Hypertension.  Norvasc 5 mg daily 7/9 SBP 130's-150's primary team to consider adjustments for tighter BP control. 7/10 continue to monitor trend 7/11 Norvasc was  increased to '10mg'$  on 03/13/22, follow trend and consider additional medication 7/12 Start lisinopril '5mg'$  daily Vitals:   03/16/22 0831 03/16/22 0900  BP: (!) 142/100 (!) 130/98  Pulse: (!) 112 98  Resp: 20   Temp: 99.3 F (37.4 C)   SpO2:      10.  Asthma.  Continue Dulera as well  as nebulizer as needed.  Check oxygen saturations every shift 11.  History of alcohol tobacco abuse.  Counseling. 7/9 No withdrawal symptoms. 12.  Iron deficiency anemia.  Patient did receive 2 units packed red blood cell 03/05/2022.  Follow-up CBC stable at 8.7 on 03/11/22 7/9 Hgb dropped today to 7.9, following episode rectal bleeding.  1U PRBC's ordered, will obtain post transfusion H/H. -7/10 CBC improved to 9.5 after transfusion -7/12 HGB stable a 9.3 13.  GERD.  Protonix 14/ Low albumin  -Monitor PO intake, discussed eating foods with protein 15. Bright red blood per rectum bleeding following BM  7/9 Occurred while working with PT today, about 30-50 cc estimated to saturate a menstrual pad.  Patient denies straining or other episodes of rectal bleeding.  Denies nausea, vomiting, coffee ground emesis, dizziness, lightheadedness, or weakness.  However, became tachycardic with HR of 125. -Hgb dropped today to 7.9, following episode rectal bleeding.  -1U PRBC's ordered, will obtain post transfusion H/H. GI consulted, with pt unwilling to participate in rectal exam.  GI recommendations: Plan for colonoscopy tomorrow 7/10 for further evaluation of rectal bleeding and iron deficiency anemia with Dr. Bryan Lemma.   -Patient will be on a clear liquid diet and n.p.o. at midnight 7/10 -Patient has been ordered  MoviPrep which he will start at 3:00 in split dose fashion.  -7/10 Bleeding hemorrhoids on colonoscopy noted. Per GI conservative therapy recommended with keeping stools soft/no straining. If not improved consider colorectal surgery consult(likely as outpatient) 7/11 Docusate '100mg'$  BID, miralax prn -7/12 no further GI bleeding 15. Hematuria  -U/A completed, KUB without renal stones noted  -Resolved, consider renal hematuria if recurs    LOS: 6 days A FACE TO FACE EVALUATION WAS PERFORMED  Jennye Boroughs 03/16/2022, 10:33 AM

## 2022-03-16 NOTE — Progress Notes (Signed)
Speech Language Pathology Daily Session Note  Patient Details  Name: Timothy Hubbard MRN: [id number] Date of Birth: 1991/03/01  Today's Date: 03/16/2022 SLP Individual Time: 0900-1000 SLP Individual Time Calculation (min): 60 min  Short Term Goals: Week 1: SLP Short Term Goal 1 (Week 1): Patient will participate in further cognitive-linguistic evaluation SLP Short Term Goal 2 (Week 1): Patient will implement speech intelligibility strategies at the sentence level with min A verbal cues to achieve 80-90% intelligibility SLP Short Term Goal 3 (Week 1): Patient will monitor and self correct speech errors during 75% of opportunities with min A verbal cues SLP Short Term Goal 4 (Week 1): Pt will demonstrate awareness of deficits by anticipating various needs at home based on current limitations with min A verbal cues SLP Short Term Goal 5 (Week 1): Patient will complete mildly complex problem solving tasks with min A verbal cues  Skilled Therapeutic Interventions:Skilled ST services focused on cognitive skills. Pt was consuming breakfast and demonstrated self-feeding sustained attention to task in 5 minute intervals. Pt required max A fade to mod A verbal cues to slow rate demonstrating 40% intelligibility at phrase level. SLP facilitated sustained attention, basic problem solving, recall and error awareness in ALFA money counting task. Pt required max A fade to mod A verbal cues to count change due to reduced attention at 3 minute intervals and reduced recall even with written aids. Pt demonstrated preservation and difficulty witting during money counting tasks when asked to utilize a written aid. Pt was able to write full name with min A fade to supervision A verbal cues, and able to copy city name with mod A verbal cues for error awareness. Pt was able to write 3-4 letters words via dictation with increased awareness of errors (requiring min A verbal cues.) Pt was inconsistent in recalling previous  medication. SLP created a current medication list and will complete a pill organizer task in upcoming sessions. SLP downgraded goals to min A and added attention goal due to slow progress and reduced carryover. Education with family is needed prior to d/c. Pt was left in room with call bell within reach and bed alarm set. SLP recommends to continue skilled services.     Pain Pain Assessment Pain Score: 0-No pain  Therapy/Group: Individual Therapy  Jury Caserta  [facility] 03/16/2022, 12:40 PM

## 2022-03-16 NOTE — Progress Notes (Signed)
Patient triggered a yellow MEWs with apical pulse of 118. Charge nurse Iona Beard, RN notified and on call NP Danella Sensing notified. No new orders, will continue with yellow MEWs protocol. Patient in bed with call bell within reach and wife at bedside. Have continued with plan of care.

## 2022-03-16 NOTE — Progress Notes (Signed)
Occupational Therapy Session Note  Patient Details  Name: Timothy Hubbard MRN: [id number] Date of Birth: March 12, 1991  Today's Date: 03/16/2022 OT Individual Time: 0830-0900 OT Individual Time Calculation (min): 30 min    Short Term Goals: Week 1:  OT Short Term Goal 1 (Week 1): Pt to be CGA for functional transfers OT Short Term Goal 2 (Week 1): Pt to be CGA for LB bathing/dressiong OT Short Term Goal 3 (Week 1): Pt to demonstrate improved perception during self care with min verbal cues OT Short Term Goal 4 (Week 1): Pt to demonstrate improved safety awareness with mod verbal cues provided to reduce risk for falls OT Short Term Goal 5 (Week 1): Pt to be CGA for toileting  Skilled Therapeutic Interventions/Progress Updates:    Pt received in bed eating breakfast and taking medications. Pt has to eat slowly and take crushed meds slowly so during this time discussed discharge plans. Pt stated his main goal was to improve his speech, his intelligibility has improved from Saturday (see SLP notes) and he does best when talking more slowly. Pt has a shower seat (from his mom) and does not need further equipment.   When eating he holds his utensils with an overhand grasp. Suggested he try foam handles to help with [facility] of utensil, but pt stated "this is how I always do it".   When he is manipulating the utensil he uses a great deal of compensatory strategies of lifting his elbow and shoulder into high abduction.  Cued pt to keep arm closer to torso for improved hand control. Again pt stated "its ok, I like to hold my arm this way".  Discussed how he should have 24 hr S at home and will need help with meds. He stated his wife is home in the evenings (they have 2 small children) and his mom is home all day.    Pt stated he already got washed up last night and put fresh clothing on last night.  The 30 min session was over as he was scheduled for SLP therapy next.   Hand off to next therapist.    Therapy Documentation Precautions:  Precautions Precautions: Fall, Other (comment) Precaution Comments: seizure Restrictions Weight Bearing Restrictions: No    Vital Signs: Therapy Vitals Pulse Rate: 92 Resp: 16 Patient Position (if appropriate): Sitting Oxygen Therapy O2 Device: Room Air Pain:  No c/o pain     Therapy/Group: Individual Therapy  [place] 03/16/2022, 8:24 AM

## 2022-03-16 NOTE — Progress Notes (Signed)
Patient ID: Timothy Hubbard, male   DOB: 12-01-1990, 31 y.o.   MRN: [id number]  Met with pt to update him regarding team conference and left message for Mom to see if can schedule family education prior to discharge on Sat 7/15. Will await Mom's return call and work on discharge needs.

## 2022-03-16 NOTE — Progress Notes (Signed)
Physical Therapy Session Note  Patient Details  Name: KITAI PURDOM MRN: [id number] Date of Birth: April 08, 1991  Today's Date: 03/16/2022 PT Individual Time: 6629-4765 PT Individual Time Calculation (min): 47 min   Short Term Goals: Week 1:  PT Short Term Goal 1 (Week 1): pt will ambulate x 150 ft with CGA or better PT Short Term Goal 2 (Week 1): Pt will initiate stair training PT Short Term Goal 3 (Week 1): Pt will ambulate in distracting environment without LOB  Skilled Therapeutic Interventions/Progress Updates:      Therapy Documentation Precautions:  Precautions Precautions: Fall, Other (comment) Precaution Comments: seizure Restrictions Weight Bearing Restrictions: No    Pt received semi-reclined in bed and reports 5/10 back pain at rest. Pain increased to 6/10 following activity and pt provided with hot pack and nursing notified. Pt ambulated 327 ft supervision for balance and obstacle navigation to dayroom. PT donned harness and pt ambulated on treadmill while maintaining a speed between 1.7 - 2.0 mph. Pt requires cues to maintain wide base of support and arm swing with gait. Pt performed dual-tasking while gait training on treadmill. Cognitive tasks included counting by 1's along with reading and stating numbers on index card placed at eye level. Pt also able to engage in general conversation regarding family and hobbies during gait training. Pt gait narrows and arm swing diminishes with introduction of complex cognitive tasks. Pt ambulated from dayroom <> main gym<>ortho gym totaling 237 feet with supervision for balance. Pt navigated 8, 6 inch steps with no rails, car transfers, and ambulation on unlevel surfaces (incline and mulch) with supervision for balance. Pt performed high level gait training that focused on changes in gait speed, backward walking, heel to toe walking, and turning with CGA for balance. Pt left semi-reclined in bed with bed alarm on and all needs in reach.     Therapy/Group: Individual Therapy  Verl Dicker Verl Dicker PT, DPT  03/16/2022, 1:50 PM

## 2022-03-16 NOTE — Plan of Care (Signed)
  Problem: RH Expression Communication Goal: LTG Patient will increase speech intelligibility (SLP) Description: LTG: Patient will increase speech intelligibility at word/phrase/conversation level with cues, % of the time (SLP) Flowsheets (Taken 03/16/2022 1236) LTG: Patient will increase speech intelligibility (SLP): Minimal Assistance - Patient > 75% Level: Phrase Percent of time patient will use intelligible speech: 50% Note: Downgrade due to slow progress and limited carryover   Problem: RH Problem Solving Goal: LTG Patient will demonstrate problem solving for (SLP) Description: LTG:  Patient will demonstrate problem solving for basic/complex daily situations with cues  (SLP) Flowsheets (Taken 03/16/2022 1236) LTG: Patient will demonstrate problem solving for (SLP): Basic daily situations LTG Patient will demonstrate problem solving for: Minimal Assistance - Patient > 75% Note: Downgraded due to slow progress and reduced attention   Problem: RH Awareness Goal: LTG: Patient will demonstrate awareness during functional activites type of (SLP) Description: LTG: Patient will demonstrate awareness during functional activites type of (SLP) Flowsheets (Taken 03/16/2022 1236) LTG: Patient will demonstrate awareness during cognitive/linguistic activities with assistance of (SLP): Minimal Assistance - Patient > 75% Note: Downgraded due to slow progress and reduced carryover   Problem: RH Attention Goal: LTG Patient will demonstrate this level of attention during functional activites (SLP) Description: LTG:  Patient will will demonstrate this level of attention during functional activites (SLP) Flowsheets (Taken 03/16/2022 1238) Patient will demonstrate during cognitive/linguistic activities the attention type of: Sustained Patient will demonstrate this level of attention during cognitive/linguistic activities in: Controlled LTG: Patient will demonstrate this level of attention during  cognitive/linguistic activities with assistance of (SLP): Minimal Assistance - Patient > 75% Number of minutes patient will demonstrate attention during cognitive/linguistic activities: 5

## 2022-03-16 NOTE — Discharge Summary (Signed)
Physician Discharge Summary  Patient ID: Timothy Hubbard MRN: [id number] DOB/AGE: Aug 31, 1991 31 y.o.  Admit date: 03/10/2022 Discharge date: 03/19/2022  Discharge Diagnoses:  Principal Problem:   Encephalopathy acute Active Problems:   Acute encephalopathy   Grade III internal hemorrhoids   Hematochezia   Adenomatous polyp of descending colon Hypertension Seizure prophylaxis History of asthma Alcohol use/tobacco/marijuana use GERD  Discharged Condition: Stable  Significant Diagnostic Studies: DG Abd 1 View  Result Date: 03/15/2022 CLINICAL DATA:  Abdominal pain and distension with vomiting. EXAM: ABDOMEN - 1 VIEW COMPARISON:  None Available. FINDINGS: The bowel gas pattern is normal. There is a large amount of stool in the right colon. No radio-opaque calculi or other significant radiographic abnormality are seen. IMPRESSION: Negative. Electronically Signed   By: Ronney Asters M.D.   On: 03/15/2022 19:23   Overnight EEG with video  Result Date: 03/09/2022 Lora Havens, MD     03/09/2022 10:26 AM MRN: [id number] Epilepsy Attending: Lora Havens Referring Physician/Provider: Amie Portland, MD Duration: 03/08/2022 0919 to 03/09/2022 0919  Patient history: 31 year old man with new onset of multiple seizures. EEG to evaluate for seizure.  Level of alertness: awake, asleep  AEDs during EEG study: LEV  Technical aspects: This EEG study was done with scalp electrodes positioned according to the 10-20 International system of electrode placement. Electrical activity was acquired at a sampling rate of '500Hz'$  and reviewed with a high frequency filter of '70Hz'$  and a low frequency filter of '1Hz'$ . EEG data were recorded continuously and digitally stored.  Description: The posterior dominant rhythm consists of 8-9 Hz activity of moderate voltage (25-35 uV) seen predominantly in posterior head regions, symmetric and reactive to eye opening and eye closing. Sleep was characterized by vertex waves, sleep  spindles (12 to 14 Hz), maximal frontocentral region. EEG also showed 15 to 18 Hz beta activity distributed symmetrically and diffusely. Hyperventilation and photic stimulation were not performed.   Patient event was noted on 03/08/2022 at 0944 during which was not responding.  Another similar episode was noted at 0958. This was witnessed by psych team and described as "suddenly stopped talking to me was looking off to the L side. I kept calling his name, and tried to get his attention but he would not respond. Then suddenly he turned back to me, and I asked him where we were located and had to reorient him. Concomitant EEG before but during and after the event showed normal posterior dominant rhythm.  IMPRESSION: This study is within normal limits. No seizures or epileptiform discharges were seen throughout the recording. Two events were noted on 03/08/2022 at 0958 as described above without concomitant EEG change.  This was not an epileptic event.  Lora Havens   CT ANGIO HEAD NECK W WO CM  Result Date: 03/02/2022 CLINICAL DATA:  Vaso spasm suspected EXAM: CT ANGIOGRAPHY HEAD AND NECK TECHNIQUE: Multidetector CT imaging of the head and neck was performed using the standard protocol during bolus administration of intravenous contrast. Multiplanar CT image reconstructions and MIPs were obtained to evaluate the vascular anatomy. Carotid stenosis measurements (when applicable) are obtained utilizing NASCET criteria, using the distal internal carotid diameter as the denominator. RADIATION DOSE REDUCTION: This exam was performed according to the departmental dose-optimization program which includes automated exposure control, adjustment of the mA and/or kV according to patient size and/or use of iterative reconstruction technique. CONTRAST:  14m OMNIPAQUE IOHEXOL 300 MG/ML  SOLN COMPARISON:  Brain MRI obtained earlier the same day, noncontrast  CT head dated 1 day prior FINDINGS: CT HEAD FINDINGS Brain: There is no  acute intracranial hemorrhage, extra-axial fluid collection, or acute infarct. The ventricles are stable in size. There is no mass lesion. There is no mass effect or midline shift. Vascular: See below. Skull: Normal. Negative for fracture or focal lesion. Sinuses: The paranasal sinuses are clear. Orbits: The globes and orbits are unremarkable. Review of the MIP images confirms the above findings CTA NECK FINDINGS Aortic arch: The imaged aortic arch is normal. The origins of the major branch vessels are patent. The subclavian arteries are patent to the level imaged. Right carotid system: The right common, internal, and external carotid arteries are patent, without hemodynamically significant stenosis or occlusion. There is no dissection or aneurysm. Left carotid system: The left common, internal, and external carotid arteries are patent, without hemodynamically significant stenosis or occlusion. There is minimal plaque in the proximal left internal carotid artery. There is no dissection or aneurysm. Vertebral arteries: Vertebral arteries are patent, without hemodynamically significant stenosis or occlusion. There is no dissection or aneurysm. Skeleton: There is no acute osseous abnormality or suspicious osseous lesion. There is no visible canal hematoma. Other neck: The soft tissues of the neck are unremarkable. Upper chest: The imaged lung apices are clear. Review of the MIP images confirms the above findings CTA HEAD FINDINGS Anterior circulation: The intracranial ICAs are patent. The bilateral MCAs are patent and normal in appearance. The bilateral ACAs are patent and normal in appearance. The anterior communicating artery is normal. There is no evidence of vasospasm.  There is no aneurysm or AVM. Posterior circulation: The bilateral V4 segments are patent. The basilar artery is patent. The bilateral PCAs are patent and normal in appearance. There is no evidence of vasospasm. Small bilateral posterior  communicating arteries are identified. There is no aneurysm or AVM. Venous sinuses: As permitted by contrast timing, patent. Anatomic variants: None. Review of the MIP images confirms the above findings IMPRESSION: 1. No acute intracranial pathology. 2. Normal CTA of the head and neck. Electronically Signed   By: Valetta Mole M.D.   On: 03/02/2022 15:31   MR BRAIN W WO CONTRAST  Result Date: 03/02/2022 CLINICAL DATA:  New onset seizure EXAM: MRI HEAD WITHOUT AND WITH CONTRAST TECHNIQUE: Multiplanar, multiecho pulse sequences of the brain and surrounding structures were obtained without and with intravenous contrast. CONTRAST:  26m GADAVIST GADOBUTROL 1 MMOL/ML IV SOLN COMPARISON:  CT head dated 1 day prior FINDINGS: Brain: There is no acute intracranial hemorrhage, extra-axial fluid collection, or acute infarct. There is mild global parenchymal volume loss which appears accelerated for age. The ventricles are not enlarged. Gray-white differentiation is preserved. Parenchymal signal is normal. There is no structural or migration abnormality. The hippocampi are normal in signal and architecture There is no mass lesion. There is no abnormal enhancement. There is no mass effect or midline shift Vascular: Normal flow voids. Skull and upper cervical spine: Normal marrow signal. Sinuses/Orbits: The paranasal sinuses are clear. The globes and orbits are unremarkable. Other: None. IMPRESSION: 1. No acute intracranial pathology or epileptogenic focus identified. 2. Mild global parenchymal volume loss, accelerated for age. Electronically Signed   By: PValetta MoleM.D.   On: 03/02/2022 14:33   Overnight EEG with video  Result Date: 03/02/2022 YLora Havens MD     03/02/2022 11:54 AM MRN: [id number] Attending: PLora HavensReferring Physician/Provider: AAmie Portland MD Duration: 03/02/2022 0321 to 156 Patient history: 31year old man with new  onset of multiple seizures. EEG to evaluate for seizure.   Level of alertness: lethargic  AEDs during EEG study: LEV  Technical aspects: This EEG study was done with scalp electrodes positioned according to the 10-20 International system of electrode placement. Electrical activity was acquired at a sampling rate of '500Hz'$  and reviewed with a high frequency filter of '70Hz'$  and a low frequency filter of '1Hz'$ . EEG data were recorded continuously and digitally stored.  Description: EEG showed continuous generalized 2 to 3 Hz delta slowing admixed with an excessive amount of 15 to 18 Hz beta activity distributed symmetrically and diffusely. Hyperventilation and photic stimulation were not performed.    ABNORMALITY - Continuous slow, generalized - Excessive beta, generalized  IMPRESSION: This study is suggestive of moderate to severe diffuse encephalopathy, nonspecific etiology but likely related to sedation. No seizures or epileptiform discharges were seen throughout the recording.  Lora Havens   EEG adult  Result Date: 03/02/2022 Lora Havens, MD     03/02/2022  8:15 AM Patient Name: Timothy Hubbard MRN: [id number] Epilepsy Attending: Lora Havens Referring Physician/Provider: Amie Portland, MD Date: 03/02/2022 Duration: 28.07 mins Patient history: 32 year old man with new onset of multiple seizures. EEG to evaluate for seizure. Level of alertness: lethargic AEDs during EEG study: LEV Technical aspects: This EEG study was done with scalp electrodes positioned according to the 10-20 International system of electrode placement. Electrical activity was acquired at a sampling rate of '500Hz'$  and reviewed with a high frequency filter of '70Hz'$  and a low frequency filter of '1Hz'$ . EEG data were recorded continuously and digitally stored. Description: EEG showed continuous generalized 2 to 3 Hz delta slowing admixed with an excessive amount of 15 to 18 Hz beta activity distributed symmetrically and diffusely. Hyperventilation and photic stimulation were not performed.    ABNORMALITY - Continuous slow, generalized - Excessive beta, generalized IMPRESSION: This study is suggestive of moderate to severe diffuse encephalopathy, nonspecific etiology but likely related to sedation. No seizures or epileptiform discharges were seen throughout the recording. Lora Havens   CT Head Wo Contrast  Result Date: 03/01/2022 CLINICAL DATA:  Mental status change, unknown EXAM: CT HEAD WITHOUT CONTRAST TECHNIQUE: Contiguous axial images were obtained from the base of the skull through the vertex without intravenous contrast. RADIATION DOSE REDUCTION: This exam was performed according to the departmental dose-optimization program which includes automated exposure control, adjustment of the mA and/or kV according to patient size and/or use of iterative reconstruction technique. COMPARISON:  CT head dated Feb 01, 2013 FINDINGS: Brain: No evidence of acute infarction, hemorrhage, hydrocephalus, extra-axial collection or mass lesion/mass effect. Vascular: No hyperdense vessel or unexpected calcification. Skull: Normal. Negative for fracture or focal lesion. Sinuses/Orbits: No acute finding. Other: None. IMPRESSION: No acute intracranial abnormality. Electronically Signed   By: Keane Police D.O.   On: 03/01/2022 22:31   DG Chest Port 1 View  Result Date: 03/01/2022 CLINICAL DATA:  Questionable sepsis - evaluate for abnormality. Unresponsive EXAM: PORTABLE CHEST 1 VIEW COMPARISON:  None Available. FINDINGS: The heart size and mediastinal contours are within normal limits. Both lungs are clear. The visualized skeletal structures are unremarkable. IMPRESSION: No active disease. Electronically Signed   By: Rolm Baptise M.D.   On: 03/01/2022 22:10    Labs:  Basic Metabolic Panel: No results for input(s): "NA", "K", "CL", "CO2", "GLUCOSE", "BUN", "CREATININE", "CALCIUM", "MG", "PHOS" in the last 168 hours.   CBC: Recent Labs  Lab 03/13/22 0940 03/13/22 2207 03/16/22 3818  WBC 7.5  --  5.9   HGB 7.9* 9.5* 9.3*  HCT 26.7* 30.2* 29.8*  MCV 84.0  --  83.9  PLT 290  --  318    CBG: No results for input(s): "GLUCAP" in the last 168 hours.   Brief HPI:   Timothy Hubbard is a 31 y.o. right-handed male with history of hypertension asthma tobacco alcohol use on no prescription medications.  Per chart review lives with parent independent prior to admission.  Presented 03/01/2022 with reported multiple seizures and altered mental status.  CT/MRI showed no acute intracranial pathology.  CT angiogram head and neck negative.  Admission chemistries unremarkable except lactic acid 7.5-9.0, creatinine 1.29 troponin negative blood cultures no growth to date, WBC 12,800 urine drug screen positive benzos as well as marijuana.  Initial EEG moderate to severe diffuse encephalopathy no seizure and repeated 24-hour EEG again negative.  Lumbar puncture completed with glucose 76 total protein 6.  CSF culture no organisms seen.  VDRL nonreactive.  He was loaded with Keppra as well as Depakote since been weaned to Depakote alone.  Seizure felt to be provoked secondary to polypharmacy alcohol cessation.  Psychiatry consulted 03/08/2022 for ongoing confusion placed on Zyprexa.  Bouts of hypokalemia as well as hypomagnesemia with supplement added.  Iron deficiency anemia 6.3 he did receive 2 units packed red blood cell 03/05/2022.  Occult blood pending latest hemoglobin 9.0.  Therapy evaluations completed due to patient decreased functional mobility was admitted for a comprehensive rehab program.   Hospital Course: Timothy Hubbard was admitted to rehab 03/10/2022 for inpatient therapies to consist of PT, ST and OT at least three hours five days a week. Past admission physiatrist, therapy team and rehab RN have worked together to provide customized collaborative inpatient rehab.  Pertaining to patient's acute encephalopathy provoked secondary to polypharmacy alcohol cessation remained stable participating with therapies.  Mood  stabilization with Zyprexa follow-up psychiatry services.  Seizure prophylaxis with use of Depakote twice daily EEG negative.  Blood pressure controlled on low-dose Norvasc as well as the addition of lisinopril.  He would need outpatient follow-up.  History of asthma oxygen saturations maintained.  History of alcohol tobacco use urine drug screen positive marijuana patient did receive counts regards to cessation of these products.  Iron deficiency anemia he did receive 2 units packed red blood cell 03/05/2022 follow-up CBC 8.7.  Patient did have episode of bright red blood per rectum follow-up gastroenterology services and monitored.  Underwent colonoscopy 03/14/2022 per gastroenterology services showing hemorrhoids found on perianal exam 1 polyp removed and resected.   Blood pressures were monitored on TID basis and soft and monitored     Rehab course: During patient's stay in rehab weekly team conferences were held to monitor patient's progress, set goals and discuss barriers to discharge. At admission, patient required minimal assist 5 feet 2 person hand-held assist minimal assist sit to stand  Physical exam.  Blood pressure 143/100 pulse 86 temperature 98.4 respirations 18 oxygen saturations 100% room air Constitutional.  No acute distress HEENT Head.  Normocephalic and atraumatic Eyes.  Pupils round and reactive to light no discharge without nystagmus Neck.  Supple nontender no JVD without thyromegaly Cardiac regular rate and rhythm without extra sounds or murmur heard Abdomen.  Soft nontender positive bowel sounds without rebound Respiratory effort normal no respiratory distress without wheeze Musculoskeletal.  5 -/5 bilateral biceps triceps wrist extension grip.  5 -/5 lower extremity step left hip flexors was 4/5 Neurologic.  Alert  somewhat anxious follows commands dysarthric speech but intelligible.  He/She  has had improvement in activity tolerance, balance, postural control as well as  ability to compensate for deficits. He/She has had improvement in functional use RUE/LUE  and RLE/LLE as well as improvement in awareness.  Patient with excellent overall progress ambulates 150 feet distant supervision without assistive device.  He does demonstrate some increased fall risk which was discussed with family.  He can increase his ambulation up to 1000 feet.  Gather his belongings for activities of daily living and homemaking.  Speech therapy follow-up 60% intelligibility at sentence level demonstrating increased awareness of communication.  Full family teaching completed plan discharged home       Disposition: Discharge to home    Diet: Regular  Special Instructions: No driving smoking or alcohol  Medications at discharge. 1.  Tylenol as needed 2.  Norvasc 10 mg p.o. daily 3.  Depakote sprinkle 500 mg every 12 hours 4.  Colace 100 mg p.o. twice daily 5.  Folic acid 1 mg p.o. daily 6.  Lisinopril 5 mg p.o. daily 7.  Magnesium oxide 400 mg p.o. twice daily 8.  Dulera 2 puffs twice daily 9.  Multivitamin daily 10.  NicoDerm patch taper as directed 11.  Zyprexa 5 mg p.o. nightly 12.  Protonix 40 mg p.o. daily 13.  MiraLAX daily as needed  30-35 minutes were spent completing discharge summary and discharge planning  Discharge Instructions     Ambulatory referral to Neurology   Complete by: As directed    An appointment is requested in approximately: 4 weeks acute encephalopathy        Follow-up Information     Jennye Boroughs, MD Follow up.   Specialty: Physical Medicine and Rehabilitation Why: No formal follow up needed Contact information: [address] [place] [address] [phone]         Alcus Dad, MD Follow up.   Specialty: Family Medicine Contact information: [place] [address] [phone]                 Signed: Cathlyn Parsons 03/18/2022, 5:41 AM

## 2022-03-17 DIAGNOSIS — K921 Melena: Secondary | ICD-10-CM

## 2022-03-17 NOTE — Progress Notes (Signed)
Physical Therapy Session Note  Patient Details  Name: Timothy Hubbard MRN: [id number] Date of Birth: 08/22/1991  Today's Date: 03/17/2022 PT Individual Time: 4401-0272 PT Individual Time Calculation (min): 39 min  and Today's Date: 03/17/2022 PT Missed Time: 21 Minutes Missed Time Reason: Other (Comment) (declined due to eating)  Short Term Goals: Week 1:  PT Short Term Goal 1 (Week 1): pt will ambulate x 150 ft with CGA or better PT Short Term Goal 2 (Week 1): Pt will initiate stair training PT Short Term Goal 3 (Week 1): Pt will ambulate in distracting environment without LOB  Skilled Therapeutic Interventions/Progress Updates:      Therapy Documentation Precautions:  Precautions Precautions: Fall, Other (comment) Precaution Comments: seizure Restrictions Weight Bearing Restrictions: No  Pt received seated edge of bed eating lunch, pt deferred physical therapy session until he completed eating. PT returned and pt agreeable to physical therapy. Pt reports 7/10 back pain and provided with heat pack following session. Pt supervision for balance with sit to stand and ambulation from hospital room to day room with no AD. Pt requires CGA to close supervision with dynamic balance activites. Pt performed forward and lateral drills on agility ladder and required verbal and visual cues for instructions. Pt ambulated supervision for balance from dayroom to main gym and participated in cone and 6 inch step taps in sagittal plane and required close supervision for balance. Pt performed lateral step up and over 6 inch step and required CGA for balance and  verbal/visual cues for sequencing. Pt requires CGA for balance with varied base of support (normal, narrow, semi-tandem) while throwing/catching ball to rebounder with bilateral UE. Pt ambulated from main gym to hospital room with supervision for safety and balance and left seated edge of bed with bed alarm on and all needs in reach.   Therapy/Group:  Individual Therapy  Verl Dicker Verl Dicker PT, DPT  03/17/2022, 7:47 AM

## 2022-03-17 NOTE — Progress Notes (Signed)
PROGRESS NOTE   Subjective/Complaints: No further GI or GU bleeding. Reports BM yesterday was soft. No dysuria.    ROS Review of Systems  Respiratory:  Negative for cough and shortness of breath.   Cardiovascular:  Negative for chest pain.  Gastrointestinal:  Negative for abdominal pain, blood in stool, constipation, diarrhea, heartburn, nausea and vomiting.  Genitourinary:  Negative for dysuria and hematuria.  Neurological:  Negative for dizziness and headaches.      Objective:   DG Abd 1 View  Result Date: 03/15/2022 CLINICAL DATA:  Abdominal pain and distension with vomiting. EXAM: ABDOMEN - 1 VIEW COMPARISON:  None Available. FINDINGS: The bowel gas pattern is normal. There is a large amount of stool in the right colon. No radio-opaque calculi or other significant radiographic abnormality are seen. IMPRESSION: Negative. Electronically Signed   By: Timothy Hubbard M.D.   On: 03/15/2022 19:23   Recent Labs    03/16/22 0724  WBC 5.9  HGB 9.3*  HCT 29.8*  PLT 318    No results for input(s): "NA", "K", "CL", "CO2", "GLUCOSE", "BUN", "CREATININE", "CALCIUM" in the last 72 hours.   Intake/Output Summary (Last 24 hours) at 03/17/2022 0756 Last data filed at 03/16/2022 1857 Gross per 24 hour  Intake 948 ml  Output --  Net 948 ml         Physical Exam: Vital Signs Blood pressure (!) 142/96, pulse (!) 107, temperature 98 F (36.7 C), temperature source Oral, resp. rate 16, height '5\' 4"'$  (1.626 m), weight 60.1 kg, SpO2 100 %.   General: No acute distress Mood and affect are appropriate Heart: RRR. no rubs murmurs or extra sounds Lungs: Clear to auscultation, breathing unlabored, no rales or wheezes Abdomen: Positive bowel sounds, soft nontender to palpation, non-distended Extremities: No clubbing, cyanosis, or edema Skin: No evidence of breakdown, no evidence of rash Neurologic: No dizziness, or  lightheadedness Cranial nerves II through XII intact, motor strength is 5/5 in bilateral deltoid, bicep, tricep, grip, hip flexor, knee extensors, ankle dorsiflexor and plantar flexor Sensory exam normal sensation to light touch and proprioception in bilateral upper and lower extremities Cerebellar exam normal finger to nose to finger as well as heel to shin in bilateral upper and lower extremities Musculoskeletal: Full range of motion in all 4 extremities. No joint swelling Skin:    General: Skin is warm and dry.  Neurological:     Comments:  Follows simple commands. Dysarthria- a little improved Intact to light touch in all 4 extremities  Psychiatric:     Comments: normal affect  Assessment/Plan: 1. Functional deficits which require 3+ hours per day of interdisciplinary therapy in a comprehensive inpatient rehab setting. Physiatrist is providing close team supervision and 24 hour management of active medical problems listed below. Physiatrist and rehab team continue to assess barriers to discharge/monitor patient progress toward functional and medical goals  Care Tool:  Bathing    Body parts bathed by patient: Right arm, Left arm, Chest, Abdomen, Front perineal area, Buttocks, Right upper leg, Left upper leg, Right lower leg, Left lower leg, Face         Bathing assist Assist Level: Minimal Assistance - Patient >  75%     Upper Body Dressing/Undressing Upper body dressing   What is the patient wearing?: Pull over shirt    Upper body assist Assist Level: Contact Guard/Touching assist    Lower Body Dressing/Undressing Lower body dressing      What is the patient wearing?: Pants, Underwear/pull up     Lower body assist Assist for lower body dressing: Minimal Assistance - Patient > 75%     Toileting Toileting    Toileting assist Assist for toileting: Minimal Assistance - Patient > 75%     Transfers Chair/bed transfer  Transfers assist     Chair/bed transfer  assist level: Supervision/Verbal cueing     Locomotion Ambulation   Ambulation assist      Assist level: Supervision/Verbal cueing Assistive device: No Device Max distance: 1,023 ft   Walk 10 feet activity   Assist     Assist level: Supervision/Verbal cueing Assistive device: No Device   Walk 50 feet activity   Assist    Assist level: Supervision/Verbal cueing Assistive device: No Device    Walk 150 feet activity   Assist Walk 150 feet activity did not occur: Safety/medical concerns  Assist level: Supervision/Verbal cueing Assistive device: No Device    Walk 10 feet on uneven surface  activity   Assist Walk 10 feet on uneven surfaces activity did not occur: Safety/medical concerns   Assist level: Supervision/Verbal cueing Assistive device: Other (comment) (No AD)   Wheelchair     Assist Is the patient using a wheelchair?: No (pt ambulating during session)             Wheelchair 50 feet with 2 turns activity    Assist            Wheelchair 150 feet activity     Assist          Blood pressure (!) 142/96, pulse (!) 107, temperature 98 F (36.7 C), temperature source Oral, resp. rate 16, height '5\' 4"'$  (1.626 m), weight 60.1 kg, SpO2 100 %.    Medical Problem List and Plan: 1. Functional deficits secondary to acute encephalopathy provoked secondary to polypharmacy/alcohol cessation.             -patient may  shower             -ELOS/Goals: 7/15- Supervision  -CIR cont PT/OT/SLP 2.  Antithrombotics: -DVT/anticoagulation:  Mechanical: Antiembolism stockings, thigh (TED hose) Bilateral lower extremities             -antiplatelet therapy: N/A 3. Pain Management: Tylenol as needed 4. Mood/Sleep: Provide emotional support             -antipsychotic agents: Zyprexa 5 mg nightly 5. Neuropsych/cognition: This patient is not capable of making decisions on his own behalf. 6. Skin/Wound Care: Routine skin checks 7.  Fluids/Electrolytes/Nutrition: Routine INO's with follow-up chemistries 8.  Seizure prophylaxis.  Depakote 500 mg twice daily.  EEG negative 9.  Hypertension.  Norvasc 5 mg daily 7/9 SBP 130's-150's primary team to consider adjustments for tighter BP control. 7/10 continue to monitor trend 7/11 Norvasc was  increased to '10mg'$  on 03/13/22, follow trend and consider additional medication 7/12 Start lisinopril '5mg'$  daily 7/13 continue to monitor trend, a little improved Vitals:   03/16/22 2157 03/17/22 0700  BP: (!) 146/98 (!) 142/96  Pulse: (!) 105 (!) 107  Resp: 16 16  Temp: 98.4 F (36.9 C) 98 F (36.7 C)  SpO2: 100% 100%    10.  Asthma.  Continue Ruthe Mannan  as well as nebulizer as needed.  Check oxygen saturations every shift 11.  History of alcohol tobacco abuse.  Counseling. 7/9 No withdrawal symptoms. 12.  Iron deficiency anemia.  Patient did receive 2 units packed red blood cell 03/05/2022.  Follow-up CBC stable at 8.7 on 03/11/22 7/9 Hgb dropped today to 7.9, following episode rectal bleeding.  1U PRBC's ordered, will obtain post transfusion H/H. -7/10 CBC improved to 9.5 after transfusion -7/12 HGB stable a 9.3 13.  GERD.  Protonix 14/ Low albumin  -Monitor PO intake, discussed eating foods with protein 15. Bright red blood per rectum bleeding following BM  7/9 Occurred while working with PT today, about 30-50 cc estimated to saturate a menstrual pad.  Patient denies straining or other episodes of rectal bleeding.  Denies nausea, vomiting, coffee ground emesis, dizziness, lightheadedness, or weakness.  However, became tachycardic with HR of 125. -Hgb dropped today to 7.9, following episode rectal bleeding.  -1U PRBC's ordered, will obtain post transfusion H/H. GI consulted, with pt unwilling to participate in rectal exam.  GI recommendations: Plan for colonoscopy tomorrow 7/10 for further evaluation of rectal bleeding and iron deficiency anemia with Dr. Bryan Lemma.   -Patient will be on  a clear liquid diet and n.p.o. at midnight 7/10 -Patient has been ordered MoviPrep which he will start at 3:00 in split dose fashion.  -7/10 Bleeding hemorrhoids on colonoscopy noted. Per GI conservative therapy recommended with keeping stools soft/no straining. If not improved consider colorectal surgery consult(likely as outpatient) 7/11 Docusate '100mg'$  BID, miralax prn -7/13 no further GI bleeding, stool soft per his report 15. Hematuria  -U/A completed, KUB without renal stones noted  -Resolved, consider renal hematuria if recurs   -7/14 no further hematuria   LOS: 7 days A FACE TO FACE EVALUATION WAS PERFORMED  Timothy Hubbard 03/17/2022, 7:56 AM

## 2022-03-17 NOTE — Progress Notes (Signed)
Speech Language Pathology Daily Session Note  Patient Details  Name: Timothy Hubbard MRN: [id number] Date of Birth: 12-Jul-1991  Today's Date: 03/17/2022 SLP Individual Time: 0915-1000 SLP Individual Time Calculation (min): 45 min  Short Term Goals: Week 1: SLP Short Term Goal 1 (Week 1): Patient will participate in further cognitive-linguistic evaluation SLP Short Term Goal 2 (Week 1): Patient will implement speech intelligibility strategies at the sentence level with min A verbal cues to achieve 80-90% intelligibility SLP Short Term Goal 3 (Week 1): Patient will monitor and self correct speech errors during 75% of opportunities with min A verbal cues SLP Short Term Goal 4 (Week 1): Pt will demonstrate awareness of deficits by anticipating various needs at home based on current limitations with min A verbal cues SLP Short Term Goal 5 (Week 1): Patient will complete mildly complex problem solving tasks with min A verbal cues  Skilled Therapeutic Interventions: Skilled treatment session focused on cognitive and communication goals. Upon arrival, patient was awake in bed. Patient unaware of therapy schedule as he reported that he was going home today. SLP provided education regarding current discharge date and plan for family education prior to discharge. CSW also reinforced information. Patient called his mother to update her and SLP scheduled family education for tomorrow starting at 55. Team made aware. SLP facilitated session by providing Min verbal cues for functional problem solving while organizing a BID pill box. Patient initially demonstrated difficulty distinguishing between am/pm sides of the pill box but able to carryover information throughout remainder of task. Patient left upright in bed with alarm on and all needs within reach. Continue with current plan of care.      Pain No/Denies Pain   Therapy/Group: Individual Therapy  Liz Pinho 03/17/2022, 1:12 PM

## 2022-03-17 NOTE — Progress Notes (Addendum)
Occupational Therapy Session Note  Patient Details  Name: Timothy Hubbard MRN: [id number] Date of Birth: [birth year]  Today's Date: 03/17/2022 OT Individual Time: 3267-1245 & 1418 - 1521 OT Individual Time Calculation (min): 53 min & 63 min   Short Term Goals: Week 1:  OT Short Term Goal 1 (Week 1): Pt to be CGA for functional transfers OT Short Term Goal 2 (Week 1): Pt to be CGA for LB bathing/dressiong OT Short Term Goal 3 (Week 1): Pt to demonstrate improved perception during self care with min verbal cues OT Short Term Goal 4 (Week 1): Pt to demonstrate improved safety awareness with mod verbal cues provided to reduce risk for falls OT Short Term Goal 5 (Week 1): Pt to be CGA for toileting  First Session -- Skilled Therapeutic Interventions/Progress Updates:  Pt awake in bed upon OT arrival to the room. Pt reports, "I'm going home on the 15th." Pt in agreement for OT session.   Therapy Documentation Precautions:  Precautions Precautions: Fall, Other (comment) Precaution Comments: seizure Restrictions Weight Bearing Restrictions: No Vital Signs (most recent vitals charted by nursing): Therapy Vitals Temp: 98 F (36.7 C) Temp Source: Oral Pulse Rate: (!) 107 Resp: 16 BP: (!) 142/96 Patient Position (if appropriate): Lying Oxygen Therapy SpO2: 97 % O2 Device: Room Air Pain: Pain Assessment Pain Scale: 0-10 Pain Score: 4  Pain Location: Back Pain Orientation: Lower Pain Descriptors / Indicators: Aching Pain Intervention(s): Distraction;Emotional support Provided a heat pack at end of session to assist with pain management, per pt request.   ADL: Grooming: Supervision/safety (Pt able to complete oral care with distant supervision standing at the sink with no AD.) Where Assessed-Grooming: Standing at sink Upper Body Dressing: Supervision/safety (Pt able to complete clothing retrieval and doff/don a pull-over style shirt with distant supervision while standing at the  sink.) Where Assessed-Upper Body Dressing: Chair Lower Body Dressing: Supervision/safety (Pt able to don shorts while seated at EOB and standing with no AD to pull up with distant supervision for safety.) Where Assessed-Lower Body Dressing: Edge of bed Toileting:  (Offered assistance however pt declines need at this time.) ADL Comments: Pt participates in modified ADL routine with distant supervision while in room. Pt participates in clothing and item retrieval in hallway to get wash cloths for morning routine with close supervision for safety. Pt able to ambulate around room, pick items up off the floor, put items away in closet, and don shoes with distant supervision and no LOB noted.  Functional Mobility/Endurance: Pt participates in functional mobility and environmental navigation task to a new location environment as part of pre-community mobility tasks. Pt participates in an environmental navigation to navigate back to a new location off the floor [employer]) and use environmental cues to navigate. Pt able to complete functional mobility with close supervision while ambulating at a quicker pace and requires minimal verbal prompts to navigate to the familiar location, perform proper sequencing when ordering food, maintaining safety, and safely navigating in a busy environment. Due to requiring minimal prompting to safely navigate, recommend close supervision for community mobility tasks at this time to maintain safety and ensure proper navigation within the community after DC. Pt ale to hold to-go boxes with BUE, manage elevator buttons, and maintain quick pace with no LOB noted.   Pt returned to EOB at end of session. Pt left resting comfortably seated at EOB to eat food purchased from cafeteria with personal belongings and call light within reach, bed alarm on and activated, bed  in low position, 3 bed rails up, and comfort needs attended to.   Second Session -- Skilled Therapeutic  Interventions/Progress Updates:  Pt awake in bed upon OT arrival to the room. Pt reports, "I know I'm not ready to do that much yet (work)." Pt in agreement for OT session.  Pain:  03/17/22 1418  Pain Assessment  Pain Scale 0-10  Pain Score 6  Pain Location Back  Pain Orientation Mid  Pain Descriptors / Indicators Aching  Pain Onset On-going  Pain Intervention(s) Emotional support;Distraction    Therapeutic Activity: Pt participates in various therapeutic activities in order to improve functional endurance, job readiness, and safety to prepare for DC home. Pt participates in functional mobility to ambulate around the rehab unit with close - distant supervision with no LOB. Pt did accidentally bump R shoulder into wall 1x, however, able to recover without physical assistance. Pt ambulated while carrying a mat that weighs ~10# to transport a mat from one therapy gym to another in order to use for an activity. Pt able to transport this with close supervision and no LOB. Pt able to perform floor <> standing transfer with close supervision - minimal assistance when pt grabs a rolling object to assist with force production. Education provided to pt on how this is unsafe and can increase fall risks. Encouraged pt to use a stable surface to push up if needing assistance during transition. Pt able to maintain quadruped positioning for an overall total of 3-4 minutes with close supervision and participate in "bird dog" exercise for approx 10 reps each side with moderate - maximal assistance to maintain proper positioning while completing this contralateral/bilateral coordination exercise. Pt able able to participate in cleaning mat while in quadruped position with close supervision. Pt able to fold mat up and transport back to gym with close supervision. Pt then participates in pre-work task to simulate mopping. Performed this movement on the carpet to increase friction to simulate to real task demands. Pt  able to perform approx 2 minutes of mopping motions with close supervision for safety. Pt able to perform sit <> stand from recliner with distant supervision and returned to room with close - distant supervision for safety.   Pt requested to sit in recliner at end of session. Pt left sitting comfortably in the recliner with personal belongings and call light within reach, belt alarm placed and activated, and comfort needs attended to.    Therapy/Group: Individual Therapy  [name] 03/17/2022, 10:55 AM

## 2022-03-17 NOTE — Progress Notes (Signed)
Physical Therapy Discharge Summary  Patient Details  Name: Timothy Hubbard MRN: [id number] Date of Birth: Nov 13, 1990     Patient has met 4 of 4 long term goals due to improved activity tolerance, improved balance, improved postural control, increased strength, and improved coordination.  Patient to discharge at an ambulatory level Supervision without an AD.  Pt family participated in education with physical therapy and had no questions regarding DC plan or safety at the house.  All Goals Met   Recommendation:  Patient will benefit from ongoing skilled PT services in outpatient neuro setting to continue to advance safe functional mobility, address ongoing impairments in balance and coordination, and minimize fall risk.  Equipment: No equipment provided  Reasons for discharge: treatment goals met and discharge from hospital  Patient/family agrees with progress made and goals achieved: Yes  PT Discharge  Pain Interference Pain Interference Pain Effect on Sleep: 2. Occasionally Pain Interference with Therapy Activities: 1. Rarely or not at all Pain Interference with Day-to-Day Activities: 1. Rarely or not at all Cognition Overall Cognitive Status: History of cognitive impairments - at baseline Arousal/Alertness: Awake/alert Orientation Level: Oriented X4 Memory: Impaired Awareness: Impaired Problem Solving: Impaired Safety/Judgment: Impaired Sensation Sensation Light Touch: Appears Intact Proprioception: Appears Intact Additional Comments: intact bilaterally Coordination Gross Motor Movements are Fluid and Coordinated: No Fine Motor Movements are Fluid and Coordinated: No Coordination and Movement Description: impaired balance and coordination Finger Nose Finger Test: impaired bilaterally Heel Shin Test: impaired bilaterally Motor  Motor Motor: Other (comment) (grossly uncoordinated due to balance deficits) Motor - Skilled Clinical Observations: grossly uncoordinated due to  balance deficits  Mobility Bed Mobility Bed Mobility: Rolling Left;Sit to Supine;Supine to Sit Rolling Left: Supervision/Verbal cueing Supine to Sit: Supervision/Verbal cueing Sit to Supine: Supervision/Verbal cueing Transfers Sit to Stand: Supervision/Verbal cueing Stand to Sit: Supervision/Verbal cueing Stand Pivot Transfers: Supervision/Verbal cueing Stand Pivot Transfer Details (indicate cue type and reason): verbal cues for balance Transfer (Assistive device): None Locomotion  Gait Ambulation: Yes Gait Assistance: Supervision/Verbal cueing Gait Distance (Feet): 1023 Feet Assistive device: None Gait Assistance Details: Verbal cues for precautions/safety Gait Assistance Details: verbal cues for balance and safety Gait Gait: Yes Gait Pattern: Impaired Gait Pattern:  (grossly impaired due to balance deficits) Gait velocity: decreased speed Stairs / Additional Locomotion Stairs: Yes Stairs Assistance: Supervision/Verbal cueing Stair Management Technique: No rails Number of Stairs: 8 Ramp: Supervision/Verbal cueing Curb: Supervision/Verbal cueing Wheelchair Mobility Wheelchair Mobility: No  Trunk/Postural Assessment  Cervical Assessment Cervical Assessment: Within Functional Limits Thoracic Assessment Thoracic Assessment: Within Functional Limits Lumbar Assessment Lumbar Assessment: Within Functional Limits Postural Control Postural Control: Within Functional Limits  Balance Balance Balance Assessed: Yes Standardized Balance Assessment Standardized Balance Assessment: Berg Balance Test;Functional Gait Assessment Berg Balance Test Sit to Stand: Able to stand without using hands and stabilize independently Standing Unsupported: Able to stand safely 2 minutes Sitting with Back Unsupported but Feet Supported on Floor or Stool: Able to sit safely and securely 2 minutes Stand to Sit: Sits safely with minimal use of hands Transfers: Able to transfer safely, minor use of  hands Standing Unsupported with Eyes Closed: Able to stand 10 seconds safely Standing Ubsupported with Feet Together: Able to place feet together independently and stand 1 minute safely From Standing, Reach Forward with Outstretched Arm: Can reach confidently >25 cm (10") From Standing Position, Pick up Object from Floor: Able to pick up shoe safely and easily From Standing Position, Turn to Look Behind Over each Shoulder: Looks behind from both  sides and weight shifts well Turn 360 Degrees: Able to turn 360 degrees safely in 4 seconds or less Standing Unsupported, Alternately Place Feet on Step/Stool: Able to stand independently and safely and complete 8 steps in 20 seconds Standing Unsupported, One Foot in Front: Able to place foot tandem independently and hold 30 seconds Standing on One Leg: Tries to lift leg/unable to hold 3 seconds but remains standing independently Total Score: 53 Dynamic Gait Index Level Surface: Normal Change in Gait Speed: Mild Impairment Gait with Horizontal Head Turns: Mild Impairment Gait with Vertical Head Turns: Mild Impairment Gait and Pivot Turn: Mild Impairment Step Over Obstacle: Mild Impairment Step Around Obstacles: Normal Steps: Mild Impairment Total Score: 18 Timed Up and Go Test TUG: Normal TUG Normal TUG (seconds): 8.05 Static Sitting Balance Static Sitting - Balance Support: Feet supported Static Sitting - Level of Assistance: 5: Stand by assistance (supervision) Dynamic Sitting Balance Dynamic Sitting - Balance Support: Feet supported Dynamic Sitting - Level of Assistance: 5: Stand by assistance (supervision) Dynamic Sitting - Balance Activities: Reaching across midline;Reaching for objects Static Standing Balance Static Standing - Balance Support: No upper extremity supported Static Standing - Level of Assistance: 5: Stand by assistance (supervision) Dynamic Standing Balance Dynamic Standing - Balance Support: During functional  activity;No upper extremity supported Dynamic Standing - Level of Assistance: 5: Stand by assistance (supervision) Dynamic Standing - Balance Activities: Reaching for objects;Ball toss Functional Gait  Assessment Gait assessed : Yes Gait Level Surface: Walks 20 ft in less than 5.5 sec, no assistive devices, good speed, no evidence for imbalance, normal gait pattern, deviates no more than 6 in outside of the 12 in walkway width. Change in Gait Speed: Able to smoothly change walking speed without loss of balance or gait deviation. Deviate no more than 6 in outside of the 12 in walkway width. Gait with Horizontal Head Turns: Performs head turns smoothly with no change in gait. Deviates no more than 6 in outside 12 in walkway width Gait with Vertical Head Turns: Performs head turns with no change in gait. Deviates no more than 6 in outside 12 in walkway width. Gait and Pivot Turn: Pivot turns safely within 3 sec and stops quickly with no loss of balance. Step Over Obstacle: Is able to step over 2 stacked shoe boxes taped together (9 in total height) without changing gait speed. No evidence of imbalance. Gait with Narrow Base of Support: Ambulates 4-7 steps. Gait with Eyes Closed: Walks 20 ft, no assistive devices, good speed, no evidence of imbalance, normal gait pattern, deviates no more than 6 in outside 12 in walkway width. Ambulates 20 ft in less than 7 sec. Ambulating Backwards: Walks 20 ft, no assistive devices, good speed, no evidence for imbalance, normal gait Steps: Alternating feet, must use rail. Total Score: 27 Extremity Assessment  RLE Assessment RLE Assessment: Within Functional Limits General Strength Comments: Grossly 5/5 LLE Assessment LLE Assessment: Within Functional Limits General Strength Comments: Grossly 5/5    Verl Dicker Verl Dicker PT, DPT  03/17/2022, 4:25 PM

## 2022-03-17 NOTE — Progress Notes (Addendum)
Patient ID: Timothy Hubbard, male   DOB: 08/09/1991, 31 y.o.   MRN: [id number]  Met with pt along with Courtney-SP due to pt was told he was going home today by someone. Corrected him on discharge Sat 7/15. Have yet to hear from Mom regarding family education. Pt attempted to call her while all of us ws in his room on speaker and it went to voice mail. He is fine with discharge date and will do therapies today  10:35 AM Pt agreeable to OP rehab for therapies. Will fax referral and ask to contact Mom to set up appointments  11:27 Am Courtney-SP spoke with mom and scheduled family education for tomorrow at 9:00. Erin-scheduler aware of this 

## 2022-03-18 ENCOUNTER — Other Ambulatory Visit (HOSPITAL_COMMUNITY): Payer: Self-pay

## 2022-03-18 ENCOUNTER — Telehealth (HOSPITAL_COMMUNITY): Payer: Self-pay | Admitting: Pharmacy Technician

## 2022-03-18 MED ORDER — AMLODIPINE BESYLATE 10 MG PO TABS
10.0000 mg | ORAL_TABLET | Freq: Every day | ORAL | 0 refills | Status: DC
  Filled 2022-03-18: qty 30, 30d supply, fill #0

## 2022-03-18 MED ORDER — DIVALPROEX SODIUM 500 MG PO DR TAB
500.0000 mg | DELAYED_RELEASE_TABLET | Freq: Two times a day (BID) | ORAL | 0 refills | Status: DC
  Filled 2022-03-18: qty 60, 30d supply, fill #0

## 2022-03-18 MED ORDER — PSYLLIUM 95 % PO PACK
1.0000 | PACK | Freq: Every day | ORAL | Status: DC
  Filled 2022-03-18 (×2): qty 1

## 2022-03-18 MED ORDER — LISINOPRIL 5 MG PO TABS
5.0000 mg | ORAL_TABLET | Freq: Every day | ORAL | 0 refills | Status: DC
  Filled 2022-03-18: qty 30, 30d supply, fill #0

## 2022-03-18 MED ORDER — FOLIC ACID 1 MG PO TABS
1.0000 mg | ORAL_TABLET | Freq: Every day | ORAL | 0 refills | Status: DC
  Filled 2022-03-18: qty 30, 30d supply, fill #0

## 2022-03-18 MED ORDER — OLANZAPINE 5 MG PO TABS
5.0000 mg | ORAL_TABLET | Freq: Every day | ORAL | 0 refills | Status: DC
  Filled 2022-03-18: qty 30, 30d supply, fill #0

## 2022-03-18 MED ORDER — MAGNESIUM OXIDE 400 MG PO TABS
400.0000 mg | ORAL_TABLET | Freq: Two times a day (BID) | ORAL | 0 refills | Status: DC
  Filled 2022-03-18: qty 60, 30d supply, fill #0

## 2022-03-18 MED ORDER — PANTOPRAZOLE SODIUM 40 MG PO TBEC
40.0000 mg | DELAYED_RELEASE_TABLET | Freq: Every day | ORAL | Status: DC
  Administered 2022-03-18 – 2022-03-19 (×2): 40 mg via ORAL
  Filled 2022-03-18 (×2): qty 1

## 2022-03-18 MED ORDER — NICOTINE 21 MG/24HR TD PT24
MEDICATED_PATCH | TRANSDERMAL | 0 refills | Status: DC
  Filled 2022-03-18: qty 28, 14d supply, fill #0

## 2022-03-18 MED ORDER — MOMETASONE FURO-FORMOTEROL FUM 100-5 MCG/ACT IN AERO
2.0000 | INHALATION_SPRAY | Freq: Two times a day (BID) | RESPIRATORY_TRACT | 0 refills | Status: DC
  Filled 2022-03-18: qty 13, 30d supply, fill #0

## 2022-03-18 MED ORDER — PANTOPRAZOLE SODIUM 40 MG PO TBEC
40.0000 mg | DELAYED_RELEASE_TABLET | Freq: Every day | ORAL | 0 refills | Status: DC
  Filled 2022-03-18: qty 30, 30d supply, fill #0

## 2022-03-18 NOTE — Progress Notes (Signed)
Inpatient Rehabilitation Care Coordinator Discharge Note DC SAT 7/15  Patient Details  Name: Timothy Hubbard MRN: [id number] Date of Birth: 1991/05/31   Discharge location: [place]  Length of Stay: 9 DAYS  Discharge activity level: SUPERVISION LEVEL DUE TO COGNITIVE ISSUES  Home/community participation: ACTIVE  Patient response DJ:TTSVXB Literacy - How often do you need to have someone help you when you read instructions, pamphlets, or other written material from your doctor or pharmacy?: Sometimes  Patient response LT:JQZESP Isolation - How often do you feel lonely or isolated from those around you?: Never  Services provided included: MD, RD, PT, OT, SLP, RN, CM, TR, Pharmacy, SW  Financial Services:  Financial Services Utilized: Medicaid    Choices offered to/list presented to: PT AND MOM  Follow-up services arranged:  Outpatient    Outpatient Servicies: [facility] & SP WILL CALL MOM TO SET UP FOLLOW UP APPOINTMENTS      Patient response to transportation need: Is the patient able to respond to transportation needs?: Yes In the past 12 months, has lack of transportation kept you from medical appointments or from getting medications?: No In the past 12 months, has lack of transportation kept you from meetings, work, or from getting things needed for daily living?: No    Comments (or additional information):MOM WAS HERE ON [place]. BOTH COMFORTABLE WITH DC HOME  Patient/Family verbalized understanding of follow-up arrangements:  Yes  Individual responsible for coordination of the follow-up plan: Renford Dills [phone]  Confirmed correct DME delivered: Elease Hashimoto 03/18/2022    Vaidehi Braddy, Gardiner Rhyme

## 2022-03-18 NOTE — Progress Notes (Signed)
Speech Language Pathology Discharge Summary  Patient Details  Name: Timothy Hubbard MRN: [id number] Date of Birth: [birth year]  Today's Date: 03/18/2022 SLP Individual Time: 3267-1245 SLP Individual Time Calculation (min): 44 min   Skilled Therapeutic Interventions:  Pt seen for skilled ST with focus on cognitive and speech goals, pt sitting EOB awaiting arrival of family for formal education. Pt mother, father and sister eventually present for education, explained role of SLP, pt progress toward goals and remaining deficits in cognition and speech. Family educated on pt impairments in attention, memory, safety and problem solving at this time and recommendations for 24/7 supervision and assist with higher level cognitive tasks. Discussed patient's impulsivity and need for verbal cues, structure and redirection during daily living tasks to increase safety and independence. Pt's mother reports pt will assist with medication compliance and they plan to provide assistance with med management/organization. Discussed patient's mild increased awareness and self-correction of impaired speech intelligibility/communication breakdowns vs initial evaluation, however continue speech intelligibility compensatory strategies at home ("A BOSS"). Pt completing simple money counting task requiring mod A cues for accuracy, mother and sister providing appropriate cues for attention during task. All questions answered to satisfaction and pt left sitting EOB with family present for needs.  Patient has met 4 of 4 long term goals.  Patient to discharge at [facility] level.   Clinical Impression/Discharge Summary:   Pt has met 4 out of 4 long term goals during CIR rehab stay and is discharging at an overall min A level for cognition and expressive/motor speech. Goals during stay were downgraded to Min A due to slow progress. Pt's mother, father and sister present for formal education on last treatment day, educated extensively on  recommendations for 24/7 supervision, assist with higher level cognitive tasks (medication management, money management, scheduling, etc.), medication compliance, safety precautions, attention strategies and speech intelligibility strategies. Pt and family educated on healthy lifestyle habits to promote cognitive improvements at discharge and safety with daily living tasks. Family verbalized understanding. Recommend OPST services for cognition and speech.   Care Partner:  Caregiver Able to Provide Assistance: Yes  Type of Caregiver Assistance: Physical;Cognitive  Recommendation:  Outpatient SLP  Rationale for SLP Follow Up: Maximize functional communication;Maximize cognitive function and independence;Reduce caregiver burden   Reasons for discharge: Treatment goals met;Discharged from hospital   Patient/Family Agrees with Progress Made and Goals Achieved: Yes    Dewaine Conger 03/18/2022, 9:43 AM

## 2022-03-18 NOTE — Progress Notes (Signed)
Physical Therapy Session Note  Patient Details  Name: Timothy Hubbard MRN: [id number] Date of Birth: Jun 20, 1991  Today's Date: 03/18/2022 PT Individual Time: 1419-1500 PT Individual Time Calculation (min): 41 min   Today's Date: 03/18/2022 PT Missed Time: 19 Minutes Missed Time Reason: Other (Comment) (eating lunch)  Short Term Goals: Week 1:  PT Short Term Goal 1 (Week 1): pt will ambulate x 150 ft with CGA or better PT Short Term Goal 2 (Week 1): Pt will initiate stair training PT Short Term Goal 3 (Week 1): Pt will ambulate in distracting environment without LOB  Skilled Therapeutic Interventions/Progress Updates:      Therapy Documentation Precautions:  Precautions Precautions: Fall, Other (comment) Precaution Comments: seizure Restrictions Weight Bearing Restrictions: No  Pt received seated edge of bed eating lunch and missed 19 minutes of skilled therapy. PT returned and pt agreeable to PT session and pt reports 5-6/10 back pain at rest and pain addressed via hot pack. Pt requires supervision for sit to stand and ambulation within room for balance. Pt requires supervision for standing dynamic balance activities as he organized and cleaned hospital room. Pt ambulated to dayroom and participated in static and dynamic balance activities where he required supervision for balance and safety while participating in Wii golf. Pt displayed difficulty with following commands and multi-step instructions for game and required visual and verbal cues. Pt ambulated to hospital room and retrieved leftover lunch. Pt requires supervision for ambulation with lunch to microwave and pt able to utilize microwave appropriately. Pt left seated edge of bed and bed alarm on and all needs in reach.   Therapy/Group: Individual Therapy Verl Dicker Verl Dicker PT, DPT  03/18/2022, 7:57 AM

## 2022-03-18 NOTE — Progress Notes (Signed)
Occupational Therapy Session Note  Patient Details  Name: Timothy Hubbard MRN: [id number] Date of Birth: [birth year]  Today's Date: 03/18/2022 OT Individual Time: 8182-9937 OT Individual Time Calculation (min): 60 min    Short Term Goals: Week 1:  OT Short Term Goal 1 (Week 1): Pt to be CGA for functional transfers OT Short Term Goal 2 (Week 1): Pt to be CGA for LB bathing/dressiong OT Short Term Goal 3 (Week 1): Pt to demonstrate improved perception during self care with min verbal cues OT Short Term Goal 4 (Week 1): Pt to demonstrate improved safety awareness with mod verbal cues provided to reduce risk for falls OT Short Term Goal 5 (Week 1): Pt to be CGA for toileting  Skilled Therapeutic Interventions/Progress Updates:    Upon OT arrival, pt seated EOB eating his breakfast and pt's family present. Pt's family educated on progress towards independence and provided recommendations for DME. Pt's family verbalized understanding. Treatment intervention with a focus on, self care training, higher level IADLs, endurance and strengthening. Pt donns shoes Independently, completes sit to stand transfer independently and ambulates within room independently to tidy up and complete oral care independently. Pt ambulates to tub room independently to engage in tub transfer training with tub transfer bench present. Pt completes multiple trials independently. Pt ambulates to rehab apartment independently to retrieve, wash, dry and put away dishes without requiring a rest break or having any LOB. Pt sweeps floor independently without requiring rest break. Pt's family excited with pt's progress. Pt ambulates to ortho gym to complete UE exercises using 4lb dumbbell using B UE for 1x10 reps of shoulder flex/ext, elbow flex/ext, chest press, and shoulder abd/add. Pt requires verbal cues to correct form during exercises. Pt completes all exercises while standing without rest break. Pt ambulates back to his room  independently and completes stand to sit transfer into bed independently. Pt was left in bed with all needs met and family present. Pt reports no pain during session but requests hot pack for his back at end of session.   Therapy Documentation Precautions:  Precautions Precautions: Fall, Other (comment) Precaution Comments: seizure Restrictions Weight Bearing Restrictions: No   Therapy/Group: Individual Therapy  Marvetta Gibbons 03/18/2022, 12:22 PM

## 2022-03-18 NOTE — Progress Notes (Signed)
PROGRESS NOTE   Subjective/Complaints: No further hematochezia or hematuria.  No new concerns.   ROS Review of Systems  Constitutional: Negative.   Respiratory:  Negative for cough and shortness of breath.   Cardiovascular:  Negative for chest pain.  Gastrointestinal:  Negative for abdominal pain, blood in stool, constipation, diarrhea, heartburn, nausea and vomiting.  Genitourinary:  Negative for dysuria and hematuria.  Neurological:  Negative for dizziness and headaches.      Objective:   No results found. Recent Labs    03/16/22 0724  WBC 5.9  HGB 9.3*  HCT 29.8*  PLT 318    No results for input(s): "NA", "K", "CL", "CO2", "GLUCOSE", "BUN", "CREATININE", "CALCIUM" in the last 72 hours.   Intake/Output Summary (Last 24 hours) at 03/18/2022 0846 Last data filed at 03/17/2022 1825 Gross per 24 hour  Intake 476 ml  Output --  Net 476 ml         Physical Exam: Vital Signs Blood pressure 125/81, pulse (!) 109, temperature 98.6 F (37 C), temperature source Oral, resp. rate 18, height '5\' 4"'$  (1.626 m), weight 60.1 kg, SpO2 100 %.   General: No acute distress Mood and affect are appropriate Heart: RRR. no rubs murmurs or gallops Lungs: Clear to auscultation, breathing unlabored, no rales or wheezes, good air movement Abdomen: Positive bowel sounds, soft nontender to palpation, non-distended Extremities: No clubbing, cyanosis, or edema Skin: No evidence of breakdown, no evidence of rash Neurologic: No dizziness, or lightheadedness Cranial nerves II through XII intact, motor strength is 5/5 in bilateral deltoid, bicep, tricep, grip, hip flexor, knee extensors, ankle dorsiflexor and plantar flexor Sensory exam normal sensation to light touch and proprioception in bilateral upper and lower extremities Cerebellar exam normal finger to nose to finger as well as heel to shin in bilateral upper and lower  extremities Musculoskeletal: Full range of motion in all 4 extremities. No joint swelling Skin:    General: Skin is warm and dry.  Neurological:     Comments:  Follows simple commands. Dysarthria- a little improved Intact to light touch in all 4 extremities  Psychiatric:     Comments: normal affect, pleasant  Assessment/Plan: 1. Functional deficits which require 3+ hours per day of interdisciplinary therapy in a comprehensive inpatient rehab setting. Physiatrist is providing close team supervision and 24 hour management of active medical problems listed below. Physiatrist and rehab team continue to assess barriers to discharge/monitor patient progress toward functional and medical goals  Care Tool:  Bathing    Body parts bathed by patient: Right arm, Left arm, Chest, Abdomen, Front perineal area, Buttocks, Right upper leg, Left upper leg, Right lower leg, Left lower leg, Face         Bathing assist Assist Level: Minimal Assistance - Patient > 75%     Upper Body Dressing/Undressing Upper body dressing   What is the patient wearing?: Pull over shirt    Upper body assist Assist Level: Contact Guard/Touching assist    Lower Body Dressing/Undressing Lower body dressing      What is the patient wearing?: Pants, Underwear/pull up     Lower body assist Assist for lower body dressing: Minimal Assistance -  Patient > 75%     Chartered loss adjuster assist Assist for toileting: Minimal Assistance - Patient > 75%     Transfers Chair/bed transfer  Transfers assist     Chair/bed transfer assist level: Supervision/Verbal cueing     Locomotion Ambulation   Ambulation assist      Assist level: Supervision/Verbal cueing Assistive device: No Device Max distance: 1,023 ft   Walk 10 feet activity   Assist     Assist level: Supervision/Verbal cueing Assistive device: No Device   Walk 50 feet activity   Assist    Assist level: Supervision/Verbal  cueing Assistive device: No Device    Walk 150 feet activity   Assist Walk 150 feet activity did not occur: Safety/medical concerns  Assist level: Supervision/Verbal cueing Assistive device: No Device    Walk 10 feet on uneven surface  activity   Assist Walk 10 feet on uneven surfaces activity did not occur: Safety/medical concerns   Assist level: Supervision/Verbal cueing Assistive device: Other (comment) (No AD)   Wheelchair     Assist Is the patient using a wheelchair?: No (pt ambulating during session)             Wheelchair 50 feet with 2 turns activity    Assist            Wheelchair 150 feet activity     Assist          Blood pressure 125/81, pulse (!) 109, temperature 98.6 F (37 C), temperature source Oral, resp. rate 18, height '5\' 4"'$  (1.626 m), weight 60.1 kg, SpO2 100 %.    Medical Problem List and Plan: 1. Functional deficits secondary to acute encephalopathy provoked secondary to polypharmacy/alcohol cessation.             -patient may  shower             -ELOS/Goals: 7/15- Supervision  -CIR cont PT/OT/SLP  -Plan for discharge home tomorrow 2.  Antithrombotics: -DVT/anticoagulation:  Mechanical: Antiembolism stockings, thigh (TED hose) Bilateral lower extremities             -antiplatelet therapy: N/A 3. Pain Management: Tylenol as needed 4. Mood/Sleep: Provide emotional support             -antipsychotic agents: Zyprexa 5 mg nightly 5. Neuropsych/cognition: This patient is not capable of making decisions on his own behalf. 6. Skin/Wound Care: Routine skin checks 7. Fluids/Electrolytes/Nutrition: Routine INO's with follow-up chemistries 8.  Seizure prophylaxis.  Depakote 500 mg twice daily.  EEG negative 9.  Hypertension.  Norvasc 5 mg daily 7/9 SBP 130's-150's primary team to consider adjustments for tighter BP control. 7/10 continue to monitor trend 7/11 Norvasc was  increased to '10mg'$  on 03/13/22, follow trend and consider  additional medication 7/12 Start lisinopril '5mg'$  daily 7/14 improved this AM, continue to monitor Vitals:   03/17/22 1851 03/18/22 0408  BP: (!) 144/100 125/81  Pulse: (!) 110 (!) 109  Resp: 18 18  Temp: 97.9 F (36.6 C) 98.6 F (37 C)  SpO2: 100% 100%    10.  Asthma.  Continue Dulera as well as nebulizer as needed.  Check oxygen saturations every shift 11.  History of alcohol tobacco abuse.  Counseling. 7/9 No withdrawal symptoms. 12.  Iron deficiency anemia.  Patient did receive 2 units packed red blood cell 03/05/2022.  Follow-up CBC stable at 8.7 on 03/11/22 7/9 Hgb dropped today to 7.9, following episode rectal bleeding.  1U PRBC's ordered, will obtain post  transfusion H/H. -7/10 CBC improved to 9.5 after transfusion -7/12 HGB stable a 9.3 13.  GERD.  Protonix 14/ Low albumin  -Monitor PO intake, discussed eating foods with protein  -Appears to be eating 100% most meals 15. Bright red blood per rectum bleeding following BM  7/9 Occurred while working with PT today, about 30-50 cc estimated to saturate a menstrual pad.  Patient denies straining or other episodes of rectal bleeding.  Denies nausea, vomiting, coffee ground emesis, dizziness, lightheadedness, or weakness.  However, became tachycardic with HR of 125. -Hgb dropped today to 7.9, following episode rectal bleeding.  -1U PRBC's ordered, will obtain post transfusion H/H. GI consulted, with pt unwilling to participate in rectal exam.  GI recommendations: Plan for colonoscopy tomorrow 7/10 for further evaluation of rectal bleeding and iron deficiency anemia with Dr. Bryan Lemma.   -Patient will be on a clear liquid diet and n.p.o. at midnight 7/10 -Patient has been ordered MoviPrep which he will start at 3:00 in split dose fashion.  -7/10 Bleeding hemorrhoids on colonoscopy noted. Per GI conservative therapy recommended with keeping stools soft/no straining. If not improved consider colorectal surgery consult(likely as  outpatient) 7/11 Docusate '100mg'$  BID, miralax prn -7/14 denies additional GI bleeding, recommend increased fiber in diet, metamucil ordered 15. Hematuria  -U/A completed, KUB without renal stones noted  -Resolved, consider renal hematuria if recurs   -7/14 resolved, f/u with his PCP after discharge to monitor   LOS: 8 days A FACE TO [place] 03/18/2022, 8:46 AM

## 2022-03-18 NOTE — Progress Notes (Signed)
Patient ID: Timothy Hubbard, male   DOB: 05-31-1991, 31 y.o.   MRN: [id number]  Family here for family training and feel comfortable with pt's care needs. Dan-PA went over DC Instructions while here in anticipation of discharge tomorrow am. Pt happy to be going home tomorrow.

## 2022-03-18 NOTE — Progress Notes (Addendum)
Physical Therapy Session Note  Patient Details  Name: Timothy Hubbard MRN: [id number] Date of Birth: 11-Apr-1991  Today's Date: 03/18/2022 PT Individual Time: 1115-1155 PT Individual Time Calculation (min): 40 min   Short Term Goals: Week 1:  PT Short Term Goal 1 (Week 1): pt will ambulate x 150 ft with CGA or better PT Short Term Goal 2 (Week 1): Pt will initiate stair training PT Short Term Goal 3 (Week 1): Pt will ambulate in distracting environment without LOB  Skilled Therapeutic Interventions/Progress Updates:      Pt sitting EOB to start session with family (mother, father, sister) at bedside for scheduled family training/education. Pt denies pain.   Pt completes all functional mobility independently without assist or cues. Mobility includes sit<>stands, ambulation >549f, car transfers, and 12 stairs with no hand rails. Gait speed appropriate for age. No LOB or tripping throughout session. Family observing and had no questions regarding DC plan or safety at the house.  Remainder of session worked on dynamic standing balance using compliant surfaces such as inverted bosu ball and blue airex foam pad. CGA for safety while completing these high level balance tasks. Tasks included no UE support with eyes open/closed, single leg balance, and squats on inverted bosu ball. On blue pad, worked on knee stabiliy in stance with single leg balance, righting reactions, and hip/ankle strategies.   Pt returned to room and remained seated EOB with family present. All needs met.   Therapy Documentation Precautions:  Precautions Precautions: Fall, Other (comment) Precaution Comments: seizure Restrictions Weight Bearing Restrictions: No General:    Therapy/Group: Individual Therapy  Daissy Yerian P Sylis Ketchum 03/18/2022, 12:00 PM

## 2022-03-18 NOTE — Telephone Encounter (Signed)
Patient Advocate Encounter  Prior Authorization for OLANZapine '5MG'$  tablets has been approved.    PA# [id number] Effective dates: 03/18/2022 through 03/19/2023      Lyndel Safe, [place] Patient Advocate Specialist [place] Patient Advocate Team Direct Number: [phone]  Fax: [phone]

## 2022-03-18 NOTE — Progress Notes (Signed)
Inpatient Rehabilitation Discharge Medication Review by a Pharmacist  A complete drug regimen review was completed for this patient to identify any potential clinically significant medication issues.  High Risk Drug Classes Is patient taking? Indication by Medication  Antipsychotic Yes Olanzapine - AMS   Anticoagulant No   Antibiotic No   Opioid No   Antiplatelet No   Hypoglycemics/insulin No   Vasoactive Medication No Amlodipine, lisinopril - HTN  Chemotherapy No   Other Yes Acetaminophen - PRN pain  Divalproex - seizure ppx  Docusate - PRN constipation  Folic acid, multivitamin, thiamine - supplement  Magnesium oxide - supplementation  Dulera - asthma  Nicotine patch- smoking cessation Pantoprazole - GERD       Type of Medication Issue Identified Description of Issue Recommendation(s)  Drug Interaction(s) (clinically significant)     Duplicate Therapy     Allergy     No Medication Administration End Date     Incorrect Dose     Additional Drug Therapy Needed     Significant med changes from prior encounter (inform family/care partners about these prior to discharge).    Other       Clinically significant medication issues were identified that warrant physician communication and completion of prescribed/recommended actions by midnight of the next day:  No   Pharmacist comments:   Time spent performing this drug regimen review (minutes):  30   Aiyden Lauderback BS, PharmD, BCPS Clinical Pharmacist 03/18/2022 9:27 AM  Contact: [phone] after 3 PM  "Be curious, not judgmental..." -Jamal Maes

## 2022-03-18 NOTE — Telephone Encounter (Signed)
Patient Advocate Encounter   Received notification that prior authorization for OLANZapine '5MG'$  tablets is required.   PA submitted on 03/18/2022 Key [id number] Status is pending       Lyndel Safe, Timothy Hubbard Patient Advocate Specialist [place] Patient Advocate Team Direct Number: [phone]  Fax: [phone]

## 2022-03-19 DIAGNOSIS — J45909 Unspecified asthma, uncomplicated: Secondary | ICD-10-CM

## 2022-03-19 NOTE — Progress Notes (Signed)
PROGRESS NOTE   Subjective/Complaints: No further bleeding. He is excited to go home.    ROS Review of Systems  Constitutional: Negative.   Eyes: Negative.   Respiratory:  Negative for cough and shortness of breath.   Cardiovascular:  Negative for chest pain.  Gastrointestinal:  Negative for abdominal pain, blood in stool, constipation, diarrhea, heartburn, nausea and vomiting.  Genitourinary:  Negative for dysuria and hematuria.  Neurological:  Negative for dizziness and headaches.      Objective:   No results found. No results for input(s): "WBC", "HGB", "HCT", "PLT" in the last 72 hours.  No results for input(s): "NA", "K", "CL", "CO2", "GLUCOSE", "BUN", "CREATININE", "CALCIUM" in the last 72 hours.   Intake/Output Summary (Last 24 hours) at 03/19/2022 0823 Last data filed at 03/19/2022 0811 Gross per 24 hour  Intake 594 ml  Output --  Net 594 ml         Physical Exam: Vital Signs Blood pressure (!) 155/94, pulse (!) 110, temperature 98.1 F (36.7 C), temperature source Oral, resp. rate 18, height '5\' 4"'$  (1.626 m), weight 61.1 kg, SpO2 100 %.   General: No acute distress, lying in bed Mood and affect are appropriate Heart: regular rhythm. no rubs murmurs or gallops Lungs: Clear to auscultation, breathing unlabored, no rales or wheezes, good air movement Abdomen: Positive bowel sounds, soft nontender to palpation, non-distended Extremities: No clubbing, cyanosis, or edema Skin: No evidence of breakdown, no evidence of rash, warm and dry Neurologic: No dizziness, or lightheadedness Cranial nerves II through XII intact, motor strength is 5/5 in bilateral deltoid, bicep, tricep, grip, hip flexor, knee extensors, ankle dorsiflexor and plantar flexor Sensory exam normal sensation to light touch and proprioception in bilateral upper and lower extremities Cerebellar exam normal finger to nose to finger as well as  heel to shin in bilateral upper and lower extremities Musculoskeletal: Full range of motion in all 4 extremities. No joint swelling Skin:    General: Skin is warm and dry.  Neurological:     Comments:  Follows simple commands. Dysarthria- a little improved Intact to light touch in all 4 extremities  Psychiatric:     Comments: normal affect, pleasant  Assessment/Plan: 1. Functional deficits which require 3+ hours per day of interdisciplinary therapy in a comprehensive inpatient rehab setting. Physiatrist is providing close team supervision and 24 hour management of active medical problems listed below. Physiatrist and rehab team continue to assess barriers to discharge/monitor patient progress toward functional and medical goals  Care Tool:  Bathing    Body parts bathed by patient: Right arm, Left arm, Chest, Abdomen, Front perineal area, Buttocks, Right upper leg, Left upper leg, Right lower leg, Left lower leg, Face         Bathing assist Assist Level: Minimal Assistance - Patient > 75%     Upper Body Dressing/Undressing Upper body dressing   What is the patient wearing?: Pull over shirt    Upper body assist Assist Level: Contact Guard/Touching assist    Lower Body Dressing/Undressing Lower body dressing      What is the patient wearing?: Pants, Underwear/pull up     Lower body assist Assist for  lower body dressing: Minimal Assistance - Patient > 75%     Toileting Toileting    Toileting assist Assist for toileting: Minimal Assistance - Patient > 75%     Transfers Chair/bed transfer  Transfers assist     Chair/bed transfer assist level: Supervision/Verbal cueing     Locomotion Ambulation   Ambulation assist      Assist level: Supervision/Verbal cueing Assistive device: No Device Max distance: 1,023 ft   Walk 10 feet activity   Assist     Assist level: Supervision/Verbal cueing Assistive device: No Device   Walk 50 feet  activity   Assist    Assist level: Supervision/Verbal cueing Assistive device: No Device    Walk 150 feet activity   Assist Walk 150 feet activity did not occur: Safety/medical concerns  Assist level: Supervision/Verbal cueing Assistive device: No Device    Walk 10 feet on uneven surface  activity   Assist Walk 10 feet on uneven surfaces activity did not occur: Safety/medical concerns   Assist level: Supervision/Verbal cueing Assistive device: Other (comment) (No AD)   Wheelchair     Assist Is the patient using a wheelchair?: No             Wheelchair 50 feet with 2 turns activity    Assist            Wheelchair 150 feet activity     Assist          Blood pressure (!) 155/94, pulse (!) 110, temperature 98.1 F (36.7 C), temperature source Oral, resp. rate 18, height '5\' 4"'$  (1.626 m), weight 61.1 kg, SpO2 100 %.    Medical Problem List and Plan: 1. Functional deficits secondary to acute encephalopathy provoked secondary to polypharmacy/alcohol cessation.             -patient may  shower             -ELOS/Goals: 7/15- Supervision  -CIR cont PT/OT/SLP  -DC today 2.  Antithrombotics: -DVT/anticoagulation:  Mechanical: Antiembolism stockings, thigh (TED hose) Bilateral lower extremities             -antiplatelet therapy: N/A 3. Pain Management: Tylenol as needed 4. Mood/Sleep: Provide emotional support             -antipsychotic agents: Zyprexa 5 mg nightly 5. Neuropsych/cognition: This patient is not capable of making decisions on his own behalf. 6. Skin/Wound Care: Routine skin checks 7. Fluids/Electrolytes/Nutrition: Routine INO's with follow-up chemistries 8.  Seizure prophylaxis.  Depakote 500 mg twice daily.  EEG negative 9.  Hypertension.  Norvasc 5 mg daily 7/9 SBP 130's-150's primary team to consider adjustments for tighter BP control. 7/10 continue to monitor trend 7/11 Norvasc was  increased to '10mg'$  on 03/13/22, follow trend  and consider additional medication 7/12 Start lisinopril '5mg'$  daily 7/15 stable overall, f/u with PCP for continued monitoring Vitals:   03/19/22 0330 03/19/22 0821  BP: (!) 155/94   Pulse: (!) 110   Resp: 18   Temp: 98.1 F (36.7 C)   SpO2: 97% 100%    10.  Asthma.  Continue Dulera as well as nebulizer as needed.  Check oxygen saturations every shift -Denies shortness of breath, stable on RA 11.  History of alcohol tobacco abuse.  Counseling. 7/9 No withdrawal symptoms. 12.  Iron deficiency anemia.  Patient did receive 2 units packed red blood cell 03/05/2022.  Follow-up CBC stable at 8.7 on 03/11/22 7/9 Hgb dropped today to 7.9, following episode rectal bleeding.  1U  PRBC's ordered, will obtain post transfusion H/H. -7/10 CBC improved to 9.5 after transfusion -7/12 HGB stable a 9.3 13.  GERD.  Protonix 14/ Low albumin  -Monitor PO intake, discussed eating foods with protein  -Appears to be eating 100% most meals 15. Bright red blood per rectum bleeding following BM  7/9 Occurred while working with PT today, about 30-50 cc estimated to saturate a menstrual pad.  Patient denies straining or other episodes of rectal bleeding.  Denies nausea, vomiting, coffee ground emesis, dizziness, lightheadedness, or weakness.  However, became tachycardic with HR of 125. -Hgb dropped today to 7.9, following episode rectal bleeding.  -1U PRBC's ordered, will obtain post transfusion H/H. GI consulted, with pt unwilling to participate in rectal exam.  GI recommendations: Plan for colonoscopy tomorrow 7/10 for further evaluation of rectal bleeding and iron deficiency anemia with Dr. Bryan Lemma.   -Patient will be on a clear liquid diet and n.p.o. at midnight 7/10 -Patient has been ordered MoviPrep which he will start at 3:00 in split dose fashion.  -7/10 Bleeding hemorrhoids on colonoscopy noted. Per GI conservative therapy recommended with keeping stools soft/no straining. If not improved consider  colorectal surgery consult(likely as outpatient) 7/11 Docusate '100mg'$  BID, miralax prn -7/14 denies additional GI bleeding, recommend increased fiber in diet, metamucil ordered -7/15 No further bleeding 15. Hematuria  -U/A completed, KUB without renal stones noted  -Resolved, consider renal hematuria if recurs   -7/14 resolved, f/u with his PCP after discharge to monitor  -7/15 no further bleeding   LOS: 9 days A FACE TO FACE EVALUATION WAS PERFORMED  Jennye Boroughs 03/19/2022, 8:23 AM

## 2022-03-19 NOTE — Progress Notes (Signed)
INPATIENT REHABILITATION DISCHARGE NOTE   Discharge instructions by:  Linna Hoff, PA  Verbalized understanding: yes  Skin care/Wound care healing? none   Pain: none  IV's: none  Tubes/Drains: none  O2: none  Safety instructions: none  Patient belongings: sent with pt. Discharge meds obtained from rx  Discharged to: home  Discharged via: family transport - discussed smoking cessation. Pt refused nicotine patch stating he is going home and will resume smoking. Discussed risks associated with smoking. Pt stated "I am not going to lie".  Notes: done   Gerald Stabs, RN

## 2022-03-21 NOTE — Progress Notes (Signed)
Occupational Therapy Discharge Summary  Patient Details  Name: Timothy Hubbard MRN: [id number] Date of Birth: 16-Jul-1991     Patient has met 12 of 12 long term goals due to improved activity tolerance, improved balance, postural control, ability to compensate for deficits, improved awareness, and improved coordination.  Patient to discharge at overall Supervision level.  Patient's care partner is independent to provide the necessary physical and cognitive assistance at discharge.    Reasons goals not met: n/a  Recommendation:  Patient will benefit from ongoing skilled OT services in outpatient setting to continue to advance functional skills in the area of BADL and iADL.  Equipment: No equipment provided  Reasons for discharge: treatment goals met  Patient/family agrees with progress made and goals achieved: Yes  OT Discharge Precautions/Restrictions  Precautions Precautions: Fall Restrictions Weight Bearing Restrictions: No   ADL ADL Eating: Independent Where Assessed-Eating: Bed level Grooming: Independent Where Assessed-Grooming: Standing at sink Upper Body Bathing: Supervision/safety Where Assessed-Upper Body Bathing: Shower Lower Body Bathing: Supervision/safety Where Assessed-Lower Body Bathing: Shower Upper Body Dressing: Independent Where Assessed-Upper Body Dressing: Chair Lower Body Dressing: Independent Where Assessed-Lower Body Dressing: Edge of bed Toileting: Independent Where Assessed-Toileting: Glass blower/designer: Distant supervision Armed forces technical officer Method: Counselling psychologist: Emergency planning/management officer Transfer: Distant supervision Social research officer, government Method: Heritage manager: Radio broadcast assistant ADL Comments: Pt participates in modified ADL routine with distant supervision while in room. Pt participates in clothing and item retrieval in hallway to get wash cloths for morning routine with close supervision for  safety. Pt able to ambulate around room, pick items up off the floor, put items away in closet, and don shoes with distant supervision and no LOB noted. Vision Baseline Vision/History: 0 No visual deficits Patient Visual Report: No change from baseline Tracking/Visual Pursuits: Able to track stimulus in all quads without difficulty Saccades: Within functional limits Visual Fields: No apparent deficits Perception  Perception: Impaired Comments: depth perception Praxis Praxis: Intact Cognition Cognition Overall Cognitive Status: History of cognitive impairments - at baseline Orientation Level: Person;Place;Situation Person: Oriented Place: Oriented Situation: Oriented Memory: Impaired Memory Impairment: Decreased recall of new information;Storage deficit Focused Attention: Appears intact Sustained Attention: Impaired Sustained Attention Impairment: Verbal basic;Functional basic Selective Attention: Impaired Selective Attention Impairment: Verbal basic;Functional basic Awareness: Impaired Awareness Impairment: Intellectual impairment Problem Solving: Impaired Problem Solving Impairment: Verbal basic;Functional basic Reasoning: Impaired Reasoning Impairment: Verbal basic;Functional basic Behaviors: Impulsive Safety/Judgment: Impaired Brief Interview for Mental Status (BIMS) Repetition of Three Words (First Attempt): 3 Temporal Orientation: Year: Correct Temporal Orientation: Month: Accurate within 5 days Temporal Orientation: Day: Correct Recall: "Sock": Yes, no cue required Recall: "Blue": Yes, no cue required Recall: "Bed": Yes, no cue required BIMS Summary Score: 15 Sensation Sensation Light Touch: Appears Intact Hot/Cold: Appears Intact Proprioception: Appears Intact Stereognosis: Appears Intact Additional Comments: intact bilaterally Coordination Gross Motor Movements are Fluid and Coordinated: No Fine Motor Movements are Fluid and Coordinated: No Coordination  and Movement Description: impaired balance and coordination Finger Nose Finger Test: impaired bilaterally Heel Shin Test: impaired bilaterally Motor  Motor Motor - Skilled Clinical Observations: grossly uncoordinated due to balance deficits Motor - Discharge Observations: grossly uncoordinated due to balance deficits Mobility  Bed Mobility Rolling Left: Supervision/Verbal cueing Supine to Sit: Supervision/Verbal cueing Sit to Supine: Supervision/Verbal cueing Transfers Sit to Stand: Supervision/Verbal cueing Stand to Sit: Supervision/Verbal cueing  Trunk/Postural Assessment  Cervical Assessment Cervical Assessment: Within Functional Limits Thoracic Assessment Thoracic Assessment: Within Functional Limits Lumbar Assessment Lumbar Assessment:  Within Functional Limits Postural Control Postural Control: Within Functional Limits  Balance Static Sitting Balance Static Sitting - Level of Assistance: 7: Independent Dynamic Sitting Balance Dynamic Sitting - Level of Assistance: 7: Independent Static Standing Balance Static Standing - Level of Assistance: 7: Independent Dynamic Standing Balance Dynamic Standing - Level of Assistance: 5: Stand by assistance Extremity/Trunk Assessment RUE Assessment RUE Assessment: Within Functional Limits General Strength Comments: 4/5 LUE Assessment LUE Assessment: Within Functional Limits General Strength Comments: 4/5   [place] 03/21/2022, 12:24 PM

## 2022-03-21 NOTE — Plan of Care (Signed)
  Problem: RH Balance Goal: LTG: Patient will maintain dynamic sitting balance (OT) Description: LTG:  Patient will maintain dynamic sitting balance with assistance during activities of daily living (OT) Outcome: Completed/Met Goal: LTG Patient will maintain dynamic standing with ADLs (OT) Description: LTG:  Patient will maintain dynamic standing balance with assist during activities of daily living (OT)  Outcome: Completed/Met   Problem: Sit to Stand Goal: LTG:  Patient will perform sit to stand in prep for activites of daily living with assistance level (OT) Description: LTG:  Patient will perform sit to stand in prep for activites of daily living with assistance level (OT) Outcome: Completed/Met   Problem: RH Grooming Goal: LTG Patient will perform grooming w/assist,cues/equip (OT) Description: LTG: Patient will perform grooming with assist, with/without cues using equipment (OT) Outcome: Completed/Met   Problem: RH Bathing Goal: LTG Patient will bathe all body parts with assist levels (OT) Description: LTG: Patient will bathe all body parts with assist levels (OT) Outcome: Completed/Met   Problem: RH Dressing Goal: LTG Patient will perform upper body dressing (OT) Description: LTG Patient will perform upper body dressing with assist, with/without cues (OT). Outcome: Completed/Met Goal: LTG Patient will perform lower body dressing w/assist (OT) Description: LTG: Patient will perform lower body dressing with assist, with/without cues in positioning using equipment (OT) Outcome: Completed/Met   Problem: RH Toileting Goal: LTG Patient will perform toileting task (3/3 steps) with assistance level (OT) Description: LTG: Patient will perform toileting task (3/3 steps) with assistance level (OT)  Outcome: Completed/Met   Problem: RH Simple Meal Prep Goal: LTG Patient will perform simple meal prep w/assist (OT) Description: LTG: Patient will perform simple meal prep with assistance,  with/without cues (OT). Outcome: Completed/Met   Problem: RH Light Housekeeping Goal: LTG Patient will perform light housekeeping w/assist (OT) Description: LTG: Patient will perform light housekeeping with assistance, with/without cues (OT). Outcome: Completed/Met   Problem: RH Toilet Transfers Goal: LTG Patient will perform toilet transfers w/assist (OT) Description: LTG: Patient will perform toilet transfers with assist, with/without cues using equipment (OT) Outcome: Completed/Met   Problem: RH Tub/Shower Transfers Goal: LTG Patient will perform tub/shower transfers w/assist (OT) Description: LTG: Patient will perform tub/shower transfers with assist, with/without cues using equipment (OT) Outcome: Completed/Met

## 2022-03-30 ENCOUNTER — Other Ambulatory Visit: Payer: Self-pay | Admitting: Family Medicine

## 2022-03-30 ENCOUNTER — Ambulatory Visit (INDEPENDENT_AMBULATORY_CARE_PROVIDER_SITE_OTHER): Payer: Medicaid Other | Admitting: Family Medicine

## 2022-03-30 VITALS — BP 114/71 | HR 71 | Ht 64.0 in | Wt 140.2 lb

## 2022-03-30 DIAGNOSIS — F17200 Nicotine dependence, unspecified, uncomplicated: Secondary | ICD-10-CM | POA: Diagnosis not present

## 2022-03-30 DIAGNOSIS — K642 Third degree hemorrhoids: Secondary | ICD-10-CM | POA: Diagnosis not present

## 2022-03-30 DIAGNOSIS — I10 Essential (primary) hypertension: Secondary | ICD-10-CM | POA: Diagnosis not present

## 2022-03-30 DIAGNOSIS — Z09 Encounter for follow-up examination after completed treatment for conditions other than malignant neoplasm: Secondary | ICD-10-CM

## 2022-03-30 DIAGNOSIS — R569 Unspecified convulsions: Secondary | ICD-10-CM

## 2022-03-30 DIAGNOSIS — F1090 Alcohol use, unspecified, uncomplicated: Secondary | ICD-10-CM | POA: Diagnosis not present

## 2022-03-30 DIAGNOSIS — F39 Unspecified mood [affective] disorder: Secondary | ICD-10-CM | POA: Diagnosis not present

## 2022-03-30 DIAGNOSIS — D509 Iron deficiency anemia, unspecified: Secondary | ICD-10-CM

## 2022-03-30 MED ORDER — DIVALPROEX SODIUM 500 MG PO DR TAB: 500.0000 mg | DELAYED_RELEASE_TABLET | Freq: Two times a day (BID) | ORAL | 0 refills | Status: DC

## 2022-03-30 MED ORDER — ACETAMINOPHEN 325 MG PO TABS: 650.0000 mg | ORAL_TABLET | Freq: Four times a day (QID) | ORAL | 0 refills | Status: AC | PRN

## 2022-03-30 MED ORDER — PANTOPRAZOLE SODIUM 40 MG PO TBEC: 40.0000 mg | DELAYED_RELEASE_TABLET | Freq: Every day | ORAL | 0 refills | Status: DC

## 2022-03-30 MED ORDER — OLANZAPINE 5 MG PO TABS: 5.0000 mg | ORAL_TABLET | Freq: Every day | ORAL | 0 refills | Status: DC

## 2022-03-30 MED ORDER — MOMETASONE FURO-FORMOTEROL FUM 100-5 MCG/ACT IN AERO: 2.0000 | INHALATION_SPRAY | Freq: Two times a day (BID) | RESPIRATORY_TRACT | 0 refills | Status: DC

## 2022-03-30 MED ORDER — POLYETHYLENE GLYCOL 3350 17 G PO PACK
17.0000 g | PACK | Freq: Every day | ORAL | 0 refills | Status: DC | PRN
Start: 2022-03-30 — End: 2022-06-27

## 2022-03-30 MED ORDER — FOLIC ACID 1 MG PO TABS: 1.0000 mg | ORAL_TABLET | Freq: Every day | ORAL | 0 refills | Status: DC

## 2022-03-30 MED ORDER — AMLODIPINE BESYLATE 10 MG PO TABS: 10.0000 mg | ORAL_TABLET | Freq: Every day | ORAL | 0 refills | Status: DC

## 2022-03-30 MED ORDER — MAGNESIUM OXIDE 400 MG PO TABS: 400.0000 mg | ORAL_TABLET | Freq: Two times a day (BID) | ORAL | 0 refills | Status: DC

## 2022-03-30 MED ORDER — NICOTINE 14 MG/24HR TD PT24
14.0000 mg | MEDICATED_PATCH | Freq: Every day | TRANSDERMAL | 0 refills | Status: DC
Start: 2022-03-30 — End: 2022-09-09

## 2022-03-30 MED ORDER — NALTREXONE HCL 50 MG PO TABS: 50.0000 mg | ORAL_TABLET | Freq: Every day | ORAL | 0 refills | Status: DC

## 2022-03-30 MED ORDER — LISINOPRIL 5 MG PO TABS: 5.0000 mg | ORAL_TABLET | Freq: Every day | ORAL | 0 refills | Status: DC

## 2022-03-30 MED ORDER — HYDROCORTISONE (PERIANAL) 2.5 % EX CREA: 1.0000 | TOPICAL_CREAM | Freq: Two times a day (BID) | CUTANEOUS | 0 refills | Status: DC

## 2022-03-30 NOTE — Patient Instructions (Addendum)
It was nice seeing you today!  Make sure to follow-up with neurologist tomorrow as scheduled.  Start naltrexone once a day to help with alcohol cravings.  Referral to psychiatrist placed.  Referral for home health services placed.  Medications refilled.  Order placed for shower chair, cane, bedside commode.  Come back for blood work. Give [place] a call before coming in.  Follow-up with Dr. Rock Nephew in 1 month.  Stay well, Zola Button, MD [place] [phone]  --  Make sure to check out at the front desk before you leave today.  Please arrive at least 15 minutes prior to your scheduled appointments.  If you had blood work today, I will send you a MyChart message or a letter if results are normal. Otherwise, I will give you a call.  If you had a referral placed, they will call you to set up an appointment. Please give [place] a call if you don't hear back in the next 2 weeks.  If you need additional refills before your next appointment, please call your pharmacy first.

## 2022-03-30 NOTE — Progress Notes (Signed)
SUBJECTIVE:   CHIEF COMPLAINT / HPI:  Chief Complaint  Patient presents with   [facility]     Patient admitted from 6/27-7/6 for acute encephalopathy and seizures ultimately felt to be related to polypharmacy and alcohol cessation. Initially required ICU monitoring. Workup including head CT/MRI, CTA, vEEG, LP unremarkable. Started on Keppra which was switched to VPA for behavioral issues. Psychiatry was involved and he was started on olanzapine. He required 2 u blood transfusion on 7/1. Underwent colonoscopy 7/10 with findings of grade III internal and external hemorrhoids and single polyp which was resected. Started on Anu-sol. Transferred to rehab from 7/6-7/15.  He is accompanied by mother today.  Since discharge, mother states he has had residual left-sided weakness and balance difficulties.  Prior to hospitalization he was independent, now requiring assistance with ambulation and ADLs such as toileting and bathing.  Mother is requesting order for bedside commode, shower chair, and cane.  She would also like home health services including PT, OT, speech therapy, and personal care services.  He has follow-up appoint with neurology tomorrow. Denies any further seizures since discharge.  He reports no further bleeding but still has rectal pain and itching.  He ran out of Anusol and would like more.  Mother is requesting refills of all of his medications.  He has been compliant with his antihypertensives.  He never started the olanzapine due to insurance issues but mother states this was recently approved.  Mother reports history of bipolar disorder and previously had been going to [place] but last visit was maybe 2 years ago.  She plans to bring him back to be established with psychiatry again. Denies SI.  He has cut back smoking significantly, down to 6 cigarettes per day. He has restarted drinking after hospitalization but has cut down significantly to 2 cans of light beer per  day. Mother would like for him to start on a medication to help with alcohol cravings. No history of opioid abuse.  Mother was under the impression  Follow-up recommendations: Follow up with your primary care provider after completion of rehab. Follow-up with neurology in 4 to 6 weeks.  Internal referral has been made.  No driving until seen by neurology.  PERTINENT  PMH / PSH: HTN, asthma, GERD  Patient Care Team: Alcus Dad, MD as PCP - General (Family Medicine)   OBJECTIVE:   BP 114/71   Pulse 71   Ht '5\' 4"'$  (1.626 m)   Wt 140 lb 4 oz (63.6 kg)   SpO2 99%   BMI 24.07 kg/m   Physical Exam Constitutional:      General: He is not in acute distress.    Appearance: Normal appearance.     Comments: Assisted to examination table by mother  HENT:     Head: Normocephalic and atraumatic.     Mouth/Throat:     Mouth: Mucous membranes are moist.  Eyes:     Extraocular Movements: Extraocular movements intact.     Pupils: Pupils are equal, round, and reactive to light.  Cardiovascular:     Rate and Rhythm: Normal rate and regular rhythm.  Pulmonary:     Effort: Pulmonary effort is normal. No respiratory distress.     Breath sounds: Normal breath sounds.  Neurological:     Mental Status: He is alert.     Comments: Full strength with knee flexion/extension, elbow flexion/extension, and shoulder adduction bilaterally. 4/5 strength with shoulder abduction bilaterally. Finger-to-nose intact.  03/30/2022    9:49 AM  Depression screen PHQ 2/9  Decreased Interest 1  Down, Depressed, Hopeless 3  PHQ - 2 Score 4  Altered sleeping 3  Tired, decreased energy 3  Change in appetite 2  Feeling bad or failure about yourself  1  Trouble concentrating 3  Moving slowly or fidgety/restless 3  PHQ-9 Score 19  Difficult doing work/chores Extremely dIfficult     {Show previous vital signs (optional):23777}    ASSESSMENT/PLAN:   Seizure ([place]) No further seizures since  discharge. He is still having residual weakness and gait difficulty causing new difficulty performing ADLs so I believe he would benefit from home health therapies and personal care services. - has neurology follow-up tomorrow - valproic acid refilled - DME orders bedside commode, shower chair, cane placed - amb referral home health and CCM to assist with acquiring necessary services  Grade III internal hemorrhoids - Anu-sol refilled - Miralax refilled - d/c docusate  Iron deficiency anemia Likely related to hemorrhoids. No further bleeding. - CBC, ferritin future orders placed (declined lab work today) - consider iron supplementation pending labs  Essential hypertension Controlled in office. Medications refilled today.  TOBACCO USER Has reduced to 6 cigarettes/day. - refilled nicotine patch 14 mg/day  Alcohol use disorder Has reduced to 2 cans of beer daily.  Mother interested in medication to assist with reducing cravings. - start naltrexone 50 mg daily  Mood disorder ([place]) Reported history of bipolar disorder per mother.  Has been started on olanzapine during hospitalization, had been unable to obtain after discharge until now.  Previously followed by [facility] but not recently, mother plans to have him reestablish with psychiatry. - refilled olanzapine 5 mg qhs - referral psychiatry    Return in about 4 weeks (around 04/27/2022) for f/u alcohol use, mood disorder.   Zola Button, MD [place]

## 2022-03-31 ENCOUNTER — Ambulatory Visit (INDEPENDENT_AMBULATORY_CARE_PROVIDER_SITE_OTHER): Payer: Medicaid Other | Admitting: Neurology

## 2022-03-31 ENCOUNTER — Telehealth: Payer: Self-pay

## 2022-03-31 ENCOUNTER — Encounter: Payer: Self-pay | Admitting: Neurology

## 2022-03-31 VITALS — BP 111/75 | HR 118 | Ht 64.0 in | Wt 140.0 lb

## 2022-03-31 DIAGNOSIS — G40909 Epilepsy, unspecified, not intractable, without status epilepticus: Secondary | ICD-10-CM

## 2022-03-31 DIAGNOSIS — Z5181 Encounter for therapeutic drug level monitoring: Secondary | ICD-10-CM

## 2022-03-31 DIAGNOSIS — F1910 Other psychoactive substance abuse, uncomplicated: Secondary | ICD-10-CM

## 2022-03-31 DIAGNOSIS — Z8669 Personal history of other diseases of the nervous system and sense organs: Secondary | ICD-10-CM | POA: Diagnosis not present

## 2022-03-31 DIAGNOSIS — F39 Unspecified mood [affective] disorder: Secondary | ICD-10-CM | POA: Insufficient documentation

## 2022-03-31 NOTE — Telephone Encounter (Signed)
Receipt confirmed by [employer].   Alira Fretwell C Genessa Beman, RN  

## 2022-03-31 NOTE — Progress Notes (Signed)
[facility]  PATIENT: Timothy Hubbard DOB: 09/13/1990  REQUESTING CLINICIAN: Flora Lipps, MD HISTORY FROM: Patient, mother and chart review  REASON FOR VISIT: New onset seizure    HISTORICAL  CHIEF COMPLAINT:  Chief Complaint  Patient presents with   New Patient (Initial Visit)    Rm 14. Accompanied by mother. NP internal referral for Seizure, acute encephalopathy.    HISTORY OF PRESENT ILLNESS:  This is a 31 year old gentleman past medical history of alcohol abuse, asthma, GERD, hypertension who is presenting after being admitted to the hospital on June 27 for new onset status epilepticus.  Patient did have MRI brain and EEG which was unremarkable, at that time he has reduced his alcohol intake and patient seizures are likely related to alcohol withdrawal also polysubstance abuse.  He was discharged first to rehab prior to home.  Since being home he is on Depakote 500 mg twice daily but reported having a drink yesterday.  Denies any seizure-like activity and report compliance with the medication. Patient denies any previous history of seizures, denies any seizure risk factors other than the polysubstance abuse.   Handedness: Right handed   Onset: 03/01/22  Seizure Type: Generalized convulsion, starring spells.   Current frequency: Only while hospitalized   Any injuries from seizures: hit his head, tongue biting   Seizure risk factors: Polysubstances abuse   Previous ASMs: Levetiracetam, Valproic acid   Currenty ASMs: Valproic Acid 500 mg BID   ASMs side effects: Denies   Brain Images: Normal Brain    Previous EEGs: Diffuse slowing, no seizures    OTHER MEDICAL CONDITIONS: Hypertension, GERD, alcohol abuse, polysubstance abuse   REVIEW OF SYSTEMS: Full 14 system review of systems performed and negative with exception of: as noted in the HPI   Hospital course and discharge summary  Timothy Hubbard is a 31 yo man with a hx of HTN, asthma, GERD,  ETOH use disorder presented to hospital with multiple seizure-like episodes and altered mental status.  Patient also stumbled and fell in the bathroom and had teeth grinding from seizure for around 7 minutes.  Subsequently, patient was lethargic and confused and was brought into the hospital.   Patient was initially admitted to ICU for new onset seizure-like activity and long-term monitor was initiated which was negative.  Patient was subsequently transferred out of the ICU.  He also had a fall during hospitalization and confusion. Patient required blood transfusion on 03/05/2022.  Neurology and psychiatry were consulted and patient was started on Zyprexa.  Patient also had similar seizure-like activity when family was present at bedside, EEG was again initiated on 03/08/2022 and restraints were placed in.  Psychiatry felt that this was metabolic in nature.  Video EEG did not show any seizure-like activity  Patient had tried to cut down on the alcohol recently.  No signs of withdrawal at this time.  CT MRI of the brain negative.  EEG unremarkable.  Neurology followed the patient during hospitalization. Patient is currently on Depakote from [facility] to help with behavioral issues.  Was advised seizure precautions.  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 Urine drug screen was positive for cocaine and cannabis.  Has been started on Zyprexa.  Psychiatry had seen the patient with an impression of metabolic encephalopathy in nature and psychiatry has signed off.  Continue thiamine, folic acid and multivitamin on discharge.  Appears to be stable at this time.  ALLERGIES: Allergies  Allergen Reactions  Ibuprofen Anaphylaxis    HOME MEDICATIONS: Outpatient Medications Prior to Visit  Medication Sig Dispense Refill   acetaminophen (TYLENOL) 325 MG tablet Take 2 tablets (650 mg total) by mouth every 6 (six) hours as needed for mild pain or moderate pain. 90 tablet 0    amLODipine (NORVASC) 10 MG tablet Take 1 tablet (10 mg total) by mouth daily. 30 tablet 0   divalproex (DEPAKOTE) 500 MG DR tablet TAKE 1 TABLET(500 MG) BY MOUTH TWICE DAILY 010 tablet 0   folic acid (FOLVITE) 1 MG tablet Take 1 tablet (1 mg total) by mouth daily. 30 tablet 0   hydrocortisone (ANUSOL-HC) 2.5 % rectal cream Place 1 Application rectally 2 (two) times daily. 30 g 0   lisinopril (ZESTRIL) 5 MG tablet Take 1 tablet (5 mg total) by mouth daily. 30 tablet 0   magnesium oxide (MAG-OX) 400 MG tablet Take 1 tablet (400 mg total) by mouth 2 (two) times daily. 60 tablet 0   mometasone-formoterol (DULERA) 100-5 MCG/ACT AERO Inhale 2 puffs into the lungs 2 (two) times daily. 13 g 0   Multiple Vitamin (MULTIVITAMIN WITH MINERALS) TABS tablet Take 1 tablet by mouth daily.     naltrexone (DEPADE) 50 MG tablet Take 1 tablet (50 mg total) by mouth daily. 30 tablet 0   nicotine (NICODERM CQ - DOSED IN MG/24 HOURS) 14 mg/24hr patch Place 1 patch (14 mg total) onto the skin daily. 30 patch 0   OLANZapine (ZYPREXA) 5 MG tablet Take 1 tablet (5 mg total) by mouth at bedtime. 30 tablet 0   pantoprazole (PROTONIX) 40 MG tablet Take 1 tablet (40 mg total) by mouth daily. 30 tablet 0   polyethylene glycol (MIRALAX / GLYCOLAX) 17 g packet Take 17 g by mouth daily as needed for moderate constipation. 30 each 0   No facility-administered medications prior to visit.    PAST MEDICAL HISTORY: Past Medical History:  Diagnosis Date   Asthma    HTN (hypertension)     PAST SURGICAL HISTORY: Past Surgical History:  Procedure Laterality Date   COLONOSCOPY WITH PROPOFOL N/A 03/14/2022   Procedure: COLONOSCOPY WITH PROPOFOL;  Surgeon: Lavena Bullion, DO;  Location: [facility];  Service: Gastroenterology;  Laterality: N/A;   POLYPECTOMY  03/14/2022   Procedure: POLYPECTOMY;  Surgeon: Lavena Bullion, DO;  Location: [facility];  Service: Gastroenterology;;    FAMILY HISTORY: Family History  Problem  Relation Age of Onset   Hypertension Mother    Cancer Maternal Grandfather        doesn't know the type    SOCIAL HISTORY: Social History   Socioeconomic History   Marital status: Single    Spouse name: Not on file   Number of children: Not on file   Years of education: Not on file   Highest education level: Not on file  Occupational History   Occupation: disablity    Comment: Doesn't want to discuss  Tobacco Use   Smoking status: Every Day    Packs/day: 0.50    Years: 8.00    Total pack years: 4.00    Types: Cigarettes    Start date: 2009   Smokeless tobacco: Never  Vaping Use   Vaping Use: Every day  Substance and Sexual Activity   Alcohol use: Yes    Alcohol/week: 2.0 standard drinks of alcohol    Types: 2 Cans of beer per week    Comment: every 2-3 days   Drug use: Yes    Frequency: 7.0  times per week    Types: Marijuana    Comment: 3-5 times per day   Sexual activity: Yes    Partners: Female    Birth control/protection: Condom    Comment: occasiional condom use  Other Topics Concern   Not on file  Social History Narrative   Not on file   Social Determinants of Health   Financial Resource Strain: Not on file  Food Insecurity: Not on file  Transportation Needs: Not on file  Physical Activity: Not on file  Stress: Not on file  Social Connections: Not on file  Intimate Partner Violence: Not on file    PHYSICAL EXAM    GENERAL EXAM/CONSTITUTIONAL: Vitals:  Vitals:   03/31/22 0835  BP: 111/75  Pulse: (!) 118  Weight: 140 lb (63.5 kg)  Height: '5\' 4"'$  (1.626 m)   Body mass index is 24.03 kg/m. Wt Readings from Last 3 Encounters:  03/31/22 140 lb (63.5 kg)  03/30/22 140 lb 4 oz (63.6 kg)  03/19/22 134 lb 11.2 oz (61.1 kg)   Patient is in no distress; well developed, nourished and groomed; neck is supple  EYES: Pupils round and reactive to light, Visual fields full to confrontation, Extraocular movements intacts,  No results  found.  MUSCULOSKELETAL: Gait, strength, tone, movements noted in Neurologic exam below  NEUROLOGIC: MENTAL STATUS:      No data to display         awake, alert, oriented to person, place and time recent and remote memory intact normal attention and concentration language fluent, comprehension intact, naming intact fund of knowledge appropriate  CRANIAL NERVE:  2nd, 3rd, 4th, 6th - pupils equal and reactive to light, visual fields full to confrontation, extraocular muscles intact, no nystagmus 5th - facial sensation symmetric 7th - facial strength symmetric 8th - hearing intact 9th - palate elevates symmetrically, uvula midline 11th - shoulder shrug symmetric 12th - tongue protrusion midline  MOTOR:  normal bulk and tone  GAIT/STATION:  normal    DIAGNOSTIC DATA (LABS, IMAGING, TESTING) - I reviewed patient records, labs, notes, testing and imaging myself where available.  Lab Results  Component Value Date   WBC 5.9 03/16/2022   HGB 9.3 (L) 03/16/2022   HCT 29.8 (L) 03/16/2022   MCV 83.9 03/16/2022   PLT 318 03/16/2022      Component Value Date/Time   NA 143 03/11/2022 0502   NA 142 02/04/2020 1101   K 3.9 03/11/2022 0502   CL 115 (H) 03/11/2022 0502   CO2 21 (L) 03/11/2022 0502   GLUCOSE 88 03/11/2022 0502   BUN 5 (L) 03/11/2022 0502   BUN 8 02/04/2020 1101   CREATININE 0.59 (L) 03/11/2022 0502   CREATININE 1.03 10/19/2016 1017   CALCIUM 8.2 (L) 03/11/2022 0502   PROT 5.2 (L) 03/11/2022 0502   ALBUMIN 2.5 (L) 03/11/2022 0502   AST 25 03/11/2022 0502   ALT 19 03/11/2022 0502   ALKPHOS 60 03/11/2022 0502   BILITOT 0.6 03/11/2022 0502   GFRNONAA >60 03/11/2022 0502   GFRNONAA >89 10/19/2016 1017   GFRAA 138 02/04/2020 1101   GFRAA >89 10/19/2016 1017   No results found for: "CHOL", "HDL", "LDLCALC", "LDLDIRECT", "TRIG" Lab Results  Component Value Date   HGBA1C <4.2 (L) 03/03/2022   Lab Results  Component Value Date   VITAMINB12 338  03/05/2022   Lab Results  Component Value Date   TSH 1.889 03/02/2022    MRI Brain 03/02/22 1. No acute intracranial pathology or epileptogenic  focus identified. 2. Mild global parenchymal volume loss, accelerated for age   CTA Head and Neck 03/02/22 1. No acute intracranial pathology. 2. Normal CTA of the head and neck.  I personally reviewed brain Images and previous EEG reports.   ASSESSMENT AND PLAN  31 y.o. year old male  with history of polysubstance abuse, alcohol abuse, GERD, hypertension who is presenting after being admitted to the hospital for status epilepticus requiring initially 2 antiseizure medications.  Patient required ICU stay, EEG was negative for any active seizures.  Seizure etiology likely will substance abuse, he was initially discharged to rehab prior to home.  Since being home he is compliant with his Depakote 500 mg twice daily but reports still using alcohol, last drink was yesterday but he reported he cut down his alcohol intake.  At this point I recommend patient to continue Depakote 500 mg twice daily, will check a level today and will most likely continue the medication probably for a year and if there is no additional seizures then will discontinue.  Also spent additional time discussing alcohol abuse and symptoms of alcohol withdrawal including seizures with patient and mother.  We discussed about getting therapy, joining AA meeting to help him with alcohol cessation.   Also discussed driving restriction for the next 6 months.  I will see patient in 6 months or sooner if worse.  He voices understanding.   1. Seizure disorder ([place])   2. Polysubstance abuse ([place])   3. History of encephalopathy     Patient Instructions  Continue with Depakote 500 mg BID  Continue your other medications including multivitamin  Driving restriction for the next 6 months  Recommend alcohol cessation  Return in 6 months    Per [place] statutes, patients with  seizures are not allowed to drive until they have been seizure-free for six months.  Other recommendations include using caution when using heavy equipment or power tools. Avoid working on ladders or at heights. Take showers instead of baths.  Do not swim alone.  Ensure the water temperature is not too high on the home water heater. Do not go swimming alone. Do not lock yourself in a room alone (i.e. bathroom). When caring for infants or small children, sit down when holding, feeding, or changing them to minimize risk of injury to the child in the event you have a seizure. Maintain good sleep hygiene. Avoid alcohol.  Also recommend adequate sleep, hydration, good diet and minimize stress.   During the Seizure  - First, ensure adequate ventilation and place patients on the floor on their left side  Loosen clothing around the neck and ensure the airway is patent. If the patient is clenching the teeth, do not force the mouth open with any object as this can cause severe damage - Remove all items from the surrounding that can be hazardous. The patient may be oblivious to what's happening and may not even know what he or she is doing. If the patient is confused and wandering, either gently guide him/her away and block access to outside areas - Reassure the individual and be comforting - Call 911. In most cases, the seizure ends before EMS arrives. However, there are cases when seizures may last over 3 to 5 minutes. Or the individual may have developed breathing difficulties or severe injuries. If a pregnant patient or a person with diabetes develops a seizure, it is prudent to call an ambulance. - Finally, if the patient does not regain full  consciousness, then call EMS. Most patients will remain confused for about 45 to 90 minutes after a seizure, so you must use judgment in calling for help. - Avoid restraints but make sure the patient is in a bed with padded side rails - Place the individual in a lateral  position with the neck slightly flexed; this will help the saliva drain from the mouth and prevent the tongue from falling backward - Remove all nearby furniture and other hazards from the area - Provide verbal assurance as the individual is regaining consciousness - Provide the patient with privacy if possible - Call for help and start treatment as ordered by the caregiver   After the Seizure (Postictal Stage)  After a seizure, most patients experience confusion, fatigue, muscle pain and/or a headache. Thus, one should permit the individual to sleep. For the next few days, reassurance is essential. Being calm and helping reorient the person is also of importance.  Most seizures are painless and end spontaneously. Seizures are not harmful to others but can lead to complications such as stress on the lungs, brain and the heart. Individuals with prior lung problems may develop labored breathing and respiratory distress.     Orders Placed This Encounter  Procedures   Valproic Acid Level    No orders of the defined types were placed in this encounter.   Return in about 6 months (around 10/01/2022).    Alric Ran, MD 03/31/2022, 9:27 AM  [facility] [address], [place], [place] [address] [phone]

## 2022-03-31 NOTE — Assessment & Plan Note (Signed)
Has reduced to 2 cans of beer daily.  Mother interested in medication to assist with reducing cravings. - start naltrexone 50 mg daily

## 2022-03-31 NOTE — Assessment & Plan Note (Signed)
Reported history of bipolar disorder per mother.  Has been started on olanzapine during hospitalization, had been unable to obtain after discharge until now.  Previously followed by [facility] but not recently, mother plans to have him reestablish with psychiatry. - refilled olanzapine 5 mg qhs - referral psychiatry

## 2022-03-31 NOTE — Patient Instructions (Addendum)
Continue with Depakote 500 mg BID  Continue your other medications including multivitamin  Driving restriction for the next 6 months  Recommend alcohol cessation  Return in 6 months

## 2022-03-31 NOTE — Assessment & Plan Note (Signed)
Has reduced to 6 cigarettes/day. - refilled nicotine patch 14 mg/day

## 2022-03-31 NOTE — Assessment & Plan Note (Signed)
Controlled in office. Medications refilled today.

## 2022-03-31 NOTE — Assessment & Plan Note (Addendum)
No further seizures since discharge. He is still having residual weakness and gait difficulty causing new difficulty performing ADLs so I believe he would benefit from home health therapies and personal care services. - has neurology follow-up tomorrow - valproic acid refilled - DME orders bedside commode, shower chair, cane placed - amb referral home health and CCM to assist with acquiring necessary services

## 2022-03-31 NOTE — Telephone Encounter (Signed)
Community message sent to adapt for bedside commode, shower stool and cane.   Will await response.   Talbot Grumbling, RN

## 2022-03-31 NOTE — Assessment & Plan Note (Signed)
Likely related to hemorrhoids. No further bleeding. - CBC, ferritin future orders placed (declined lab work today) - consider iron supplementation pending labs

## 2022-03-31 NOTE — Assessment & Plan Note (Addendum)
-   Anu-sol refilled - Miralax refilled - d/c docusate

## 2022-04-01 ENCOUNTER — Other Ambulatory Visit: Payer: Self-pay

## 2022-04-01 LAB — VALPROIC ACID LEVEL: Valproic Acid Lvl: 91 ug/mL (ref 50–100)

## 2022-04-01 NOTE — Patient Outreach (Signed)
Medicaid Managed Care Social Work Note  04/01/2022 Name:  Timothy Hubbard MRN:  [id number] DOB:  [birth year]  Timothy Hubbard is an 31 y.o. year old male who is a primary patient of Timothy Dad, Hubbard.  The [facility] Managed Care Coordination team was consulted for assistance with:  Level of Care Concerns  Timothy Hubbard was given information about Medicaid Managed Care Coordination team services today. Timothy Poag Bir Patient agreed to services and verbal consent obtained.  Engaged with patient  for by telephone forinitial visit in response to referral for case management and/or care coordination services.   Assessments/Interventions:  Review of past medical history, allergies, medications, health status, including review of consultants reports, laboratory and other test data, was performed as part of comprehensive evaluation and provision of chronic care management services.  SDOH: (Social Determinant of Health) assessments and interventions performed: BSW completed a telephone outreach with patients mom. She stated she has been in contact with someone from [facility] to come out and complete an assessment, the individual went to the wrong house and stated she would come to the correct house but never showed. BSW sent an email to [facility] rep to check on if and when a new rep could be sent out for the assessment/evaluation. Mom also stated she would like some resources for AA for patient to attend. BSW provided mom with those resources and information for CAP. No other resources are needed at this time.  Advanced Directives Status:  Not addressed in this encounter.  Care Plan                 Allergies  Allergen Reactions   Ibuprofen Anaphylaxis    Medications Reviewed Today     Reviewed by Timothy Hubbard (Resident) on 03/31/22 at 1124  Med List Status: <None>   Medication Order Taking? Sig Documenting Provider Last Dose Status Informant  acetaminophen (TYLENOL) 325 MG tablet [id number]  Take 2 tablets (650  mg total) by mouth every 6 (six) hours as needed for mild pain or moderate pain. Timothy Hubbard  Active   amLODipine (NORVASC) 10 MG tablet [id number]  Take 1 tablet (10 mg total) by mouth daily. Timothy Hubbard  Active   divalproex (DEPAKOTE) 500 MG DR tablet [id number]  TAKE 1 TABLET(500 MG) BY MOUTH TWICE DAILY Timothy Hubbard  Active   folic acid (FOLVITE) 1 MG tablet [id number]  Take 1 tablet (1 mg total) by mouth daily. Timothy Hubbard  Active   hydrocortisone [facility]) 2.5 % rectal cream [id number] Yes Place 1 Application rectally 2 (two) times daily. Timothy Hubbard  Active   lisinopril (ZESTRIL) 5 MG tablet [id number]  Take 1 tablet (5 mg total) by mouth daily. Timothy Hubbard  Active   magnesium oxide (MAG-OX) 400 MG tablet [id number]  Take 1 tablet (400 mg total) by mouth 2 (two) times daily. Timothy Hubbard  Active   mometasone-formoterol [facility]) 100-5 MCG/ACT Hollie Salk [id number]  Inhale 2 puffs into the lungs 2 (two) times daily. Timothy Hubbard  Active   Multiple Vitamin (MULTIVITAMIN WITH MINERALS) TABS tablet [id number]  Take 1 tablet by mouth daily. Pokhrel, Laxman, Hubbard  Active   naltrexone (DEPADE) 50 MG tablet [id number] Yes Take 1 tablet (50 mg total) by mouth daily. Timothy Hubbard  Active   nicotine (NICODERM CQ - DOSED IN MG/24 HOURS) 14 mg/24hr patch [id number]  Place 1 patch (14 mg total) onto the skin daily. Timothy Hubbard  Active  OLANZapine (ZYPREXA) 5 MG tablet [id number]  Take 1 tablet (5 mg total) by mouth at bedtime. Timothy Hubbard  Active   pantoprazole (PROTONIX) 40 MG tablet [id number]  Take 1 tablet (40 mg total) by mouth daily. Timothy Hubbard  Active   polyethylene glycol (MIRALAX / GLYCOLAX) 17 g packet [id number]  Take 17 g by mouth daily as needed for moderate constipation. Timothy Hubbard  Active             Patient Active Problem List   Diagnosis Date Noted   Mood disorder [facility]) 03/31/2022   Grade III internal hemorrhoids    Hematochezia     Adenomatous polyp of descending colon    Acute encephalopathy 03/10/2022   Hypokalemia 03/04/2022   Iron deficiency anemia 03/04/2022   Seizure ([place])    Encephalopathy acute 03/01/2022   Alcohol use disorder 10/21/2016   Gastroesophageal reflux disease 06/13/2016   Essential hypertension 04/01/2016   Asthma in adult, moderate persistent, with acute exacerbation 04/01/2016   TOBACCO USER 06/13/2009    Conditions to be addressed/monitored per PCP order:   level of care  There are no care plans that you recently modified to display for this patient.   Follow up:  Patient agrees to Care Plan and Follow-up.  Plan: The Managed Medicaid care management team will reach out to the patient again over the next 14 days.  Date/time of next scheduled Social Work care management/care coordination outreach:  04/21/22  Timothy Hubbard, Timothy Hubbard, Timothy Hubbard Medicaid Team  [phone]

## 2022-04-01 NOTE — Patient Instructions (Signed)
Visit Information  Mr. Bluett was given information about Medicaid Managed Care team care coordination services as a part of their [place] Medicaid benefit. Donnamarie Poag Koeppen verbally consented to engagement with the [facility] Managed Care team.   If you are experiencing a medical emergency, please call 911 or report to your local emergency department or urgent care.   If you have a non-emergency medical problem during routine business hours, please contact your provider's office and ask to speak with a nurse.   For questions related to your [facility], please call: [phone] or visit the homepage here: [email or web address]  If you would like to schedule transportation through your [facility], please call the following number at least 2 days in advance of your appointment: [phone]   Rides for urgent appointments can also be made after hours by calling Member Services.  Call the [place] at [phone], at any time, 24 hours a day, 7 days a week. If you are in danger or need immediate medical attention call 911.  If you would like help to quit smoking, call 1-800-QUIT-NOW (934)238-9283) OR Espaol: 1-855-Djelo-Ya (7-253-664-4034) o para ms informacin haga clic aqu or Text READY to 200-400 to register via text  Mr. Harkless - following are the goals we discussed in your visit today:   Goals Addressed   None       Social Worker will follow up in 14 days .   [name], BSW, [place] Managed Medicaid Team  [phone]   Following is a copy of your plan of care:  There are no care plans that you recently modified to display for this patient.

## 2022-04-04 ENCOUNTER — Telehealth: Payer: Self-pay | Admitting: *Deleted

## 2022-04-04 DIAGNOSIS — M6281 Muscle weakness (generalized): Secondary | ICD-10-CM | POA: Diagnosis not present

## 2022-04-04 NOTE — Progress Notes (Signed)
Please call and advise the patient that the recent Depakote level was within normal limits. No further action is required on these tests at this time. Please remind patient to keep any upcoming appointments or tests and to call us with any interim questions, concerns, problems or updates. Thanks,   Alric Ran, MD

## 2022-04-04 NOTE — Telephone Encounter (Signed)
I attempted to call the patient twice. It states the call cannot be completed as dialed. I will attempt the call again later.

## 2022-04-04 NOTE — Telephone Encounter (Signed)
-----   Message from Alric Ran, MD sent at 04/04/2022  9:16 AM EDT ----- Please call and advise the patient that the recent Depakote level was within normal limits. No further action is required on these tests at this time. Please remind patient to keep any upcoming appointments or tests and to call us with any interim questions, concerns, problems or updates. Thanks,   Alric Ran, MD

## 2022-04-05 ENCOUNTER — Telehealth: Payer: Self-pay | Admitting: *Deleted

## 2022-04-05 NOTE — Telephone Encounter (Signed)
I spoke to the patient and provided him with the lab results.  

## 2022-04-05 NOTE — Telephone Encounter (Signed)
PCS form is unable to be completed by social worker, [name], but she did email me a copy for the provider.  Information added to form, printed and placed in provider's box for completion.  Please place back in fax pile when completed.  Thanks [employer]

## 2022-04-06 ENCOUNTER — Encounter: Payer: Self-pay | Admitting: Family Medicine

## 2022-04-06 DIAGNOSIS — R262 Difficulty in walking, not elsewhere classified: Secondary | ICD-10-CM | POA: Insufficient documentation

## 2022-04-06 NOTE — Telephone Encounter (Signed)
PCS form completed and placed in fax bin.  Alcus Dad, MD PGY-3, [place]

## 2022-04-11 ENCOUNTER — Telehealth: Payer: Self-pay

## 2022-04-11 NOTE — Patient Outreach (Signed)
Medicaid Managed Care Social Work Note  04/11/2022 Name:  Timothy Hubbard MRN:  [id number] DOB:  1991/08/24  Timothy Hubbard is an 31 y.o. year old male who is a primary patient of Alcus Dad, MD.  The [facility] Managed Care Coordination team was consulted for assistance with:  Level of Care Concerns  Timothy Hubbard was given information about Medicaid Managed Care Coordination team services today. Timothy Hubbard agreed to services and verbal consent obtained.  Engaged with patient  for by telephone forfollow up visit in response to referral for case management and/or care coordination services.   Assessments/Interventions:  Review of past medical history, allergies, medications, health status, including review of consultants reports, laboratory and other test data, was performed as part of comprehensive evaluation and provision of chronic care management services.  SDOH: (Social Determinant of Health) assessments and interventions performed: BSW returned telephone call to patients mother. She wanted to know who would be setting up PCS for patient. She stated his worker with [facility] has not received the form yet. His worker is Timothy Hubbard and fax number is [phone]. BSW informed she would provide this information to PCP  Advanced Directives Status:  Not addressed in this encounter.  Care Plan                 Allergies  Allergen Reactions   Ibuprofen Anaphylaxis    Medications Reviewed Today     Reviewed by Zola Button, MD (Resident) on 03/31/22 at 1124  Med List Status: <None>   Medication Order Taking? Sig Documenting Provider Last Dose Status Informant  acetaminophen (TYLENOL) 325 MG tablet [id number]  Take 2 tablets (650 mg total) by mouth every 6 (six) hours as needed for mild pain or moderate pain. Zola Button, MD  Active   amLODipine (NORVASC) 10 MG tablet [id number]  Take 1 tablet (10 mg total) by mouth daily. Zola Button, MD  Active   divalproex (DEPAKOTE) 500 MG DR tablet  [id number]  TAKE 1 TABLET(500 MG) BY MOUTH TWICE DAILY Zola Button, MD  Active   folic acid (FOLVITE) 1 MG tablet [id number]  Take 1 tablet (1 mg total) by mouth daily. Zola Button, MD  Active   hydrocortisone [facility]) 2.5 % rectal cream [id number] Yes Place 1 Application rectally 2 (two) times daily. Zola Button, MD  Active   lisinopril (ZESTRIL) 5 MG tablet [id number]  Take 1 tablet (5 mg total) by mouth daily. Zola Button, MD  Active   magnesium oxide (MAG-OX) 400 MG tablet [id number]  Take 1 tablet (400 mg total) by mouth 2 (two) times daily. Zola Button, MD  Active   mometasone-formoterol [facility]) 100-5 MCG/ACT Hollie Salk [id number]  Inhale 2 puffs into the lungs 2 (two) times daily. Zola Button, MD  Active   Multiple Vitamin (MULTIVITAMIN WITH MINERALS) TABS tablet [id number]  Take 1 tablet by mouth daily. Pokhrel, Laxman, MD  Active   naltrexone (DEPADE) 50 MG tablet [id number] Yes Take 1 tablet (50 mg total) by mouth daily. Zola Button, MD  Active   nicotine (NICODERM CQ - DOSED IN MG/24 HOURS) 14 mg/24hr patch [id number]  Place 1 patch (14 mg total) onto the skin daily. Zola Button, MD  Active   OLANZapine [facility]) 5 MG tablet [id number]  Take 1 tablet (5 mg total) by mouth at bedtime. Zola Button, MD  Active   pantoprazole (PROTONIX) 40 MG tablet [id number]  Take 1 tablet (40 mg total) by mouth daily. Zola Button, MD  Active   polyethylene  glycol (MIRALAX / GLYCOLAX) 17 g packet [id number]  Take 17 g by mouth daily as needed for moderate constipation. Zola Button, MD  Active             Patient Active Problem List   Diagnosis Date Noted   Ambulatory dysfunction 04/06/2022   Mood disorder ([place]) 03/31/2022   Grade III internal hemorrhoids    Hematochezia    Adenomatous polyp of descending colon    Acute encephalopathy 03/10/2022   Hypokalemia 03/04/2022   Iron deficiency anemia 03/04/2022   Seizure ([place])    Encephalopathy acute 03/01/2022   Alcohol use disorder 10/21/2016    Gastroesophageal reflux disease 06/13/2016   Essential hypertension 04/01/2016   Asthma in adult, moderate persistent, with acute exacerbation 04/01/2016   TOBACCO USER 06/13/2009    Conditions to be addressed/monitored per PCP order:   level of care   There are no care plans that you recently modified to display for this patient.   Follow up:  Patient agrees to Care Plan and Follow-up.  Plan: The Managed Medicaid care management team will reach out to the patient again over the next 10 days.    [name], BSW, [place] Managed Medicaid Team  [phone]

## 2022-04-11 NOTE — Patient Instructions (Signed)
Visit Information  Timothy Hubbard was given information about Medicaid Managed Care team care coordination services as a part of their [place] Medicaid benefit. Timothy Hubbard verbally consented to engagement with the [facility] Managed Care team.   If you are experiencing a medical emergency, please call 911 or report to your local emergency department or urgent care.   If you have a non-emergency medical problem during routine business hours, please contact your provider's office and ask to speak with a nurse.   For questions related to your [facility], please call: [phone] or visit the homepage here: [email or web address]  If you would like to schedule transportation through your [facility], please call the following number at least 2 days in advance of your appointment: [phone]   Rides for urgent appointments can also be made after hours by calling Member Services.  Call the [place] at [phone], at any time, 24 hours a day, 7 days a week. If you are in danger or need immediate medical attention call 911.  If you would like help to quit smoking, call 1-800-QUIT-NOW 706-229-8918) OR Espaol: 1-855-Djelo-Ya (9-622-297-9892) o para ms informacin haga clic aqu or Text READY to 200-400 to register via text  Timothy Hubbard - following are the goals we discussed in your visit today:   Goals Addressed   None      Social Worker will follow up in 7-10 days .   Timothy Hubbard, BSW, [place] Managed Medicaid Team  [phone]   Following is a copy of your plan of care:  There are no care plans that you recently modified to display for this patient.

## 2022-04-13 ENCOUNTER — Encounter: Payer: Medicaid Other | Admitting: Speech Pathology

## 2022-04-13 NOTE — Therapy (Deleted)
OUTPATIENT SPEECH LANGUAGE PATHOLOGY EVALUATION   Patient Name: Timothy Hubbard MRN: [id number] DOB:May 26, 1991, 31 y.o., male Today's Date: 04/13/2022  PCP: Alcus Dad, MD REFERRING PROVIDER: Cathlyn Parsons, PA-C    Past Medical History:  Diagnosis Date   Asthma    HTN (hypertension)    Past Surgical History:  Procedure Laterality Date   COLONOSCOPY WITH PROPOFOL N/A 03/14/2022   Procedure: COLONOSCOPY WITH PROPOFOL;  Surgeon: Lavena Bullion, DO;  Location: [place];  Service: Gastroenterology;  Laterality: N/A;   POLYPECTOMY  03/14/2022   Procedure: POLYPECTOMY;  Surgeon: Lavena Bullion, DO;  Location: [place] [facility];  Service: Gastroenterology;;   Patient Active Problem List   Diagnosis Date Noted   Ambulatory dysfunction 04/06/2022   Mood disorder ([place]) 03/31/2022   Grade III internal hemorrhoids    Hematochezia    Adenomatous polyp of descending colon    Acute encephalopathy 03/10/2022   Hypokalemia 03/04/2022   Iron deficiency anemia 03/04/2022   Seizure ([place])    Encephalopathy acute 03/01/2022   Alcohol use disorder 10/21/2016   Gastroesophageal reflux disease 06/13/2016   Essential hypertension 04/01/2016   Asthma in adult, moderate persistent, with acute exacerbation 04/01/2016   TOBACCO USER 06/13/2009    ONSET DATE: ***   REFERRING DIAG: G93.40 (ICD-10-CM) - Encephalopathy, unspecified  THERAPY DIAG:  No diagnosis found.  Rationale for Evaluation and Treatment Rehabilitation  SUBJECTIVE:   SUBJECTIVE STATEMENT: *** Pt accompanied by: {accompnied:27141}  PERTINENT HISTORY: 31 year old gentleman past medical history of alcohol abuse, asthma, GERD, hypertension who is presenting after being admitted to the hospital on June 27 for new onset status epilepticus.  Patient did have MRI brain and EEG which was unremarkable, at that time he has reduced his alcohol intake and patient seizures are likely related to alcohol withdrawal also  polysubstance abuse.  He was discharged first to rehab prior to home.  Since being home he is on Depakote 500 mg twice daily but reported having a drink yesterday.  Denies any seizure-like activity and report compliance with the medication. Patient denies any previous history of seizures, denies any seizure risk factors other than the polysubstance abuse.  PAIN:  Are you having pain? {OPRCPAIN:27236}   FALLS: Has patient fallen in last 6 months?  {IOMBTDHR:41638}  LIVING ENVIRONMENT: Lives with: {OPRC lives with:25569::"lives with their family"} Lives in: {Lives in:25570}  PLOF:  Level of assistance: {GTXMIWO:03212} Employment: {SLPemployment:25674}   PATIENT GOALS ***  OBJECTIVE:   DIAGNOSTIC FINDINGS: MRI Brain 03/02/22 1. No acute intracranial pathology or epileptogenic focus identified. 2. Mild global parenchymal volume loss, accelerated for age     CTA Head and Neck 03/02/22 1. No acute intracranial pathology. 2. Normal CTA of the head and neck.  COGNITION: Overall cognitive status: {cognition:24006} Areas of impairment:  {cognitiveimpairmentslp:27409} Functional deficits: ***  COGNITIVE COMMUNICATION Following directions: {commands:24018}  Auditory comprehension: {WFL-Impaired:25365} Verbal expression: {WFL-Impaired:25365} Functional communication: {WFL-Impaired:25365}  ORAL MOTOR EXAMINATION Overall status: {OMESLP2:27645} Comments: ***  STANDARDIZED ASSESSMENTS: {SLPstandardizedassessment:27092}   PATIENT REPORTED OUTCOME MEASURES (PROM): {SLPPROM:27095}   TODAY'S TREATMENT:  ***   PATIENT EDUCATION: Education details: *** Person educated: {Person educated:25204} Education method: {Education Method:25205} Education comprehension: {Education Comprehension:25206}     GOALS: Goals reviewed with patient? {yes/no:20286}  SHORT TERM GOALS: Target date: {follow up:25551}  (Remove Blue Hyperlink)  *** Baseline: Goal status:  {GOALSTATUS:25110}  2.  *** Baseline:  Goal status: {GOALSTATUS:25110}  3.  *** Baseline:  Goal status: {GOALSTATUS:25110}  4.  *** Baseline:  Goal status: {GOALSTATUS:25110}  5.  *** Baseline:  Goal status: {GOALSTATUS:25110}  6.  *** Baseline:  Goal status: {GOALSTATUS:25110}  LONG TERM GOALS: Target date: {follow up:25551}  (Remove Blue Hyperlink)  *** Baseline:  Goal status: {GOALSTATUS:25110}  2.  *** Baseline:  Goal status: {GOALSTATUS:25110}  3.  *** Baseline:  Goal status: {GOALSTATUS:25110}  4.  *** Baseline:  Goal status: {GOALSTATUS:25110}  5.  *** Baseline:  Goal status: {GOALSTATUS:25110}  6.  *** Baseline:  Goal status: {GOALSTATUS:25110}  ASSESSMENT:  CLINICAL IMPRESSION: Patient is a *** y.o. *** who was seen today for ***.   OBJECTIVE IMPAIRMENTS include {SLPOBJIMP:27107}. These impairments are limiting patient from {SLPLIMIT:27108}. Factors affecting potential to achieve goals and functional outcome are {SLP factors:25450}.. Patient will benefit from skilled SLP services to address above impairments and improve overall function.  REHAB POTENTIAL: {rehabpotential:25112}  PLAN: SLP FREQUENCY: {rehab frequency:25116}  SLP DURATION: {rehab duration:25117}  PLANNED INTERVENTIONS: {SLP treatment/interventions:25449}    Su Monks, CCC-SLP 04/13/2022, 7:58 AM

## 2022-04-14 ENCOUNTER — Other Ambulatory Visit (HOSPITAL_COMMUNITY): Payer: Self-pay

## 2022-04-21 ENCOUNTER — Other Ambulatory Visit: Payer: Self-pay

## 2022-04-21 NOTE — Patient Instructions (Signed)
Visit Information  Timothy Hubbard was given information about Medicaid Managed Care team care coordination services as a part of their [place] Medicaid benefit. [name] Thank you for taking time to speak with me today about care coordination and care management services available to you at no cost as part of your Medicaid benefit. These services are voluntary. Our team is available to provide assistance regarding your health care needs at any time. Please do not hesitate to reach out to me if we can be of service to you at any time in the future.     If you are experiencing a medical emergency, please call 911 or report to your local emergency department or urgent care.   If you have a non-emergency medical problem during routine business hours, please contact your provider's office and ask to speak with a nurse.   For questions related to your [facility], please call: [phone] or visit the homepage here: [email or web address]  If you would like to schedule transportation through your [facility], please call the following number at least 2 days in advance of your appointment: [phone]   Rides for urgent appointments can also be made after hours by calling Member Services.  Call the [place] at [phone], at any time, 24 hours a day, 7 days a week. If you are in danger or need immediate medical attention call 911.  If you would like help to quit smoking, call 1-800-QUIT-NOW (779)396-3020) OR Espaol: 1-855-Djelo-Ya (2-297-989-2119) o para ms informacin haga clic aqu or Text READY to 200-400 to register via text  [name] - following are the goals we discussed in your visit today:   Goals Addressed   None      Social Worker will follow up in 30 days .   [name], BSW, [place] Managed Medicaid Team  [phone]   Following is a copy of your plan of care:  There are no care plans that you recently modified to display for this patient.

## 2022-04-21 NOTE — Patient Outreach (Signed)
Medicaid Managed Care Social Work Note  04/21/2022 Name:  Timothy Hubbard MRN:  [id number] DOB:  May 29, 1991  Timothy Hubbard is an 31 y.o. year old male who is a primary patient of Timothy Dad, MD.  The [facility] Managed Care Coordination team was consulted for assistance with:  Level of Care Concerns  Timothy Hubbard was given information about Medicaid Managed Care Coordination team services today. Timothy Hubbard Primary Caregiver agreed to services and verbal consent obtained.  Engaged with patient  for by telephone forfollow up visit in response to referral for case management and/or care coordination services.   Assessments/Interventions:  Review of past medical history, allergies, medications, health status, including review of consultants reports, laboratory and other test data, was performed as part of comprehensive evaluation and provision of chronic care management services.  SDOH: (Social Determinant of Health) assessments and interventions performed: BSW completed a telephone outreach with patients mother. She states patient is still in need of an aide, BSW informed information was faxed over on 04/11/22 and she should be hearing from [facility] soon. She has been missing work due to taking care of patient. She also stated she would like for his medications to be bubble packed from [facility] with AM/PM on them. She also stated patient needs refills on all of his medications. BSW contacted [employer] and spoke with Timothy Hubbard, Timothy Hubbard stated she tries to write in AM/PM on the packs and that new scripts would be needed. They have received all medication scripts from [facility]. No resources are needed at this time.   Advanced Directives Status:  Not addressed in this encounter.  Care Plan                 Allergies  Allergen Reactions   Ibuprofen Anaphylaxis    Medications Reviewed Today     Reviewed by Timothy Button, MD (Resident) on 03/31/22 at 1124  Med List Status: <None>   Medication  Order Taking? Sig Documenting Provider Last Dose Status Informant  acetaminophen (TYLENOL) 325 MG tablet [id number]  Take 2 tablets (650 mg total) by mouth every 6 (six) hours as needed for mild pain or moderate pain. Timothy Button, MD  Active   amLODipine (NORVASC) 10 MG tablet [id number]  Take 1 tablet (10 mg total) by mouth daily. Timothy Button, MD  Active   divalproex (DEPAKOTE) 500 MG DR tablet [id number]  TAKE 1 TABLET(500 MG) BY MOUTH TWICE DAILY Timothy Button, MD  Active   folic acid (FOLVITE) 1 MG tablet [id number]  Take 1 tablet (1 mg total) by mouth daily. Timothy Button, MD  Active   hydrocortisone [facility]) 2.5 % rectal cream [id number] Yes Place 1 Application rectally 2 (two) times daily. Timothy Button, MD  Active   lisinopril (ZESTRIL) 5 MG tablet [id number]  Take 1 tablet (5 mg total) by mouth daily. Timothy Button, MD  Active   magnesium oxide (MAG-OX) 400 MG tablet [id number]  Take 1 tablet (400 mg total) by mouth 2 (two) times daily. Timothy Button, MD  Active   mometasone-formoterol [facility]) 100-5 MCG/ACT Timothy Hubbard [id number]  Inhale 2 puffs into the lungs 2 (two) times daily. Timothy Button, MD  Active   Multiple Vitamin (MULTIVITAMIN WITH MINERALS) TABS tablet [id number]  Take 1 tablet by mouth daily. Timothy Hubbard, Laxman, MD  Active   naltrexone (DEPADE) 50 MG tablet [id number] Yes Take 1 tablet (50 mg total) by mouth daily. Timothy Button, MD  Active   nicotine (NICODERM CQ - DOSED IN MG/24  HOURS) 14 mg/24hr patch [id number]  Place 1 patch (14 mg total) onto the skin daily. Timothy Button, MD  Active   OLANZapine [facility]) 5 MG tablet [id number]  Take 1 tablet (5 mg total) by mouth at bedtime. Timothy Button, MD  Active   pantoprazole (PROTONIX) 40 MG tablet [id number]  Take 1 tablet (40 mg total) by mouth daily. Timothy Button, MD  Active   polyethylene glycol (MIRALAX / GLYCOLAX) 17 g packet [id number]  Take 17 g by mouth daily as needed for moderate constipation. Timothy Button, MD  Active             Patient  Active Problem List   Diagnosis Date Noted   Ambulatory dysfunction 04/06/2022   Mood disorder ([place]) 03/31/2022   Grade III internal hemorrhoids    Hematochezia    Adenomatous polyp of descending colon    Acute encephalopathy 03/10/2022   Hypokalemia 03/04/2022   Iron deficiency anemia 03/04/2022   Seizure ([place])    Encephalopathy acute 03/01/2022   Alcohol use disorder 10/21/2016   Gastroesophageal reflux disease 06/13/2016   Essential hypertension 04/01/2016   Asthma in adult, moderate persistent, with acute exacerbation 04/01/2016   TOBACCO USER 06/13/2009    Conditions to be addressed/monitored per PCP order:   level of care  There are no care plans that you recently modified to display for this patient.   Follow up:  Patient agrees to Care Plan and Follow-up.  Plan: The Managed Medicaid care management team will reach out to the patient again over the next 30 days.  Date/time of next scheduled Social Work care management/care coordination outreach:  05/23/22  Timothy Hubbard, Timothy Hubbard, [place] Managed Medicaid Team  [phone]

## 2022-04-22 ENCOUNTER — Encounter: Payer: Medicaid Other | Admitting: Physical Medicine & Rehabilitation

## 2022-04-25 ENCOUNTER — Telehealth: Payer: Self-pay

## 2022-04-25 ENCOUNTER — Encounter: Payer: Medicaid Other | Attending: Physical Medicine & Rehabilitation | Admitting: Physical Medicine & Rehabilitation

## 2022-04-25 DIAGNOSIS — R262 Difficulty in walking, not elsewhere classified: Secondary | ICD-10-CM

## 2022-04-25 NOTE — Telephone Encounter (Signed)
Patient's mother calls nurse line regarding issues with appointment at [facility]. Patient was supposed to have appointment today, however, was turned away due to "not having an order". They were asked to reach out to our office regarding this matter.   Forwarding to referral coordinator.   Talbot Grumbling, RN

## 2022-04-26 NOTE — Telephone Encounter (Signed)
The only thing I can see is that the referral was originally placed at his last hospital stay in July but since he has medicaid, referrals have to come through his PCP office.  Will forward to MD to see if she will place this and then we can try and see if that will solve it.  Thanks [employer]

## 2022-04-26 NOTE — Telephone Encounter (Signed)
This is strange- I have not heard of someone having an appointment and then being turned away. Regardless, I have placed a new referral. Hopefully this solves the issue.   Alcus Dad, MD PGY-3, [place]

## 2022-04-27 ENCOUNTER — Ambulatory Visit (INDEPENDENT_AMBULATORY_CARE_PROVIDER_SITE_OTHER): Payer: Medicaid Other | Admitting: Family Medicine

## 2022-04-27 ENCOUNTER — Other Ambulatory Visit: Payer: Self-pay | Admitting: Family Medicine

## 2022-04-27 ENCOUNTER — Encounter: Payer: Self-pay | Admitting: Family Medicine

## 2022-04-27 VITALS — BP 100/60 | HR 84 | Ht 64.0 in | Wt 141.4 lb

## 2022-04-27 DIAGNOSIS — R569 Unspecified convulsions: Secondary | ICD-10-CM

## 2022-04-27 DIAGNOSIS — F1090 Alcohol use, unspecified, uncomplicated: Secondary | ICD-10-CM

## 2022-04-27 DIAGNOSIS — Z1159 Encounter for screening for other viral diseases: Secondary | ICD-10-CM

## 2022-04-27 DIAGNOSIS — D509 Iron deficiency anemia, unspecified: Secondary | ICD-10-CM | POA: Diagnosis not present

## 2022-04-27 HISTORY — DX: Encounter for screening for other viral diseases: Z11.59

## 2022-04-27 MED ORDER — NALTREXONE HCL 50 MG PO TABS: 25.0000 mg | ORAL_TABLET | Freq: Every day | ORAL | 0 refills | Status: DC

## 2022-04-27 NOTE — Progress Notes (Signed)
SUBJECTIVE:   CHIEF COMPLAINT / HPI:   [name] is a pleasant 31 year old male who presents today for blood work to evaluate anemia.  He also wants to follow-up on other posthospitalization issues, see below.  Follow-up on anemia While hospitalized 6/27 - 7/6, he required 2 units of packed red blood cell transfusion on 7/1 for anemia.  Subsequent colonoscopy on 7/10 found grade 3 internal and external hemorrhoids.  He was seen for follow-up at [facility] on 7/26, however declined CBC and ferritin at that time for unknown reasons.  He is here today to have his labs checked.  He reports no blood in stool or upon wiping.  Patient admitted from 6/27-7/6 for acute encephalopathy and seizures ultimately felt to be related to polypharmacy and alcohol cessation. Initially required ICU monitoring. Workup including head CT/MRI, CTA, vEEG, LP unremarkable. Started on Keppra which was switched to VPA for behavioral issues. Psychiatry was involved and he was started on olanzapine. He required 2 u blood transfusion on 7/1. Underwent colonoscopy 7/10 with findings of grade III internal and external hemorrhoids and single polyp which was resected. Started on Anu-sol. Transferred to rehab from 7/6-7/15.  Seizure He was hospitalized in late June for acute encephalopathy and seizures felt to be related to polypharmacy and alcohol cessation.  He was started on Keppra initially, but switched to valproic acid prior to discharge.  He has followed up with neurology on 7/27, who continued him on the valproic acid.  He reports no further seizures after discharge from the hospital.  He is doing well and tolerating valproic acid.  At the previous [facility] visit 7/26, he was referred for home health and CCM.  He reports he has been connected with both of these services and has no further needs for referral.  Alcohol use disorder Patient reports continuance of cravings and continues to drink, estimates about 50% of his baseline  alcohol intake prior to hospitalization.  At his last visit 7/26, he was started on naltrexone 50 mg daily.  He reports taking naltrexone only to 3 times a week because it gives him significant GI upset.  He is very interested in cessation, but understands we need to do it gradually given seizures during last hospitalization.   PERTINENT  PMH / PSH: HTN, asthma, IDA likely secondary to grade 3 internal hemorrhoids, seizures, mood disorder, alcohol use disorder, tobacco use  OBJECTIVE:   BP 100/60   Pulse 84   Ht '5\' 4"'$  (1.626 m)   Wt 141 lb 6.4 oz (64.1 kg)   SpO2 98%   BMI 24.27 kg/m    PHQ-9:     04/27/2022    8:28 AM 03/30/2022    9:49 AM 06/09/2020    2:53 PM  Depression screen PHQ 2/9  Decreased Interest 0 1 3  Down, Depressed, Hopeless 0 3 1  PHQ - 2 Score 0 4 4  Altered sleeping '2 3 3  '$ Tired, decreased energy 0 3 3  Change in appetite 0 2 2  Feeling bad or failure about yourself  0 1 1  Trouble concentrating 0 3 2  Moving slowly or fidgety/restless '1 3 2  '$ Suicidal thoughts 0  0  PHQ-9 Score '3 19 17  '$ Difficult doing work/chores Somewhat difficult Extremely dIfficult     Physical Exam General: Awake, alert, oriented Cardiovascular: Regular rate and rhythm, S1 and S2 present, no murmurs auscultated Respiratory: Lung fields clear to auscultation bilaterally Neuro: Cranial nerves II through X grossly intact, able  to move all extremities spontaneously, grip and BUE strength 5/5 on right, 4/5 on left  ASSESSMENT/PLAN:   Iron deficiency anemia Patient amenable to CBC and ferritin today.  We will follow results.  Last blood transfusion 7/10, ferritin today should be reliable.  No further hematochezia.  Seizure ([place]) No seizures since hospital discharge 7/6.  Tolerating valproic acid quite well.  Follows with neurology.  No changes at this time.  Alcohol use disorder GI upset limiting use to 2-3 times per week.  Will reduce dose by half, and have patient take 25 mg daily.   Patient amenable to plan, new prescription sent.  Need for hepatitis C screening test No hep C screening on file, patient okay to add onto blood work today.     Ezequiel Essex, MD [place]

## 2022-04-27 NOTE — Assessment & Plan Note (Signed)
No seizures since hospital discharge 7/6.  Tolerating valproic acid quite well.  Follows with neurology.  No changes at this time.

## 2022-04-27 NOTE — Assessment & Plan Note (Signed)
GI upset limiting use to 2-3 times per week.  Will reduce dose by half, and have patient take 25 mg daily.  Patient amenable to plan, new prescription sent.

## 2022-04-27 NOTE — Assessment & Plan Note (Signed)
No hep C screening on file, patient okay to add onto blood work today.

## 2022-04-27 NOTE — Patient Instructions (Signed)
It was wonderful to meet you today. Thank you for allowing me to be a part of your care. Below is a short summary of what we discussed at your visit today:  Alcohol use disorder Try taking a half a tablet of the naltrexone daily.  This will give you 25 mg of naltrexone daily, which should be easier on your stomach.  Make sure to take with food.  I have sent a new prescription in to your pharmacy so you have enough.  Physical therapy The referral for some physical therapy should still be good.  Please call them back directly to schedule an appointment.  Health Maintenance We like to think about ways to keep you healthy for years to come. Below are some interventions and screenings we can offer to keep you healthy: -Flu vaccine (should be available here in September, please call her to check) -Hepatitis C screening (recommended for all adults once in their life)    Please bring all of your medications to every appointment!  If you have any questions or concerns, please do not hesitate to contact us via phone or MyChart message.   Ezequiel Essex, MD

## 2022-04-27 NOTE — Assessment & Plan Note (Signed)
Patient amenable to CBC and ferritin today.  We will follow results.  Last blood transfusion 7/10, ferritin today should be reliable.  No further hematochezia.

## 2022-04-28 LAB — CBC
Hematocrit: 33.6 % — ABNORMAL LOW (ref 37.5–51.0)
Hemoglobin: 10.7 g/dL — ABNORMAL LOW (ref 13.0–17.7)
MCH: 26.6 pg (ref 26.6–33.0)
MCHC: 31.8 g/dL (ref 31.5–35.7)
MCV: 83 fL (ref 79–97)
Platelets: 328 10*3/uL (ref 150–450)
RBC: 4.03 x10E6/uL — ABNORMAL LOW (ref 4.14–5.80)
RDW: 15.7 % — ABNORMAL HIGH (ref 11.6–15.4)
WBC: 7.3 10*3/uL (ref 3.4–10.8)

## 2022-04-28 LAB — HEPATITIS C ANTIBODY: Hep C Virus Ab: NONREACTIVE

## 2022-04-28 LAB — FERRITIN: Ferritin: 25 ng/mL — ABNORMAL LOW (ref 30–400)

## 2022-05-02 ENCOUNTER — Telehealth: Payer: Self-pay | Admitting: Family Medicine

## 2022-05-02 DIAGNOSIS — D509 Iron deficiency anemia, unspecified: Secondary | ICD-10-CM

## 2022-05-02 MED ORDER — FERROUS SULFATE 324 (65 FE) MG PO TBEC
1.0000 | DELAYED_RELEASE_TABLET | Freq: Every day | ORAL | 3 refills | Status: DC
  Filled 2022-05-02: qty 90, 90d supply, fill #0

## 2022-05-02 MED ORDER — FERROUS SULFATE 324 (65 FE) MG PO TBEC: 1.0000 | DELAYED_RELEASE_TABLET | Freq: Every day | ORAL | 3 refills | Status: DC

## 2022-05-02 NOTE — Telephone Encounter (Signed)
Called to discuss iron labs and anemia. Mother answered phone, patient in room. Recommend iron supplementation daily. Prescription sent. Recommend recheck with PCP at next appointment 05/25/22.  Ezequiel Essex, MD

## 2022-05-03 ENCOUNTER — Other Ambulatory Visit (HOSPITAL_COMMUNITY): Payer: Self-pay

## 2022-05-07 DIAGNOSIS — G9340 Encephalopathy, unspecified: Secondary | ICD-10-CM | POA: Diagnosis not present

## 2022-05-08 DIAGNOSIS — G9340 Encephalopathy, unspecified: Secondary | ICD-10-CM | POA: Diagnosis not present

## 2022-05-09 DIAGNOSIS — G9340 Encephalopathy, unspecified: Secondary | ICD-10-CM | POA: Diagnosis not present

## 2022-05-10 DIAGNOSIS — G9340 Encephalopathy, unspecified: Secondary | ICD-10-CM | POA: Diagnosis not present

## 2022-05-11 DIAGNOSIS — G9340 Encephalopathy, unspecified: Secondary | ICD-10-CM | POA: Diagnosis not present

## 2022-05-12 DIAGNOSIS — G9340 Encephalopathy, unspecified: Secondary | ICD-10-CM | POA: Diagnosis not present

## 2022-05-13 DIAGNOSIS — G9340 Encephalopathy, unspecified: Secondary | ICD-10-CM | POA: Diagnosis not present

## 2022-05-14 DIAGNOSIS — G9340 Encephalopathy, unspecified: Secondary | ICD-10-CM | POA: Diagnosis not present

## 2022-05-15 DIAGNOSIS — G9340 Encephalopathy, unspecified: Secondary | ICD-10-CM | POA: Diagnosis not present

## 2022-05-16 DIAGNOSIS — G9340 Encephalopathy, unspecified: Secondary | ICD-10-CM | POA: Diagnosis not present

## 2022-05-17 DIAGNOSIS — G9340 Encephalopathy, unspecified: Secondary | ICD-10-CM | POA: Diagnosis not present

## 2022-05-18 DIAGNOSIS — G9340 Encephalopathy, unspecified: Secondary | ICD-10-CM | POA: Diagnosis not present

## 2022-05-19 DIAGNOSIS — G9340 Encephalopathy, unspecified: Secondary | ICD-10-CM | POA: Diagnosis not present

## 2022-05-20 DIAGNOSIS — G9340 Encephalopathy, unspecified: Secondary | ICD-10-CM | POA: Diagnosis not present

## 2022-05-21 DIAGNOSIS — G9340 Encephalopathy, unspecified: Secondary | ICD-10-CM | POA: Diagnosis not present

## 2022-05-22 DIAGNOSIS — G9340 Encephalopathy, unspecified: Secondary | ICD-10-CM | POA: Diagnosis not present

## 2022-05-23 ENCOUNTER — Other Ambulatory Visit: Payer: Self-pay

## 2022-05-23 DIAGNOSIS — G9340 Encephalopathy, unspecified: Secondary | ICD-10-CM | POA: Diagnosis not present

## 2022-05-23 NOTE — Patient Instructions (Signed)
Visit Information  [name]  - as a part of your Medicaid benefit, you are eligible for care management and care coordination services at no cost or copay. I was unable to reach you by phone today but would be happy to help you with your health related needs. Please feel free to call me @ [phone].   A member of the Managed Medicaid care management team will reach out to you again over the next 30 days.   [name], BSW, [place] Managed Medicaid Team  [phone]

## 2022-05-23 NOTE — Patient Outreach (Signed)
Care Coordination  05/23/2022  Henry Utsey Zollner 26-Apr-1991 [id number]   Medicaid Managed Care   Unsuccessful Outreach Note  05/23/2022 Name: Timothy Hubbard MRN: [id number] DOB: 11/26/1990  Referred by: Alcus Dad, MD Reason for referral : High Risk Managed Medicaid (MM Social Work Unsuccessful telephone outreach )   An unsuccessful telephone outreach was attempted today. The patient was referred to the case management team for assistance with care management and care coordination.   Follow Up Plan: The care management team will reach out to the patient again over the next 30 days.   [name], BSW, [place] Managed Medicaid Team  [phone]

## 2022-05-24 DIAGNOSIS — G9340 Encephalopathy, unspecified: Secondary | ICD-10-CM | POA: Diagnosis not present

## 2022-05-25 ENCOUNTER — Ambulatory Visit: Payer: Medicaid Other | Admitting: Family Medicine

## 2022-05-25 DIAGNOSIS — G9340 Encephalopathy, unspecified: Secondary | ICD-10-CM | POA: Diagnosis not present

## 2022-05-25 NOTE — Progress Notes (Deleted)
    SUBJECTIVE:   CHIEF COMPLAINT / HPI:   *** Iron Deficiency Anemia -on daily iron supplementation -ferritin 25 at last visit 04/27/22, Hgb 10.7 -Dr Jeani Hawking recommended repeat at this visit  PERTINENT  PMH / PSH: ***  OBJECTIVE:   There were no vitals taken for this visit.  ***  ASSESSMENT/PLAN:   No problem-specific Assessment & Plan notes found for this encounter.     Alcus Dad, MD [place]

## 2022-05-26 DIAGNOSIS — G9340 Encephalopathy, unspecified: Secondary | ICD-10-CM | POA: Diagnosis not present

## 2022-05-27 DIAGNOSIS — G9340 Encephalopathy, unspecified: Secondary | ICD-10-CM | POA: Diagnosis not present

## 2022-05-28 DIAGNOSIS — G9340 Encephalopathy, unspecified: Secondary | ICD-10-CM | POA: Diagnosis not present

## 2022-05-29 DIAGNOSIS — G9340 Encephalopathy, unspecified: Secondary | ICD-10-CM | POA: Diagnosis not present

## 2022-05-30 DIAGNOSIS — G9340 Encephalopathy, unspecified: Secondary | ICD-10-CM | POA: Diagnosis not present

## 2022-05-31 DIAGNOSIS — G9340 Encephalopathy, unspecified: Secondary | ICD-10-CM | POA: Diagnosis not present

## 2022-06-01 DIAGNOSIS — G9340 Encephalopathy, unspecified: Secondary | ICD-10-CM | POA: Diagnosis not present

## 2022-06-02 DIAGNOSIS — G9340 Encephalopathy, unspecified: Secondary | ICD-10-CM | POA: Diagnosis not present

## 2022-06-03 DIAGNOSIS — G9340 Encephalopathy, unspecified: Secondary | ICD-10-CM | POA: Diagnosis not present

## 2022-06-04 DIAGNOSIS — G9340 Encephalopathy, unspecified: Secondary | ICD-10-CM | POA: Diagnosis not present

## 2022-06-05 DIAGNOSIS — G9340 Encephalopathy, unspecified: Secondary | ICD-10-CM | POA: Diagnosis not present

## 2022-06-06 DIAGNOSIS — G9340 Encephalopathy, unspecified: Secondary | ICD-10-CM | POA: Diagnosis not present

## 2022-06-07 DIAGNOSIS — G9340 Encephalopathy, unspecified: Secondary | ICD-10-CM | POA: Diagnosis not present

## 2022-06-08 ENCOUNTER — Other Ambulatory Visit: Payer: Self-pay | Admitting: Family Medicine

## 2022-06-08 DIAGNOSIS — F1090 Alcohol use, unspecified, uncomplicated: Secondary | ICD-10-CM

## 2022-06-08 DIAGNOSIS — G9340 Encephalopathy, unspecified: Secondary | ICD-10-CM | POA: Diagnosis not present

## 2022-06-09 ENCOUNTER — Encounter: Payer: Medicaid Other | Attending: Physical Medicine & Rehabilitation | Admitting: Physical Medicine & Rehabilitation

## 2022-06-09 DIAGNOSIS — G9340 Encephalopathy, unspecified: Secondary | ICD-10-CM | POA: Diagnosis not present

## 2022-06-10 DIAGNOSIS — G9340 Encephalopathy, unspecified: Secondary | ICD-10-CM | POA: Diagnosis not present

## 2022-06-11 DIAGNOSIS — G9340 Encephalopathy, unspecified: Secondary | ICD-10-CM | POA: Diagnosis not present

## 2022-06-12 DIAGNOSIS — G9340 Encephalopathy, unspecified: Secondary | ICD-10-CM | POA: Diagnosis not present

## 2022-06-13 ENCOUNTER — Other Ambulatory Visit: Payer: Self-pay | Admitting: Family Medicine

## 2022-06-13 DIAGNOSIS — G9340 Encephalopathy, unspecified: Secondary | ICD-10-CM | POA: Diagnosis not present

## 2022-06-13 NOTE — Telephone Encounter (Signed)
Please refill ASAP. Pt is out of meds. Please send to [facility]. Ottis Stain, CMA

## 2022-06-14 ENCOUNTER — Other Ambulatory Visit: Payer: Self-pay | Admitting: Family Medicine

## 2022-06-14 DIAGNOSIS — G9340 Encephalopathy, unspecified: Secondary | ICD-10-CM | POA: Diagnosis not present

## 2022-06-15 DIAGNOSIS — G9340 Encephalopathy, unspecified: Secondary | ICD-10-CM | POA: Diagnosis not present

## 2022-06-16 DIAGNOSIS — G9340 Encephalopathy, unspecified: Secondary | ICD-10-CM | POA: Diagnosis not present

## 2022-06-17 DIAGNOSIS — G9340 Encephalopathy, unspecified: Secondary | ICD-10-CM | POA: Diagnosis not present

## 2022-06-18 DIAGNOSIS — G9340 Encephalopathy, unspecified: Secondary | ICD-10-CM | POA: Diagnosis not present

## 2022-06-19 DIAGNOSIS — G9340 Encephalopathy, unspecified: Secondary | ICD-10-CM | POA: Diagnosis not present

## 2022-06-20 DIAGNOSIS — G9340 Encephalopathy, unspecified: Secondary | ICD-10-CM | POA: Diagnosis not present

## 2022-06-21 DIAGNOSIS — G9340 Encephalopathy, unspecified: Secondary | ICD-10-CM | POA: Diagnosis not present

## 2022-06-22 DIAGNOSIS — G9340 Encephalopathy, unspecified: Secondary | ICD-10-CM | POA: Diagnosis not present

## 2022-06-23 DIAGNOSIS — G9340 Encephalopathy, unspecified: Secondary | ICD-10-CM | POA: Diagnosis not present

## 2022-06-24 DIAGNOSIS — G9340 Encephalopathy, unspecified: Secondary | ICD-10-CM | POA: Diagnosis not present

## 2022-06-25 DIAGNOSIS — G9340 Encephalopathy, unspecified: Secondary | ICD-10-CM | POA: Diagnosis not present

## 2022-06-26 DIAGNOSIS — G9340 Encephalopathy, unspecified: Secondary | ICD-10-CM | POA: Diagnosis not present

## 2022-06-27 ENCOUNTER — Telehealth: Payer: Self-pay

## 2022-06-27 ENCOUNTER — Other Ambulatory Visit: Payer: Self-pay | Admitting: Family Medicine

## 2022-06-27 DIAGNOSIS — G9340 Encephalopathy, unspecified: Secondary | ICD-10-CM | POA: Diagnosis not present

## 2022-06-27 NOTE — Telephone Encounter (Signed)
Declined refill requests for Dulera, folic acid, and zyprexa. Refills were already sent 1 week ago.

## 2022-06-27 NOTE — Patient Instructions (Signed)
Visit Information  Mr. Bos was given information about Medicaid Managed Care team care coordination services as a part of their [place] Medicaid benefit. Donnamarie Poag Mullin verbally consented to engagement with the [facility] Managed Care team.   If you are experiencing a medical emergency, please call 911 or report to your local emergency department or urgent care.   If you have a non-emergency medical problem during routine business hours, please contact your provider's office and ask to speak with a nurse.   For questions related to your [facility], please call: [phone] or visit the homepage here: [email or web address]  If you would like to schedule transportation through your [facility], please call the following number at least 2 days in advance of your appointment: [phone]   Rides for urgent appointments can also be made after hours by calling Member Services.  Call the [place] at [phone], at any time, 24 hours a day, 7 days a week. If you are in danger or need immediate medical attention call 911.  If you would like help to quit smoking, call 1-800-QUIT-NOW (864)443-3143) OR Espaol: 1-855-Djelo-Ya (6-811-572-6203) o para ms informacin haga clic aqu or Text READY to 200-400 to register via text  Mr. Helmes - following are the goals we discussed in your visit today:   Goals Addressed   None       Social Worker will follow up in 10 days .   [name], BSW, [place] Managed Medicaid Team  [phone]   Following is a copy of your plan of care:  There are no care plans that you recently modified to display for this patient.

## 2022-06-27 NOTE — Patient Outreach (Signed)
Medicaid Managed Care Social Work Note  06/27/2022 Name:  Timothy Hubbard MRN:  [id number] DOB:  08/11/1991  Timothy Hubbard is an 31 y.o. year old male who is a primary patient of Alcus Dad, MD.  The Medicaid Managed Care Coordination team was consulted for assistance with:  Community Resources   Mr. Kijowski was given information about Medicaid Managed Care Coordination team services today. Timothy Hubbard Primary Caregiver agreed to services and verbal consent obtained.  Engaged with patient  for by telephone forfollow up visit in response to referral for case management and/or care coordination services.   Assessments/Interventions:  Review of past medical history, allergies, medications, health status, including review of consultants reports, laboratory and other test data, was performed as part of comprehensive evaluation and provision of chronic care management services.  SDOH: (Social Determinant of Health) assessments and interventions performed: BSW completed a telephone outreach with patients mom. She stated she is trying to get legal guardianship of patient and is in need of a letter stating patients diagnosis and stating that he has issues with his speech and side. BSW informed mom she could not write the letter but would send patients PCP a message asking if this could be done. Mom stated a lawyer is coming to see her today and would need this information today. BSW informed mom to contact patients PCP office as well and request the letter. No other resources/services are needed at this time.   Advanced Directives Status:  Not addressed in this encounter.  Care Plan                 Allergies  Allergen Reactions   Ibuprofen Anaphylaxis    Medications Reviewed Today     Reviewed by Ezequiel Essex, MD (Resident) on 04/27/22 at 1105  Med List Status: <None>   Medication Order Taking? Sig Documenting Provider Last Dose Status Informant  acetaminophen (TYLENOL) 325 MG tablet  [id number] No Take 2 tablets (650 mg total) by mouth every 6 (six) hours as needed for mild pain or moderate pain. Zola Button, MD Taking Active   amLODipine (NORVASC) 10 MG tablet [id number] No Take 1 tablet (10 mg total) by mouth daily. Zola Button, MD Taking Active   divalproex (DEPAKOTE) 500 MG DR tablet [id number] No TAKE 1 TABLET(500 MG) BY MOUTH TWICE DAILY Zola Button, MD Taking Active   folic acid (FOLVITE) 1 MG tablet [id number] No Take 1 tablet (1 mg total) by mouth daily. Zola Button, MD Taking Active   hydrocortisone (ANUSOL-HC) 2.5 % rectal cream [id number] No Place 1 Application rectally 2 (two) times daily. Zola Button, MD Taking Active   lisinopril (ZESTRIL) 5 MG tablet [id number] No Take 1 tablet (5 mg total) by mouth daily. Zola Button, MD Taking Active   magnesium oxide (MAG-OX) 400 (240 Mg) MG tablet [id number]  Take 1 tablet by mouth 2 (two) times daily. [provider]  Active   mometasone-formoterol [facility]) 100-5 MCG/ACT AERO [id number] No Inhale 2 puffs into the lungs 2 (two) times daily. Zola Button, MD Taking Active   Multiple Vitamin (MULTIVITAMIN WITH MINERALS) TABS tablet [id number] No Take 1 tablet by mouth daily. Pokhrel, L[place], MD Taking Active   naltrexone (DEPADE) 50 MG tablet [id number]  Take 0.5 tablets (25 mg total) by mouth daily. Ezequiel Essex, MD  Active   nicotine (NICODERM CQ - DOSED IN MG/24 HOURS) 14 mg/24hr patch [id number] No Place 1 patch (14 mg total) onto the skin daily. Zola Button, MD Taking Active  OLANZapine (ZYPREXA) 5 MG tablet [id number] No Take 1 tablet (5 mg total) by mouth at bedtime. Zola Button, MD Taking Active   pantoprazole (PROTONIX) 40 MG tablet [id number] No Take 1 tablet (40 mg total) by mouth daily. Zola Button, MD Taking Active   polyethylene glycol (MIRALAX / GLYCOLAX) 17 g packet [id number] No Take 17 g by mouth daily as needed for moderate constipation. Zola Button, MD Taking Active   VENTOLIN HFA 108 [phone] Base) MCG/ACT  inhaler [id number]  Inhale into the lungs. [provider]  Active             Patient Active Problem List   Diagnosis Date Noted   Need for hepatitis C screening test 04/27/2022   Ambulatory dysfunction 04/06/2022   Mood disorder ([place]) 03/31/2022   Grade III internal hemorrhoids    Adenomatous polyp of descending colon    Iron deficiency anemia 03/04/2022   Seizure ([place])    Alcohol use disorder 10/21/2016   Gastroesophageal reflux disease 06/13/2016   Essential hypertension 04/01/2016   Asthma in adult, moderate persistent, with acute exacerbation 04/01/2016   TOBACCO USER 06/13/2009    Conditions to be addressed/monitored per PCP order:   community resources  There are no care plans that you recently modified to display for this patient.   Follow up:  Patient agrees to Care Plan and Follow-up.  Plan: The Managed Medicaid care management team will reach out to the patient again over the next 10 days.  Date/time of next scheduled Social Work care management/care coordination outreach:  07/11/22  [name], Arita Miss, [place] Medicaid Team  [phone]

## 2022-06-28 DIAGNOSIS — G9340 Encephalopathy, unspecified: Secondary | ICD-10-CM | POA: Diagnosis not present

## 2022-06-29 DIAGNOSIS — G9340 Encephalopathy, unspecified: Secondary | ICD-10-CM | POA: Diagnosis not present

## 2022-06-30 DIAGNOSIS — G9340 Encephalopathy, unspecified: Secondary | ICD-10-CM | POA: Diagnosis not present

## 2022-07-01 DIAGNOSIS — G9340 Encephalopathy, unspecified: Secondary | ICD-10-CM | POA: Diagnosis not present

## 2022-07-02 DIAGNOSIS — G9340 Encephalopathy, unspecified: Secondary | ICD-10-CM | POA: Diagnosis not present

## 2022-07-03 DIAGNOSIS — G9340 Encephalopathy, unspecified: Secondary | ICD-10-CM | POA: Diagnosis not present

## 2022-07-04 DIAGNOSIS — G9340 Encephalopathy, unspecified: Secondary | ICD-10-CM | POA: Diagnosis not present

## 2022-07-05 DIAGNOSIS — G9340 Encephalopathy, unspecified: Secondary | ICD-10-CM | POA: Diagnosis not present

## 2022-07-06 DIAGNOSIS — G9340 Encephalopathy, unspecified: Secondary | ICD-10-CM | POA: Diagnosis not present

## 2022-07-06 NOTE — Progress Notes (Deleted)
    SUBJECTIVE:   CHIEF COMPLAINT / HPI:   ***  Iron Deficiency Anemia On iron supplementation Dr Jeani Hawking recommended repeating anemia labs at last visit 2 months ago  Needs letter for courts: Stating diagnosis Issues with speech and side  PERTINENT  PMH / PSH: alcohol use disorder, HTN, GERD, mood disorder, seizure  OBJECTIVE:   There were no vitals taken for this visit.  ***  ASSESSMENT/PLAN:   No problem-specific Assessment & Plan notes found for this encounter.     Alcus Dad, MD [place]

## 2022-07-07 DIAGNOSIS — G9340 Encephalopathy, unspecified: Secondary | ICD-10-CM | POA: Diagnosis not present

## 2022-07-08 ENCOUNTER — Ambulatory Visit: Payer: Medicaid Other | Admitting: Family Medicine

## 2022-07-08 DIAGNOSIS — G9340 Encephalopathy, unspecified: Secondary | ICD-10-CM | POA: Diagnosis not present

## 2022-07-09 DIAGNOSIS — G9340 Encephalopathy, unspecified: Secondary | ICD-10-CM | POA: Diagnosis not present

## 2022-07-10 DIAGNOSIS — G9340 Encephalopathy, unspecified: Secondary | ICD-10-CM | POA: Diagnosis not present

## 2022-07-11 ENCOUNTER — Other Ambulatory Visit: Payer: Self-pay | Admitting: Family Medicine

## 2022-07-11 ENCOUNTER — Other Ambulatory Visit: Payer: Self-pay

## 2022-07-11 DIAGNOSIS — G9340 Encephalopathy, unspecified: Secondary | ICD-10-CM | POA: Diagnosis not present

## 2022-07-11 NOTE — Patient Outreach (Signed)
  Medicaid Managed Care   Unsuccessful Outreach Note  07/11/2022 Name: Timothy Hubbard MRN: [id number] DOB: [birth year]  Referred by: Alcus Dad, MD Reason for referral : High Risk Managed Medicaid (MM social work telephone outreach)   An unsuccessful telephone outreach was attempted today. The patient was referred to the case management team for assistance with care management and care coordination.   Follow Up Plan: A HIPAA compliant phone message was left for the patient providing contact information and requesting a return call.   [name], BSW, [place] Managed Medicaid Team  [phone]

## 2022-07-11 NOTE — Patient Instructions (Signed)
  Medicaid Managed Care   Unsuccessful Outreach Note  07/11/2022 Name: Timothy Hubbard MRN: [id number] DOB: 1991-06-09  Referred by: Alcus Dad, MD Reason for referral : High Risk Managed Medicaid (MM social work telephone outreach)   An unsuccessful telephone outreach was attempted today. The patient was referred to the case management team for assistance with care management and care coordination.   Follow Up Plan: A HIPAA compliant phone message was left for the patient providing contact information and requesting a return call.   [name], BSW, [place] Managed Medicaid Team  [phone]

## 2022-07-12 DIAGNOSIS — G9340 Encephalopathy, unspecified: Secondary | ICD-10-CM | POA: Diagnosis not present

## 2022-07-12 NOTE — Telephone Encounter (Signed)
Declined refill request for patient's medications. He has no-showed his last several appointments. Has upcoming office visit in 2 days-- can refill as appropriate if he attends that appointment.   Alcus Dad, MD PGY-3, [place]

## 2022-07-13 DIAGNOSIS — G9340 Encephalopathy, unspecified: Secondary | ICD-10-CM | POA: Diagnosis not present

## 2022-07-13 NOTE — Progress Notes (Signed)
    SUBJECTIVE:   CHIEF COMPLAINT / HPI:   [name] is a pleasant 31 year old man who presents today with his mom for follow-up and request for increase of personal care services.  He was hospitalized 6/27 through 7/6 for acute metabolic encephalopathy and seizure-like activity.  CT and MRI of the brain were negative, EEG unremarkable, CTA head and neck negative. Eventually discharged to [facility] for inpatient rehabilitation.  Etiology of these seizures was unclear, but thought to be possibly provoked by polypharmacy or EtOH cessation. His discharge diagnoses include chronic alcohol abuse, acute encephalopathy, ambulatory dysfunction, dysphagia, and iron deficiency anemia.  Sequela from his acute encephalopathy and seizure-like activity render him unable to make decisions on his own.  Today mom reports patient is unable to prepare food for himself, take his own medications.  Needs a lot of hands-on help.  Today, mom reports they have not been for physical recreational therapy since discharge from rehab.  Would like connection rehab with potential for pool therapy.  Mom reports patient is unable to make food for himself or take his own medicine, she manages that for him.  She works full-time and could use more PCS hours, at least until she gets home.  She is concerned about him being at home alone.  He also has a persistent cough that she would like me to evaluate.  PERTINENT  PMH / PSH:  Patient Active Problem List   Diagnosis Date Noted   Post-nasal drip 07/15/2022   Need for hepatitis C screening test 04/27/2022   Ambulatory dysfunction 04/06/2022   Mood disorder ([place]) 03/31/2022   Grade III internal hemorrhoids    Adenomatous polyp of descending colon    Iron deficiency anemia 03/04/2022   Seizure ([place])    Alcohol use disorder 10/21/2016   Gastroesophageal reflux disease 06/13/2016   Essential hypertension 04/01/2016   Asthma in adult, moderate persistent, with acute exacerbation  04/01/2016   TOBACCO USER 06/13/2009    OBJECTIVE:   BP 130/88   Pulse (!) 110   Ht '5\' 4"'$  (1.626 m)   Wt 131 lb 6.4 oz (59.6 kg)   SpO2 98%   BMI 22.55 kg/m    General: Withdrawn, quietly sitting in chair, minimally interactive, eyes cast down Cardiac: Regular rate and rhythm, no murmurs Respiratory: CTAB, no wheezes or rhonchi MSK: Walking on own, followed by mother, has gait belt  ASSESSMENT/PLAN:   Ambulatory dysfunction Mother requests increasing hours of PCS services given her work schedule.  She is nervous about him being home alone, given his ambulatory dysfunction and new cognitive limitations.  Cannot perform IADLs and needs hands-on assistance with ADLs.  We will submit forms.  Iron deficiency anemia Declined CBC check today.  Continue iron daily, next recheck as soon as patient amenable.  Seizure ([place]) No seizure activity since discharging home.  Continue Depakote sprinkles as prescribed.  Post-nasal drip No evidence of asthma, COPD, or pneumonia.  Believe his chronic cough caused by postnasal drip secondary to allergic rhinitis.  Recommend starting with Flonase and if needs further improvement, can add second generation antihistamine such as Zyrtec or Allegra. Return precautions given.     Ezequiel Essex, MD [place]

## 2022-07-14 ENCOUNTER — Encounter: Payer: Self-pay | Admitting: Family Medicine

## 2022-07-14 ENCOUNTER — Ambulatory Visit (INDEPENDENT_AMBULATORY_CARE_PROVIDER_SITE_OTHER): Payer: Medicaid Other | Admitting: Family Medicine

## 2022-07-14 VITALS — BP 130/88 | HR 110 | Ht 64.0 in | Wt 131.4 lb

## 2022-07-14 DIAGNOSIS — R262 Difficulty in walking, not elsewhere classified: Secondary | ICD-10-CM | POA: Diagnosis present

## 2022-07-14 DIAGNOSIS — Z789 Other specified health status: Secondary | ICD-10-CM

## 2022-07-14 DIAGNOSIS — Z741 Need for assistance with personal care: Secondary | ICD-10-CM

## 2022-07-14 DIAGNOSIS — R4189 Other symptoms and signs involving cognitive functions and awareness: Secondary | ICD-10-CM | POA: Diagnosis not present

## 2022-07-14 DIAGNOSIS — R0982 Postnasal drip: Secondary | ICD-10-CM | POA: Diagnosis not present

## 2022-07-14 DIAGNOSIS — D509 Iron deficiency anemia, unspecified: Secondary | ICD-10-CM

## 2022-07-14 DIAGNOSIS — G9340 Encephalopathy, unspecified: Secondary | ICD-10-CM | POA: Diagnosis not present

## 2022-07-14 DIAGNOSIS — R569 Unspecified convulsions: Secondary | ICD-10-CM | POA: Diagnosis not present

## 2022-07-14 MED ORDER — FLUTICASONE PROPIONATE 50 MCG/ACT NA SUSP: 2.0000 | Freq: Every day | NASAL | 6 refills | Status: DC

## 2022-07-14 MED ORDER — DIVALPROEX SODIUM 500 MG PO DR TAB: DELAYED_RELEASE_TABLET | ORAL | 0 refills | Status: DC

## 2022-07-14 NOTE — Patient Instructions (Signed)
It was wonderful to see you today. Thank you for allowing me to be a part of your care. Below is a short summary of what we discussed at your visit today:  Therapy I have placed referrals back for physical and Occupational Therapy.  I mentioned that you would like pool therapy.  Please let us know if you have not heard from anybody in 2 weeks.  Cough I think this is related to postnasal drip from seasonal allergies.  If there is no evidence of pneumonia or asthma today.  Start with Flonase 2 sprays each nostril at bedtime.  Use every day at bedtime for at least 1 to 2 weeks to see a difference.  Remember, insert sprayer into nostril and aim for ear.   Personal care services Will fill out and fax the form by end of business today.  Seizures Your Depakote medicine should be 500 mg (1 tablet) twice a day.  Please bring all of your medications to every appointment!  If you have any questions or concerns, please do not hesitate to contact us via phone or MyChart message.   Ezequiel Essex, MD

## 2022-07-15 ENCOUNTER — Encounter: Payer: Self-pay | Admitting: Family Medicine

## 2022-07-15 ENCOUNTER — Other Ambulatory Visit: Payer: Self-pay

## 2022-07-15 DIAGNOSIS — Z741 Need for assistance with personal care: Secondary | ICD-10-CM | POA: Insufficient documentation

## 2022-07-15 DIAGNOSIS — R0982 Postnasal drip: Secondary | ICD-10-CM | POA: Insufficient documentation

## 2022-07-15 DIAGNOSIS — R4189 Other symptoms and signs involving cognitive functions and awareness: Secondary | ICD-10-CM | POA: Insufficient documentation

## 2022-07-15 DIAGNOSIS — Z789 Other specified health status: Secondary | ICD-10-CM | POA: Insufficient documentation

## 2022-07-15 DIAGNOSIS — D509 Iron deficiency anemia, unspecified: Secondary | ICD-10-CM

## 2022-07-15 DIAGNOSIS — F1090 Alcohol use, unspecified, uncomplicated: Secondary | ICD-10-CM

## 2022-07-15 DIAGNOSIS — G9340 Encephalopathy, unspecified: Secondary | ICD-10-CM | POA: Diagnosis not present

## 2022-07-15 MED ORDER — DULERA 100-5 MCG/ACT IN AERO: INHALATION_SPRAY | RESPIRATORY_TRACT | 0 refills | Status: DC

## 2022-07-15 MED ORDER — OLANZAPINE 5 MG PO TABS: 5.0000 mg | ORAL_TABLET | Freq: Every day | ORAL | 0 refills | Status: DC

## 2022-07-15 MED ORDER — FERROUS SULFATE 324 (65 FE) MG PO TBEC: 1.0000 | DELAYED_RELEASE_TABLET | Freq: Every day | ORAL | 3 refills | Status: DC

## 2022-07-15 MED ORDER — PANTOPRAZOLE SODIUM 40 MG PO TBEC: 40.0000 mg | DELAYED_RELEASE_TABLET | Freq: Every day | ORAL | 0 refills | Status: DC

## 2022-07-15 MED ORDER — NALTREXONE HCL 50 MG PO TABS: 25.0000 mg | ORAL_TABLET | Freq: Every day | ORAL | 0 refills | Status: DC

## 2022-07-15 MED ORDER — POLYETHYLENE GLYCOL 3350 17 G PO PACK: PACK | ORAL | 0 refills | Status: DC

## 2022-07-15 MED ORDER — FOLIC ACID 1 MG PO TABS: 1.0000 mg | ORAL_TABLET | Freq: Every day | ORAL | 0 refills | Status: DC

## 2022-07-15 MED ORDER — LISINOPRIL 5 MG PO TABS: 5.0000 mg | ORAL_TABLET | Freq: Every day | ORAL | 0 refills | Status: DC

## 2022-07-15 NOTE — Telephone Encounter (Signed)
Patients mother calls nurse line reporting a pharmacy change and new orders for "all" medications.   She reports they are using [facility]. Mother would not go through list, as we have over ten medications for him. She stated "ALL OF THEM." Advised her I will forward the medications we prescribe to PCP.

## 2022-07-15 NOTE — Progress Notes (Signed)
PCS care services form placed in outgoing faxes basket 11/10 at 6pm.  Ezequiel Essex, MD

## 2022-07-15 NOTE — Assessment & Plan Note (Signed)
No seizure activity since discharging home.  Continue Depakote sprinkles as prescribed.

## 2022-07-15 NOTE — Assessment & Plan Note (Signed)
Declined CBC check today.  Continue iron daily, next recheck as soon as patient amenable.

## 2022-07-15 NOTE — Assessment & Plan Note (Addendum)
No evidence of asthma, COPD, or pneumonia.  Believe his chronic cough caused by postnasal drip secondary to allergic rhinitis.  Recommend starting with Flonase and if needs further improvement, can add second generation antihistamine such as Zyrtec or Allegra. Return precautions given.

## 2022-07-15 NOTE — Assessment & Plan Note (Signed)
Mother requests increasing hours of PCS services given her work schedule.  She is nervous about him being home alone, given his ambulatory dysfunction and new cognitive limitations.  Cannot perform IADLs and needs hands-on assistance with ADLs.  We will submit forms.

## 2022-07-16 DIAGNOSIS — G9340 Encephalopathy, unspecified: Secondary | ICD-10-CM | POA: Diagnosis not present

## 2022-07-17 DIAGNOSIS — G9340 Encephalopathy, unspecified: Secondary | ICD-10-CM | POA: Diagnosis not present

## 2022-07-18 DIAGNOSIS — G9340 Encephalopathy, unspecified: Secondary | ICD-10-CM | POA: Diagnosis not present

## 2022-07-19 ENCOUNTER — Telehealth: Payer: Self-pay

## 2022-07-19 DIAGNOSIS — G9340 Encephalopathy, unspecified: Secondary | ICD-10-CM | POA: Diagnosis not present

## 2022-07-19 DIAGNOSIS — J4541 Moderate persistent asthma with (acute) exacerbation: Secondary | ICD-10-CM

## 2022-07-19 NOTE — Telephone Encounter (Signed)
Patient's mother calls nurse line requesting DME nebulizer and albuterol solution.  She states that she was told she could get nebulizer from [employer]. She would like these to be sent to [facility].   Mother is also asking about additional vitamins/supplements that would be good for patient. She is interested in Vitamin K supplements, Zinc, Magnesium oxide and Calcium supplements.   Will forward to PCP for further advisement.   Please route back to RN team if DME order is placed.   Talbot Grumbling, RN

## 2022-07-20 DIAGNOSIS — G9340 Encephalopathy, unspecified: Secondary | ICD-10-CM | POA: Diagnosis not present

## 2022-07-21 DIAGNOSIS — G9340 Encephalopathy, unspecified: Secondary | ICD-10-CM | POA: Diagnosis not present

## 2022-07-22 DIAGNOSIS — G9340 Encephalopathy, unspecified: Secondary | ICD-10-CM | POA: Diagnosis not present

## 2022-07-23 DIAGNOSIS — G9340 Encephalopathy, unspecified: Secondary | ICD-10-CM | POA: Diagnosis not present

## 2022-07-24 DIAGNOSIS — G9340 Encephalopathy, unspecified: Secondary | ICD-10-CM | POA: Diagnosis not present

## 2022-07-25 ENCOUNTER — Ambulatory Visit: Payer: Medicaid Other | Admitting: Physical Therapy

## 2022-07-25 DIAGNOSIS — G9340 Encephalopathy, unspecified: Secondary | ICD-10-CM | POA: Diagnosis not present

## 2022-07-25 MED ORDER — ALBUTEROL SULFATE (2.5 MG/3ML) 0.083% IN NEBU: 2.5000 mg | INHALATION_SOLUTION | Freq: Four times a day (QID) | RESPIRATORY_TRACT | 1 refills | Status: DC | PRN

## 2022-07-25 NOTE — Telephone Encounter (Signed)
DME orders placed for nebulizer and albuterol solution.  Would not specifically recommend any vitamins other than what's already prescribed for him: iron, folic acid, magnesium oxide, thiamine (B1), and a multivitamin. Needs office visit if they wish to discuss further.

## 2022-07-26 DIAGNOSIS — G9340 Encephalopathy, unspecified: Secondary | ICD-10-CM | POA: Diagnosis not present

## 2022-07-27 DIAGNOSIS — G9340 Encephalopathy, unspecified: Secondary | ICD-10-CM | POA: Diagnosis not present

## 2022-07-27 NOTE — Telephone Encounter (Signed)
Called and spoke with pharmacist at [facility]. He reports that they are only contracted with regular [place] Medicaid. He states that he can attempt to run the claim, however, could take up to 10 days for a response.   Called mother and informed. She is requesting that we send order to [employer].   Community message sent to [employer].   Talbot Grumbling, RN

## 2022-07-27 NOTE — Telephone Encounter (Signed)
Called mother and informed of message from Dr. Rock Nephew.   Attempted multiple times to reach [facility] yesterday, there phones were not working.   Will attempt to call again this AM to determine what is needed for nebulizer order.    Talbot Grumbling, RN

## 2022-07-27 NOTE — Telephone Encounter (Signed)
Receipt confirmed by [employer].   Karema Tocci C Adalis Gatti, RN  

## 2022-07-28 DIAGNOSIS — G9340 Encephalopathy, unspecified: Secondary | ICD-10-CM | POA: Diagnosis not present

## 2022-07-29 DIAGNOSIS — G9340 Encephalopathy, unspecified: Secondary | ICD-10-CM | POA: Diagnosis not present

## 2022-07-30 DIAGNOSIS — G9340 Encephalopathy, unspecified: Secondary | ICD-10-CM | POA: Diagnosis not present

## 2022-07-31 DIAGNOSIS — G9340 Encephalopathy, unspecified: Secondary | ICD-10-CM | POA: Diagnosis not present

## 2022-08-01 DIAGNOSIS — G9340 Encephalopathy, unspecified: Secondary | ICD-10-CM | POA: Diagnosis not present

## 2022-08-01 NOTE — Telephone Encounter (Signed)
Nebulizer order sent to [employer]. They need office visit notes stating the need for equipment. Patient was seen by Dr. Jeani Hawking on 07/14/22. Please advise if office visit note can be addended or if patient will need new visit to discuss this need.   Talbot Grumbling, RN

## 2022-08-02 DIAGNOSIS — G9340 Encephalopathy, unspecified: Secondary | ICD-10-CM | POA: Diagnosis not present

## 2022-08-03 DIAGNOSIS — G9340 Encephalopathy, unspecified: Secondary | ICD-10-CM | POA: Diagnosis not present

## 2022-08-03 NOTE — Telephone Encounter (Signed)
Attempted to call mother to advise of needed appointment. She did not answer. LVM asking mother to return call to office.   Talbot Grumbling, RN

## 2022-08-04 DIAGNOSIS — G9340 Encephalopathy, unspecified: Secondary | ICD-10-CM | POA: Diagnosis not present

## 2022-08-05 DIAGNOSIS — G9340 Encephalopathy, unspecified: Secondary | ICD-10-CM | POA: Diagnosis not present

## 2022-08-06 DIAGNOSIS — G9340 Encephalopathy, unspecified: Secondary | ICD-10-CM | POA: Diagnosis not present

## 2022-08-07 DIAGNOSIS — G9340 Encephalopathy, unspecified: Secondary | ICD-10-CM | POA: Diagnosis not present

## 2022-08-08 DIAGNOSIS — G9340 Encephalopathy, unspecified: Secondary | ICD-10-CM | POA: Diagnosis not present

## 2022-08-09 DIAGNOSIS — G9340 Encephalopathy, unspecified: Secondary | ICD-10-CM | POA: Diagnosis not present

## 2022-08-10 ENCOUNTER — Other Ambulatory Visit: Payer: Self-pay | Admitting: Family Medicine

## 2022-08-10 DIAGNOSIS — G9340 Encephalopathy, unspecified: Secondary | ICD-10-CM | POA: Diagnosis not present

## 2022-08-10 DIAGNOSIS — R569 Unspecified convulsions: Secondary | ICD-10-CM

## 2022-08-10 DIAGNOSIS — R262 Difficulty in walking, not elsewhere classified: Secondary | ICD-10-CM

## 2022-08-11 DIAGNOSIS — G9340 Encephalopathy, unspecified: Secondary | ICD-10-CM | POA: Diagnosis not present

## 2022-08-12 ENCOUNTER — Other Ambulatory Visit: Payer: Self-pay | Admitting: Family Medicine

## 2022-08-12 DIAGNOSIS — G9340 Encephalopathy, unspecified: Secondary | ICD-10-CM | POA: Diagnosis not present

## 2022-08-12 DIAGNOSIS — R262 Difficulty in walking, not elsewhere classified: Secondary | ICD-10-CM

## 2022-08-12 DIAGNOSIS — R569 Unspecified convulsions: Secondary | ICD-10-CM

## 2022-08-13 DIAGNOSIS — G9340 Encephalopathy, unspecified: Secondary | ICD-10-CM | POA: Diagnosis not present

## 2022-08-14 DIAGNOSIS — G9340 Encephalopathy, unspecified: Secondary | ICD-10-CM | POA: Diagnosis not present

## 2022-08-15 DIAGNOSIS — G9340 Encephalopathy, unspecified: Secondary | ICD-10-CM | POA: Diagnosis not present

## 2022-08-16 DIAGNOSIS — G9340 Encephalopathy, unspecified: Secondary | ICD-10-CM | POA: Diagnosis not present

## 2022-08-17 DIAGNOSIS — G9340 Encephalopathy, unspecified: Secondary | ICD-10-CM | POA: Diagnosis not present

## 2022-08-18 DIAGNOSIS — G9340 Encephalopathy, unspecified: Secondary | ICD-10-CM | POA: Diagnosis not present

## 2022-08-19 DIAGNOSIS — G9340 Encephalopathy, unspecified: Secondary | ICD-10-CM | POA: Diagnosis not present

## 2022-08-20 DIAGNOSIS — G9340 Encephalopathy, unspecified: Secondary | ICD-10-CM | POA: Diagnosis not present

## 2022-08-21 DIAGNOSIS — G9340 Encephalopathy, unspecified: Secondary | ICD-10-CM | POA: Diagnosis not present

## 2022-08-22 ENCOUNTER — Other Ambulatory Visit: Payer: Self-pay | Admitting: Family Medicine

## 2022-08-22 DIAGNOSIS — G9340 Encephalopathy, unspecified: Secondary | ICD-10-CM | POA: Diagnosis not present

## 2022-08-22 MED ORDER — AMLODIPINE BESYLATE 10 MG PO TABS: 10.0000 mg | ORAL_TABLET | Freq: Every day | ORAL | 0 refills | Status: DC

## 2022-08-23 DIAGNOSIS — G9340 Encephalopathy, unspecified: Secondary | ICD-10-CM | POA: Diagnosis not present

## 2022-08-24 DIAGNOSIS — G9340 Encephalopathy, unspecified: Secondary | ICD-10-CM | POA: Diagnosis not present

## 2022-08-25 DIAGNOSIS — G9340 Encephalopathy, unspecified: Secondary | ICD-10-CM | POA: Diagnosis not present

## 2022-08-26 DIAGNOSIS — G9340 Encephalopathy, unspecified: Secondary | ICD-10-CM | POA: Diagnosis not present

## 2022-08-27 DIAGNOSIS — G9340 Encephalopathy, unspecified: Secondary | ICD-10-CM | POA: Diagnosis not present

## 2022-08-28 DIAGNOSIS — G9340 Encephalopathy, unspecified: Secondary | ICD-10-CM | POA: Diagnosis not present

## 2022-08-29 DIAGNOSIS — G9340 Encephalopathy, unspecified: Secondary | ICD-10-CM | POA: Diagnosis not present

## 2022-08-30 DIAGNOSIS — G9340 Encephalopathy, unspecified: Secondary | ICD-10-CM | POA: Diagnosis not present

## 2022-08-31 ENCOUNTER — Ambulatory Visit: Payer: Medicaid Other | Admitting: Family Medicine

## 2022-08-31 DIAGNOSIS — G9340 Encephalopathy, unspecified: Secondary | ICD-10-CM | POA: Diagnosis not present

## 2022-08-31 NOTE — Progress Notes (Deleted)
    SUBJECTIVE:   CHIEF COMPLAINT / HPI:   Discuss asthma/nebulizer? ***  PERTINENT  PMH / PSH: ***  OBJECTIVE:   There were no vitals taken for this visit.  ***  ASSESSMENT/PLAN:   No problem-specific Assessment & Plan notes found for this encounter.     Alcus Dad, MD [place]

## 2022-09-01 DIAGNOSIS — G9340 Encephalopathy, unspecified: Secondary | ICD-10-CM | POA: Diagnosis not present

## 2022-09-02 DIAGNOSIS — G9340 Encephalopathy, unspecified: Secondary | ICD-10-CM | POA: Diagnosis not present

## 2022-09-03 DIAGNOSIS — G9340 Encephalopathy, unspecified: Secondary | ICD-10-CM | POA: Diagnosis not present

## 2022-09-04 DIAGNOSIS — G9340 Encephalopathy, unspecified: Secondary | ICD-10-CM | POA: Diagnosis not present

## 2022-09-05 DIAGNOSIS — G9340 Encephalopathy, unspecified: Secondary | ICD-10-CM | POA: Diagnosis not present

## 2022-09-06 DIAGNOSIS — G9340 Encephalopathy, unspecified: Secondary | ICD-10-CM | POA: Diagnosis not present

## 2022-09-07 DIAGNOSIS — G9340 Encephalopathy, unspecified: Secondary | ICD-10-CM | POA: Diagnosis not present

## 2022-09-08 DIAGNOSIS — G9340 Encephalopathy, unspecified: Secondary | ICD-10-CM | POA: Diagnosis not present

## 2022-09-09 ENCOUNTER — Encounter: Payer: Self-pay | Admitting: Family Medicine

## 2022-09-09 ENCOUNTER — Ambulatory Visit (INDEPENDENT_AMBULATORY_CARE_PROVIDER_SITE_OTHER): Payer: Medicaid Other | Admitting: Family Medicine

## 2022-09-09 VITALS — BP 100/65 | HR 108 | Temp 98.4°F | Ht 64.0 in | Wt 130.2 lb

## 2022-09-09 DIAGNOSIS — R262 Difficulty in walking, not elsewhere classified: Secondary | ICD-10-CM | POA: Diagnosis not present

## 2022-09-09 DIAGNOSIS — G9340 Encephalopathy, unspecified: Secondary | ICD-10-CM | POA: Diagnosis not present

## 2022-09-09 DIAGNOSIS — D509 Iron deficiency anemia, unspecified: Secondary | ICD-10-CM

## 2022-09-09 DIAGNOSIS — J4541 Moderate persistent asthma with (acute) exacerbation: Secondary | ICD-10-CM | POA: Diagnosis not present

## 2022-09-09 DIAGNOSIS — R000 Tachycardia, unspecified: Secondary | ICD-10-CM | POA: Diagnosis not present

## 2022-09-09 MED ORDER — NICOTINE 14 MG/24HR TD PT24: 14.0000 mg | MEDICATED_PATCH | Freq: Every day | TRANSDERMAL | 0 refills | Status: DC

## 2022-09-09 NOTE — Progress Notes (Signed)
    SUBJECTIVE:   CHIEF COMPLAINT / HPI:   Handicap placard paperwork -Mom requests paperwork be completed for him to get a handicap placard -Has had ambulatory dysfunction ever since his hospitalization in June 2023 (required ICU admission, CIR stay)  PCS hours -Mom requesting additional PCS hours -Mom works full time -Patient unsafe to be alone due to ambulatory dysfunction/balance issues as well as cognitive problems -Form was completed by Dr Jeani Hawking but apparently needs to be re-sent  Discuss Nebulizer -uses his Mom's nebulizer when needed, requests his own machine -uses his albuterol infrequently, only with URIs -reports adherence to Dulera BID -diagnosis of asthma although no formal spirometry -continues to smoke  URI -patient with cough and congestion this week -Mom sick with similar symptoms -She's wondering what OTC meds are safe for him to take -No fever, SOB or chest pain  PERTINENT  PMH / PSH: seizure disorder, HTN, asthma, tobacco use, alcohol use  OBJECTIVE:   BP 100/65   Pulse (!) 108   Temp 98.4 F (36.9 C)   Ht '5\' 4"'$  (1.626 m)   Wt 130 lb 3.2 oz (59.1 kg)   SpO2 99%   BMI 22.35 kg/m   Gen: alert, NAD, answers basic questions but otherwise does not interact much HEENT: nares patent, oropharynx unremarkable Cardiac: tachycardic, regular rhythm, normal S1/S2 without murmur Resp: no increased WOB, lungs CTAB Neuro: moves all extremities equally, 5-/5 strength in bilateral lower extremities, 5/5 strength in b/l upper extremities, has gait belt in place, able to ambulate slowly on his own but appears somewhat unsteady  ASSESSMENT/PLAN:   Ambulatory dysfunction Chronic ever since hospitalization for seizures and encephalopathy in June 2023. Requires 24 hour supervision due to increased fall risk as well as cognitive issues.  -paperwork completed for handicap placard and given to Mom -will re-submit PCS forms for additional hours -not currently in PT,  interested in pool therapy-- they will discuss options for pool therapy with neurologist at upcoming appointment. If unable to do pool therapy, recommend home health PT  Asthma Well controlled with Dulera and prn albuterol use. Not currently in exacerbation. -orders placed for DME nebulizer -consider formal PFTs in the future  Iron deficiency anemia H/o iron deficiency anemia, required transfusion during admission in July 2023, had colonoscopy showing hemorrhoids. Poor compliance with iron supplementation. -Recheck CBC, iron, TIBC, ferritin today  Tachycardia Noted at multiple prior visits as well. May be related to his anemia as well as chronic alcohol use. Check CBC and iron studies as above. Could consider beta blocker at future visit.   Viral URI Mild and resolving. -Advised supportive care with honey, guaifenesin as needed  Alcus Dad, MD [place]

## 2022-09-09 NOTE — Patient Instructions (Signed)
It was great to see you!  -I have completed the forms for a handicap placard -I have ordered a nebulizer machine. Someone will reach out to you with details on delivery/pickup of the nebulizer -I sent refills on your nicotine patches to your pharmacy. -You can take Mucinex (guaifenesin) for your current congestion/cough. 2 tablespoons of honey a few times per day can also be very helpful for cough. -I will re-fax the forms that Dr Jeani Hawking sent for increased hours of personal care services -We are checking his iron levels today. I will call you with these results.  Take care, Dr Rock Nephew

## 2022-09-10 DIAGNOSIS — R000 Tachycardia, unspecified: Secondary | ICD-10-CM | POA: Insufficient documentation

## 2022-09-10 DIAGNOSIS — G9340 Encephalopathy, unspecified: Secondary | ICD-10-CM | POA: Diagnosis not present

## 2022-09-10 LAB — IRON,TIBC AND FERRITIN PANEL
Ferritin: 80 ng/mL (ref 30–400)
Iron Saturation: 22 % (ref 15–55)
Iron: 92 ug/dL (ref 38–169)
Total Iron Binding Capacity: 410 ug/dL (ref 250–450)
UIBC: 318 ug/dL (ref 111–343)

## 2022-09-10 LAB — CBC
Hematocrit: 28.8 % — ABNORMAL LOW (ref 37.5–51.0)
Hemoglobin: 10 g/dL — ABNORMAL LOW (ref 13.0–17.7)
MCH: 30.1 pg (ref 26.6–33.0)
MCHC: 34.7 g/dL (ref 31.5–35.7)
MCV: 87 fL (ref 79–97)
Platelets: 257 10*3/uL (ref 150–450)
RBC: 3.32 x10E6/uL — ABNORMAL LOW (ref 4.14–5.80)
RDW: 14.6 % (ref 11.6–15.4)
WBC: 6.5 10*3/uL (ref 3.4–10.8)

## 2022-09-10 NOTE — Assessment & Plan Note (Addendum)
H/o iron deficiency anemia, required transfusion during admission in July 2023, had colonoscopy showing hemorrhoids. Poor compliance with iron supplementation. -Recheck CBC, iron, TIBC, ferritin today

## 2022-09-10 NOTE — Assessment & Plan Note (Addendum)
Chronic ever since hospitalization for seizures and encephalopathy in June 2023. Requires 24 hour supervision due to increased fall risk as well as cognitive issues.  -paperwork completed for handicap placard and given to Mom -will re-submit PCS forms for additional hours -not currently in PT, interested in pool therapy-- they will discuss options for pool therapy with neurologist at upcoming appointment. If unable to do pool therapy, recommend home health PT

## 2022-09-10 NOTE — Assessment & Plan Note (Signed)
Noted at multiple prior visits as well. May be related to his anemia as well as chronic alcohol use. Check CBC and iron studies as above. Could consider beta blocker at future visit.

## 2022-09-10 NOTE — Assessment & Plan Note (Addendum)
Well controlled with Dulera and prn albuterol use. Not currently in exacerbation. -orders placed for DME nebulizer -consider formal PFTs in the future

## 2022-09-11 DIAGNOSIS — G9340 Encephalopathy, unspecified: Secondary | ICD-10-CM | POA: Diagnosis not present

## 2022-09-12 DIAGNOSIS — G9340 Encephalopathy, unspecified: Secondary | ICD-10-CM | POA: Diagnosis not present

## 2022-09-13 ENCOUNTER — Encounter: Payer: Self-pay | Admitting: Family Medicine

## 2022-09-13 DIAGNOSIS — G9340 Encephalopathy, unspecified: Secondary | ICD-10-CM | POA: Diagnosis not present

## 2022-09-14 DIAGNOSIS — G9340 Encephalopathy, unspecified: Secondary | ICD-10-CM | POA: Diagnosis not present

## 2022-09-15 DIAGNOSIS — G9340 Encephalopathy, unspecified: Secondary | ICD-10-CM | POA: Diagnosis not present

## 2022-09-16 DIAGNOSIS — G9340 Encephalopathy, unspecified: Secondary | ICD-10-CM | POA: Diagnosis not present

## 2022-09-17 DIAGNOSIS — G9340 Encephalopathy, unspecified: Secondary | ICD-10-CM | POA: Diagnosis not present

## 2022-09-18 DIAGNOSIS — G9340 Encephalopathy, unspecified: Secondary | ICD-10-CM | POA: Diagnosis not present

## 2022-09-19 DIAGNOSIS — G9340 Encephalopathy, unspecified: Secondary | ICD-10-CM | POA: Diagnosis not present

## 2022-09-20 DIAGNOSIS — G9340 Encephalopathy, unspecified: Secondary | ICD-10-CM | POA: Diagnosis not present

## 2022-09-21 ENCOUNTER — Telehealth: Payer: Self-pay | Admitting: *Deleted

## 2022-09-21 DIAGNOSIS — G9340 Encephalopathy, unspecified: Secondary | ICD-10-CM | POA: Diagnosis not present

## 2022-09-21 NOTE — Telephone Encounter (Signed)
Received voicemail from [place] with [employer] inquiring about PCS renewal forms that were faxed on 09/01/22.  She is asking that they be completed and faxed back to her. If you did not receive them, please call her at [phone] to let her know and they will be resent.  Thanks [employer]

## 2022-09-22 DIAGNOSIS — G9340 Encephalopathy, unspecified: Secondary | ICD-10-CM | POA: Diagnosis not present

## 2022-09-23 ENCOUNTER — Other Ambulatory Visit: Payer: Self-pay | Admitting: Family Medicine

## 2022-09-23 DIAGNOSIS — G9340 Encephalopathy, unspecified: Secondary | ICD-10-CM | POA: Diagnosis not present

## 2022-09-23 DIAGNOSIS — R262 Difficulty in walking, not elsewhere classified: Secondary | ICD-10-CM

## 2022-09-23 DIAGNOSIS — R569 Unspecified convulsions: Secondary | ICD-10-CM

## 2022-09-23 NOTE — Telephone Encounter (Signed)
Called and spoke with S[place]ara on 1/17 at 1:30pm as I did not receive PCS renewal forms.   She was able to fax new PCS forms which I have completed and placed in "to be faxed" bin in front office.   Alcus Dad, MD PGY-3, [place]

## 2022-09-24 DIAGNOSIS — G9340 Encephalopathy, unspecified: Secondary | ICD-10-CM | POA: Diagnosis not present

## 2022-09-25 DIAGNOSIS — G9340 Encephalopathy, unspecified: Secondary | ICD-10-CM | POA: Diagnosis not present

## 2022-09-26 ENCOUNTER — Other Ambulatory Visit: Payer: Self-pay | Admitting: Family Medicine

## 2022-09-26 DIAGNOSIS — G9340 Encephalopathy, unspecified: Secondary | ICD-10-CM | POA: Diagnosis not present

## 2022-09-26 DIAGNOSIS — F1090 Alcohol use, unspecified, uncomplicated: Secondary | ICD-10-CM

## 2022-09-27 DIAGNOSIS — G9340 Encephalopathy, unspecified: Secondary | ICD-10-CM | POA: Diagnosis not present

## 2022-09-28 DIAGNOSIS — G9340 Encephalopathy, unspecified: Secondary | ICD-10-CM | POA: Diagnosis not present

## 2022-09-29 DIAGNOSIS — G9340 Encephalopathy, unspecified: Secondary | ICD-10-CM | POA: Diagnosis not present

## 2022-09-30 DIAGNOSIS — G9340 Encephalopathy, unspecified: Secondary | ICD-10-CM | POA: Diagnosis not present

## 2022-10-01 DIAGNOSIS — G9340 Encephalopathy, unspecified: Secondary | ICD-10-CM | POA: Diagnosis not present

## 2022-10-02 DIAGNOSIS — G9340 Encephalopathy, unspecified: Secondary | ICD-10-CM | POA: Diagnosis not present

## 2022-10-03 DIAGNOSIS — G9340 Encephalopathy, unspecified: Secondary | ICD-10-CM | POA: Diagnosis not present

## 2022-10-04 DIAGNOSIS — G9340 Encephalopathy, unspecified: Secondary | ICD-10-CM | POA: Diagnosis not present

## 2022-10-05 DIAGNOSIS — G9340 Encephalopathy, unspecified: Secondary | ICD-10-CM | POA: Diagnosis not present

## 2022-10-06 ENCOUNTER — Ambulatory Visit: Payer: No Typology Code available for payment source | Admitting: Neurology

## 2022-10-06 ENCOUNTER — Encounter: Payer: Self-pay | Admitting: Neurology

## 2022-10-06 DIAGNOSIS — G9340 Encephalopathy, unspecified: Secondary | ICD-10-CM | POA: Diagnosis not present

## 2022-10-07 DIAGNOSIS — G9340 Encephalopathy, unspecified: Secondary | ICD-10-CM | POA: Diagnosis not present

## 2022-10-08 DIAGNOSIS — G9340 Encephalopathy, unspecified: Secondary | ICD-10-CM | POA: Diagnosis not present

## 2022-10-09 DIAGNOSIS — G9340 Encephalopathy, unspecified: Secondary | ICD-10-CM | POA: Diagnosis not present

## 2022-10-10 DIAGNOSIS — G9340 Encephalopathy, unspecified: Secondary | ICD-10-CM | POA: Diagnosis not present

## 2022-10-11 DIAGNOSIS — G9340 Encephalopathy, unspecified: Secondary | ICD-10-CM | POA: Diagnosis not present

## 2022-10-12 DIAGNOSIS — G9340 Encephalopathy, unspecified: Secondary | ICD-10-CM | POA: Diagnosis not present

## 2022-10-13 DIAGNOSIS — G9340 Encephalopathy, unspecified: Secondary | ICD-10-CM | POA: Diagnosis not present

## 2022-10-14 DIAGNOSIS — G9340 Encephalopathy, unspecified: Secondary | ICD-10-CM | POA: Diagnosis not present

## 2022-10-15 DIAGNOSIS — G9340 Encephalopathy, unspecified: Secondary | ICD-10-CM | POA: Diagnosis not present

## 2022-10-23 DIAGNOSIS — R4189 Other symptoms and signs involving cognitive functions and awareness: Secondary | ICD-10-CM | POA: Diagnosis not present

## 2022-10-24 DIAGNOSIS — R4189 Other symptoms and signs involving cognitive functions and awareness: Secondary | ICD-10-CM | POA: Diagnosis not present

## 2022-10-25 DIAGNOSIS — R4189 Other symptoms and signs involving cognitive functions and awareness: Secondary | ICD-10-CM | POA: Diagnosis not present

## 2022-10-26 ENCOUNTER — Other Ambulatory Visit: Payer: Self-pay | Admitting: Family Medicine

## 2022-10-26 DIAGNOSIS — F1090 Alcohol use, unspecified, uncomplicated: Secondary | ICD-10-CM

## 2022-10-26 DIAGNOSIS — R4189 Other symptoms and signs involving cognitive functions and awareness: Secondary | ICD-10-CM | POA: Diagnosis not present

## 2022-10-26 DIAGNOSIS — R262 Difficulty in walking, not elsewhere classified: Secondary | ICD-10-CM

## 2022-10-26 DIAGNOSIS — R569 Unspecified convulsions: Secondary | ICD-10-CM

## 2022-10-27 DIAGNOSIS — R4189 Other symptoms and signs involving cognitive functions and awareness: Secondary | ICD-10-CM | POA: Diagnosis not present

## 2022-10-28 ENCOUNTER — Other Ambulatory Visit: Payer: Self-pay | Admitting: Family Medicine

## 2022-10-28 DIAGNOSIS — R4189 Other symptoms and signs involving cognitive functions and awareness: Secondary | ICD-10-CM | POA: Diagnosis not present

## 2022-10-29 DIAGNOSIS — R4189 Other symptoms and signs involving cognitive functions and awareness: Secondary | ICD-10-CM | POA: Diagnosis not present

## 2022-10-30 DIAGNOSIS — R4189 Other symptoms and signs involving cognitive functions and awareness: Secondary | ICD-10-CM | POA: Diagnosis not present

## 2022-10-31 ENCOUNTER — Other Ambulatory Visit: Payer: Self-pay | Admitting: Family Medicine

## 2022-10-31 DIAGNOSIS — R4189 Other symptoms and signs involving cognitive functions and awareness: Secondary | ICD-10-CM | POA: Diagnosis not present

## 2022-10-31 NOTE — Telephone Encounter (Signed)
90 day supply of medications sent as requested. Future Zyprexa refills should come from his psychiatrist. If patient is not currently established with psychiatry, I would be happy to place a referral.

## 2022-11-01 DIAGNOSIS — R4189 Other symptoms and signs involving cognitive functions and awareness: Secondary | ICD-10-CM | POA: Diagnosis not present

## 2022-11-02 ENCOUNTER — Encounter: Payer: Self-pay | Admitting: *Deleted

## 2022-11-02 ENCOUNTER — Other Ambulatory Visit: Payer: Medicaid Other | Admitting: *Deleted

## 2022-11-02 ENCOUNTER — Ambulatory Visit: Payer: Medicaid Other | Admitting: Family Medicine

## 2022-11-02 DIAGNOSIS — R4189 Other symptoms and signs involving cognitive functions and awareness: Secondary | ICD-10-CM | POA: Diagnosis not present

## 2022-11-02 NOTE — Progress Notes (Deleted)
    SUBJECTIVE:   CHIEF COMPLAINT / HPI:  No chief complaint on file.   ***  PERTINENT  PMH / PSH: Seizure disorder, alcohol use disorder, cognitive impairment, HTN, GERD, mood disorder  Patient Care Team: Alcus Dad, MD as PCP - General (Family Medicine) Ethelda Chick as Social Worker   OBJECTIVE:   There were no vitals taken for this visit.  Physical Exam      09/09/2022    9:50 AM  Depression screen PHQ 2/9  Decreased Interest 1  Down, Depressed, Hopeless 0  PHQ - 2 Score 1  Altered sleeping 2  Tired, decreased energy 3  Change in appetite 2  Feeling bad or failure about yourself  3  Trouble concentrating 2  Moving slowly or fidgety/restless 3  Suicidal thoughts 0  PHQ-9 Score 16  Difficult doing work/chores Extremely dIfficult     Wt Readings from Last 3 Encounters:  09/09/22 130 lb 3.2 oz (59.1 kg)  07/14/22 131 lb 6.4 oz (59.6 kg)  04/27/22 141 lb 6.4 oz (64.1 kg)      {Labs  Heme  Chem  Endocrine  Serology  Results Review (optional):23779}  ASSESSMENT/PLAN:   No problem-specific Assessment & Plan notes found for this encounter.    No follow-ups on file.   Zola Button, MD [place]

## 2022-11-02 NOTE — Patient Instructions (Signed)
Visit Information  Mr. Toelle was given information about Medicaid Managed Care team care coordination services as a part of their [place] Medicaid benefit. Donnamarie Poag Delcid verbally consented to engagement with the [facility] Managed Care team.   If you are experiencing a medical emergency, please call 911 or report to your local emergency department or urgent care.   If you have a non-emergency medical problem during routine business hours, please contact your provider's office and ask to speak with a nurse.   For questions related to your [facility], please call: [phone] or visit the homepage here: [email or web address]  If you would like to schedule transportation through your [facility], please call the following number at least 2 days in advance of your appointment: [phone]   Rides for urgent appointments can also be made after hours by calling Member Services.  Call the [place] at [phone], at any time, 24 hours a day, 7 days a week. If you are in danger or need immediate medical attention call 911.  If you would like help to quit smoking, call 1-800-QUIT-NOW (517)357-7358) OR Espaol: 1-855-Djelo-Ya QO:409462) o para ms informacin haga clic aqu or Text READY to 200-400 to register via text  Mr. Caicedo,   Please see education materials related to asthma provided as print materials.   The patient verbalized understanding of instructions, educational materials, and care plan provided today and agreed to receive a mailed copy of patient instructions, educational materials, and care plan.   Telephone follow up appointment with Managed Medicaid care management team member scheduled for:12/05/22 @ [place] RN, BSN [place] RN Care Coordinator   Following is a copy of your plan  of care:  Care Plan : RN Care Manager Plan of Care  Updates made by Melissa Montane, RN since 11/02/2022 12:00 AM     Problem: Health Management needs related to Asthma      Long-Range Goal: Development of Plan of Care to address Health Management needs related to Asthma   Start Date: 11/02/2022  Expected End Date: 03/02/2023  Note:   Current Barriers:  Chronic Disease Management support and education needs related to Asthma  RNCM Clinical Goal(s):  Patient will verbalize understanding of plan for management of Asthma as evidenced by Patient/Mother reports take all medications exactly as prescribed and will call provider for medication related questions as evidenced by patient/mother reports and EMR documentation    attend all scheduled medical appointments: 11/09/22 with PCP and 01/24/23 with Neurology as evidenced by provider documentation        continue to work with [place] and/or Social Worker to address care management and care coordination needs related to Asthma as evidenced by adherence to CM Team Scheduled appointments     through collaboration with RN Care manager, provider, and care team.   Interventions: Inter-disciplinary care team collaboration (see longitudinal plan of care) Evaluation of current treatment plan related to  self management and patient's adherence to plan as established by provider Collaborate with PCP for requested DME-incontinence supplies Advised patient to attend upcoming Neurology appointment on 01/24/23, discuss PT and Aquatic Therapy   Asthma: (Status: New goal.) Long Term Goal  Provided patient with basic written and verbal Asthma education on self care/management/and exacerbation prevention; Advised patient to track and manage Asthma triggers;  Advised patient to self assesses Asthma action plan zone and make appointment with provider  if in the yellow zone for 48 hours without improvement; Provided education about and advised patient to utilize  infection prevention strategies to reduce risk of respiratory infection; Discussed the importance of adequate rest and management of fatigue with Asthma; Assessed social determinant of health barriers;  Collaborate with PCP for nebulizer solution and nebulizer machine-patient has not received previously ordered nebulizer Provided patient with [facility] transportation [phone], advised to call today to schedule transportation to PCP visit on 11/09/22  Patient Goals/Self-Care Activities: Take medications as prescribed   Attend all scheduled provider appointments Call provider office for new concerns or questions  Avoid asthma triggers Utilize infection prevention in the home

## 2022-11-02 NOTE — Patient Instructions (Incomplete)
It was nice seeing you today!  Blood work today.  See me in 3 months or whenever is a good for you.  Stay well, Kailena Lubas, MD [facility] [phone]  --  Make sure to check out at the front desk before you leave today.  Please arrive at least 15 minutes prior to your scheduled appointments.  If you had blood work today, I will send you a MyChart message or a letter if results are normal. Otherwise, I will give you a call.  If you had a referral placed, they will call you to set up an appointment. Please give us a call if you don't hear back in the next 2 weeks.  If you need additional refills before your next appointment, please call your pharmacy first.  

## 2022-11-02 NOTE — Patient Outreach (Signed)
Medicaid Managed Care   Nurse Care Manager Note  11/02/2022 Name:  Timothy Hubbard MRN:  [id number] DOB:  10-03-1990  Timothy Hubbard is an 32 y.o. year old male who is a primary patient of Timothy Dad, MD.  The [facility] Managed Care Coordination team was consulted for assistance with:    Asthma  Timothy Hubbard was given information about Medicaid Managed Care Coordination team services today. [place] Parent and Legal Guardian agreed to services and verbal consent obtained.  Engaged with patient by telephone for initial visit in response to provider referral for case management and/or care coordination services.   Assessments/Interventions:  Review of past medical history, allergies, medications, health status, including review of consultants reports, laboratory and other test data, was performed as part of comprehensive evaluation and provision of chronic care management services.  SDOH (Social Determinants of Health) assessments and interventions performed: SDOH Interventions    Flowsheet Row Patient Outreach Telephone from 11/02/2022 in [place] Interventions   Food Insecurity Interventions Intervention Not Indicated  Housing Interventions Intervention Not Indicated  Transportation Interventions Intervention Not Indicated  Utilities Interventions Intervention Not Indicated       Care Plan  Allergies  Allergen Reactions   Ibuprofen Anaphylaxis    Medications Reviewed Today     Reviewed by Timothy Montane, RN (Registered Nurse) on 11/02/22 at 1252  Med List Status: <None>   Medication Order Taking? Sig Documenting Provider Last Dose Status Informant  acetaminophen (TYLENOL) 325 MG tablet [id number]  Take 2 tablets (650 mg total) by mouth every 6 (six) hours as needed for mild pain or moderate pain. Timothy Button, MD  Active   albuterol (PROVENTIL) (2.5 MG/3ML) 0.083% nebulizer solution [id number]  USE (2.5 MG TOTAL) BY NEBULIZATION EVERY 6  (SIX) HOURS AS NEEDED FOR WHEEZING OR SHORTNESS OF BREATH. Timothy Dad, MD  Active   amLODipine (NORVASC) 10 MG tablet [id number]  TAKE 1 TABLET (10 MG TOTAL) BY MOUTH DAILY (AM) Timothy Dad, MD  Active   divalproex (DEPAKOTE) 500 MG DR tablet [id number]  TAKE ONE TABLET BY MOUTH TWICE DAILY (AM+BEDTIME) Timothy Dad, MD  Active   ferrous sulfate (FEROSUL) 325 (65 FE) MG tablet [id number]  TAKE ONE TABLET BY MOUTH ONCE DAILY AT 12 NOON (NOON) Timothy Dad, MD  Active   ferrous sulfate 324 (65 Fe) MG TBEC [id number]  Take 1 tablet (325 mg total) by mouth daily at 12 noon. Timothy Dad, MD  Active   fluticasone [facility]) 50 MCG/ACT nasal spray [id number]  Place 2 sprays into both nostrils daily. Timothy Essex, MD  Active   folic acid (FOLVITE) 1 MG tablet [id number]  TAKE 1 TABLET (1 MG TOTAL) BY MOUTH DAILY (AM) Timothy Dad, MD  Active   hydrocortisone (ANUSOL-HC) 2.5 % rectal cream [id number]  Place 1 Application rectally 2 (two) times daily. Timothy Button, MD  Active   lisinopril (ZESTRIL) 5 MG tablet [id number]  TAKE 1 TABLET (5 MG TOTAL) BY MOUTH DAILY (AM) Timothy Dad, MD  Active   magnesium oxide (MAG-OX) 400 (240 Mg) MG tablet [id number]  Take 1 tablet by mouth 2 (two) times daily. [provider]  Active   mometasone-formoterol (DULERA) 100-5 MCG/ACT Hollie Salk [id number]  INHALE TWO PUFFS INTO THE LUNGS TWICE A DAY Timothy Dad, MD  Active   Multiple Vitamin (MULTIVITAMIN WITH MINERALS) TABS tablet [id number]  Take 1 tablet by mouth daily. Hubbard, [place], MD  Active   naltrexone (DEPADE) 50 MG tablet  [id number]  TAKE 1/2 TABLET (25 MG TOTAL) BY MOUTH DAILY (AM) Timothy Dad, MD  Active   nicotine (NICODERM CQ - DOSED IN MG/24 HOURS) 14 mg/24hr patch [id number]  PLACE 1 PATCH (14 MG TOTAL) ONTO THE SKIN DAILY. Timothy Dad, MD  Active   OLANZapine (ZYPREXA) 5 MG tablet [id number]  Take 1 tablet (5 mg total) by mouth at bedtime. Future refills will need to come from  psychiatry. Timothy Dad, MD  Active   pantoprazole (PROTONIX) 40 MG tablet [id number]  TAKE 1 TABLET (40 MG TOTAL) BY MOUTH DAILY (AM) Timothy Dad, MD  Active   polyethylene glycol (MIRALAX / GLYCOLAX) 17 g packet [id number]  MIX AND DRINK BY MOUTH DAILY AS NEEDED FOR MODERATE CONSTIPATION Timothy Dad, MD  Active   VENTOLIN HFA 108 (90 Base) MCG/ACT inhaler [id number]  Inhale into the lungs. [provider]  Active             Patient Active Problem List   Diagnosis Date Noted   Tachycardia 09/10/2022   Post-nasal drip 07/15/2022   Cognitive impairment 07/15/2022   Impaired instrumental activities of daily living (IADL) 07/15/2022   Requires assistance with activities of daily living (ADL) 07/15/2022   Ambulatory dysfunction 04/06/2022   Mood disorder ([place]) 03/31/2022   Grade III internal hemorrhoids    Adenomatous polyp of descending colon    Iron deficiency anemia 03/04/2022   Seizure ([place])    Alcohol use disorder 10/21/2016   Gastroesophageal reflux disease 06/13/2016   Essential hypertension 04/01/2016   Asthma 04/01/2016   TOBACCO USER 06/13/2009    Conditions to be addressed/monitored per PCP order:  Asthma  Care Plan : RN Care Manager Plan of Care  Updates made by Timothy Montane, RN since 11/02/2022 12:00 AM     Problem: Health Management needs related to Asthma      Long-Range Goal: Development of Plan of Care to address Health Management needs related to Asthma   Start Date: 11/02/2022  Expected End Date: 03/02/2023  Note:   Current Barriers:  Chronic Disease Management support and education needs related to Asthma  RNCM Clinical Goal(s):  Patient will verbalize understanding of plan for management of Asthma as evidenced by Patient/Mother reports take all medications exactly as prescribed and will call provider for medication related questions as evidenced by patient/mother reports and EMR documentation    attend all scheduled medical  appointments: 11/09/22 with PCP and 01/24/23 with Neurology as evidenced by provider documentation        continue to work with [place] and/or Social Worker to address care management and care coordination needs related to Asthma as evidenced by adherence to CM Team Scheduled appointments     through collaboration with RN Care manager, provider, and care team.   Interventions: Inter-disciplinary care team collaboration (see longitudinal plan of care) Evaluation of current treatment plan related to  self management and patient's adherence to plan as established by provider Collaborate with PCP for requested DME-incontinence supplies Advised patient to attend upcoming Neurology appointment on 01/24/23, discuss PT and Aquatic Therapy   Asthma: (Status: New goal.) Long Term Goal  Provided patient with basic written and verbal Asthma education on self care/management/and exacerbation prevention; Advised patient to track and manage Asthma triggers;  Advised patient to self assesses Asthma action plan zone and make appointment with provider if in the yellow zone for 48 hours without improvement; Provided education about and advised patient to utilize infection prevention strategies to reduce risk  of respiratory infection; Discussed the importance of adequate rest and management of fatigue with Asthma; Assessed social determinant of health barriers;  Collaborate with PCP for nebulizer solution and nebulizer machine-patient has not received previously ordered nebulizer Provided patient with [facility] [place] transportation [phone], advised to call today to schedule transportation to PCP visit on 11/09/22  Patient Goals/Self-Care Activities: Take medications as prescribed   Attend all scheduled provider appointments Call provider office for new concerns or questions  Avoid asthma triggers Utilize infection prevention in the home       Follow Up:  Patient agrees to Care Plan and Follow-up.  Plan:  The Managed Medicaid care management team will reach out to the patient again over the next 30 days.  Date/time of next scheduled RN care management/care coordination outreach:  12/05/22 @ [place] RN, [place] RN Care Coordinator

## 2022-11-03 DIAGNOSIS — R4189 Other symptoms and signs involving cognitive functions and awareness: Secondary | ICD-10-CM | POA: Diagnosis not present

## 2022-11-04 DIAGNOSIS — R4189 Other symptoms and signs involving cognitive functions and awareness: Secondary | ICD-10-CM | POA: Diagnosis not present

## 2022-11-05 DIAGNOSIS — R4189 Other symptoms and signs involving cognitive functions and awareness: Secondary | ICD-10-CM | POA: Diagnosis not present

## 2022-11-06 DIAGNOSIS — R4189 Other symptoms and signs involving cognitive functions and awareness: Secondary | ICD-10-CM | POA: Diagnosis not present

## 2022-11-07 DIAGNOSIS — R4189 Other symptoms and signs involving cognitive functions and awareness: Secondary | ICD-10-CM | POA: Diagnosis not present

## 2022-11-08 DIAGNOSIS — R4189 Other symptoms and signs involving cognitive functions and awareness: Secondary | ICD-10-CM | POA: Diagnosis not present

## 2022-11-09 ENCOUNTER — Ambulatory Visit (INDEPENDENT_AMBULATORY_CARE_PROVIDER_SITE_OTHER): Payer: 59 | Admitting: Family Medicine

## 2022-11-09 ENCOUNTER — Encounter: Payer: Self-pay | Admitting: Family Medicine

## 2022-11-09 VITALS — BP 115/91 | HR 112 | Ht 64.0 in | Wt 121.0 lb

## 2022-11-09 DIAGNOSIS — B9689 Other specified bacterial agents as the cause of diseases classified elsewhere: Secondary | ICD-10-CM | POA: Diagnosis not present

## 2022-11-09 DIAGNOSIS — R5383 Other fatigue: Secondary | ICD-10-CM

## 2022-11-09 DIAGNOSIS — J329 Chronic sinusitis, unspecified: Secondary | ICD-10-CM

## 2022-11-09 DIAGNOSIS — R4189 Other symptoms and signs involving cognitive functions and awareness: Secondary | ICD-10-CM | POA: Diagnosis not present

## 2022-11-09 MED ORDER — AMOXICILLIN-POT CLAVULANATE 875-125 MG PO TABS: 1.0000 | ORAL_TABLET | Freq: Two times a day (BID) | ORAL | 0 refills | Status: AC

## 2022-11-09 NOTE — Patient Instructions (Addendum)
It was nice seeing you today!  Take the antibiotics for sinus infection to see if this helps.  Let me know if you change your mind about getting blood work.  Stay well, Zola Button, MD [place] [phone]  --  Make sure to check out at the front desk before you leave today.  Please arrive at least 15 minutes prior to your scheduled appointments.  If you had blood work today, I will send you a MyChart message or a letter if results are normal. Otherwise, I will give you a call.  If you had a referral placed, they will call you to set up an appointment. Please give [place] a call if you don't hear back in the next 2 weeks.  If you need additional refills before your next appointment, please call your pharmacy first.

## 2022-11-09 NOTE — Progress Notes (Signed)
    SUBJECTIVE:   CHIEF COMPLAINT / HPI:  Chief Complaint  Patient presents with   Anorexia    X 1 month    Decreased appetite, nausea, and vomiting x 3 weeks. Also with cough, congestion, rhinorrhea which has been persistent. Eating only once a day, not a whole meal. Still not much appetite and with decreased energy. Nausea/vomiting is now resolved. Denies diarrhea, abdominal pain, chest pain, SOB, blood in stool, melena. Still taking iron supplementation. Has not yet established with psychiatrist. Not sleeping well. Mother wanting something to help with his appetite.  PERTINENT  PMH / PSH: Alcohol use disorder, asthma, HTN, GERD, mood disorder, seizure disorder, tobacco use  Patient Care Team: Alcus Dad, MD as PCP - General (Family Medicine) Ethelda Chick as Social Worker Melissa Montane, RN as Case Manager   OBJECTIVE:   BP (!) 115/91   Pulse (!) 112   Ht '5\' 4"'$  (1.626 m)   Wt 121 lb (54.9 kg)   SpO2 100%   BMI 20.77 kg/m   Physical Exam Constitutional:      General: He is not in acute distress. HENT:     Head: Normocephalic and atraumatic.     Comments: No frontal or maxillary sinus tenderness    Nose: Congestion and rhinorrhea present.  Cardiovascular:     Rate and Rhythm: Normal rate and regular rhythm.     Heart sounds: Normal heart sounds.  Pulmonary:     Effort: Pulmonary effort is normal. No respiratory distress.     Breath sounds: Normal breath sounds.  Abdominal:     Palpations: Abdomen is soft.     Tenderness: There is no abdominal tenderness.  Musculoskeletal:     Cervical back: Neck supple.  Skin:    General: Skin is warm and dry.  Neurological:     Mental Status: He is alert.         11/09/2022    8:56 AM  Depression screen PHQ 2/9  Decreased Interest 0  Down, Depressed, Hopeless 0  PHQ - 2 Score 0  Altered sleeping 2  Tired, decreased energy 3  Change in appetite 1  Feeling bad or failure about yourself  0  Trouble  concentrating 1  Moving slowly or fidgety/restless 0  Suicidal thoughts 0  PHQ-9 Score 7  Difficult doing work/chores Not difficult at all     Wt Readings from Last 3 Encounters:  11/09/22 121 lb (54.9 kg)  09/09/22 130 lb 3.2 oz (59.1 kg)  07/14/22 131 lb 6.4 oz (59.6 kg)        ASSESSMENT/PLAN:   Fatigue Ongoing fatigue suspect likely multifactorial in the setting of underlying anemia, upper respiratory illness, medication related (on multiple psychotropic medications).  Given prolonged symptoms we will treat for bacterial sinusitis though I do not think this is the primary cause of his symptoms. - recommended lab work but patient declined, they will let me know if he changes his mind and can place lab orders (would check CBC, CMP, TSH reflex). - could consider mirtazapine to help with appetite, would defer to psychiatry - amoxicillin-clavulanate BID x 5d - mother working on getting him reestablished with psychiatry, may need medication adjustment  Return in about 4 weeks (around 12/07/2022) for f/u weight loss.   Zola Button, MD [place]

## 2022-11-10 ENCOUNTER — Other Ambulatory Visit: Payer: Self-pay | Admitting: Family Medicine

## 2022-11-10 DIAGNOSIS — J4541 Moderate persistent asthma with (acute) exacerbation: Secondary | ICD-10-CM

## 2022-11-10 DIAGNOSIS — R4189 Other symptoms and signs involving cognitive functions and awareness: Secondary | ICD-10-CM | POA: Diagnosis not present

## 2022-11-10 NOTE — Progress Notes (Signed)
Received the following message from patient's case manager:  Hi Dr. Rock Nephew,  I am the [place] Medicaid Case Manager working with this patient. Today his mother request nebulizer solution and machine. She was unable to get this from [employer], as she was going to have to pay out of pocket. Can you reorder the machine and send to [employer] fax # [phone]? Also, she reports needing incontinence supplies for Mr. Heuberger. Can you include this with the order? Thank you!    DME nebulizer order placed and routed to RN staff to send to [employer].  Patient should reach out to [facility] regarding incontinence supplies and they will contact our office if additional information is needed.   Alcus Dad, MD PGY-3, [place]

## 2022-11-11 DIAGNOSIS — R4189 Other symptoms and signs involving cognitive functions and awareness: Secondary | ICD-10-CM | POA: Diagnosis not present

## 2022-11-12 DIAGNOSIS — R4189 Other symptoms and signs involving cognitive functions and awareness: Secondary | ICD-10-CM | POA: Diagnosis not present

## 2022-11-13 DIAGNOSIS — R4189 Other symptoms and signs involving cognitive functions and awareness: Secondary | ICD-10-CM | POA: Diagnosis not present

## 2022-11-14 DIAGNOSIS — R4189 Other symptoms and signs involving cognitive functions and awareness: Secondary | ICD-10-CM | POA: Diagnosis not present

## 2022-11-14 NOTE — Progress Notes (Signed)
Community message sent to [employer]. 

## 2022-11-15 DIAGNOSIS — R4189 Other symptoms and signs involving cognitive functions and awareness: Secondary | ICD-10-CM | POA: Diagnosis not present

## 2022-11-16 DIAGNOSIS — R4189 Other symptoms and signs involving cognitive functions and awareness: Secondary | ICD-10-CM | POA: Diagnosis not present

## 2022-11-17 ENCOUNTER — Telehealth: Payer: Self-pay | Admitting: Neurology

## 2022-11-17 ENCOUNTER — Telehealth: Payer: Self-pay

## 2022-11-17 DIAGNOSIS — R4189 Other symptoms and signs involving cognitive functions and awareness: Secondary | ICD-10-CM | POA: Diagnosis not present
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
## 2022-11-17 NOTE — Telephone Encounter (Signed)
Unfortunately I am unable to provide such a letter. There is no medical indication for patient needing to be home. If there is an acute issue he could request to be transported to the ED.   Alcus Dad, MD PGY-3, [place]
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
## 2022-11-18 DIAGNOSIS — R4189 Other symptoms and signs involving cognitive functions and awareness: Secondary | ICD-10-CM | POA: Diagnosis not present

## 2022-11-19 DIAGNOSIS — R4189 Other symptoms and signs involving cognitive functions and awareness: Secondary | ICD-10-CM | POA: Diagnosis not present

## 2022-11-20 DIAGNOSIS — R4189 Other symptoms and signs involving cognitive functions and awareness: Secondary | ICD-10-CM | POA: Diagnosis not present

## 2022-11-21 DIAGNOSIS — R4189 Other symptoms and signs involving cognitive functions and awareness: Secondary | ICD-10-CM | POA: Diagnosis not present

## 2022-11-22 DIAGNOSIS — R4189 Other symptoms and signs involving cognitive functions and awareness: Secondary | ICD-10-CM | POA: Diagnosis not present

## 2022-11-23 DIAGNOSIS — R4189 Other symptoms and signs involving cognitive functions and awareness: Secondary | ICD-10-CM | POA: Diagnosis not present

## 2022-11-24 DIAGNOSIS — R4189 Other symptoms and signs involving cognitive functions and awareness: Secondary | ICD-10-CM | POA: Diagnosis not present

## 2022-11-25 DIAGNOSIS — R4189 Other symptoms and signs involving cognitive functions and awareness: Secondary | ICD-10-CM | POA: Diagnosis not present

## 2022-11-26 DIAGNOSIS — R4189 Other symptoms and signs involving cognitive functions and awareness: Secondary | ICD-10-CM | POA: Diagnosis not present

## 2022-11-27 DIAGNOSIS — R4189 Other symptoms and signs involving cognitive functions and awareness: Secondary | ICD-10-CM | POA: Diagnosis not present

## 2022-11-28 ENCOUNTER — Telehealth: Payer: Self-pay

## 2022-11-28 DIAGNOSIS — R4189 Other symptoms and signs involving cognitive functions and awareness: Secondary | ICD-10-CM | POA: Diagnosis not present

## 2022-11-28 NOTE — Patient Outreach (Signed)
BSW received a telephone call from patients mom stating that he needs some help with alcohol. Mom states he is willing to go into a program either inpatient or outpatient. BSW offered to connect patient with LCSW but mom stated she wanted a program. BSW provided mom via email a list of resources. BSW will follow up in 30 days.   [name], BSW, [place] Managed Medicaid Team  [phone]

## 2022-11-28 NOTE — Patient Instructions (Signed)
Visit Information  Mr. Kirstein was given information about Medicaid Managed Care team care coordination services as a part of their [place] Medicaid benefit. Donnamarie Poag Riemenschneider verbally consented to engagement with the [facility] Managed Care team.   If you are experiencing a medical emergency, please call 911 or report to your local emergency department or urgent care.   If you have a non-emergency medical problem during routine business hours, please contact your provider's office and ask to speak with a nurse.   For questions related to your [facility], please call: [phone] or visit the homepage here: [email or web address]  If you would like to schedule transportation through your [facility], please call the following number at least 2 days in advance of your appointment: [phone]   Rides for urgent appointments can also be made after hours by calling Member Services.  Call the [place] at [phone], at any time, 24 hours a day, 7 days a week. If you are in danger or need immediate medical attention call 911.  If you would like help to quit smoking, call 1-800-QUIT-NOW 669-761-4610) OR Espaol: 1-855-Djelo-Ya QO:409462) o para ms informacin haga clic aqu or Text READY to 200-400 to register via text  Mr. Tayag - following are the goals we discussed in your visit today:   Goals Addressed   None       Social Worker will follow up in 30 days.   [name], BSW, [place] Managed Medicaid Team  [phone]   Following is a copy of your plan of care:  There are no care plans that you recently modified to display for this patient.

## 2022-11-29 DIAGNOSIS — R4189 Other symptoms and signs involving cognitive functions and awareness: Secondary | ICD-10-CM | POA: Diagnosis not present

## 2022-11-30 DIAGNOSIS — R4189 Other symptoms and signs involving cognitive functions and awareness: Secondary | ICD-10-CM | POA: Diagnosis not present

## 2022-12-01 DIAGNOSIS — R4189 Other symptoms and signs involving cognitive functions and awareness: Secondary | ICD-10-CM | POA: Diagnosis not present

## 2022-12-02 DIAGNOSIS — R4189 Other symptoms and signs involving cognitive functions and awareness: Secondary | ICD-10-CM | POA: Diagnosis not present

## 2022-12-03 DIAGNOSIS — R4189 Other symptoms and signs involving cognitive functions and awareness: Secondary | ICD-10-CM | POA: Diagnosis not present

## 2022-12-04 DIAGNOSIS — R4189 Other symptoms and signs involving cognitive functions and awareness: Secondary | ICD-10-CM | POA: Diagnosis not present

## 2022-12-05 ENCOUNTER — Other Ambulatory Visit: Payer: 59 | Admitting: *Deleted

## 2022-12-05 ENCOUNTER — Other Ambulatory Visit: Payer: Self-pay | Admitting: Family Medicine

## 2022-12-05 ENCOUNTER — Encounter: Payer: Self-pay | Admitting: *Deleted

## 2022-12-05 DIAGNOSIS — R4189 Other symptoms and signs involving cognitive functions and awareness: Secondary | ICD-10-CM | POA: Diagnosis not present

## 2022-12-05 MED ORDER — HYDROCORTISONE (PERIANAL) 2.5 % EX CREA: 1.0000 | TOPICAL_CREAM | Freq: Two times a day (BID) | CUTANEOUS | 0 refills | Status: DC

## 2022-12-05 NOTE — Patient Instructions (Signed)
Visit Information  [name] was given information about Medicaid Managed Care team care coordination services as a part of their [place] Medicaid benefit. Timothy Hubbard verbally consented to engagement with the [facility] Managed Care team.   If you are experiencing a medical emergency, please call 911 or report to your local emergency department or urgent care.   If you have a non-emergency medical problem during routine business hours, please contact your provider's office and ask to speak with a nurse.   For questions related to your [facility], please call: [phone] or visit the homepage here: [email or web address]  If you would like to schedule transportation through your [facility], please call the following number at least 2 days in advance of your appointment: [phone]   Rides for urgent appointments can also be made after hours by calling Member Services.  Call the [place] at [phone], at any time, 24 hours a day, 7 days a week. If you are in danger or need immediate medical attention call 911.  If you would like help to quit smoking, call 1-800-QUIT-NOW 872 885 2426) OR Espaol: 1-855-Djelo-Ya QO:409462) o para ms informacin haga clic aqu or Text READY to 200-400 to register via text  Timothy Hubbard,   Please see education materials related to asthma and sobriety provided as print materials.   The patient verbalized understanding of instructions, educational materials, and care plan provided today and agreed to receive a mailed copy of patient instructions, educational materials, and care plan.   Telephone follow up appointment with Managed Medicaid care management team member scheduled for:01/06/23 @ [place] RN, [facility] RN Care Coordinator [phone]   Following is a  copy of your plan of care:  Care Plan : RN Care Manager Plan of Care  Updates made by Timothy Montane, RN since 12/05/2022 12:00 AM     Problem: Health Management needs related to Asthma      Long-Range Goal: Development of Plan of Care to address Health Management needs related to Asthma   Start Date: 11/02/2022  Expected End Date: 03/02/2023  Note:   Current Barriers:  Chronic Disease Management support and education needs related to Asthma  RNCM Clinical Goal(s):  Patient will verbalize understanding of plan for management of Asthma as evidenced by Patient/Mother reports take all medications exactly as prescribed and will call provider for medication related questions as evidenced by patient/mother reports and EMR documentation    attend all scheduled medical appointments:  01/24/23 with Neurology as evidenced by provider documentation        continue to work with [place] and/or Social Worker to address care management and care coordination needs related to Asthma as evidenced by adherence to CM Team Scheduled appointments     through collaboration with RN Care manager, provider, and care team.   Interventions: Inter-disciplinary care team collaboration (see longitudinal plan of care) Evaluation of current treatment plan related to  self management and patient's adherence to plan as established by provider Advised patient to attend upcoming Neurology appointment on 01/24/23, discuss PT and Aquatic Therapy Advised to schedule follow up with PCP Collaborated with PCP for refill of Anusol cream and requested 90 day refills on any appropriate medications Collaborated with BSW to resend list of Alcohol Rehab facilities to patient Provided therapeutic listening Provided patient with [facility] member services line [phone], call to request BP cuff and list of  Alcohol Rehab Facilities Referral to LCSW for assistance with Sobriety, scheduled on 12/16/22 @ 9am   Asthma: (Status: New goal.)  Long Term Goal  Provided patient with basic written and verbal Asthma education on self care/management/and exacerbation prevention; Advised patient to track and manage Asthma triggers;  Advised patient to self assesses Asthma action plan zone and make appointment with provider if in the yellow zone for 48 hours without improvement; Provided education about and advised patient to utilize infection prevention strategies to reduce risk of respiratory infection; Discussed the importance of adequate rest and management of fatigue with Asthma; Assessed social determinant of health barriers;  Verified patient received nebulizer  Patient Goals/Self-Care Activities: Take medications as prescribed   Attend all scheduled provider appointments Call provider office for new concerns or questions  Avoid asthma triggers Utilize infection prevention in the home

## 2022-12-05 NOTE — Patient Outreach (Signed)
BSW received a message from [facility] stating mom was on the phone asking for alcohol abuse resources. BSW emailed to mom on last week. BSW will resend email and mail to patient and mom.   [name], BSW, [place] Managed Medicaid Team  [phone]

## 2022-12-05 NOTE — Patient Outreach (Signed)
Medicaid Managed Care   Nurse Care Manager Note  12/05/2022 Name:  Timothy Hubbard MRN:  [id number] DOB:  1991/03/17  Timothy Hubbard is an 32 y.o. year old male who is a primary patient of Alcus Dad, MD.  The [facility] Managed Care Coordination team was consulted for assistance with:    Asthma  Mr. Radoncic was given information about Medicaid Managed Care Coordination team services today. Timothy Hubbard Patient agreed to services and verbal consent obtained.  Engaged with patient by telephone for follow up visit in response to provider referral for case management and/or care coordination services.   Assessments/Interventions:  Review of past medical history, allergies, medications, health status, including review of consultants reports, laboratory and other test data, was performed as part of comprehensive evaluation and provision of chronic care management services.  SDOH (Social Determinants of Health) assessments and interventions performed: SDOH Interventions    Flowsheet Row Patient Outreach Telephone from 12/05/2022 in [place] Patient Outreach Telephone from 11/02/2022 in [place] Interventions -- Intervention Not Indicated  Housing Interventions -- Intervention Not Indicated  Transportation Interventions -- Intervention Not Indicated  Utilities Interventions -- Intervention Not Indicated  Alcohol Usage Interventions Other (Comment)  [LCSW referral] --       Care Plan  Allergies  Allergen Reactions   Ibuprofen Anaphylaxis    Medications Reviewed Today     Reviewed by Melissa Montane, RN (Registered Nurse) on 12/05/22 at [phone]  Med List Status: <None>   Medication Order Taking? Sig Documenting Provider Last Dose Status Informant  acetaminophen (TYLENOL) 325 MG tablet [id number] Yes Take 2 tablets (650 mg total) by mouth every 6 (six) hours as needed for mild pain or moderate  pain. Zola Button, MD Taking Active   albuterol (PROVENTIL) (2.5 MG/3ML) 0.083% nebulizer solution [id number] Yes USE (2.5 MG TOTAL) BY NEBULIZATION EVERY 6 (SIX) HOURS AS NEEDED FOR WHEEZING OR SHORTNESS OF BREATH. Alcus Dad, MD Taking Active            Med Note (Jerrick Farve A   Mon Dec 05, 2022  9:16 AM)    amLODipine (NORVASC) 10 MG tablet [id number] Yes TAKE 1 TABLET (10 MG TOTAL) BY MOUTH DAILY (AM) Alcus Dad, MD Taking Active   divalproex (DEPAKOTE) 500 MG DR tablet [id number] Yes TAKE ONE TABLET BY MOUTH TWICE DAILY (AM+BEDTIME) Alcus Dad, MD Taking Active   ferrous sulfate (FEROSUL) 325 (65 FE) MG tablet [id number] Yes TAKE ONE TABLET BY MOUTH ONCE DAILY AT 12 NOON (NOON) Alcus Dad, MD Taking Active   ferrous sulfate 324 (65 Fe) MG TBEC [id number] Yes Take 1 tablet (325 mg total) by mouth daily at 12 noon. Alcus Dad, MD Taking Active   fluticasone [facility]) 50 MCG/ACT nasal spray [id number] Yes Place 2 sprays into both nostrils daily. Ezequiel Essex, MD Taking Active   folic acid (FOLVITE) 1 MG tablet [id number] Yes TAKE 1 TABLET (1 MG TOTAL) BY MOUTH DAILY (AM) Alcus Dad, MD Taking Active   hydrocortisone (ANUSOL-HC) 2.5 % rectal cream [id number] No Place 1 Application rectally 2 (two) times daily.  Patient not taking: Reported on 12/05/2022   Zola Button, MD Not Taking Active   lisinopril (ZESTRIL) 5 MG tablet [id number] Yes TAKE 1 TABLET (5 MG TOTAL) BY MOUTH DAILY (AM) Alcus Dad, MD Taking Active   magnesium oxide (MAG-OX) 400 (240 Mg) MG tablet [id number] No Take 1 tablet by mouth 2 (  two) times daily.  Patient not taking: Reported on 11/02/2022   [provider] Not Taking Active   mometasone-formoterol [facility]) 100-5 MCG/ACT Hollie Salk [id number] Yes INHALE TWO PUFFS INTO THE LUNGS TWICE A Liliane Bade, MD Taking Active   Multiple Vitamin (MULTIVITAMIN WITH MINERALS) TABS tablet [id number] Yes Take 1 tablet by mouth daily. Pokhrel, Laxman, MD  Taking Active   naltrexone (DEPADE) 50 MG tablet [id number] Yes TAKE 1/2 TABLET (25 MG TOTAL) BY MOUTH DAILY (AM) Alcus Dad, MD Taking Active   nicotine (NICODERM CQ - DOSED IN MG/24 HOURS) 14 mg/24hr patch [id number] No PLACE 1 PATCH (14 MG TOTAL) ONTO THE SKIN DAILY.  Patient not taking: Reported on 12/05/2022   Alcus Dad, MD Not Taking Active   OLANZapine (ZYPREXA) 5 MG tablet [id number] Yes Take 1 tablet (5 mg total) by mouth at bedtime. Future refills will need to come from psychiatry. Alcus Dad, MD Taking Active   pantoprazole (PROTONIX) 40 MG tablet [id number] Yes TAKE 1 TABLET (40 MG TOTAL) BY MOUTH DAILY (AM) Alcus Dad, MD Taking Active   polyethylene glycol (MIRALAX / GLYCOLAX) 17 g packet [id number] Yes MIX AND DRINK BY MOUTH DAILY AS NEEDED FOR MODERATE CONSTIPATION Alcus Dad, MD Taking Active   VENTOLIN HFA 108 (90 Base) MCG/ACT inhaler [id number] Yes Inhale into the lungs. [provider] Taking Active             Patient Active Problem List   Diagnosis Date Noted   Tachycardia 09/10/2022   Post-nasal drip 07/15/2022   Cognitive impairment 07/15/2022   Impaired instrumental activities of daily living (IADL) 07/15/2022   Requires assistance with activities of daily living (ADL) 07/15/2022   Ambulatory dysfunction 04/06/2022   Mood disorder 03/31/2022   Grade III internal hemorrhoids    Adenomatous polyp of descending colon    Iron deficiency anemia 03/04/2022   Seizure    Alcohol use disorder 10/21/2016   Gastroesophageal reflux disease 06/13/2016   Essential hypertension 04/01/2016   Asthma 04/01/2016   TOBACCO USER 06/13/2009    Conditions to be addressed/monitored per PCP order:   Asthma  Care Plan : RN Care Manager Plan of Care  Updates made by Melissa Montane, RN since 12/05/2022 12:00 AM     Problem: Health Management needs related to Asthma      Long-Range Goal: Development of Plan of Care to address Health Management needs  related to Asthma   Start Date: 11/02/2022  Expected End Date: 03/02/2023  Note:   Current Barriers:  Chronic Disease Management support and education needs related to Asthma  RNCM Clinical Goal(s):  Patient will verbalize understanding of plan for management of Asthma as evidenced by Patient/Mother reports take all medications exactly as prescribed and will call provider for medication related questions as evidenced by patient/mother reports and EMR documentation    attend all scheduled medical appointments:  01/24/23 with Neurology as evidenced by provider documentation        continue to work with RN Care Manager and/or Social Worker to address care management and care coordination needs related to Asthma as evidenced by adherence to CM Team Scheduled appointments     through collaboration with RN Care manager, provider, and care team.   Interventions: Inter-disciplinary care team collaboration (see longitudinal plan of care) Evaluation of current treatment plan related to  self management and patient's adherence to plan as established by provider Advised patient to attend upcoming Neurology appointment on 01/24/23, discuss PT and Aquatic Therapy Advised to  schedule follow up with PCP Collaborated with PCP for refill of Anusol cream and requested 90 day refills on any appropriate medications Collaborated with BSW to resend list of Alcohol Rehab facilities to patient Provided therapeutic listening Provided patient with [facility] member services line [phone], call to request BP cuff and list of Alcohol Rehab Facilities Referral to LCSW for assistance with Sobriety, scheduled on 12/16/22 @ 9am   Asthma: (Status: New goal.) Long Term Goal  Provided patient with basic written and verbal Asthma education on self care/management/and exacerbation prevention; Advised patient to track and manage Asthma triggers;  Advised patient to self assesses Asthma action plan zone and make appointment with  provider if in the yellow zone for 48 hours without improvement; Provided education about and advised patient to utilize infection prevention strategies to reduce risk of respiratory infection; Discussed the importance of adequate rest and management of fatigue with Asthma; Assessed social determinant of health barriers;  Verified patient received nebulizer  Patient Goals/Self-Care Activities: Take medications as prescribed   Attend all scheduled provider appointments Call provider office for new concerns or questions  Avoid asthma triggers Utilize infection prevention in the home       Follow Up:  Patient agrees to Care Plan and Follow-up.  Plan: The Managed Medicaid care management team will reach out to the patient again over the next 30 days.  Date/time of next scheduled RN care management/care coordination outreach:  01/06/23 @ [place] RN, [place] Medicaid RN Care Coordinator [phone]

## 2022-12-05 NOTE — Progress Notes (Signed)
Received staff message from [place] that patient is requesting refill of hydrocortisone rectal cream. Refill sent.   Alcus Dad, MD PGY-3, [place]

## 2022-12-06 DIAGNOSIS — R4189 Other symptoms and signs involving cognitive functions and awareness: Secondary | ICD-10-CM | POA: Diagnosis not present

## 2022-12-07 DIAGNOSIS — R4189 Other symptoms and signs involving cognitive functions and awareness: Secondary | ICD-10-CM | POA: Diagnosis not present

## 2022-12-08 DIAGNOSIS — R4189 Other symptoms and signs involving cognitive functions and awareness: Secondary | ICD-10-CM | POA: Diagnosis not present

## 2022-12-09 DIAGNOSIS — R4189 Other symptoms and signs involving cognitive functions and awareness: Secondary | ICD-10-CM | POA: Diagnosis not present

## 2022-12-10 DIAGNOSIS — R4189 Other symptoms and signs involving cognitive functions and awareness: Secondary | ICD-10-CM | POA: Diagnosis not present

## 2022-12-11 DIAGNOSIS — R4189 Other symptoms and signs involving cognitive functions and awareness: Secondary | ICD-10-CM | POA: Diagnosis not present

## 2022-12-12 ENCOUNTER — Ambulatory Visit: Payer: 59 | Admitting: Family Medicine

## 2022-12-12 DIAGNOSIS — R4189 Other symptoms and signs involving cognitive functions and awareness: Secondary | ICD-10-CM | POA: Diagnosis not present

## 2022-12-12 NOTE — Progress Notes (Deleted)
    SUBJECTIVE:   CHIEF COMPLAINT / HPI:   ***  PERTINENT  PMH / PSH: Alcohol use disorder, HTN, mood disorder, seizure, iron deficiency anemia, tobacco use  OBJECTIVE:   There were no vitals taken for this visit.  ***  ASSESSMENT/PLAN:   No problem-specific Assessment & Plan notes found for this encounter.     Maury Dus, MD [facility]

## 2022-12-13 DIAGNOSIS — R4189 Other symptoms and signs involving cognitive functions and awareness: Secondary | ICD-10-CM | POA: Diagnosis not present

## 2022-12-14 ENCOUNTER — Ambulatory Visit: Payer: 59 | Admitting: Family Medicine

## 2022-12-14 DIAGNOSIS — R4189 Other symptoms and signs involving cognitive functions and awareness: Secondary | ICD-10-CM | POA: Diagnosis not present

## 2022-12-15 ENCOUNTER — Other Ambulatory Visit: Payer: 59

## 2022-12-15 DIAGNOSIS — R4189 Other symptoms and signs involving cognitive functions and awareness: Secondary | ICD-10-CM | POA: Diagnosis not present

## 2022-12-15 NOTE — Patient Instructions (Signed)
Visit Information  [name] was given information about Medicaid Managed Care team care coordination services as a part of their [facility] Medicaid benefit. Timothy Hubbard verbally consented to engagement with the [facility] Managed Care team.   If you are experiencing a medical emergency, please call 911 or report to your local emergency department or urgent care.   If you have a non-emergency medical problem during routine business hours, please contact your provider's office and ask to speak with a nurse.   For questions related to your [facility], please call: [phone] or visit the homepage here: [email or web address]  If you would like to schedule transportation through your [facility], please call the following number at least 2 days in advance of your appointment: [phone]   Rides for urgent appointments can also be made after hours by calling Member Services.  Call the Behavioral Health Crisis Line at [phone], at any time, 24 hours a day, 7 days a week. If you are in danger or need immediate medical attention call 911.  If you would like help to quit smoking, call 1-800-QUIT-NOW (704-559-3444) OR Espaol: 1-855-Djelo-Ya (7-741-287-8676) o para ms informacin haga clic aqu or Text READY to 720-947 to register via text  Timothy Hubbard - following are the goals we discussed in your visit today:   Goals Addressed   None       Social Worker will follow up in 30 days.   Timothy Hubbard, Timothy Hubbard, MHA [facility]  Managed Medicaid Social Worker [phone]   Following is a copy of your plan of care:  There are no care plans that you recently modified to display for this patient.

## 2022-12-15 NOTE — Patient Outreach (Signed)
Medicaid Managed Care Social Work Note  12/15/2022 Name:  Timothy Hubbard MRN:  [id number] DOB:  05/26/1991  Timothy Hubbard is an 32 y.o. year old male who is a primary patient of Maury Dus, MD.  The [facility] Managed Care Coordination team was consulted for assistance with:   Alcohol resources  Mr. Gulotta was given information about Medicaid Managed Care Coordination team services today. Timothy Philips Friesen Patient agreed to services and verbal consent obtained.  Engaged with patient  for by telephone forfollow up visit in response to referral for case management and/or care coordination services.   Assessments/Interventions:  Review of past medical history, allergies, medications, health status, including review of consultants reports, laboratory and other test data, was performed as part of comprehensive evaluation and provision of chronic care management services.  SDOH: (Social Determinant of Health) assessments and interventions performed: SDOH Interventions    Flowsheet Row Patient Outreach Telephone from 12/05/2022 in [place] POPULATION HEALTH DEPARTMENT Patient Outreach Telephone from 11/02/2022 in [place] POPULATION HEALTH DEPARTMENT  SDOH Interventions    Food Insecurity Interventions -- Intervention Not Indicated  Housing Interventions -- Intervention Not Indicated  Transportation Interventions -- Intervention Not Indicated  Utilities Interventions -- Intervention Not Indicated  Alcohol Usage Interventions Other (Comment)  [LCSW referral] --     BSW completed a telephone outreach with patients mom. She stated she did not receive the resources BSW sent and would like for them to be resent. BSW verified email and resent. Mom also states that she is having issues with patients pharmacy. BSW will send a message to pharm to see about patient switching pharmacies.  Advanced Directives Status:  Not addressed in this encounter.  Care Plan                 Allergies  Allergen  Reactions   Ibuprofen Anaphylaxis    Medications Reviewed Today     Reviewed by [name], RN (Registered Nurse) on 12/05/22 at [phone]  Med List Status: <None>   Medication Order Taking? Sig Documenting Provider Last Dose Status Informant  acetaminophen (TYLENOL) 325 MG tablet [id number] Yes Take 2 tablets (650 mg total) by mouth every 6 (six) hours as needed for mild pain or moderate pain. Littie Deeds, MD Taking Active   albuterol (PROVENTIL) (2.5 MG/3ML) 0.083% nebulizer solution [id number] Yes USE (2.5 MG TOTAL) BY NEBULIZATION EVERY 6 (SIX) HOURS AS NEEDED FOR WHEEZING OR SHORTNESS OF BREATH. Maury Dus, MD Taking Active            Med Note (ROBB, MELANIE A   Mon Dec 05, 2022  9:16 AM)    amLODipine (NORVASC) 10 MG tablet [id number] Yes TAKE 1 TABLET (10 MG TOTAL) BY MOUTH DAILY (AM) Maury Dus, MD Taking Active   divalproex (DEPAKOTE) 500 MG DR tablet [id number] Yes TAKE ONE TABLET BY MOUTH TWICE DAILY (AM+BEDTIME) Maury Dus, MD Taking Active   ferrous sulfate (FEROSUL) 325 (65 FE) MG tablet [id number] Yes TAKE ONE TABLET BY MOUTH ONCE DAILY AT 12 NOON (NOON) Maury Dus, MD Taking Active   ferrous sulfate 324 (65 Fe) MG TBEC [id number] Yes Take 1 tablet (325 mg total) by mouth daily at 12 noon. Maury Dus, MD Taking Active   fluticasone [facility]) 50 MCG/ACT nasal spray [id number] Yes Place 2 sprays into both nostrils daily. Fayette Pho, MD Taking Active   folic acid (FOLVITE) 1 MG tablet [id number] Yes TAKE 1 TABLET (1 MG TOTAL) BY MOUTH DAILY (AM) Maury Dus, MD Taking  Active   hydrocortisone (ANUSOL-HC) 2.5 % rectal cream [id number] No Place 1 Application rectally 2 (two) times daily.  Patient not taking: Reported on 12/05/2022   Littie Deeds, MD Not Taking Active   lisinopril (ZESTRIL) 5 MG tablet [id number] Yes TAKE 1 TABLET (5 MG TOTAL) BY MOUTH DAILY (AM) Maury Dus, MD Taking Active   magnesium oxide (MAG-OX) 400 (240 Mg) MG tablet [id number] No Take  1 tablet by mouth 2 (two) times daily.  Patient not taking: Reported on 11/02/2022   [provider] Not Taking Active   mometasone-formoterol [facility]) 100-5 MCG/ACT Sandrea Matte [id number] Yes INHALE TWO PUFFS INTO THE LUNGS TWICE A Annia Friendly, MD Taking Active   Multiple Vitamin (MULTIVITAMIN WITH MINERALS) TABS tablet [id number] Yes Take 1 tablet by mouth daily. Pokhrel, Laxman, MD Taking Active   naltrexone (DEPADE) 50 MG tablet [id number] Yes TAKE 1/2 TABLET (25 MG TOTAL) BY MOUTH DAILY (AM) Maury Dus, MD Taking Active   nicotine (NICODERM CQ - DOSED IN MG/24 HOURS) 14 mg/24hr patch [id number] No PLACE 1 PATCH (14 MG TOTAL) ONTO THE SKIN DAILY.  Patient not taking: Reported on 12/05/2022   Maury Dus, MD Not Taking Active   OLANZapine (ZYPREXA) 5 MG tablet [id number] Yes Take 1 tablet (5 mg total) by mouth at bedtime. Future refills will need to come from psychiatry. Maury Dus, MD Taking Active   pantoprazole (PROTONIX) 40 MG tablet [id number] Yes TAKE 1 TABLET (40 MG TOTAL) BY MOUTH DAILY (AM) Maury Dus, MD Taking Active   polyethylene glycol (MIRALAX / GLYCOLAX) 17 g packet [id number] Yes MIX AND DRINK BY MOUTH DAILY AS NEEDED FOR MODERATE CONSTIPATION Maury Dus, MD Taking Active   VENTOLIN HFA 108 (90 Base) MCG/ACT inhaler [id number] Yes Inhale into the lungs. [provider] Taking Active             Patient Active Problem List   Diagnosis Date Noted   Tachycardia 09/10/2022   Post-nasal drip 07/15/2022   Cognitive impairment 07/15/2022   Impaired instrumental activities of daily living (IADL) 07/15/2022   Requires assistance with activities of daily living (ADL) 07/15/2022   Ambulatory dysfunction 04/06/2022   Mood disorder 03/31/2022   Grade III internal hemorrhoids    Adenomatous polyp of descending colon    Iron deficiency anemia 03/04/2022   Seizure    Alcohol use disorder 10/21/2016   Gastroesophageal reflux disease 06/13/2016    Essential hypertension 04/01/2016   Asthma 04/01/2016   TOBACCO USER 06/13/2009    Conditions to be addressed/monitored per PCP order:   alcohol resources  There are no care plans that you recently modified to display for this patient.   Follow up:  Patient agrees to Care Plan and Follow-up.  Plan: The Managed Medicaid care management team will reach out to the patient again over the next 30 days.  Date/time of next scheduled Social Work care management/care coordination outreach:  01/13/23  Gus Puma, Kenard Gower, [facility] Health  Managed [facility] Social Worker [phone]

## 2022-12-15 NOTE — Patient Outreach (Signed)
BSW attempted to complete a telephone outreach. Mom asked to be called back at 3 or 4.  Abelino Derrick, MHA [facility] Health  Managed [facility] Social Worker [phone]

## 2022-12-16 ENCOUNTER — Other Ambulatory Visit: Payer: 59 | Admitting: Licensed Clinical Social Worker

## 2022-12-16 ENCOUNTER — Telehealth: Payer: Self-pay

## 2022-12-16 DIAGNOSIS — R4189 Other symptoms and signs involving cognitive functions and awareness: Secondary | ICD-10-CM | POA: Diagnosis not present

## 2022-12-16 NOTE — Progress Notes (Unsigned)
   Care Guide Note  12/16/2022 Name: Timothy Hubbard MRN: [id number] DOB: August 18, 1991  Referred by: Maury Dus, MD Reason for referral : Care Coordination (Outreach to schedule with Catie in the next 2-3 weeks )   Timothy Hubbard is a 32 y.o. year old male who is a primary care patient of Maury Dus, MD. Timothy Hubbard was referred to the pharmacist for assistance related to HTN.    An unsuccessful telephone outreach was attempted today to contact the patient who was referred to the pharmacy team for assistance with medication management. Additional attempts will be made to contact the patient.   Penne Lash, RMA Care Guide [facility]  [place], [place] [address] Direct Dial: [phone] [email or web address]

## 2022-12-16 NOTE — Patient Instructions (Signed)
Visit Information  Timothy Hubbard was given information about Medicaid Managed Care team care coordination services as a part of their [facility] Community Plan Medicaid benefit. Timothy Hubbard verbally consented to engagement with the [facility] Managed Care team.   If you are experiencing a medical emergency, please call 911 or report to your local emergency department or urgent care.   If you have a non-emergency medical problem during routine business hours, please contact your provider's office and ask to speak with a nurse.   For questions related to your [facility], please call: [phone] or visit the homepage here: [email or web address]  If you would like to schedule transportation through your [facility], please call the following number at least 2 days in advance of your appointment: [phone]   Rides for urgent appointments can also be made after hours by calling Member Services.  Call the Behavioral Health Crisis Line at [phone], at any time, 24 hours a day, 7 days a week. If you are in danger or need immediate medical attention call 911.  If you would like help to quit smoking, call 1-800-QUIT-NOW (564-611-9939) OR Espaol: 1-855-Djelo-Ya (9-983-382-5053) o para ms informacin haga clic aqu or Text READY to 976-734 to register via text  Following is a copy of your plan of care:  Care Plan : LCSW Plan of Care  Updates made by [name], LCSW since 12/16/2022 12:00 AM     Problem: Alcohol Use (Wellness)      Goal: Alcohol Use Managed   Start Date: 12/16/2022  Note:   Goal: Addictive Behavior Managed    Start Date: 12/16/2022 Timeframe:  Short Range Goal Priority:  High                 Expected End Date: ongoing                  Follow Up Date- 12/23/22 at 11:30 am   - avoid negative self-talk - be honest with myself - begin personal counseling -  check out recovery housing - exercise at least 2 to 3 times per week - identify a recovery coach - identify triggers for relapse - join AA (Alcoholics Anonymous)  - learn how to handle negative feelings without drugs or alcohol - make a list of pleasurable things to do without using alcohol or drugs - make a plan to deal with triggers like holidays and anniversaries - make a plan to fix broken relationships - make a plan to handle bad days - make a written relapse plan - spend time or talk with a recovery coach or support person at least 2 or 3 times every week - spend time or talk with people who do not drink or use every day    Why is this important?               It is possible that after you stop drinking or using drugs that you may slip and use again. This is called a relapse.              A relapse does not mean that you have failed. It means you have had one or a few bad days.             Make sure you know why it is important for you to stay sober and remind yourself often.              Once you figure  out what triggered the relapse and take the steps to deal with it, you can be even more hopeful that recovery is possible.     Current barriers:               Mental Health needs related to Depression and Alcohol Abuse             Currently unable to independently self manage needs related to mental health conditions.              Need for inpatient treatment             Lacks knowledge of where and how to connect for mental health/substance abuse support     Good Reasons for Drinking Less            I will live longer - probably 8-10 years.            I will sleep better.            I will be happier.            I will save a lot of money            My relationships will improve.            I will stay younger for longer.            I will achieve more in my life            There will be a greater chance that I will survive to a healthy old age with no premature damage to  my brain.                         I will be better at my job.            I will be less likely to feel depressed and commit suicide (6 times less likely).            I will be less likely to die of heart disease or cancer.            Other people will respect me            I will be less likely to get into trouble with the police.            The possibility that I will die of liver disease will be dramatically reduced (12 times less likely).            It will be less likely that I will die in a car accident (3 times less likely).   Strategies for Cutting Down Keep Track.  Find a way to keep track of how much you drink.  If you make a note of each drink before you drink it, this will help slow you down. Count and Measure.  Know the standard drink sizes.  Ask the bartender or server about the amount of alcohol in a mixed drink. Set Goals.  Decide how many days a week you will drink and how many drinks each day. Pace and Space.  When you do drink, pace yourself.  Have no more than one drink with alcohol per hour.  Alternate "drink spacers" non-alcoholic drinks such as water, soda, or juice with drinks containing alcohol. Include Food.  Don't drink on an empty stomach.  Have some food so the alcohol will be absorbed more slowly into your system.  Avoid Triggers.  Avoid people, places, or activities that have led to drinking in the past.  Certain times of day or feelings may also be triggers.  Make a plan so you will know what you can do instead of drinking. Plan to Handle Urges.  When an urge hits, consider these options:  Remind yourself of your reasons for changing.  Or talk it through with someone you trust. Or get involved with a healthy, distracting activity.  Or, "urge surf" - instead of fighting the feeling, accept it and ride it out, knowing it will soon crest like a wave and pass. Know Your "No".  Have a polite, convincing "no thanks" for those times when you may be offered a drink  and don't want one.  The faster you can say no to these offers, the less likely you are to give in.  If you hesitate, it allows you time to think of excuses to go along.        24- Hour Availability:      [facility]   [address] [place], [place] [phone] Crisis [phone]    [facility] [phone]   [place] [facility]  [phone]     [facility]  [phone] (after hours)    Therapeutic Alternative/Mobile Crisis   [phone]    [place] National Suicide Hotline  604-104-1052 Len Child[place]) [place] 564    Call 911 or go to emergency room    [facility]  [phone]);  [place] and [employer]  [phone]); [place], [place], [place], [place], [place], [place], [place]     Initial Goal

## 2022-12-16 NOTE — Patient Outreach (Signed)
Medicaid Managed Care Social Work Note  12/16/2022 Name:  Timothy Hubbard MRN:  [id number] DOB:  13-Dec-1990  Timothy Hubbard is an 32 y.o. year old male who is a primary patient of Maury Dus, MD.  The Medicaid Managed Care Coordination team was consulted for assistance with:  Community Resources  Level of Care Concerns  Timothy Hubbard was given information about Medicaid Managed Care Coordination team services today. Timothy Hubbard agreed to services and verbal consent obtained.  Engaged with patient  for by telephone forinitial visit in response to referral for case management and/or care coordination services.   Assessments/Interventions:  Review of past medical history, allergies, medications, health status, including review of consultants reports, laboratory and other test data, was performed as part of comprehensive evaluation and provision of chronic care management services.  SDOH: (Social Determinant of Health) assessments and interventions performed: SDOH Interventions    Flowsheet Row Patient Outreach Telephone from 12/16/2022 in [place] POPULATION HEALTH DEPARTMENT Patient Outreach Telephone from 12/05/2022 in [place] POPULATION HEALTH DEPARTMENT Patient Outreach Telephone from 11/02/2022 in [place] POPULATION HEALTH DEPARTMENT  SDOH Interventions     Food Insecurity Interventions -- -- Intervention Not Indicated  Housing Interventions -- -- Intervention Not Indicated  Transportation Interventions -- -- Intervention Not Indicated  Utilities Interventions -- -- Intervention Not Indicated  Alcohol Usage Interventions -- Other (Comment)  [LCSW referral] --  Stress Interventions Offered [employer], Provide Counseling -- --       Advanced Directives Status:  See Care Plan for related entries.  Care Plan                 Allergies  Allergen Reactions   Ibuprofen Anaphylaxis    Medications Reviewed Today     Reviewed by [name], RN  (Registered Nurse) on 12/05/22 at [phone]  Med List Status: <None>   Medication Order Taking? Sig Documenting Provider Last Dose Status Informant  acetaminophen (TYLENOL) 325 MG tablet [id number] Yes Take 2 tablets (650 mg total) by mouth every 6 (six) hours as needed for mild pain or moderate pain. Littie Deeds, MD Taking Active   albuterol (PROVENTIL) (2.5 MG/3ML) 0.083% nebulizer solution [id number] Yes USE (2.5 MG TOTAL) BY NEBULIZATION EVERY 6 (SIX) HOURS AS NEEDED FOR WHEEZING OR SHORTNESS OF BREATH. Maury Dus, MD Taking Active            Med Note (ROBB, MELANIE A   Mon Dec 05, 2022  9:16 AM)    amLODipine (NORVASC) 10 MG tablet [id number] Yes TAKE 1 TABLET (10 MG TOTAL) BY MOUTH DAILY (AM) Maury Dus, MD Taking Active   divalproex (DEPAKOTE) 500 MG DR tablet [id number] Yes TAKE ONE TABLET BY MOUTH TWICE DAILY (AM+BEDTIME) Maury Dus, MD Taking Active   ferrous sulfate (FEROSUL) 325 (65 FE) MG tablet [id number] Yes TAKE ONE TABLET BY MOUTH ONCE DAILY AT 12 NOON (NOON) Maury Dus, MD Taking Active   ferrous sulfate 324 (65 Fe) MG TBEC [id number] Yes Take 1 tablet (325 mg total) by mouth daily at 12 noon. Maury Dus, MD Taking Active   fluticasone [facility]) 50 MCG/ACT nasal spray [id number] Yes Place 2 sprays into both nostrils daily. Fayette Pho, MD Taking Active   folic acid (FOLVITE) 1 MG tablet [id number] Yes TAKE 1 TABLET (1 MG TOTAL) BY MOUTH DAILY (AM) Maury Dus, MD Taking Active   hydrocortisone (ANUSOL-HC) 2.5 % rectal cream [id number] No Place 1 Application rectally 2 (two) times daily.  Patient not  taking: Reported on 12/05/2022   Littie Deeds, MD Not Taking Active   lisinopril (ZESTRIL) 5 MG tablet [id number] Yes TAKE 1 TABLET (5 MG TOTAL) BY MOUTH DAILY (AM) Maury Dus, MD Taking Active   magnesium oxide (MAG-OX) 400 (240 Mg) MG tablet [id number] No Take 1 tablet by mouth 2 (two) times daily.  Patient not taking: Reported on 11/02/2022   [provider] Not Taking Active   mometasone-formoterol [facility]) 100-5 MCG/ACT Sandrea Matte [id number] Yes INHALE TWO PUFFS INTO THE LUNGS TWICE A Annia Friendly, MD Taking Active   Multiple Vitamin (MULTIVITAMIN WITH MINERALS) TABS tablet [id number] Yes Take 1 tablet by mouth daily. Pokhrel, Laxman, MD Taking Active   naltrexone (DEPADE) 50 MG tablet [id number] Yes TAKE 1/2 TABLET (25 MG TOTAL) BY MOUTH DAILY (AM) Maury Dus, MD Taking Active   nicotine (NICODERM CQ - DOSED IN MG/24 HOURS) 14 mg/24hr patch [id number] No PLACE 1 PATCH (14 MG TOTAL) ONTO THE SKIN DAILY.  Patient not taking: Reported on 12/05/2022   Maury Dus, MD Not Taking Active   OLANZapine (ZYPREXA) 5 MG tablet [id number] Yes Take 1 tablet (5 mg total) by mouth at bedtime. Future refills will need to come from psychiatry. Maury Dus, MD Taking Active   pantoprazole (PROTONIX) 40 MG tablet [id number] Yes TAKE 1 TABLET (40 MG TOTAL) BY MOUTH DAILY (AM) Maury Dus, MD Taking Active   polyethylene glycol (MIRALAX / GLYCOLAX) 17 g packet [id number] Yes MIX AND DRINK BY MOUTH DAILY AS NEEDED FOR MODERATE CONSTIPATION Maury Dus, MD Taking Active   VENTOLIN HFA 108 (90 Base) MCG/ACT inhaler [id number] Yes Inhale into the lungs. [provider] Taking Active             Patient Active Problem List   Diagnosis Date Noted   Tachycardia 09/10/2022   Post-nasal drip 07/15/2022   Cognitive impairment 07/15/2022   Impaired instrumental activities of daily living (IADL) 07/15/2022   Requires assistance with activities of daily living (ADL) 07/15/2022   Ambulatory dysfunction 04/06/2022   Mood disorder 03/31/2022   Grade III internal hemorrhoids    Adenomatous polyp of descending colon    Iron deficiency anemia 03/04/2022   Seizure    Alcohol use disorder 10/21/2016   Gastroesophageal reflux disease 06/13/2016   Essential hypertension 04/01/2016   Asthma 04/01/2016   TOBACCO USER 06/13/2009     Conditions to be addressed/monitored per PCP order:   Alcohol Abuse  Care Plan : LCSW Plan of Care  Updates made by Timothy Bryant, LCSW since 12/16/2022 12:00 AM     Problem: Alcohol Use (Wellness)      Goal: Alcohol Use Managed   Start Date: 12/16/2022  Note:   Goal: Addictive Behavior Managed    Start Date: 12/16/2022 Timeframe:  Short Range Goal Priority:  High                 Expected End Date: ongoing                  Follow Up Date- 12/23/22 at 11:30 am   - avoid negative self-talk - be honest with myself - begin personal counseling - check out recovery housing - exercise at least 2 to 3 times per week - identify a recovery coach - identify triggers for relapse - join AA (Alcoholics Anonymous)  - learn how to handle negative feelings without drugs or alcohol - make a list of pleasurable things to do without using alcohol or drugs - make a  plan to deal with triggers like holidays and anniversaries - make a plan to fix broken relationships - make a plan to handle bad days - make a written relapse plan - spend time or talk with a recovery coach or support person at least 2 or 3 times every week - spend time or talk with people who do not drink or use every day    Why is this important?               It is possible that after you stop drinking or using drugs that you may slip and use again. This is called a relapse.              A relapse does not mean that you have failed. It means you have had one or a few bad days.             Make sure you know why it is important for you to stay sober and remind yourself often.              Once you figure out what triggered the relapse and take the steps to deal with it, you can be even more hopeful that recovery is possible.     Current barriers:               Mental Health needs related to Depression and Alcohol Abuse             Currently unable to independently self manage needs related to mental health conditions.               Need for inpatient treatment             Lacks knowledge of where and how to connect for mental health/substance abuse support   Clinical Goal(s): Over the next 90 days, patient will work with SW to reduce or manage symptoms of substance abuse and will assist with securing inpatient treatment.    Clinical Interventions:              Assessed patient's previous treatment, needs, coping skills, current treatment, support system and barriers to care. Patient has never had any substance abuse treatment             Patient's mother was interviewed and appropriate assessments performed or reviewed: brief mental health assessment;Suicidal Ideation/Homicidal Ideation: No             Provided basic mental health support, education and interventions             Patient prefers for mother to be POC for [facility] LCSW. Patient is wanting to stop drinking alcohol but cannot find inpatient treatment somewhere that accepts his Medicaid, has a bed and is willing to take him as a patient. Patient will need to complete detox first before he can gain inpatient treatment. Patient can go to [facility], [facility] or [facility] to detox and then can go to Inpatient Treatment at his preferred facility at [facility]. SECURE email sent to patient's mother.              Relapse prevention resource education provided.              Discussed several options for long term counseling based on need and insurance              [facility] LCSW educated patient on medication therapy that can help with alcohol triggers.  Other interventions include: Motivational Interviewing             Solution-Focused Strategies             Mindfulness or Management consultant  Inter-disciplinary care team collaboration (see longitudinal plan of care)             [facility] LCSW educated patient on healthy coping skills to implement into her daily routine to combat cravings and depressive symptoms once they arise.     Patient Goals/Self-Care Activities: Over the next 90 days             Implement interventions discussed today to decreases alcohol usage, symptoms of stress and increase knowledge and/or ability of: coping skills, healthy habits, self-management skills, and stress reduction.   Consider becoming involved in a structured program.  You should stop drinking if:            You have tried to cut down before but have not been successful, or                  You suffer from morning shakes during a heavy drinking period, or            You have high blood pressure, or            You are pregnant, or            You have liver disease            Your alcohol use is affecting your social relationships, or            You have legal consequences like DUIs, or            You call in sick to work, or            You cannot take care of our children, or            Someone close to you says you drink too much     How Much Alcohol is a Drink: Beer: 12 oz. = 1 drink 16 oz. = 1.3 drinks 22 oz. = 2 drinks 40 oz. = 3.3 drinks   Wine: 5 oz. = 1 drink 740 mL (25 oz.) bottle = 5 drinks  Malt Liquor: 12 oz. = 1.5 drinks 16 oz. = 2 drinks 22 oz. = 2.5 drinks 40 oz. = 4.5 drinks   80-Proof Spirits - Hard Liquor: 1 shot = 1 drink 1 mixed drink = number of shots Can equal 1-3 drinks   What is Low-risk Drinking?            Have no more than 2 drinks of alcohol per day            Drink no more than 5 days per week            Do not drink alcohol drink alcohol when:               Identify Your Triggers for Drinking            Parties            Particular People            Feeling lonely            Feeling tense            Family problems            Feeling sad  Feeling happy             Feeling bored            After work            Problems sleeping            Criticism            Feelings of failure            After being paid            When others are drinking                  In bars            When out for dinner            After arguments            Weekends            Feeling restless            Being in pain   Effects of High-Risk Drinking To the Brain:            Aggressive, irrational behavior            Arguments, violence            Depression, nervousness            Alcohol dependence, memory loss To the Nervous System:            Trembling hands, tingling fingers            Numbness, painful nerves            Impaired sensation leading to falls            Numb tingling toes To Your Lifestyle:            Social, legal, medical problems            Domestic trouble/relationship loss            Job loss & financial problems            Shortened life span            Accidents and death from drunk driving               To the Face:            Premature aging, drinker's nose            Cancer of the throat & mouth To the Body:            Frequent cold            Reduced resistance to infection            Increased risk of pneumonia            Weakness of heart muscle            Heart failure, anemia            Impaired blood clotting            Breast cancer            Vitamin deficiency, bleeding            Severe Inflammation of the stomach            Vomiting, diarrhea, malnutrition            Ulcer, inflammation of the pancreas  Impaired sexual performance            Birth defects, including deformities, retardation, and low birthweight   Ways to Cope Without Drinking            Go home if you tend to drink after work            Find another activity            Switch to nonalcoholic beverages            Change friends            Join a Stage manager            Visit relatives            Plan/take a trip               Go for a walk            Take up a hobby            Listen to music            Talk to a friend            Reading            What would you do if you had no worries about failing?                Good Reasons for Drinking Less            I will live longer - probably 8-10 years.            I will sleep better.            I will be happier.            I will save a lot of money            My relationships will improve.            I will stay younger for longer.            I will achieve more in my life            There will be a greater chance that I will survive to a healthy old age with no premature damage to my brain.                         I will be better at my job.            I will be less likely to feel depressed and commit suicide (6 times less likely).            I will be less likely to die of heart disease or cancer.            Other people will respect me            I will be less likely to get into trouble with the police.            The possibility that I will die of liver disease will be dramatically reduced (12 times less likely).            It will be less likely that I will die in a car accident (3 times less likely).   Strategies for Cutting Down Keep Track.  Find a way to keep track  of how much you drink.  If you make a note of each drink before you drink it, this will help slow you down. Count and Measure.  Know the standard drink sizes.  Ask the bartender or server about the amount of alcohol in a mixed drink. Set Goals.  Decide how many days a week you will drink and how many drinks each day. Pace and Space.  When you do drink, pace yourself.  Have no more than one drink with alcohol per hour.  Alternate "drink spacers" non-alcoholic drinks such as water, soda, or juice with drinks containing alcohol. Include Food.  Don't drink on an empty stomach.  Have some food so the alcohol will be absorbed more slowly into your system.             Avoid Triggers.  Avoid people, places, or activities that have led to drinking in the past.  Certain times of day or feelings may also be triggers.  Make a plan so you will know what you can do instead of  drinking. Plan to Handle Urges.  When an urge hits, consider these options:  Remind yourself of your reasons for changing.  Or talk it through with someone you trust. Or get involved with a healthy, distracting activity.  Or, "urge surf" - instead of fighting the feeling, accept it and ride it out, knowing it will soon crest like a wave and pass. Know Your "No".  Have a polite, convincing "no thanks" for those times when you may be offered a drink and don't want one.  The faster you can say no to these offers, the less likely you are to give in.  If you hesitate, it allows you time to think of excuses to go along.        24- Hour Availability:      [facility]   [address] [place], [place] [phone] Crisis [phone]    [facility] [phone]   [facility]  [phone]     [facility]  [phone] (after hours)    Therapeutic Alternative/Mobile Crisis   [phone]    [place] National Suicide Hotline  6178785943 Len Childs) [place] 884    Call 911 or go to emergency room    [facility]  [phone]);  [place] and [employer]  [phone]); [place], [place], [place], [place], [place], [place], [place]     Initial Goal      Follow up:  Patient agrees to Care Plan and Follow-up.  Plan: The Managed Medicaid care management team will reach out to the patient again over the next 30 days.  Date/time of next scheduled Social Work care management/care coordination outreach:  12/23/22 at 1130 am  Dickie La, BSW, MSW, [employer] Managed Medicaid LCSW [facility]  [employer] [email or web address] Phone: [phone]

## 2022-12-17 DIAGNOSIS — R4189 Other symptoms and signs involving cognitive functions and awareness: Secondary | ICD-10-CM | POA: Diagnosis not present

## 2022-12-18 DIAGNOSIS — R4189 Other symptoms and signs involving cognitive functions and awareness: Secondary | ICD-10-CM | POA: Diagnosis not present

## 2022-12-19 ENCOUNTER — Other Ambulatory Visit: Payer: 59 | Admitting: Pharmacist

## 2022-12-19 DIAGNOSIS — R4189 Other symptoms and signs involving cognitive functions and awareness: Secondary | ICD-10-CM | POA: Diagnosis not present

## 2022-12-19 NOTE — Progress Notes (Signed)
Care Coordination Call  Received referral to help with medication management. Patient's mother notes that patient is getting ready to go to an inpatient rehab facility in [place] likely on Wednesday, is hoping that his medications can be adherence packaged for 90 day supplies.   Patent examiner. Vishal notes that if 90 day supply refills are sent over tomorrow, they should be able to fill ~ next week (last month's packages were filled on 3/25). Patient's mother notes that she can pick up the packages next week and deliver to [facility] in [place].   Catie Eppie Gibson, PharmD, BCACP, CPP [facility] [phone]

## 2022-12-20 ENCOUNTER — Ambulatory Visit: Payer: 59 | Admitting: Family Medicine

## 2022-12-20 DIAGNOSIS — R4189 Other symptoms and signs involving cognitive functions and awareness: Secondary | ICD-10-CM | POA: Diagnosis not present

## 2022-12-21 DIAGNOSIS — R4189 Other symptoms and signs involving cognitive functions and awareness: Secondary | ICD-10-CM | POA: Diagnosis not present

## 2022-12-22 DIAGNOSIS — R4189 Other symptoms and signs involving cognitive functions and awareness: Secondary | ICD-10-CM | POA: Diagnosis not present

## 2022-12-23 ENCOUNTER — Other Ambulatory Visit: Payer: Self-pay | Admitting: *Deleted

## 2022-12-23 ENCOUNTER — Other Ambulatory Visit: Payer: Self-pay | Admitting: Family Medicine

## 2022-12-23 ENCOUNTER — Other Ambulatory Visit: Payer: 59 | Admitting: Licensed Clinical Social Worker

## 2022-12-23 DIAGNOSIS — R4189 Other symptoms and signs involving cognitive functions and awareness: Secondary | ICD-10-CM | POA: Diagnosis not present

## 2022-12-23 DIAGNOSIS — R262 Difficulty in walking, not elsewhere classified: Secondary | ICD-10-CM

## 2022-12-23 DIAGNOSIS — F1090 Alcohol use, unspecified, uncomplicated: Secondary | ICD-10-CM

## 2022-12-23 DIAGNOSIS — D509 Iron deficiency anemia, unspecified: Secondary | ICD-10-CM

## 2022-12-23 DIAGNOSIS — R569 Unspecified convulsions: Secondary | ICD-10-CM

## 2022-12-23 MED ORDER — AMLODIPINE BESYLATE 10 MG PO TABS: ORAL_TABLET | ORAL | 0 refills | Status: DC

## 2022-12-23 MED ORDER — OLANZAPINE 5 MG PO TABS: 5.0000 mg | ORAL_TABLET | Freq: Every day | ORAL | 0 refills | Status: DC

## 2022-12-23 MED ORDER — DIVALPROEX SODIUM 500 MG PO DR TAB: 500.0000 mg | DELAYED_RELEASE_TABLET | Freq: Two times a day (BID) | ORAL | 0 refills | Status: DC

## 2022-12-23 MED ORDER — LISINOPRIL 5 MG PO TABS: ORAL_TABLET | ORAL | 0 refills | Status: DC

## 2022-12-23 MED ORDER — FOLIC ACID 1 MG PO TABS: ORAL_TABLET | ORAL | 0 refills | Status: DC

## 2022-12-23 MED ORDER — FERROUS SULFATE 324 (65 FE) MG PO TBEC: 1.0000 | DELAYED_RELEASE_TABLET | Freq: Every day | ORAL | 0 refills | Status: DC

## 2022-12-23 MED ORDER — DULERA 100-5 MCG/ACT IN AERO: INHALATION_SPRAY | RESPIRATORY_TRACT | 0 refills | Status: DC

## 2022-12-23 MED ORDER — PANTOPRAZOLE SODIUM 40 MG PO TBEC: DELAYED_RELEASE_TABLET | ORAL | 0 refills | Status: DC

## 2022-12-23 MED ORDER — NALTREXONE HCL 50 MG PO TABS
ORAL_TABLET | ORAL | 0 refills | Status: DC
Start: 2022-12-23 — End: 2023-04-04

## 2022-12-23 NOTE — Patient Instructions (Signed)
Ms. Franki Monte - thank you for speaking with me today by phone to discuss Mr. Wickham needs related to medication refills and health plan updates. Please don't hesitate to reach out to [place] at [phone] if you should need further assistance.   Marja Kays MHA,BSN,RN,CCM Assistant Clinical Director  [place]  Chronic Care Management  Managed [facility]  Care Coordination

## 2022-12-23 NOTE — Progress Notes (Signed)
Received message from pharmacist requesting 90 day supply of patient's medications prior to him entering inpatient rehab facility. See below. Medications sent as requested. Patient will need to be seen in the office after discharge from rehab.  Maury Dus, MD PGY-3, [facility]    "Care Coordination Call   Received referral to help with medication management. Patient's mother notes that patient is getting ready to go to an inpatient rehab facility in [place] likely on Wednesday, is hoping that his medications can be adherence packaged for 90 day supplies.    Patent examiner. Vishal notes that if 90 day supply refills are sent over tomorrow, they should be able to fill ~ next week (last month's packages were filled on 3/25). Patient's mother notes that she can pick up the packages next week and deliver to [facility] in [place].    Catie Eppie Gibson, PharmD, BCACP, CPP [facility] [phone]"

## 2022-12-23 NOTE — Patient Outreach (Signed)
Medicaid Managed Care Social Work Note  12/23/2022 Name:  Timothy Hubbard MRN:  [id number] DOB:  Mar 08, 1991  Timothy Hubbard is an 32 y.o. year old male who is a primary patient of Maury Dus, MD.  The Medicaid Managed Care Coordination team was consulted for assistance with:  Mental Health Counseling and Resources  Timothy Hubbard was given information about Medicaid Managed Care Coordination team services today. Timothy Hubbard agreed to services and verbal consent obtained.  Engaged with patient  for by telephone forfollow up visit in response to referral for case management and/or care coordination services.   Assessments/Interventions:  Review of past medical history, allergies, medications, health status, including review of consultants reports, laboratory and other test data, was performed as part of comprehensive evaluation and provision of chronic care management services.  SDOH: (Social Determinant of Health) assessments and interventions performed: SDOH Interventions    Flowsheet Row Patient Outreach Telephone from 12/16/2022 in [place] POPULATION HEALTH DEPARTMENT Patient Outreach Telephone from 12/05/2022 in [place] POPULATION HEALTH DEPARTMENT Patient Outreach Telephone from 11/02/2022 in [place] POPULATION HEALTH DEPARTMENT  SDOH Interventions     Food Insecurity Interventions -- -- Intervention Not Indicated  Housing Interventions -- -- Intervention Not Indicated  Transportation Interventions -- -- Intervention Not Indicated  Utilities Interventions -- -- Intervention Not Indicated  Alcohol Usage Interventions -- Other (Comment)  [LCSW referral] --  Stress Interventions Offered [employer], Provide Counseling -- --       Advanced Directives Status:  See Care Plan for related entries.  Care Plan                 Allergies  Allergen Reactions   Ibuprofen Anaphylaxis    Medications Reviewed Today     Reviewed by [name], RN  (Registered Nurse) on 12/05/22 at [phone]  Med List Status: <None>   Medication Order Taking? Sig Documenting Provider Last Dose Status Informant  acetaminophen (TYLENOL) 325 MG tablet [id number] Yes Take 2 tablets (650 mg total) by mouth every 6 (six) hours as needed for mild pain or moderate pain. Littie Deeds, MD Taking Active   albuterol (PROVENTIL) (2.5 MG/3ML) 0.083% nebulizer solution [id number] Yes USE (2.5 MG TOTAL) BY NEBULIZATION EVERY 6 (SIX) HOURS AS NEEDED FOR WHEEZING OR SHORTNESS OF BREATH. Maury Dus, MD Taking Active            Med Note (ROBB, MELANIE A   Mon Dec 05, 2022  9:16 AM)    amLODipine (NORVASC) 10 MG tablet [id number] Yes TAKE 1 TABLET (10 MG TOTAL) BY MOUTH DAILY (AM) Maury Dus, MD Taking Active   divalproex (DEPAKOTE) 500 MG DR tablet [id number] Yes TAKE ONE TABLET BY MOUTH TWICE DAILY (AM+BEDTIME) Maury Dus, MD Taking Active   ferrous sulfate (FEROSUL) 325 (65 FE) MG tablet [id number] Yes TAKE ONE TABLET BY MOUTH ONCE DAILY AT 12 NOON (NOON) Maury Dus, MD Taking Active   ferrous sulfate 324 (65 Fe) MG TBEC [id number] Yes Take 1 tablet (325 mg total) by mouth daily at 12 noon. Maury Dus, MD Taking Active   fluticasone [facility]) 50 MCG/ACT nasal spray [id number] Yes Place 2 sprays into both nostrils daily. Fayette Pho, MD Taking Active   folic acid (FOLVITE) 1 MG tablet [id number] Yes TAKE 1 TABLET (1 MG TOTAL) BY MOUTH DAILY (AM) Maury Dus, MD Taking Active   hydrocortisone (ANUSOL-HC) 2.5 % rectal cream [id number] No Place 1 Application rectally 2 (two) times daily.  Patient not taking:  Reported on 12/05/2022   Littie Deeds, MD Not Taking Active   lisinopril (ZESTRIL) 5 MG tablet [id number] Yes TAKE 1 TABLET (5 MG TOTAL) BY MOUTH DAILY (AM) Maury Dus, MD Taking Active   magnesium oxide (MAG-OX) 400 (240 Mg) MG tablet [id number] No Take 1 tablet by mouth 2 (two) times daily.  Patient not taking: Reported on 11/02/2022   [provider] Not Taking Active   mometasone-formoterol [facility]) 100-5 MCG/ACT Sandrea Matte [id number] Yes INHALE TWO PUFFS INTO THE LUNGS TWICE A Annia Friendly, MD Taking Active   Multiple Vitamin (MULTIVITAMIN WITH MINERALS) TABS tablet [id number] Yes Take 1 tablet by mouth daily. Pokhrel, Laxman, MD Taking Active   naltrexone (DEPADE) 50 MG tablet [id number] Yes TAKE 1/2 TABLET (25 MG TOTAL) BY MOUTH DAILY (AM) Maury Dus, MD Taking Active   nicotine (NICODERM CQ - DOSED IN MG/24 HOURS) 14 mg/24hr patch [id number] No PLACE 1 PATCH (14 MG TOTAL) ONTO THE SKIN DAILY.  Patient not taking: Reported on 12/05/2022   Maury Dus, MD Not Taking Active   OLANZapine (ZYPREXA) 5 MG tablet [id number] Yes Take 1 tablet (5 mg total) by mouth at bedtime. Future refills will need to come from psychiatry. Maury Dus, MD Taking Active   pantoprazole (PROTONIX) 40 MG tablet [id number] Yes TAKE 1 TABLET (40 MG TOTAL) BY MOUTH DAILY (AM) Maury Dus, MD Taking Active   polyethylene glycol (MIRALAX / GLYCOLAX) 17 g packet [id number] Yes MIX AND DRINK BY MOUTH DAILY AS NEEDED FOR MODERATE CONSTIPATION Maury Dus, MD Taking Active   VENTOLIN HFA 108 (90 Base) MCG/ACT inhaler [id number] Yes Inhale into the lungs. [provider] Taking Active             Patient Active Problem List   Diagnosis Date Noted   Tachycardia 09/10/2022   Post-nasal drip 07/15/2022   Cognitive impairment 07/15/2022   Impaired instrumental activities of daily living (IADL) 07/15/2022   Requires assistance with activities of daily living (ADL) 07/15/2022   Ambulatory dysfunction 04/06/2022   Mood disorder 03/31/2022   Grade III internal hemorrhoids    Adenomatous polyp of descending colon    Iron deficiency anemia 03/04/2022   Seizure    Alcohol use disorder 10/21/2016   Gastroesophageal reflux disease 06/13/2016   Essential hypertension 04/01/2016   Asthma 04/01/2016   TOBACCO USER 06/13/2009   Care Plan :  LCSW Plan of Care  Updates made by [name], LCSW since 12/23/2022 12:00 AM     Problem: Alcohol Use (Wellness)      Goal: Alcohol Use Managed   Start Date: 12/16/2022  Note:   Goal: Addictive Behavior Managed    Start Date: 12/16/2022 Timeframe:  Short Range Goal Priority:  High                 Expected End Date: ongoing                  Follow Up Date-    - avoid negative self-talk - be honest with myself - begin personal counseling - check out recovery housing - exercise at least 2 to 3 times per week - identify a recovery coach - identify triggers for relapse - join AA (Alcoholics Anonymous)  - learn how to handle negative feelings without drugs or alcohol - make a list of pleasurable things to do without using alcohol or drugs - make a plan to deal with triggers like holidays and anniversaries - make a plan to fix broken relationships -  make a plan to handle bad days - make a written relapse plan - spend time or talk with a recovery coach or support person at least 2 or 3 times every week - spend time or talk with people who do not drink or use every day    Why is this important?               It is possible that after you stop drinking or using drugs that you may slip and use again. This is called a relapse.              A relapse does not mean that you have failed. It means you have had one or a few bad days.             Make sure you know why it is important for you to stay sober and remind yourself often.              Once you figure out what triggered the relapse and take the steps to deal with it, you can be even more hopeful that recovery is possible.     Current barriers:               Mental Health needs related to Depression and Alcohol Abuse             Currently unable to independently self manage needs related to mental health conditions.             Need for inpatient treatment especially now that insurance change recently happened              Lacks knowledge of where and how to connect for mental health/substance abuse support   Clinical Goal(s): Over the next 90 days, patient will work with SW to reduce or manage symptoms of substance abuse and will assist with securing inpatient treatment.    Clinical Interventions:              Assessed patient's previous treatment, needs, coping skills, current treatment, support system and barriers to care. Patient has never had any substance abuse treatment             Patient's mother was interviewed and appropriate assessments performed or reviewed: brief mental health assessment;Suicidal Ideation/Homicidal Ideation: No             Provided basic mental health support, education and interventions             Patient prefers for mother to be POC for [facility] LCSW. Patient is wanting to stop drinking alcohol but cannot find inpatient treatment somewhere that accepts his Medicaid, has a bed and is willing to take him as a patient. Patient will need to complete detox first before he can gain inpatient treatment. Patient can go to [facility], [facility] or [facility] [facility] to detox and then can go to Inpatient Treatment at his preferred facility at [facility]. SECURE email sent to patient's mother.              Relapse prevention resource education provided.              Discussed several options for long term counseling based on need and insurance              [facility] LCSW educated patient on medication therapy that can help with alcohol triggers.              Other  interventions include: Motivational Interviewing             Solution-Focused Strategies             Mindfulness or Management consultant  Inter-disciplinary care team collaboration (see longitudinal plan of care)             [facility] LCSW educated patient on healthy coping skills to implement into her daily routine to combat cravings and depressive symptoms once they arise. [facility] LCSW update- Patient's mother reports being  very upset because she feels that someone from our team or through [facility] affected patient from gaining inpatient treatment. [facility] LCSW completed care coordination with Pharmacist, PCP and [facility] team director     Patient Goals/Self-Care Activities: Over the next 90 days             Implement interventions discussed today to decreases alcohol usage, symptoms of stress and increase knowledge and/or ability of: coping skills, healthy habits, self-management skills, and stress reduction.   Consider becoming involved in a structured program.  You should stop drinking if:            You have tried to cut down before but have not been successful, or                  You suffer from morning shakes during a heavy drinking period, or            You have high blood pressure, or            You are pregnant, or            You have liver disease            Your alcohol use is affecting your social relationships, or            You have legal consequences like DUIs, or            You call in sick to work, or            You cannot take care of our children, or            Someone close to you says you drink too much     How Much Alcohol is a Drink: Beer: 12 oz. = 1 drink 16 oz. = 1.3 drinks 22 oz. = 2 drinks 40 oz. = 3.3 drinks   Wine: 5 oz. = 1 drink 740 mL (25 oz.) bottle = 5 drinks  Malt Liquor: 12 oz. = 1.5 drinks 16 oz. = 2 drinks 22 oz. = 2.5 drinks 40 oz. = 4.5 drinks   80-Proof Spirits - Hard Liquor: 1 shot = 1 drink 1 mixed drink = number of shots Can equal 1-3 drinks   What is Low-risk Drinking?            Have no more than 2 drinks of alcohol per day            Drink no more than 5 days per week            Do not drink alcohol drink alcohol when:               Identify Your Triggers for Drinking            Parties            Particular People            Feeling lonely  Feeling tense            Family problems            Feeling sad            Feeling happy              Feeling bored            After work            Problems sleeping            Criticism            Feelings of failure            After being paid            When others are drinking                 In bars            When out for dinner            After arguments            Weekends            Feeling restless            Being in pain   Effects of High-Risk Drinking To the Brain:            Aggressive, irrational behavior            Arguments, violence            Depression, nervousness            Alcohol dependence, memory loss To the Nervous System:            Trembling hands, tingling fingers            Numbness, painful nerves            Impaired sensation leading to falls            Numb tingling toes To Your Lifestyle:            Social, legal, medical problems            Domestic trouble/relationship loss            Job loss & financial problems            Shortened life span            Accidents and death from drunk driving               To the Face:            Premature aging, drinker's nose            Cancer of the throat & mouth To the Body:            Frequent cold            Reduced resistance to infection            Increased risk of pneumonia            Weakness of heart muscle            Heart failure, anemia            Impaired blood clotting            Breast cancer            Vitamin deficiency, bleeding  Severe Inflammation of the stomach            Vomiting, diarrhea, malnutrition            Ulcer, inflammation of the pancreas            Impaired sexual performance            Birth defects, including deformities, retardation, and low birthweight   Ways to Cope Without Drinking            Go home if you tend to drink after work            Find another activity            Switch to nonalcoholic beverages            Change friends            Join a Stage manager            Visit relatives            Plan/take a trip                Go for a walk            Take up a hobby            Listen to music            Talk to a friend            Reading            What would you do if you had no worries about failing?               Good Reasons for Drinking Less            I will live longer - probably 8-10 years.            I will sleep better.            I will be happier.            I will save a lot of money            My relationships will improve.            I will stay younger for longer.            I will achieve more in my life            There will be a greater chance that I will survive to a healthy old age with no premature damage to my brain.                         I will be better at my job.            I will be less likely to feel depressed and commit suicide (6 times less likely).            I will be less likely to die of heart disease or cancer.            Other people will respect me            I will be less likely to get into trouble with the police.            The possibility that I will die of liver disease will be dramatically reduced (12  times less likely).            It will be less likely that I will die in a car accident (3 times less likely).   Strategies for Cutting Down Keep Track.  Find a way to keep track of how much you drink.  If you make a note of each drink before you drink it, this will help slow you down. Count and Measure.  Know the standard drink sizes.  Ask the bartender or server about the amount of alcohol in a mixed drink. Set Goals.  Decide how many days a week you will drink and how many drinks each day. Pace and Space.  When you do drink, pace yourself.  Have no more than one drink with alcohol per hour.  Alternate "drink spacers" non-alcoholic drinks such as water, soda, or juice with drinks containing alcohol. Include Food.  Don't drink on an empty stomach.  Have some food so the alcohol will be absorbed more slowly into your system.             Avoid  Triggers.  Avoid people, places, or activities that have led to drinking in the past.  Certain times of day or feelings may also be triggers.  Make a plan so you will know what you can do instead of drinking. Plan to Handle Urges.  When an urge hits, consider these options:  Remind yourself of your reasons for changing.  Or talk it through with someone you trust. Or get involved with a healthy, distracting activity.  Or, "urge surf" - instead of fighting the feeling, accept it and ride it out, knowing it will soon crest like a wave and pass. Know Your "No".  Have a polite, convincing "no thanks" for those times when you may be offered a drink and don't want one.  The faster you can say no to these offers, the less likely you are to give in.  If you hesitate, it allows you time to think of excuses to go along.        24- Hour Availability:      [facility]   [address] [place], [place] [phone] Crisis [phone]    [facility] [phone]   [place] [facility]  [phone]     [facility]  [phone] (after hours)    Therapeutic Alternative/Mobile Crisis   [phone]    [place] National Suicide Hotline  4106237821 Len Childs) [place] [address]    Call 911 or go to emergency room    [facility]  [phone]);  [place] and [employer]  [phone]); [place], [place], [place], [place], [place], [place], [place]     Initial Goal     Dickie La, [place], MSW, [employer] Managed Medicaid LCSW [facility]  [email or web address] Phone: [phone]

## 2022-12-23 NOTE — Patient Outreach (Signed)
   12/23/2022  Cedrik Heindl Ranker July 11, 1991 [id number]  I spoke with Ms. Franki Monte, parent of Mr. Dymek by phone today to address her questions/concerns re: medication refills and insurance listed in the medical record. Ms. Franki Monte indicated that Mr. Brannan's active health plan is currently [facility] only. I advised Ms. Deese that I would follow up with the [facility] billing department and request an update to Mr. Amory's record.   Ms. Franki Monte confirmed that Mr. Bittinger has all prescribed medications on hand and is not due for refills until 12/30/22.   Ms. Franki Monte expressed her appreciation for the assistance from the team.   Plan: 01/06/2023 9am Telephone Visit with RN Care Manager.   Marja Kays MHA,BSN,RN,CCM Assistant Clinical Director  [place]  Chronic Care Management  Managed [facility]  Care Coordination

## 2022-12-24 DIAGNOSIS — R4189 Other symptoms and signs involving cognitive functions and awareness: Secondary | ICD-10-CM | POA: Diagnosis not present

## 2022-12-25 DIAGNOSIS — F17210 Nicotine dependence, cigarettes, uncomplicated: Secondary | ICD-10-CM | POA: Diagnosis not present

## 2022-12-25 DIAGNOSIS — F1020 Alcohol dependence, uncomplicated: Secondary | ICD-10-CM | POA: Diagnosis not present

## 2022-12-25 DIAGNOSIS — F10229 Alcohol dependence with intoxication, unspecified: Secondary | ICD-10-CM | POA: Diagnosis not present

## 2022-12-25 DIAGNOSIS — R4189 Other symptoms and signs involving cognitive functions and awareness: Secondary | ICD-10-CM | POA: Diagnosis not present

## 2022-12-25 DIAGNOSIS — F319 Bipolar disorder, unspecified: Secondary | ICD-10-CM | POA: Diagnosis not present

## 2022-12-25 DIAGNOSIS — F1220 Cannabis dependence, uncomplicated: Secondary | ICD-10-CM | POA: Diagnosis not present

## 2022-12-25 DIAGNOSIS — F1310 Sedative, hypnotic or anxiolytic abuse, uncomplicated: Secondary | ICD-10-CM | POA: Insufficient documentation

## 2022-12-25 DIAGNOSIS — F1411 Cocaine abuse, in remission: Secondary | ICD-10-CM | POA: Insufficient documentation

## 2022-12-26 ENCOUNTER — Telehealth: Payer: Self-pay

## 2022-12-26 ENCOUNTER — Other Ambulatory Visit: Payer: Self-pay | Admitting: Family Medicine

## 2022-12-26 DIAGNOSIS — R4189 Other symptoms and signs involving cognitive functions and awareness: Secondary | ICD-10-CM | POA: Diagnosis not present

## 2022-12-26 DIAGNOSIS — F10239 Alcohol dependence with withdrawal, unspecified: Secondary | ICD-10-CM | POA: Diagnosis not present

## 2022-12-26 DIAGNOSIS — F1020 Alcohol dependence, uncomplicated: Secondary | ICD-10-CM | POA: Diagnosis not present

## 2022-12-26 DIAGNOSIS — F1220 Cannabis dependence, uncomplicated: Secondary | ICD-10-CM | POA: Diagnosis not present

## 2022-12-26 DIAGNOSIS — F1310 Sedative, hypnotic or anxiolytic abuse, uncomplicated: Secondary | ICD-10-CM | POA: Diagnosis not present

## 2022-12-26 DIAGNOSIS — F319 Bipolar disorder, unspecified: Secondary | ICD-10-CM | POA: Diagnosis not present

## 2022-12-26 DIAGNOSIS — F17210 Nicotine dependence, cigarettes, uncomplicated: Secondary | ICD-10-CM | POA: Diagnosis not present

## 2022-12-26 NOTE — Patient Instructions (Signed)
Visit Information  Timothy Hubbard was given information about Medicaid Managed Care team care coordination services as a part of their [facility] Community Plan Medicaid benefit. Timothy Hubbard's mother verbally consented to engagement with the [facility] Managed Care team.   If you are experiencing a medical emergency, please call 911 or report to your local emergency department or urgent care.   If you have a non-emergency medical problem during routine business hours, please contact your provider's office and ask to speak with a nurse.   For questions related to your [facility], please call: [phone] or visit the homepage here: [email or web address]  If you would like to schedule transportation through your [facility], please call the following number at least 2 days in advance of your appointment: [phone]   Rides for urgent appointments can also be made after hours by calling Member Services.  Call the Behavioral Health Crisis Line at [phone], at any time, 24 hours a day, 7 days a week. If you are in danger or need immediate medical attention call 911.  If you would like help to quit smoking, call 1-800-QUIT-NOW (541-836-3235) OR Espaol: 1-855-Djelo-Ya (5-638-756-4332) o para ms informacin haga clic aqu or Text READY to 951-884 to register via text   Following is a copy of your plan of care:  Care Plan : LCSW Plan of Care  Updates made by [name], LCSW since 12/26/2022 12:00 AM     Problem: Alcohol Use (Wellness)      Goal: Alcohol Use Managed   Start Date: 12/16/2022  Note:   Goal: Addictive Behavior Managed    Start Date: 12/16/2022 Timeframe:  Short Range Goal Priority:  High                 Expected End Date: ongoing                  Follow Up Date- 12/30/22 at 930 am   - avoid negative self-talk - be honest with myself - begin personal  counseling - check out recovery housing - exercise at least 2 to 3 times per week - identify a recovery coach - identify triggers for relapse - join AA (Alcoholics Anonymous)  - learn how to handle negative feelings without drugs or alcohol - make a list of pleasurable things to do without using alcohol or drugs - make a plan to deal with triggers like holidays and anniversaries - make a plan to fix broken relationships - make a plan to handle bad days - make a written relapse plan - spend time or talk with a recovery coach or support person at least 2 or 3 times every week - spend time or talk with people who do not drink or use every day    Why is this important?               It is possible that after you stop drinking or using drugs that you may slip and use again. This is called a relapse.              A relapse does not mean that you have failed. It means you have had one or a few bad days.             Make sure you know why it is important for you to stay sober and remind yourself often.              Once  you figure out what triggered the relapse and take the steps to deal with it, you can be even more hopeful that recovery is possible.     Current barriers:               Mental Health needs related to Depression and Alcohol Abuse             Currently unable to independently self manage needs related to mental health conditions.             Need for inpatient treatment especially now that insurance change recently happened             Lacks knowledge of where and how to connect for mental health/substance abuse support   Clinical Goal(s): Over the next 90 days, patient will work with SW to reduce or manage symptoms of substance abuse and will assist with securing inpatient treatment.   How Much Alcohol is a Drink: Beer: 12 oz. = 1 drink 16 oz. = 1.3 drinks 22 oz. = 2 drinks 40 oz. = 3.3 drinks   Wine: 5 oz. = 1 drink 740 mL (25 oz.) bottle = 5 drinks  Malt Liquor: 12 oz.  = 1.5 drinks 16 oz. = 2 drinks 22 oz. = 2.5 drinks 40 oz. = 4.5 drinks   80-Proof Spirits - Hard Liquor: 1 shot = 1 drink 1 mixed drink = number of shots Can equal 1-3 drinks   What is Low-risk Drinking?            Have no more than 2 drinks of alcohol per day            Drink no more than 5 days per week            Do not drink alcohol drink alcohol when:               Identify Your Triggers for Drinking            Parties            Particular People            Feeling lonely            Feeling tense            Family problems            Feeling sad            Feeling happy             Feeling bored            After work            Problems sleeping            Criticism            Feelings of failure            After being paid            When others are drinking                 In bars            When out for dinner            After arguments            Weekends            Feeling restless            Being in pain  Effects of High-Risk Drinking To the Brain:            Aggressive, irrational behavior            Arguments, violence            Depression, nervousness            Alcohol dependence, memory loss To the Nervous System:            Trembling hands, tingling fingers            Numbness, painful nerves            Impaired sensation leading to falls            Numb tingling toes To Your Lifestyle:            Social, legal, medical problems            Domestic trouble/relationship loss            Job loss & financial problems            Shortened life span            Accidents and death from drunk driving               To the Face:            Premature aging, drinker's nose            Cancer of the throat & mouth To the Body:            Frequent cold            Reduced resistance to infection            Increased risk of pneumonia            Weakness of heart muscle            Heart failure, anemia            Impaired blood clotting             Breast cancer            Vitamin deficiency, bleeding            Severe Inflammation of the stomach            Vomiting, diarrhea, malnutrition            Ulcer, inflammation of the pancreas            Impaired sexual performance            Birth defects, including deformities, retardation, and low birthweight   Ways to Cope Without Drinking            Go home if you tend to drink after work            Find another activity            Switch to nonalcoholic beverages            Change friends            Join a Stage manager            Visit relatives            Plan/take a trip               Go for a walk  Take up a hobby            Listen to music            Talk to a friend            Reading            What would you do if you had no worries about failing?               Good Reasons for Drinking Less            I will live longer - probably 8-10 years.            I will sleep better.            I will be happier.            I will save a lot of money            My relationships will improve.            I will stay younger for longer.            I will achieve more in my life            There will be a greater chance that I will survive to a healthy old age with no premature damage to my brain.                         I will be better at my job.            I will be less likely to feel depressed and commit suicide (6 times less likely).            I will be less likely to die of heart disease or cancer.            Other people will respect me            I will be less likely to get into trouble with the police.            The possibility that I will die of liver disease will be dramatically reduced (12 times less likely).            It will be less likely that I will die in a car accident (3 times less likely).   Strategies for Cutting Down Keep Track.  Find a way to keep track of how much you drink.  If you make a note of each drink before you  drink it, this will help slow you down. Count and Measure.  Know the standard drink sizes.  Ask the bartender or server about the amount of alcohol in a mixed drink. Set Goals.  Decide how many days a week you will drink and how many drinks each day. Pace and Space.  When you do drink, pace yourself.  Have no more than one drink with alcohol per hour.  Alternate "drink spacers" non-alcoholic drinks such as water, soda, or juice with drinks containing alcohol. Include Food.  Don't drink on an empty stomach.  Have some food so the alcohol will be absorbed more slowly into your system.             Avoid Triggers.  Avoid people, places, or activities that have led to drinking in the past.  Certain times of day or feelings may also be triggers.  Make a plan so you will  know what you can do instead of drinking. Plan to Handle Urges.  When an urge hits, consider these options:  Remind yourself of your reasons for changing.  Or talk it through with someone you trust. Or get involved with a healthy, distracting activity.  Or, "urge surf" - instead of fighting the feeling, accept it and ride it out, knowing it will soon crest like a wave and pass. Know Your "No".  Have a polite, convincing "no thanks" for those times when you may be offered a drink and don't want one.  The faster you can say no to these offers, the less likely you are to give in.  If you hesitate, it allows you time to think of excuses to go along.        24- Hour Availability:      [facility]   [address] [place], [place] [phone] Crisis [phone]    [facility] [phone]   [place] [facility]  [phone]     [facility]  [phone] (after hours)    Therapeutic Alternative/Mobile Crisis   [phone]    [place] National Suicide Hotline  901-696-4724 [place]) [place] [address]    Call 911 or go to emergency room    [facility]  [phone]);  [place] and [employer]  [phone]); [place], [place], [place], [place], [place], [place], [place]     Initial Goal

## 2022-12-26 NOTE — Patient Outreach (Signed)
BSW received a vm from patient mom. BSW returned moms vm, she stated someone submitted patient information to marketplace and now patient has Engineer, maintenance and [facility]. Mom states no one accepts [facility] and patient will not be able to continue his inpatient rehab with it. Mom also states he will owe taxes.  Timothy Hubbard, MHA [facility]  Managed [facility] Social Worker [phone]

## 2022-12-26 NOTE — Patient Outreach (Signed)
Medicaid Managed Care Social Work Note  12/23/2022 Name:  Timothy Hubbard MRN:  [id number] DOB:  21-Jul-1991  Timothy Hubbard is an 32 y.o. year old male who is a primary patient of Timothy Dus, Hubbard.  The Medicaid Managed Care Coordination team was consulted for assistance with:  Mental Health Counseling and Resources  Timothy Hubbard was given information about Medicaid Managed Care Coordination team services today. Timothy Hubbard agreed to services and verbal consent obtained.  Engaged with patient  for by telephone forfollow up visit in response to referral for case management and/or care coordination services.   Assessments/Interventions:  Review of past medical history, allergies, medications, health status, including review of consultants reports, laboratory and other test data, was performed as part of comprehensive evaluation and provision of chronic care management services.  SDOH: (Social Determinant of Health) assessments and interventions performed: SDOH Interventions    Flowsheet Row Patient Outreach Telephone from 12/16/2022 in [place] POPULATION HEALTH DEPARTMENT Patient Outreach Telephone from 12/05/2022 in [place] POPULATION HEALTH DEPARTMENT Patient Outreach Telephone from 11/02/2022 in [place] POPULATION HEALTH DEPARTMENT  SDOH Interventions     Food Insecurity Interventions -- -- Intervention Not Indicated  Housing Interventions -- -- Intervention Not Indicated  Transportation Interventions -- -- Intervention Not Indicated  Utilities Interventions -- -- Intervention Not Indicated  Alcohol Usage Interventions -- Other (Comment)  [LCSW referral] --  Stress Interventions Offered [employer], Provide Counseling -- --       Advanced Directives Status:  See Care Plan for related entries.  Care Plan                 Allergies  Allergen Reactions   Ibuprofen Anaphylaxis    Medications Reviewed Today     Reviewed by Timothy Dach, RN  (Registered Nurse) on 12/05/22 at [phone]  Med List Status: <None>   Medication Order Taking? Sig Documenting Provider Last Dose Status Informant  acetaminophen (TYLENOL) 325 MG tablet [id number] Yes Take 2 tablets (650 mg total) by mouth every 6 (six) hours as needed for mild pain or moderate pain. Timothy Deeds, Hubbard Taking Active   albuterol (PROVENTIL) (2.5 MG/3ML) 0.083% nebulizer solution [id number] Yes USE (2.5 MG TOTAL) BY NEBULIZATION EVERY 6 (SIX) HOURS AS NEEDED FOR WHEEZING OR SHORTNESS OF BREATH. Timothy Dus, Hubbard Taking Active            Med Note (ROBB, MELANIE A   Mon Dec 05, 2022  9:16 AM)    amLODipine (NORVASC) 10 MG tablet [id number] Yes TAKE 1 TABLET (10 MG TOTAL) BY MOUTH DAILY (AM) Timothy Dus, Hubbard Taking Active   divalproex (DEPAKOTE) 500 MG DR tablet [id number] Yes TAKE ONE TABLET BY MOUTH TWICE DAILY (AM+BEDTIME) Timothy Dus, Hubbard Taking Active   ferrous sulfate (FEROSUL) 325 (65 FE) MG tablet [id number] Yes TAKE ONE TABLET BY MOUTH ONCE DAILY AT 12 NOON (NOON) Timothy Dus, Hubbard Taking Active   ferrous sulfate 324 (65 Fe) MG TBEC [id number] Yes Take 1 tablet (325 mg total) by mouth daily at 12 noon. Timothy Dus, Hubbard Taking Active   fluticasone [facility]) 50 MCG/ACT nasal spray [id number] Yes Place 2 sprays into both nostrils daily. Timothy Pho, Hubbard Taking Active   folic acid (FOLVITE) 1 MG tablet [id number] Yes TAKE 1 TABLET (1 MG TOTAL) BY MOUTH DAILY (AM) Timothy Dus, Hubbard Taking Active   hydrocortisone (ANUSOL-HC) 2.5 % rectal cream [id number] No Place 1 Application rectally 2 (two) times daily.  Patient not taking:  Reported on 12/05/2022   Timothy Deeds, Hubbard Not Taking Active   lisinopril (ZESTRIL) 5 MG tablet [id number] Yes TAKE 1 TABLET (5 MG TOTAL) BY MOUTH DAILY (AM) Timothy Dus, Hubbard Taking Active   magnesium oxide (MAG-OX) 400 (240 Mg) MG tablet [id number] No Take 1 tablet by mouth 2 (two) times daily.  Patient not taking: Reported on 11/02/2022   Provider,  Historical, Hubbard Not Taking Active   mometasone-formoterol [facility]) 100-5 MCG/ACT Sandrea Matte [id number] Yes INHALE TWO PUFFS INTO THE LUNGS TWICE A Timothy Hubbard Taking Active   Multiple Vitamin (MULTIVITAMIN WITH MINERALS) TABS tablet [id number] Yes Take 1 tablet by mouth daily. Pokhrel, Laxman, Hubbard Taking Active   naltrexone (DEPADE) 50 MG tablet [id number] Yes TAKE 1/2 TABLET (25 MG TOTAL) BY MOUTH DAILY (AM) Timothy Dus, Hubbard Taking Active   nicotine (NICODERM CQ - DOSED IN MG/24 HOURS) 14 mg/24hr patch [id number] No PLACE 1 PATCH (14 MG TOTAL) ONTO THE SKIN DAILY.  Patient not taking: Reported on 12/05/2022   Timothy Dus, Hubbard Not Taking Active   OLANZapine (ZYPREXA) 5 MG tablet [id number] Yes Take 1 tablet (5 mg total) by mouth at bedtime. Future refills will need to come from psychiatry. Timothy Dus, Hubbard Taking Active   pantoprazole (PROTONIX) 40 MG tablet [id number] Yes TAKE 1 TABLET (40 MG TOTAL) BY MOUTH DAILY (AM) Timothy Dus, Hubbard Taking Active   polyethylene glycol (MIRALAX / GLYCOLAX) 17 g packet [id number] Yes MIX AND DRINK BY MOUTH DAILY AS NEEDED FOR MODERATE CONSTIPATION Timothy Dus, Hubbard Taking Active   VENTOLIN HFA 108 (90 Base) MCG/ACT inhaler [id number] Yes Inhale into the lungs. Provider, Historical, Hubbard Taking Active             Patient Active Problem List   Diagnosis Date Noted   Tachycardia 09/10/2022   Post-nasal drip 07/15/2022   Cognitive impairment 07/15/2022   Impaired instrumental activities of daily living (IADL) 07/15/2022   Requires assistance with activities of daily living (ADL) 07/15/2022   Ambulatory dysfunction 04/06/2022   Mood disorder 03/31/2022   Grade III internal hemorrhoids    Adenomatous polyp of descending colon    Iron deficiency anemia 03/04/2022   Seizure    Alcohol use disorder 10/21/2016   Gastroesophageal reflux disease 06/13/2016   Essential hypertension 04/01/2016   Asthma 04/01/2016   TOBACCO USER 06/13/2009     Conditions to be addressed/monitored per PCP order:   Substance Use  Care Plan : LCSW Plan of Care  Updates made by Timothy Bryant, LCSW since 12/26/2022 12:00 AM     Problem: Alcohol Use (Wellness)      Goal: Alcohol Use Managed   Start Date: 12/16/2022  Note:   Goal: Addictive Behavior Managed    Start Date: 12/16/2022 Timeframe:  Short Range Goal Priority:  High                 Expected End Date: ongoing                  Follow Up Date- 12/30/22 at 930 am   - avoid negative self-talk - be honest with myself - begin personal counseling - check out recovery housing - exercise at least 2 to 3 times per week - identify a recovery coach - identify triggers for relapse - join AA (Alcoholics Anonymous)  - learn how to handle negative feelings without drugs or alcohol - make a list of pleasurable things to do without using alcohol or drugs - make a plan  to deal with triggers like holidays and anniversaries - make a plan to fix broken relationships - make a plan to handle bad days - make a written relapse plan - spend time or talk with a recovery coach or support person at least 2 or 3 times every week - spend time or talk with people who do not drink or use every day    Why is this important?               It is possible that after you stop drinking or using drugs that you may slip and use again. This is called a relapse.              A relapse does not mean that you have failed. It means you have had one or a few bad days.             Make sure you know why it is important for you to stay sober and remind yourself often.              Once you figure out what triggered the relapse and take the steps to deal with it, you can be even more hopeful that recovery is possible.     Current barriers:               Mental Health needs related to Depression and Alcohol Abuse             Currently unable to independently self manage needs related to mental health conditions.              Need for inpatient treatment especially now that insurance change recently happened             Lacks knowledge of where and how to connect for mental health/substance abuse support   Clinical Goal(s): Over the next 90 days, patient will work with SW to reduce or manage symptoms of substance abuse and will assist with securing inpatient treatment.    Clinical Interventions:              Assessed patient's previous treatment, needs, coping skills, current treatment, support system and barriers to care. Patient has never had any substance abuse treatment             Patient's mother was interviewed and appropriate assessments performed or reviewed: brief mental health assessment;Suicidal Ideation/Homicidal Ideation: No             Provided basic mental health support, education and interventions             Patient prefers for mother to be POC for [facility] LCSW. Patient is wanting to stop drinking alcohol but cannot find inpatient treatment somewhere that accepts his Medicaid, has a bed and is willing to take him as a patient. Patient will need to complete detox first before he can gain inpatient treatment. Patient can go to [facility] to detox and then can go to Inpatient Treatment at his preferred facility at [facility]. SECURE email sent to patient's mother.              Relapse prevention resource education provided.              Discussed several options for long term counseling based on need and insurance              [facility] LCSW educated patient on medication therapy that can help  with alcohol triggers.              Other interventions include: Motivational Interviewing             Solution-Focused Strategies             Mindfulness or Management consultant  Inter-disciplinary care team collaboration (see longitudinal plan of care)             [facility] LCSW educated patient on healthy coping skills to implement into her daily routine to  combat cravings and depressive symptoms once they arise. [facility] LCSW update- Patient's mother reports being very upset because she feels that someone from our team or through [facility] affected patient from gaining inpatient treatment. [facility] LCSW completed care coordination with Pharmacist, PCP and [facility] clinical supervisor. Patient's mother received a call from [facility] clinical supervisor and they reviewed concern as this caused a barrier in getting patient into treatment. [facility] LCSW will follow up within on 12/30/22 to continue assisting family with gaining substance abuse treatment and support.   Patient Goals/Self-Care Activities: Over the next 90 days             Implement interventions discussed today to decreases alcohol usage, symptoms of stress and increase knowledge and/or ability of: coping skills, healthy habits, self-management skills, and stress reduction.   Consider becoming involved in a structured program.  You should stop drinking if:            You have tried to cut down before but have not been successful, or                  You suffer from morning shakes during a heavy drinking period, or            You have high blood pressure, or            You are pregnant, or            You have liver disease            Your alcohol use is affecting your social relationships, or            You have legal consequences like DUIs, or            You call in sick to work, or            You cannot take care of our children, or            Someone close to you says you drink too much     How Much Alcohol is a Drink: Beer: 12 oz. = 1 drink 16 oz. = 1.3 drinks 22 oz. = 2 drinks 40 oz. = 3.3 drinks   Wine: 5 oz. = 1 drink 740 mL (25 oz.) bottle = 5 drinks  Malt Liquor: 12 oz. = 1.5 drinks 16 oz. = 2 drinks 22 oz. = 2.5 drinks 40 oz. = 4.5 drinks   80-Proof Spirits - Hard Liquor: 1 shot = 1 drink 1 mixed drink = number of shots Can equal 1-3 drinks   What is Low-risk Drinking?            Have no  more than 2 drinks of alcohol per day            Drink no more than 5 days per week            Do not drink alcohol drink alcohol when:  Identify Your Triggers for Drinking            Parties            Particular People            Feeling lonely            Feeling tense            Family problems            Feeling sad            Feeling happy             Feeling bored            After work            Problems sleeping            Criticism            Feelings of failure            After being paid            When others are drinking                 In bars            When out for dinner            After arguments            Weekends            Feeling restless            Being in pain   Effects of High-Risk Drinking To the Brain:            Aggressive, irrational behavior            Arguments, violence            Depression, nervousness            Alcohol dependence, memory loss To the Nervous System:            Trembling hands, tingling fingers            Numbness, painful nerves            Impaired sensation leading to falls            Numb tingling toes To Your Lifestyle:            Social, legal, medical problems            Domestic trouble/relationship loss            Job loss & financial problems            Shortened life span            Accidents and death from drunk driving               To the Face:            Premature aging, drinker's nose            Cancer of the throat & mouth To the Body:            Frequent cold            Reduced resistance to infection            Increased risk of pneumonia            Weakness of heart muscle  Heart failure, anemia            Impaired blood clotting            Breast cancer            Vitamin deficiency, bleeding            Severe Inflammation of the stomach            Vomiting, diarrhea, malnutrition            Ulcer, inflammation of the pancreas            Impaired sexual  performance            Birth defects, including deformities, retardation, and low birthweight   Ways to Cope Without Drinking            Go home if you tend to drink after work            Find another activity            Switch to nonalcoholic beverages            Change friends            Join a Stage manager            Visit relatives            Plan/take a trip               Go for a walk            Take up a hobby            Listen to music            Talk to a friend            Reading            What would you do if you had no worries about failing?               Good Reasons for Drinking Less            I will live longer - probably 8-10 years.            I will sleep better.            I will be happier.            I will save a lot of money            My relationships will improve.            I will stay younger for longer.            I will achieve more in my life            There will be a greater chance that I will survive to a healthy old age with no premature damage to my brain.                         I will be better at my job.            I will be less likely to feel depressed and commit suicide (6 times less likely).            I will be less likely to die of heart disease or cancer.  Other people will respect me            I will be less likely to get into trouble with the police.            The possibility that I will die of liver disease will be dramatically reduced (12 times less likely).            It will be less likely that I will die in a car accident (3 times less likely).   Strategies for Cutting Down Keep Track.  Find a way to keep track of how much you drink.  If you make a note of each drink before you drink it, this will help slow you down. Count and Measure.  Know the standard drink sizes.  Ask the bartender or server about the amount of alcohol in a mixed drink. Set Goals.  Decide how many days a week you will drink  and how many drinks each day. Pace and Space.  When you do drink, pace yourself.  Have no more than one drink with alcohol per hour.  Alternate "drink spacers" non-alcoholic drinks such as water, soda, or juice with drinks containing alcohol. Include Food.  Don't drink on an empty stomach.  Have some food so the alcohol will be absorbed more slowly into your system.             Avoid Triggers.  Avoid people, places, or activities that have led to drinking in the past.  Certain times of day or feelings may also be triggers.  Make a plan so you will know what you can do instead of drinking. Plan to Handle Urges.  When an urge hits, consider these options:  Remind yourself of your reasons for changing.  Or talk it through with someone you trust. Or get involved with a healthy, distracting activity.  Or, "urge surf" - instead of fighting the feeling, accept it and ride it out, knowing it will soon crest like a wave and pass. Know Your "No".  Have a polite, convincing "no thanks" for those times when you may be offered a drink and don't want one.  The faster you can say no to these offers, the less likely you are to give in.  If you hesitate, it allows you time to think of excuses to go along.        24- Hour Availability:      [facility]   [address] [place], [place] [phone] Crisis [phone]    [facility] [phone]   [facility]  [phone]     [facility]  [phone] (after hours)    Therapeutic Alternative/Mobile Crisis   [phone]    [place] National Suicide Hotline  [phone] Len Childs) [place] 606    Call 911 or go to emergency room    [facility]  [phone]);  [place] and [employer]  [phone]); [place], [place], [place], [place], [place], [place], [place]     Initial Goal      Follow up:  Patient agrees to Care  Plan and Follow-up.  Plan: The Managed Medicaid care management team will reach out to the patient again over the next 30 days.  Date/time of next scheduled Social Work care management/care coordination outreach:  12/30/22 at 930 am.  Dickie La, BSW, MSW, LCSW Managed Medicaid LCSW [facility]  [facility] [email or web address] Phone: [phone]

## 2022-12-27 ENCOUNTER — Telehealth: Payer: Self-pay | Admitting: Licensed Clinical Social Worker

## 2022-12-27 DIAGNOSIS — F17210 Nicotine dependence, cigarettes, uncomplicated: Secondary | ICD-10-CM | POA: Diagnosis not present

## 2022-12-27 DIAGNOSIS — F1310 Sedative, hypnotic or anxiolytic abuse, uncomplicated: Secondary | ICD-10-CM | POA: Diagnosis not present

## 2022-12-27 DIAGNOSIS — F10239 Alcohol dependence with withdrawal, unspecified: Secondary | ICD-10-CM | POA: Diagnosis not present

## 2022-12-27 DIAGNOSIS — F1220 Cannabis dependence, uncomplicated: Secondary | ICD-10-CM | POA: Diagnosis not present

## 2022-12-27 DIAGNOSIS — F1020 Alcohol dependence, uncomplicated: Secondary | ICD-10-CM | POA: Diagnosis not present

## 2022-12-27 DIAGNOSIS — R4189 Other symptoms and signs involving cognitive functions and awareness: Secondary | ICD-10-CM | POA: Diagnosis not present

## 2022-12-27 DIAGNOSIS — F319 Bipolar disorder, unspecified: Secondary | ICD-10-CM | POA: Diagnosis not present

## 2022-12-27 NOTE — Patient Outreach (Signed)
Medicaid Managed Care Social Work Note  12/27/2022 Name:  Timothy Hubbard MRN:  [id number] DOB:  02-04-1991  Timothy Hubbard is an 32 y.o. year old male who is a primary patient of Maury Dus, MD.  The Medicaid Managed Care Coordination team was consulted for assistance with:  Mental Health Counseling and Resources  Timothy Hubbard was given information about Medicaid Managed Care Coordination team services today. Timothy Hubbard Patient agreed to services and verbal consent obtained.  Engaged with patient  for by telephone forfollow up visit in response to referral for case management and/or care coordination services.   Assessments/Interventions:  Review of past medical history, allergies, medications, health status, including review of consultants reports, laboratory and other test data, was performed as part of comprehensive evaluation and provision of chronic care management services.  SDOH: (Social Determinant of Health) assessments and interventions performed: SDOH Interventions    Flowsheet Row Patient Outreach Telephone from 12/16/2022 in [place] POPULATION HEALTH DEPARTMENT Patient Outreach Telephone from 12/05/2022 in [place] POPULATION HEALTH DEPARTMENT Patient Outreach Telephone from 11/02/2022 in [place] POPULATION HEALTH DEPARTMENT  SDOH Interventions     Food Insecurity Interventions -- -- Intervention Not Indicated  Housing Interventions -- -- Intervention Not Indicated  Transportation Interventions -- -- Intervention Not Indicated  Utilities Interventions -- -- Intervention Not Indicated  Alcohol Usage Interventions -- Other (Comment)  [LCSW referral] --  Stress Interventions Offered [employer], Provide Counseling -- --       Advanced Directives Status:  See Care Plan for related entries.  Care Plan                 Allergies  Allergen Reactions   Ibuprofen Anaphylaxis    Medications Reviewed Today     Reviewed by [name], RN  (Registered Nurse) on 12/05/22 at [id number]  Med List Status: <None>   Medication Order Taking? Sig Documenting Provider Last Dose Status Informant  acetaminophen (TYLENOL) 325 MG tablet [id number] Yes Take 2 tablets (650 mg total) by mouth every 6 (six) hours as needed for mild pain or moderate pain. Littie Deeds, MD Taking Active   albuterol (PROVENTIL) (2.5 MG/3ML) 0.083% nebulizer solution [id number] Yes USE (2.5 MG TOTAL) BY NEBULIZATION EVERY 6 (SIX) HOURS AS NEEDED FOR WHEEZING OR SHORTNESS OF BREATH. Maury Dus, MD Taking Active            Med Note (ROBB, MELANIE A   Mon Dec 05, 2022  9:16 AM)    amLODipine (NORVASC) 10 MG tablet [id number] Yes TAKE 1 TABLET (10 MG TOTAL) BY MOUTH DAILY (AM) Maury Dus, MD Taking Active   divalproex (DEPAKOTE) 500 MG DR tablet [id number] Yes TAKE ONE TABLET BY MOUTH TWICE DAILY (AM+BEDTIME) Maury Dus, MD Taking Active   ferrous sulfate (FEROSUL) 325 (65 FE) MG tablet [id number] Yes TAKE ONE TABLET BY MOUTH ONCE DAILY AT 12 NOON (NOON) Maury Dus, MD Taking Active   ferrous sulfate 324 (65 Fe) MG TBEC [id number] Yes Take 1 tablet (325 mg total) by mouth daily at 12 noon. Maury Dus, MD Taking Active   fluticasone [facility]) 50 MCG/ACT nasal spray [id number] Yes Place 2 sprays into both nostrils daily. Fayette Pho, MD Taking Active   folic acid (FOLVITE) 1 MG tablet [id number] Yes TAKE 1 TABLET (1 MG TOTAL) BY MOUTH DAILY (AM) Maury Dus, MD Taking Active   hydrocortisone (ANUSOL-HC) 2.5 % rectal cream [id number] No Place 1 Application rectally 2 (two) times daily.  Patient not taking:  Reported on 12/05/2022   Littie Deeds, MD Not Taking Active   lisinopril (ZESTRIL) 5 MG tablet [id number] Yes TAKE 1 TABLET (5 MG TOTAL) BY MOUTH DAILY (AM) Maury Dus, MD Taking Active   magnesium oxide (MAG-OX) 400 (240 Mg) MG tablet [id number] No Take 1 tablet by mouth 2 (two) times daily.  Patient not taking: Reported on 11/02/2022   [provider] Not Taking Active   mometasone-formoterol [facility]) 100-5 MCG/ACT Sandrea Matte [id number] Yes INHALE TWO PUFFS INTO THE LUNGS TWICE A Annia Friendly, MD Taking Active   Multiple Vitamin (MULTIVITAMIN WITH MINERALS) TABS tablet [id number] Yes Take 1 tablet by mouth daily. Pokhrel, Laxman, MD Taking Active   naltrexone (DEPADE) 50 MG tablet [id number] Yes TAKE 1/2 TABLET (25 MG TOTAL) BY MOUTH DAILY (AM) Maury Dus, MD Taking Active   nicotine (NICODERM CQ - DOSED IN MG/24 HOURS) 14 mg/24hr patch [id number] No PLACE 1 PATCH (14 MG TOTAL) ONTO THE SKIN DAILY.  Patient not taking: Reported on 12/05/2022   Maury Dus, MD Not Taking Active   OLANZapine (ZYPREXA) 5 MG tablet [id number] Yes Take 1 tablet (5 mg total) by mouth at bedtime. Future refills will need to come from psychiatry. Maury Dus, MD Taking Active   pantoprazole (PROTONIX) 40 MG tablet [id number] Yes TAKE 1 TABLET (40 MG TOTAL) BY MOUTH DAILY (AM) Maury Dus, MD Taking Active   polyethylene glycol (MIRALAX / GLYCOLAX) 17 g packet [id number] Yes MIX AND DRINK BY MOUTH DAILY AS NEEDED FOR MODERATE CONSTIPATION Maury Dus, MD Taking Active   VENTOLIN HFA 108 (90 Base) MCG/ACT inhaler [id number] Yes Inhale into the lungs. [provider] Taking Active             Patient Active Problem List   Diagnosis Date Noted   Tachycardia 09/10/2022   Post-nasal drip 07/15/2022   Cognitive impairment 07/15/2022   Impaired instrumental activities of daily living (IADL) 07/15/2022   Requires assistance with activities of daily living (ADL) 07/15/2022   Ambulatory dysfunction 04/06/2022   Mood disorder 03/31/2022   Grade III internal hemorrhoids    Adenomatous polyp of descending colon    Iron deficiency anemia 03/04/2022   Seizure    Alcohol use disorder 10/21/2016   Gastroesophageal reflux disease 06/13/2016   Essential hypertension 04/01/2016   Asthma 04/01/2016   TOBACCO USER 06/13/2009   Care Plan :  LCSW Plan of Care  Updates made by [name], LCSW since 12/27/2022 12:00 AM     Problem: Alcohol Use (Wellness)      Goal: Alcohol Use Managed   Start Date: 12/16/2022  Note:   Goal: Addictive Behavior Managed    Start Date: 12/16/2022 Timeframe:  Short Range Goal Priority:  High                 Expected End Date: ongoing                  Follow Up Date- 12/30/22 at 930 am   - avoid negative self-talk - be honest with myself - begin personal counseling - check out recovery housing - exercise at least 2 to 3 times per week - identify a recovery coach - identify triggers for relapse - join AA (Alcoholics Anonymous)  - learn how to handle negative feelings without drugs or alcohol - make a list of pleasurable things to do without using alcohol or drugs - make a plan to deal with triggers like holidays and anniversaries - make a plan to  fix broken relationships - make a plan to handle bad days - make a written relapse plan - spend time or talk with a recovery coach or support person at least 2 or 3 times every week - spend time or talk with people who do not drink or use every day    Why is this important?               It is possible that after you stop drinking or using drugs that you may slip and use again. This is called a relapse.              A relapse does not mean that you have failed. It means you have had one or a few bad days.             Make sure you know why it is important for you to stay sober and remind yourself often.              Once you figure out what triggered the relapse and take the steps to deal with it, you can be even more hopeful that recovery is possible.     Current barriers:               Mental Health needs related to Depression and Alcohol Abuse             Currently unable to independently self manage needs related to mental health conditions.             Need for inpatient treatment especially now that insurance change recently  happened             Lacks knowledge of where and how to connect for mental health/substance abuse support   Clinical Goal(s): Over the next 90 days, patient will work with SW to reduce or manage symptoms of substance abuse and will assist with securing inpatient treatment.    Clinical Interventions:              Assessed patient's previous treatment, needs, coping skills, current treatment, support system and barriers to care. Patient has never had any substance abuse treatment             Patient's mother was interviewed and appropriate assessments performed or reviewed: brief mental health assessment;Suicidal Ideation/Homicidal Ideation: No             Provided basic mental health support, education and interventions             Patient prefers for mother to be POC for [facility] LCSW. Patient is wanting to stop drinking alcohol but cannot find inpatient treatment somewhere that accepts his Medicaid, has a bed and is willing to take him as a patient. Patient will need to complete detox first before he can gain inpatient treatment. Patient can go to [facility], [place] [facility] or [facility] to detox and then can go to Inpatient Treatment at his preferred facility at [facility]. SECURE email sent to patient's mother.              Relapse prevention resource education provided.              Discussed several options for long term counseling based on need and insurance              [facility] LCSW educated patient on medication therapy that can help with alcohol triggers.  Other interventions include: Motivational Interviewing             Solution-Focused Strategies             Mindfulness or Management consultant  Inter-disciplinary care team collaboration (see longitudinal plan of care)             [facility] LCSW educated patient on healthy coping skills to implement into her daily routine to combat cravings and depressive symptoms once they arise. [facility] LCSW update- Patient's  mother reports being very upset because she feels that someone through [facility] signed patient up for [facility] benefits which will affect patient from gaining inpatient treatment. [facility] LCSW completed care coordination with Pharmacist, PCP and [facility] clinical supervisor. Patient's mother received a call from [facility] clinical supervisor and they reviewed concern as this caused a barrier in getting patient into treatment. [facility] LCSW will follow up within on 12/30/22 to continue assisting family with gaining substance abuse treatment and support. 12/27/22 update- Ongoing care coordination completed with entire [facility] team on 12/26/22 and 12/27/22. Patient was signed up through [employer] for [employer] somehow and wishes to terminate plan. Patient's mother spent a lot of time on trying to terminate this health care plan yesterday and even though she is the POA, she was unable to terminate his plan as patient is currently in detox treatment and unable to contact plan himself. Patient will need inpatient SA treatment post detox discharge but it will be difficult to find a treatment facility that would accept An[place] for insurance coverage and mother is aware that of this concern.    Patient Goals/Self-Care Activities: Over the next 90 days             Implement interventions discussed today to decreases alcohol usage, symptoms of stress and increase knowledge and/or ability of: coping skills, healthy habits, self-management skills, and stress reduction.   Consider becoming involved in a structured program.  You should stop drinking if:            You have tried to cut down before but have not been successful, or                  You suffer from morning shakes during a heavy drinking period, or            You have high blood pressure, or            You are pregnant, or            You have liver disease            Your alcohol use is affecting your social relationships, or            You have legal consequences like  DUIs, or            You call in sick to work, or            You cannot take care of our children, or            Someone close to you says you drink too much     How Much Alcohol is a Drink: Beer: 12 oz. = 1 drink 16 oz. = 1.3 drinks 22 oz. = 2 drinks 40 oz. = 3.3 drinks   Wine: 5 oz. = 1 drink 740 mL (25 oz.) bottle = 5 drinks  Malt Liquor: 12 oz. = 1.5 drinks 16 oz. = 2 drinks 22 oz. = 2.5 drinks 40 oz. =  4.5 drinks   80-Proof Spirits - Hard Liquor: 1 shot = 1 drink 1 mixed drink = number of shots Can equal 1-3 drinks   What is Low-risk Drinking?            Have no more than 2 drinks of alcohol per day            Drink no more than 5 days per week            Do not drink alcohol drink alcohol when:               Identify Your Triggers for Drinking            Parties            Particular People            Feeling lonely            Feeling tense            Family problems            Feeling sad            Feeling happy             Feeling bored            After work            Problems sleeping            Criticism            Feelings of failure            After being paid            When others are drinking                 In bars            When out for dinner            After arguments            Weekends            Feeling restless            Being in pain   Effects of High-Risk Drinking To the Brain:            Aggressive, irrational behavior            Arguments, violence            Depression, nervousness            Alcohol dependence, memory loss To the Nervous System:            Trembling hands, tingling fingers            Numbness, painful nerves            Impaired sensation leading to falls            Numb tingling toes To Your Lifestyle:            Social, legal, medical problems            Domestic trouble/relationship loss            Job loss & financial problems            Shortened life span            Accidents and death from drunk  driving               To  the Face:            Premature aging, drinker's nose            Cancer of the throat & mouth To the Body:            Frequent cold            Reduced resistance to infection            Increased risk of pneumonia            Weakness of heart muscle            Heart failure, anemia            Impaired blood clotting            Breast cancer            Vitamin deficiency, bleeding            Severe Inflammation of the stomach            Vomiting, diarrhea, malnutrition            Ulcer, inflammation of the pancreas            Impaired sexual performance            Birth defects, including deformities, retardation, and low birthweight   Ways to Cope Without Drinking            Go home if you tend to drink after work            Find another activity            Switch to nonalcoholic beverages            Change friends            Join a Stage manager            Visit relatives            Plan/take a trip               Go for a walk            Take up a hobby            Listen to music            Talk to a friend            Reading            What would you do if you had no worries about failing?               Good Reasons for Drinking Less            I will live longer - probably 8-10 years.            I will sleep better.            I will be happier.            I will save a lot of money            My relationships will improve.            I will stay younger for longer.            I will achieve more in my life            There will be a greater chance  that I will survive to a healthy old age with no premature damage to my brain.                         I will be better at my job.            I will be less likely to feel depressed and commit suicide (6 times less likely).            I will be less likely to die of heart disease or cancer.            Other people will respect me            I will be less likely to get into  trouble with the police.            The possibility that I will die of liver disease will be dramatically reduced (12 times less likely).            It will be less likely that I will die in a car accident (3 times less likely).   Strategies for Cutting Down Keep Track.  Find a way to keep track of how much you drink.  If you make a note of each drink before you drink it, this will help slow you down. Count and Measure.  Know the standard drink sizes.  Ask the bartender or server about the amount of alcohol in a mixed drink. Set Goals.  Decide how many days a week you will drink and how many drinks each day. Pace and Space.  When you do drink, pace yourself.  Have no more than one drink with alcohol per hour.  Alternate "drink spacers" non-alcoholic drinks such as water, soda, or juice with drinks containing alcohol. Include Food.  Don't drink on an empty stomach.  Have some food so the alcohol will be absorbed more slowly into your system.             Avoid Triggers.  Avoid people, places, or activities that have led to drinking in the past.  Certain times of day or feelings may also be triggers.  Make a plan so you will know what you can do instead of drinking. Plan to Handle Urges.  When an urge hits, consider these options:  Remind yourself of your reasons for changing.  Or talk it through with someone you trust. Or get involved with a healthy, distracting activity.  Or, "urge surf" - instead of fighting the feeling, accept it and ride it out, knowing it will soon crest like a wave and pass. Know Your "No".  Have a polite, convincing "no thanks" for those times when you may be offered a drink and don't want one.  The faster you can say no to these offers, the less likely you are to give in.  If you hesitate, it allows you time to think of excuses to go along.        24- Hour Availability:      [facility]   [address] [place], [place]  [phone] Crisis [phone]    [facility] [phone]   [facility]  [phone]     [facility]  [phone] (after hours)    Therapeutic Alternative/Mobile Crisis   [phone]    [place] National Suicide Hotline  [phone] (TALK) OR 988    Call 911 or go  to emergency room    [facility]  [phone]);  [place] and [employer]  [phone]); [place], [place], [place], [place], [place], [place], [place]   Follow up goal      Follow up:  Patient agrees to Care Plan and Follow-up.  Plan: The Managed Medicaid care management team will reach out to the patient again over the next 30 days.  Date of next scheduled Social Work care management/care coordination outreach:  12/30/22  Dickie La, BSW, MSW, LCSW Managed Medicaid LCSW [facility]  [facility] [email or web address] Phone: [phone]

## 2022-12-30 ENCOUNTER — Other Ambulatory Visit (HOSPITAL_COMMUNITY): Payer: Self-pay

## 2022-12-30 ENCOUNTER — Other Ambulatory Visit: Payer: 59 | Admitting: Licensed Clinical Social Worker

## 2022-12-30 DIAGNOSIS — R4189 Other symptoms and signs involving cognitive functions and awareness: Secondary | ICD-10-CM | POA: Diagnosis not present

## 2022-12-30 NOTE — Patient Instructions (Signed)
Visit Information  [name] was given information about Medicaid Managed Care team care coordination services as a part of their [facility] Community Plan Medicaid benefit. [name] verbally consented to engagement with the [facility] Managed Care team.   If you are experiencing a medical emergency, please call 911 or report to your local emergency department or urgent care.   If you have a non-emergency medical problem during routine business hours, please contact your provider's office and ask to speak with a nurse.   For questions related to your [facility], please call: [phone] or visit the homepage here: [email or web address]  If you would like to schedule transportation through your [facility], please call the following number at least 2 days in advance of your appointment: [phone]   Rides for urgent appointments can also be made after hours by calling Member Services.  Call the Behavioral Health Crisis Line at [phone], at any time, 24 hours a day, 7 days a week. If you are in danger or need immediate medical attention call 911.  If you would like help to quit smoking, call 1-800-QUIT-NOW (306-581-6175) OR Espaol: 1-855-Djelo-Ya (7-253-664-4034) o para ms informacin haga clic aqu or Text READY to 742-595 to register via text   Following is a copy of your plan of care:  Care Plan : LCSW Plan of Care  Updates made by [name], LCSW since 12/30/2022 12:00 AM     Problem: Alcohol Use (Wellness)      Goal: Alcohol Use Managed   Start Date: 12/16/2022  Note:   Goal: Addictive Behavior Managed    Start Date: 12/16/2022 Timeframe:  Short Range Goal Priority:  High                 Expected End Date: ongoing                  Follow Up Date-01/02/23 at 915 am   - avoid negative self-talk - be honest with myself - begin personal counseling -  check out recovery housing - exercise at least 2 to 3 times per week - identify a recovery coach - identify triggers for relapse - join AA (Alcoholics Anonymous)  - learn how to handle negative feelings without drugs or alcohol - make a list of pleasurable things to do without using alcohol or drugs - make a plan to deal with triggers like holidays and anniversaries - make a plan to fix broken relationships - make a plan to handle bad days - make a written relapse plan - spend time or talk with a recovery coach or support person at least 2 or 3 times every week - spend time or talk with people who do not drink or use every day    Why is this important?               It is possible that after you stop drinking or using drugs that you may slip and use again. This is called a relapse.              A relapse does not mean that you have failed. It means you have had one or a few bad days.             Make sure you know why it is important for you to stay sober and remind yourself often.              Once you figure  out what triggered the relapse and take the steps to deal with it, you can be even more hopeful that recovery is possible.     Current barriers:               Mental Health needs related to Depression and Alcohol Abuse             Currently unable to independently self manage needs related to mental health conditions.             Need for inpatient treatment especially now that insurance change recently happened             Lacks knowledge of where and how to connect for mental health/substance abuse support   Clinical Goal(s): Over the next 90 days, patient will work with SW to reduce or manage symptoms of substance abuse and will assist with securing inpatient treatment.     Patient Goals/Self-Care Activities: Over the next 90 days             Implement interventions discussed today to decreases alcohol usage, symptoms of stress and increase knowledge and/or ability of: coping  skills, healthy habits, self-management skills, and stress reduction.   Consider becoming involved in a structured program.  You should stop drinking if:            You have tried to cut down before but have not been successful, or                  You suffer from morning shakes during a heavy drinking period, or            You have high blood pressure, or            You are pregnant, or            You have liver disease            Your alcohol use is affecting your social relationships, or            You have legal consequences like DUIs, or            You call in sick to work, or            You cannot take care of our children, or            Someone close to you says you drink too much     How Much Alcohol is a Drink: Beer: 12 oz. = 1 drink 16 oz. = 1.3 drinks 22 oz. = 2 drinks 40 oz. = 3.3 drinks   Wine: 5 oz. = 1 drink 740 mL (25 oz.) bottle = 5 drinks  Malt Liquor: 12 oz. = 1.5 drinks 16 oz. = 2 drinks 22 oz. = 2.5 drinks 40 oz. = 4.5 drinks   80-Proof Spirits - Hard Liquor: 1 shot = 1 drink 1 mixed drink = number of shots Can equal 1-3 drinks   What is Low-risk Drinking?            Have no more than 2 drinks of alcohol per day            Drink no more than 5 days per week            Do not drink alcohol drink alcohol when:               Identify Your Triggers for Drinking  Parties            Particular People            Feeling lonely            Feeling tense            Family problems            Feeling sad            Feeling happy             Feeling bored            After work            Problems sleeping            Criticism            Feelings of failure            After being paid            When others are drinking                 In bars            When out for dinner            After arguments            Weekends            Feeling restless            Being in pain   Effects of High-Risk Drinking To the Brain:             Aggressive, irrational behavior            Arguments, violence            Depression, nervousness            Alcohol dependence, memory loss To the Nervous System:            Trembling hands, tingling fingers            Numbness, painful nerves            Impaired sensation leading to falls            Numb tingling toes To Your Lifestyle:            Social, legal, medical problems            Domestic trouble/relationship loss            Job loss & financial problems            Shortened life span            Accidents and death from drunk driving               To the Face:            Premature aging, drinker's nose            Cancer of the throat & mouth To the Body:            Frequent cold            Reduced resistance to infection            Increased risk of pneumonia            Weakness of heart muscle            Heart failure, anemia  Impaired blood clotting            Breast cancer            Vitamin deficiency, bleeding            Severe Inflammation of the stomach            Vomiting, diarrhea, malnutrition            Ulcer, inflammation of the pancreas            Impaired sexual performance            Birth defects, including deformities, retardation, and low birthweight   Ways to Cope Without Drinking            Go home if you tend to drink after work            Find another activity            Switch to nonalcoholic beverages            Change friends            Join a Stage manager            Visit relatives            Plan/take a trip               Go for a walk            Take up a hobby            Listen to music            Talk to a friend            Reading            What would you do if you had no worries about failing?               Good Reasons for Drinking Less            I will live longer - probably 8-10 years.            I will sleep better.            I will be happier.            I will save a lot of money             My relationships will improve.            I will stay younger for longer.            I will achieve more in my life            There will be a greater chance that I will survive to a healthy old age with no premature damage to my brain.                         I will be better at my job.            I will be less likely to feel depressed and commit suicide (6 times less likely).            I will be less likely to die of heart disease or cancer.            Other people will respect me  I will be less likely to get into trouble with the police.            The possibility that I will die of liver disease will be dramatically reduced (12 times less likely).            It will be less likely that I will die in a car accident (3 times less likely).   Strategies for Cutting Down Keep Track.  Find a way to keep track of how much you drink.  If you make a note of each drink before you drink it, this will help slow you down. Count and Measure.  Know the standard drink sizes.  Ask the bartender or server about the amount of alcohol in a mixed drink. Set Goals.  Decide how many days a week you will drink and how many drinks each day. Pace and Space.  When you do drink, pace yourself.  Have no more than one drink with alcohol per hour.  Alternate "drink spacers" non-alcoholic drinks such as water, soda, or juice with drinks containing alcohol. Include Food.  Don't drink on an empty stomach.  Have some food so the alcohol will be absorbed more slowly into your system.             Avoid Triggers.  Avoid people, places, or activities that have led to drinking in the past.  Certain times of day or feelings may also be triggers.  Make a plan so you will know what you can do instead of drinking. Plan to Handle Urges.  When an urge hits, consider these options:  Remind yourself of your reasons for changing.  Or talk it through with someone you trust. Or get involved with a healthy, distracting  activity.  Or, "urge surf" - instead of fighting the feeling, accept it and ride it out, knowing it will soon crest like a wave and pass. Know Your "No".  Have a polite, convincing "no thanks" for those times when you may be offered a drink and don't want one.  The faster you can say no to these offers, the less likely you are to give in.  If you hesitate, it allows you time to think of excuses to go along.        24- Hour Availability:      [facility]   [address] [place], [place] [phone] Crisis [phone]    [facility] [phone]   [place] [facility]  [phone]     [facility]  [phone] (after hours)    Therapeutic Alternative/Mobile Crisis   [phone]    [place] National Suicide Hotline  (802)403-1182 Len Childs) [place] 025    Call 911 or go to emergency room    [facility]  [phone]);  [place] and [employer]  [phone]); [place], [place], [place], [place], [place], [place], [place]   Follow up goal

## 2022-12-30 NOTE — Patient Outreach (Addendum)
Medicaid Managed Care Social Work Note  12/30/2022 Name:  Timothy Hubbard MRN:  [id number] DOB:  12/26/1990  Timothy Hubbard is an 32 y.o. year old male who is a primary patient of Timothy Dus, MD.  The Medicaid Managed Care Coordination team was consulted for assistance with:  Mental Health Counseling and Resources  [name] was given information about Medicaid Managed Care Coordination team services today. Timothy Hubbard agreed to services and verbal consent obtained. Patient is currently in detox at [facility].   Engaged with patient  for by telephone forfollow up visit in response to referral for case management and/or care coordination services.   Assessments/Interventions:  Review of past medical history, allergies, medications, health status, including review of consultants reports, laboratory and other test data, was performed as part of comprehensive evaluation and provision of chronic care management services.  SDOH: (Social Determinant of Health) assessments and interventions performed: SDOH Interventions    Flowsheet Row Patient Outreach Telephone from 12/30/2022 in [place] POPULATION HEALTH DEPARTMENT Patient Outreach Telephone from 12/16/2022 in [place] POPULATION HEALTH DEPARTMENT Patient Outreach Telephone from 12/05/2022 in [place] POPULATION HEALTH DEPARTMENT Patient Outreach Telephone from 11/02/2022 in [place] POPULATION HEALTH DEPARTMENT  SDOH Interventions      Food Insecurity Interventions -- -- -- Intervention Not Indicated  Housing Interventions -- -- -- Intervention Not Indicated  Transportation Interventions -- -- -- Intervention Not Indicated  Utilities Interventions -- -- -- Intervention Not Indicated  Alcohol Usage Interventions -- -- Other (Comment)  [LCSW referral] --  Stress Interventions Provide Counseling, Offered Timothy Hubbard Resources Offered Timothy Hubbard, Provide Counseling -- --       Advanced  Directives Status:  See Care Plan for related entries.  Care Plan                 Allergies  Allergen Reactions   Ibuprofen Anaphylaxis    Medications Reviewed Today     Reviewed by Timothy Dach, RN (Registered Nurse) on 12/05/22 at [phone]  Med List Status: <None>   Medication Order Taking? Sig Documenting Provider Last Dose Status Informant  acetaminophen (TYLENOL) 325 MG tablet [id number] Yes Take 2 tablets (650 mg total) by mouth every 6 (six) hours as needed for mild pain or moderate pain. Timothy Deeds, MD Taking Active   albuterol (PROVENTIL) (2.5 MG/3ML) 0.083% nebulizer solution [id number] Yes USE (2.5 MG TOTAL) BY NEBULIZATION EVERY 6 (SIX) HOURS AS NEEDED FOR WHEEZING OR SHORTNESS OF BREATH. Timothy Dus, MD Taking Active            Med Note (ROBB, MELANIE A   Mon Dec 05, 2022  9:16 AM)    amLODipine (NORVASC) 10 MG tablet [id number] Yes TAKE 1 TABLET (10 MG TOTAL) BY MOUTH DAILY (AM) Timothy Dus, MD Taking Active   divalproex (DEPAKOTE) 500 MG DR tablet [id number] Yes TAKE ONE TABLET BY MOUTH TWICE DAILY (AM+BEDTIME) Timothy Dus, MD Taking Active   ferrous sulfate (FEROSUL) 325 (65 FE) MG tablet [id number] Yes TAKE ONE TABLET BY MOUTH ONCE DAILY AT 12 NOON (NOON) Timothy Dus, MD Taking Active   ferrous sulfate 324 (65 Fe) MG TBEC [id number] Yes Take 1 tablet (325 mg total) by mouth daily at 12 noon. Timothy Dus, MD Taking Active   fluticasone [facility]) 50 MCG/ACT nasal spray [id number] Yes Place 2 sprays into both nostrils daily. Fayette Pho, MD Taking Active   folic acid (FOLVITE) 1 MG tablet [id number] Yes TAKE 1 TABLET (1  MG TOTAL) BY MOUTH DAILY (AM) Timothy Dus, MD Taking Active   hydrocortisone (ANUSOL-HC) 2.5 % rectal cream [id number] No Place 1 Application rectally 2 (two) times daily.  Patient not taking: Reported on 12/05/2022   Timothy Deeds, MD Not Taking Active   lisinopril (ZESTRIL) 5 MG tablet [id number] Yes TAKE 1 TABLET (5 MG TOTAL) BY MOUTH DAILY  (AM) Timothy Dus, MD Taking Active   magnesium oxide (MAG-OX) 400 (240 Mg) MG tablet [id number] No Take 1 tablet by mouth 2 (two) times daily.  Patient not taking: Reported on 11/02/2022   [provider] Not Taking Active   mometasone-formoterol [facility]) 100-5 MCG/ACT Sandrea Matte [id number] Yes INHALE TWO PUFFS INTO THE LUNGS TWICE A Annia Friendly, MD Taking Active   Multiple Vitamin (MULTIVITAMIN WITH MINERALS) TABS tablet [id number] Yes Take 1 tablet by mouth daily. Pokhrel, Laxman, MD Taking Active   naltrexone (DEPADE) 50 MG tablet [id number] Yes TAKE 1/2 TABLET (25 MG TOTAL) BY MOUTH DAILY (AM) Timothy Dus, MD Taking Active   nicotine (NICODERM CQ - DOSED IN MG/24 HOURS) 14 mg/24hr patch [id number] No PLACE 1 PATCH (14 MG TOTAL) ONTO THE SKIN DAILY.  Patient not taking: Reported on 12/05/2022   Timothy Dus, MD Not Taking Active   OLANZapine (ZYPREXA) 5 MG tablet [id number] Yes Take 1 tablet (5 mg total) by mouth at bedtime. Future refills will need to come from psychiatry. Timothy Dus, MD Taking Active   pantoprazole (PROTONIX) 40 MG tablet [id number] Yes TAKE 1 TABLET (40 MG TOTAL) BY MOUTH DAILY (AM) Timothy Dus, MD Taking Active   polyethylene glycol (MIRALAX / GLYCOLAX) 17 g packet [id number] Yes MIX AND DRINK BY MOUTH DAILY AS NEEDED FOR MODERATE CONSTIPATION Timothy Dus, MD Taking Active   VENTOLIN HFA 108 (90 Base) MCG/ACT inhaler [id number] Yes Inhale into the lungs. [provider] Taking Active             Patient Active Problem List   Diagnosis Date Noted   Tachycardia 09/10/2022   Post-nasal drip 07/15/2022   Cognitive impairment 07/15/2022   Impaired instrumental activities of daily living (IADL) 07/15/2022   Requires assistance with activities of daily living (ADL) 07/15/2022   Ambulatory dysfunction 04/06/2022   Mood disorder (HCC) 03/31/2022   Grade III internal hemorrhoids    Adenomatous polyp of descending colon    Iron deficiency  anemia 03/04/2022   Seizure (HCC)    Alcohol use disorder 10/21/2016   Gastroesophageal reflux disease 06/13/2016   Essential hypertension 04/01/2016   Asthma 04/01/2016   TOBACCO USER 06/13/2009    Conditions to be addressed/monitored per PCP order:   Alcohol Use  Care Plan : LCSW Plan of Care  Updates made by [name], LCSW since 12/30/2022 12:00 AM     Problem: Alcohol Use (Wellness)      Goal: Alcohol Use Managed   Start Date: 12/16/2022  Note:   Goal: Addictive Behavior Managed    Start Date: 12/16/2022 Timeframe:  Short Range Goal Priority:  High                 Expected End Date: ongoing                  Follow Up Date-01/02/23 at 915 am   - avoid negative self-talk - be honest with myself - begin personal counseling - check out recovery housing - exercise at least 2 to 3 times per week - identify a recovery coach - identify triggers for relapse -  join AA (Alcoholics Anonymous)  - learn how to handle negative feelings without drugs or alcohol - make a list of pleasurable things to do without using alcohol or drugs - make a plan to deal with triggers like holidays and anniversaries - make a plan to fix broken relationships - make a plan to handle bad days - make a written relapse plan - spend time or talk with a recovery coach or support person at least 2 or 3 times every week - spend time or talk with people who do not drink or use every day    Why is this important?               It is possible that after you stop drinking or using drugs that you may slip and use again. This is called a relapse.              A relapse does not mean that you have failed. It means you have had one or a few bad days.             Make sure you know why it is important for you to stay sober and remind yourself often.              Once you figure out what triggered the relapse and take the steps to deal with it, you can be even more hopeful that recovery is possible.      Current barriers:               Mental Health needs related to Depression and Alcohol Abuse             Currently unable to independently self manage needs related to mental health conditions.             Need for inpatient treatment especially now that insurance change recently happened             Lacks knowledge of where and how to connect for mental health/substance abuse support   Clinical Goal(s): Over the next 90 days, patient will work with SW to reduce or manage symptoms of substance abuse and will assist with securing inpatient treatment.    Clinical Interventions:              Assessed patient's previous treatment, needs, coping skills, current treatment, support system and barriers to care. Patient has never had any substance abuse treatment             Patient's mother was interviewed and appropriate assessments performed or reviewed: brief mental health assessment;Suicidal Ideation/Homicidal Ideation: No             Provided basic mental health support, education and interventions             Patient prefers for mother to be POC for [facility] LCSW. Patient is wanting to stop drinking alcohol but cannot find inpatient treatment somewhere that accepts his Medicaid, has a bed and is willing to take him as a patient. Patient will need to complete detox first before he can gain inpatient treatment. Patient can go to [facility], [place] [facility] or [facility] to detox and then can go to Inpatient Treatment at his preferred facility at [facility]. SECURE email sent to patient's mother.              Relapse prevention resource education provided.              Discussed  several options for long term counseling based on need and insurance              [facility] LCSW educated patient on medication therapy that can help with alcohol triggers.              Other interventions include: Motivational Interviewing             Solution-Focused Strategies             Mindfulness  or Management consultant  Inter-disciplinary care team collaboration (see longitudinal plan of care)             [facility] LCSW educated patient on healthy coping skills to implement into her daily routine to combat cravings and depressive symptoms once they arise. [facility] LCSW update- Patient's mother reports being very upset because she feels that someone through [facility] signed patient up for [facility] benefits which will affect patient from gaining inpatient treatment. [facility] LCSW completed care coordination with Pharmacist, PCP and [facility] clinical supervisor. Patient's mother received a call from [facility] clinical supervisor and they reviewed concern as this caused a barrier in getting patient into treatment. [facility] LCSW will follow up within on 12/30/22 to continue assisting family with gaining substance abuse treatment and support. 12/27/22 update- Ongoing care coordination completed with entire [facility] team on 12/26/22 and 12/27/22. Patient was signed up through [employer] for [employer] somehow and wishes to terminate plan. Patient's mother spent a lot of time on trying to terminate this health care plan yesterday and even though she is the POA, she was unable to terminate his plan as patient is currently in detox treatment and unable to contact plan himself. Patient will need inpatient SA treatment post detox discharge but it will be difficult to find a treatment facility that would accept [place] for insurance coverage and mother is aware that of this concern. [facility] LCSW 12/30/22 update- Patient's mother reports that son will discharge from [facility] for detox today. Social worker in the ED sent referral to [facility] [employer] [facility] but they declined referral as although patient has Medicaid, he also has [facility]ia [employer]ouch which is a Media planner and takes priority over [employer]. They do not accept [employer]. [facility] LCSW and patient's mother completed call to [facility] and spoke with admissions. Admissions department was  informed of situation. Mother admits that she got the names of Kizzie Fantasia and [facility] mixed up but that she did call them this Tuesday and successfully canceled their services. However, admissions looked up patient's insurance information and it is still showing active and will not change until the 1st of next month. [facility] LCSW sent patient's mother resources that Admissions coordinator sent [facility] LCSW. Patient's mother was advised to get proof of cancellation of services from [place] or to ask them to fax over this documentation. Contact information and resources were sent to mother's email. Mother physically wrote down important numbers as well including [facility] fax number.    Patient Goals/Self-Care Activities: Over the next 90 days             Implement interventions discussed today to decreases alcohol usage, symptoms of stress and increase knowledge and/or ability of: coping skills, healthy habits, self-management skills, and stress reduction.   Consider becoming involved in a structured program.  You should stop drinking if:            You have tried to cut down before but have not been successful, or  You suffer from morning shakes during a heavy drinking period, or            You have high blood pressure, or            You are pregnant, or            You have liver disease            Your alcohol use is affecting your social relationships, or            You have legal consequences like DUIs, or            You call in sick to work, or            You cannot take care of our children, or            Someone close to you says you drink too much     How Much Alcohol is a Drink: Beer: 12 oz. = 1 drink 16 oz. = 1.3 drinks 22 oz. = 2 drinks 40 oz. = 3.3 drinks   Wine: 5 oz. = 1 drink 740 mL (25 oz.) bottle = 5 drinks  Malt Liquor: 12 oz. = 1.5 drinks 16 oz. = 2 drinks 22 oz. = 2.5 drinks 40 oz. = 4.5 drinks   80-Proof Spirits - Hard Liquor: 1 shot = 1 drink 1 mixed drink =  number of shots Can equal 1-3 drinks   What is Low-risk Drinking?            Have no more than 2 drinks of alcohol per day            Drink no more than 5 days per week            Do not drink alcohol drink alcohol when:               Identify Your Triggers for Drinking            Parties            Particular People            Feeling lonely            Feeling tense            Family problems            Feeling sad            Feeling happy             Feeling bored            After work            Problems sleeping            Criticism            Feelings of failure            After being paid            When others are drinking                 In bars            When out for dinner            After arguments            Weekends            Feeling restless            Being in pain  Effects of High-Risk Drinking To the Brain:            Aggressive, irrational behavior            Arguments, violence            Depression, nervousness            Alcohol dependence, memory loss To the Nervous System:            Trembling hands, tingling fingers            Numbness, painful nerves            Impaired sensation leading to falls            Numb tingling toes To Your Lifestyle:            Social, legal, medical problems            Domestic trouble/relationship loss            Job loss & financial problems            Shortened life span            Accidents and death from drunk driving               To the Face:            Premature aging, drinker's nose            Cancer of the throat & mouth To the Body:            Frequent cold            Reduced resistance to infection            Increased risk of pneumonia            Weakness of heart muscle            Heart failure, anemia            Impaired blood clotting            Breast cancer            Vitamin deficiency, bleeding            Severe Inflammation of the stomach            Vomiting, diarrhea,  malnutrition            Ulcer, inflammation of the pancreas            Impaired sexual performance            Birth defects, including deformities, retardation, and low birthweight   Ways to Cope Without Drinking            Go home if you tend to drink after work            Find another activity            Switch to nonalcoholic beverages            Change friends            Join a Stage manager            Visit relatives            Plan/take a trip               Go for a walk  Take up a hobby            Listen to music            Talk to a friend            Reading            What would you do if you had no worries about failing?               Good Reasons for Drinking Less            I will live longer - probably 8-10 years.            I will sleep better.            I will be happier.            I will save a lot of money            My relationships will improve.            I will stay younger for longer.            I will achieve more in my life            There will be a greater chance that I will survive to a healthy old age with no premature damage to my brain.                         I will be better at my job.            I will be less likely to feel depressed and commit suicide (6 times less likely).            I will be less likely to die of heart disease or cancer.            Other people will respect me            I will be less likely to get into trouble with the police.            The possibility that I will die of liver disease will be dramatically reduced (12 times less likely).            It will be less likely that I will die in a car accident (3 times less likely).   Strategies for Cutting Down Keep Track.  Find a way to keep track of how much you drink.  If you make a note of each drink before you drink it, this will help slow you down. Count and Measure.  Know the standard drink sizes.  Ask the bartender or server about the  amount of alcohol in a mixed drink. Set Goals.  Decide how many days a week you will drink and how many drinks each day. Pace and Space.  When you do drink, pace yourself.  Have no more than one drink with alcohol per hour.  Alternate "drink spacers" non-alcoholic drinks such as water, soda, or juice with drinks containing alcohol. Include Food.  Don't drink on an empty stomach.  Have some food so the alcohol will be absorbed more slowly into your system.             Avoid Triggers.  Avoid people, places, or activities that have led to drinking in the past.  Certain times of day or feelings may also be triggers.  Make a plan so you will  know what you can do instead of drinking. Plan to Handle Urges.  When an urge hits, consider these options:  Remind yourself of your reasons for changing.  Or talk it through with someone you trust. Or get involved with a healthy, distracting activity.  Or, "urge surf" - instead of fighting the feeling, accept it and ride it out, knowing it will soon crest like a wave and pass. Know Your "No".  Have a polite, convincing "no thanks" for those times when you may be offered a drink and don't want one.  The faster you can say no to these offers, the less likely you are to give in.  If you hesitate, it allows you time to think of excuses to go along.        24- Hour Availability:      [facility]   [address] [place], [place] [phone] Crisis [phone]    [facility] [phone]   [place] [facility]  [phone]     [facility]  [phone] (after hours)    Therapeutic Alternative/Mobile Crisis   [phone]    [place] National Suicide Hotline  9196812015 Len Childs) [place] [address]    Call 911 or go to emergency room    [facility]  [phone]);  [place] and [employer]  [phone]); [place], [place], [place],  [place], [place], [place], [place]   Follow up goal      Follow up:  Patient agrees to Care Plan and Follow-up.  Plan: The Managed Medicaid care management team will reach out to the patient again over the next 30 days.  Dickie La, BSW, MSW, [employer] Managed Medicaid LCSW [facility]  [email or web address] Phone: [phone]

## 2023-01-02 ENCOUNTER — Ambulatory Visit: Payer: 59 | Admitting: Licensed Clinical Social Worker

## 2023-01-02 ENCOUNTER — Other Ambulatory Visit: Payer: 59 | Admitting: Licensed Clinical Social Worker

## 2023-01-03 NOTE — Patient Instructions (Signed)
Visit Information  Timothy Hubbard was given information about Medicaid Managed Care team care coordination services as a part of their [facility] Community Plan Medicaid benefit. [name]'s mother verbally consented to engagement with the [facility] Managed Care team.   If you are experiencing a medical emergency, please call 911 or report to your local emergency department or urgent care.   If you have a non-emergency medical problem during routine business hours, please contact your provider's office and ask to speak with a nurse.   For questions related to your [facility], please call: [phone] or visit the homepage here: [email or web address]  If you would like to schedule transportation through your [facility], please call the following number at least 2 days in advance of your appointment: [phone]   Rides for urgent appointments can also be made after hours by calling Member Services.  Call the Behavioral Health Crisis Line at [phone], at any time, 24 hours a day, 7 days a week. If you are in danger or need immediate medical attention call 911.  If you would like help to quit smoking, call 1-800-QUIT-NOW (906 703 4655) OR Espaol: 1-855-Djelo-Ya (1-324-401-0272) o para ms informacin haga clic aqu or Text READY to 536-644 to register via text

## 2023-01-03 NOTE — Patient Outreach (Signed)
Medicaid Managed Care Social Work Note  01/02/2023 Name:  Timothy Hubbard MRN:  [id number] DOB:  03-23-1991  Timothy Hubbard is an 32 y.o. year old male who is a primary patient of Timothy Dus, MD.  The Medicaid Managed Care Coordination team was consulted for assistance with:  Mental Health Counseling and Resources  [name] was given information about Medicaid Managed Care Coordination team services today. [name] agreed to services and verbal consent obtained.  Engaged with patient  for by telephone forfollow up visit in response to referral for case management and/or care coordination services.   Assessments/Interventions:  Review of past medical history, allergies, medications, health status, including review of consultants reports, laboratory and other test data, was performed as part of comprehensive evaluation and provision of chronic care management services.  SDOH: (Social Determinant of Health) assessments and interventions performed: SDOH Interventions    Flowsheet Row Patient Outreach Telephone from 01/02/2023 in [place] POPULATION HEALTH DEPARTMENT Patient Outreach Telephone from 12/30/2022 in [place] POPULATION HEALTH DEPARTMENT Patient Outreach Telephone from 12/16/2022 in [place] POPULATION HEALTH DEPARTMENT Patient Outreach Telephone from 12/05/2022 in [place] POPULATION HEALTH DEPARTMENT Patient Outreach Telephone from 11/02/2022 in [place] POPULATION HEALTH DEPARTMENT  SDOH Interventions       Food Insecurity Interventions -- -- -- -- Intervention Not Indicated  Housing Interventions -- -- -- -- Intervention Not Indicated  Transportation Interventions -- -- -- -- Intervention Not Indicated  Utilities Interventions -- -- -- -- Intervention Not Indicated  Alcohol Usage Interventions -- -- -- Other (Comment)  [LCSW referral] --  Stress Interventions Provide Counseling, Offered [employer] Resources Provide Counseling, Offered [employer]  Wellness Resources Offered [employer], Provide Counseling -- --       Advanced Directives Status:  See Care Plan for related entries.  Care Plan                 Allergies  Allergen Reactions   Ibuprofen Anaphylaxis    Medications Reviewed Today     Reviewed by Timothy Dach, RN (Registered Nurse) on 12/05/22 at [phone]  Med List Status: <None>   Medication Order Taking? Sig Documenting Provider Last Dose Status Informant  acetaminophen (TYLENOL) 325 MG tablet [id number] Yes Take 2 tablets (650 mg total) by mouth every 6 (six) hours as needed for mild pain or moderate pain. Timothy Deeds, MD Taking Active   albuterol (PROVENTIL) (2.5 MG/3ML) 0.083% nebulizer solution [id number] Yes USE (2.5 MG TOTAL) BY NEBULIZATION EVERY 6 (SIX) HOURS AS NEEDED FOR WHEEZING OR SHORTNESS OF BREATH. Timothy Dus, MD Taking Active            Med Note (ROBB, MELANIE A   Mon Dec 05, 2022  9:16 AM)    amLODipine (NORVASC) 10 MG tablet [id number] Yes TAKE 1 TABLET (10 MG TOTAL) BY MOUTH DAILY (AM) Timothy Dus, MD Taking Active   divalproex (DEPAKOTE) 500 MG DR tablet [id number] Yes TAKE ONE TABLET BY MOUTH TWICE DAILY (AM+BEDTIME) Timothy Dus, MD Taking Active   ferrous sulfate (FEROSUL) 325 (65 FE) MG tablet [id number] Yes TAKE ONE TABLET BY MOUTH ONCE DAILY AT 12 NOON (NOON) Timothy Dus, MD Taking Active   ferrous sulfate 324 (65 Fe) MG TBEC [id number] Yes Take 1 tablet (325 mg total) by mouth daily at 12 noon. Timothy Dus, MD Taking Active   fluticasone [facility]) 50 MCG/ACT nasal spray [id number] Yes Place 2 sprays into both nostrils daily. Timothy Pho, MD Taking Active  folic acid (FOLVITE) 1 MG tablet [id number] Yes TAKE 1 TABLET (1 MG TOTAL) BY MOUTH DAILY (AM) Timothy Dus, MD Taking Active   hydrocortisone (ANUSOL-HC) 2.5 % rectal cream [id number] No Place 1 Application rectally 2 (two) times daily.  Patient not taking: Reported on 12/05/2022   Timothy Deeds, MD Not  Taking Active   lisinopril (ZESTRIL) 5 MG tablet [id number] Yes TAKE 1 TABLET (5 MG TOTAL) BY MOUTH DAILY (AM) Timothy Dus, MD Taking Active   magnesium oxide (MAG-OX) 400 (240 Mg) MG tablet [id number] No Take 1 tablet by mouth 2 (two) times daily.  Patient not taking: Reported on 11/02/2022   [provider] Not Taking Active   mometasone-formoterol [facility]) 100-5 MCG/ACT Sandrea Matte [id number] Yes INHALE TWO PUFFS INTO THE LUNGS TWICE A Timothy Friendly, MD Taking Active   Multiple Vitamin (MULTIVITAMIN WITH MINERALS) TABS tablet [id number] Yes Take 1 tablet by mouth daily. Pokhrel, Laxman, MD Taking Active   naltrexone (DEPADE) 50 MG tablet [id number] Yes TAKE 1/2 TABLET (25 MG TOTAL) BY MOUTH DAILY (AM) Timothy Dus, MD Taking Active   nicotine (NICODERM CQ - DOSED IN MG/24 HOURS) 14 mg/24hr patch [id number] No PLACE 1 PATCH (14 MG TOTAL) ONTO THE SKIN DAILY.  Patient not taking: Reported on 12/05/2022   Timothy Dus, MD Not Taking Active   OLANZapine (ZYPREXA) 5 MG tablet [id number] Yes Take 1 tablet (5 mg total) by mouth at bedtime. Future refills will need to come from psychiatry. Timothy Dus, MD Taking Active   pantoprazole (PROTONIX) 40 MG tablet [id number] Yes TAKE 1 TABLET (40 MG TOTAL) BY MOUTH DAILY (AM) Timothy Dus, MD Taking Active   polyethylene glycol (MIRALAX / GLYCOLAX) 17 g packet [id number] Yes MIX AND DRINK BY MOUTH DAILY AS NEEDED FOR MODERATE CONSTIPATION Timothy Dus, MD Taking Active   VENTOLIN HFA 108 (90 Base) MCG/ACT inhaler [id number] Yes Inhale into the lungs. [provider] Taking Active             Patient Active Problem List   Diagnosis Date Noted   Tachycardia 09/10/2022   Post-nasal drip 07/15/2022   Cognitive impairment 07/15/2022   Impaired instrumental activities of daily living (IADL) 07/15/2022   Requires assistance with activities of daily living (ADL) 07/15/2022   Ambulatory dysfunction 04/06/2022   Mood disorder (HCC)  03/31/2022   Grade III internal hemorrhoids    Adenomatous polyp of descending colon    Iron deficiency anemia 03/04/2022   Seizure (HCC)    Alcohol use disorder 10/21/2016   Gastroesophageal reflux disease 06/13/2016   Essential hypertension 04/01/2016   Asthma 04/01/2016   TOBACCO USER 06/13/2009  Goal: Addictive Behavior Managed    Start Date: 12/16/2022 Timeframe:  Short Range Goal Priority:  High                 Expected End Date: ongoing                  Follow Up Date-01/02/23 at 915 am   - avoid negative self-talk - be honest with myself - begin personal counseling - check out recovery housing - exercise at least 2 to 3 times per week - identify a recovery coach - identify triggers for relapse - join AA (Alcoholics Anonymous)  - learn how to handle negative feelings without drugs or alcohol - make a list of pleasurable things to do without using alcohol or drugs - make a plan to deal with triggers like holidays and anniversaries - make a plan to  fix broken relationships - make a plan to handle bad days - make a written relapse plan - spend time or talk with a recovery coach or support person at least 2 or 3 times every week - spend time or talk with people who do not drink or use every day    Why is this important?               It is possible that after you stop drinking or using drugs that you may slip and use again. This is called a relapse.              A relapse does not mean that you have failed. It means you have had one or a few bad days.             Make sure you know why it is important for you to stay sober and remind yourself often.              Once you figure out what triggered the relapse and take the steps to deal with it, you can be even more hopeful that recovery is possible.     Current barriers:               Mental Health needs related to Depression and Alcohol Abuse             Currently unable to independently self manage needs related to  mental health conditions.             Need for inpatient treatment especially now that insurance change recently happened             Lacks knowledge of where and how to connect for mental health/substance abuse support   Clinical Goal(s): Over the next 90 days, patient will work with SW to reduce or manage symptoms of substance abuse and will assist with securing inpatient treatment.    Clinical Interventions:              Assessed patient's previous treatment, needs, coping skills, current treatment, support system and barriers to care. Patient has never had any substance abuse treatment             Patient's mother was interviewed and appropriate assessments performed or reviewed: brief mental health assessment;Suicidal Ideation/Homicidal Ideation: No             Provided basic mental health support, education and interventions             Patient prefers for mother to be POC for [facility] LCSW. Patient is wanting to stop drinking alcohol but cannot find inpatient treatment somewhere that accepts his Medicaid, has a bed and is willing to take him as a patient. Patient will need to complete detox first before he can gain inpatient treatment. Patient [facility] go to [facility] to detox and then can go to Inpatient Treatment at his preferred facility at [facility]. SECURE email sent to patient's mother.              Relapse prevention resource education provided.              Discussed several options for long term counseling based on need and insurance              [facility] LCSW educated patient on medication therapy that can help with alcohol triggers.  Other interventions include: Motivational Interviewing             Solution-Focused Strategies             Mindfulness or Management consultant  Inter-disciplinary care team collaboration (see longitudinal plan of care)             [facility] LCSW educated patient on healthy coping skills to  implement into her daily routine to combat cravings and depressive symptoms once they arise. [facility] LCSW update- Patient's mother reports being very upset because she feels that someone through [facility] signed patient up for [facility] benefits which will affect patient from gaining inpatient treatment. [facility] LCSW completed care coordination with Pharmacist, PCP and [facility] clinical supervisor. Patient's mother received a call from [facility] clinical supervisor and they reviewed concern as this caused a barrier in getting patient into treatment. [facility] LCSW will follow up within on 12/30/22 to continue assisting family with gaining substance abuse treatment and support. 12/27/22 update- Ongoing care coordination completed with entire [facility] team on 12/26/22 and 12/27/22. Patient was signed up through [employer] for [employer] somehow and wishes to terminate plan. Patient's mother spent a lot of time on trying to terminate this health care plan yesterday and even though she is the POA, she was unable to terminate his plan as patient is currently in detox treatment and unable to contact plan himself. Patient will need inpatient SA treatment post detox discharge but it will be difficult to find a treatment facility that would accept Anora for insurance coverage and mother is aware that of this concern. [facility] LCSW 12/30/22 update- Patient's mother reports that son will discharge from [facility] for detox today. Social worker in the ED sent referral to [facility] [facility] but they declined referral as although patient has Medicaid, he also has Monia Pouch which is a Media planner and takes priority over [employer]. They do not accept [employer]. [facility] LCSW and patient's mother completed call to [facility] and spoke with admissions. Admissions department was informed of situation. Mother admits that she got the names of Kizzie Fantasia and [facility] mixed up but that she did call them this Tuesday and successfully canceled their services.  However, admissions looked up patient's insurance information and it is still showing active and will not change until the 1st of next month. [facility] LCSW sent patient's mother resources that Admissions coordinator sent [facility] LCSW. Patient's mother was advised to get proof of cancellation of services from [place] or to ask them to fax over this documentation. Contact information and resources were sent to mother's email. Mother physically wrote down important numbers as well including [facility] fax number. 01/02/23 Update- Mother of patient is in positive spirits during social work sessions today. She thanked team for the support provided throughout this inpatient placement process. She reports that she DID get a confirmation number from [place] that patient's insurance with them has been canceled as of last week. [employer] customer service faxed [facility] this information as proof that patient's source of primary insurance is Medicaid. [facility] LCSW provided positive reinforcement for patient and mother's continued efforts in getting patient the services that he needs. [facility] at [facility] by 01/04/23. [facility] LCSW will follow up on 01/05/23 to ensure placement was successful and goal was met.    Patient Goals/Self-Care Activities: Over the next 90 days             Implement interventions discussed today to decreases  alcohol usage, symptoms of stress and increase knowledge and/or ability of: coping skills, healthy habits, self-management skills, and stress reduction.   Consider becoming involved in a structured program.  You should stop drinking if:            You have tried to cut down before but have not been successful, or                  You suffer from morning shakes during a heavy drinking period, or            You have high blood pressure, or            You are pregnant, or            You have liver disease            Your alcohol use is affecting your social relationships, or             You have legal consequences like DUIs, or            You call in sick to work, or            You cannot take care of our children, or            Someone close to you says you drink too much     How Much Alcohol is a Drink: Beer: 12 oz. = 1 drink 16 oz. = 1.3 drinks 22 oz. = 2 drinks 40 oz. = 3.3 drinks   Wine: 5 oz. = 1 drink 740 mL (25 oz.) bottle = 5 drinks  Malt Liquor: 12 oz. = 1.5 drinks 16 oz. = 2 drinks 22 oz. = 2.5 drinks 40 oz. = 4.5 drinks   80-Proof Spirits - Hard Liquor: 1 shot = 1 drink 1 mixed drink = number of shots Can equal 1-3 drinks   What is Low-risk Drinking?            Have no more than 2 drinks of alcohol per day            Drink no more than 5 days per week            Do not drink alcohol drink alcohol when:               Identify Your Triggers for Drinking            Parties            Particular People            Feeling lonely            Feeling tense            Family problems            Feeling sad            Feeling happy             Feeling bored            After work            Problems sleeping            Criticism            Feelings of failure            After being paid            When others are drinking  In bars            When out for dinner            After arguments            Weekends            Feeling restless            Being in pain   Effects of High-Risk Drinking To the Brain:            Aggressive, irrational behavior            Arguments, violence            Depression, nervousness            Alcohol dependence, memory loss To the Nervous System:            Trembling hands, tingling fingers            Numbness, painful nerves            Impaired sensation leading to falls            Numb tingling toes To Your Lifestyle:            Social, legal, medical problems            Domestic trouble/relationship loss            Job loss & financial problems            Shortened life span             Accidents and death from drunk driving               To the Face:            Premature aging, drinker's nose            Cancer of the throat & mouth To the Body:            Frequent cold            Reduced resistance to infection            Increased risk of pneumonia            Weakness of heart muscle            Heart failure, anemia            Impaired blood clotting            Breast cancer            Vitamin deficiency, bleeding            Severe Inflammation of the stomach            Vomiting, diarrhea, malnutrition            Ulcer, inflammation of the pancreas            Impaired sexual performance            Birth defects, including deformities, retardation, and low birthweight   Ways to Cope Without Drinking            Go home if you tend to drink after work            Find another activity            Switch to nonalcoholic beverages            Change friends  Join a Stage manager            Visit relatives            Plan/take a trip               Go for a walk            Take up a hobby            Listen to music            Talk to a friend            Reading            What would you do if you had no worries about failing?               Good Reasons for Drinking Less            I will live longer - probably 8-10 years.            I will sleep better.            I will be happier.            I will save a lot of money            My relationships will improve.            I will stay younger for longer.            I will achieve more in my life            There will be a greater chance that I will survive to a healthy old age with no premature damage to my brain.                         I will be better at my job.            I will be less likely to feel depressed and commit suicide (6 times less likely).            I will be less likely to die of heart disease or cancer.            Other people will respect me            I will  be less likely to get into trouble with the police.            The possibility that I will die of liver disease will be dramatically reduced (12 times less likely).            It will be less likely that I will die in a car accident (3 times less likely).   Strategies for Cutting Down Keep Track.  Find a way to keep track of how much you drink.  If you make a note of each drink before you drink it, this will help slow you down. Count and Measure.  Know the standard drink sizes.  Ask the bartender or server about the amount of alcohol in a mixed drink. Set Goals.  Decide how many days a week you will drink and how many drinks each day. Pace and Space.  When you do drink, pace yourself.  Have no more than one drink with alcohol per hour.  Alternate "drink spacers" non-alcoholic drinks such as water, soda, or juice with drinks containing  alcohol. Include Food.  Don't drink on an empty stomach.  Have some food so the alcohol will be absorbed more slowly into your system.             Avoid Triggers.  Avoid people, places, or activities that have led to drinking in the past.  Certain times of day or feelings may also be triggers.  Make a plan so you will know what you can do instead of drinking. Plan to Handle Urges.  When an urge hits, consider these options:  Remind yourself of your reasons for changing.  Or talk it through with someone you trust. Or get involved with a healthy, distracting activity.  Or, "urge surf" - instead of fighting the feeling, accept it and ride it out, knowing it will soon crest like a wave and pass. Know Your "No".  Have a polite, convincing "no thanks" for those times when you may be offered a drink and don't want one.  The faster you can say no to these offers, the less likely you are to give in.  If you hesitate, it allows you time to think of excuses to go along.        24- Hour Availability:      [facility]   [address] [place],  [place] [phone] Crisis [phone]    [facility] [phone]   [place] [facility]  [phone]     [facility]  [phone] (after hours)    Therapeutic Alternative/Mobile Crisis   [phone]    [place] National Suicide Hotline  [phone] Len Child[place]) [place] [address]    Call 911 or go to emergency room    [facility]  [phone]);  [place] and [employer]  [phone]); [place], [place], [place], [place], [place], [place], [place]    Follow up:  Patient agrees to Care Plan and Follow-up.  Plan: The Managed Medicaid care management team will reach out to the patient again over the next 30 days.  Dickie La, BSW, MSW, [employer] Managed Medicaid LCSW [facility]  [facility] [email or web address] Phone: [phone]

## 2023-01-05 ENCOUNTER — Other Ambulatory Visit: Payer: 59 | Admitting: Licensed Clinical Social Worker

## 2023-01-05 NOTE — Patient Instructions (Signed)
Visit Information  Mr. Widmer was given information about Medicaid Managed Care team care coordination services as a part of their [facility] Medicaid benefit. [name] 's mother verbally consented to engagement with the [facility] Managed Care team.   If you are experiencing a medical emergency, please call 911 or report to your local emergency department or urgent care.   If you have a non-emergency medical problem during routine business hours, please contact your provider's office and ask to speak with a nurse.   For questions related to your [facility], please call: [phone] or visit the homepage here: [email or web address]  If you would like to schedule transportation through your [facility], please call the following number at least 2 days in advance of your appointment: [phone]   Rides for urgent appointments can also be made after hours by calling Member Services.  Call the Behavioral Health Crisis Line at [phone], at any time, 24 hours a day, 7 days a week. If you are in danger or need immediate medical attention call 911.  If you would like help to quit smoking, call 1-800-QUIT-NOW (8120835206) OR Espaol: 1-855-Djelo-Ya (7-253-664-4034) o para ms informacin haga clic aqu or Text READY to 742-595 to register via text  Following is a copy of your plan of care:  Care Plan : LCSW Plan of Care  Updates made by [name], LCSW since 01/05/2023 12:00 AM     Problem: Alcohol Use (Wellness)      Goal: Alcohol Use Managed   Start Date: 12/16/2022  Note:   Goal: Addictive Behavior Managed    Start Date: 12/16/2022 Timeframe:  Short Range Goal Priority:  High                 Expected End Date: ongoing                  Follow Up Date-01/13/23 at 930 am   - avoid negative self-talk - be honest with myself - begin personal  counseling - check out recovery housing - exercise at least 2 to 3 times per week - identify a recovery coach - identify triggers for relapse - join AA (Alcoholics Anonymous)  - learn how to handle negative feelings without drugs or alcohol - make a list of pleasurable things to do without using alcohol or drugs - make a plan to deal with triggers like holidays and anniversaries - make a plan to fix broken relationships - make a plan to handle bad days - make a written relapse plan - spend time or talk with a recovery coach or support person at least 2 or 3 times every week - spend time or talk with people who do not drink or use every day    Why is this important?               It is possible that after you stop drinking or using drugs that you may slip and use again. This is called a relapse.              A relapse does not mean that you have failed. It means you have had one or a few bad days.             Make sure you know why it is important for you to stay sober and remind yourself often.              Once you  figure out what triggered the relapse and take the steps to deal with it, you can be even more hopeful that recovery is possible.     Current barriers:               Mental Health needs related to Depression and Alcohol Abuse             Currently unable to independently self manage needs related to mental health conditions.             Need for inpatient treatment especially now that insurance change recently happened             Lacks knowledge of where and how to connect for mental health/substance abuse support   Clinical Goal(s): Over the next 90 days, patient will work with SW to reduce or manage symptoms of substance abuse and will assist with securing inpatient treatment.    Clinical Interventions:              Assessed patient's previous treatment, needs, coping skills, current treatment, support system and barriers to care. Patient has never had any substance abuse  treatment             Patient's mother was interviewed and appropriate assessments performed or reviewed: brief mental health assessment;Suicidal Ideation/Homicidal Ideation: No             Provided basic mental health support, education and interventions             Patient prefers for mother to be POC for [facility] LCSW. Patient is wanting to stop drinking alcohol but cannot find inpatient treatment somewhere that accepts his Medicaid, has a bed and is willing to take him as a patient. Patient will need to complete detox first before he can gain inpatient treatment. Patient can go to [facility] or [facility] to detox and then can go to Inpatient Treatment at his preferred facility at [facility]. SECURE email sent to patient's mother.              Relapse prevention resource education provided.              Discussed several options for long term counseling based on need and insurance              [facility] LCSW educated patient on medication therapy that can help with alcohol triggers.              Other interventions include: Motivational Interviewing             Solution-Focused Strategies             Mindfulness or Management consultant  Inter-disciplinary care team collaboration (see longitudinal plan of care)             [facility] LCSW educated patient on healthy coping skills to implement into her daily routine to combat cravings and depressive symptoms once they arise. [facility] LCSW update- Patient's mother reports being very upset because she feels that someone through [facility] signed patient up for [facility] benefits which will affect patient from gaining inpatient treatment. [facility] LCSW completed care coordination with Pharmacist, PCP and [facility] clinical supervisor. Patient's mother received a call from [facility] clinical supervisor and they reviewed concern as this caused a barrier in getting patient into treatment. [facility] LCSW will follow up within on 12/30/22 to continue  assisting family with gaining substance abuse treatment and support. 12/27/22 update- Ongoing  care coordination completed with entire [facility] team on 12/26/22 and 12/27/22. Patient was signed up through [employer] for [employer] somehow and wishes to terminate plan. Patient's mother spent a lot of time on trying to terminate this health care plan yesterday and even though she is the POA, she was unable to terminate his plan as patient is currently in detox treatment and unable to contact plan himself. Patient will need inpatient SA treatment post detox discharge but it will be difficult to find a treatment facility that would accept Anora for insurance coverage and mother is aware that of this concern. [facility] LCSW 12/30/22 update- Patient's mother reports that son will discharge from [facility] for detox today. Social worker in the ED sent referral to [facility] [employer] [facility] but they declined referral as although patient has Medicaid, he also has [facility]ia [facility] which is a Media planner and takes priority over [employer]. They do not accept [employer]. [facility] LCSW and patient's mother completed call to [facility] and spoke with admissions. Admissions department was informed of situation. Mother admits that she got the names of Kizzie Fantasia and [facility] mixed up but that she did call them this Tuesday and successfully canceled their services. However, admissions looked up patient's insurance information and it is still showing active and will not change until the 1st of next month. [facility] LCSW sent patient's mother resources that Admissions coordinator sent [facility] LCSW. Patient's mother was advised to get proof of cancellation of services from [facility] or to ask them to fax over this documentation. Contact information and resources were sent to mother's email. Mother physically wrote down important numbers as well including [facility] fax number. 01/02/23 Update- Mother of patient is in positive spirits during social work sessions today.  She thanked team for the support provided throughout this inpatient placement process. She reports that she DID get a confirmation number from [place] that patient's insurance with them has been canceled as of last week. [employer] customer service faxed [facility] this information as proof that patient's source of primary insurance is Medicaid. [facility] LCSW provided positive reinforcement for patient and mother's continued efforts in getting patient the services that he needs. [facility] at [facility] by 01/04/23. [facility] LCSW will follow up on 01/05/23 to ensure placement was successful and goal was met. 01/05/23- Patient's mother reports that they are addressing some urgent physical needs with patient and hope to have him placed at [facility] within 7 days. [facility] LCSW will follow up next week.    Patient Goals/Self-Care Activities: Over the next 90 days             Implement interventions discussed today to decreases alcohol usage, symptoms of stress and increase knowledge and/or ability of: coping skills, healthy habits, self-management skills, and stress reduction.   Consider becoming involved in a structured program.  You should stop drinking if:            You have tried to cut down before but have not been successful, or                  You suffer from morning shakes during a heavy drinking period, or            You have high blood pressure, or            You are pregnant, or            You have liver disease  Your alcohol use is affecting your social relationships, or            You have legal consequences like DUIs, or            You call in sick to work, or            You cannot take care of our children, or            Someone close to you says you drink too much     How Much Alcohol is a Drink: Beer: 12 oz. = 1 drink 16 oz. = 1.3 drinks 22 oz. = 2 drinks 40 oz. = 3.3 drinks   Wine: 5 oz. = 1 drink 740 mL (25 oz.) bottle = 5 drinks  Malt Liquor: 12 oz. = 1.5  drinks 16 oz. = 2 drinks 22 oz. = 2.5 drinks 40 oz. = 4.5 drinks   80-Proof Spirits - Hard Liquor: 1 shot = 1 drink 1 mixed drink = number of shots Can equal 1-3 drinks   What is Low-risk Drinking?            Have no more than 2 drinks of alcohol per day            Drink no more than 5 days per week            Do not drink alcohol drink alcohol when:               Identify Your Triggers for Drinking            Parties            Particular People            Feeling lonely            Feeling tense            Family problems            Feeling sad            Feeling happy             Feeling bored            After work            Problems sleeping            Criticism            Feelings of failure            After being paid            When others are drinking                 In bars            When out for dinner            After arguments            Weekends            Feeling restless            Being in pain   Effects of High-Risk Drinking To the Brain:            Aggressive, irrational behavior            Arguments, violence            Depression, nervousness            Alcohol dependence, memory loss To the Nervous System:  Trembling hands, tingling fingers            Numbness, painful nerves            Impaired sensation leading to falls            Numb tingling toes To Your Lifestyle:            Social, legal, medical problems            Domestic trouble/relationship loss            Job loss & financial problems            Shortened life span            Accidents and death from drunk driving               To the Face:            Premature aging, drinker's nose            Cancer of the throat & mouth To the Body:            Frequent cold            Reduced resistance to infection            Increased risk of pneumonia            Weakness of heart muscle            Heart failure, anemia            Impaired blood clotting            Breast  cancer            Vitamin deficiency, bleeding            Severe Inflammation of the stomach            Vomiting, diarrhea, malnutrition            Ulcer, inflammation of the pancreas            Impaired sexual performance            Birth defects, including deformities, retardation, and low birthweight   Ways to Cope Without Drinking            Go home if you tend to drink after work            Find another activity            Switch to nonalcoholic beverages            Change friends            Join a Stage manager            Visit relatives            Plan/take a trip               Go for a walk            Take up a hobby            Listen to music            Talk to a friend            Reading            What would you do if you had no worries about failing?  Good Reasons for Drinking Less            I will live longer - probably 8-10 years.            I will sleep better.            I will be happier.            I will save a lot of money            My relationships will improve.            I will stay younger for longer.            I will achieve more in my life            There will be a greater chance that I will survive to a healthy old age with no premature damage to my brain.                         I will be better at my job.            I will be less likely to feel depressed and commit suicide (6 times less likely).            I will be less likely to die of heart disease or cancer.            Other people will respect me            I will be less likely to get into trouble with the police.            The possibility that I will die of liver disease will be dramatically reduced (12 times less likely).            It will be less likely that I will die in a car accident (3 times less likely).   Strategies for Cutting Down Keep Track.  Find a way to keep track of how much you drink.  If you make a note of each drink before you drink  it, this will help slow you down. Count and Measure.  Know the standard drink sizes.  Ask the bartender or server about the amount of alcohol in a mixed drink. Set Goals.  Decide how many days a week you will drink and how many drinks each day. Pace and Space.  When you do drink, pace yourself.  Have no more than one drink with alcohol per hour.  Alternate "drink spacers" non-alcoholic drinks such as water, soda, or juice with drinks containing alcohol. Include Food.  Don't drink on an empty stomach.  Have some food so the alcohol will be absorbed more slowly into your system.             Avoid Triggers.  Avoid people, places, or activities that have led to drinking in the past.  Certain times of day or feelings may also be triggers.  Make a plan so you will know what you can do instead of drinking. Plan to Handle Urges.  When an urge hits, consider these options:  Remind yourself of your reasons for changing.  Or talk it through with someone you trust. Or get involved with a healthy, distracting activity.  Or, "urge surf" - instead of fighting the feeling, accept it and ride it out, knowing it will soon crest like a wave and pass. Know Your "No".  Have a polite, convincing "no thanks"  for those times when you may be offered a drink and don't want one.  The faster you can say no to these offers, the less likely you are to give in.  If you hesitate, it allows you time to think of excuses to go along.        24- Hour Availability:      [facility]   [address] [place], [place] [phone] Crisis [phone]    [facility] [phone]   [place] [facility]  [phone]     [facility]  [phone] (after hours)    Therapeutic Alternative/Mobile Crisis   [phone]    [place] National Suicide Hotline  ([phone]) [phone] [place]) [place] 063    Call 911 or go to emergency room    [facility]  [phone]);  [place] and [employer]  [phone]); [place], [place], [place], [place], [place], [place], [place]   Follow up goal

## 2023-01-05 NOTE — Patient Outreach (Signed)
Medicaid Managed Care Social Work Note  01/05/2023 Name:  Timothy Hubbard MRN:  [id number] DOB:  06-17-1991  Timothy Hubbard is an 32 y.o. year old male who is a primary patient of Maury Dus, MD.  The Medicaid Managed Care Coordination team was consulted for assistance with:  Mental Health Counseling and Resources  Mr. Paprocki was given information about Medicaid Managed Care Coordination team services today. Timothy Hubbard agreed to services and verbal consent obtained.  Engaged with patient  for by telephone forfollow up visit in response to referral for case management and/or care coordination services.   Assessments/Interventions:  Review of past medical history, allergies, medications, health status, including review of consultants reports, laboratory and other test data, was performed as part of comprehensive evaluation and provision of chronic care management services.  SDOH: (Social Determinant of Health) assessments and interventions performed: SDOH Interventions    Flowsheet Row Patient Outreach Telephone from 01/05/2023 in [place] POPULATION HEALTH DEPARTMENT Patient Outreach Telephone from 01/02/2023 in [place] POPULATION HEALTH DEPARTMENT Patient Outreach Telephone from 12/30/2022 in [place] POPULATION HEALTH DEPARTMENT Patient Outreach Telephone from 12/16/2022 in [place] POPULATION HEALTH DEPARTMENT Patient Outreach Telephone from 12/05/2022 in [place] POPULATION HEALTH DEPARTMENT Patient Outreach Telephone from 11/02/2022 in [place] POPULATION HEALTH DEPARTMENT  SDOH Interventions        Food Insecurity Interventions -- -- -- -- -- Intervention Not Indicated  Housing Interventions -- -- -- -- -- Intervention Not Indicated  Transportation Interventions -- -- -- -- -- Intervention Not Indicated  Utilities Interventions -- -- -- -- -- Intervention Not Indicated  Alcohol Usage Interventions -- -- -- -- Other (Comment)  [LCSW referral] --  Stress Interventions  Offered [employer], Provide Counseling Provide Counseling, Offered Community Wellness Resources Provide Counseling, Offered Community Wellness Resources Offered [employer], Provide Counseling -- --       Advanced Directives Status:  See Care Plan for related entries.  Care Plan                 Allergies  Allergen Reactions   Ibuprofen Anaphylaxis    Medications Reviewed Today     Reviewed by [name], RN (Registered Nurse) on 12/05/22 at [phone]  Med List Status: <None>   Medication Order Taking? Sig Documenting Provider Last Dose Status Informant  acetaminophen (TYLENOL) 325 MG tablet [id number] Yes Take 2 tablets (650 mg total) by mouth every 6 (six) hours as needed for mild pain or moderate pain. Littie Deeds, MD Taking Active   albuterol (PROVENTIL) (2.5 MG/3ML) 0.083% nebulizer solution [id number] Yes USE (2.5 MG TOTAL) BY NEBULIZATION EVERY 6 (SIX) HOURS AS NEEDED FOR WHEEZING OR SHORTNESS OF BREATH. Maury Dus, MD Taking Active            Med Note (ROBB, MELANIE A   Mon Dec 05, 2022  9:16 AM)    amLODipine (NORVASC) 10 MG tablet [id number] Yes TAKE 1 TABLET (10 MG TOTAL) BY MOUTH DAILY (AM) Maury Dus, MD Taking Active   divalproex (DEPAKOTE) 500 MG DR tablet [id number] Yes TAKE ONE TABLET BY MOUTH TWICE DAILY (AM+BEDTIME) Maury Dus, MD Taking Active   ferrous sulfate (FEROSUL) 325 (65 FE) MG tablet [id number] Yes TAKE ONE TABLET BY MOUTH ONCE DAILY AT 12 NOON (NOON) Maury Dus, MD Taking Active   ferrous sulfate 324 (65 Fe) MG TBEC [id number] Yes Take 1 tablet (325 mg total) by mouth daily at 12 noon. Maury Dus, MD Taking Active  fluticasone (FLONASE) 50 MCG/ACT nasal spray [id number] Yes Place 2 sprays into both nostrils daily. Fayette Pho, MD Taking Active   folic acid (FOLVITE) 1 MG tablet [id number] Yes TAKE 1 TABLET (1 MG TOTAL) BY MOUTH DAILY (AM) Maury Dus, MD Taking Active   hydrocortisone (ANUSOL-HC)  2.5 % rectal cream [id number] No Place 1 Application rectally 2 (two) times daily.  Patient not taking: Reported on 12/05/2022   Littie Deeds, MD Not Taking Active   lisinopril (ZESTRIL) 5 MG tablet [id number] Yes TAKE 1 TABLET (5 MG TOTAL) BY MOUTH DAILY (AM) Maury Dus, MD Taking Active   magnesium oxide (MAG-OX) 400 (240 Mg) MG tablet [id number] No Take 1 tablet by mouth 2 (two) times daily.  Patient not taking: Reported on 11/02/2022   [provider] Not Taking Active   mometasone-formoterol [facility]) 100-5 MCG/ACT Sandrea Matte [id number] Yes INHALE TWO PUFFS INTO THE LUNGS TWICE A Annia Friendly, MD Taking Active   Multiple Vitamin (MULTIVITAMIN WITH MINERALS) TABS tablet [id number] Yes Take 1 tablet by mouth daily. Pokhrel, Laxman, MD Taking Active   naltrexone (DEPADE) 50 MG tablet [id number] Yes TAKE 1/2 TABLET (25 MG TOTAL) BY MOUTH DAILY (AM) Maury Dus, MD Taking Active   nicotine (NICODERM CQ - DOSED IN MG/24 HOURS) 14 mg/24hr patch [id number] No PLACE 1 PATCH (14 MG TOTAL) ONTO THE SKIN DAILY.  Patient not taking: Reported on 12/05/2022   Maury Dus, MD Not Taking Active   OLANZapine (ZYPREXA) 5 MG tablet [id number] Yes Take 1 tablet (5 mg total) by mouth at bedtime. Future refills will need to come from psychiatry. Maury Dus, MD Taking Active   pantoprazole (PROTONIX) 40 MG tablet [id number] Yes TAKE 1 TABLET (40 MG TOTAL) BY MOUTH DAILY (AM) Maury Dus, MD Taking Active   polyethylene glycol (MIRALAX / GLYCOLAX) 17 g packet [id number] Yes MIX AND DRINK BY MOUTH DAILY AS NEEDED FOR MODERATE CONSTIPATION Maury Dus, MD Taking Active   VENTOLIN HFA 108 (90 Base) MCG/ACT inhaler [id number] Yes Inhale into the lungs. [provider] Taking Active             Patient Active Problem List   Diagnosis Date Noted   Tachycardia 09/10/2022   Post-nasal drip 07/15/2022   Cognitive impairment 07/15/2022   Impaired instrumental activities of daily living  (IADL) 07/15/2022   Requires assistance with activities of daily living (ADL) 07/15/2022   Ambulatory dysfunction 04/06/2022   Mood disorder (HCC) 03/31/2022   Grade III internal hemorrhoids    Adenomatous polyp of descending colon    Iron deficiency anemia 03/04/2022   Seizure (HCC)    Alcohol use disorder 10/21/2016   Gastroesophageal reflux disease 06/13/2016   Essential hypertension 04/01/2016   Asthma 04/01/2016   TOBACCO USER 06/13/2009   Care Plan : LCSW Plan of Care  Updates made by [name], LCSW since 01/05/2023 12:00 AM     Problem: Alcohol Use (Wellness)      Goal: Alcohol Use Managed   Start Date: 12/16/2022  Note:   Goal: Addictive Behavior Managed    Start Date: 12/16/2022 Timeframe:  Short Range Goal Priority:  High                 Expected End Date: ongoing                  Follow Up Date-01/13/23 at 930 am   - avoid negative self-talk - be honest with myself - begin personal counseling - check out recovery housing -  exercise at least 2 to 3 times per week - identify a recovery coach - identify triggers for relapse - join AA (Alcoholics Anonymous)  - learn how to handle negative feelings without drugs or alcohol - make a list of pleasurable things to do without using alcohol or drugs - make a plan to deal with triggers like holidays and anniversaries - make a plan to fix broken relationships - make a plan to handle bad days - make a written relapse plan - spend time or talk with a recovery coach or support person at least 2 or 3 times every week - spend time or talk with people who do not drink or use every day    Why is this important?               It is possible that after you stop drinking or using drugs that you may slip and use again. This is called a relapse.              A relapse does not mean that you have failed. It means you have had one or a few bad days.             Make sure you know why it is important for you to stay sober and  remind yourself often.              Once you figure out what triggered the relapse and take the steps to deal with it, you can be even more hopeful that recovery is possible.     Current barriers:               Mental Health needs related to Depression and Alcohol Abuse             Currently unable to independently self manage needs related to mental health conditions.             Need for inpatient treatment especially now that insurance change recently happened             Lacks knowledge of where and how to connect for mental health/substance abuse support   Clinical Goal(s): Over the next 90 days, patient will work with SW to reduce or manage symptoms of substance abuse and will assist with securing inpatient treatment.    Clinical Interventions:              Assessed patient's previous treatment, needs, coping skills, current treatment, support system and barriers to care. Patient has never had any substance abuse treatment             Patient's mother was interviewed and appropriate assessments performed or reviewed: brief mental health assessment;Suicidal Ideation/Homicidal Ideation: No             Provided basic mental health support, education and interventions             Patient prefers for mother to be POC for [facility] LCSW. Patient is wanting to stop drinking alcohol but cannot find inpatient treatment somewhere that accepts his Medicaid, has a bed and is willing to take him as a patient. Patient will need to complete detox first before he can gain inpatient treatment. Patient can go to [facility], [facility] [place] [facility] to detox and then can go to Inpatient Treatment at his preferred facility at [facility]. SECURE email sent to patient's mother.  Relapse prevention resource education provided.              Discussed several options for long term counseling based on need and insurance              [facility] LCSW educated patient on medication  therapy that can help with alcohol triggers.              Other interventions include: Motivational Interviewing             Solution-Focused Strategies             Mindfulness or Management consultant  Inter-disciplinary care team collaboration (see longitudinal plan of care)             [facility] LCSW educated patient on healthy coping skills to implement into her daily routine to combat cravings and depressive symptoms once they arise. [facility] LCSW update- Patient's mother reports being very upset because she feels that someone through [facility] signed patient up for [facility] benefits which will affect patient from gaining inpatient treatment. [facility] LCSW completed care coordination with Pharmacist, PCP and [facility] clinical supervisor. Patient's mother received a call from [facility] clinical supervisor and they reviewed concern as this caused a barrier in getting patient into treatment. [facility] LCSW will follow up within on 12/30/22 to continue assisting family with gaining substance abuse treatment and support. 12/27/22 update- Ongoing care coordination completed with entire [facility] team on 12/26/22 and 12/27/22. Patient was signed up through [employer] for [employer] somehow and wishes to terminate plan. Patient's mother spent a lot of time on trying to terminate this health care plan yesterday and even though she is the POA, she was unable to terminate his plan as patient is currently in detox treatment and unable to contact plan himself. Patient will need inpatient SA treatment post detox discharge but it will be difficult to find a treatment facility that would accept [place] for insurance coverage and mother is aware that of this concern. [facility] LCSW 12/30/22 update- Patient's mother reports that son will discharge from [facility] for detox today. Social worker in the ED sent referral to [facility] [facility] but they declined referral as although patient has Medicaid, he also has Monia Pouch which is a Marine scientist and takes priority over [employer]. They do not accept [employer]. [facility] LCSW and patient's mother completed call to [facility] and spoke with admissions. Admissions department was informed of situation. Mother admits that she got the names of Kizzie Fantasia and [facility] mixed up but that she did call them this Tuesday and successfully canceled their services. However, admissions looked up patient's insurance information and it is still showing active and will not change until the 1st of next month. [facility] LCSW sent patient's mother resources that Admissions coordinator sent [facility] LCSW. Patient's mother was advised to get proof of cancellation of services from [facility] or to ask them to fax over this documentation. Contact information and resources were sent to mother's email. Mother physically wrote down important numbers as well including [facility] fax number. 01/02/23 Update- Mother of patient is in positive spirits during social work sessions today. She thanked team for the support provided throughout this inpatient placement process. She reports that she DID get a confirmation number from [place] that patient's insurance with them has been canceled as of last week. [employer] customer service faxed [facility] this information as proof that patient's source of primary insurance is Medicaid. [facility] LCSW provided positive reinforcement for  patient and mother's continued efforts in getting patient the services that he needs. [facility] by 01/04/23. [facility] LCSW will follow up on 01/05/23 to ensure placement was successful and goal was met. 01/05/23- Patient's mother reports that they are addressing some urgent physical needs with patient and hope to have him placed at [facility] within 7 days. [facility] LCSW will follow up next week.    Patient Goals/Self-Care Activities: Over the next 90 days             Implement interventions discussed today to decreases alcohol usage, symptoms of stress and increase knowledge  and/or ability of: coping skills, healthy habits, self-management skills, and stress reduction.   Consider becoming involved in a structured program.  You should stop drinking if:            You have tried to cut down before but have not been successful, or                  You suffer from morning shakes during a heavy drinking period, or            You have high blood pressure, or            You are pregnant, or            You have liver disease            Your alcohol use is affecting your social relationships, or            You have legal consequences like DUIs, or            You call in sick to work, or            You cannot take care of our children, or            Someone close to you says you drink too much     How Much Alcohol is a Drink: Beer: 12 oz. = 1 drink 16 oz. = 1.3 drinks 22 oz. = 2 drinks 40 oz. = 3.3 drinks   Wine: 5 oz. = 1 drink 740 mL (25 oz.) bottle = 5 drinks  Malt Liquor: 12 oz. = 1.5 drinks 16 oz. = 2 drinks 22 oz. = 2.5 drinks 40 oz. = 4.5 drinks   80-Proof Spirits - Hard Liquor: 1 shot = 1 drink 1 mixed drink = number of shots Can equal 1-3 drinks   What is Low-risk Drinking?            Have no more than 2 drinks of alcohol per day            Drink no more than 5 days per week            Do not drink alcohol drink alcohol when:               Identify Your Triggers for Drinking            Parties            Particular People            Feeling lonely            Feeling tense            Family problems            Feeling sad            Feeling happy  Feeling bored            After work            Problems sleeping            Criticism            Feelings of failure            After being paid            When others are drinking                 In bars            When out for dinner            After arguments            Weekends            Feeling restless            Being in pain   Effects of High-Risk Drinking To the  Brain:            Aggressive, irrational behavior            Arguments, violence            Depression, nervousness            Alcohol dependence, memory loss To the Nervous System:            Trembling hands, tingling fingers            Numbness, painful nerves            Impaired sensation leading to falls            Numb tingling toes To Your Lifestyle:            Social, legal, medical problems            Domestic trouble/relationship loss            Job loss & financial problems            Shortened life span            Accidents and death from drunk driving               To the Face:            Premature aging, drinker's nose            Cancer of the throat & mouth To the Body:            Frequent cold            Reduced resistance to infection            Increased risk of pneumonia            Weakness of heart muscle            Heart failure, anemia            Impaired blood clotting            Breast cancer            Vitamin deficiency, bleeding            Severe Inflammation of the stomach            Vomiting, diarrhea, malnutrition            Ulcer, inflammation of the pancreas            Impaired sexual performance  Birth defects, including deformities, retardation, and low birthweight   Ways to Cope Without Drinking            Go home if you tend to drink after work            Find another activity            Switch to nonalcoholic beverages            Change friends            Join a Stage manager            Visit relatives            Plan/take a trip               Go for a walk            Take up a hobby            Listen to music            Talk to a friend            Reading            What would you do if you had no worries about failing?               Good Reasons for Drinking Less            I will live longer - probably 8-10 years.            I will sleep better.            I will be happier.            I will save  a lot of money            My relationships will improve.            I will stay younger for longer.            I will achieve more in my life            There will be a greater chance that I will survive to a healthy old age with no premature damage to my brain.                         I will be better at my job.            I will be less likely to feel depressed and commit suicide (6 times less likely).            I will be less likely to die of heart disease or cancer.            Other people will respect me            I will be less likely to get into trouble with the police.            The possibility that I will die of liver disease will be dramatically reduced (12 times less likely).            It will be less likely that I will die in a car accident (3 times less likely).   Strategies for Cutting Down Keep Track.  Find a way to keep track of how much you drink.  If you make a note of each drink  before you drink it, this will help slow you down. Count and Measure.  Know the standard drink sizes.  Ask the bartender or server about the amount of alcohol in a mixed drink. Set Goals.  Decide how many days a week you will drink and how many drinks each day. Pace and Space.  When you do drink, pace yourself.  Have no more than one drink with alcohol per hour.  Alternate "drink spacers" non-alcoholic drinks such as water, soda, or juice with drinks containing alcohol. Include Food.  Don't drink on an empty stomach.  Have some food so the alcohol will be absorbed more slowly into your system.             Avoid Triggers.  Avoid people, places, or activities that have led to drinking in the past.  Certain times of day or feelings may also be triggers.  Make a plan so you will know what you can do instead of drinking. Plan to Handle Urges.  When an urge hits, consider these options:  Remind yourself of your reasons for changing.  Or talk it through with someone you trust. Or get involved with a  healthy, distracting activity.  Or, "urge surf" - instead of fighting the feeling, accept it and ride it out, knowing it will soon crest like a wave and pass. Know Your "No".  Have a polite, convincing "no thanks" for those times when you may be offered a drink and don't want one.  The faster you can say no to these offers, the less likely you are to give in.  If you hesitate, it allows you time to think of excuses to go along.        24- Hour Availability:      [facility]   [address] [place], [place] [phone] Crisis [phone]    [facility] [phone]   [place] [facility]  [phone]     [facility]  [phone] (after hours)    [facility] Alternative/Mobile Crisis   [phone]    [place] National Suicide Hotline  559-477-3942 Len Childs) [place] [address]    Call 911 or go to emergency room    [facility]  [phone]);  [place] and [employer]  [phone]); [place], [place], [place], [place], [place], [place], [place]   Follow up goal      Follow up:  Patient agrees to Care Plan and Follow-up.  Plan: The Managed Medicaid care management team will reach out to the patient again over the next 30 days.  Dickie La, BSW, MSW, [employer] Managed Medicaid LCSW [facility]  [facility] [email or web address] Phone: [phone]

## 2023-01-06 ENCOUNTER — Other Ambulatory Visit: Payer: 59 | Admitting: *Deleted

## 2023-01-06 NOTE — Patient Instructions (Signed)
Visit Information  [name]  - as a part of your Medicaid benefit, you are eligible for care management and care coordination services at no cost or copay. I was unable to reach you by phone today but would be happy to help you with your health related needs. Please feel free to call me @ [phone].   A member of the Managed Medicaid care management team will reach out to you again over the next 7 days.   Genova Kiner RN, BSN [place]  Managed Medicaid RN Care Coordinator [phone]   

## 2023-01-06 NOTE — Patient Outreach (Signed)
  Medicaid Managed Care   Unsuccessful Attempt Note   01/06/2023 Name: Timothy Hubbard MRN: [id number] DOB: 01-22-1991  Referred by: Maury Dus, MD Reason for referral : High Risk Managed Medicaid (Unsuccessful RNCM follow up telephone outreach)   An unsuccessful telephone outreach was attempted today. The patient was referred to the case management team for assistance with care management and care coordination.    Follow Up Plan: The Managed Medicaid care management team will reach out to the patient again over the next 7 days.    Estanislado Emms RN, BSN [place]  Managed [facility] RN Care Coordinator [phone]

## 2023-01-07 DIAGNOSIS — R4189 Other symptoms and signs involving cognitive functions and awareness: Secondary | ICD-10-CM | POA: Diagnosis not present

## 2023-01-08 DIAGNOSIS — R4189 Other symptoms and signs involving cognitive functions and awareness: Secondary | ICD-10-CM | POA: Diagnosis not present

## 2023-01-09 ENCOUNTER — Ambulatory Visit: Payer: 59 | Admitting: Licensed Clinical Social Worker

## 2023-01-09 DIAGNOSIS — R4189 Other symptoms and signs involving cognitive functions and awareness: Secondary | ICD-10-CM | POA: Diagnosis not present

## 2023-01-10 DIAGNOSIS — R4189 Other symptoms and signs involving cognitive functions and awareness: Secondary | ICD-10-CM | POA: Diagnosis not present

## 2023-01-11 DIAGNOSIS — R4189 Other symptoms and signs involving cognitive functions and awareness: Secondary | ICD-10-CM | POA: Diagnosis not present

## 2023-01-12 ENCOUNTER — Ambulatory Visit: Payer: Medicaid Other | Admitting: *Deleted

## 2023-01-12 DIAGNOSIS — R4189 Other symptoms and signs involving cognitive functions and awareness: Secondary | ICD-10-CM | POA: Diagnosis not present

## 2023-01-13 ENCOUNTER — Other Ambulatory Visit: Payer: Medicaid Other | Admitting: Licensed Clinical Social Worker

## 2023-01-13 DIAGNOSIS — R4189 Other symptoms and signs involving cognitive functions and awareness: Secondary | ICD-10-CM | POA: Diagnosis not present

## 2023-01-13 NOTE — Patient Outreach (Signed)
Medicaid Managed Care Social Work Note  01/13/2023 Name:  Timothy Hubbard MRN:  [id number] DOB:  1990/10/22  Timothy Hubbard is an 32 y.o. year old male who is Hubbard primary patient of Timothy Hubbard.  The Medicaid Managed Care Coordination team was consulted for assistance with:  Mental Health Counseling and Resources  Timothy Hubbard was given information about Medicaid Managed Care Coordination team services today. Timothy Hubbard agreed to services and verbal consent obtained.  Engaged with patient  for by telephone forfollow up visit in response to referral for case management and/or care coordination services.   Assessments/Interventions:  Review of past medical history, allergies, medications, health status, including review of consultants reports, laboratory and other test data, was performed as part of comprehensive evaluation and provision of chronic care management services.  SDOH: (Social Determinant of Health) assessments and interventions performed: SDOH Interventions    Flowsheet Row Patient Outreach Telephone from 01/05/2023 in [place] POPULATION HEALTH DEPARTMENT Patient Outreach Telephone from 01/02/2023 in [place] POPULATION HEALTH DEPARTMENT Patient Outreach Telephone from 12/30/2022 in [place] POPULATION HEALTH DEPARTMENT Patient Outreach Telephone from 12/16/2022 in [place] POPULATION HEALTH DEPARTMENT Patient Outreach Telephone from 12/05/2022 in [place] POPULATION HEALTH DEPARTMENT Patient Outreach Telephone from 11/02/2022 in [place] POPULATION HEALTH DEPARTMENT  SDOH Interventions        Food Insecurity Interventions -- -- -- -- -- Intervention Not Indicated  Housing Interventions -- -- -- -- -- Intervention Not Indicated  Transportation Interventions -- -- -- -- -- Intervention Not Indicated  Utilities Interventions -- -- -- -- -- Intervention Not Indicated  Alcohol Usage Interventions -- -- -- -- Other (Comment)  [LCSW referral] --  Stress Interventions  Offered [employer], Provide Counseling Provide Counseling, Offered Community Wellness Resources Provide Counseling, Offered Community Wellness Resources Offered [employer], Provide Counseling -- --       Advanced Directives Status:  See Care Plan for related entries.  Care Plan                 Allergies  Allergen Reactions   Ibuprofen Anaphylaxis    Medications Reviewed Today     Reviewed by Timothy Dach, RN (Registered Nurse) on 12/05/22 at [phone]  Med List Status: <None>   Medication Order Taking? Sig Documenting Provider Last Dose Status Informant  acetaminophen (TYLENOL) 325 MG tablet [id number] Yes Take 2 tablets (650 mg total) by mouth every 6 (six) hours as needed for mild pain or moderate pain. Timothy Hubbard Taking Active   albuterol (PROVENTIL) (2.5 MG/3ML) 0.083% nebulizer solution [id number] Yes USE (2.5 MG TOTAL) BY NEBULIZATION EVERY 6 (SIX) HOURS AS NEEDED FOR WHEEZING OR SHORTNESS OF BREATH. Timothy Hubbard Taking Active            Med Note (Timothy Hubbard   Mon Dec 05, 2022  9:16 AM)    amLODipine (NORVASC) 10 MG tablet [id number] Yes TAKE 1 TABLET (10 MG TOTAL) BY MOUTH DAILY (AM) Timothy Hubbard Taking Active   divalproex (DEPAKOTE) 500 MG DR tablet [id number] Yes TAKE ONE TABLET BY MOUTH TWICE DAILY (AM+BEDTIME) Timothy Hubbard Taking Active   ferrous sulfate (FEROSUL) 325 (65 FE) MG tablet [id number] Yes TAKE ONE TABLET BY MOUTH ONCE DAILY AT 12 NOON (NOON) Timothy Hubbard Taking Active   ferrous sulfate 324 (65 Fe) MG TBEC [id number] Yes Take 1 tablet (325 mg total) by mouth daily at 12 noon. Timothy Hubbard Taking Active  fluticasone (FLONASE) 50 MCG/ACT nasal spray [id number] Yes Place 2 sprays into both nostrils daily. Timothy Hubbard Taking Active   folic acid (FOLVITE) 1 MG tablet [id number] Yes TAKE 1 TABLET (1 MG TOTAL) BY MOUTH DAILY (AM) Timothy Hubbard Taking Active   hydrocortisone (ANUSOL-HC)  2.5 % rectal cream [id number] No Place 1 Application rectally 2 (two) times daily.  Patient not taking: Reported on 12/05/2022   Timothy Hubbard Not Taking Active   lisinopril (ZESTRIL) 5 MG tablet [id number] Yes TAKE 1 TABLET (5 MG TOTAL) BY MOUTH DAILY (AM) Timothy Hubbard Taking Active   magnesium oxide (MAG-OX) 400 (240 Mg) MG tablet [id number] No Take 1 tablet by mouth 2 (two) times daily.  Patient not taking: Reported on 11/02/2022   Provider, Historical, Hubbard Not Taking Active   mometasone-formoterol [facility]) 100-5 MCG/ACT Sandrea Matte [id number] Yes INHALE TWO PUFFS INTO THE LUNGS TWICE Hubbard Timothy Friendly, Hubbard Taking Active   Multiple Vitamin (MULTIVITAMIN WITH MINERALS) TABS tablet [id number] Yes Take 1 tablet by mouth daily. Timothy Hubbard Taking Active   naltrexone (DEPADE) 50 MG tablet [id number] Yes TAKE 1/2 TABLET (25 MG TOTAL) BY MOUTH DAILY (AM) Timothy Hubbard Taking Active   nicotine (NICODERM CQ - DOSED IN MG/24 HOURS) 14 mg/24hr patch [id number] No PLACE 1 PATCH (14 MG TOTAL) ONTO THE SKIN DAILY.  Patient not taking: Reported on 12/05/2022   Timothy Hubbard Not Taking Active   OLANZapine (ZYPREXA) 5 MG tablet [id number] Yes Take 1 tablet (5 mg total) by mouth at bedtime. Future refills will need to come from psychiatry. Timothy Hubbard Taking Active   pantoprazole (PROTONIX) 40 MG tablet [id number] Yes TAKE 1 TABLET (40 MG TOTAL) BY MOUTH DAILY (AM) Timothy Hubbard Taking Active   polyethylene glycol (MIRALAX / GLYCOLAX) 17 g packet [id number] Yes MIX AND DRINK BY MOUTH DAILY AS NEEDED FOR MODERATE CONSTIPATION Timothy Hubbard Taking Active   VENTOLIN HFA 108 (90 Base) MCG/ACT inhaler [id number] Yes Inhale into the lungs. Provider, Historical, Hubbard Taking Active             Patient Active Problem List   Diagnosis Date Noted   Tachycardia 09/10/2022   Post-nasal drip 07/15/2022   Cognitive impairment 07/15/2022   Impaired instrumental activities of daily living  (IADL) 07/15/2022   Requires assistance with activities of daily living (ADL) 07/15/2022   Ambulatory dysfunction 04/06/2022   Mood disorder (HCC) 03/31/2022   Grade III internal hemorrhoids    Adenomatous polyp of descending colon    Iron deficiency anemia 03/04/2022   Seizure (HCC)    Alcohol use disorder 10/21/2016   Gastroesophageal reflux disease 06/13/2016   Essential hypertension 04/01/2016   Asthma 04/01/2016   TOBACCO USER 06/13/2009    Care Plan : LCSW Plan of Care  Updates made by [name], LCSW since 01/13/2023 12:00 AM     Problem: Alcohol Use (Wellness)      Goal: Alcohol Use Managed   Start Date: 12/16/2022  Note:   Goal: Addictive Behavior Managed    Start Date: 12/16/2022 Timeframe:  Short Range Goal Priority:  High                 Expected End Date: ongoing                  Follow Up Date-01/20/23 at 230 pm   - avoid negative self-talk - be honest with myself - begin personal counseling - check out recovery  housing - exercise at least 2 to 3 times per week - identify Hubbard recovery coach - identify triggers for relapse - join AA (Alcoholics Anonymous)  - learn how to handle negative feelings without drugs or alcohol - make Hubbard list of pleasurable things to do without using alcohol or drugs - make Hubbard plan to deal with triggers like holidays and anniversaries - make Hubbard plan to fix broken relationships - make Hubbard plan to handle bad days - make Hubbard written relapse plan - spend time or talk with Hubbard recovery coach or support person at least 2 or 3 times every week - spend time or talk with people who do not drink or use every day    Why is this important?               It is possible that after you stop drinking or using drugs that you may slip and use again. This is called Hubbard relapse.              Hubbard relapse does not mean that you have failed. It means you have had one or Hubbard few bad days.             Make sure you know why it is important for you to stay sober  and remind yourself often.              Once you figure out what triggered the relapse and take the steps to deal with it, you can be even more hopeful that recovery is possible.     Current barriers:               Mental Health needs related to Depression and Alcohol Abuse             Currently unable to independently self manage needs related to mental health conditions.             Need for inpatient treatment especially now that insurance change recently happened             Lacks knowledge of where and how to connect for mental health/substance abuse support   Clinical Goal(s): Over the next 90 days, patient will work with SW to reduce or manage symptoms of substance abuse and will assist with securing inpatient treatment.    Clinical Interventions:              Assessed patient's previous treatment, needs, coping skills, current treatment, support system and barriers to care. Patient has never had any substance abuse treatment             Patient's mother was interviewed and appropriate assessments performed or reviewed: brief mental health assessment;Suicidal Ideation/Homicidal Ideation: No             Provided basic mental health support, education and interventions             Patient prefers for mother to be POC for [facility] LCSW. Patient is wanting to stop drinking alcohol but cannot find inpatient treatment somewhere that accepts his Medicaid, has Hubbard bed and is willing to take him as Hubbard patient. Patient will need to complete detox first before he can gain inpatient treatment. [facility] to detox and then can go to Inpatient Treatment at his preferred facility at [facility]. SECURE email sent to patient's mother.  Relapse prevention resource education provided.              Discussed several options for long term counseling based on need and insurance              [facility] LCSW educated patient on  medication therapy that can help with alcohol triggers.              Other interventions include: Motivational Interviewing             Solution-Focused Strategies             Mindfulness or Management consultant  Inter-disciplinary care team collaboration (see longitudinal plan of care)             [facility] LCSW educated patient on healthy coping skills to implement into her daily routine to combat cravings and depressive symptoms once they arise. [facility] LCSW update- Patient's mother reports being very upset because she feels that someone through [facility] signed patient up for [facility] benefits which will affect patient from gaining inpatient treatment. [facility] LCSW completed care coordination with Pharmacist, PCP and [facility] clinical supervisor. Patient's mother received Hubbard call from [facility] clinical supervisor and they reviewed concern as this caused Hubbard barrier in getting patient into treatment. [facility] LCSW will follow up within on 12/30/22 to continue assisting family with gaining substance abuse treatment and support. 12/27/22 update- Ongoing care coordination completed with entire [facility] team on 12/26/22 and 12/27/22. Patient was signed up through [employer] for [employer] somehow and wishes to terminate plan. Patient's mother spent Hubbard lot of time on trying to terminate this health care plan yesterday and even though she is the POA, she was unable to terminate his plan as patient is currently in detox treatment and unable to contact plan himself. Patient will need inpatient SA treatment post detox discharge but it will be difficult to find Hubbard treatment facility that would accept [place] for insurance coverage and mother is aware that of this concern. [facility] LCSW 12/30/22 update- Patient's mother reports that son will discharge from [facility] for detox today. Social worker in the ED sent referral to [facility] [employer] but they declined referral as although patient has Medicaid, he also has Monia Pouch which is Hubbard  Media planner and takes priority over [employer]. They do not accept [employer]. [facility] LCSW and patient's mother completed call to [facility] and spoke with admissions. Admissions department was informed of situation. Mother admits that she got the names of Kizzie Fantasia and [facility] mixed up but that she did call them this Tuesday and successfully canceled their services. However, admissions looked up patient's insurance information and it is still showing active and will not change until the 1st of next month. [facility] LCSW sent patient's mother resources that Admissions coordinator sent [facility] LCSW. Patient's mother was advised to get proof of cancellation of services from [place] or to ask them to fax over this documentation. Contact information and resources were sent to mother's email. Mother physically wrote down important numbers as well including [facility] fax number. 01/02/23 Update- Mother of patient is in positive spirits during social work sessions today. She thanked team for the support provided throughout this inpatient placement process. She reports that she DID get Hubbard confirmation number from [place] that patient's insurance with them has been canceled as of last week. [employer] customer service faxed [facility] this information as proof that patient's source of primary insurance is Medicaid. [facility] LCSW provided positive reinforcement for  patient and mother's continued efforts in getting patient the services that he needs. [facility] by 01/04/23. [facility] LCSW will follow up on 01/05/23 to ensure placement was successful and goal was met. 01/05/23- Patient's mother reports that they are addressing some urgent physical needs with patient and hope to have him placed at [facility] within 7 days. [facility] LCSW will follow up next week. Update- Although [employer]IC is showing discontinued insurance of Monia Pouch, mother reports [facility] is still not accepting him for inpatient. Email sent to [facility] inquiring per mother's  request.   Patient Goals/Self-Care Activities: Over the next 90 days             Implement interventions discussed today to decreases alcohol usage, symptoms of stress and increase knowledge and/or ability of: coping skills, healthy habits, self-management skills, and stress reduction.   Consider becoming involved in Hubbard structured program.  You should stop drinking if:            You have tried to cut down before but have not been successful, or                  You suffer from morning shakes during Hubbard heavy drinking period, or            You have high blood pressure, or            You are pregnant, or            You have liver disease            Your alcohol use is affecting your social relationships, or            You have legal consequences like DUIs, or            You call in sick to work, or            You cannot take care of our children, or            Someone close to you says you drink too much     How Much Alcohol is Hubbard Drink: Beer: 12 oz. = 1 drink 16 oz. = 1.3 drinks 22 oz. = 2 drinks 40 oz. = 3.3 drinks   Wine: 5 oz. = 1 drink 740 mL (25 oz.) bottle = 5 drinks  Malt Liquor: 12 oz. = 1.5 drinks 16 oz. = 2 drinks 22 oz. = 2.5 drinks 40 oz. = 4.5 drinks   80-Proof Spirits - Hard Liquor: 1 shot = 1 drink 1 mixed drink = number of shots Can equal 1-3 drinks   What is Low-risk Drinking?            Have no more than 2 drinks of alcohol per day            Drink no more than 5 days per week            Do not drink alcohol drink alcohol when:               Identify Your Triggers for Drinking            Parties            Particular People            Feeling lonely            Feeling tense            Family problems  Feeling sad            Feeling happy             Feeling bored            After work            Problems sleeping            Criticism            Feelings of failure            After being paid            When others are drinking                  In bars            When out for dinner            After arguments            Weekends            Feeling restless            Being in pain   Effects of High-Risk Drinking To the Brain:            Aggressive, irrational behavior            Arguments, violence            Depression, nervousness            Alcohol dependence, memory loss To the Nervous System:            Trembling hands, tingling fingers            Numbness, painful nerves            Impaired sensation leading to falls            Numb tingling toes To Your Lifestyle:            Social, legal, medical problems            Domestic trouble/relationship loss            Job loss & financial problems            Shortened life span            Accidents and death from drunk driving               To the Face:            Premature aging, drinker's nose            Cancer of the throat & mouth To the Body:            Frequent cold            Reduced resistance to infection            Increased risk of pneumonia            Weakness of heart muscle            Heart failure, anemia            Impaired blood clotting            Breast cancer            Vitamin deficiency, bleeding            Severe Inflammation of the stomach            Vomiting, diarrhea, malnutrition  Ulcer, inflammation of the pancreas            Impaired sexual performance            Birth defects, including deformities, retardation, and low birthweight   Ways to Cope Without Drinking            Go home if you tend to drink after work            Find another activity            Switch to nonalcoholic beverages            Change friends            Join Hubbard Stage manager            Visit relatives            Plan/take Hubbard trip               Go for Hubbard walk            Take up Hubbard hobby            Listen to music            Talk to Hubbard friend            Reading            What would you do if you had no worries about failing?                Good Reasons for Drinking Less            I will live longer - probably 8-10 years.            I will sleep better.            I will be happier.            I will save Hubbard lot of money            My relationships will improve.            I will stay younger for longer.            I will achieve more in my life            There will be Hubbard greater chance that I will survive to Hubbard healthy old age with no premature damage to my brain.                         I will be better at my job.            I will be less likely to feel depressed and commit suicide (6 times less likely).            I will be less likely to die of heart disease or cancer.            Other people will respect me            I will be less likely to get into trouble with the police.            The possibility that I will die of liver disease will be dramatically reduced (12 times less likely).            It will be less likely that I will die in Hubbard car accident (3 times less  likely).   Strategies for Cutting Down Keep Track.  Find Hubbard way to keep track of how much you drink.  If you make Hubbard note of each drink before you drink it, this will help slow you down. Count and Measure.  Know the standard drink sizes.  Ask the bartender or server about the amount of alcohol in Hubbard mixed drink. Set Goals.  Decide how many days Hubbard week you will drink and how many drinks each day. Pace and Space.  When you do drink, pace yourself.  Have no more than one drink with alcohol per hour.  Alternate "drink spacers" non-alcoholic drinks such as water, soda, or juice with drinks containing alcohol. Include Food.  Don't drink on an empty stomach.  Have some food so the alcohol will be absorbed more slowly into your system.             Avoid Triggers.  Avoid people, places, or activities that have led to drinking in the past.  Certain times of day or feelings may also be triggers.  Make Hubbard plan so you will know what you can do instead of  drinking. Plan to Handle Urges.  When an urge hits, consider these options:  Remind yourself of your reasons for changing.  Or talk it through with someone you trust. Or get involved with Hubbard healthy, distracting activity.  Or, "urge surf" - instead of fighting the feeling, accept it and ride it out, knowing it will soon crest like Hubbard wave and pass. Know Your "No".  Have Hubbard polite, convincing "no thanks" for those times when you may be offered Hubbard drink and don't want one.  The faster you can say no to these offers, the less likely you are to give in.  If you hesitate, it allows you time to think of excuses to go along.        24- Hour Availability:      [facility]   [address] [place], [place] [phone] Crisis [phone]    [facility] [phone]   [place] [facility]  [phone]     [facility]  [phone] (after hours)    Therapeutic Alternative/Mobile Crisis   [phone]    [place] National Suicide Hotline  724-354-2708 [place]) [place] [address]    Call 911 or go to emergency room    [facility]  [phone]);  [place] and [employer]  [phone]); [place], [place], [place], [place], [place], [place], [place]   Follow up goal      Follow up:  Patient agrees to Care Plan and Follow-up.  Plan: The Managed Medicaid care management team will reach out to the patient again over the next 30 days.  Dickie La, BSW, MSW, [employer] Managed Medicaid LCSW [facility]  [facility] [email or web address] Phone: [phone]

## 2023-01-13 NOTE — Patient Instructions (Signed)
Visit Information  Timothy Hubbard was given information about Medicaid Managed Care team care coordination services as a part of their [facility] Community Plan Medicaid benefit. [name] verbally consented to engagement with the [facility] Managed Care team.   If you are experiencing a medical emergency, please call 911 or report to your local emergency department or urgent care.   If you have a non-emergency medical problem during routine business hours, please contact your provider's office and ask to speak with a nurse.   For questions related to your [facility], please call: [phone] or visit the homepage here: [email or web address]  If you would like to schedule transportation through your [facility], please call the following number at least 2 days in advance of your appointment: [phone]   Rides for urgent appointments can also be made after hours by calling Member Services.  Call the Behavioral Health Crisis Line at ([phone], at any time, 24 hours a day, 7 days a week. If you are in danger or need immediate medical attention call 911.  If you would like help to quit smoking, call 1-800-QUIT-NOW (985-512-3501) OR Espaol: 1-855-Djelo-Ya (0-102-725-3664) o para ms informacin haga clic aqu

## 2023-01-14 DIAGNOSIS — R4189 Other symptoms and signs involving cognitive functions and awareness: Secondary | ICD-10-CM | POA: Diagnosis not present

## 2023-01-15 DIAGNOSIS — R4189 Other symptoms and signs involving cognitive functions and awareness: Secondary | ICD-10-CM | POA: Diagnosis not present

## 2023-01-16 DIAGNOSIS — R4189 Other symptoms and signs involving cognitive functions and awareness: Secondary | ICD-10-CM | POA: Diagnosis not present

## 2023-01-17 ENCOUNTER — Other Ambulatory Visit: Payer: 59

## 2023-01-17 DIAGNOSIS — R4189 Other symptoms and signs involving cognitive functions and awareness: Secondary | ICD-10-CM | POA: Diagnosis not present

## 2023-01-17 NOTE — Patient Outreach (Signed)
  Medicaid Managed Care   Unsuccessful Outreach Note  01/17/2023 Name: Timothy Hubbard MRN: [id number] DOB: 05-06-1991  Referred by: Maury Dus, MD Reason for referral : High Risk Managed Medicaid (MM social work unsuccessful telephone outreach )   An unsuccessful telephone outreach was attempted today. The patient was referred to the case management team for assistance with care management and care coordination.   Follow Up Plan: The patient has been provided with contact information for the care management team and has been advised to call with any health related questions or concerns.   Abelino Derrick, MHA [facility] Health  Managed [facility] Social Worker [phone]

## 2023-01-17 NOTE — Patient Instructions (Signed)
  Medicaid Managed Care   Unsuccessful Outreach Note  01/17/2023 Name: Timothy Hubbard MRN: [id number] DOB: 08/01/1991  Referred by: Wells, Ashleigh, MD Reason for referral : High Risk Managed Medicaid (MM social work unsuccessful telephone outreach )   An unsuccessful telephone outreach was attempted today. The patient was referred to the case management team for assistance with care management and care coordination.   Follow Up Plan: The patient has been provided with contact information for the care management team and has been advised to call with any health related questions or concerns.   Thadd Apuzzo, BSW, MHA [place]  Managed Medicaid Social Worker [phone]  

## 2023-01-18 DIAGNOSIS — R4189 Other symptoms and signs involving cognitive functions and awareness: Secondary | ICD-10-CM | POA: Diagnosis not present

## 2023-01-19 ENCOUNTER — Other Ambulatory Visit: Payer: Medicaid Other | Admitting: *Deleted

## 2023-01-19 DIAGNOSIS — R4189 Other symptoms and signs involving cognitive functions and awareness: Secondary | ICD-10-CM | POA: Diagnosis not present

## 2023-01-19 NOTE — Patient Instructions (Signed)
Visit Information  [name]  - as a part of your Medicaid benefit, you are eligible for care management and care coordination services at no cost or copay. I was unable to reach you by phone today but would be happy to help you with your health related needs. Please feel free to call me @ [phone].   A member of the Managed Medicaid care management team will reach out to you again over the next 7 days.   Estanislado Emms RN, BSN [place]  Managed [facility] RN Care Coordinator [phone]

## 2023-01-19 NOTE — Patient Outreach (Signed)
  Medicaid Managed Care   Unsuccessful Attempt Note   01/19/2023 Name: Timothy Hubbard MRN: [id number] DOB: Jan 11, 1991  Referred by: Maury Dus, MD Reason for referral : High Risk Managed Medicaid (Unsuccessful RNCM follow up telephone outreach)   A second unsuccessful telephone outreach was attempted today. The patient was referred to the case management team for assistance with care management and care coordination.    Follow Up Plan: A HIPAA compliant phone message was left for the patient providing contact information and requesting a return call. and The Managed Medicaid care management team will reach out to the patient again over the next 7 days.    Estanislado Emms RN, BSN [place]  Managed [facility] RN Care Coordinator [phone]

## 2023-01-20 ENCOUNTER — Other Ambulatory Visit: Payer: Medicaid Other | Admitting: Licensed Clinical Social Worker

## 2023-01-20 DIAGNOSIS — R4189 Other symptoms and signs involving cognitive functions and awareness: Secondary | ICD-10-CM | POA: Diagnosis not present

## 2023-01-20 NOTE — Patient Outreach (Signed)
Care Coordination  01/20/2023  Deveron Longnecker [place] 05/09/1991 [id number]  Mother reports ongoing issues with placing patient at [facility] due to to patient's insurance coverage still showing up as primary Surveyor, mining for [facility] instead of primary insurance coverage as Medicaid so they are unable to take him for inpatient placement. Mother reports frustration with this process and reports that she would like to end [facility] program enrollment for patient. Call was ended before [facility] LCSW could complete visit. [facility] LCSW updated entire [facility] team and case closure was completed successfully. [facility] providers are happy to get back involved if patient/family desires our services in the future.  Dickie La, BSW, MSW, [employer] Managed Medicaid LCSW [facility]  [facility] [email or web address] Phone: [phone]

## 2023-01-21 DIAGNOSIS — R4189 Other symptoms and signs involving cognitive functions and awareness: Secondary | ICD-10-CM | POA: Diagnosis not present

## 2023-01-22 DIAGNOSIS — R4189 Other symptoms and signs involving cognitive functions and awareness: Secondary | ICD-10-CM | POA: Diagnosis not present

## 2023-01-23 DIAGNOSIS — R4189 Other symptoms and signs involving cognitive functions and awareness: Secondary | ICD-10-CM | POA: Diagnosis not present

## 2023-01-24 ENCOUNTER — Ambulatory Visit (INDEPENDENT_AMBULATORY_CARE_PROVIDER_SITE_OTHER): Payer: Medicaid Other | Admitting: Neurology

## 2023-01-24 ENCOUNTER — Encounter: Payer: Self-pay | Admitting: Neurology

## 2023-01-24 VITALS — BP 124/88 | HR 132 | Ht 66.0 in | Wt 116.5 lb

## 2023-01-24 DIAGNOSIS — R569 Unspecified convulsions: Secondary | ICD-10-CM | POA: Diagnosis not present

## 2023-01-24 DIAGNOSIS — R4189 Other symptoms and signs involving cognitive functions and awareness: Secondary | ICD-10-CM | POA: Diagnosis not present

## 2023-01-24 DIAGNOSIS — Z5181 Encounter for therapeutic drug level monitoring: Secondary | ICD-10-CM

## 2023-01-24 MED ORDER — DIVALPROEX SODIUM 500 MG PO DR TAB
500.0000 mg | DELAYED_RELEASE_TABLET | Freq: Two times a day (BID) | ORAL | 1 refills | Status: DC
Start: 2023-01-24 — End: 2023-04-04

## 2023-01-24 NOTE — Patient Instructions (Signed)
Continue with Depakote 500 mg twice daily for total of 1 year Will check Depakote level and LFTs, if LFTs elevated low threshold to decrease or discontinue Depakote Follow-up with the detox program Continue to follow with PCP return as needed

## 2023-01-24 NOTE — Progress Notes (Signed)
[facility]  PATIENT: Timothy Hubbard DOB: [birth year]  REQUESTING CLINICIAN: Maury Dus, MD HISTORY FROM: Patient, mother and chart review  REASON FOR VISIT: New onset seizure    HISTORICAL  CHIEF COMPLAINT:  Chief Complaint  Patient presents with   Follow-up    Rm 12, alone Sz:01/18/23 hospital followup    INTERVAL HISTORY 01/24/2023:  Patient presents today for follow-up, last visit was last July.  Since then he denies any seizure or seizure like activity.  He continues to drink almost daily.  He reports trying to get to detox.  He is compliant with the Depakote denies any side effect from the medicine.   HISTORY OF PRESENT ILLNESS:  This is a 32 year old gentleman past medical history of alcohol abuse, asthma, GERD, hypertension who is presenting after being admitted to the hospital on June 27 for new onset status epilepticus.  Patient did have MRI brain and EEG which was unremarkable, at that time he has reduced his alcohol intake and patient seizures are likely related to alcohol withdrawal also polysubstance abuse.  He was discharged first to rehab prior to home.  Since being home he is on Depakote 500 mg twice daily but reported having a drink yesterday.  Denies any seizure-like activity and report compliance with the medication. Patient denies any previous history of seizures, denies any seizure risk factors other than the polysubstance abuse.   Handedness: Right handed   Onset: 03/01/22  Seizure Type: Generalized convulsion, starring spells.   Current frequency: Only while hospitalized   Any injuries from seizures: hit his head, tongue biting   Seizure risk factors: Polysubstances abuse   Previous ASMs: Levetiracetam, Valproic acid   Currenty ASMs: Valproic Acid 500 mg BID   ASMs side effects: Denies   Brain Images: Normal Brain    Previous EEGs: Diffuse slowing, no seizures    OTHER MEDICAL CONDITIONS: Hypertension, GERD, alcohol  abuse, polysubstance abuse   REVIEW OF SYSTEMS: Full 14 system review of systems performed and negative with exception of: as noted in the HPI   Hospital course and discharge summary  Mr. Tye is a 32 yo man with a hx of HTN, asthma, GERD, ETOH use disorder presented to hospital with multiple seizure-like episodes and altered mental status.  Patient also stumbled and fell in the bathroom and had teeth grinding from seizure for around 7 minutes.  Subsequently, patient was lethargic and confused and was brought into the hospital.   Patient was initially admitted to ICU for new onset seizure-like activity and long-term monitor was initiated which was negative.  Patient was subsequently transferred out of the ICU.  He also had a fall during hospitalization and confusion. Patient required blood transfusion on 03/05/2022.  Neurology and psychiatry were consulted and patient was started on Zyprexa.  Patient also had similar seizure-like activity when family was present at bedside, EEG was again initiated on 03/08/2022 and restraints were placed in.  Psychiatry felt that this was metabolic in nature.  Video EEG did not show any seizure-like activity  Patient had tried to cut down on the alcohol recently.  No signs of withdrawal at this time.  CT MRI of the brain negative.  EEG unremarkable.  Neurology followed the patient during hospitalization. Patient is currently on Depakote from Keppra to help with behavioral issues.  Was advised seizure precautions.  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 Urine drug screen was positive for cocaine and cannabis.  Has been  started on Zyprexa.  Psychiatry had seen the patient with an impression of metabolic encephalopathy in nature and psychiatry has signed off.  Continue thiamine, folic acid and multivitamin on discharge.  Appears to be stable at this time.  ALLERGIES: Allergies  Allergen Reactions   Egg Solids, Whole Swelling    Ibuprofen Anaphylaxis   Milk (Cow) Swelling    HOME MEDICATIONS: Outpatient Medications Prior to Visit  Medication Sig Dispense Refill   acetaminophen (TYLENOL) 325 MG tablet Take 2 tablets (650 mg total) by mouth every 6 (six) hours as needed for mild pain or moderate pain. 90 tablet 0   albuterol (PROVENTIL) (2.5 MG/3ML) 0.083% nebulizer solution USE (2.5 MG TOTAL) BY NEBULIZATION EVERY 6 (SIX) HOURS AS NEEDED FOR WHEEZING OR SHORTNESS OF BREATH. 75 mL 0   amLODipine (NORVASC) 10 MG tablet TAKE 1 TABLET (10 MG TOTAL) BY MOUTH DAILY (AM) 90 tablet 0   ferrous sulfate 324 (65 Fe) MG TBEC Take 1 tablet (325 mg total) by mouth daily at 12 noon. 90 tablet 0   fluticasone (FLONASE) 50 MCG/ACT nasal spray Place 2 sprays into both nostrils daily. 16 g 6   folic acid (FOLVITE) 1 MG tablet TAKE 1 TABLET (1 MG TOTAL) BY MOUTH DAILY (AM) 90 tablet 0   hydrocortisone (ANUSOL-HC) 2.5 % rectal cream Place 1 Application rectally 2 (two) times daily. 30 g 0   lisinopril (ZESTRIL) 5 MG tablet TAKE 1 TABLET (5 MG TOTAL) BY MOUTH DAILY (AM) 90 tablet 0   magnesium oxide (MAG-OX) 400 (240 Mg) MG tablet Take 1 tablet by mouth 2 (two) times daily.     mometasone-formoterol (DULERA) 100-5 MCG/ACT AERO INHALE TWO PUFFS INTO THE LUNGS TWICE A DAY 13 g 0   Multiple Vitamin (MULTIVITAMIN WITH MINERALS) TABS tablet Take 1 tablet by mouth daily.     naltrexone (DEPADE) 50 MG tablet TAKE 1/2 TABLET (25 MG TOTAL) BY MOUTH DAILY (AM) 45 tablet 0   nicotine (NICODERM CQ - DOSED IN MG/24 HOURS) 14 mg/24hr patch PLACE 1 PATCH (14 MG TOTAL) ONTO THE SKIN DAILY. 28 patch 2   OLANZapine (ZYPREXA) 5 MG tablet Take 1 tablet (5 mg total) by mouth at bedtime. Future refills will need to come from psychiatry. 90 tablet 0   pantoprazole (PROTONIX) 40 MG tablet TAKE 1 TABLET (40 MG TOTAL) BY MOUTH DAILY (AM) 90 tablet 0   polyethylene glycol (MIRALAX / GLYCOLAX) 17 g packet MIX AND DRINK BY MOUTH DAILY AS NEEDED FOR MODERATE CONSTIPATION  30 each 0   VENTOLIN HFA 108 (90 Base) MCG/ACT inhaler Inhale into the lungs.     divalproex (DEPAKOTE) 500 MG DR tablet Take 1 tablet (500 mg total) by mouth 2 (two) times daily. 180 tablet 0   No facility-administered medications prior to visit.    PAST MEDICAL HISTORY: Past Medical History:  Diagnosis Date   Acute encephalopathy 03/10/2022   Asthma    Encephalopathy acute 03/01/2022   Hematochezia    HTN (hypertension)    Hypokalemia 03/04/2022   Need for hepatitis C screening test 04/27/2022   Seizures (HCC)     PAST SURGICAL HISTORY: Past Surgical History:  Procedure Laterality Date   COLONOSCOPY WITH PROPOFOL N/A 03/14/2022   Procedure: COLONOSCOPY WITH PROPOFOL;  Surgeon: Shellia Cleverly, DO;  Location: [facility];  Service: Gastroenterology;  Laterality: N/A;   POLYPECTOMY  03/14/2022   Procedure: POLYPECTOMY;  Surgeon: Shellia Cleverly, DO;  Location: [facility];  Service: Gastroenterology;;  FAMILY HISTORY: Family History  Problem Relation Age of Onset   Hypertension Mother    Cancer Maternal Grandfather        doesn't know the type    SOCIAL HISTORY: Social History   Socioeconomic History   Marital status: Single    Spouse name: Not on file   Number of children: 0   Years of education: Not on file   Highest education level: Not on file  Occupational History   Occupation: disablity    Comment: Doesn't want to discuss  Tobacco Use   Smoking status: Every Day    Packs/day: 0.50    Years: 8.00    Additional pack years: 0.00    Total pack years: 4.00    Types: Cigarettes    Start date: 2009   Smokeless tobacco: Never  Vaping Use   Vaping Use: Former  Substance and Sexual Activity   Alcohol use: Yes    Alcohol/week: 42.0 standard drinks of alcohol    Types: 42 Cans of beer per week    Comment: every 2-3 days   Drug use: Yes    Frequency: 7.0 times per week    Types: Marijuana    Comment: 3-5 times per day   Sexual activity: Yes     Partners: Female    Birth control/protection: Condom    Comment: occasiional condom use  Other Topics Concern   Not on file  Social History Narrative   Not on file   Social Determinants of Health   Financial Resource Strain: Medium Risk (04/01/2022)   Overall Financial Resource Strain (CARDIA)    Difficulty of Paying Living Expenses: Somewhat hard  Food Insecurity: No Food Insecurity (11/02/2022)   Hunger Vital Sign    Worried About Running Out of Food in the Last Year: Never true    Ran Out of Food in the Last Year: Never true  Transportation Needs: No Transportation Needs (11/02/2022)   PRAPARE - Transportation    Lack of Transportation (Medical): No    Lack of Transportation (Non-Medical): No  Physical Activity: Not on file  Stress: Stress Concern Present (01/05/2023)   [employer] of Occupational Health - Occupational Stress Questionnaire    Feeling of Stress : To some extent  Social Connections: Not on file  Intimate Partner Violence: Not on file    PHYSICAL EXAM    GENERAL EXAM/CONSTITUTIONAL: Vitals:  Vitals:   01/24/23 0900  BP: 124/88  Pulse: (!) 132  Weight: 116 lb 8 oz (52.8 kg)  Height: 5\' 6"  (1.676 m)    Body mass index is 18.8 kg/m. Wt Readings from Last 3 Encounters:  01/24/23 116 lb 8 oz (52.8 kg)  11/09/22 121 lb (54.9 kg)  09/09/22 130 lb 3.2 oz (59.1 kg)   Patient is in no distress; well developed, nourished and groomed; neck is supple, tremors noted on exam   MUSCULOSKELETAL: Gait, strength, tone, movements noted in Neurologic exam below  NEUROLOGIC: MENTAL STATUS:      No data to display         awake, alert, oriented to person, place and time recent and remote memory intact normal attention and concentration language fluent, comprehension intact, naming intact fund of knowledge appropriate  CRANIAL NERVE:  2nd, 3rd, 4th, 6th - visual fields full to confrontation, extraocular muscles intact, no nystagmus 5th - facial  sensation symmetric 7th - facial strength symmetric 8th - hearing intact 9th - palate elevates symmetrically, uvula midline 11th - shoulder shrug symmetric 12th -  tongue protrusion midline  MOTOR:  normal bulk and tone  GAIT/STATION:  normal    DIAGNOSTIC DATA (LABS, IMAGING, TESTING) - I reviewed patient records, labs, notes, testing and imaging myself where available.  Lab Results  Component Value Date   WBC 6.5 09/09/2022   HGB 10.0 (L) 09/09/2022   HCT 28.8 (L) 09/09/2022   MCV 87 09/09/2022   PLT 257 09/09/2022      Component Value Date/Time   NA 143 03/11/2022 0502   NA 142 02/04/2020 1101   K 3.9 03/11/2022 0502   CL 115 (H) 03/11/2022 0502   CO2 21 (L) 03/11/2022 0502   GLUCOSE 88 03/11/2022 0502   BUN 5 (L) 03/11/2022 0502   BUN 8 02/04/2020 1101   CREATININE 0.59 (L) 03/11/2022 0502   CREATININE 1.03 10/19/2016 1017   CALCIUM 8.2 (L) 03/11/2022 0502   PROT 5.2 (L) 03/11/2022 0502   ALBUMIN 2.5 (L) 03/11/2022 0502   AST 25 03/11/2022 0502   ALT 19 03/11/2022 0502   ALKPHOS 60 03/11/2022 0502   BILITOT 0.6 03/11/2022 0502   GFRNONAA >60 03/11/2022 0502   GFRNONAA >89 10/19/2016 1017   GFRAA 138 02/04/2020 1101   GFRAA >89 10/19/2016 1017   No results found for: "CHOL", "HDL", "LDLCALC", "LDLDIRECT", "TRIG" Lab Results  Component Value Date   HGBA1C <4.2 (L) 03/03/2022   Lab Results  Component Value Date   VITAMINB12 338 03/05/2022   Lab Results  Component Value Date   TSH 1.889 03/02/2022    MRI Brain 03/02/22 1. No acute intracranial pathology or epileptogenic focus identified. 2. Mild global parenchymal volume loss, accelerated for age   CTA Head and Neck 03/02/22 1. No acute intracranial pathology. 2. Normal CTA of the head and neck.  I personally reviewed brain Images and previous EEG reports.   ASSESSMENT AND PLAN  32 y.o. year old male  with history of polysubstance abuse, alcohol abuse, GERD, hypertension who is presenting for  seizure follow up.  He denies any seizures since last visit in July.  He is compliant with his Depakote 500 mg twice daily.  I will check a Depakote level and LFTs.  If LFTs are elevated, would likely decrease the Depakote.  My plan will be to continue Depakote for total of 1 year and decrease/discontinue since his seizures were related to alcohol/substance abuse.  He reports that he is actively trying to get to rehab. Encouraged him to get detox and go to an alcohol rehab facility. Continue to follow with PCP return as needed.   1. Therapeutic drug monitoring   2. Seizure [facility])      Patient Instructions  Continue with Depakote 500 mg twice daily for total of 1 year Will check Depakote level and LFTs, if LFTs elevated low threshold to decrease or discontinue Depakote Follow-up with the detox program Continue to follow with PCP return as needed     Per [facility] statutes, patients with seizures are not allowed to drive until they have been seizure-free for six months.  Other recommendations include using caution when using heavy equipment or power tools. Avoid working on ladders or at heights. Take showers instead of baths.  Do not swim alone.  Ensure the water temperature is not too high on the home water heater. Do not go swimming alone. Do not lock yourself in a room alone (i.e. bathroom). When caring for infants or small children, sit down when holding, feeding, or changing them to minimize risk of  injury to the child in the event you have a seizure. Maintain good sleep hygiene. Avoid alcohol.  Also recommend adequate sleep, hydration, good diet and minimize stress.   During the Seizure  - First, ensure adequate ventilation and place patients on the floor on their left side  Loosen clothing around the neck and ensure the airway is patent. If the patient is clenching the teeth, do not force the mouth open with any object as this can cause severe damage - Remove all items from the  surrounding that can be hazardous. The patient may be oblivious to what's happening and may not even know what he or she is doing. If the patient is confused and wandering, either gently guide him/her away and block access to outside areas - Reassure the individual and be comforting - Call 911. In most cases, the seizure ends before EMS arrives. However, there are cases when seizures may last over 3 to 5 minutes. Or the individual may have developed breathing difficulties or severe injuries. If a pregnant patient or a person with diabetes develops a seizure, it is prudent to call an ambulance. - Finally, if the patient does not regain full consciousness, then call EMS. Most patients will remain confused for about 45 to 90 minutes after a seizure, so you must use judgment in calling for help. - Avoid restraints but make sure the patient is in a bed with padded side rails - Place the individual in a lateral position with the neck slightly flexed; this will help the saliva drain from the mouth and prevent the tongue from falling backward - Remove all nearby furniture and other hazards from the area - Provide verbal assurance as the individual is regaining consciousness - Provide the patient with privacy if possible - Call for help and start treatment as ordered by the caregiver   After the Seizure (Postictal Stage)  After a seizure, most patients experience confusion, fatigue, muscle pain and/or a headache. Thus, one should permit the individual to sleep. For the next few days, reassurance is essential. Being calm and helping reorient the person is also of importance.  Most seizures are painless and end spontaneously. Seizures are not harmful to others but can lead to complications such as stress on the lungs, brain and the heart. Individuals with prior lung problems may develop labored breathing and respiratory distress.     Orders Placed This Encounter  Procedures   Valproic Acid Level   CMP     Meds ordered this encounter  Medications   divalproex (DEPAKOTE) 500 MG DR tablet    Sig: Take 1 tablet (500 mg total) by mouth 2 (two) times daily.    Dispense:  180 tablet    Refill:  1    Return if symptoms worsen or fail to improve.    Windell Norfolk, MD 01/24/2023, 9:20 AM  [facility] [address] [place], [place] [address] [phone]

## 2023-01-25 DIAGNOSIS — R4189 Other symptoms and signs involving cognitive functions and awareness: Secondary | ICD-10-CM | POA: Diagnosis not present

## 2023-01-25 LAB — COMPREHENSIVE METABOLIC PANEL
AST: 66 IU/L — ABNORMAL HIGH (ref 0–40)
Albumin/Globulin Ratio: 1.8 (ref 1.2–2.2)
Albumin: 4.1 g/dL (ref 4.1–5.1)
Glucose: 142 mg/dL — ABNORMAL HIGH (ref 70–99)
eGFR: 120 mL/min/{1.73_m2} (ref >59)

## 2023-01-26 ENCOUNTER — Telehealth: Payer: Self-pay | Admitting: Neurology

## 2023-01-26 DIAGNOSIS — R4189 Other symptoms and signs involving cognitive functions and awareness: Secondary | ICD-10-CM | POA: Diagnosis not present

## 2023-01-26 LAB — COMPREHENSIVE METABOLIC PANEL
ALT: 30 IU/L (ref 0–44)
Alkaline Phosphatase: 78 IU/L (ref 44–121)
BUN/Creatinine Ratio: 9 (ref 9–20)
BUN: 7 mg/dL (ref 6–20)
Bilirubin Total: 0.6 mg/dL (ref 0.0–1.2)
CO2: 21 mmol/L (ref 20–29)
Calcium: 8.9 mg/dL (ref 8.7–10.2)
Chloride: 103 mmol/L (ref 96–106)
Creatinine, Ser: 0.8 mg/dL (ref 0.76–1.27)
Globulin, Total: 2.3 g/dL (ref 1.5–4.5)
Potassium: 4 mmol/L (ref 3.5–5.2)
Sodium: 142 mmol/L (ref 134–144)
Total Protein: 6.4 g/dL (ref 6.0–8.5)

## 2023-01-26 LAB — VALPROIC ACID LEVEL: Valproic Acid Lvl: 8 ug/mL — ABNORMAL LOW (ref 50–100)

## 2023-01-26 NOTE — Progress Notes (Signed)
Please call and advise the patient that the recent Depakote was very low. Please advise him to take his meds as prescribed. Please remind patient to keep any upcoming appointments or tests and to call us with any interim questions, concerns, problems or updates. Thanks,   Windell Norfolk, MD

## 2023-01-26 NOTE — Telephone Encounter (Signed)
Pt's mother calling in response to missed call from RN, pt's mother unable to check vm's so she would like a call back when RN is available.

## 2023-01-26 NOTE — Telephone Encounter (Signed)
Results given to pts mother and she voiced understanding.

## 2023-01-27 DIAGNOSIS — R4189 Other symptoms and signs involving cognitive functions and awareness: Secondary | ICD-10-CM | POA: Diagnosis not present

## 2023-01-28 DIAGNOSIS — R4189 Other symptoms and signs involving cognitive functions and awareness: Secondary | ICD-10-CM | POA: Diagnosis not present

## 2023-01-29 DIAGNOSIS — R4189 Other symptoms and signs involving cognitive functions and awareness: Secondary | ICD-10-CM | POA: Diagnosis not present

## 2023-01-30 DIAGNOSIS — R4189 Other symptoms and signs involving cognitive functions and awareness: Secondary | ICD-10-CM | POA: Diagnosis not present

## 2023-01-31 ENCOUNTER — Telehealth: Payer: Self-pay

## 2023-01-31 DIAGNOSIS — R4189 Other symptoms and signs involving cognitive functions and awareness: Secondary | ICD-10-CM | POA: Diagnosis not present

## 2023-01-31 NOTE — Telephone Encounter (Addendum)
Pt mother is requesting a call back from nurse. Stated pt has been taking from medication in pill-pack from pharmacy.

## 2023-01-31 NOTE — Telephone Encounter (Signed)
Received VM from mother regarding patient's medications.   She reports that patient was given the wrong bubble pack for medication.   Called pharmacy to gather more information. Pharmacist reports that updated/correct bubble pack was sent out today.   Pharmacist reports that they will call patient family before the end of the day.   Called mother to provide with update. She reports that patient has been taking the incorrect medications for the last two weeks.   He has been taking the following medications.  Vitamin D- once weekly Olmesartan- 40 mg, 1 tablet daily.  Farxiga- 10 mg, 1 tablet daily. Atorvastatin 40 mg- 1 tablet daily Lubiprostone- 1 tablet daily Acycylovir Benzatropine  Will forward to PCP. Mother has also reached out to Neurologist regarding this, as patient has not received seizure medication in the last two weeks.   Veronda Prude, RN

## 2023-01-31 NOTE — Telephone Encounter (Signed)
Call back to patients mother and she states that they found out today that the pharmacy had given the patient the wrong bubble pack of medications and that's why his depakote levels were low. Mom stated that she would send a message via MyChart with what medications he was taking taking that weren't his. She also stated that she was going to reach out to his PCP and make aware. She wants to to make sure she should give him his depakote and iron once she gets them from the pharmacy today. Advised I will inform Dr. Teresa Coombs for advice and recommendations. I attempted to call [facility] to inquire about medications but line was busy.

## 2023-01-31 NOTE — Telephone Encounter (Signed)
No additional recommendation, let us make sure that Depakote is included in his pill pack.

## 2023-01-31 NOTE — Telephone Encounter (Signed)
Call to patients mom, advised no further action but to make sure patient starts depakote as soon as possible. Mother states she is waiting for medication to be delivered now. Appreciative of call.

## 2023-02-01 DIAGNOSIS — R4189 Other symptoms and signs involving cognitive functions and awareness: Secondary | ICD-10-CM | POA: Diagnosis not present

## 2023-02-01 NOTE — Telephone Encounter (Signed)
  Glad pharmacy has already sent the updated/correct bubble pack. No further action needed at this point   Maury Dus, MD PGY-3, [facility]

## 2023-02-02 DIAGNOSIS — R4189 Other symptoms and signs involving cognitive functions and awareness: Secondary | ICD-10-CM | POA: Diagnosis not present

## 2023-02-03 DIAGNOSIS — R4189 Other symptoms and signs involving cognitive functions and awareness: Secondary | ICD-10-CM | POA: Diagnosis not present

## 2023-02-03 DIAGNOSIS — I959 Hypotension, unspecified: Secondary | ICD-10-CM | POA: Diagnosis not present

## 2023-02-04 DIAGNOSIS — R4189 Other symptoms and signs involving cognitive functions and awareness: Secondary | ICD-10-CM | POA: Diagnosis not present

## 2023-02-05 DIAGNOSIS — R4189 Other symptoms and signs involving cognitive functions and awareness: Secondary | ICD-10-CM | POA: Diagnosis not present

## 2023-02-06 DIAGNOSIS — R4189 Other symptoms and signs involving cognitive functions and awareness: Secondary | ICD-10-CM | POA: Diagnosis not present

## 2023-02-07 DIAGNOSIS — R4189 Other symptoms and signs involving cognitive functions and awareness: Secondary | ICD-10-CM | POA: Diagnosis not present

## 2023-02-08 DIAGNOSIS — R4189 Other symptoms and signs involving cognitive functions and awareness: Secondary | ICD-10-CM | POA: Diagnosis not present

## 2023-02-09 ENCOUNTER — Emergency Department (HOSPITAL_BASED_OUTPATIENT_CLINIC_OR_DEPARTMENT_OTHER): Payer: Medicaid Other

## 2023-02-09 ENCOUNTER — Emergency Department (HOSPITAL_BASED_OUTPATIENT_CLINIC_OR_DEPARTMENT_OTHER)
Admission: EM | Admit: 2023-02-09 | Discharge: 2023-02-09 | Disposition: A | Discharge status: Home / Self Care | Payer: Medicaid Other | Attending: Emergency Medicine | Admitting: Emergency Medicine

## 2023-02-09 ENCOUNTER — Other Ambulatory Visit: Payer: Self-pay

## 2023-02-09 ENCOUNTER — Encounter (HOSPITAL_BASED_OUTPATIENT_CLINIC_OR_DEPARTMENT_OTHER): Payer: Self-pay | Admitting: Emergency Medicine

## 2023-02-09 DIAGNOSIS — Z79899 Other long term (current) drug therapy: Secondary | ICD-10-CM | POA: Insufficient documentation

## 2023-02-09 DIAGNOSIS — R600 Localized edema: Secondary | ICD-10-CM | POA: Diagnosis present

## 2023-02-09 DIAGNOSIS — M7989 Other specified soft tissue disorders: Secondary | ICD-10-CM | POA: Insufficient documentation

## 2023-02-09 DIAGNOSIS — R4189 Other symptoms and signs involving cognitive functions and awareness: Secondary | ICD-10-CM | POA: Diagnosis not present

## 2023-02-09 DIAGNOSIS — I10 Essential (primary) hypertension: Secondary | ICD-10-CM | POA: Insufficient documentation

## 2023-02-09 NOTE — Discharge Instructions (Signed)
As we discussed, your workup in the ER today was reassuring for acute findings.  Ultrasound imaging of your legs did not reveal any blood clots.  You have refused blood work for further evaluation for other potential causes of the swelling.  I recommend that you buy compression stockings that you can get over-the-counter at your local pharmacy and wear them at all times.  I also recommend that you keep your legs elevated is much as possible.  It is also important that you maintain a low-salt diet as this can contribute to swelling as well.  Follow-up with your primary care doctor at your earliest convenience.  Return if development of any new or worsening symptoms.

## 2023-02-09 NOTE — ED Triage Notes (Signed)
Left leg swelling since yesterday. Mild-pitting.

## 2023-02-09 NOTE — ED Notes (Signed)
Pt. Is from [facility] for alcoholism. Pt. Reports he has been having leg swelling since he has been there.  Pt. Reports he had a stroke a year ago July and since the stroke he began drinking Alcohol.  Pt. Is now trying to get help for the alcoholism..  Pt. Has edema in lower extremities bilat.

## 2023-02-09 NOTE — ED Provider Notes (Signed)
[place] [facility] Provider Note   CSN: [id number] Arrival date & time: 02/09/23  1141     History  Chief Complaint  Patient presents with   Leg Swelling    LEFT    Timothy Hubbard is a 32 y.o. male.  Patient with history of alcohol abuse, stroke, hypertension, seizures presents today with complaints of bilateral leg swelling. He states he first noticed same when he woke up this morning. He denies any history of similar symptoms previously. He states that he feels tightness in his legs but no overt pain. Denies fevers, chills, chest pain, shortness of breath, abdominal pain, nausea, vomiting, diarrhea.  Denies any decreased urination or dysuria.  He states he is currently in rehab for alcohol dependence and has not been drinking for the last few weeks. Denies any upper extremity swelling or abdominal distention. He is currently taking amlodipine and lisinopril, states that he has been on this medication for greater than a year. Denies recent travel, recent surgeries, history of blood clots, history of malignancy. Denies IVDU. Does state that he has been eating more salty foods recently and has been on his feet more often.   The history is provided by the patient. No language interpreter was used.       Home Medications Prior to Admission medications   Medication Sig Start Date End Date Taking? Authorizing Provider  acetaminophen (TYLENOL) 325 MG tablet Take 2 tablets (650 mg total) by mouth every 6 (six) hours as needed for mild pain or moderate pain. 03/30/22   Littie Deeds, MD  albuterol (PROVENTIL) (2.5 MG/3ML) 0.083% nebulizer solution USE (2.5 MG TOTAL) BY NEBULIZATION EVERY 6 (SIX) HOURS AS NEEDED FOR WHEEZING OR SHORTNESS OF BREATH. 10/31/22   Maury Dus, MD  amLODipine (NORVASC) 10 MG tablet TAKE 1 TABLET (10 MG TOTAL) BY MOUTH DAILY (AM) 12/23/22   Maury Dus, MD  divalproex (DEPAKOTE) 500 MG DR tablet Take 1 tablet (500 mg total) by  mouth 2 (two) times daily. 01/24/23   Windell Norfolk, MD  ferrous sulfate 324 (65 Fe) MG TBEC Take 1 tablet (325 mg total) by mouth daily at 12 noon. 12/23/22   Maury Dus, MD  fluticasone (FLONASE) 50 MCG/ACT nasal spray Place 2 sprays into both nostrils daily. 07/14/22   Fayette Pho, MD  folic acid (FOLVITE) 1 MG tablet TAKE 1 TABLET (1 MG TOTAL) BY MOUTH DAILY (AM) 12/23/22   Maury Dus, MD  hydrocortisone (ANUSOL-HC) 2.5 % rectal cream Place 1 Application rectally 2 (two) times daily. 12/05/22   Maury Dus, MD  lisinopril (ZESTRIL) 5 MG tablet TAKE 1 TABLET (5 MG TOTAL) BY MOUTH DAILY (AM) 12/23/22   Maury Dus, MD  magnesium oxide (MAG-OX) 400 (240 Mg) MG tablet Take 1 tablet by mouth 2 (two) times daily. 03/30/22   [provider]  mometasone-formoterol (DULERA) 100-5 MCG/ACT AERO INHALE TWO PUFFS INTO THE LUNGS TWICE A DAY 12/23/22   Maury Dus, MD  Multiple Vitamin (MULTIVITAMIN WITH MINERALS) TABS tablet Take 1 tablet by mouth daily. 03/11/22   Pokhrel, Rebekah [place], MD  naltrexone (DEPADE) 50 MG tablet TAKE 1/2 TABLET (25 MG TOTAL) BY MOUTH DAILY (AM) 12/23/22   Maury Dus, MD  nicotine (NICODERM CQ - DOSED IN MG/24 HOURS) 14 mg/24hr patch PLACE 1 PATCH (14 MG TOTAL) ONTO THE SKIN DAILY. 10/31/22   Maury Dus, MD  OLANZapine (ZYPREXA) 5 MG tablet Take 1 tablet (5 mg total) by mouth at bedtime. Future refills will  need to come from psychiatry. 12/23/22   Maury Dus, MD  pantoprazole (PROTONIX) 40 MG tablet TAKE 1 TABLET (40 MG TOTAL) BY MOUTH DAILY (AM) 12/23/22   Maury Dus, MD  polyethylene glycol (MIRALAX / GLYCOLAX) 17 g packet MIX AND DRINK BY MOUTH DAILY AS NEEDED FOR MODERATE CONSTIPATION 07/15/22   Maury Dus, MD  VENTOLIN HFA 108 (90 Base) MCG/ACT inhaler Inhale into the lungs. 04/15/22   [provider]      Allergies    Egg solids, whole; Ibuprofen; and Milk (cow)    Review of Systems   Review of Systems  All other systems  reviewed and are negative.   Physical Exam Updated Vital Signs BP 116/83 (BP Location: Left Arm)   Pulse (!) 112   Temp 98.1 F (36.7 C) (Oral)   Resp 16   Ht 5\' 6"  (1.676 m)   Wt 65.8 kg   SpO2 100%   BMI 23.40 kg/m  Physical Exam Vitals and nursing note reviewed.  Constitutional:      General: He is not in acute distress.    Appearance: Normal appearance. He is normal weight. He is not ill-appearing, toxic-appearing or diaphoretic.  HENT:     Head: Normocephalic and atraumatic.  Cardiovascular:     Rate and Rhythm: Normal rate and regular rhythm.     Heart sounds: Normal heart sounds.  Pulmonary:     Effort: Pulmonary effort is normal. No respiratory distress.     Breath sounds: Normal breath sounds.  Abdominal:     General: Abdomen is flat.     Palpations: Abdomen is soft.     Tenderness: There is no abdominal tenderness.  Musculoskeletal:        General: Normal range of motion.     Cervical back: Normal range of motion.     Comments: 2+ pitting edema in the right lower extremity up to the right calf. 3+ pitting edema in the left lower extremity up to the left calf. DP and PT pulses intact and 2+. ROM intact. No erythema or warmth. Mild TTP throughout. Ambulatory with steady gait.  Skin:    General: Skin is warm and dry.     Findings: No erythema.  Neurological:     General: No focal deficit present.     Mental Status: He is alert.  Psychiatric:        Mood and Affect: Mood normal.        Behavior: Behavior normal.     ED Results / Procedures / Treatments   Labs (all labs ordered are listed, but only abnormal results are displayed) Labs Reviewed - No data to display  EKG None  Radiology US Venous Img Lower Unilateral Left  Result Date: 02/09/2023 CLINICAL DATA:  Left lower extremity swelling for 1 day. EXAM: LEFT LOWER EXTREMITY VENOUS DOPPLER ULTRASOUND TECHNIQUE: Gray-scale sonography with compression, as well as color and duplex ultrasound, were  performed to evaluate the deep venous system(s) from the level of the common femoral vein through the popliteal and proximal calf veins. COMPARISON:  None Available. FINDINGS: VENOUS Normal compressibility of the common femoral, superficial femoral, and popliteal veins, as well as the visualized calf veins. Visualized portions of profunda femoral vein and great saphenous vein unremarkable. No filling defects to suggest DVT on grayscale or color Doppler imaging. Doppler waveforms show normal direction of venous flow, normal respiratory plasticity and response to augmentation. Limited views of the contralateral common femoral vein are unremarkable. OTHER Extensive edema throughout the  left calf. Prominent possibly reactive lymph nodes in the bilateral groin. Limitations: none IMPRESSION: 1. Negative for DVT in the left lower extremity. 2. Extensive edema throughout the left calf. 3. Prominent possibly reactive lymph nodes in the bilateral groin. Electronically Signed   By: Emmaline Kluver M.D.   On: 02/09/2023 12:36    Procedures Procedures    Medications Ordered in ED Medications - No data to display  ED Course/ Medical Decision Making/ A&P                             Medical Decision Making  Patient presents today with complaints of bilateral lower extremity swelling that started this morning.  He is afebrile, nontoxic-appearing, and in no acute distress with reassuring vital signs. Physical exam notes 2+ pitting edema in the right leg and 3+.  DVT ultrasound obtained which has resulted and reveals   1. Negative for DVT in the left lower extremity. 2. Extensive edema throughout the left calf. 3. Prominent possibly reactive lymph nodes in the bilateral groin.   I have personally reviewed and interpreted this imaging and agree with radiology interpretation.  Lung sounds CTA in all fields.  No abdominal pain or distention.  Chart reviewed, patient did have normal lab work on 5/15. I did recommend  repeat labs to check his kidney function given he is on lisinopril.  Patient refused lab work. Recommend compression stockings, elevation, low salt diet, and close follow-up as well as return precautions. No cellulitic changes. No other signs of acute heart, kidney, or liver failure. Symptoms could be related to his amlodipine, recommended continued evaluation and discussion of potential medication changes at his follow-up pcp appointment. Evaluation and diagnostic testing in the emergency department does not suggest an emergent condition requiring admission or immediate intervention beyond what has been performed at this time.  Plan for discharge with close PCP follow-up.  Patient is understanding and amenable with plan, educated on red flag symptoms that would prompt immediate return.  Patient discharged in stable condition.  Findings and plan of care discussed with supervising physician Dr. Anitra Lauth who is in agreement.    Final Clinical Impression(s) / ED Diagnoses Final diagnoses:  Bilateral lower extremity edema    Rx / DC Orders ED Discharge Orders     None     An After Visit Summary was printed and given to the patient.     Vear Clock 02/09/23 1553    Gwyneth Sprout, MD 02/13/23 [phone]

## 2023-02-09 NOTE — ED Notes (Signed)
Pt. Walked to room 7.  RN watched Pt. Walking down hall eating pickle potatoe chips and drinking gator aid.  Pt. In no distress.

## 2023-02-10 DIAGNOSIS — R4189 Other symptoms and signs involving cognitive functions and awareness: Secondary | ICD-10-CM | POA: Diagnosis not present

## 2023-02-11 DIAGNOSIS — R4189 Other symptoms and signs involving cognitive functions and awareness: Secondary | ICD-10-CM | POA: Diagnosis not present

## 2023-02-12 DIAGNOSIS — R4189 Other symptoms and signs involving cognitive functions and awareness: Secondary | ICD-10-CM | POA: Diagnosis not present

## 2023-02-13 ENCOUNTER — Encounter: Payer: Self-pay | Admitting: Family Medicine

## 2023-02-13 ENCOUNTER — Ambulatory Visit (INDEPENDENT_AMBULATORY_CARE_PROVIDER_SITE_OTHER): Payer: Medicaid Other | Admitting: Family Medicine

## 2023-02-13 VITALS — BP 125/80 | HR 99 | Temp 99.1°F | Wt 137.0 lb

## 2023-02-13 DIAGNOSIS — R600 Localized edema: Secondary | ICD-10-CM

## 2023-02-13 DIAGNOSIS — F1090 Alcohol use, unspecified, uncomplicated: Secondary | ICD-10-CM

## 2023-02-13 DIAGNOSIS — R4189 Other symptoms and signs involving cognitive functions and awareness: Secondary | ICD-10-CM | POA: Diagnosis not present

## 2023-02-13 NOTE — Patient Instructions (Addendum)
For your leg swelling: I have ordered blood work. I will send a letter with the results or call if abnormal. I have also ordered an ultrasound of your heart- someone will call you to schedule this.  For Substance Use: Start with [facility] again [place]:                                [phone]  Alcoholics Anonymous:      [phone]  Narcotics Anonymous:       [phone]  Quit Smoking Hotline:         800-QUIT-NOW 9514318529[facility]7185634176 Provides information on mental health, intellectual/developmental disabilities & substance abuse services in [facility]  Follow up in 2-3 weeks.  Take care, Dr Anner Crete

## 2023-02-13 NOTE — Progress Notes (Signed)
    SUBJECTIVE:   CHIEF COMPLAINT / HPI:   Leg Swelling Bilateral Present for 4-5 days No pain associated with it Was seen in ED for this on 6/6. Had DVT ultrasound which was negative. Refused lab work at that time. No fever, chest pain, shortness of breath, abdominal pain, urinary symptoms, or other complaints No recent travel, no recent surgery, no h/o malignancy Reports he spends decent portion of the day in bed therefore his feet are elevated  Medications Took someone else's meds for 2 weeks due to pharmacy sending incorrect bubble pack.  Restarted his own 1.5 weeks ago. Incorrect meds he was taking: vit D, olmesartan, farxiga, atorvastatin, lubiprostone, acyclovir, benztropine (see phone note from 01/31/23).  Alcohol Use Disorder Apparently was in [facility] inpatient rehab last month but left after 2 days per Mom & patient's report. Unclear reasons-- Mom states maybe it had something to do with him blowing 0.2 on breathalyzer (which she says was from her spraying perfume next to him). Per chart review and LCSW notes, may have been insurance issues. Currently drinking 32-40oz of beer daily. Smokes marijuana but no other substance use. They would really like to get him in inpatient rehab program.  PERTINENT  PMH / PSH: mood disorder, seizure  OBJECTIVE:   BP 125/80   Pulse 99   Temp 99.1 F (37.3 C)   Wt 137 lb (62.1 kg)   SpO2 99%   BMI 22.11 kg/m   Gen: NAD, able to participate in exam CV: RRR, normal S1/S2, no murmur Resp: Normal effort, lungs CTAB GI: abdomen soft, nontender, nondistended, no ascites Extremities: 1-2+ nonpitting edema bilateral lower extremities up to proximal shin Skin: warm and dry, no rashes noted Neuro: alert, no obvious focal deficits Psych: calm, cooperative, normal speech  ASSESSMENT/PLAN:   Bilateral lower extremity edema Acute bilateral lower extremity edema. Had negative DVT ultrasound already. Differential somewhat broad and includes  heart failure, venous insufficiency, anemia , cirrhosis, thyroid disease , and medication (was off amlodipine for few weeks and restarted at 10mg  which is possible culprit? Depoakote could also contribute). As such, the following tests were ordered: CBC, TSH, CMP, hepatitis panel, and Echocardiogram. Consider RUQ ultrasound for cirrhosis evaluation pending results of labs/echo.  Alcohol use disorder Continues to drink at least 32oz beer daily. Interested in inpatient rehab. Offered social work referral to discuss resources but Mom declined (did not have kind things to say about prior LCSW). Continue naltrexone 25mg  daily (recommend dose increase at future visit). Discussed contacting [facility] as well as [facility] for rehab options.  Medication Issue Took someone else's meds for 2 weeks. This has been resolved. No concerns from my standpoint about the meds he took.   Maury Dus, MD [facility]

## 2023-02-14 DIAGNOSIS — R4189 Other symptoms and signs involving cognitive functions and awareness: Secondary | ICD-10-CM | POA: Diagnosis not present

## 2023-02-14 DIAGNOSIS — R600 Localized edema: Secondary | ICD-10-CM | POA: Insufficient documentation

## 2023-02-14 LAB — COMPREHENSIVE METABOLIC PANEL
ALT: 11 IU/L (ref 0–44)
AST: 13 IU/L (ref 0–40)
Albumin/Globulin Ratio: 1.5
Albumin: 3.5 g/dL — ABNORMAL LOW (ref 4.1–5.1)
Alkaline Phosphatase: 65 IU/L (ref 44–121)
BUN/Creatinine Ratio: 13 (ref 9–20)
BUN: 7 mg/dL (ref 6–20)
Bilirubin Total: <0.2 mg/dL (ref 0.0–1.2)
CO2: 19 mmol/L — ABNORMAL LOW (ref 20–29)
Calcium: 8.4 mg/dL — ABNORMAL LOW (ref 8.7–10.2)
Chloride: 110 mmol/L — ABNORMAL HIGH (ref 96–106)
Creatinine, Ser: 0.53 mg/dL — ABNORMAL LOW (ref 0.76–1.27)
Globulin, Total: 2.3 g/dL (ref 1.5–4.5)
Glucose: 61 mg/dL — ABNORMAL LOW (ref 70–99)
Potassium: 4.2 mmol/L (ref 3.5–5.2)
Sodium: 144 mmol/L (ref 134–144)
Total Protein: 5.8 g/dL — ABNORMAL LOW (ref 6.0–8.5)
eGFR: 136 mL/min/{1.73_m2} (ref >59)

## 2023-02-14 LAB — CBC
Hematocrit: 27 % — ABNORMAL LOW (ref 37.5–51.0)
Hemoglobin: 8.9 g/dL — ABNORMAL LOW (ref 13.0–17.7)
MCH: 33.7 pg — ABNORMAL HIGH (ref 26.6–33.0)
MCHC: 33 g/dL (ref 31.5–35.7)
MCV: 102 fL — ABNORMAL HIGH (ref 79–97)
Platelets: 353 10*3/uL (ref 150–450)
RBC: 2.64 x10E6/uL — CL (ref 4.14–5.80)
RDW: 12.6 % (ref 11.6–15.4)
WBC: 7.3 10*3/uL (ref 3.4–10.8)

## 2023-02-14 LAB — ACUTE VIRAL HEPATITIS (HAV, HBV, HCV)
HCV Ab: NONREACTIVE
Hep A IgM: NEGATIVE
Hep B C IgM: NEGATIVE
Hepatitis B Surface Ag: NEGATIVE

## 2023-02-14 LAB — HCV INTERPRETATION

## 2023-02-14 LAB — T4F: T4,Free (Direct): 0.78 ng/dL — ABNORMAL LOW (ref 0.82–1.77)

## 2023-02-14 LAB — TSH RFX ON ABNORMAL TO FREE T4: TSH: 4.94 u[IU]/mL — ABNORMAL HIGH (ref 0.450–4.500)

## 2023-02-14 NOTE — Assessment & Plan Note (Addendum)
Acute bilateral lower extremity edema. Had negative DVT ultrasound already. Differential somewhat broad and includes heart failure, venous insufficiency, anemia , cirrhosis, thyroid disease , and medication (was off amlodipine for few weeks and restarted at 10mg  which is possible culprit? Depoakote could also contribute). As such, the following tests were ordered: CBC, TSH, CMP, hepatitis panel, and Echocardiogram. Consider RUQ ultrasound for cirrhosis evaluation pending results of labs/echo.

## 2023-02-14 NOTE — Assessment & Plan Note (Signed)
Continues to drink at least 32oz beer daily. Interested in inpatient rehab. Offered social work referral to discuss resources but Mom declined (did not have kind things to say about prior LCSW). Continue naltrexone 25mg  daily (recommend dose increase at future visit). Discussed contacting [facility] as well as [facility] for rehab options.

## 2023-02-15 DIAGNOSIS — R4189 Other symptoms and signs involving cognitive functions and awareness: Secondary | ICD-10-CM | POA: Diagnosis not present

## 2023-02-16 DIAGNOSIS — R4189 Other symptoms and signs involving cognitive functions and awareness: Secondary | ICD-10-CM | POA: Diagnosis not present

## 2023-02-17 DIAGNOSIS — R4189 Other symptoms and signs involving cognitive functions and awareness: Secondary | ICD-10-CM | POA: Diagnosis not present

## 2023-02-18 DIAGNOSIS — R4189 Other symptoms and signs involving cognitive functions and awareness: Secondary | ICD-10-CM | POA: Diagnosis not present

## 2023-02-19 DIAGNOSIS — R4189 Other symptoms and signs involving cognitive functions and awareness: Secondary | ICD-10-CM | POA: Diagnosis not present

## 2023-02-20 DIAGNOSIS — R4189 Other symptoms and signs involving cognitive functions and awareness: Secondary | ICD-10-CM | POA: Diagnosis not present

## 2023-02-21 DIAGNOSIS — R4189 Other symptoms and signs involving cognitive functions and awareness: Secondary | ICD-10-CM | POA: Diagnosis not present

## 2023-02-22 DIAGNOSIS — R4189 Other symptoms and signs involving cognitive functions and awareness: Secondary | ICD-10-CM | POA: Diagnosis not present

## 2023-02-23 DIAGNOSIS — R4189 Other symptoms and signs involving cognitive functions and awareness: Secondary | ICD-10-CM | POA: Diagnosis not present

## 2023-02-24 ENCOUNTER — Telehealth: Payer: Self-pay | Admitting: Family Medicine

## 2023-02-24 DIAGNOSIS — R4189 Other symptoms and signs involving cognitive functions and awareness: Secondary | ICD-10-CM | POA: Diagnosis not present

## 2023-02-24 NOTE — Telephone Encounter (Signed)
Spoke with patient's Mom regarding his lab results from recent visit.  Informed of low hemoglobin level-- advised to continue iron supplement once daily. Emphasized importance of compliance as well as reducing alcohol intake. We will recheck Hgb at next visit in 1 month. Offered to schedule follow-up but Mom declined, states she's busy right now and will call the office instead.   Maury Dus, MD PGY-3, [facility]

## 2023-02-25 DIAGNOSIS — R4189 Other symptoms and signs involving cognitive functions and awareness: Secondary | ICD-10-CM | POA: Diagnosis not present

## 2023-02-26 DIAGNOSIS — R4189 Other symptoms and signs involving cognitive functions and awareness: Secondary | ICD-10-CM | POA: Diagnosis not present

## 2023-02-27 DIAGNOSIS — R4189 Other symptoms and signs involving cognitive functions and awareness: Secondary | ICD-10-CM | POA: Diagnosis not present

## 2023-02-28 DIAGNOSIS — R4189 Other symptoms and signs involving cognitive functions and awareness: Secondary | ICD-10-CM | POA: Diagnosis not present

## 2023-03-01 DIAGNOSIS — R4189 Other symptoms and signs involving cognitive functions and awareness: Secondary | ICD-10-CM | POA: Diagnosis not present

## 2023-03-02 DIAGNOSIS — R4189 Other symptoms and signs involving cognitive functions and awareness: Secondary | ICD-10-CM | POA: Diagnosis not present

## 2023-03-03 ENCOUNTER — Ambulatory Visit (HOSPITAL_COMMUNITY): Payer: Medicaid Other | Attending: Family Medicine

## 2023-03-03 DIAGNOSIS — R4189 Other symptoms and signs involving cognitive functions and awareness: Secondary | ICD-10-CM | POA: Diagnosis not present

## 2023-03-06 ENCOUNTER — Other Ambulatory Visit: Payer: Self-pay | Admitting: Family Medicine

## 2023-04-04 ENCOUNTER — Encounter: Payer: Self-pay | Admitting: Family Medicine

## 2023-04-04 ENCOUNTER — Ambulatory Visit (INDEPENDENT_AMBULATORY_CARE_PROVIDER_SITE_OTHER): Payer: Medicaid Other | Admitting: Family Medicine

## 2023-04-04 VITALS — BP 106/77 | HR 122 | Ht 66.0 in | Wt 129.2 lb

## 2023-04-04 DIAGNOSIS — D649 Anemia, unspecified: Secondary | ICD-10-CM | POA: Diagnosis present

## 2023-04-04 DIAGNOSIS — I10 Essential (primary) hypertension: Secondary | ICD-10-CM | POA: Diagnosis not present

## 2023-04-04 DIAGNOSIS — F1090 Alcohol use, unspecified, uncomplicated: Secondary | ICD-10-CM | POA: Diagnosis not present

## 2023-04-04 DIAGNOSIS — K642 Third degree hemorrhoids: Secondary | ICD-10-CM

## 2023-04-04 DIAGNOSIS — F39 Unspecified mood [affective] disorder: Secondary | ICD-10-CM | POA: Diagnosis not present

## 2023-04-04 DIAGNOSIS — R569 Unspecified convulsions: Secondary | ICD-10-CM

## 2023-04-04 DIAGNOSIS — J45909 Unspecified asthma, uncomplicated: Secondary | ICD-10-CM | POA: Diagnosis not present

## 2023-04-04 DIAGNOSIS — R0982 Postnasal drip: Secondary | ICD-10-CM

## 2023-04-04 DIAGNOSIS — D509 Iron deficiency anemia, unspecified: Secondary | ICD-10-CM

## 2023-04-04 DIAGNOSIS — K219 Gastro-esophageal reflux disease without esophagitis: Secondary | ICD-10-CM

## 2023-04-04 DIAGNOSIS — F17200 Nicotine dependence, unspecified, uncomplicated: Secondary | ICD-10-CM | POA: Diagnosis not present

## 2023-04-04 DIAGNOSIS — K5900 Constipation, unspecified: Secondary | ICD-10-CM

## 2023-04-04 MED ORDER — POLYETHYLENE GLYCOL 3350 17 G PO PACK
PACK | ORAL | 0 refills | Status: AC
Start: 2023-04-04 — End: ?

## 2023-04-04 MED ORDER — FLUTICASONE PROPIONATE 50 MCG/ACT NA SUSP
2.0000 | Freq: Every day | NASAL | 6 refills | Status: DC
Start: 2023-04-04 — End: 2023-06-12

## 2023-04-04 MED ORDER — DULERA 100-5 MCG/ACT IN AERO: INHALATION_SPRAY | RESPIRATORY_TRACT | 0 refills | Status: DC

## 2023-04-04 MED ORDER — AMLODIPINE BESYLATE 10 MG PO TABS: ORAL_TABLET | ORAL | 0 refills | Status: DC

## 2023-04-04 MED ORDER — MAGNESIUM OXIDE -MG SUPPLEMENT 400 (240 MG) MG PO TABS: 1.0000 | ORAL_TABLET | Freq: Two times a day (BID) | ORAL | 3 refills | Status: DC

## 2023-04-04 MED ORDER — VENTOLIN HFA 108 (90 BASE) MCG/ACT IN AERS
1.0000 | INHALATION_SPRAY | Freq: Four times a day (QID) | RESPIRATORY_TRACT | 3 refills | Status: DC | PRN
Start: 2023-04-04 — End: 2023-08-08

## 2023-04-04 MED ORDER — NICOTINE 14 MG/24HR TD PT24
14.0000 mg | MEDICATED_PATCH | Freq: Every day | TRANSDERMAL | 2 refills | Status: DC
Start: 2023-04-04 — End: 2023-04-23

## 2023-04-04 MED ORDER — ADULT MULTIVITAMIN W/MINERALS CH: 1.0000 | ORAL_TABLET | Freq: Every day | ORAL | 3 refills | Status: DC

## 2023-04-04 MED ORDER — FERROUS SULFATE 324 (65 FE) MG PO TBEC
1.0000 | DELAYED_RELEASE_TABLET | Freq: Every day | ORAL | 0 refills | Status: DC
Start: 2023-04-04 — End: 2023-06-30

## 2023-04-04 MED ORDER — ADULT MULTIVITAMIN W/MINERALS CH: 1.0000 | ORAL_TABLET | Freq: Every day | ORAL | 3 refills | Status: AC

## 2023-04-04 MED ORDER — FOLIC ACID 1 MG PO TABS
ORAL_TABLET | ORAL | 0 refills | Status: DC
Start: 2023-04-04 — End: 2023-06-30

## 2023-04-04 MED ORDER — HYDROCORTISONE (PERIANAL) 2.5 % EX CREA: 1.0000 | TOPICAL_CREAM | Freq: Two times a day (BID) | CUTANEOUS | 0 refills | Status: DC

## 2023-04-04 MED ORDER — OLANZAPINE 5 MG PO TABS: 5.0000 mg | ORAL_TABLET | Freq: Every day | ORAL | 0 refills | Status: DC

## 2023-04-04 MED ORDER — ALBUTEROL SULFATE (2.5 MG/3ML) 0.083% IN NEBU
2.5000 mg | INHALATION_SOLUTION | Freq: Four times a day (QID) | RESPIRATORY_TRACT | 0 refills | Status: AC | PRN
Start: 2023-04-04 — End: ?

## 2023-04-04 MED ORDER — PANTOPRAZOLE SODIUM 40 MG PO TBEC
DELAYED_RELEASE_TABLET | ORAL | 0 refills | Status: DC
Start: 2023-04-04 — End: 2023-06-30

## 2023-04-04 MED ORDER — LISINOPRIL 5 MG PO TABS: ORAL_TABLET | ORAL | 0 refills | Status: DC

## 2023-04-04 MED ORDER — NALTREXONE HCL 50 MG PO TABS
ORAL_TABLET | ORAL | 0 refills | Status: DC
Start: 2023-04-04 — End: 2023-06-30

## 2023-04-04 MED ORDER — DIVALPROEX SODIUM 500 MG PO DR TAB: 500.0000 mg | DELAYED_RELEASE_TABLET | Freq: Two times a day (BID) | ORAL | 1 refills | Status: DC

## 2023-04-04 NOTE — Progress Notes (Signed)
SUBJECTIVE:   CHIEF COMPLAINT / HPI:   Anemia - started on iron supplementation last month. Hgb 8.9, MCV 102 on 02/13/23. Pt does use alcohol regularly - reports 1 beer per day. He takes folate and multivitamin as well.  Needs refills on all of his medications per caregiver  Denies current fatigue, pain, palpitations, shortness of breath  PERTINENT  PMH / PSH:   Past Medical History:  Diagnosis Date   Acute encephalopathy 03/10/2022   Adenomatous polyp of descending colon    Asthma    Encephalopathy acute 03/01/2022   Hematochezia    HTN (hypertension)    Hypokalemia 03/04/2022   Need for hepatitis C screening test 04/27/2022   Seizures (HCC)     OBJECTIVE:  BP 106/77   Pulse (!) 122   Ht 5\' 6"  (1.676 m)   Wt 129 lb 3.2 oz (58.6 kg)   SpO2 100%   BMI 20.85 kg/m   General: NAD, pleasant, able to participate in exam Cardiac: tachycardic, regular rhythm Respiratory: CTAB, normal WOB Abdomen: soft, non-tender, non-distended, normoactive bowel sounds Extremities: warm and well perfused, no edema or cyanosis Skin: warm and dry, no rashes noted Neuro: alert, no obvious focal deficits, speech normal Psych: Normal affect and mood  ASSESSMENT/PLAN:   Patient here for several medication refills as below as well as follow-up for anemia.  He has required blood transfusions in the past.  He is taking iron, folate, multivitamin.  He is tachycardic although he has been tachycardic at past visits.  Recheck CBC today, will inform caregiver if patient needs transfusion.  Otherwise continue current therapy.  The patient and his caregiver left the visit prior to receiving the AVS and wrap-up instructions.   Anemia, unspecified type -     CBC with Differential/Platelet  Seizure (HCC) -     Divalproex Sodium; Take 1 tablet (500 mg total) by mouth 2 (two) times daily.  Dispense: 180 tablet; Refill: 1  Iron deficiency anemia, unspecified iron deficiency anemia type -     Ferrous  Sulfate; Take 1 tablet (325 mg total) by mouth daily at 12 noon.  Dispense: 90 tablet; Refill: 0  Post-nasal drip -     Fluticasone Propionate; Place 2 sprays into both nostrils daily.  Dispense: 16 g; Refill: 6  Alcohol use disorder -     Folic Acid; TAKE 1 TABLET (1 MG TOTAL) BY MOUTH DAILY (AM)  Dispense: 90 tablet; Refill: 0 -     Naltrexone HCl; TAKE 1/2 TABLET (25 MG TOTAL) BY MOUTH DAILY (AM)  Dispense: 45 tablet; Refill: 0 -     multivitamin with minerals; Take 1 tablet by mouth daily.  Dispense: 30 tablet; Refill: 3  Uncomplicated asthma, unspecified asthma severity, unspecified whether persistent -     Albuterol Sulfate; Take 3 mLs (2.5 mg total) by nebulization every 6 (six) hours as needed for wheezing or shortness of breath.  Dispense: 75 mL; Refill: 0 -     Dulera; INHALE TWO PUFFS INTO THE LUNGS TWICE A DAY  Dispense: 13 g; Refill: 0 -     Ventolin HFA; Inhale 1-2 puffs into the lungs every 6 (six) hours as needed for wheezing or shortness of breath.  Dispense: 6.7 g; Refill: 3  Essential hypertension -     amLODIPine Besylate; TAKE 1 TABLET (10 MG TOTAL) BY MOUTH DAILY (AM)  Dispense: 90 tablet; Refill: 0 -     Lisinopril; TAKE 1 TABLET (5 MG TOTAL) BY MOUTH DAILY (AM)  Dispense:  90 tablet; Refill: 0  Grade III internal hemorrhoids -     Hydrocortisone (Perianal); Place 1 Application rectally 2 (two) times daily.  Dispense: 30 g; Refill: 0  Constipation, unspecified constipation type -     Magnesium Oxide -Mg Supplement; Take 1 tablet (400 mg total) by mouth 2 (two) times daily.  Dispense: 30 tablet; Refill: 3 -     Polyethylene Glycol 3350; MIX AND DRINK BY MOUTH DAILY AS NEEDED FOR MODERATE CONSTIPATION  Dispense: 30 each; Refill: 0  TOBACCO USER -     Nicotine; Place 1 patch (14 mg total) onto the skin daily.  Dispense: 28 patch; Refill: 2  Mood disorder (HCC) -     OLANZapine; Take 1 tablet (5 mg total) by mouth at bedtime. Future refills will need to come from  psychiatry.  Dispense: 90 tablet; Refill: 0  Gastroesophageal reflux disease, unspecified whether esophagitis present -     Pantoprazole Sodium; TAKE 1 TABLET (40 MG TOTAL) BY MOUTH DAILY (AM)  Dispense: 90 tablet; Refill: 0   Meds ordered this encounter  Medications   albuterol (PROVENTIL) (2.5 MG/3ML) 0.083% nebulizer solution    Sig: Take 3 mLs (2.5 mg total) by nebulization every 6 (six) hours as needed for wheezing or shortness of breath.    Dispense:  75 mL    Refill:  0   amLODipine (NORVASC) 10 MG tablet    Sig: TAKE 1 TABLET (10 MG TOTAL) BY MOUTH DAILY (AM)    Dispense:  90 tablet    Refill:  0   divalproex (DEPAKOTE) 500 MG DR tablet    Sig: Take 1 tablet (500 mg total) by mouth 2 (two) times daily.    Dispense:  180 tablet    Refill:  1   ferrous sulfate 324 (65 Fe) MG TBEC    Sig: Take 1 tablet (325 mg total) by mouth daily at 12 noon.    Dispense:  90 tablet    Refill:  0   fluticasone (FLONASE) 50 MCG/ACT nasal spray    Sig: Place 2 sprays into both nostrils daily.    Dispense:  16 g    Refill:  6   folic acid (FOLVITE) 1 MG tablet    Sig: TAKE 1 TABLET (1 MG TOTAL) BY MOUTH DAILY (AM)    Dispense:  90 tablet    Refill:  0   hydrocortisone (ANUSOL-HC) 2.5 % rectal cream    Sig: Place 1 Application rectally 2 (two) times daily.    Dispense:  30 g    Refill:  0   lisinopril (ZESTRIL) 5 MG tablet    Sig: TAKE 1 TABLET (5 MG TOTAL) BY MOUTH DAILY (AM)    Dispense:  90 tablet    Refill:  0   magnesium oxide (MAG-OX) 400 (240 Mg) MG tablet    Sig: Take 1 tablet (400 mg total) by mouth 2 (two) times daily.    Dispense:  30 tablet    Refill:  3   mometasone-formoterol (DULERA) 100-5 MCG/ACT AERO    Sig: INHALE TWO PUFFS INTO THE LUNGS TWICE A DAY    Dispense:  13 g    Refill:  0   DISCONTD: Multiple Vitamin (MULTIVITAMIN WITH MINERALS) TABS tablet    Sig: Take 1 tablet by mouth daily.    Dispense:  30 tablet    Refill:  3   naltrexone (DEPADE) 50 MG tablet     Sig: TAKE 1/2 TABLET (25 MG TOTAL) BY MOUTH  DAILY (AM)    Dispense:  45 tablet    Refill:  0   nicotine (NICODERM CQ - DOSED IN MG/24 HOURS) 14 mg/24hr patch    Sig: Place 1 patch (14 mg total) onto the skin daily.    Dispense:  28 patch    Refill:  2   OLANZapine (ZYPREXA) 5 MG tablet    Sig: Take 1 tablet (5 mg total) by mouth at bedtime. Future refills will need to come from psychiatry.    Dispense:  90 tablet    Refill:  0   pantoprazole (PROTONIX) 40 MG tablet    Sig: TAKE 1 TABLET (40 MG TOTAL) BY MOUTH DAILY (AM)    Dispense:  90 tablet    Refill:  0   polyethylene glycol (MIRALAX / GLYCOLAX) 17 g packet    Sig: MIX AND DRINK BY MOUTH DAILY AS NEEDED FOR MODERATE CONSTIPATION    Dispense:  30 each    Refill:  0   VENTOLIN HFA 108 (90 Base) MCG/ACT inhaler    Sig: Inhale 1-2 puffs into the lungs every 6 (six) hours as needed for wheezing or shortness of breath.    Dispense:  6.7 g    Refill:  3   Multiple Vitamin (MULTIVITAMIN WITH MINERALS) TABS tablet    Sig: Take 1 tablet by mouth daily.    Dispense:  30 tablet    Refill:  3   Return in about 4 weeks (around 05/02/2023), or if symptoms worsen or fail to improve.  Vonna Drafts, MD [facility]

## 2023-04-05 ENCOUNTER — Encounter: Payer: Self-pay | Admitting: Family Medicine

## 2023-04-06 ENCOUNTER — Telehealth: Payer: Self-pay

## 2023-04-06 NOTE — Telephone Encounter (Signed)
Patient's mother calls nurse line regarding lab results. Mother verified patient name and DOB. Advised of normal results per Dr. Barb Merino and to continue iron and vitamin supplementation.   Mother reports concerns for patient continuing to look pale. She states that he has looked pale for approx 3 weeks.   She is asking to speak with provider further regarding reasons for continued paleness.   Please return call to mother at [phone].  Thanks   Veronda Prude, RN

## 2023-04-22 ENCOUNTER — Other Ambulatory Visit: Payer: Self-pay

## 2023-04-22 ENCOUNTER — Observation Stay (HOSPITAL_COMMUNITY)
Admission: EM | Admit: 2023-04-22 | Discharge: 2023-04-23 | Disposition: A | Discharge status: Home / Self Care | Payer: MEDICAID | Attending: Internal Medicine | Admitting: Internal Medicine

## 2023-04-22 ENCOUNTER — Encounter (HOSPITAL_COMMUNITY): Payer: Self-pay

## 2023-04-22 DIAGNOSIS — R569 Unspecified convulsions: Secondary | ICD-10-CM | POA: Diagnosis not present

## 2023-04-22 DIAGNOSIS — I10 Essential (primary) hypertension: Secondary | ICD-10-CM | POA: Diagnosis not present

## 2023-04-22 DIAGNOSIS — K1370 Unspecified lesions of oral mucosa: Secondary | ICD-10-CM

## 2023-04-22 DIAGNOSIS — F17210 Nicotine dependence, cigarettes, uncomplicated: Secondary | ICD-10-CM | POA: Insufficient documentation

## 2023-04-22 DIAGNOSIS — J45909 Unspecified asthma, uncomplicated: Secondary | ICD-10-CM | POA: Insufficient documentation

## 2023-04-22 DIAGNOSIS — F39 Unspecified mood [affective] disorder: Secondary | ICD-10-CM

## 2023-04-22 DIAGNOSIS — R9431 Abnormal electrocardiogram [ECG] [EKG]: Secondary | ICD-10-CM | POA: Diagnosis present

## 2023-04-22 DIAGNOSIS — G40909 Epilepsy, unspecified, not intractable, without status epilepticus: Secondary | ICD-10-CM

## 2023-04-22 DIAGNOSIS — Z79899 Other long term (current) drug therapy: Secondary | ICD-10-CM | POA: Diagnosis not present

## 2023-04-22 DIAGNOSIS — E876 Hypokalemia: Secondary | ICD-10-CM | POA: Diagnosis not present

## 2023-04-22 DIAGNOSIS — J029 Acute pharyngitis, unspecified: Secondary | ICD-10-CM | POA: Diagnosis present

## 2023-04-22 DIAGNOSIS — B370 Candidal stomatitis: Secondary | ICD-10-CM

## 2023-04-22 LAB — RAPID HIV SCREEN (HIV 1/2 AB+AG)
HIV 1/2 Antibodies: NONREACTIVE
HIV-1 P24 Antigen - HIV24: NONREACTIVE

## 2023-04-22 LAB — CBC WITH DIFFERENTIAL/PLATELET
Abs Immature Granulocytes: 0.03 10*3/uL (ref 0.00–0.07)
Basophils Absolute: 0 10*3/uL (ref 0.0–0.1)
Basophils Relative: 0 %
Eosinophils Absolute: 0 10*3/uL (ref 0.0–0.5)
Eosinophils Relative: 0 %
HCT: 38.5 % — ABNORMAL LOW (ref 39.0–52.0)
Hemoglobin: 13.1 g/dL (ref 13.0–17.0)
Immature Granulocytes: 0 %
Lymphocytes Relative: 26 %
Lymphs Abs: 2 10*3/uL (ref 0.7–4.0)
MCH: 32.4 pg (ref 26.0–34.0)
MCHC: 34 g/dL (ref 30.0–36.0)
MCV: 95.3 fL (ref 80.0–100.0)
Monocytes Absolute: 0.9 10*3/uL (ref 0.1–1.0)
Monocytes Relative: 11 %
Neutro Abs: 4.8 10*3/uL (ref 1.7–7.7)
Neutrophils Relative %: 63 %
Platelets: 366 10*3/uL (ref 150–400)
RBC: 4.04 MIL/uL — ABNORMAL LOW (ref 4.22–5.81)
RDW: 11.7 % (ref 11.5–15.5)
WBC: 7.7 10*3/uL (ref 4.0–10.5)
nRBC: 0 % (ref 0.0–0.2)

## 2023-04-22 LAB — COMPREHENSIVE METABOLIC PANEL
ALT: 11 U/L (ref 0–44)
AST: 9 U/L — ABNORMAL LOW (ref 15–41)
Albumin: 2.2 g/dL — ABNORMAL LOW (ref 3.5–5.0)
Alkaline Phosphatase: 28 U/L — ABNORMAL LOW (ref 38–126)
Anion gap: 13 (ref 5–15)
BUN: 11 mg/dL (ref 6–20)
CO2: 13 mmol/L — ABNORMAL LOW (ref 22–32)
Calcium: 6 mg/dL — CL (ref 8.9–10.3)
Chloride: 111 mmol/L (ref 98–111)
Creatinine, Ser: 0.6 mg/dL — ABNORMAL LOW (ref 0.61–1.24)
GFR, Estimated: >60 mL/min (ref >60)
Glucose, Bld: 51 mg/dL — ABNORMAL LOW (ref 70–99)
Potassium: 2 mmol/L — CL (ref 3.5–5.1)
Sodium: 137 mmol/L (ref 135–145)
Total Bilirubin: 0.8 mg/dL (ref 0.3–1.2)
Total Protein: 4.3 g/dL — ABNORMAL LOW (ref 6.5–8.1)

## 2023-04-22 LAB — CBG MONITORING, ED: Glucose-Capillary: 79 mg/dL (ref 70–99)

## 2023-04-22 LAB — POTASSIUM: Potassium: 2.7 mmol/L — CL (ref 3.5–5.1)

## 2023-04-22 LAB — MAGNESIUM: Magnesium: 1.8 mg/dL (ref 1.7–2.4)

## 2023-04-22 LAB — GROUP A STREP BY PCR: Group A Strep by PCR: NOT DETECTED

## 2023-04-22 MED ORDER — POLYETHYLENE GLYCOL 3350 17 G PO PACK: 17.0000 g | PACK | Freq: Every day | ORAL | Status: DC | PRN

## 2023-04-22 MED ORDER — ALBUTEROL SULFATE (2.5 MG/3ML) 0.083% IN NEBU: 2.5000 mg | INHALATION_SOLUTION | Freq: Four times a day (QID) | RESPIRATORY_TRACT | Status: DC | PRN

## 2023-04-22 MED ORDER — POTASSIUM CHLORIDE 10 MEQ/100ML IV SOLN
10.0000 meq | INTRAVENOUS | Status: AC
  Administered 2023-04-22 (×5): 10 meq via INTRAVENOUS
  Filled 2023-04-22 (×6): qty 100

## 2023-04-22 MED ORDER — HYDROCODONE-ACETAMINOPHEN 7.5-325 MG/15ML PO SOLN
10.0000 mL | Freq: Once | ORAL | Status: AC
  Administered 2023-04-22: 10 mL via ORAL
  Filled 2023-04-22: qty 15

## 2023-04-22 MED ORDER — MAGNESIUM SULFATE IN D5W 1-5 GM/100ML-% IV SOLN
1.0000 g | Freq: Once | INTRAVENOUS | Status: AC
  Administered 2023-04-22: 1 g via INTRAVENOUS
  Filled 2023-04-22: qty 100

## 2023-04-22 MED ORDER — FLUCONAZOLE 200 MG PO TABS: 400.0000 mg | ORAL_TABLET | Freq: Once | ORAL | Status: DC

## 2023-04-22 MED ORDER — CALCIUM GLUCONATE-NACL 1-0.675 GM/50ML-% IV SOLN
1.0000 g | Freq: Once | INTRAVENOUS | Status: AC
  Administered 2023-04-22: 1000 mg via INTRAVENOUS
  Filled 2023-04-22: qty 50

## 2023-04-22 MED ORDER — PANTOPRAZOLE SODIUM 40 MG PO TBEC
40.0000 mg | DELAYED_RELEASE_TABLET | Freq: Every day | ORAL | Status: DC
  Filled 2023-04-22: qty 1

## 2023-04-22 MED ORDER — LISINOPRIL 5 MG PO TABS
5.0000 mg | ORAL_TABLET | Freq: Every day | ORAL | Status: DC
  Filled 2023-04-22: qty 1

## 2023-04-22 MED ORDER — ACETAMINOPHEN 325 MG PO TABS
650.0000 mg | ORAL_TABLET | Freq: Four times a day (QID) | ORAL | Status: DC | PRN
  Filled 2023-04-22: qty 2

## 2023-04-22 MED ORDER — OLANZAPINE 5 MG PO TABS
5.0000 mg | ORAL_TABLET | Freq: Every day | ORAL | Status: DC
  Filled 2023-04-22: qty 1

## 2023-04-22 MED ORDER — ENOXAPARIN SODIUM 40 MG/0.4ML IJ SOSY
40.0000 mg | PREFILLED_SYRINGE | INTRAMUSCULAR | Status: DC
  Administered 2023-04-22: 40 mg via SUBCUTANEOUS
  Filled 2023-04-22: qty 0.4

## 2023-04-22 MED ORDER — OXYCODONE HCL 5 MG PO TABS
5.0000 mg | ORAL_TABLET | ORAL | Status: DC | PRN
  Administered 2023-04-22 – 2023-04-23 (×3): 5 mg via ORAL
  Filled 2023-04-22 (×3): qty 1

## 2023-04-22 MED ORDER — POTASSIUM CHLORIDE 20 MEQ PO PACK
40.0000 meq | PACK | Freq: Once | ORAL | Status: AC
  Administered 2023-04-22: 40 meq via ORAL
  Filled 2023-04-22: qty 2

## 2023-04-22 MED ORDER — FLUCONAZOLE 100 MG PO TABS
200.0000 mg | ORAL_TABLET | Freq: Every day | ORAL | Status: DC
  Administered 2023-04-23: 200 mg via ORAL
  Filled 2023-04-22: qty 2

## 2023-04-22 MED ORDER — SODIUM CHLORIDE 0.9% FLUSH
3.0000 mL | Freq: Two times a day (BID) | INTRAVENOUS | Status: DC
  Administered 2023-04-23: 3 mL via INTRAVENOUS

## 2023-04-22 MED ORDER — AMLODIPINE BESYLATE 10 MG PO TABS
10.0000 mg | ORAL_TABLET | Freq: Every day | ORAL | Status: DC
  Filled 2023-04-22: qty 1

## 2023-04-22 MED ORDER — ACETAMINOPHEN 650 MG RE SUPP: 650.0000 mg | Freq: Four times a day (QID) | RECTAL | Status: DC | PRN

## 2023-04-22 MED ORDER — DIVALPROEX SODIUM 250 MG PO DR TAB
500.0000 mg | DELAYED_RELEASE_TABLET | Freq: Two times a day (BID) | ORAL | Status: DC
  Administered 2023-04-23: 500 mg via ORAL
  Filled 2023-04-22: qty 1
  Filled 2023-04-22: qty 2

## 2023-04-22 MED ORDER — MOMETASONE FURO-FORMOTEROL FUM 100-5 MCG/ACT IN AERO
2.0000 | INHALATION_SPRAY | Freq: Two times a day (BID) | RESPIRATORY_TRACT | Status: DC
  Filled 2023-04-22: qty 8.8

## 2023-04-22 MED ORDER — FLUCONAZOLE 200 MG PO TABS
200.0000 mg | ORAL_TABLET | Freq: Once | ORAL | Status: AC
  Administered 2023-04-22: 200 mg via ORAL
  Filled 2023-04-22: qty 1

## 2023-04-22 MED ORDER — LIDOCAINE VISCOUS HCL 2 % MT SOLN
15.0000 mL | OROMUCOSAL | Status: DC | PRN
  Administered 2023-04-22: 15 mL via OROMUCOSAL
  Filled 2023-04-22: qty 15

## 2023-04-22 MED ORDER — LIDOCAINE VISCOUS HCL 2 % MT SOLN
15.0000 mL | OROMUCOSAL | Status: DC | PRN
  Administered 2023-04-23 (×2): 15 mL via OROMUCOSAL
  Filled 2023-04-22 (×4): qty 15

## 2023-04-22 MED ORDER — SODIUM CHLORIDE 0.9 % IV BOLUS
1000.0000 mL | Freq: Once | INTRAVENOUS | Status: AC
  Administered 2023-04-22: 1000 mL via INTRAVENOUS

## 2023-04-22 NOTE — Progress Notes (Signed)
Pt arrived to 6E A&O, VSS. Pain medicine and IV and PO potassium administered. Pt placed on telemetry. Educated to use call light and left within reach.

## 2023-04-22 NOTE — ED Provider Notes (Signed)
Accepted handoff at shift change from [facility], [place]. Please see prior provider note for more detail.   Briefly: Patient is 32 y.o. presents to the ED for evaluation of sores in the mouth.  His symptoms started spontaneously about 7 days ago.  No nasal congestion, rhinorrhea.  Oral intake has been very difficult due to severe pain.  No difficulty breathing or voice changes.  Denies HIV.    DDX: concern for oral candidiasis  Plan: follow up on repeat labs, give IV potassium, hydration, and admit to hospital service     Results for orders placed or performed during the hospital encounter of 04/22/23  CBC with Differential  Result Value Ref Range   WBC 7.7 4.0 - 10.5 K/uL   RBC 4.04 (L) 4.22 - 5.81 MIL/uL   Hemoglobin 13.1 13.0 - 17.0 g/dL   HCT 16.1 (L) 09.6 - 04.5 %   MCV 95.3 80.0 - 100.0 fL   MCH 32.4 26.0 - 34.0 pg   MCHC 34.0 30.0 - 36.0 g/dL   RDW 40.9 81.1 - 91.4 %   Platelets 366 150 - 400 K/uL   nRBC 0.0 0.0 - 0.2 %   Neutrophils Relative % 63 %   Neutro Abs 4.8 1.7 - 7.7 K/uL   Lymphocytes Relative 26 %   Lymphs Abs 2.0 0.7 - 4.0 K/uL   Monocytes Relative 11 %   Monocytes Absolute 0.9 0.1 - 1.0 K/uL   Eosinophils Relative 0 %   Eosinophils Absolute 0.0 0.0 - 0.5 K/uL   Basophils Relative 0 %   Basophils Absolute 0.0 0.0 - 0.1 K/uL   Immature Granulocytes 0 %   Abs Immature Granulocytes 0.03 0.00 - 0.07 K/uL  Rapid HIV screen (HIV 1/2 Ab+Ag)  Result Value Ref Range   HIV-1 P24 Antigen - HIV24 NON REACTIVE NON REACTIVE   HIV 1/2 Antibodies NON REACTIVE NON REACTIVE   Interpretation (HIV Ag Ab)      A non reactive test result means that HIV 1 or HIV 2 antibodies and HIV 1 p24 antigen were not detected in the specimen.  Comprehensive metabolic panel  Result Value Ref Range   Sodium 137 135 - 145 mmol/L   Potassium 2.0 (LL) 3.5 - 5.1 mmol/L   Chloride 111 98 - 111 mmol/L   CO2 13 (L) 22 - 32 mmol/L   Glucose, Bld 51 (L) 70 - 99 mg/dL   BUN 11 6 - 20 mg/dL    Creatinine, Ser 7.82 (L) 0.61 - 1.24 mg/dL   Calcium 6.0 (LL) 8.9 - 10.3 mg/dL   Total Protein 4.3 (L) 6.5 - 8.1 g/dL   Albumin 2.2 (L) 3.5 - 5.0 g/dL   AST 9 (L) 15 - 41 U/L   ALT 11 0 - 44 U/L   Alkaline Phosphatase 28 (L) 38 - 126 U/L   Total Bilirubin 0.8 0.3 - 1.2 mg/dL   GFR, Estimated >95 >62 mL/min   Anion gap 13 5 - 15  CBG monitoring, ED  Result Value Ref Range   Glucose-Capillary 79 70 - 99 mg/dL   No results found.  Physical Exam  BP (!) 127/98 (BP Location: Left Arm)   Pulse 88   Temp 98.6 F (37 C) (Oral)   Resp 14   SpO2 100%   Physical Exam Vitals and nursing note reviewed.  Constitutional:      General: He is not in acute distress.    Appearance: Normal appearance. He is not ill-appearing or diaphoretic.  Cardiovascular:     Rate and Rhythm: Normal rate and regular rhythm.  Pulmonary:     Effort: Pulmonary effort is normal.  Skin:    General: Skin is warm and dry.     Capillary Refill: Capillary refill takes less than 2 seconds.  Neurological:     Mental Status: He is alert. Mental status is at baseline.  Psychiatric:        Mood and Affect: Mood normal.        Behavior: Behavior normal.     Procedures  Procedures  ED Course / MDM    Medical Decision Making Amount and/or Complexity of Data Reviewed Labs: ordered.  Risk Prescription drug management.   4:31 PM  Follow up potassium still low at 2.7.  Magnesium normal.  ECG does have QT prolongation with borderline T wave abnormalities.  Concern about hypocalcemia as well.   I requested consultation with family medicine practice and spoke with resident who agreed with hospital admission and treating hypocalcemia, however, patient will need to be admitted by [facility] as they are unable to admit patients at [facility].    Patient given IV calcium for hypocalcemia due to having ECG changes.    5:27 PM  Strep is negative.  Discussed patient case with on-call hospitalist Dr. Antionette Char who agrees with hospital  admission.    Patient to be admitted to [facility] for electrolyte replenishment and oral lesions.       Lenard Simmer, PA-C 04/22/23 1729    Wynetta Fines, MD 04/22/23 [phone]

## 2023-04-22 NOTE — ED Triage Notes (Signed)
Pt arrived via POV, c/o sore throat and "bumps" on inside of lip. States throat is so painful, unable to tolerate POs

## 2023-04-22 NOTE — ED Notes (Signed)
ED TO INPATIENT HANDOFF REPORT  Name/Age/Gender Timothy Hubbard 32 y.o. male  Code Status Code Status History     Date Active Date Inactive Code Status Order ID Comments User Context   03/10/2022 1704 03/19/2022 1433 Full Code [id number]  Lynnae Prude Inpatient   03/10/2022 1704 03/10/2022 1704 Full Code [id number]  Lynnae Prude Inpatient   03/01/2022 2336 03/10/2022 1617 Full Code [id number]  Charlotte Sanes, MD ED       Home/SNF/Other Home  Chief Complaint Hypokalemia [E87.6]  Level of Care/Admitting Diagnosis ED Disposition     ED Disposition  Admit   Condition  --   Comment  Hospital Area: [facility] [[id number]]  Level of Care: Telemetry [5]  Admit to tele based on following criteria: Monitor QTC interval  May place patient in observation at [facility] or [facility] if equivalent level of care is available:: Yes  Covid Evaluation: Asymptomatic - no recent exposure (last 10 days) testing not required  Diagnosis: Hypokalemia [172180]  Admitting Physician: Briscoe Deutscher [[id number]]  Attending Physician: Briscoe Deutscher [[id number]]          Medical History Past Medical History:  Diagnosis Date   Acute encephalopathy 03/10/2022   Adenomatous polyp of descending colon    Asthma    Encephalopathy acute 03/01/2022   Hematochezia    HTN (hypertension)    Hypokalemia 03/04/2022   Need for hepatitis C screening test 04/27/2022   Seizures (HCC)     Allergies Allergies  Allergen Reactions   Egg Solids, Whole Swelling   Ibuprofen Anaphylaxis   Milk (Cow) Swelling    IV Location/Drains/Wounds Patient Lines/Drains/Airways Status     Active Line/Drains/Airways     Name Placement date Placement time Site Days   Peripheral IV 04/22/23 22 G Right Antecubital 04/22/23  1150  Antecubital  less than 1            Labs/Imaging Results for orders placed or performed during the hospital encounter of 04/22/23 (from the past 48  hour(s))  CBC with Differential     Status: Abnormal   Collection Time: 04/22/23 11:50 AM  Result Value Ref Range   WBC 7.7 4.0 - 10.5 K/uL   RBC 4.04 (L) 4.22 - 5.81 MIL/uL   Hemoglobin 13.1 13.0 - 17.0 g/dL   HCT 02.5 (L) 42.7 - 06.2 %   MCV 95.3 80.0 - 100.0 fL   MCH 32.4 26.0 - 34.0 pg   MCHC 34.0 30.0 - 36.0 g/dL   RDW 37.6 28.3 - 15.1 %   Platelets 366 150 - 400 K/uL   nRBC 0.0 0.0 - 0.2 %   Neutrophils Relative % 63 %   Neutro Abs 4.8 1.7 - 7.7 K/uL   Lymphocytes Relative 26 %   Lymphs Abs 2.0 0.7 - 4.0 K/uL   Monocytes Relative 11 %   Monocytes Absolute 0.9 0.1 - 1.0 K/uL   Eosinophils Relative 0 %   Eosinophils Absolute 0.0 0.0 - 0.5 K/uL   Basophils Relative 0 %   Basophils Absolute 0.0 0.0 - 0.1 K/uL   Immature Granulocytes 0 %   Abs Immature Granulocytes 0.03 0.00 - 0.07 K/uL    Comment: Performed at [facility], [address] [place], [place] [address]  Rapid HIV screen (HIV 1/2 Ab+Ag)     Status: None   Collection Time: 04/22/23 11:50 AM  Result Value Ref Range   HIV-1 P24 Antigen - HIV24 NON REACTIVE  NON REACTIVE    Comment: (NOTE) Detection of p24 may be inhibited by biotin in the sample, causing false negative results in acute infection.    HIV 1/2 Antibodies NON REACTIVE NON REACTIVE   Interpretation (HIV Ag Ab)      A non reactive test result means that HIV 1 or HIV 2 antibodies and HIV 1 p24 antigen were not detected in the specimen.    Comment: Performed at [facility], [address], [place], [place] [address]  Comprehensive metabolic panel     Status: Abnormal   Collection Time: 04/22/23  1:35 PM  Result Value Ref Range   Sodium 137 135 - 145 mmol/L   Potassium 2.0 (LL) 3.5 - 5.1 mmol/L    Comment: CRITICAL RESULT CALLED TO, READ BACK BY AND VERIFIED WITH GARNER, K RN @ 1439 ON 04/22/2023 BY Deedra Ehrich, K    Chloride 111 98 - 111 mmol/L   CO2 13 (L) 22 - 32 mmol/L   Glucose, Bld 51 (L) 70 - 99 mg/dL     Comment: Glucose reference range applies only to samples taken after fasting for at least 8 hours.   BUN 11 6 - 20 mg/dL   Creatinine, Ser 6.04 (L) 0.61 - 1.24 mg/dL   Calcium 6.0 (LL) 8.9 - 10.3 mg/dL    Comment: CRITICAL RESULT CALLED TO, READ BACK BY AND VERIFIED WITH GARNER, K RN @ 1439 ON 04/22/2023 BY Deedra Ehrich, K    Total Protein 4.3 (L) 6.5 - 8.1 g/dL   Albumin 2.2 (L) 3.5 - 5.0 g/dL   AST 9 (L) 15 - 41 U/L   ALT 11 0 - 44 U/L   Alkaline Phosphatase 28 (L) 38 - 126 U/L   Total Bilirubin 0.8 0.3 - 1.2 mg/dL   GFR, Estimated >54 >09 mL/min    Comment: (NOTE) Calculated using the CKD-EPI Creatinine Equation (2021)    Anion gap 13 5 - 15    Comment: Performed at [facility], [address] [place], [place] [address]  CBG monitoring, ED     Status: None   Collection Time: 04/22/23  2:51 PM  Result Value Ref Range   Glucose-Capillary 79 70 - 99 mg/dL    Comment: Glucose reference range applies only to samples taken after fasting for at least 8 hours.  Magnesium     Status: None   Collection Time: 04/22/23  3:36 PM  Result Value Ref Range   Magnesium 1.8 1.7 - 2.4 mg/dL    Comment: Performed at [facility], [address] [place], [place] [address]  Potassium     Status: Abnormal   Collection Time: 04/22/23  3:36 PM  Result Value Ref Range   Potassium 2.7 (LL) 3.5 - 5.1 mmol/L    Comment: CRITICAL RESULT CALLED TO, READ BACK BY AND VERIFIED WITH Melvyn Neth, M RN @ [phone] ON 04/22/2023 Charlean Sanfilippo, K Performed at [facility], [address], [place], [place] [address]   Group A Strep by PCR     Status: None   Collection Time: 04/22/23  3:55 PM   Specimen: Throat; Sterile Swab  Result Value Ref Range   Group A Strep by PCR NOT DETECTED NOT DETECTED    Comment: Performed at [facility], [address], [place], [place] [address]   No results found.  Pending Labs [employer] (From admission, onward)      Start     Ordered  04/22/23 1645  Calcium, ionized  Once,   STAT        04/22/23 1644            Vitals/Pain Today's Vitals   04/22/23 1516 04/22/23 1530 04/22/23 1546 04/22/23 1600  BP:  109/85  (!) 113/93  Pulse:  91  94  Resp:  (!) 24  18  Temp: 98.6 F (37 C)     TempSrc: Oral     SpO2:  100%  100%  PainSc:   7      Isolation Precautions No active isolations  Medications Medications  lidocaine (XYLOCAINE) 2 % viscous mouth solution 15 mL (15 mLs Mouth/Throat Given 04/22/23 1321)  potassium chloride 10 mEq in 100 mL IVPB (10 mEq Intravenous New Bag/Given 04/22/23 1647)  calcium gluconate 1 g/ 50 mL sodium chloride IVPB (1,000 mg Intravenous New Bag/Given 04/22/23 1710)  sodium chloride 0.9 % bolus 1,000 mL (0 mLs Intravenous Stopped 04/22/23 1317)  sodium chloride 0.9 % bolus 1,000 mL (0 mLs Intravenous Stopped 04/22/23 1546)  HYDROcodone-acetaminophen (HYCET) 7.5-325 mg/15 ml solution 10 mL (10 mLs Oral Given 04/22/23 1401)  fluconazole (DIFLUCAN) tablet 200 mg (200 mg Oral Given 04/22/23 1402)    Mobility walks

## 2023-04-22 NOTE — ED Provider Notes (Signed)
[place] EMERGENCY DEPARTMENT AT [facility] Provider Note   CSN: [id number] Arrival date & time: 04/22/23  2025     History  Chief Complaint  Patient presents with   Sore Throat   Mouth Lesions    Timothy Hubbard is a 32 y.o. male.  Patient presents to the emergency department today for evaluation of sores in the mouth.  Patient has a history of stroke, seizures on Depakote, GERD.  He states that his symptoms started spontaneously about 7 days ago.  No associated fevers, nausea, vomiting, diarrhea.  No nasal congestion, runny nose.  Oral intake has been very difficult due to severe pain with swallowing.  No difficulty breathing.  Patient has a history of alcohol use and marijuana use.  Denies injectable drugs.  Denies history of HIV or other immunocompromising illnesses.  His primary care is family practice.  He states that he feels very dehydrated.       Home Medications Prior to Admission medications   Medication Sig Start Date End Date Taking? Authorizing Provider  acetaminophen (TYLENOL) 325 MG tablet Take 2 tablets (650 mg total) by mouth every 6 (six) hours as needed for mild pain or moderate pain. 03/30/22   Littie Deeds, MD  albuterol (PROVENTIL) (2.5 MG/3ML) 0.083% nebulizer solution Take 3 mLs (2.5 mg total) by nebulization every 6 (six) hours as needed for wheezing or shortness of breath. 04/04/23   Vonna Drafts, MD  amLODipine (NORVASC) 10 MG tablet TAKE 1 TABLET (10 MG TOTAL) BY MOUTH DAILY (AM) 04/04/23   Vonna Drafts, MD  divalproex (DEPAKOTE) 500 MG DR tablet Take 1 tablet (500 mg total) by mouth 2 (two) times daily. 04/04/23   Vonna Drafts, MD  ferrous sulfate 324 (65 Fe) MG TBEC Take 1 tablet (325 mg total) by mouth daily at 12 noon. 04/04/23   Vonna Drafts, MD  fluticasone (FLONASE) 50 MCG/ACT nasal spray Place 2 sprays into both nostrils daily. 04/04/23   Vonna Drafts, MD  folic acid (FOLVITE) 1 MG tablet TAKE 1 TABLET (1 MG TOTAL) BY MOUTH DAILY (AM)  04/04/23   Vonna Drafts, MD  hydrocortisone (ANUSOL-HC) 2.5 % rectal cream Place 1 Application rectally 2 (two) times daily. 04/04/23   Vonna Drafts, MD  lisinopril (ZESTRIL) 5 MG tablet TAKE 1 TABLET (5 MG TOTAL) BY MOUTH DAILY (AM) 04/04/23   Vonna Drafts, MD  magnesium oxide (MAG-OX) 400 (240 Mg) MG tablet Take 1 tablet (400 mg total) by mouth 2 (two) times daily. 04/04/23   Vonna Drafts, MD  mometasone-formoterol (DULERA) 100-5 MCG/ACT AERO INHALE TWO PUFFS INTO THE LUNGS TWICE A DAY 04/04/23   Vonna Drafts, MD  Multiple Vitamin (MULTIVITAMIN WITH MINERALS) TABS tablet Take 1 tablet by mouth daily. 04/04/23   Vonna Drafts, MD  naltrexone (DEPADE) 50 MG tablet TAKE 1/2 TABLET (25 MG TOTAL) BY MOUTH DAILY (AM) 04/04/23   Vonna Drafts, MD  nicotine (NICODERM CQ - DOSED IN MG/24 HOURS) 14 mg/24hr patch Place 1 patch (14 mg total) onto the skin daily. 04/04/23   Vonna Drafts, MD  OLANZapine (ZYPREXA) 5 MG tablet Take 1 tablet (5 mg total) by mouth at bedtime. Future refills will need to come from psychiatry. 04/04/23   Vonna Drafts, MD  pantoprazole (PROTONIX) 40 MG tablet TAKE 1 TABLET (40 MG TOTAL) BY MOUTH DAILY (AM) 04/04/23   Vonna Drafts, MD  polyethylene glycol (MIRALAX / GLYCOLAX) 17 g packet MIX AND DRINK BY MOUTH DAILY AS NEEDED FOR MODERATE CONSTIPATION 04/04/23  Vonna Drafts, MD  VENTOLIN HFA 108 (90 Base) MCG/ACT inhaler Inhale 1-2 puffs into the lungs every 6 (six) hours as needed for wheezing or shortness of breath. 04/04/23   Vonna Drafts, MD      Allergies    Egg solids, whole; Ibuprofen; and Milk (cow)    Review of Systems   Review of Systems  Physical Exam Updated Vital Signs BP (!) 129/99 (BP Location: Left Arm)   Pulse (!) 117   Temp 99.5 F (37.5 C) (Oral)   Resp 16   SpO2 100%  Physical Exam Vitals and nursing note reviewed.  Constitutional:      General: He is not in acute distress.    Appearance: He is well-developed.  HENT:     Head: Normocephalic and  atraumatic.     Right Ear: Tympanic membrane normal. Tympanic membrane is not erythematous.     Left Ear: Tympanic membrane normal. Tympanic membrane is not erythematous.     Mouth/Throat:     Pharynx: Oropharyngeal exudate and posterior oropharyngeal erythema present.     Comments: Initially I was unable to see the pharynx due to patient discomfort however he has severe stomatitis, erythema of the tongue with associated white patches and ulcerations. Eyes:     General:        Right eye: No discharge.        Left eye: No discharge.     Conjunctiva/sclera: Conjunctivae normal.  Cardiovascular:     Rate and Rhythm: Normal rate and regular rhythm.     Heart sounds: Normal heart sounds.  Pulmonary:     Effort: Pulmonary effort is normal.     Breath sounds: Normal breath sounds.  Abdominal:     Palpations: Abdomen is soft.     Tenderness: There is no abdominal tenderness.  Musculoskeletal:     Cervical back: Normal range of motion and neck supple.  Skin:    General: Skin is warm and dry.     Comments: Mild scaling of the skin around the hands but no lesions or sores.  Neurological:     Mental Status: He is alert.      ED Results / Procedures / Treatments   Labs (all labs ordered are listed, but only abnormal results are displayed) Labs Reviewed  CBC WITH DIFFERENTIAL/PLATELET - Abnormal; Notable for the following components:      Result Value   RBC 4.04 (*)    HCT 38.5 (*)    All other components within normal limits  COMPREHENSIVE METABOLIC PANEL - Abnormal; Notable for the following components:   Potassium 2.0 (*)    CO2 13 (*)    Glucose, Bld 51 (*)    Creatinine, Ser 0.60 (*)    Calcium 6.0 (*)    Total Protein 4.3 (*)    Albumin 2.2 (*)    AST 9 (*)    Alkaline Phosphatase 28 (*)    All other components within normal limits  GROUP A STREP BY PCR  RAPID HIV SCREEN (HIV 1/2 AB+AG)  MAGNESIUM  POTASSIUM  CBG MONITORING, ED    EKG None  Radiology No results  found.  Procedures Procedures    Medications Ordered in ED Medications  lidocaine (XYLOCAINE) 2 % viscous mouth solution 15 mL (15 mLs Mouth/Throat Given 04/22/23 1321)  potassium chloride 10 mEq in 100 mL IVPB (has no administration in time range)  sodium chloride 0.9 % bolus 1,000 mL (0 mLs Intravenous Stopped 04/22/23 1317)  sodium chloride 0.9 %  bolus 1,000 mL (1,000 mLs Intravenous New Bag/Given 04/22/23 1317)  HYDROcodone-acetaminophen (HYCET) 7.5-325 mg/15 ml solution 10 mL (10 mLs Oral Given 04/22/23 1401)  fluconazole (DIFLUCAN) tablet 200 mg (200 mg Oral Given 04/22/23 1402)    ED Course/ Medical Decision Making/ A&P    Patient seen and examined. History obtained directly from patient.   Labs/EKG: Ordered CBC, CMP, HIV.  Imaging: None ordered.  Considered imaging of the neck, however voice is not muffled, full range of motion of neck without difficulty.  Medications/Fluids: Ordered: IV fluid bolus, viscous lidocaine.   Most recent vital signs reviewed and are as follows: BP (!) 129/99 (BP Location: Left Arm)   Pulse (!) 117   Temp 99.5 F (37.5 C) (Oral)   Resp 16   SpO2 100%   Initial impression: Stomatitis, unclear etiology  2:08 PM Reassessment performed. Patient appears stable.  Will give Hycet for pain.  Will treat presumptively for yeast.  Patient is receiving second liter IV fluids.  Labs personally reviewed and interpreted including: CMP pending, CBC with normal white blood cell count, hemoglobin normal, however patient's baseline is anemia and I suspect an element of dehydration; HIV testing negative.  Reviewed pertinent lab work and imaging with patient at bedside. Questions answered.   Most current vital signs reviewed and are as follows: BP (!) 129/99 (BP Location: Left Arm)   Pulse (!) 117   Temp 99.5 F (37.5 C) (Oral)   Resp 16   SpO2 100%   Plan: Pending CMP results, discharge to home with medication for pain, candidiasis, and close PCP  follow-up appointment.  Patient is a family practice patient.  I spoke with on-call resident who will assist in obtaining follow-up, hopefully for Monday.  They will call patient with appointment details.  3:22 PM Reassessment performed. Patient appears stable.  Labs personally reviewed and interpreted including: CMP with multiple abnormalities including critically low potassium at 2.0, creatinine slightly low at 0.6, calcium 6.0 which corrects to 7.4 due to hypoalbuminemia.  Most current vital signs reviewed and are as follows: BP (!) 127/98 (BP Location: Left Arm)   Pulse 88   Temp 98.6 F (37 C) (Oral)   Resp 14   SpO2 100%   Plan: Will confirm low potassium, check magnesium and EKG.  Will send strep testing.  Signout to [employer] at shift change.  After completion of workup, patient will need admitted for hypokalemia treatment.                                    Medical Decision Making Amount and/or Complexity of Data Reviewed Labs: ordered.  Risk Prescription drug management.   Pending completion of work-up.   In regards to the patient's sore throat today, the following dangerous and potentially life threatening etiologies were considered on the differential diagnosis: Lugwig's angina, uvulitis, epiglottis, peritonsillar abscess, retropharyngeal abscess, Lemierre's syndrome. Also considered were more common causes such as: streptococcal pharyngitis, gonococcal pharyngitis, non-bacterial pharyngitis (cold viruses, HSV/coxsackievirus, influenza, COVID-19, infectious mononucleosis, oropharyngeal candidiasis), and other non-infectious causes including seasonal allergies/post-nasal drip, GERD/esophagitis, trauma.           Final Clinical Impression(s) / ED Diagnoses Final diagnoses:  Hypokalemia  Unspecified lesions of oral mucosa    Rx / DC Orders ED Discharge Orders     None         Renne Crigler, PA-C 04/22/23 1525    Long, Ivin Booty  Reece Agar, MD 04/24/23  [phone]

## 2023-04-22 NOTE — ED Notes (Signed)
Received a call from the lab with a critical value. Patient's Calcium is 6.0 and Potassium is 2.0. The provider was notified.

## 2023-04-22 NOTE — H&P (Signed)
History and Physical    Timothy Hubbard:[id number] DOB: 1990-11-18 DOA: 04/22/2023  PCP: Vonna Drafts, MD   Patient coming from: Home   Chief Complaint: Mouth and throat pain   HPI: Timothy Hubbard is a 32 y.o. male with medical history significant for hypertension, asthma, seizures, and mood disorder presents emergency department with sore throat and mouth pain.  Patient reports roughly 1 week of worsening sore throat and mouth.  He has noted white lesions involving his tongue and mucosa.  Pain with swallowing is now preventing him from eating or drinking at home.   He reports only drinking 1 or 2 beers daily.  He reports strict adherence with his medications including Dulera.  ED Course: Upon arrival to the ED, patient is found to be afebrile and saturating well on room air with stable blood pressure.  EKG is notable for QTc 526.  Labs are most notable for potassium 2.0, bicarbonate 13, glucose 51 nonreactive HIV, and negative rapid strep test.  Patient was treated in the ED with Diflucan, viscous lidocaine, potassium, IV fluids, and IV calcium.  Review of Systems:  All other systems reviewed and apart from HPI, are negative.  Past Medical History:  Diagnosis Date   Acute encephalopathy 03/10/2022   Adenomatous polyp of descending colon    Asthma    Encephalopathy acute 03/01/2022   Hematochezia    HTN (hypertension)    Hypokalemia 03/04/2022   Need for hepatitis C screening test 04/27/2022   Seizures (HCC)     Past Surgical History:  Procedure Laterality Date   COLONOSCOPY WITH PROPOFOL N/A 03/14/2022   Procedure: COLONOSCOPY WITH PROPOFOL;  Surgeon: Shellia Cleverly, DO;  Location: [facility];  Service: Gastroenterology;  Laterality: N/A;   POLYPECTOMY  03/14/2022   Procedure: POLYPECTOMY;  Surgeon: Shellia Cleverly, DO;  Location: [facility];  Service: Gastroenterology;;    Social History:   reports that he has been smoking cigarettes. He started smoking about  15 years ago. He has a 7.8 pack-year smoking history. He has never used smokeless tobacco. He reports current alcohol use of about 42.0 standard drinks of alcohol per week. He reports current drug use. Frequency: 7.00 times per week. Drug: Marijuana.  Allergies  Allergen Reactions   Egg Solids, Whole Swelling   Ibuprofen Anaphylaxis   Milk (Cow) Swelling    Family History  Problem Relation Age of Onset   Hypertension Mother    Cancer Maternal Grandfather        doesn't know the type     Prior to Admission medications   Medication Sig Start Date End Date Taking? Authorizing Provider  acetaminophen (TYLENOL) 325 MG tablet Take 2 tablets (650 mg total) by mouth every 6 (six) hours as needed for mild pain or moderate pain. 03/30/22   Littie Deeds, MD  albuterol (PROVENTIL) (2.5 MG/3ML) 0.083% nebulizer solution Take 3 mLs (2.5 mg total) by nebulization every 6 (six) hours as needed for wheezing or shortness of breath. 04/04/23   Vonna Drafts, MD  amLODipine (NORVASC) 10 MG tablet TAKE 1 TABLET (10 MG TOTAL) BY MOUTH DAILY (AM) 04/04/23   Vonna Drafts, MD  divalproex (DEPAKOTE) 500 MG DR tablet Take 1 tablet (500 mg total) by mouth 2 (two) times daily. 04/04/23   Vonna Drafts, MD  ferrous sulfate 324 (65 Fe) MG TBEC Take 1 tablet (325 mg total) by mouth daily at 12 noon. 04/04/23   Vonna Drafts, MD  fluticasone (FLONASE) 50 MCG/ACT nasal spray Place  2 sprays into both nostrils daily. 04/04/23   Vonna Drafts, MD  folic acid (FOLVITE) 1 MG tablet TAKE 1 TABLET (1 MG TOTAL) BY MOUTH DAILY (AM) 04/04/23   Vonna Drafts, MD  hydrocortisone (ANUSOL-HC) 2.5 % rectal cream Place 1 Application rectally 2 (two) times daily. 04/04/23   Vonna Drafts, MD  lisinopril (ZESTRIL) 5 MG tablet TAKE 1 TABLET (5 MG TOTAL) BY MOUTH DAILY (AM) 04/04/23   Vonna Drafts, MD  magnesium oxide (MAG-OX) 400 (240 Mg) MG tablet Take 1 tablet (400 mg total) by mouth 2 (two) times daily. 04/04/23   Vonna Drafts, MD   mometasone-formoterol (DULERA) 100-5 MCG/ACT AERO INHALE TWO PUFFS INTO THE LUNGS TWICE A DAY 04/04/23   Vonna Drafts, MD  Multiple Vitamin (MULTIVITAMIN WITH MINERALS) TABS tablet Take 1 tablet by mouth daily. 04/04/23   Vonna Drafts, MD  naltrexone (DEPADE) 50 MG tablet TAKE 1/2 TABLET (25 MG TOTAL) BY MOUTH DAILY (AM) 04/04/23   Vonna Drafts, MD  nicotine (NICODERM CQ - DOSED IN MG/24 HOURS) 14 mg/24hr patch Place 1 patch (14 mg total) onto the skin daily. 04/04/23   Vonna Drafts, MD  OLANZapine (ZYPREXA) 5 MG tablet Take 1 tablet (5 mg total) by mouth at bedtime. Future refills will need to come from psychiatry. 04/04/23   Vonna Drafts, MD  pantoprazole (PROTONIX) 40 MG tablet TAKE 1 TABLET (40 MG TOTAL) BY MOUTH DAILY (AM) 04/04/23   Vonna Drafts, MD  polyethylene glycol (MIRALAX / GLYCOLAX) 17 g packet MIX AND DRINK BY MOUTH DAILY AS NEEDED FOR MODERATE CONSTIPATION 04/04/23   Vonna Drafts, MD  VENTOLIN HFA 108 (90 Base) MCG/ACT inhaler Inhale 1-2 puffs into the lungs every 6 (six) hours as needed for wheezing or shortness of breath. 04/04/23   Vonna Drafts, MD    Physical Exam: Vitals:   04/22/23 1504 04/22/23 1516 04/22/23 1530 04/22/23 1600  BP: (!) 127/98  109/85 (!) 113/93  Pulse: 88  91 94  Resp: 14  (!) 24 18  Temp:  98.6 F (37 C)    TempSrc:  Oral    SpO2: 100%  100% 100%     Constitutional: NAD, calm  Eyes: PERTLA, lids and conjunctivae normal ENMT: Mucous membranes are moist. Oropharnyx erythematous with white papules.   Neck: supple, no masses  Respiratory: clear to auscultation bilaterally, no wheezing, no crackles.    Cardiovascular: S1 & S2 heard, regular rate and rhythm. No extremity edema.  Abdomen: No distension, no tenderness, soft. Bowel sounds active.  Musculoskeletal: no clubbing / cyanosis. No joint deformity upper and lower extremities.   Skin: no significant rashes, lesions, ulcers. Warm, dry, well-perfused. Neurologic: CN 2-12 grossly intact. Moving  all extremities. Alert and oriented.  Psychiatric: Calm. Cooperative.    Labs and Imaging on Admission: I have personally reviewed following labs and imaging studies  CBC: Recent Labs  Lab 04/22/23 1150  WBC 7.7  NEUTROABS 4.8  HGB 13.1  HCT 38.5*  MCV 95.3  PLT 366   Basic Metabolic Panel: Recent Labs  Lab 04/22/23 1335 04/22/23 1536  NA 137  --   K 2.0* 2.7*  CL 111  --   CO2 13*  --   GLUCOSE 51*  --   BUN 11  --   CREATININE 0.60*  --   CALCIUM 6.0*  --   MG  --  1.8   GFR: CrCl cannot be calculated (Unknown ideal weight.). Liver Function Tests: Recent Labs  Lab 04/22/23 1335  AST 9*  ALT 11  ALKPHOS 28*  BILITOT 0.8  PROT 4.3*  ALBUMIN 2.2*   No results for input(s): "LIPASE", "AMYLASE" in the last 168 hours. No results for input(s): "AMMONIA" in the last 168 hours. Coagulation Profile: No results for input(s): "INR", "PROTIME" in the last 168 hours. Cardiac Enzymes: No results for input(s): "CKTOTAL", "CKMB", "CKMBINDEX", "TROPONINI" in the last 168 hours. BNP (last 3 results) No results for input(s): "PROBNP" in the last 8760 hours. HbA1C: No results for input(s): "HGBA1C" in the last 72 hours. CBG: Recent Labs  Lab 04/22/23 1451  GLUCAP 79   Lipid Profile: No results for input(s): "CHOL", "HDL", "LDLCALC", "TRIG", "CHOLHDL", "LDLDIRECT" in the last 72 hours. Thyroid Function Tests: No results for input(s): "TSH", "T4TOTAL", "FREET4", "T3FREE", "THYROIDAB" in the last 72 hours. Anemia Panel: No results for input(s): "VITAMINB12", "FOLATE", "FERRITIN", "TIBC", "IRON", "RETICCTPCT" in the last 72 hours. Urine analysis:    Component Value Date/Time   COLORURINE YELLOW 03/15/2022 1757   APPEARANCEUR HAZY (A) 03/15/2022 1757   LABSPEC 1.016 03/15/2022 1757   PHURINE 7.0 03/15/2022 1757   GLUCOSEU NEGATIVE 03/15/2022 1757   HGBUR LARGE (A) 03/15/2022 1757   BILIRUBINUR NEGATIVE 03/15/2022 1757   BILIRUBINUR moderate 10/19/2016 1015    KETONESUR NEGATIVE 03/15/2022 1757   PROTEINUR NEGATIVE 03/15/2022 1757   UROBILINOGEN 2.0 10/19/2016 1015   NITRITE NEGATIVE 03/15/2022 1757   LEUKOCYTESUR NEGATIVE 03/15/2022 1757   Sepsis Labs: @LABRCNTIP (procalcitonin:4,lacticidven:4) ) Recent Results (from the past 240 hour(s))  Group A Strep by PCR     Status: None   Collection Time: 04/22/23  3:55 PM   Specimen: Throat; Sterile Swab  Result Value Ref Range Status   Group A Strep by PCR NOT DETECTED NOT DETECTED Final    Comment: Performed at [facility], [address] [place], [place] [address]     Radiological Exams on Admission: No results found.  EKG: Independently reviewed. Sinus rhythm, QTc 526.   Assessment/Plan   1. Hypokalemia  - Serum potassium was 2.0 in ED in setting of recent anorexia  - Continue replacement, repeat chem panel in am    2. Oral candidiasis  - Continue Diflucan, rinse oropharynx after each Dulera use   3. Seizures  - Continue valproate    4. Mood disorder  - Continue Zyprexa at bedtime    5. Hypertension  - Continue Norvasc and lisionpril    6. Prolonged QT interval  - Replace potassium, supplement magnesium, avoid QT-prolonging medications   7. Asthma  - Not in exacerbation  - Continue ICS-LABA and as-needed albuterol     DVT prophylaxis: Lovenox Code Status: Full  Level of Care: Level of care: Telemetry Family Communication: None present  Disposition Plan:  Patient is from: home  Anticipated d/c is to: Home  Anticipated d/c date is: 8/18 or 04/24/23  Patient currently: Pending electrolyte repletion, tolerance of adequate oral intake  Consults called: None  Admission status: Observation     Briscoe Deutscher, MD [facility]  04/22/2023, 6:10 PM

## 2023-04-23 DIAGNOSIS — E876 Hypokalemia: Secondary | ICD-10-CM

## 2023-04-23 LAB — CBC
HCT: 36.2 % — ABNORMAL LOW (ref 39.0–52.0)
Hemoglobin: 11.5 g/dL — ABNORMAL LOW (ref 13.0–17.0)
MCH: 32.5 pg (ref 26.0–34.0)
MCHC: 31.8 g/dL (ref 30.0–36.0)
MCV: 102.3 fL — ABNORMAL HIGH (ref 80.0–100.0)
Platelets: 291 10*3/uL (ref 150–400)
RBC: 3.54 MIL/uL — ABNORMAL LOW (ref 4.22–5.81)
RDW: 11.7 % (ref 11.5–15.5)
WBC: 6 10*3/uL (ref 4.0–10.5)
nRBC: 0 % (ref 0.0–0.2)

## 2023-04-23 LAB — MAGNESIUM: Magnesium: 2.1 mg/dL (ref 1.7–2.4)

## 2023-04-23 LAB — BASIC METABOLIC PANEL
Anion gap: 13 (ref 5–15)
BUN: 8 mg/dL (ref 6–20)
CO2: 17 mmol/L — ABNORMAL LOW (ref 22–32)
Calcium: 8.4 mg/dL — ABNORMAL LOW (ref 8.9–10.3)
Chloride: 105 mmol/L (ref 98–111)
Creatinine, Ser: 0.61 mg/dL (ref 0.61–1.24)
GFR, Estimated: >60 mL/min (ref >60)
Glucose, Bld: 90 mg/dL (ref 70–99)
Potassium: 3.6 mmol/L (ref 3.5–5.1)
Sodium: 135 mmol/L (ref 135–145)

## 2023-04-23 MED ORDER — PEDIALYTE PO SOLN: 240.0000 mL | Freq: Four times a day (QID) | ORAL | 2 refills | Status: DC

## 2023-04-23 MED ORDER — ENSURE ENLIVE PO LIQD
237.0000 mL | Freq: Two times a day (BID) | ORAL | Status: DC
  Administered 2023-04-23: 237 mL via ORAL

## 2023-04-23 MED ORDER — ENSURE ENLIVE PO LIQD: 237.0000 mL | Freq: Two times a day (BID) | ORAL | 12 refills | Status: AC

## 2023-04-23 MED ORDER — NYSTATIN 100000 UNIT/ML MT SUSP: 5.0000 mL | Freq: Four times a day (QID) | OROMUCOSAL | 0 refills | Status: DC

## 2023-04-23 MED ORDER — NYSTATIN 100000 UNIT/ML MT SUSP
5.0000 mL | Freq: Four times a day (QID) | OROMUCOSAL | Status: DC
  Administered 2023-04-23 (×2): 500000 [IU] via ORAL
  Filled 2023-04-23 (×2): qty 5

## 2023-04-23 MED ORDER — LIDOCAINE VISCOUS HCL 2 % MT SOLN: 15.0000 mL | OROMUCOSAL | 1 refills | Status: DC | PRN

## 2023-04-23 MED ORDER — FLUCONAZOLE 200 MG PO TABS: 100.0000 mg | ORAL_TABLET | Freq: Every day | ORAL | 0 refills | Status: DC

## 2023-04-23 MED ORDER — PEDIALYTE PO SOLN
240.0000 mL | ORAL | Status: DC
  Administered 2023-04-23: 240 mL via ORAL
  Filled 2023-04-23: qty 1000

## 2023-04-23 MED ORDER — POTASSIUM CHLORIDE 10 MEQ/100ML IV SOLN
10.0000 meq | Freq: Once | INTRAVENOUS | Status: AC
  Administered 2023-04-23: 10 meq via INTRAVENOUS
  Filled 2023-04-23: qty 100

## 2023-04-23 NOTE — Plan of Care (Signed)
Discuss and review plan of care with patient/family  

## 2023-04-23 NOTE — Discharge Summary (Signed)
Physician Discharge Summary  Timothy Hubbard WGN:[id number] DOB: 11-23-1990 DOA: 04/22/2023  PCP: Vonna Drafts, MD  Admit date: 04/22/2023 Discharge date: 04/23/2023  Admitted From: Home  Disposition: Home   Recommendations for Outpatient Follow-up:  Follow up with PCP in 1-2 weeks Please obtain BMP/CBC in one week Dr. Renaee Munda please follow-up on his blood pressure medications and BP as Norvasc and lisinopril was stopped during his hospital stay due to soft BP.  Home Health: None  Equipment/Devices: None  Discharge Condition: Stable CODE STATUS: Full code Diet recommendation: Mechanical soft diet cardiac Brief/Interim Summary:  32 y.o. male with medical history significant for hypertension, asthma, seizures, and mood disorder presents emergency department with sore throat and mouth pain, for the past 1 week it has progressively getting worse.  He was not able to eat or drink anything but beer at home. He takes [facility] inhaler on a regular basis.   ED Course: Upon arrival to the ED, patient is found to be afebrile and saturating well on room air with stable blood pressure.  EKG is notable for QTc 526.  Labs are most notable for potassium 2.0, bicarbonate 13, glucose 51 nonreactive HIV, and negative rapid strep test. Patient was treated in the ED with Diflucan, viscous lidocaine, potassium, IV fluids, and IV calcium.  Discharge Diagnoses:  Principal Problem:   Hypokalemia Active Problems:   Seizure (HCC)   Mood disorder (HCC)   Essential hypertension   Asthma   Prolonged QT interval   Oral candidiasis  1. Hypokalemia resolved with aggressive treatment potassium was 3.6 on discharge    2. Oral candidiasis he was discharged on Diflucan and nystatin grazed him to use oral rinse after Dulera use   3. Seizures  - Continue valproate     4. Mood disorder  - Continue Zyprexa at bedtime     5. Hypertension -optimize blood pressure medications on discharge due to soft to normal blood  pressure his blood pressure was 115/80 during the time he was in the hospital without getting antihypertensives.   6 asthma continue inhalers   7 polysubstance abuse he drinks alcohol, tobacco smoker, THC and cocaine encouraged to quit needs close follow-up with PCP and ongoing guidance and support  Estimated body mass index is 20.34 kg/m as calculated from the following:   Height as of this encounter: 5\' 6"  (1.676 m).   Weight as of this encounter: 57.2 kg.  Discharge Instructions  Discharge Instructions     Diet - low sodium heart healthy   Complete by: As directed    Increase activity slowly   Complete by: As directed       Allergies as of 04/23/2023       Reactions   Ibuprofen Anaphylaxis        Medication List     STOP taking these medications    amLODipine 10 MG tablet Commonly known as: NORVASC   lisinopril 5 MG tablet Commonly known as: ZESTRIL   magnesium oxide 400 (240 Mg) MG tablet Commonly known as: MAG-OX   nicotine 14 mg/24hr patch Commonly known as: NICODERM CQ - dosed in mg/24 hours       TAKE these medications    acetaminophen 325 MG tablet Commonly known as: TYLENOL Take 2 tablets (650 mg total) by mouth every 6 (six) hours as needed for mild pain or moderate pain.   albuterol (2.5 MG/3ML) 0.083% nebulizer solution Commonly known as: PROVENTIL Take 3 mLs (2.5 mg total) by nebulization every 6 (six) hours  as needed for wheezing or shortness of breath.   Ventolin HFA 108 (90 Base) MCG/ACT inhaler Generic drug: albuterol Inhale 1-2 puffs into the lungs every 6 (six) hours as needed for wheezing or shortness of breath.   divalproex 500 MG DR tablet Commonly known as: DEPAKOTE Take 1 tablet (500 mg total) by mouth 2 (two) times daily.   Dulera 100-5 MCG/ACT Aero Generic drug: mometasone-formoterol INHALE TWO PUFFS INTO THE LUNGS TWICE A DAY What changed:  how much to take how to take this when to take this additional  instructions   feeding supplement Liqd Take 237 mLs by mouth 2 (two) times daily between meals. Start taking on: April 24, 2023   ferrous sulfate 324 (65 Fe) MG Tbec Take 1 tablet (325 mg total) by mouth daily at 12 noon.   fluconazole 200 MG tablet Commonly known as: DIFLUCAN Take 0.5 tablets (100 mg total) by mouth daily. Start taking on: April 24, 2023   fluticasone 50 MCG/ACT nasal spray Commonly known as: FLONASE Place 2 sprays into both nostrils daily. What changed:  when to take this reasons to take this   folic acid 1 MG tablet Commonly known as: FOLVITE TAKE 1 TABLET (1 MG TOTAL) BY MOUTH DAILY (AM) What changed:  how much to take how to take this when to take this additional instructions   hydrocortisone 2.5 % rectal cream Commonly known as: Anusol-HC Place 1 Application rectally 2 (two) times daily.   lidocaine 2 % solution Commonly known as: XYLOCAINE Use as directed 15 mLs in the mouth or throat every 4 (four) hours as needed for mouth pain.   multivitamin with minerals Tabs tablet Take 1 tablet by mouth daily.   naltrexone 50 MG tablet Commonly known as: DEPADE TAKE 1/2 TABLET (25 MG TOTAL) BY MOUTH DAILY (AM) What changed:  how much to take how to take this when to take this additional instructions   nystatin 100000 UNIT/ML suspension Commonly known as: MYCOSTATIN Take 5 mLs (500,000 Units total) by mouth 4 (four) times daily.   OLANZapine 5 MG tablet Commonly known as: ZYPREXA Take 1 tablet (5 mg total) by mouth at bedtime. Future refills will need to come from psychiatry.   pantoprazole 40 MG tablet Commonly known as: PROTONIX TAKE 1 TABLET (40 MG TOTAL) BY MOUTH DAILY (AM) What changed:  how much to take how to take this when to take this additional instructions   Pedialyte Soln Take 240 mLs by mouth every 6 (six) hours.   polyethylene glycol 17 g packet Commonly known as: MIRALAX / GLYCOLAX MIX AND DRINK BY MOUTH DAILY AS  NEEDED FOR MODERATE CONSTIPATION        Allergies  Allergen Reactions   Ibuprofen Anaphylaxis    Consultations: None none   Procedures/Studies: No results found. None   Subjective: Anxious to go home able to drink something today like Pedialyte and eat  Discharge Exam: Vitals:   04/23/23 0241 04/23/23 0649  BP: (!) 124/94 115/80  Pulse: 90 86  Resp: 16 15  Temp: 98 F (36.7 C) 98.1 F (36.7 C)  SpO2: 100% 100%   Vitals:   04/22/23 2320 04/23/23 0241 04/23/23 0513 04/23/23 0649  BP:  (!) 124/94  115/80  Pulse:  90  86  Resp:  16  15  Temp:  98 F (36.7 C)  98.1 F (36.7 C)  TempSrc:  Oral  Oral  SpO2:  100%  100%  Weight:   57.2 kg  Height: 5\' 6"  (1.676 m)       General: Pt is alert, awake, not in acute distress Cardiovascular: RRR, S1/S2 +, no rubs, no gallops Respiratory: CTA bilaterally, no wheezing, no rhonchi Abdominal: Soft, NT, ND, bowel sounds + Extremities: no edema, no cyanosis    The results of significant diagnostics from this hospitalization (including imaging, microbiology, ancillary and laboratory) are listed below for reference.     Microbiology: Recent Results (from the past 240 hour(s))  Group A Strep by PCR     Status: None   Collection Time: 04/22/23  3:55 PM   Specimen: Throat; Sterile Swab  Result Value Ref Range Status   Group A Strep by PCR NOT DETECTED NOT DETECTED Final    Comment: Performed at [facility], [address] [place], [place] [address]     Labs: BNP (last 3 results) No results for input(s): "BNP" in the last 8760 hours. Basic Metabolic Panel: Recent Labs  Lab 04/22/23 1335 04/22/23 1536 04/23/23 0821  NA 137  --  135  K 2.0* 2.7* 3.6  CL 111  --  105  CO2 13*  --  17*  GLUCOSE 51*  --  90  BUN 11  --  8  CREATININE 0.60*  --  0.61  CALCIUM 6.0*  --  8.4*  MG  --  1.8 2.1   Liver Function Tests: Recent Labs  Lab 04/22/23 1335  AST 9*  ALT 11  ALKPHOS 28*  BILITOT  0.8  PROT 4.3*  ALBUMIN 2.2*   No results for input(s): "LIPASE", "AMYLASE" in the last 168 hours. No results for input(s): "AMMONIA" in the last 168 hours. CBC: Recent Labs  Lab 04/22/23 1150 04/23/23 0821  WBC 7.7 6.0  NEUTROABS 4.8  --   HGB 13.1 11.5*  HCT 38.5* 36.2*  MCV 95.3 102.3*  PLT 366 291   Cardiac Enzymes: No results for input(s): "CKTOTAL", "CKMB", "CKMBINDEX", "TROPONINI" in the last 168 hours. BNP: Invalid input(s): "POCBNP" CBG: Recent Labs  Lab 04/22/23 1451  GLUCAP 79   D-Dimer No results for input(s): "DDIMER" in the last 72 hours. Hgb A1c No results for input(s): "HGBA1C" in the last 72 hours. Lipid Profile No results for input(s): "CHOL", "HDL", "LDLCALC", "TRIG", "CHOLHDL", "LDLDIRECT" in the last 72 hours. Thyroid function studies No results for input(s): "TSH", "T4TOTAL", "T3FREE", "THYROIDAB" in the last 72 hours.  Invalid input(s): "FREET3" Anemia work up No results for input(s): "VITAMINB12", "FOLATE", "FERRITIN", "TIBC", "IRON", "RETICCTPCT" in the last 72 hours. Urinalysis    Component Value Date/Time   COLORURINE YELLOW 03/15/2022 1757   APPEARANCEUR HAZY (A) 03/15/2022 1757   LABSPEC 1.016 03/15/2022 1757   PHURINE 7.0 03/15/2022 1757   GLUCOSEU NEGATIVE 03/15/2022 1757   HGBUR LARGE (A) 03/15/2022 1757   BILIRUBINUR NEGATIVE 03/15/2022 1757   BILIRUBINUR moderate 10/19/2016 1015   KETONESUR NEGATIVE 03/15/2022 1757   PROTEINUR NEGATIVE 03/15/2022 1757   UROBILINOGEN 2.0 10/19/2016 1015   NITRITE NEGATIVE 03/15/2022 1757   LEUKOCYTESUR NEGATIVE 03/15/2022 1757   Sepsis Labs Recent Labs  Lab 04/22/23 1150 04/23/23 0821  WBC 7.7 6.0   Microbiology Recent Results (from the past 240 hour(s))  Group A Strep by PCR     Status: None   Collection Time: 04/22/23  3:55 PM   Specimen: Throat; Sterile Swab  Result Value Ref Range Status   Group A Strep by PCR NOT DETECTED NOT DETECTED Final    Comment: Performed at [employer] [facility], [address], [place], [place] [address]     Time coordinating discharge: 38 minutes  SIGNED:   Alwyn Ren, MD  [facility] 04/23/2023, 4:09 PM

## 2023-04-23 NOTE — Progress Notes (Signed)
Patient refused his blood draw this morning. Phlebotomist has rescheduled it to 8 am. On call provider has been notified. Patient's education is provided.

## 2023-04-24 ENCOUNTER — Telehealth: Payer: Self-pay

## 2023-04-24 LAB — CALCIUM, IONIZED: Calcium, Ionized, Serum: 3.6 mg/dL — ABNORMAL LOW (ref 4.5–5.6)

## 2023-04-24 NOTE — Transitions of Care (Post Inpatient/ED Visit) (Unsigned)
   04/24/2023  Name: Timothy Hubbard MRN: [id number] DOB: 1990/09/11  Today's TOC FU Call Status: Today's TOC FU Call Status:: Unsuccessful Call (1st Attempt) Unsuccessful Call (1st Attempt) Date: 04/24/23  Attempted to reach the patient regarding the most recent Inpatient/ED visit.  Follow Up Plan: Additional outreach attempts will be made to reach the patient to complete the Transitions of Care (Post Inpatient/ED visit) call.   Signature Karena Addison, LPN [facility] Nurse Health Advisor Direct Dial [phone]

## 2023-04-25 NOTE — Transitions of Care (Post Inpatient/ED Visit) (Signed)
04/25/2023  Name: Timothy Hubbard MRN: [id number] DOB: 07-17-1991  Today's TOC FU Call Status: Today's TOC FU Call Status:: Successful TOC FU Call Completed Unsuccessful Call (1st Attempt) Date: 04/24/23 [facility] FU Call Complete Date: 04/25/23  Transition Care Management Follow-up Telephone Call Date of Discharge: 04/23/23 Discharge Facility: [facility]) Type of Discharge: Inpatient Admission Primary Inpatient Discharge Diagnosis:: hypokalemia How have you been since you were released from the hospital?: Better Any questions or concerns?: No  Items Reviewed: Did you receive and understand the discharge instructions provided?: No Medications obtained,verified, and reconciled?: Yes (Medications Reviewed) Any new allergies since your discharge?: No Dietary orders reviewed?: Yes Do you have support at home?: Yes People in Home: parent(s)  Medications Reviewed Today: Medications Reviewed Today     Reviewed by Karena Addison, LPN (Licensed Practical Nurse) on 04/25/23 at 1608  Med List Status: <None>   Medication Order Taking? Sig Documenting Provider Last Dose Status Informant  acetaminophen (TYLENOL) 325 MG tablet [id number] No Take 2 tablets (650 mg total) by mouth every 6 (six) hours as needed for mild pain or moderate pain. Littie Deeds, MD Past Week Active Self  albuterol (PROVENTIL) (2.5 MG/3ML) 0.083% nebulizer solution [id number] No Take 3 mLs (2.5 mg total) by nebulization every 6 (six) hours as needed for wheezing or shortness of breath. Vonna Drafts, MD Past Week Active Self  divalproex (DEPAKOTE) 500 MG DR tablet [id number] No Take 1 tablet (500 mg total) by mouth 2 (two) times daily. Vonna Drafts, MD 04/22/2023 Active Self  feeding supplement (ENSURE ENLIVE / ENSURE PLUS) LIQD [id number]  Take 237 mLs by mouth 2 (two) times daily between meals. Alwyn Ren, MD  Active   ferrous sulfate 324 (65 Fe) MG TBEC [id number] No Take 1 tablet (325 mg total) by mouth daily at  12 noon. Vonna Drafts, MD 04/22/2023 Active Self  fluconazole (DIFLUCAN) 200 MG tablet [id number]  Take 0.5 tablets (100 mg total) by mouth daily. Alwyn Ren, MD  Active   fluticasone [facility]) 50 MCG/ACT nasal spray [id number] No Place 2 sprays into both nostrils daily.  Patient taking differently: Place 2 sprays into both nostrils daily as needed for allergies.   Vonna Drafts, MD Past Week Active Self  folic acid (FOLVITE) 1 MG tablet [id number] No TAKE 1 TABLET (1 MG TOTAL) BY MOUTH DAILY (AM)  Patient taking differently: Take 1 mg by mouth daily.   Vonna Drafts, MD 04/22/2023 Active Self  hydrocortisone (ANUSOL-HC) 2.5 % rectal cream [id number] No Place 1 Application rectally 2 (two) times daily. Vonna Drafts, MD Past Week Active Self  lidocaine (XYLOCAINE) 2 % solution [id number]  Use as directed 15 mLs in the mouth or throat every 4 (four) hours as needed for mouth pain. Alwyn Ren, MD  Active   mometasone-formoterol [facility]) 100-5 MCG/ACT Sandrea Matte [id number] No INHALE TWO PUFFS INTO THE LUNGS TWICE A DAY  Patient taking differently: Inhale 2 puffs into the lungs 2 (two) times daily.   Vonna Drafts, MD 04/22/2023 Active Self  Multiple Vitamin (MULTIVITAMIN WITH MINERALS) TABS tablet [id number] No Take 1 tablet by mouth daily. Vonna Drafts, MD 04/22/2023 Active Self  naltrexone (DEPADE) 50 MG tablet [id number] No TAKE 1/2 TABLET (25 MG TOTAL) BY MOUTH DAILY (AM)  Patient taking differently: Take 25 mg by mouth daily.   Vonna Drafts, MD 04/22/2023 Active Self  nystatin (MYCOSTATIN) 100000 UNIT/ML suspension [id number]  Take 5 mLs (500,000 Units total) by mouth 4 (four) times daily. Alwyn Ren,  MD  Active   OLANZapine (ZYPREXA) 5 MG tablet [id number] No Take 1 tablet (5 mg total) by mouth at bedtime. Future refills will need to come from psychiatry. Vonna Drafts, MD 04/21/2023 Active Self  pantoprazole (PROTONIX) 40 MG tablet [id number] No TAKE 1 TABLET (40 MG TOTAL) BY MOUTH  DAILY (AM)  Patient taking differently: Take 40 mg by mouth daily.   Vonna Drafts, MD 04/22/2023 Active Self  Pedialyte (PEDIALYTE) SOLN [id number]  Take 240 mLs by mouth every 6 (six) hours. Alwyn Ren, MD  Active   polyethylene glycol (MIRALAX / GLYCOLAX) 17 g packet [id number] No MIX AND DRINK BY MOUTH DAILY AS NEEDED FOR MODERATE CONSTIPATION  Patient not taking: Reported on 04/22/2023   Vonna Drafts, MD Not Taking Active Self  VENTOLIN HFA 108 (90 Base) MCG/ACT inhaler [id number] No Inhale 1-2 puffs into the lungs every 6 (six) hours as needed for wheezing or shortness of breath. Vonna Drafts, MD Past Week Active Self            Home Care and Equipment/Supplies: Were Home Health Services Ordered?: NA Any new equipment or medical supplies ordered?: NA  Functional Questionnaire: Do you need assistance with bathing/showering or dressing?: No Do you need assistance with meal preparation?: No Do you need assistance with eating?: No Do you have difficulty maintaining continence: No Do you need assistance with getting out of bed/getting out of a chair/moving?: No Do you have difficulty managing or taking your medications?: No  Follow up appointments reviewed: PCP Follow-up appointment confirmed?: Yes Date of PCP follow-up appointment?: 05/09/23 Follow-up Provider: [facility] Follow-up appointment confirmed?: NA Do you need transportation to your follow-up appointment?: No Do you understand care options if your condition(s) worsen?: Yes-patient verbalized understanding    SIGNATURE Karena Addison, LPN [facility] Nurse Health Advisor Direct Dial [phone]

## 2023-05-02 ENCOUNTER — Inpatient Hospital Stay: Payer: MEDICAID

## 2023-05-02 NOTE — Progress Notes (Deleted)
  SUBJECTIVE:   CHIEF COMPLAINT / HPI:   Hospital F/u -Admitted for hypokalemia, treated w/ K -also had oral candidiasis treated w/ diflucan -Norvasc and lisinopril d/c for soft BP  PERTINENT  PMH / PSH: ***  Past Medical History:  Diagnosis Date   Acute encephalopathy 03/10/2022   Adenomatous polyp of descending colon    Asthma    Encephalopathy acute 03/01/2022   Hematochezia    HTN (hypertension)    Hypokalemia 03/04/2022   Need for hepatitis C screening test 04/27/2022   Seizures [facility])     Patient Care Team: Vonna Drafts, MD as PCP - General (Family Medicine) OBJECTIVE:  There were no vitals taken for this visit. Physical Exam   ASSESSMENT/PLAN:  There are no diagnoses linked to this encounter. No follow-ups on file. Bess Kinds, MD 05/02/2023, 7:10 AM PGY-***, [facility] Family Medicine {    This will disappear when note is signed, click to select method of visit    :1}

## 2023-05-05 ENCOUNTER — Inpatient Hospital Stay: Payer: MEDICAID | Admitting: Family Medicine

## 2023-05-09 ENCOUNTER — Inpatient Hospital Stay: Payer: MEDICAID | Admitting: Family Medicine

## 2023-05-09 ENCOUNTER — Inpatient Hospital Stay: Payer: MEDICAID

## 2023-05-12 ENCOUNTER — Inpatient Hospital Stay: Payer: MEDICAID | Admitting: Family Medicine

## 2023-05-12 NOTE — Progress Notes (Deleted)
    SUBJECTIVE:   CHIEF COMPLAINT / HPI: Hospital f/u  Recommendations for Outpatient Follow-up:  Follow up with PCP in 1-2 weeks Please obtain BMP/CBC in one week Dr. Renaee Munda please follow-up on his blood pressure medications and BP as Norvasc and lisinopril was stopped during his hospital stay due to soft BP.  PERTINENT  PMH / PSH: HTN, Alcohol Use Disorder, Hx of Seizures  OBJECTIVE:   There were no vitals taken for this visit.  ***  ASSESSMENT/PLAN:   There are no diagnoses linked to this encounter. No follow-ups on file.  Celine Mans, MD [facility]

## 2023-05-22 ENCOUNTER — Ambulatory Visit (INDEPENDENT_AMBULATORY_CARE_PROVIDER_SITE_OTHER): Payer: Medicare Other | Admitting: Family Medicine

## 2023-05-22 ENCOUNTER — Encounter: Payer: Self-pay | Admitting: Family Medicine

## 2023-05-22 VITALS — BP 123/96 | HR 100 | Ht 66.0 in | Wt 123.8 lb

## 2023-05-22 DIAGNOSIS — E876 Hypokalemia: Secondary | ICD-10-CM | POA: Diagnosis present

## 2023-05-22 DIAGNOSIS — M545 Low back pain, unspecified: Secondary | ICD-10-CM

## 2023-05-22 DIAGNOSIS — I10 Essential (primary) hypertension: Secondary | ICD-10-CM | POA: Diagnosis not present

## 2023-05-22 DIAGNOSIS — R634 Abnormal weight loss: Secondary | ICD-10-CM | POA: Diagnosis not present

## 2023-05-22 DIAGNOSIS — G8929 Other chronic pain: Secondary | ICD-10-CM

## 2023-05-22 DIAGNOSIS — M549 Dorsalgia, unspecified: Secondary | ICD-10-CM | POA: Insufficient documentation

## 2023-05-22 MED ORDER — LISINOPRIL 5 MG PO TABS
5.0000 mg | ORAL_TABLET | Freq: Every day | ORAL | 0 refills | Status: DC
Start: 2023-05-22 — End: 2023-06-12

## 2023-05-22 MED ORDER — AMLODIPINE BESYLATE 10 MG PO TABS
10.0000 mg | ORAL_TABLET | Freq: Every day | ORAL | 0 refills | Status: DC
Start: 2023-05-22 — End: 2023-06-12

## 2023-05-22 NOTE — Assessment & Plan Note (Addendum)
Pt with around 20 pound weight loss since June 2024. Suspect multifactorial etiology related to chronic alcohol and other substance use, low mood, and limited physical activity at home. No B symptoms to suggest occult malignancy, no GI symptoms. Was prescribed meal supplements after hospital discharge in August, not drinking due to dairy intolerance. Recommended purchasing non-dairy meal supplements. Pt will be attending 30 day rehab program soon, hopefully this will help short and long term nutritional status. Will monitor and discuss further at next appointment.

## 2023-05-22 NOTE — Assessment & Plan Note (Signed)
BP: (!) 123/96 today. Poorly controlled. Goal of <130/80. Continue to work on healthy dietary habits and exercise. Follow up in 1 month.   Medication regimen: restart amlodipine 10mg  daily and lisinopril 5mg  daily

## 2023-05-22 NOTE — Assessment & Plan Note (Signed)
Low back pain for over 1 year without red flag symptoms in patient who appears to be quite sedentary at home. Tylenol helps relieve some of the pain, has not tried anything else. Not interested in physical therapy at this time. Will try alternating tylenol and ibuprofen as needed. Also provided with low back exercises handout. About to start 30 day rehab program, likely will have more physical activity while there. Will follow up at next appointment to reassess pain and consider referral to PT.

## 2023-05-22 NOTE — Progress Notes (Signed)
SUBJECTIVE:   CHIEF COMPLAINT / HPI:   Hospital follow up -admitted for hypokalemia and oral candidiasis from 8/17-8/18 -BP meds (lisinopril and amlodipine) were held due to soft pressures during admission -treated with diflucan, electrolyte repletion, and IVF -oral candidiasis has resolved, no ongoing symptoms  Back pain -wakes up at night in pain -has been going on for about 1 year -no fevers -tylenol eases pain some -has not tried anything else -pounding sensation -8/10 pain all the time -laying down helps -no bowel or bladder incontinence -has not done PT in the past, does not want to try this now -states he is going to 30-day rehab soon and will likely have to do more physical activity while there  Weight loss -was prescribed feeding supplement at discharge due to chronic weight loss -does not consume dairy due to lactose intolerance -willing to try meal supplement without dairy   PHQ-9 score 17 -answered never to question 9 -pt reports no concerns today -suspect patient's mother filled out form  PERTINENT  PMH / PSH: HTN, tobacco use, seizure, mood disorder, IDA  OBJECTIVE:   BP (!) 123/96   Pulse 100   Ht 5\' 6"  (1.676 m)   Wt 123 lb 12.8 oz (56.2 kg)   SpO2 100%   BMI 19.98 kg/m     Physical Exam Gen: thin young man sitting in exam room in NAD HEENT: moist mucus membranes, no plaques or lesions on tongue or in mouth CV: RRR, normal S1/S2, no murmurs, rubs, or gallops Pulm: CTAB, no wheezes, rales or rhonchi, normal WOB MSK: tenderness to palpation from low thoracic spine down, exam limited by tenderness, positive passive leg raise bilaterally, needs assistance to rise from chair Neuro: moving all extremities spontaneously Psych: appropriate mood, somewhat flat affect  ASSESSMENT/PLAN:   Essential hypertension BP: (!) 123/96 today. Poorly controlled. Goal of <130/80. Continue to work on healthy dietary habits and exercise. Follow up in 1 month.    Medication regimen: restart amlodipine 10mg  daily and lisinopril 5mg  daily   Hypokalemia Hypokalemic to 2.0 during recent hospital admission, normalized by day of discharge. Recheck BMP today.  Back pain Low back pain for over 1 year without red flag symptoms in patient who appears to be quite sedentary at home. Tylenol helps relieve some of the pain, has not tried anything else. Not interested in physical therapy at this time. Will try alternating tylenol and ibuprofen as needed. Also provided with low back exercises handout. About to start 30 day rehab program, likely will have more physical activity while there. Will follow up at next appointment to reassess pain and consider referral to PT.  Abnormal weight loss Pt with around 20 pound weight loss since June 2024. Suspect multifactorial etiology related to chronic alcohol and other substance use, low mood, and limited physical activity at home. No B symptoms to suggest occult malignancy, no GI symptoms. Was prescribed meal supplements after hospital discharge in August, not drinking due to dairy intolerance. Recommended purchasing non-dairy meal supplements. Pt will be attending 30 day rehab program soon, hopefully this will help short and long term nutritional status. Will monitor and discuss further at next appointment.     Patient Instructions  Thank you for coming in today!  Things we discussed today: Please restart your blood pressure medications (Lisinopril and Norvasc) as prescribed 2.  You can purchase non-dairy Ensure or Boost or other supplement drinks to increase your calorie intake. These are available at any pharmacy or grocery store.  3. Please alternate tylenol and ibuprofen to help with your back pain. Getting up and moving will also help relieve your symptoms. I have attached some exercises you can try at home. We can discuss physical therapy in the future if that's something you want to consider. I think it could be very  helpful.   Please make an appointment in about 1 month so we can check your blood pressure again.  Have a great day!  Lorayne Bender, MD [facility]

## 2023-05-22 NOTE — Assessment & Plan Note (Signed)
Hypokalemic to 2.0 during recent hospital admission, normalized by day of discharge. Recheck BMP today.

## 2023-05-22 NOTE — Patient Instructions (Addendum)
Thank you for coming in today!  Things we discussed today: Please restart your blood pressure medications (Lisinopril and Norvasc) as prescribed 2.  You can purchase non-dairy Ensure or Boost or other supplement drinks to increase your calorie intake. These are available at any pharmacy or grocery store. 3. Please alternate tylenol and ibuprofen to help with your back pain. Getting up and moving will also help relieve your symptoms. I have attached some exercises you can try at home. We can discuss physical therapy in the future if that's something you want to consider. I think it could be very helpful.   Please make an appointment in about 1 month so we can check your blood pressure again.  Have a great day!

## 2023-05-23 ENCOUNTER — Encounter: Payer: Self-pay | Admitting: Family Medicine

## 2023-05-23 LAB — BASIC METABOLIC PANEL
BUN/Creatinine Ratio: 14 (ref 9–20)
BUN: 9 mg/dL (ref 6–20)
CO2: 17 mmol/L — ABNORMAL LOW (ref 20–29)
Calcium: 8.9 mg/dL (ref 8.7–10.2)
Chloride: 99 mmol/L (ref 96–106)
Creatinine, Ser: 0.64 mg/dL — ABNORMAL LOW (ref 0.76–1.27)
Glucose: 51 mg/dL — ABNORMAL LOW (ref 70–99)
Potassium: 4.8 mmol/L (ref 3.5–5.2)
Sodium: 139 mmol/L (ref 134–144)
eGFR: 128 mL/min/{1.73_m2} (ref >59)

## 2023-05-25 ENCOUNTER — Emergency Department (HOSPITAL_COMMUNITY): Payer: Medicare Other

## 2023-05-25 ENCOUNTER — Inpatient Hospital Stay (HOSPITAL_COMMUNITY)
Admission: EM | Admit: 2023-05-25 | Discharge: 2023-06-12 | DRG: 870 | Disposition: A | Discharge status: Home Health | Payer: Medicare Other | Attending: Family Medicine | Admitting: Family Medicine

## 2023-05-25 ENCOUNTER — Encounter (HOSPITAL_COMMUNITY): Payer: Self-pay

## 2023-05-25 ENCOUNTER — Inpatient Hospital Stay (HOSPITAL_COMMUNITY): Payer: Medicare Other

## 2023-05-25 DIAGNOSIS — I469 Cardiac arrest, cause unspecified: Principal | ICD-10-CM | POA: Diagnosis present

## 2023-05-25 DIAGNOSIS — F1020 Alcohol dependence, uncomplicated: Secondary | ICD-10-CM | POA: Diagnosis present

## 2023-05-25 DIAGNOSIS — K7200 Acute and subacute hepatic failure without coma: Secondary | ICD-10-CM | POA: Diagnosis present

## 2023-05-25 DIAGNOSIS — G40401 Other generalized epilepsy and epileptic syndromes, not intractable, with status epilepticus: Secondary | ICD-10-CM | POA: Diagnosis present

## 2023-05-25 DIAGNOSIS — D61818 Other pancytopenia: Secondary | ICD-10-CM | POA: Diagnosis present

## 2023-05-25 DIAGNOSIS — M6282 Rhabdomyolysis: Secondary | ICD-10-CM | POA: Diagnosis present

## 2023-05-25 DIAGNOSIS — M549 Dorsalgia, unspecified: Secondary | ICD-10-CM | POA: Diagnosis not present

## 2023-05-25 DIAGNOSIS — G9341 Metabolic encephalopathy: Secondary | ICD-10-CM | POA: Diagnosis present

## 2023-05-25 DIAGNOSIS — K648 Other hemorrhoids: Secondary | ICD-10-CM | POA: Diagnosis present

## 2023-05-25 DIAGNOSIS — I10 Essential (primary) hypertension: Secondary | ICD-10-CM | POA: Diagnosis not present

## 2023-05-25 DIAGNOSIS — G8324 Monoplegia of upper limb affecting left nondominant side: Secondary | ICD-10-CM | POA: Diagnosis not present

## 2023-05-25 DIAGNOSIS — K644 Residual hemorrhoidal skin tags: Secondary | ICD-10-CM | POA: Diagnosis not present

## 2023-05-25 DIAGNOSIS — I5181 Takotsubo syndrome: Secondary | ICD-10-CM | POA: Diagnosis present

## 2023-05-25 DIAGNOSIS — R739 Hyperglycemia, unspecified: Secondary | ICD-10-CM | POA: Diagnosis present

## 2023-05-25 DIAGNOSIS — R34 Anuria and oliguria: Secondary | ICD-10-CM | POA: Diagnosis not present

## 2023-05-25 DIAGNOSIS — R609 Edema, unspecified: Secondary | ICD-10-CM | POA: Diagnosis not present

## 2023-05-25 DIAGNOSIS — R4781 Slurred speech: Secondary | ICD-10-CM | POA: Diagnosis not present

## 2023-05-25 DIAGNOSIS — G40901 Epilepsy, unspecified, not intractable, with status epilepticus: Secondary | ICD-10-CM

## 2023-05-25 DIAGNOSIS — I96 Gangrene, not elsewhere classified: Secondary | ICD-10-CM | POA: Diagnosis not present

## 2023-05-25 DIAGNOSIS — Z781 Physical restraint status: Secondary | ICD-10-CM

## 2023-05-25 DIAGNOSIS — F17210 Nicotine dependence, cigarettes, uncomplicated: Secondary | ICD-10-CM | POA: Diagnosis present

## 2023-05-25 DIAGNOSIS — K661 Hemoperitoneum: Secondary | ICD-10-CM | POA: Diagnosis not present

## 2023-05-25 DIAGNOSIS — H1130 Conjunctival hemorrhage, unspecified eye: Secondary | ICD-10-CM | POA: Diagnosis not present

## 2023-05-25 DIAGNOSIS — J9601 Acute respiratory failure with hypoxia: Secondary | ICD-10-CM | POA: Diagnosis present

## 2023-05-25 DIAGNOSIS — J690 Pneumonitis due to inhalation of food and vomit: Secondary | ICD-10-CM | POA: Diagnosis present

## 2023-05-25 DIAGNOSIS — R200 Anesthesia of skin: Secondary | ICD-10-CM | POA: Diagnosis not present

## 2023-05-25 DIAGNOSIS — Z8673 Personal history of transient ischemic attack (TIA), and cerebral infarction without residual deficits: Secondary | ICD-10-CM

## 2023-05-25 DIAGNOSIS — Z79899 Other long term (current) drug therapy: Secondary | ICD-10-CM

## 2023-05-25 DIAGNOSIS — R570 Cardiogenic shock: Secondary | ICD-10-CM | POA: Diagnosis present

## 2023-05-25 DIAGNOSIS — L89629 Pressure ulcer of left heel, unspecified stage: Secondary | ICD-10-CM | POA: Insufficient documentation

## 2023-05-25 DIAGNOSIS — E876 Hypokalemia: Secondary | ICD-10-CM | POA: Diagnosis not present

## 2023-05-25 DIAGNOSIS — R569 Unspecified convulsions: Secondary | ICD-10-CM

## 2023-05-25 DIAGNOSIS — D6489 Other specified anemias: Secondary | ICD-10-CM | POA: Diagnosis present

## 2023-05-25 DIAGNOSIS — G40909 Epilepsy, unspecified, not intractable, without status epilepticus: Secondary | ICD-10-CM

## 2023-05-25 DIAGNOSIS — J9602 Acute respiratory failure with hypercapnia: Secondary | ICD-10-CM | POA: Diagnosis present

## 2023-05-25 DIAGNOSIS — Z888 Allergy status to other drugs, medicaments and biological substances status: Secondary | ICD-10-CM

## 2023-05-25 DIAGNOSIS — D684 Acquired coagulation factor deficiency: Secondary | ICD-10-CM | POA: Diagnosis present

## 2023-05-25 DIAGNOSIS — I82451 Acute embolism and thrombosis of right peroneal vein: Secondary | ICD-10-CM | POA: Diagnosis not present

## 2023-05-25 DIAGNOSIS — F39 Unspecified mood [affective] disorder: Secondary | ICD-10-CM | POA: Diagnosis present

## 2023-05-25 DIAGNOSIS — A419 Sepsis, unspecified organism: Secondary | ICD-10-CM | POA: Diagnosis present

## 2023-05-25 DIAGNOSIS — E874 Mixed disorder of acid-base balance: Secondary | ICD-10-CM | POA: Diagnosis present

## 2023-05-25 DIAGNOSIS — R6521 Severe sepsis with septic shock: Secondary | ICD-10-CM | POA: Diagnosis present

## 2023-05-25 DIAGNOSIS — E872 Acidosis, unspecified: Secondary | ICD-10-CM

## 2023-05-25 DIAGNOSIS — D65 Disseminated intravascular coagulation [defibrination syndrome]: Secondary | ICD-10-CM | POA: Diagnosis present

## 2023-05-25 DIAGNOSIS — Z862 Personal history of diseases of the blood and blood-forming organs and certain disorders involving the immune mechanism: Secondary | ICD-10-CM

## 2023-05-25 DIAGNOSIS — R3129 Other microscopic hematuria: Secondary | ICD-10-CM | POA: Diagnosis present

## 2023-05-25 DIAGNOSIS — J90 Pleural effusion, not elsewhere classified: Secondary | ICD-10-CM | POA: Diagnosis not present

## 2023-05-25 DIAGNOSIS — I5021 Acute systolic (congestive) heart failure: Secondary | ICD-10-CM | POA: Diagnosis not present

## 2023-05-25 DIAGNOSIS — Z5986 Financial insecurity: Secondary | ICD-10-CM

## 2023-05-25 DIAGNOSIS — D689 Coagulation defect, unspecified: Secondary | ICD-10-CM | POA: Insufficient documentation

## 2023-05-25 DIAGNOSIS — Z886 Allergy status to analgesic agent status: Secondary | ICD-10-CM

## 2023-05-25 DIAGNOSIS — J45909 Unspecified asthma, uncomplicated: Secondary | ICD-10-CM

## 2023-05-25 DIAGNOSIS — R680 Hypothermia, not associated with low environmental temperature: Secondary | ICD-10-CM | POA: Diagnosis present

## 2023-05-25 DIAGNOSIS — Z91199 Patient's noncompliance with other medical treatment and regimen due to unspecified reason: Secondary | ICD-10-CM

## 2023-05-25 DIAGNOSIS — N170 Acute kidney failure with tubular necrosis: Secondary | ICD-10-CM | POA: Diagnosis present

## 2023-05-25 DIAGNOSIS — R001 Bradycardia, unspecified: Secondary | ICD-10-CM | POA: Diagnosis not present

## 2023-05-25 DIAGNOSIS — R29705 NIHSS score 5: Secondary | ICD-10-CM | POA: Diagnosis not present

## 2023-05-25 DIAGNOSIS — Z8719 Personal history of other diseases of the digestive system: Secondary | ICD-10-CM

## 2023-05-25 DIAGNOSIS — R011 Cardiac murmur, unspecified: Secondary | ICD-10-CM | POA: Diagnosis not present

## 2023-05-25 DIAGNOSIS — R490 Dysphonia: Secondary | ICD-10-CM | POA: Diagnosis not present

## 2023-05-25 DIAGNOSIS — N179 Acute kidney failure, unspecified: Secondary | ICD-10-CM | POA: Insufficient documentation

## 2023-05-25 DIAGNOSIS — I82452 Acute embolism and thrombosis of left peroneal vein: Secondary | ICD-10-CM | POA: Diagnosis not present

## 2023-05-25 DIAGNOSIS — L89626 Pressure-induced deep tissue damage of left heel: Secondary | ICD-10-CM | POA: Diagnosis not present

## 2023-05-25 LAB — SALICYLATE LEVEL: Salicylate Lvl: 8.1 mg/dL (ref 7.0–30.0)

## 2023-05-25 LAB — CBC WITH DIFFERENTIAL/PLATELET
Abs Immature Granulocytes: 0.61 10*3/uL — ABNORMAL HIGH (ref 0.00–0.07)
Basophils Absolute: 0 10*3/uL (ref 0.0–0.1)
Basophils Relative: 1 %
Eosinophils Absolute: 0 10*3/uL (ref 0.0–0.5)
Eosinophils Relative: 0 %
HCT: 37.3 % — ABNORMAL LOW (ref 39.0–52.0)
Hemoglobin: 11.3 g/dL — ABNORMAL LOW (ref 13.0–17.0)
Immature Granulocytes: 8 %
Lymphocytes Relative: 27 %
Lymphs Abs: 2.2 10*3/uL (ref 0.7–4.0)
MCH: 33.1 pg (ref 26.0–34.0)
MCHC: 30.3 g/dL (ref 30.0–36.0)
MCV: 109.4 fL — ABNORMAL HIGH (ref 80.0–100.0)
Monocytes Absolute: 0.4 10*3/uL (ref 0.1–1.0)
Monocytes Relative: 4 %
Neutro Abs: 4.8 10*3/uL (ref 1.7–7.7)
Neutrophils Relative %: 60 %
Platelets: 122 10*3/uL — ABNORMAL LOW (ref 150–400)
RBC: 3.41 MIL/uL — ABNORMAL LOW (ref 4.22–5.81)
RDW: 16.2 % — ABNORMAL HIGH (ref 11.5–15.5)
WBC: 8 10*3/uL (ref 4.0–10.5)
nRBC: 0.6 % — ABNORMAL HIGH (ref 0.0–0.2)

## 2023-05-25 LAB — COMPREHENSIVE METABOLIC PANEL
ALT: 1659 U/L — ABNORMAL HIGH (ref 0–44)
AST: 3849 U/L — ABNORMAL HIGH (ref 15–41)
Albumin: 2.1 g/dL — ABNORMAL LOW (ref 3.5–5.0)
Alkaline Phosphatase: 46 U/L (ref 38–126)
Anion gap: 31 — ABNORMAL HIGH (ref 5–15)
BUN: 11 mg/dL (ref 6–20)
CO2: 8 mmol/L — ABNORMAL LOW (ref 22–32)
Calcium: 7.2 mg/dL — ABNORMAL LOW (ref 8.9–10.3)
Chloride: 101 mmol/L (ref 98–111)
Creatinine, Ser: 2.38 mg/dL — ABNORMAL HIGH (ref 0.61–1.24)
GFR, Estimated: 36 mL/min — ABNORMAL LOW (ref >60)
Glucose, Bld: 144 mg/dL — ABNORMAL HIGH (ref 70–99)
Potassium: 4.5 mmol/L (ref 3.5–5.1)
Sodium: 140 mmol/L (ref 135–145)
Total Bilirubin: 1.6 mg/dL — ABNORMAL HIGH (ref 0.3–1.2)
Total Protein: 4.1 g/dL — ABNORMAL LOW (ref 6.5–8.1)

## 2023-05-25 LAB — I-STAT CHEM 8, ED
BUN: 12 mg/dL (ref 6–20)
Calcium, Ion: 0.9 mmol/L — ABNORMAL LOW (ref 1.15–1.40)
Chloride: 104 mmol/L (ref 98–111)
Creatinine, Ser: 2 mg/dL — ABNORMAL HIGH (ref 0.61–1.24)
Glucose, Bld: 130 mg/dL — ABNORMAL HIGH (ref 70–99)
HCT: 36 % — ABNORMAL LOW (ref 39.0–52.0)
Hemoglobin: 12.2 g/dL — ABNORMAL LOW (ref 13.0–17.0)
Potassium: 4.4 mmol/L (ref 3.5–5.1)
Sodium: 136 mmol/L (ref 135–145)
TCO2: 12 mmol/L — ABNORMAL LOW (ref 22–32)

## 2023-05-25 LAB — I-STAT ARTERIAL BLOOD GAS, ED
Acid-base deficit: 23 mmol/L — ABNORMAL HIGH (ref 0.0–2.0)
Bicarbonate: 11.1 mmol/L — ABNORMAL LOW (ref 20.0–28.0)
Calcium, Ion: 0.98 mmol/L — ABNORMAL LOW (ref 1.15–1.40)
HCT: 37 % — ABNORMAL LOW (ref 39.0–52.0)
Hemoglobin: 12.6 g/dL — ABNORMAL LOW (ref 13.0–17.0)
O2 Saturation: 100 %
Patient temperature: 93.8
Potassium: 3.7 mmol/L (ref 3.5–5.1)
Sodium: 135 mmol/L (ref 135–145)
TCO2: 13 mmol/L — ABNORMAL LOW (ref 22–32)
pCO2 arterial: 56.2 mmHg — ABNORMAL HIGH (ref 32–48)
pH, Arterial: 6.885 — CL (ref 7.35–7.45)
pO2, Arterial: 379 mmHg — ABNORMAL HIGH (ref 83–108)

## 2023-05-25 LAB — BLOOD GAS, ARTERIAL
Acid-base deficit: 17.7 mmol/L — ABNORMAL HIGH (ref 0.0–2.0)
Bicarbonate: 10.6 mmol/L — ABNORMAL LOW (ref 20.0–28.0)
O2 Saturation: 99.1 %
Patient temperature: 35.4
pCO2 arterial: 32 mmHg (ref 32–48)
pH, Arterial: 7.12 — CL (ref 7.35–7.45)
pO2, Arterial: 88 mmHg (ref 83–108)

## 2023-05-25 LAB — ACETAMINOPHEN LEVEL: Acetaminophen (Tylenol), Serum: 16 ug/mL (ref 10–30)

## 2023-05-25 LAB — TROPONIN I (HIGH SENSITIVITY)
Troponin I (High Sensitivity): 35 ng/L — ABNORMAL HIGH (ref <18)
Troponin I (High Sensitivity): 54 ng/L — ABNORMAL HIGH (ref <18)

## 2023-05-25 LAB — GLUCOSE, CAPILLARY
Glucose-Capillary: 236 mg/dL — ABNORMAL HIGH (ref 70–99)
Glucose-Capillary: 272 mg/dL — ABNORMAL HIGH (ref 70–99)
Glucose-Capillary: 400 mg/dL — ABNORMAL HIGH (ref 70–99)
Glucose-Capillary: 427 mg/dL — ABNORMAL HIGH (ref 70–99)

## 2023-05-25 LAB — I-STAT CG4 LACTIC ACID, ED
Lactic Acid, Venous: 13.8 mmol/L (ref 0.5–1.9)
Lactic Acid, Venous: 14.3 mmol/L (ref 0.5–1.9)

## 2023-05-25 LAB — CBG MONITORING, ED: Glucose-Capillary: 87 mg/dL (ref 70–99)

## 2023-05-25 LAB — VALPROIC ACID LEVEL: Valproic Acid Lvl: 25 ug/mL — ABNORMAL LOW (ref 50.0–100.0)

## 2023-05-25 LAB — PROTIME-INR
INR: >10 (ref 0.8–1.2)
Prothrombin Time: >90 seconds — ABNORMAL HIGH (ref 11.4–15.2)

## 2023-05-25 LAB — PROCALCITONIN: Procalcitonin: 0.6 ng/mL

## 2023-05-25 LAB — ETHANOL: Alcohol, Ethyl (B): <10 mg/dL (ref <10)

## 2023-05-25 LAB — MAGNESIUM: Magnesium: 1.8 mg/dL (ref 1.7–2.4)

## 2023-05-25 LAB — CK: Total CK: 237 U/L (ref 49–397)

## 2023-05-25 LAB — MRSA NEXT GEN BY PCR, NASAL: MRSA by PCR Next Gen: NOT DETECTED

## 2023-05-25 MED ORDER — FENTANYL BOLUS VIA INFUSION
50.0000 ug | INTRAVENOUS | Status: DC | PRN
  Administered 2023-05-26 (×2): 100 ug via INTRAVENOUS
  Administered 2023-05-26: 50 ug via INTRAVENOUS
  Administered 2023-05-26: 100 ug via INTRAVENOUS

## 2023-05-25 MED ORDER — SODIUM CHLORIDE 0.9 % IV BOLUS
1000.0000 mL | Freq: Once | INTRAVENOUS | Status: AC
  Administered 2023-05-25: 1000 mL via INTRAVENOUS

## 2023-05-25 MED ORDER — ACETAMINOPHEN 650 MG RE SUPP: 650.0000 mg | RECTAL | Status: DC | PRN

## 2023-05-25 MED ORDER — ACETAMINOPHEN 160 MG/5ML PO SOLN: 650.0000 mg | ORAL | Status: DC | PRN

## 2023-05-25 MED ORDER — ACETAMINOPHEN 325 MG PO TABS: 650.0000 mg | ORAL_TABLET | ORAL | Status: DC | PRN

## 2023-05-25 MED ORDER — ETOMIDATE 2 MG/ML IV SOLN
INTRAVENOUS | Status: AC
  Filled 2023-05-25: qty 20

## 2023-05-25 MED ORDER — SODIUM CHLORIDE 0.9 % IV SOLN
2.0000 g | Freq: Once | INTRAVENOUS | Status: AC
  Administered 2023-05-25: 2 g via INTRAVENOUS
  Filled 2023-05-25: qty 12.5

## 2023-05-25 MED ORDER — ENOXAPARIN SODIUM 40 MG/0.4ML IJ SOSY: 40.0000 mg | PREFILLED_SYRINGE | INTRAMUSCULAR | Status: DC

## 2023-05-25 MED ORDER — BUSPIRONE HCL 15 MG PO TABS: 30.0000 mg | ORAL_TABLET | Freq: Three times a day (TID) | ORAL | Status: AC | PRN

## 2023-05-25 MED ORDER — IOHEXOL 350 MG/ML SOLN
75.0000 mL | Freq: Once | INTRAVENOUS | Status: AC | PRN
  Administered 2023-05-25: 75 mL via INTRAVENOUS

## 2023-05-25 MED ORDER — FENTANYL CITRATE PF 50 MCG/ML IJ SOSY
PREFILLED_SYRINGE | INTRAMUSCULAR | Status: AC
  Filled 2023-05-25: qty 2

## 2023-05-25 MED ORDER — BUDESONIDE 0.5 MG/2ML IN SUSP
0.5000 mg | Freq: Two times a day (BID) | RESPIRATORY_TRACT | Status: DC
  Administered 2023-05-25 – 2023-06-12 (×27): 0.5 mg via RESPIRATORY_TRACT
  Filled 2023-05-25 (×34): qty 2

## 2023-05-25 MED ORDER — KETAMINE HCL 50 MG/5ML IJ SOSY
PREFILLED_SYRINGE | INTRAMUSCULAR | Status: AC
  Filled 2023-05-25: qty 10

## 2023-05-25 MED ORDER — THIAMINE HCL 100 MG/ML IJ SOLN
100.0000 mg | Freq: Once | INTRAMUSCULAR | Status: AC
  Administered 2023-05-25: 100 mg via INTRAVENOUS
  Filled 2023-05-25: qty 2

## 2023-05-25 MED ORDER — ETOMIDATE 2 MG/ML IV SOLN
INTRAVENOUS | Status: AC | PRN
Start: 2023-05-25 — End: 2023-05-25
  Administered 2023-05-25: 15 mg via INTRAVENOUS

## 2023-05-25 MED ORDER — ONDANSETRON HCL 4 MG/2ML IJ SOLN: 4.0000 mg | Freq: Four times a day (QID) | INTRAMUSCULAR | Status: DC | PRN

## 2023-05-25 MED ORDER — VALPROATE SODIUM 100 MG/ML IV SOLN
1500.0000 mg | Freq: Once | INTRAVENOUS | Status: AC
  Administered 2023-05-25: 1500 mg via INTRAVENOUS
  Filled 2023-05-25: qty 15

## 2023-05-25 MED ORDER — ACETAMINOPHEN 650 MG RE SUPP: 650.0000 mg | RECTAL | Status: DC

## 2023-05-25 MED ORDER — SODIUM BICARBONATE 8.4 % IV SOLN
50.0000 meq | Freq: Once | INTRAVENOUS | Status: AC
  Administered 2023-05-25: 50 meq via INTRAVENOUS
  Filled 2023-05-25: qty 50

## 2023-05-25 MED ORDER — LACTATED RINGERS IV SOLN: INTRAVENOUS | Status: DC

## 2023-05-25 MED ORDER — POLYETHYLENE GLYCOL 3350 17 G PO PACK
17.0000 g | PACK | Freq: Every day | ORAL | Status: DC
  Administered 2023-05-26: 17 g
  Filled 2023-05-25: qty 1

## 2023-05-25 MED ORDER — SODIUM CHLORIDE 0.9 % IV SOLN: INTRAVENOUS | Status: DC | PRN

## 2023-05-25 MED ORDER — VALPROATE SODIUM 100 MG/ML IV SOLN
500.0000 mg | Freq: Three times a day (TID) | INTRAVENOUS | Status: DC
  Administered 2023-05-25 – 2023-05-26 (×2): 500 mg via INTRAVENOUS
  Filled 2023-05-25 (×7): qty 5

## 2023-05-25 MED ORDER — ROCURONIUM BROMIDE 10 MG/ML (PF) SYRINGE
PREFILLED_SYRINGE | INTRAVENOUS | Status: AC
  Filled 2023-05-25: qty 10

## 2023-05-25 MED ORDER — ARFORMOTEROL TARTRATE 15 MCG/2ML IN NEBU
15.0000 ug | INHALATION_SOLUTION | Freq: Two times a day (BID) | RESPIRATORY_TRACT | Status: DC
  Administered 2023-05-25 – 2023-05-27 (×5): 15 ug via RESPIRATORY_TRACT
  Filled 2023-05-25 (×6): qty 2

## 2023-05-25 MED ORDER — EPINEPHRINE HCL 5 MG/250ML IV SOLN IN NS
0.5000 ug/min | INTRAVENOUS | Status: DC
  Administered 2023-05-25: 10 ug/min via INTRAVENOUS
  Administered 2023-05-25: 14 ug/min via INTRAVENOUS
  Administered 2023-05-26: 20 ug/min via INTRAVENOUS
  Administered 2023-05-26 – 2023-05-27 (×2): 8 ug/min via INTRAVENOUS
  Filled 2023-05-25 (×6): qty 250

## 2023-05-25 MED ORDER — POLYETHYLENE GLYCOL 3350 17 G PO PACK: 17.0000 g | PACK | Freq: Every day | ORAL | Status: DC | PRN

## 2023-05-25 MED ORDER — FENTANYL 2500MCG IN NS 250ML (10MCG/ML) PREMIX INFUSION
50.0000 ug/h | INTRAVENOUS | Status: DC
  Administered 2023-05-25: 50 ug/h via INTRAVENOUS
  Administered 2023-05-26: 200 ug/h via INTRAVENOUS
  Filled 2023-05-25: qty 250

## 2023-05-25 MED ORDER — NOREPINEPHRINE 4 MG/250ML-% IV SOLN
INTRAVENOUS | Status: AC
  Filled 2023-05-25: qty 250

## 2023-05-25 MED ORDER — EPINEPHRINE 1 MG/10ML IJ SOSY
PREFILLED_SYRINGE | INTRAMUSCULAR | Status: AC | PRN
Start: 2023-05-25 — End: 2023-05-25
  Administered 2023-05-25: 1 mg via INTRAVENOUS

## 2023-05-25 MED ORDER — SODIUM CHLORIDE 0.9 % IV SOLN
250.0000 mL | INTRAVENOUS | Status: DC
  Administered 2023-05-25 – 2023-05-28 (×2): 250 mL via INTRAVENOUS

## 2023-05-25 MED ORDER — MIDAZOLAM HCL 2 MG/2ML IJ SOLN
INTRAMUSCULAR | Status: AC
  Filled 2023-05-25: qty 2

## 2023-05-25 MED ORDER — IPRATROPIUM-ALBUTEROL 0.5-2.5 (3) MG/3ML IN SOLN
3.0000 mL | RESPIRATORY_TRACT | Status: DC | PRN
  Administered 2023-05-28 – 2023-06-01 (×2): 3 mL via RESPIRATORY_TRACT
  Filled 2023-05-25: qty 3

## 2023-05-25 MED ORDER — INSULIN REGULAR(HUMAN) IN NACL 100-0.9 UT/100ML-% IV SOLN
INTRAVENOUS | Status: DC
  Administered 2023-05-26: 7 [IU]/h via INTRAVENOUS
  Administered 2023-05-26: 3.8 [IU]/h via INTRAVENOUS
  Filled 2023-05-25 (×2): qty 100

## 2023-05-25 MED ORDER — PIPERACILLIN-TAZOBACTAM 3.375 G IVPB
3.3750 g | Freq: Three times a day (TID) | INTRAVENOUS | Status: DC
  Administered 2023-05-26 – 2023-05-29 (×11): 3.375 g via INTRAVENOUS
  Filled 2023-05-25 (×11): qty 50

## 2023-05-25 MED ORDER — SODIUM BICARBONATE 8.4 % IV SOLN
INTRAVENOUS | Status: AC | PRN
  Administered 2023-05-25: 50 meq via INTRAVENOUS

## 2023-05-25 MED ORDER — FENTANYL BOLUS VIA INFUSION: 50.0000 ug | INTRAVENOUS | Status: DC | PRN

## 2023-05-25 MED ORDER — STERILE WATER FOR INJECTION IV SOLN
INTRAVENOUS | Status: DC
Start: 2023-05-25 — End: 2023-05-25

## 2023-05-25 MED ORDER — ACETAMINOPHEN 160 MG/5ML PO SOLN
650.0000 mg | ORAL | Status: DC
  Administered 2023-05-25 – 2023-05-26 (×2): 650 mg
  Filled 2023-05-25 (×2): qty 20.3

## 2023-05-25 MED ORDER — VANCOMYCIN HCL IN DEXTROSE 1-5 GM/200ML-% IV SOLN
1000.0000 mg | Freq: Once | INTRAVENOUS | Status: AC
  Administered 2023-05-25: 1000 mg via INTRAVENOUS
  Filled 2023-05-25: qty 200

## 2023-05-25 MED ORDER — ROCURONIUM BROMIDE 50 MG/5ML IV SOLN
INTRAVENOUS | Status: AC | PRN
  Administered 2023-05-25: 70 mg via INTRAVENOUS

## 2023-05-25 MED ORDER — MAGNESIUM SULFATE 2 GM/50ML IV SOLN
2.0000 g | Freq: Once | INTRAVENOUS | Status: AC
  Administered 2023-05-25: 2 g via INTRAVENOUS
  Filled 2023-05-25: qty 50

## 2023-05-25 MED ORDER — FENTANYL 2500MCG IN NS 250ML (10MCG/ML) PREMIX INFUSION
50.0000 ug/h | INTRAVENOUS | Status: DC
  Administered 2023-05-25: 50 ug/h via INTRAVENOUS
  Filled 2023-05-25: qty 250

## 2023-05-25 MED ORDER — CALCIUM GLUCONATE-NACL 2-0.675 GM/100ML-% IV SOLN
2.0000 g | Freq: Once | INTRAVENOUS | Status: AC
  Administered 2023-05-25: 2000 mg via INTRAVENOUS
  Filled 2023-05-25 (×2): qty 100

## 2023-05-25 MED ORDER — DEXTROSE 50 % IV SOLN: 0.0000 mL | INTRAVENOUS | Status: DC | PRN

## 2023-05-25 MED ORDER — SUCCINYLCHOLINE CHLORIDE 200 MG/10ML IV SOSY
PREFILLED_SYRINGE | INTRAVENOUS | Status: AC
  Filled 2023-05-25: qty 10

## 2023-05-25 MED ORDER — ACETAMINOPHEN 325 MG PO TABS
650.0000 mg | ORAL_TABLET | ORAL | Status: DC
  Administered 2023-05-26 (×2): 650 mg via ORAL

## 2023-05-25 MED ORDER — VASOPRESSIN 20 UNITS/100 ML INFUSION FOR SHOCK
0.0000 [IU]/min | INTRAVENOUS | Status: DC
  Administered 2023-05-25 – 2023-05-26 (×4): 0.03 [IU]/min via INTRAVENOUS
  Administered 2023-05-27: 0.04 [IU]/min via INTRAVENOUS
  Administered 2023-05-27: 0.03 [IU]/min via INTRAVENOUS
  Administered 2023-05-28 (×3): 0.04 [IU]/min via INTRAVENOUS
  Filled 2023-05-25 (×9): qty 100

## 2023-05-25 MED ORDER — LACTATED RINGERS IV BOLUS
2000.0000 mL | Freq: Once | INTRAVENOUS | Status: AC
  Administered 2023-05-25: 2000 mL via INTRAVENOUS

## 2023-05-25 MED ORDER — NOREPINEPHRINE 4 MG/250ML-% IV SOLN
0.0000 ug/min | INTRAVENOUS | Status: DC
  Administered 2023-05-25: 38 ug/min via INTRAVENOUS
  Administered 2023-05-25 (×2): 40 ug/min via INTRAVENOUS
  Administered 2023-05-25: 10 ug/min via INTRAVENOUS
  Administered 2023-05-26: 40 ug/min via INTRAVENOUS
  Filled 2023-05-25 (×2): qty 500
  Filled 2023-05-25 (×2): qty 250

## 2023-05-25 MED ORDER — DOCUSATE SODIUM 50 MG/5ML PO LIQD
100.0000 mg | Freq: Two times a day (BID) | ORAL | Status: DC
  Administered 2023-05-26: 100 mg
  Filled 2023-05-25 (×2): qty 10

## 2023-05-25 MED ORDER — FENTANYL CITRATE PF 50 MCG/ML IJ SOSY: 50.0000 ug | PREFILLED_SYRINGE | Freq: Once | INTRAMUSCULAR | Status: DC

## 2023-05-25 MED ORDER — SODIUM BICARBONATE 8.4 % IV SOLN
INTRAVENOUS | Status: DC
  Filled 2023-05-25 (×2): qty 1000

## 2023-05-25 MED ORDER — MAGNESIUM SULFATE 2 GM/50ML IV SOLN
2.0000 g | Freq: Once | INTRAVENOUS | Status: AC | PRN
  Filled 2023-05-25: qty 50

## 2023-05-25 MED ORDER — FAMOTIDINE 20 MG PO TABS
20.0000 mg | ORAL_TABLET | Freq: Two times a day (BID) | ORAL | Status: DC
  Administered 2023-05-25: 20 mg
  Filled 2023-05-25: qty 1

## 2023-05-25 MED ORDER — DOCUSATE SODIUM 50 MG/5ML PO LIQD: 100.0000 mg | Freq: Two times a day (BID) | ORAL | Status: DC | PRN

## 2023-05-25 MED ORDER — SODIUM BICARBONATE 8.4 % IV SOLN
100.0000 meq | Freq: Once | INTRAVENOUS | Status: AC
  Administered 2023-05-25: 100 meq via INTRAVENOUS
  Filled 2023-05-25: qty 50

## 2023-05-25 MED ORDER — FENTANYL CITRATE PF 50 MCG/ML IJ SOSY
50.0000 ug | PREFILLED_SYRINGE | Freq: Once | INTRAMUSCULAR | Status: AC
  Administered 2023-05-25: 50 ug via INTRAVENOUS
  Filled 2023-05-25: qty 1

## 2023-05-25 NOTE — Progress Notes (Signed)
ED Pharmacy Antibiotic Sign Off An antibiotic consult was received from an ED provider for vancomycin per pharmacy dosing for sepsis. A chart review was completed to assess appropriateness.  A single dose of cefepime 2000 mg was placed by the ED provider.   The following one time order(s) were placed per pharmacy consult:  vancomycin 1000 mg x 1 dose  Further antibiotic and/or antibiotic pharmacy consults should be ordered by the admitting provider if indicated.   Thank you for allowing pharmacy to be a part of this patient's care.   Delmar Landau, PharmD, BCPS 05/25/2023 2:13 PM ED Clinical Pharmacist -  [phone]

## 2023-05-25 NOTE — ED Triage Notes (Signed)
Pt bib ems from home; witnessed seizure; pt's mom reports pt flaccid, eyes moving back and forth; on ems arrival bradycardic 20s , systolic 40 palp; 2 more seizures with ems; pulses lost, 2 rounds of CPR done;  IO placed in LLE; epi drip started, 50 cc, NS 100 mls given pta; pupils unequal 4L and 2R; rhonchi noted; hx hypokalemia, seizures, anemia; complaint with meds; ; no purposeful movement with ems; pale, cool,, diaphoretic; cbg 112

## 2023-05-25 NOTE — Progress Notes (Addendum)
Per Nurse, Pt is receiving a lot of care to include other task and procedures and not available at this time. Pt will also be going to CT and currently not available. EEG will follow up as schedule permits

## 2023-05-25 NOTE — Progress Notes (Addendum)
eLink Physician-Brief Progress Note Patient Name: Timothy Hubbard DOB: [birth year] MRN: [id number]   Date of Service  05/25/2023  HPI/Events of Note  Bedside RN reports patient reaching for his endotracheal tube.  Requesting for restraints.  eICU Interventions  Restraints ordered.   11:55 PM Patient is hyperglycemic with blood glucose levels in the 400s.  Will start on insulin drip for glycemic control.  Blood glucose checks per protocol.   Intervention Category Evaluation Type: Other  Carilyn Goodpasture 05/25/2023, 11:42 PM

## 2023-05-25 NOTE — Progress Notes (Signed)
LTM EEG hooked up and running - no initial skin breakdown - push button tested - [facility] monitoring.

## 2023-05-25 NOTE — ED Provider Notes (Signed)
[place] EMERGENCY DEPARTMENT AT [facility] Provider Note   CSN: [id number] Arrival date & time: 05/25/23  1228     History  No chief complaint on file.   Timothy Hubbard is a 32 y.o. male.  32 year old male with past medical history of seizures and psychiatric disorder presenting to the emergency department today after apparent seizure at home.  When medics arrived the patient was unresponsive.  He was brought to the emergency department at time.  On the way to the emergency department he did have some episodes of bradycardia where his heart rate went down to the 30s and they lost pulses.  The patient received 1 round of CPR with return of spontaneous circulation.  He was started on epinephrine infusion.  He was brought to the emergency department at time for further evaluation.  His blood sugar with medics was 118.        Home Medications Prior to Admission medications   Medication Sig Start Date End Date Taking? Authorizing Provider  acetaminophen (TYLENOL) 325 MG tablet Take 2 tablets (650 mg total) by mouth every 6 (six) hours as needed for mild pain or moderate pain. 03/30/22  Yes Littie Deeds, MD  albuterol (PROVENTIL) (2.5 MG/3ML) 0.083% nebulizer solution Take 3 mLs (2.5 mg total) by nebulization every 6 (six) hours as needed for wheezing or shortness of breath. 04/04/23  Yes Vonna Drafts, MD  amLODipine (NORVASC) 10 MG tablet Take 1 tablet (10 mg total) by mouth at bedtime. 05/22/23  Yes Hindel, Leah, MD  divalproex (DEPAKOTE) 500 MG DR tablet Take 1 tablet (500 mg total) by mouth 2 (two) times daily. 04/04/23  Yes Vonna Drafts, MD  ferrous sulfate 324 (65 Fe) MG TBEC Take 1 tablet (325 mg total) by mouth daily at 12 noon. 04/04/23  Yes Vonna Drafts, MD  fluconazole (DIFLUCAN) 200 MG tablet Take 0.5 tablets (100 mg total) by mouth daily. 04/24/23  Yes Alwyn Ren, MD  folic acid (FOLVITE) 1 MG tablet TAKE 1 TABLET (1 MG TOTAL) BY MOUTH DAILY (AM) Patient  taking differently: Take 1 mg by mouth daily. 04/04/23  Yes Vonna Drafts, MD  lisinopril (ZESTRIL) 5 MG tablet Take 1 tablet (5 mg total) by mouth at bedtime. 05/22/23  Yes Hindel, Leah, MD  Multiple Vitamin (MULTIVITAMIN WITH MINERALS) TABS tablet Take 1 tablet by mouth daily. 04/04/23  Yes Vonna Drafts, MD  naltrexone (DEPADE) 50 MG tablet TAKE 1/2 TABLET (25 MG TOTAL) BY MOUTH DAILY (AM) Patient taking differently: Take 25 mg by mouth daily. 04/04/23  Yes Vonna Drafts, MD  OLANZapine (ZYPREXA) 5 MG tablet Take 1 tablet (5 mg total) by mouth at bedtime. Future refills will need to come from psychiatry. 04/04/23  Yes Vonna Drafts, MD  pantoprazole (PROTONIX) 40 MG tablet TAKE 1 TABLET (40 MG TOTAL) BY MOUTH DAILY (AM) Patient taking differently: Take 40 mg by mouth daily. 04/04/23  Yes Mahmood, Unknown Foley, MD  polyethylene glycol (MIRALAX / GLYCOLAX) 17 g packet MIX AND DRINK BY MOUTH DAILY AS NEEDED FOR MODERATE CONSTIPATION 04/04/23  Yes Vonna Drafts, MD  VENTOLIN HFA 108 (90 Base) MCG/ACT inhaler Inhale 1-2 puffs into the lungs every 6 (six) hours as needed for wheezing or shortness of breath. 04/04/23  Yes Vonna Drafts, MD  feeding supplement (ENSURE ENLIVE / ENSURE PLUS) LIQD Take 237 mLs by mouth 2 (two) times daily between meals. Patient not taking: Reported on 05/25/2023 04/24/23   Alwyn Ren, MD  fluticasone [facility]) 50 MCG/ACT  nasal spray Place 2 sprays into both nostrils daily. Patient not taking: Reported on 05/25/2023 04/04/23   Vonna Drafts, MD  hydrocortisone (ANUSOL-HC) 2.5 % rectal cream Place 1 Application rectally 2 (two) times daily. Patient not taking: Reported on 05/25/2023 04/04/23   Vonna Drafts, MD  mometasone-formoterol [facility]) 100-5 MCG/ACT AERO INHALE TWO PUFFS INTO THE LUNGS TWICE A DAY Patient not taking: Reported on 05/25/2023 04/04/23   Vonna Drafts, MD  Pedialyte (PEDIALYTE) SOLN Take 240 mLs by mouth every 6 (six) hours. Patient not taking: Reported on 05/25/2023  04/23/23   Alwyn Ren, MD      Allergies    Ibuprofen    Review of Systems   Review of Systems  Reason unable to perform ROS: Unresponsive.    Physical Exam Updated Vital Signs BP 131/82   Pulse 98   Temp (!) 92.5 F (33.6 C)   Resp (!) 30   SpO2 100%  Physical Exam Vitals and nursing note reviewed.   Gen: Sonorous, will localize to painful stimuli Eyes: Pupils 5 mm and sluggish HEENT: no oropharyngeal swelling Neck: trachea midline Resp: Coarse breath sounds at bilateral lung bases Card: RRR, no murmurs, rubs, or gallops Abd: nontender, nondistended Extremities: no calf tenderness, no edema Vascular: Palpable femoral pulses bilaterally Neuro: Will open eyes and localize to painful stimuli, minimally responsive Skin: Skin of the extremities cool to the touch    ED Results / Procedures / Treatments   Labs (all labs ordered are listed, but only abnormal results are displayed) Labs Reviewed  COMPREHENSIVE METABOLIC PANEL - Abnormal; Notable for the following components:      Result Value   CO2 8 (*)    Glucose, Bld 144 (*)    Creatinine, Ser 2.38 (*)    Calcium 7.2 (*)    Total Protein 4.1 (*)    Albumin 2.1 (*)    AST 3,849 (*)    ALT 1,659 (*)    Total Bilirubin 1.6 (*)    GFR, Estimated 36 (*)    Anion gap 31 (*)    All other components within normal limits  CBC WITH DIFFERENTIAL/PLATELET - Abnormal; Notable for the following components:   RBC 3.41 (*)    Hemoglobin 11.3 (*)    HCT 37.3 (*)    MCV 109.4 (*)    RDW 16.2 (*)    Platelets 122 (*)    nRBC 0.6 (*)    Abs Immature Granulocytes 0.61 (*)    All other components within normal limits  VALPROIC ACID LEVEL - Abnormal; Notable for the following components:   Valproic Acid Lvl 25 (*)    All other components within normal limits  I-STAT CHEM 8, ED - Abnormal; Notable for the following components:   Creatinine, Ser 2.00 (*)    Glucose, Bld 130 (*)    Calcium, Ion 0.90 (*)    TCO2 12 (*)     Hemoglobin 12.2 (*)    HCT 36.0 (*)    All other components within normal limits  I-STAT CG4 LACTIC ACID, ED - Abnormal; Notable for the following components:   Lactic Acid, Venous 13.8 (*)    All other components within normal limits  I-STAT CG4 LACTIC ACID, ED - Abnormal; Notable for the following components:   Lactic Acid, Venous 14.3 (*)    All other components within normal limits  I-STAT ARTERIAL BLOOD GAS, ED - Abnormal; Notable for the following components:   pH, Arterial 6.885 (*)    pCO2  arterial 56.2 (*)    pO2, Arterial 379 (*)    Bicarbonate 11.1 (*)    TCO2 13 (*)    Acid-base deficit 23.0 (*)    Calcium, Ion 0.98 (*)    HCT 37.0 (*)    Hemoglobin 12.6 (*)    All other components within normal limits  TROPONIN I (HIGH SENSITIVITY) - Abnormal; Notable for the following components:   Troponin I (High Sensitivity) 35 (*)    All other components within normal limits  CULTURE, BLOOD (ROUTINE X 2)  CULTURE, BLOOD (ROUTINE X 2)  CULTURE, RESPIRATORY W GRAM STAIN  ACETAMINOPHEN LEVEL  ETHANOL  SALICYLATE LEVEL  RAPID URINE DRUG SCREEN, HOSP PERFORMED  PROTIME-INR  FOLATE RBC  CK  URINALYSIS, ROUTINE W REFLEX MICROSCOPIC  BLOOD GAS, ARTERIAL  LACTIC ACID, PLASMA  LACTIC ACID, PLASMA  MAGNESIUM  PROCALCITONIN  BLOOD GAS, ARTERIAL  CBG MONITORING, ED  I-STAT CG4 LACTIC ACID, ED  TYPE AND SCREEN  TROPONIN I (HIGH SENSITIVITY)    EKG None  Radiology DG Chest Portable 1 View  Result Date: 05/25/2023 CLINICAL DATA:  Intubation EXAM: PORTABLE CHEST 1 VIEW COMPARISON:  Chest x-ray 03/01/2022 FINDINGS: Endotracheal tube tip is 11 mm above the carina. Enteric tube is seen extending into the stomach. The cardiomediastinal silhouette is within normal limits. There are minimal patchy airspace opacities in the right mid lung. Costophrenic angles are clear. No pneumothorax or acute fracture. IMPRESSION: 1. Endotracheal tube tip is 11 mm above the carina. 2. Minimal  patchy airspace opacities in the right mid lung. Electronically Signed   By: Darliss Cheney M.D.   On: 05/25/2023 15:24    Procedures .Central Line  Date/Time: 05/25/2023 3:31 PM  Performed by: Durwin Glaze, MD Authorized by: Durwin Glaze, MD   Consent:    Consent obtained:  Emergent situation Universal protocol:    Procedure explained and questions answered to patient or proxy's satisfaction: yes     Relevant documents present and verified: yes     Test results available: yes     Imaging studies available: yes     Required blood products, implants, devices, and special equipment available: yes     Site/side marked: yes     Immediately prior to procedure, a time out was called: yes     Patient identity confirmed:  Arm band, hospital-assigned identification number and anonymous protocol, patient vented/unresponsive Pre-procedure details:    Indication(s): central venous access and insufficient peripheral access     Hand hygiene: Hand hygiene performed prior to insertion     Sterile barrier technique: All elements of maximal sterile technique followed     Skin preparation:  Chlorhexidine   Skin preparation agent: Skin preparation agent completely dried prior to procedure   Sedation:    Sedation type:  Deep Procedure details:    Location:  R internal jugular   Patient position:  Supine   Procedural supplies:  Triple lumen   Catheter size:  7 Fr   Landmarks identified: yes     Ultrasound guidance: yes     Ultrasound guidance timing: real time     Sterile ultrasound techniques: Sterile gel and sterile probe covers were used     Number of attempts:  1   Successful placement: yes   Post-procedure details:    Post-procedure:  Dressing applied and line sutured   Assessment:  Blood return through all ports, no pneumothorax on x-ray, free fluid flow and placement verified by x-ray   Procedure  completion:  Tolerated Procedure Name: Intubation Date/Time: 05/25/2023 3:32 PM  Performed  by: Durwin Glaze, MDPre-anesthesia Checklist: Patient identified, Emergency Drugs available, Suction available, Patient being monitored and Timeout performed Oxygen Delivery Method: Ambu bag Preoxygenation: Pre-oxygenation with 100% oxygen Induction Type: Rapid sequence Ventilation: Mask ventilation without difficulty Laryngoscope Size: Mac and 4 Grade View: Grade III Tube size: 8.0 mm Number of attempts: 1 Airway Equipment and Method: Video-laryngoscopy Placement Confirmation: ETT inserted through vocal cords under direct vision, Positive ETCO2, CO2 detector and Breath sounds checked- equal and bilateral Secured at: 25 cm Tube secured with: ETT holder        Medications Ordered in ED Medications  norepinephrine (LEVOPHED) 4mg  in (0.016 mg/mL) premix infusion (20 mcg/min Intravenous Rate/Dose Change 05/25/23 1438)  rocuronium bromide 100 MG/10ML SOSY (  Not Given 05/25/23 1308)  etomidate (AMIDATE) 2 MG/ML injection (  Not Given 05/25/23 1308)  fentaNYL (SUBLIMAZE) injection 50 mcg (has no administration in time range)  fentaNYL in NS (36mcg/ml) infusion-PREMIX (0 mcg/hr Intravenous Stopped 05/25/23 1518)  fentaNYL (SUBLIMAZE) bolus via infusion 50-100 mcg (has no administration in time range)  vasopressin (PITRESSIN) 20 Units in 100 mL (0.2 unit/mL) infusion-*FOR SHOCK* (0.03 Units/min Intravenous New Bag/Given 05/25/23 1345)  EPINEPHrine (ADRENALIN) 5 mg in NS 250 mL (0.02 mg/mL) premix infusion (10 mcg/min Intravenous Rate/Dose Change 05/25/23 1524)  vancomycin (VANCOCIN) IVPB 1000 mg/200 mL premix (1,000 mg Intravenous New Bag/Given 05/25/23 1523)  thiamine (VITAMIN B1) injection 100 mg (has no administration in time range)  sodium bicarbonate injection 50 mEq (has no administration in time range)  sodium bicarbonate injection 50 mEq (has no administration in time range)  docusate (COLACE) 50 MG/5ML liquid 100 mg (has no administration in time range)  polyethylene  glycol (MIRALAX / GLYCOLAX) packet 17 g (has no administration in time range)  famotidine (PEPCID) tablet 20 mg (has no administration in time range)  lactated ringers infusion (has no administration in time range)  docusate (COLACE) 50 MG/5ML liquid 100 mg (has no administration in time range)  polyethylene glycol (MIRALAX / GLYCOLAX) packet 17 g (has no administration in time range)  ondansetron (ZOFRAN) injection 4 mg (has no administration in time range)  acetaminophen (TYLENOL) tablet 650 mg (has no administration in time range)    Or  acetaminophen (TYLENOL) 160 MG/5ML solution 650 mg (has no administration in time range)    Or  acetaminophen (TYLENOL) suppository 650 mg (has no administration in time range)  acetaminophen (TYLENOL) tablet 650 mg (has no administration in time range)    Or  acetaminophen (TYLENOL) 160 MG/5ML solution 650 mg (has no administration in time range)    Or  acetaminophen (TYLENOL) suppository 650 mg (has no administration in time range)  busPIRone (BUSPAR) tablet 30 mg (has no administration in time range)    Or  busPIRone (BUSPAR) tablet 30 mg (has no administration in time range)  magnesium sulfate IVPB 2 g 50 mL (has no administration in time range)  0.9 %  sodium chloride infusion (has no administration in time range)  fentaNYL (SUBLIMAZE) injection 50 mcg (has no administration in time range)  fentaNYL in NS (36mcg/ml) infusion-PREMIX (has no administration in time range)  fentaNYL (SUBLIMAZE) bolus via infusion 50-100 mcg (has no administration in time range)  enoxaparin (LOVENOX) injection 40 mg (has no administration in time range)  budesonide (PULMICORT) nebulizer solution 0.5 mg (has no administration in time range)  arformoterol (BROVANA) nebulizer solution 15 mcg (has  no administration in time range)  ipratropium-albuterol (DUONEB) 0.5-2.5 (3) MG/3ML nebulizer solution 3 mL (has no administration in time range)  sodium chloride  0.9 % bolus 1,000 mL (0 mLs Intravenous Stopped 05/25/23 1343)  EPINEPHrine (ADRENALIN) 1 MG/10ML injection (1 mg Intravenous Given 05/25/23 1258)  sodium bicarbonate injection (50 mEq Intravenous Given 05/25/23 1258)  etomidate (AMIDATE) injection (15 mg Intravenous Given 05/25/23 1300)  rocuronium (ZEMURON) injection (70 mg Intravenous Given 05/25/23 1300)  sodium chloride 0.9 % bolus 1,000 mL (1,000 mLs Intravenous New Bag/Given 05/25/23 1343)  ceFEPIme (MAXIPIME) 2 g in sodium chloride 0.9 % 100 mL IVPB (0 g Intravenous Stopped 05/25/23 1501)  sodium chloride 0.9 % bolus 1,000 mL (1,000 mLs Intravenous New Bag/Given 05/25/23 1436)  sodium bicarbonate injection 50 mEq (50 mEq Intravenous Given 05/25/23 1440)  iohexol (OMNIPAQUE) 350 MG/ML injection 75 mL (75 mLs Intravenous Contrast Given 05/25/23 1504)    ED Course/ Medical Decision Making/ A&P                                 Medical Decision Making 32 year old male with past medical history of seizure and psychiatric disorder presenting to the emergency department today after a seizure at home and with medics.  On arrival the patient was starting to move spontaneously and was starting to follow commands.  His initial blood pressure on her monitor was 109 systolic.  It was unclear if this was accurate.  Patient initial glucose here was within normal limits.  IV fluids were given through the IO and initially the patient seem to be protecting his airway.  We are having difficulty obtaining a pulse ox but when we were able to obtain 1 it was in the mid 90s.  As I was getting set up for a central line the patient became bradycardic and lost pulses subsequently.  CPR/ACLS protocol was initiated.  There was return of spontaneous circulation.  The patient was subsequently intubated.  The patient's blood pressures remained low after intubation despite being on vasopressors.  He was requiring escalating doses.  For access he has 2 IO's in place for access.    A  central line was placed for access and for blood draws.  I did discuss the patient's case with the ICU team immediately after his first return of spontaneous circulation.  He is started on vasopressin in addition to epinephrine due to persistently low blood pressures.  The patient is covered with empiric vancomycin and cefepime and blood cultures are ordered.  The patient's initial chest x-ray interpreted by me shows ET tube in correct position with no pulmonary edema, no pulmonary infiltrates, no pneumothorax.  The patient's second interpreted by me at bedside after central line placement show is right IJ in correct position with no pneumothorax.  I did discuss the patient's case with Dr. Amada Jupiter here in the emergency department due to his seizure history.  He recommends obtaining a CT angiogram to evaluate for basilar artery occlusion and to order an overnight EEG and he will have the patient hooked up after CT scan.  I spoke with the patient's family when they arrived.  They did report to me that the patient did wake up this morning and was complaining of not feeling well overall.  He is an alcoholic.  Folic acid and thiamine are ordered as well.  His venous blood gas did come back and the patient does have a metabolic acidosis and  his pCO2 was high.  The rate on the ventilator was changed from 18-26.  He is given 1 amp of bicarb for a pH of less than 6.9.  I did speak with the intensivist regarding this and they recommended 2 A of bicarb and they will put in for a bicarbonate infusion.  Critical care time 90 minutes including multiple reassessments, coordination of care with ICU team, leading team and resuscitative efforts when patient lost pulses, time taken discussing workup and prognosis with family, excludes billable procedures.  Amount and/or Complexity of Data Reviewed Labs: ordered. Radiology: ordered.  Risk Prescription drug management. Decision regarding  hospitalization.          Final Clinical Impression(s) / ED Diagnoses Final diagnoses:  Cardiac arrest (HCC)  Seizure (HCC)  Lactic acidosis    Rx / DC Orders ED Discharge Orders     None         Durwin Glaze, MD 05/25/23 [phone]

## 2023-05-25 NOTE — ED Notes (Signed)
Blood noted in OG tubing; Dr. Rhae Hammock notified

## 2023-05-25 NOTE — Consult Note (Signed)
Neurology Consultation Reason for Consult: Seizures Referring Physician: Rhae Hammock, a  CC: Unresponsive  History is obtained from: Chart review  HPI: Timothy Hubbard is a 32 y.o. male with a history of seizures who was treated with Depakote who reportedly had a seizure this morning.  He apparently woke up feeling ill, and subsequently was seen to have a seizure.  He was brought into the emergency department where he subsequently had arrest x 2.  Due to this, he was intubated given rocuronium about 35 minutes prior to my exam.    Past Medical History:  Diagnosis Date   Acute encephalopathy 03/10/2022   Adenomatous polyp of descending colon    Asthma    Encephalopathy acute 03/01/2022   Hematochezia    HTN (hypertension)    Hypokalemia 03/04/2022   Need for hepatitis C screening test 04/27/2022   Seizures (HCC)      Family History  Problem Relation Age of Onset   Hypertension Mother    Cancer Maternal Grandfather        doesn't know the type     Social History:  reports that he has been smoking cigarettes. He started smoking about 15 years ago. He has a 7.9 pack-year smoking history. He has never used smokeless tobacco. He reports current alcohol use of about 42.0 standard drinks of alcohol per week. He reports current drug use. Frequency: 7.00 times per week. Drug: Marijuana.   Exam: Current vital signs: BP (!) 69/56   Pulse 60   Temp (!) 94.1 F (34.5 C)   Resp 18   SpO2 100%  Vital signs in last 24 hours: Temp:  [94.1 F (34.5 C)-96.9 F (36.1 C)] 94.1 F (34.5 C) (09/19 1416) Pulse Rate:  [60-109] 60 (09/19 1329) Resp:  [14-26] 18 (09/19 1416) BP: (35-114)/(22-81) 69/56 (09/19 1412) SpO2:  [100 %] 100 % (09/19 1312) FiO2 (%):  [100 %] 100 % (09/19 1307)   Physical Exam  Intubated  Neuro: Mental Status: Patient is comatose, he does not open eyes or follow commands Cranial Nerves: II: He does not blink to threat, pupils are 4 to 5 mm and very sluggish  bilaterally III,IV, VI: No movement to doll's maneuver V:VII: Absent corneals Motor: No movement to noxious stimulation  sensory: As above  Deep Tendon Reflexes: Absent  Cerebellar: Does not perform     I have reviewed labs in epic and the results pertinent to this consultation are: Results for orders placed or performed during the hospital encounter of 05/25/23 (from the past 24 hour(s))  CBG monitoring, ED     Status: None   Collection Time: 05/25/23 12:47 PM  Result Value Ref Range   Glucose-Capillary 87 70 - 99 mg/dL  Acetaminophen level     Status: None   Collection Time: 05/25/23  2:04 PM  Result Value Ref Range   Acetaminophen (Tylenol), Serum 16 10 - 30 ug/mL  Comprehensive metabolic panel     Status: Abnormal   Collection Time: 05/25/23  2:04 PM  Result Value Ref Range   Sodium 140 135 - 145 mmol/L   Potassium 4.5 3.5 - 5.1 mmol/L   Chloride 101 98 - 111 mmol/L   CO2 8 (L) 22 - 32 mmol/L   Glucose, Bld 144 (H) 70 - 99 mg/dL   BUN 11 6 - 20 mg/dL   Creatinine, Ser 4.13 (H) 0.61 - 1.24 mg/dL   Calcium 7.2 (L) 8.9 - 10.3 mg/dL   Total Protein 4.1 (L) 6.5 - 8.1  g/dL   Albumin 2.1 (L) 3.5 - 5.0 g/dL   AST 6,433 (H) 15 - 41 U/L   ALT 1,659 (H) 0 - 44 U/L   Alkaline Phosphatase 46 38 - 126 U/L   Total Bilirubin 1.6 (H) 0.3 - 1.2 mg/dL   GFR, Estimated 36 (L) >60 mL/min   Anion gap 31 (H) 5 - 15  Ethanol     Status: None   Collection Time: 05/25/23  2:04 PM  Result Value Ref Range   Alcohol, Ethyl (B) <10 <10 mg/dL  Salicylate level     Status: None   Collection Time: 05/25/23  2:04 PM  Result Value Ref Range   Salicylate Lvl 8.1 7.0 - 30.0 mg/dL  Troponin I (High Sensitivity)     Status: Abnormal   Collection Time: 05/25/23  2:04 PM  Result Value Ref Range   Troponin I (High Sensitivity) 35 (H) <18 ng/L  CBC with Differential     Status: Abnormal   Collection Time: 05/25/23  2:04 PM  Result Value Ref Range   WBC 8.0 4.0 - 10.5 K/uL   RBC 3.41 (L) 4.22 -  5.81 MIL/uL   Hemoglobin 11.3 (L) 13.0 - 17.0 g/dL   HCT 29.5 (L) 18.8 - 41.6 %   MCV 109.4 (H) 80.0 - 100.0 fL   MCH 33.1 26.0 - 34.0 pg   MCHC 30.3 30.0 - 36.0 g/dL   RDW 60.6 (H) 30.1 - 60.1 %   Platelets 122 (L) 150 - 400 K/uL   nRBC 0.6 (H) 0.0 - 0.2 %   Neutrophils Relative % 60 %   Neutro Abs 4.8 1.7 - 7.7 K/uL   Lymphocytes Relative 27 %   Lymphs Abs 2.2 0.7 - 4.0 K/uL   Monocytes Relative 4 %   Monocytes Absolute 0.4 0.1 - 1.0 K/uL   Eosinophils Relative 0 %   Eosinophils Absolute 0.0 0.0 - 0.5 K/uL   Basophils Relative 1 %   Basophils Absolute 0.0 0.0 - 0.1 K/uL   WBC Morphology Mild Left Shift (1-5% metas, occ myelo)    RBC Morphology MORPHOLOGY UNREMARKABLE    Smear Review MORPHOLOGY UNREMARKABLE    Immature Granulocytes 8 %   Abs Immature Granulocytes 0.61 (H) 0.00 - 0.07 K/uL  Type and screen [facility]     Status: None (Preliminary result)   Collection Time: 05/25/23  2:04 PM  Result Value Ref Range   ABO/RH(D) PENDING    Antibody Screen PENDING    Sample Expiration      05/28/2023,2359 Performed at [facility], [address] [place], [place] [address]   Valproic acid level     Status: Abnormal   Collection Time: 05/25/23  2:11 PM  Result Value Ref Range   Valproic Acid Lvl 25 (L) 50.0 - 100.0 ug/mL  I-stat chem 8, ED     Status: Abnormal   Collection Time: 05/25/23  2:14 PM  Result Value Ref Range   Sodium 136 135 - 145 mmol/L   Potassium 4.4 3.5 - 5.1 mmol/L   Chloride 104 98 - 111 mmol/L   BUN 12 6 - 20 mg/dL   Creatinine, Ser 5.57 (H) 0.61 - 1.24 mg/dL   Glucose, Bld 322 (H) 70 - 99 mg/dL   Calcium, Ion 0.25 (L) 1.15 - 1.40 mmol/L   TCO2 12 (L) 22 - 32 mmol/L   Hemoglobin 12.2 (L) 13.0 - 17.0 g/dL   HCT 42.7 (L) 06.2 - 37.6 %  I-Stat CG4 Lactic Acid     Status: Abnormal   Collection Time: 05/25/23  2:14 PM  Result Value Ref Range   Lactic Acid, Venous 13.8 (HH) 0.5 - 1.9 mmol/L  I-Stat CG4 Lactic Acid, ED     Status:  Abnormal   Collection Time: 05/25/23  2:20 PM  Result Value Ref Range   Lactic Acid, Venous 14.3 (HH) 0.5 - 1.9 mmol/L   Comment NOTIFIED PHYSICIAN   I-Stat arterial blood gas, ED     Status: Abnormal   Collection Time: 05/25/23  2:29 PM  Result Value Ref Range   pH, Arterial 6.885 (LL) 7.35 - 7.45   pCO2 arterial 56.2 (H) 32 - 48 mmHg   pO2, Arterial 379 (H) 83 - 108 mmHg   Bicarbonate 11.1 (L) 20.0 - 28.0 mmol/L   TCO2 13 (L) 22 - 32 mmol/L   O2 Saturation 100 %   Acid-base deficit 23.0 (H) 0.0 - 2.0 mmol/L   Sodium 135 135 - 145 mmol/L   Potassium 3.7 3.5 - 5.1 mmol/L   Calcium, Ion 0.98 (L) 1.15 - 1.40 mmol/L   HCT 37.0 (L) 39.0 - 52.0 %   Hemoglobin 12.6 (L) 13.0 - 17.0 g/dL   Patient temperature 91.4 F    Collection site RADIAL, ALLEN'S TEST ACCEPTABLE    Drawn by RT    Sample type ARTERIAL    Comment NOTIFIED PHYSICIAN      I have reviewed the images obtained: CT/CTA-negative  Impression: 32 year old male with cardiac arrest after seizures.  He was apparently feeling ill even prior to the seizures, and I wonder if there was something else going on that was lowering his seizure threshold.  He is severely acidotic, and I wonder if acidosis from the seizure could have played a role in his cardiac arrest.  His Depakote level is low, and I will load with valproic acid and continue this IV for the time being.  I will continue continuous EEG monitoring to assess for underlying seizure activity.  Recommendations: 1) valproate 500 mg 3 times daily 2) continuous EEG 3) neurology will continue to follow  This patient is critically ill and at significant risk of neurological worsening, death and care requires constant monitoring of vital signs, hemodynamics,respiratory and cardiac monitoring, neurological assessment, discussion with family, other specialists and medical decision making of high complexity. I spent 45 minutes of neurocritical care time  in the care of  this patient.  This was time spent independent of any time provided by nurse practitioner or PA.  Ritta Slot, MD [facility] [phone]  If 7pm- 7am, please page neurology on call as listed in AMION. 05/25/2023  4:45 PM

## 2023-05-25 NOTE — Progress Notes (Signed)
Patient transported from ED to 2M07 without complications. RN at bedside.

## 2023-05-25 NOTE — ED Notes (Signed)
Patient transported to CT 

## 2023-05-25 NOTE — Progress Notes (Signed)
Patient arrived to unit at 1732. Vitals as listed below. Patient arrived unresponsive - not following commands or moving to pain, bleeding from central line, blood in mouth & blood under patient - presumably from oozing central line. Patient on Levo, Vaso, & Epi on arrival. Cool to touch. EEG at bedside momentarily. Critical values called in to Chand, MD - PT > 90, INR > 10 - no new orders placed.     05/25/23 1732  Vitals  Temp (!) 95.5 F (35.3 C)  Temp Source Bladder  BP (!) 69/52  MAP (mmHg) (!) 59  BP Location Right Arm  BP Method Automatic  Patient Position (if appropriate) Lying  Pulse Rate (!) 117  Pulse Rate Source Monitor  ECG Heart Rate (!) 117  Resp (!) 30  Level of Consciousness  Level of Consciousness Unresponsive  Oxygen Therapy  SpO2 99 %  O2 Device Ventilator  Patient Activity (if Appropriate) In bed  Pulse Oximetry Type Continuous  Pre-WUA / WUA Start  [place] Agitation Sedation Scale (RASS) -5  RASS Goal 0  ECG Monitoring  Cardiac Rhythm ST  Telemetry Box Number [id number]  Tele Box Verification Completed by Second Verifier Completed  Pain Assessment  Pain Scale CPOT  Critical Care Pain Observation Tool (CPOT)  Facial Expression 0  Body Movements 0  Muscle Tension 0  Compliance with ventilator (intubated pts.) 0  Vocalization (extubated pts.) N/A  CPOT Total 0  PCA/Epidural/Spinal Assessment  Respiratory Pattern Regular;Unlabored;Symmetrical  Glasgow Coma Scale  Eye Opening 1  Modified Verbal Response (INTUBATED) 1  Best Motor Response 1  Glasgow Coma Scale Score 3  MEWS Score  MEWS Temp 1  MEWS Systolic 3  MEWS Pulse 2  MEWS RR 2  MEWS LOC 3  MEWS Score 11  MEWS Score Color Red

## 2023-05-25 NOTE — Code Documentation (Signed)
Palpable pulse present

## 2023-05-25 NOTE — ED Notes (Signed)
Fentanyl infusion stopped per critical care

## 2023-05-25 NOTE — ED Notes (Signed)
Vasopressors switched to central line; confirmed placement by Dr. Rhae Hammock

## 2023-05-25 NOTE — ED Notes (Signed)
Iv team at bedside

## 2023-05-25 NOTE — ED Notes (Signed)
Bair hugger applied.

## 2023-05-25 NOTE — Progress Notes (Signed)
Pharmacy Antibiotic Note  Timothy Hubbard is a 32 y.o. male for which pharmacy has been consulted for zosyn dosing for sepsis.  Patient with a history of encephalopathy, asthma, hematochezia, HTN, seizures. Patient presenting with seizure and cardiac arrest.  SCr 2 - AKI WBC 8; LA 14.3; T 92.5>93.3; HR 109; RR 30 On vasopressors  Plan: Cefepime/vancomycin given in ED x 1 Start Zosyn 3.375g IV q8h (4 hour infusion) Monitor WBC, fever, renal function, cultures De-escalate when able     Temp (24hrs), Avg:93.7 F (34.3 C), Min:92.5 F (33.6 C), Max:96.9 F (36.1 C)  Recent Labs  Lab 05/22/23 1213 05/25/23 1404 05/25/23 1414 05/25/23 1420  WBC  --  8.0  --   --   CREATININE 0.64* 2.38* 2.00*  --   LATICACIDVEN  --   --  13.8* 14.3*    Estimated Creatinine Clearance: 41.8 mL/min (A) (by C-G formula based on SCr of 2 mg/dL (H)).    Allergies  Allergen Reactions   Ibuprofen Anaphylaxis    Microbiology results: Pending  Thank you for allowing pharmacy to be a part of this patient's care.  Delmar Landau, PharmD, BCPS 05/25/2023 3:43 PM ED Clinical Pharmacist -  [phone]

## 2023-05-25 NOTE — Progress Notes (Signed)
eLink Physician-Brief Progress Note Patient Name: Timothy Hubbard DOB: 1991/02/26 MRN: [id number]   Date of Service  05/25/2023  HPI/Events of Note  32 year old male in ICU with cardiac arrest.  He is in shock currently on epinephrine, norepinephrine and vasopressin.  Severely acidotic.  eICU Interventions  Reviewed I's and O's.  Needs more fluid resuscitation.  IV fluids ordered.  Sodium bicarbonate 100 mEq post and patient has been started on a drip. Ground team to have discussion with family on patient's condition.     Intervention Category Major Interventions: Acid-Base disturbance - evaluation and management;Shock - evaluation and management  Carilyn Goodpasture 05/25/2023, 8:22 PM

## 2023-05-25 NOTE — ED Notes (Signed)
Critical care at bedside

## 2023-05-25 NOTE — Progress Notes (Signed)
Notified ED Provider of critical ABG

## 2023-05-25 NOTE — ED Notes (Signed)
Neurology at bedside.

## 2023-05-25 NOTE — H&P (Signed)
NAME:  Timothy Hubbard, MRN:  [id number], DOB:  [birth year], LOS: 0 ADMISSION DATE:  05/25/2023, CONSULTATION DATE:  9/19 REFERRING MD:  Dr. Rhae Hammock, CHIEF COMPLAINT:  seizure/cardiac arrest   History of Present Illness:  Patient is a [name] w/ pertinent PMH seizures, asthma, htn, mood disorder presents to [facility] ED on 9/19 w/ seizure and cardiac arrest.  Per family patient compliant w/ seizure medications.  Had a witnessed seizure at home. EMS arrived and patient unresponsive. On route to ED patient became bradycardic 20-30 and pea arrested. 1 round cpr performed. Patient breathing spontaneously post arrest. Started on epi drip. On ED arrival patient altered but withdrew to painful stimuli. During intubation patient pea arrested and rosc after 1 round of cpr. EKG sinus tachy 130s. Patient remained hypotensive post intubation and cvl placed. Currently on 3 pressors. Temp 92 F and placed on bair hugger. LA 14. Given IV fluids and cultures obtained and placed on broad spectrum antibiotics. CXR w/ ett in good position; no infiltrate appreciated. Abg 6.88, 56, 379, 11. Given 2 amps of bicarb and ordered bicarb drip. CTA head, abd, pelvis ordered/pending. PCCM consulted for icu admission.  Pertinent  Medical History   Past Medical History:  Diagnosis Date   Acute encephalopathy 03/10/2022   Adenomatous polyp of descending colon    Asthma    Encephalopathy acute 03/01/2022   Hematochezia    HTN (hypertension)    Hypokalemia 03/04/2022   Need for hepatitis C screening test 04/27/2022   Seizures (HCC)      Significant Hospital Events: Including procedures, antibiotic start and stop dates in addition to other pertinent events   9/19 admit to [facility]; seizure and cardiac arrest  Interim History / Subjective:  Intubated on mech vent On 20 levo, 13 epi, 0.03 vaso   Objective   Blood pressure 129/88, pulse (!) 123, temperature (!) 92.9 F (33.8 C), resp. rate (!) 26, SpO2 100%.    Vent Mode: PRVC FiO2  (%):  [60 %-100 %] 60 % Set Rate:  [18 bmp-26 bmp] 26 bmp Vt Set:  [510 mL] 510 mL PEEP:  [5 cmH20] 5 cmH20 Plateau Pressure:  [16 cmH20] 16 cmH20  No intake or output data in the 24 hours ending 05/25/23 1518 There were no vitals filed for this visit.  Examination: General: critically ill appearing on mech vent HEENT: MM pink/moist; ETT in place Neuro: on 50 mcg fentanyl; unresponsive; perrl CV: s1s2, RRR, no m/r/g PULM:  dim clear BS bilaterally; on mech vent PRVC GI: soft, bsx4 active  Extremities: warm/dry, no edema  Skin: no rashes or lesions    Resolved Hospital Problem list     Assessment & Plan:  Cardiac arrest: brady arrest w/ 1 round cpr by ems and arrested again during intubation 1 more  round. Shock: septic vs. cardiogenic Plan: -will admit to ICU w/ continuous telemetry monitoring -continue pressors for MAP goal >65 -IV fluids -trend troponin and lactate -will start on bicarb drip; rr increased on vetn; check ABG in few hours -CMP and mag pending -echo -check cultures: bcx2, urine culture/UA -cont broad spectrum antibiotics -check procalcitonin -TTM normothermia protocol in place  Seizure Acute encephalopathy: likely post ictal and hypercapnia related -ethanol, salicylate, acetaminophen level wnl P: -neuro consulted; appreciate recs -CT head results pending -valproic acid level pending -send UDS and UA  -EEG -seizure precautions -AEDs per neuro  Acute respiratory failure w/ hypercapnia Hx of asthma Plan: -RR increased -repeat abg in few hours -LTVV  strategy with tidal volumes of 6-8 cc/kg ideal body weight -Wean PEEP/FiO2 for SpO2 >92% -VAP bundle in place -Daily SAT and SBT -PAD protocol in place -wean sedation for RASS goal 0 to -1 -brovana/pulmicort; prn duoneb  AKI AGMA Plan: -Trend BMP / urinary output -Replace electrolytes as indicated -Avoid nephrotoxic agents, ensure adequate renal perfusion  HTN Plan: -hold  antihypertensives w/ hypotension  Mood disorder Plan: -hold zyprexa   Best Practice (right click and "Reselect all SmartList Selections" daily)   Diet/type: NPO w/ meds via tube DVT prophylaxis: LMWH GI prophylaxis: H2B Lines: Central line Foley:  Yes, and it is still needed Code Status:  full code Last date of multidisciplinary goals of care discussion [pending]  Labs   CBC: Recent Labs  Lab 05/25/23 1404 05/25/23 1414 05/25/23 1429  WBC 8.0  --   --   NEUTROABS 4.8  --   --   HGB 11.3* 12.2* 12.6*  HCT 37.3* 36.0* 37.0*  MCV 109.4*  --   --   PLT 122*  --   --     Basic Metabolic Panel: Recent Labs  Lab 05/22/23 1213 05/25/23 1414 05/25/23 1429  NA 139 136 135  K 4.8 4.4 3.7  CL 99 104  --   CO2 17*  --   --   GLUCOSE 51* 130*  --   BUN 9 12  --   CREATININE 0.64* 2.00*  --   CALCIUM 8.9  --   --    GFR: Estimated Creatinine Clearance: 41.8 mL/min (A) (by C-G formula based on SCr of 2 mg/dL (H)). Recent Labs  Lab 05/25/23 1404 05/25/23 1414 05/25/23 1420  WBC 8.0  --   --   LATICACIDVEN  --  13.8* 14.3*    Liver Function Tests: No results for input(s): "AST", "ALT", "ALKPHOS", "BILITOT", "PROT", "ALBUMIN" in the last 168 hours. No results for input(s): "LIPASE", "AMYLASE" in the last 168 hours. No results for input(s): "AMMONIA" in the last 168 hours.  ABG    Component Value Date/Time   PHART 6.885 (LL) 05/25/2023 1429   PCO2ART 56.2 (H) 05/25/2023 1429   PO2ART 379 (H) 05/25/2023 1429   HCO3 11.1 (L) 05/25/2023 1429   TCO2 13 (L) 05/25/2023 1429   ACIDBASEDEF 23.0 (H) 05/25/2023 1429   O2SAT 100 05/25/2023 1429     Coagulation Profile: No results for input(s): "INR", "PROTIME" in the last 168 hours.  Cardiac Enzymes: No results for input(s): "CKTOTAL", "CKMB", "CKMBINDEX", "TROPONINI" in the last 168 hours.  HbA1C: Hgb A1c MFr Bld  Date/Time Value Ref Range Status  03/03/2022 08:42 AM <4.2 (L) 4.8 - 5.6 % Final    Comment:     (NOTE) **Verified by repeat analysis**         Prediabetes: 5.7 - 6.4         Diabetes: >6.4         Glycemic control for adults with diabetes: <7.0     CBG: Recent Labs  Lab 05/25/23 1247  GLUCAP 87    Review of Systems:   Patient is encephalopathic and/or intubated; therefore, history has been obtained from chart review.    Past Medical History:  He,  has a past medical history of Acute encephalopathy (03/10/2022), Adenomatous polyp of descending colon, Asthma, Encephalopathy acute (03/01/2022), Hematochezia, HTN (hypertension), Hypokalemia (03/04/2022), Need for hepatitis C screening test (04/27/2022), and Seizures (HCC).   Surgical History:   Past Surgical History:  Procedure Laterality Date   COLONOSCOPY WITH PROPOFOL  N/A 03/14/2022   Procedure: COLONOSCOPY WITH PROPOFOL;  Surgeon: Shellia Cleverly, DO;  Location: [facility] ENDOSCOPY;  Service: Gastroenterology;  Laterality: N/A;   POLYPECTOMY  03/14/2022   Procedure: POLYPECTOMY;  Surgeon: Shellia Cleverly, DO;  Location: [facility] ENDOSCOPY;  Service: Gastroenterology;;     Social History:   reports that he has been smoking cigarettes. He started smoking about 15 years ago. He has a 7.9 pack-year smoking history. He has never used smokeless tobacco. He reports current alcohol use of about 42.0 standard drinks of alcohol per week. He reports current drug use. Frequency: 7.00 times per week. Drug: Marijuana.   Family History:  His family history includes Cancer in his maternal grandfather; Hypertension in his mother.   Allergies Allergies  Allergen Reactions   Ibuprofen Anaphylaxis     Home Medications  Prior to Admission medications   Medication Sig Start Date End Date Taking? Authorizing Provider  acetaminophen (TYLENOL) 325 MG tablet Take 2 tablets (650 mg total) by mouth every 6 (six) hours as needed for mild pain or moderate pain. 03/30/22   Littie Deeds, MD  albuterol (PROVENTIL) (2.5 MG/3ML) 0.083% nebulizer solution  Take 3 mLs (2.5 mg total) by nebulization every 6 (six) hours as needed for wheezing or shortness of breath. 04/04/23   Vonna Drafts, MD  amLODipine (NORVASC) 10 MG tablet Take 1 tablet (10 mg total) by mouth at bedtime. 05/22/23   Hindel, Tacey Ruiz, MD  divalproex (DEPAKOTE) 500 MG DR tablet Take 1 tablet (500 mg total) by mouth 2 (two) times daily. 04/04/23   Vonna Drafts, MD  feeding supplement (ENSURE ENLIVE / ENSURE PLUS) LIQD Take 237 mLs by mouth 2 (two) times daily between meals. 04/24/23   Alwyn Ren, MD  ferrous sulfate 324 (65 Fe) MG TBEC Take 1 tablet (325 mg total) by mouth daily at 12 noon. 04/04/23   Vonna Drafts, MD  fluconazole (DIFLUCAN) 200 MG tablet Take 0.5 tablets (100 mg total) by mouth daily. 04/24/23   Alwyn Ren, MD  fluticasone [facility]) 50 MCG/ACT nasal spray Place 2 sprays into both nostrils daily. Patient taking differently: Place 2 sprays into both nostrils daily as needed for allergies. 04/04/23   Vonna Drafts, MD  folic acid (FOLVITE) 1 MG tablet TAKE 1 TABLET (1 MG TOTAL) BY MOUTH DAILY (AM) Patient taking differently: Take 1 mg by mouth daily. 04/04/23   Vonna Drafts, MD  hydrocortisone (ANUSOL-HC) 2.5 % rectal cream Place 1 Application rectally 2 (two) times daily. 04/04/23   Vonna Drafts, MD  lidocaine (XYLOCAINE) 2 % solution Use as directed 15 mLs in the mouth or throat every 4 (four) hours as needed for mouth pain. 04/23/23   Alwyn Ren, MD  lisinopril (ZESTRIL) 5 MG tablet Take 1 tablet (5 mg total) by mouth at bedtime. 05/22/23   Hindel, Tacey Ruiz, MD  mometasone-formoterol (DULERA) 100-5 MCG/ACT AERO INHALE TWO PUFFS INTO THE LUNGS TWICE A DAY Patient taking differently: Inhale 2 puffs into the lungs 2 (two) times daily. 04/04/23   Vonna Drafts, MD  Multiple Vitamin (MULTIVITAMIN WITH MINERALS) TABS tablet Take 1 tablet by mouth daily. 04/04/23   Vonna Drafts, MD  naltrexone (DEPADE) 50 MG tablet TAKE 1/2 TABLET (25 MG TOTAL) BY MOUTH DAILY  (AM) Patient taking differently: Take 25 mg by mouth daily. 04/04/23   Vonna Drafts, MD  nystatin (MYCOSTATIN) 100000 UNIT/ML suspension Take 5 mLs (500,000 Units total) by mouth 4 (four) times daily. 04/23/23   Alwyn Ren, MD  OLANZapine (ZYPREXA) 5 MG tablet Take 1 tablet (5 mg total) by mouth at bedtime. Future refills will need to come from psychiatry. 04/04/23   Vonna Drafts, MD  pantoprazole (PROTONIX) 40 MG tablet TAKE 1 TABLET (40 MG TOTAL) BY MOUTH DAILY (AM) Patient taking differently: Take 40 mg by mouth daily. 04/04/23   Vonna Drafts, MD  Pedialyte (PEDIALYTE) SOLN Take 240 mLs by mouth every 6 (six) hours. 04/23/23   Alwyn Ren, MD  polyethylene glycol (MIRALAX / GLYCOLAX) 17 g packet MIX AND DRINK BY MOUTH DAILY AS NEEDED FOR MODERATE CONSTIPATION Patient not taking: Reported on 04/22/2023 04/04/23   Vonna Drafts, MD  VENTOLIN HFA 108 (90 Base) MCG/ACT inhaler Inhale 1-2 puffs into the lungs every 6 (six) hours as needed for wheezing or shortness of breath. 04/04/23   Vonna Drafts, MD     Critical care time: 45 minutes       JD Anselm Lis [facility] 05/25/2023, 3:18 PM  Please see [email or web address] for pager details.  From 7A-7P if no response, please call [phone]. After hours, please call ELink [phone].

## 2023-05-25 NOTE — Progress Notes (Signed)
Patient transported on the ventilator to CT & back to ED Trauma room C with no problems.

## 2023-05-25 NOTE — Progress Notes (Signed)
RT was unable to obtain ABG x 2. Night RT will be made aware.

## 2023-05-26 ENCOUNTER — Inpatient Hospital Stay (HOSPITAL_COMMUNITY): Payer: Medicare Other

## 2023-05-26 DIAGNOSIS — E872 Acidosis, unspecified: Secondary | ICD-10-CM

## 2023-05-26 DIAGNOSIS — R569 Unspecified convulsions: Secondary | ICD-10-CM | POA: Diagnosis not present

## 2023-05-26 DIAGNOSIS — I469 Cardiac arrest, cause unspecified: Secondary | ICD-10-CM | POA: Diagnosis not present

## 2023-05-26 DIAGNOSIS — J9602 Acute respiratory failure with hypercapnia: Secondary | ICD-10-CM | POA: Diagnosis not present

## 2023-05-26 LAB — ECHOCARDIOGRAM COMPLETE
Area-P 1/2: 3.43 cm2
S' Lateral: 2.8 cm
Single Plane A4C EF: 62.2 %
Weight: 2067.03 oz

## 2023-05-26 LAB — CBC
HCT: 17.6 % — ABNORMAL LOW (ref 39.0–52.0)
Hemoglobin: 5.4 g/dL — CL (ref 13.0–17.0)
MCH: 32.1 pg (ref 26.0–34.0)
MCHC: 30.7 g/dL (ref 30.0–36.0)
MCV: 104.8 fL — ABNORMAL HIGH (ref 80.0–100.0)
Platelets: 64 10*3/uL — ABNORMAL LOW (ref 150–400)
RBC: 1.68 MIL/uL — ABNORMAL LOW (ref 4.22–5.81)
RDW: 15.9 % — ABNORMAL HIGH (ref 11.5–15.5)
WBC: 2.1 10*3/uL — ABNORMAL LOW (ref 4.0–10.5)
nRBC: 1 % — ABNORMAL HIGH (ref 0.0–0.2)

## 2023-05-26 LAB — URINALYSIS, ROUTINE W REFLEX MICROSCOPIC
Bilirubin Urine: NEGATIVE
Glucose, UA: 50 mg/dL — AB
Ketones, ur: NEGATIVE mg/dL
Nitrite: NEGATIVE
Protein, ur: 100 mg/dL — AB
Specific Gravity, Urine: 1.032 — ABNORMAL HIGH (ref 1.005–1.030)
WBC, UA: >50 WBC/hpf (ref 0–5)
pH: 5 (ref 5.0–8.0)

## 2023-05-26 LAB — PREPARE RBC (CROSSMATCH)

## 2023-05-26 LAB — GLUCOSE, CAPILLARY
Glucose-Capillary: 112 mg/dL — ABNORMAL HIGH (ref 70–99)
Glucose-Capillary: 126 mg/dL — ABNORMAL HIGH (ref 70–99)
Glucose-Capillary: 141 mg/dL — ABNORMAL HIGH (ref 70–99)
Glucose-Capillary: 148 mg/dL — ABNORMAL HIGH (ref 70–99)
Glucose-Capillary: 204 mg/dL — ABNORMAL HIGH (ref 70–99)
Glucose-Capillary: 208 mg/dL — ABNORMAL HIGH (ref 70–99)
Glucose-Capillary: 272 mg/dL — ABNORMAL HIGH (ref 70–99)
Glucose-Capillary: 367 mg/dL — ABNORMAL HIGH (ref 70–99)
Glucose-Capillary: 389 mg/dL — ABNORMAL HIGH (ref 70–99)
Glucose-Capillary: 401 mg/dL — ABNORMAL HIGH (ref 70–99)
Glucose-Capillary: 426 mg/dL — ABNORMAL HIGH (ref 70–99)
Glucose-Capillary: 432 mg/dL — ABNORMAL HIGH (ref 70–99)
Glucose-Capillary: 444 mg/dL — ABNORMAL HIGH (ref 70–99)
Glucose-Capillary: 445 mg/dL — ABNORMAL HIGH (ref 70–99)
Glucose-Capillary: 472 mg/dL — ABNORMAL HIGH (ref 70–99)
Glucose-Capillary: 483 mg/dL — ABNORMAL HIGH (ref 70–99)
Glucose-Capillary: 507 mg/dL (ref 70–99)
Glucose-Capillary: 72 mg/dL (ref 70–99)

## 2023-05-26 LAB — TECHNOLOGIST SMEAR REVIEW: Plt Morphology: DECREASED

## 2023-05-26 LAB — BASIC METABOLIC PANEL
Anion gap: 29 — ABNORMAL HIGH (ref 5–15)
BUN: 13 mg/dL (ref 6–20)
CO2: 12 mmol/L — ABNORMAL LOW (ref 22–32)
Calcium: 6.3 mg/dL — CL (ref 8.9–10.3)
Chloride: 93 mmol/L — ABNORMAL LOW (ref 98–111)
Creatinine, Ser: 2.8 mg/dL — ABNORMAL HIGH (ref 0.61–1.24)
GFR, Estimated: 30 mL/min — ABNORMAL LOW (ref >60)
Glucose, Bld: 431 mg/dL — ABNORMAL HIGH (ref 70–99)
Potassium: 2.7 mmol/L — CL (ref 3.5–5.1)
Sodium: 134 mmol/L — ABNORMAL LOW (ref 135–145)

## 2023-05-26 LAB — CBC WITH DIFFERENTIAL/PLATELET
Abs Immature Granulocytes: 0.1 10*3/uL — ABNORMAL HIGH (ref 0.00–0.07)
Basophils Absolute: 0 10*3/uL (ref 0.0–0.1)
Basophils Relative: 0 %
Eosinophils Absolute: 0 10*3/uL (ref 0.0–0.5)
Eosinophils Relative: 0 %
HCT: 18.8 % — ABNORMAL LOW (ref 39.0–52.0)
Hemoglobin: 6.4 g/dL — CL (ref 13.0–17.0)
Immature Granulocytes: 2 %
Lymphocytes Relative: 39 %
Lymphs Abs: 2.1 10*3/uL (ref 0.7–4.0)
MCH: 30.8 pg (ref 26.0–34.0)
MCHC: 34 g/dL (ref 30.0–36.0)
MCV: 90.4 fL (ref 80.0–100.0)
Monocytes Absolute: 0.1 10*3/uL (ref 0.1–1.0)
Monocytes Relative: 2 %
Neutro Abs: 3.1 10*3/uL (ref 1.7–7.7)
Neutrophils Relative %: 57 %
Platelets: 32 10*3/uL — ABNORMAL LOW (ref 150–400)
RBC: 2.08 MIL/uL — ABNORMAL LOW (ref 4.22–5.81)
RDW: 17.8 % — ABNORMAL HIGH (ref 11.5–15.5)
Smear Review: DECREASED
WBC: 5.5 10*3/uL (ref 4.0–10.5)
nRBC: 0.9 % — ABNORMAL HIGH (ref 0.0–0.2)

## 2023-05-26 LAB — POCT I-STAT 7, (LYTES, BLD GAS, ICA,H+H)
Acid-base deficit: 15 mmol/L — ABNORMAL HIGH (ref 0.0–2.0)
Bicarbonate: 11.8 mmol/L — ABNORMAL LOW (ref 20.0–28.0)
Calcium, Ion: 0.87 mmol/L — CL (ref 1.15–1.40)
HCT: 15 % — ABNORMAL LOW (ref 39.0–52.0)
Hemoglobin: 5.1 g/dL — CL (ref 13.0–17.0)
O2 Saturation: 59 %
Potassium: 2.7 mmol/L — CL (ref 3.5–5.1)
Sodium: 133 mmol/L — ABNORMAL LOW (ref 135–145)
TCO2: 13 mmol/L — ABNORMAL LOW (ref 22–32)
pCO2 arterial: 29.9 mmHg — ABNORMAL LOW (ref 32–48)
pH, Arterial: 7.203 — ABNORMAL LOW (ref 7.35–7.45)
pO2, Arterial: 37 mmHg — CL (ref 83–108)

## 2023-05-26 LAB — RAPID URINE DRUG SCREEN, HOSP PERFORMED
Amphetamines: NOT DETECTED
Barbiturates: NOT DETECTED
Benzodiazepines: POSITIVE — AB
Cocaine: NOT DETECTED
Opiates: NOT DETECTED
Tetrahydrocannabinol: POSITIVE — AB

## 2023-05-26 LAB — HEPATIC FUNCTION PANEL
ALT: 738 U/L — ABNORMAL HIGH (ref 0–44)
AST: 5642 U/L — ABNORMAL HIGH (ref 15–41)
Albumin: 1.9 g/dL — ABNORMAL LOW (ref 3.5–5.0)
Alkaline Phosphatase: 25 U/L — ABNORMAL LOW (ref 38–126)
Bilirubin, Direct: 0.5 mg/dL — ABNORMAL HIGH (ref 0.0–0.2)
Indirect Bilirubin: 0.7 mg/dL (ref 0.3–0.9)
Total Bilirubin: 1.2 mg/dL (ref 0.3–1.2)
Total Protein: 3.2 g/dL — ABNORMAL LOW (ref 6.5–8.1)

## 2023-05-26 LAB — CK: Total CK: 3654 U/L — ABNORMAL HIGH (ref 49–397)

## 2023-05-26 LAB — LACTIC ACID, PLASMA
Lactic Acid, Venous: >9 mmol/L (ref 0.5–1.9)
Lactic Acid, Venous: >9 mmol/L (ref 0.5–1.9)

## 2023-05-26 LAB — DIRECT ANTIGLOBULIN TEST (NOT AT ARMC)
DAT, IgG: NEGATIVE
DAT, complement: NEGATIVE

## 2023-05-26 LAB — LACTATE DEHYDROGENASE: LDH: 2028 U/L — ABNORMAL HIGH (ref 98–192)

## 2023-05-26 LAB — FOLATE: Folate: 15 ng/mL (ref >5.9)

## 2023-05-26 LAB — FOLATE RBC
Folate, Hemolysate: 345 ng/mL
Folate, RBC: 1177 ng/mL (ref >498)
Hematocrit: 29.3 % — ABNORMAL LOW (ref 37.5–51.0)

## 2023-05-26 LAB — DIC (DISSEMINATED INTRAVASCULAR COAGULATION)PANEL
D-Dimer, Quant: >20 ug/mL-FEU — ABNORMAL HIGH (ref 0.00–0.50)
Fibrinogen: 102 mg/dL — ABNORMAL LOW (ref 210–475)
INR: 1.7 — ABNORMAL HIGH (ref 0.8–1.2)
Platelets: 31 10*3/uL — ABNORMAL LOW (ref 150–400)
Prothrombin Time: 20.6 seconds — ABNORMAL HIGH (ref 11.4–15.2)
Smear Review: NONE SEEN
aPTT: 46 seconds — ABNORMAL HIGH (ref 24–36)

## 2023-05-26 LAB — VALPROIC ACID LEVEL: Valproic Acid Lvl: 48 ug/mL — ABNORMAL LOW (ref 50.0–100.0)

## 2023-05-26 LAB — HEMOGLOBIN A1C
Hgb A1c MFr Bld: 5.7 % — ABNORMAL HIGH (ref 4.8–5.6)
Mean Plasma Glucose: 116.89 mg/dL

## 2023-05-26 LAB — MAGNESIUM: Magnesium: 2.2 mg/dL (ref 1.7–2.4)

## 2023-05-26 LAB — PROTIME-INR
INR: 2.3 — ABNORMAL HIGH (ref 0.8–1.2)
Prothrombin Time: 25.7 seconds — ABNORMAL HIGH (ref 11.4–15.2)

## 2023-05-26 LAB — PHOSPHORUS: Phosphorus: 7.7 mg/dL — ABNORMAL HIGH (ref 2.5–4.6)

## 2023-05-26 LAB — VITAMIN B12: Vitamin B-12: 1893 pg/mL — ABNORMAL HIGH (ref 180–914)

## 2023-05-26 MED ORDER — ALBUMIN HUMAN 5 % IV SOLN
25.0000 g | Freq: Once | INTRAVENOUS | Status: AC
  Administered 2023-05-26: 25 g via INTRAVENOUS
  Filled 2023-05-26: qty 500

## 2023-05-26 MED ORDER — INSULIN ASPART 100 UNIT/ML IJ SOLN: 0.0000 [IU] | Freq: Every day | INTRAMUSCULAR | Status: DC

## 2023-05-26 MED ORDER — SODIUM CHLORIDE 0.9% IV SOLUTION: Freq: Once | INTRAVENOUS | Status: AC

## 2023-05-26 MED ORDER — LEVETIRACETAM 500 MG PO TABS: 500.0000 mg | ORAL_TABLET | Freq: Two times a day (BID) | ORAL | Status: DC

## 2023-05-26 MED ORDER — POTASSIUM CHLORIDE 20 MEQ PO PACK
40.0000 meq | PACK | Freq: Once | ORAL | Status: AC
  Administered 2023-05-26: 40 meq
  Filled 2023-05-26: qty 2

## 2023-05-26 MED ORDER — PANTOPRAZOLE SODIUM 40 MG IV SOLR
40.0000 mg | Freq: Two times a day (BID) | INTRAVENOUS | Status: DC
  Administered 2023-05-26 – 2023-06-05 (×22): 40 mg via INTRAVENOUS
  Filled 2023-05-26 (×23): qty 10

## 2023-05-26 MED ORDER — LEVETIRACETAM 500 MG PO TABS
500.0000 mg | ORAL_TABLET | Freq: Two times a day (BID) | ORAL | Status: DC
  Administered 2023-05-26 – 2023-05-27 (×2): 500 mg via ORAL
  Filled 2023-05-26 (×2): qty 1

## 2023-05-26 MED ORDER — POLYETHYLENE GLYCOL 3350 17 G PO PACK: 17.0000 g | PACK | Freq: Every day | ORAL | Status: DC | PRN

## 2023-05-26 MED ORDER — STERILE WATER FOR INJECTION IV SOLN
INTRAVENOUS | Status: DC
  Filled 2023-05-26 (×3): qty 1000
  Filled 2023-05-26: qty 150
  Filled 2023-05-26: qty 1000

## 2023-05-26 MED ORDER — THIAMINE MONONITRATE 100 MG PO TABS
100.0000 mg | ORAL_TABLET | Freq: Every day | ORAL | Status: DC
  Administered 2023-05-26: 100 mg
  Filled 2023-05-26: qty 1

## 2023-05-26 MED ORDER — DOCUSATE SODIUM 50 MG/5ML PO LIQD: 100.0000 mg | Freq: Two times a day (BID) | ORAL | Status: DC | PRN

## 2023-05-26 MED ORDER — INSULIN ASPART 100 UNIT/ML IJ SOLN: 0.0000 [IU] | Freq: Three times a day (TID) | INTRAMUSCULAR | Status: DC

## 2023-05-26 MED ORDER — CALCIUM GLUCONATE-NACL 2-0.675 GM/100ML-% IV SOLN
2.0000 g | Freq: Once | INTRAVENOUS | Status: AC
  Administered 2023-05-26: 2000 mg via INTRAVENOUS
  Filled 2023-05-26: qty 100

## 2023-05-26 MED ORDER — OXYCODONE-ACETAMINOPHEN 7.5-325 MG PO TABS
1.0000 | ORAL_TABLET | ORAL | Status: DC | PRN
  Administered 2023-05-26 – 2023-05-27 (×2): 1 via ORAL
  Filled 2023-05-26 (×2): qty 1

## 2023-05-26 MED ORDER — WHITE PETROLATUM EX OINT
TOPICAL_OINTMENT | CUTANEOUS | Status: DC | PRN
  Filled 2023-05-26: qty 28.35

## 2023-05-26 MED ORDER — NOREPINEPHRINE 16 MG/250ML-% IV SOLN
0.0000 ug/min | INTRAVENOUS | Status: DC
  Administered 2023-05-26: 16 ug/min via INTRAVENOUS
  Administered 2023-05-26: 40 ug/min via INTRAVENOUS
  Administered 2023-05-26: 50 ug/min via INTRAVENOUS
  Administered 2023-05-27: 6 ug/min via INTRAVENOUS
  Administered 2023-05-28: 14 ug/min via INTRAVENOUS
  Filled 2023-05-26 (×5): qty 250

## 2023-05-26 MED ORDER — POTASSIUM CHLORIDE CRYS ER 20 MEQ PO TBCR
40.0000 meq | EXTENDED_RELEASE_TABLET | Freq: Once | ORAL | Status: AC
  Administered 2023-05-26: 40 meq via ORAL
  Filled 2023-05-26: qty 2

## 2023-05-26 MED ORDER — SODIUM BICARBONATE 8.4 % IV SOLN
200.0000 meq | Freq: Once | INTRAVENOUS | Status: AC
  Administered 2023-05-26: 200 meq via INTRAVENOUS
  Filled 2023-05-26: qty 200

## 2023-05-26 MED ORDER — POTASSIUM CHLORIDE 20 MEQ PO PACK
40.0000 meq | PACK | Freq: Four times a day (QID) | ORAL | Status: DC
  Administered 2023-05-26: 40 meq
  Filled 2023-05-26 (×2): qty 2

## 2023-05-26 MED ORDER — POTASSIUM CHLORIDE 10 MEQ/100ML IV SOLN
10.0000 meq | INTRAVENOUS | Status: AC
  Administered 2023-05-26 (×2): 10 meq via INTRAVENOUS
  Filled 2023-05-26 (×2): qty 100

## 2023-05-26 MED ORDER — POLYETHYLENE GLYCOL 3350 17 G PO PACK: 17.0000 g | PACK | Freq: Every day | ORAL | Status: DC

## 2023-05-26 MED ORDER — SODIUM CHLORIDE 0.9 % IV SOLN
4.0000 g | Freq: Once | INTRAVENOUS | Status: AC
  Administered 2023-05-26: 4 g via INTRAVENOUS
  Filled 2023-05-26: qty 40

## 2023-05-26 MED ORDER — DOCUSATE SODIUM 50 MG/5ML PO LIQD
100.0000 mg | Freq: Two times a day (BID) | ORAL | Status: DC
  Filled 2023-05-26: qty 10

## 2023-05-26 MED ORDER — LEVETIRACETAM IN NACL 1000 MG/100ML IV SOLN
1000.0000 mg | Freq: Once | INTRAVENOUS | Status: AC
  Administered 2023-05-26: 1000 mg via INTRAVENOUS
  Filled 2023-05-26: qty 100

## 2023-05-26 MED ORDER — THIAMINE MONONITRATE 100 MG PO TABS
100.0000 mg | ORAL_TABLET | Freq: Every day | ORAL | Status: DC
  Administered 2023-05-27: 100 mg via ORAL
  Filled 2023-05-26: qty 1

## 2023-05-26 MED ORDER — CHLORHEXIDINE GLUCONATE CLOTH 2 % EX PADS
6.0000 | MEDICATED_PAD | Freq: Every day | CUTANEOUS | Status: DC
  Administered 2023-05-26 – 2023-06-06 (×14): 6 via TOPICAL

## 2023-05-26 NOTE — Progress Notes (Signed)
Patient's ABG results not crossing over to epic from I stat machine.  Ph 7.46 PC02 32 P02 146 HC03 22.9  Critical value iCa 0.83 MD and RN notified

## 2023-05-26 NOTE — Progress Notes (Signed)
eLink Physician-Brief Progress Note Patient Name: Timothy Hubbard DOB: 12-20-1990 MRN: [id number]   Date of Service  05/26/2023  HPI/Events of Note  Pt complaining of moderate-severe back pain.  Appears to be musculoskeletal in nature, may be related to chest compressions administered during cardiac arrest episode.   eICU Interventions  Placed order for oxycodone-APAP PRN.  Will follow response.         Timothy Hubbard 05/26/2023, 10:15 PM  2:06 AM Pt with decreased UO, over the past 7 hours.  Currently on epinephrine at 7mg /min, vasopressin.  Bicarb drip still running @175ml /hr I/Os show pt is already net positive 12L since admission Cannot start diuretic therapy yet.  Will give volume in the form of albumin.  Will follow response.    5:47 AM Pt with Hgb back down to 6.3, plts 19,000 No evidence of ongoing bleed.  Will transfuse 1 unit pRB now.  Will continue to monitor closely.   Ca persistently low at 5.9 One time dose of calcium gluconate 4g IV ordered.

## 2023-05-26 NOTE — Procedures (Addendum)
Patient Name: Timothy Hubbard  MRN: [id number]  Epilepsy Attending: Charlsie Quest  Referring Physician/Provider: Rejeana Brock, MD  Duration: 05/25/2023 1749 to 05/26/2023 1051  Patient history: 32 year old male with history of seizures, hypertension and asthma who presented after had witnessed seizure. EEG to evaluate for seizure  Level of alertness: Awake, asleep  AEDs during EEG study: VPA  Technical aspects: This EEG study was done with scalp electrodes positioned according to the 10-20 International system of electrode placement. Electrical activity was reviewed with band pass filter of 1-70Hz , sensitivity of 7 uV/mm, display speed of 75mm/sec with a 60Hz  notched filter applied as appropriate. EEG data were recorded continuously and digitally stored.  Video monitoring was available and reviewed as appropriate.  Description: The posterior dominant rhythm consists of 8-9 Hz activity of moderate voltage (25-35 uV) seen predominantly in posterior head regions, symmetric and reactive to eye opening and eye closing. Sleep was characterized by vertex waves, sleep spindles (12 to 14 Hz), maximal frontocentral region. EEG showed continuous generalized 3 to 6 Hz theta-delta slowing. Hyperventilation and photic stimulation were not performed.     ABNORMALITY - Continuous slow, generalized  IMPRESSION: This study is suggestive of moderate diffuse encephalopathy. No seizures or epileptiform discharges were seen throughout the recording.  Deliliah Spranger [name]

## 2023-05-26 NOTE — Progress Notes (Signed)
NAME:  Timothy Hubbard, MRN:  [id number], DOB:  Aug 26, 1991, LOS: 1 ADMISSION DATE:  05/25/2023, CONSULTATION DATE:  9/19 REFERRING MD:  Dr. Rhae Hammock, CHIEF COMPLAINT:  seizure/cardiac arrest   History of Present Illness:  Patient is a [name] w/ pertinent PMH seizures, asthma, htn, mood disorder presents to [facility] ED on 9/19 w/ seizure and cardiac arrest.  Per family patient compliant w/ seizure medications.  Had a witnessed seizure at home. EMS arrived and patient unresponsive. On route to ED patient became bradycardic 20-30 and pea arrested. 1 round cpr performed. Patient breathing spontaneously post arrest. Started on epi drip. On ED arrival patient altered but withdrew to painful stimuli. During intubation patient pea arrested and rosc after 1 round of cpr. EKG sinus tachy 130s. Patient remained hypotensive post intubation and cvl placed. Currently on 3 pressors. Temp 92 F and placed on bair hugger. LA 14. Given IV fluids and cultures obtained and placed on broad spectrum antibiotics. CXR w/ ett in good position; no infiltrate appreciated. Abg 6.88, 56, 379, 11. Given 2 amps of bicarb and ordered bicarb drip. CTA head, abd, pelvis ordered/pending. PCCM consulted for icu admission.  Pertinent  Medical History   Past Medical History:  Diagnosis Date   Acute encephalopathy 03/10/2022   Adenomatous polyp of descending colon    Asthma    Encephalopathy acute 03/01/2022   Hematochezia    HTN (hypertension)    Hypokalemia 03/04/2022   Need for hepatitis C screening test 04/27/2022   Seizures (HCC)      Significant Hospital Events: Including procedures, antibiotic start and stop dates in addition to other pertinent events   9/19 admit to [facility]; seizure and cardiac arrest 9/20 extubated  Interim History / Subjective:   Ongoing IVF resuscitation overnight, started on sodium bicarbonate, insulin drip. Continued increases in vasopressor requirements raising concern that he needs CT abdomen/pelvis. Given  500 cc 5% albumin and 2 units PRBC ordered. Electrolytes repleted.   Objective   Blood pressure 101/62, pulse (!) 122, temperature 99.3 F (37.4 C), temperature source Bladder, resp. rate (!) 27, weight 58.6 kg, SpO2 100%.    Vent Mode: PRVC FiO2 (%):  [50 %-100 %] 50 % Set Rate:  [18 bmp-30 bmp] 30 bmp Vt Set:  [510 mL] 510 mL PEEP:  [5 cmH20] 5 cmH20 Plateau Pressure:  [16 cmH20-18 cmH20] 18 cmH20   Intake/Output Summary (Last 24 hours) at 05/26/2023 0826 Last data filed at 05/26/2023 0800 Gross per 24 hour  Intake 7275.38 ml  Output 1005 ml  Net 6270.38 ml   Filed Weights   05/25/23 1740  Weight: 58.6 kg    Examination: Constitutional:Acutely ill appearing, resting comforably, in no acute distress. HENT:Dried blood around his lips Eyes:Non-icteric. Cardio:Tachycardia with rhythm. No murmurs, rubs, or gallops. Pulm:Clear to auscultation bilaterally. Normal work of breathing on 4L [place]. Abdomen:Soft, nontender, nondistended. EXB:MWUXLKGM for extremity edema. Skin:No obvious rashes or lesions. Hands are cool to touch. Neuro:Alert and oriented x3. No focal deficit noted. Psych:Pleasant mood and affect.  Labs: BMP: Na 134, K 2.7, Cl 93, CO2 12, glucose 431, BUN 13/Cr 2.80, Ca 6.3, GFR 30, AG 29 CBC: WBC 2.1 (8.0), Hgb 5.4 (12.6), PLTs 64 (122) Mg 2.2 Phos 7.7 PT-INR 25.7-2.3 (>90.0->10.0) ABG pH 7.203/pCO2 29.9/pO2 37/bicarbonate 11.8/TCO2 13 Blood, respiratory cultures pending  Infusions: Epinephrine  Fentanyl Insulin Norepinephrine Vasopressin  Resolved Hospital Problem list   N/A  Assessment & Plan:   S/p PEA arrest Shock, distributive Distributive shock in setting  of severe acidosis.  Plan: -Continue levophed, epinephrine, vasopressin; wean off as able with MAP goal >65 -Continue telemetry monitoring -F/u echocardiogram -F/u blood, urine cultures -Continue zosyn -Continue bicarbonate infusion; give additional 4 amps of bicarbonate now -Trend lactic  acid  Breakthrough seizures Acute metabolic encephalopathy 2/2 acidosis Neurology following. Valproic acid level subtherapeutic. UDS pending. Will discuss changing AED from valproic acid in setting of shock liver. Plan: -AED adjustments per neurology -Recheck CK -Continue LTM EEG -Avoid deep sedation -Seizure precautions -F/u UDS  Mixed metabolic and respiratory acidosis Acute hypoxic respiratory failure Hx of asthma Successfully extubated today.  Plan: -Continue brovana, pulmicort, PRN duoneb  AKI 2/2 ischemic ATN Shock liver Anion gap metabolic acidosis Lactic acidosis  Last lactic >9.0. In setting of witnessed seizure, shock, PEA arrest, shock liver, AKI, high epinephrine requirement. Plan: -Trend renal function, LFTs, UOP -F/u UA -Replace electrolytes as indicated -Will start high dose thiamine -Recheck LFTs now -Avoid nephrotoxic, hepatotoxic agents, ensure adequate renal perfusion  Hypocalcemia Hypokalemia Several complicating factors contributing including insulin drip, high phosphate level, receiving bicarbonate.  Plan: -Trend electrolytes, replete as indicated  Pancytopenia of critical illness Concern for possible hemolysis at this time, cannot rule out TTP as well. Plan: -Trend CBC, transfuse for Hgb <7 -F/u LDH, heptoglobin, coombs test, DIC panel, ADAMTS13 labs  Hx HTN Plan: -Home anti-hypertensives on hold  Mood disorder Plan: -Home zyprexa on hold  Best Practice (right click and "Reselect all SmartList Selections" daily)   Diet/type: NPO w/ meds via tube DVT prophylaxis: LMWH GI prophylaxis: H2B Lines: Central line Foley:  Yes, and it is still needed Code Status:  full code Last date of multidisciplinary goals of care discussion [pending]  Labs   CBC: Recent Labs  Lab 05/25/23 1404 05/25/23 1414 05/25/23 1429 05/26/23 0454 05/26/23 0507  WBC 8.0  --   --   --  2.1*  NEUTROABS 4.8  --   --   --   --   HGB 11.3* 12.2* 12.6* 5.1*  5.4*  HCT 37.3* 36.0* 37.0* 15.0* 17.6*  MCV 109.4*  --   --   --  104.8*  PLT 122*  --   --   --  64*    Basic Metabolic Panel: Recent Labs  Lab 05/22/23 1213 05/25/23 1404 05/25/23 1414 05/25/23 1429 05/25/23 1532 05/26/23 0454 05/26/23 0507  NA 139 140 136 135  --  133* 134*  K 4.8 4.5 4.4 3.7  --  2.7* 2.7*  CL 99 101 104  --   --   --  93*  CO2 17* 8*  --   --   --   --  12*  GLUCOSE 51* 144* 130*  --   --   --  431*  BUN 9 11 12   --   --   --  13  CREATININE 0.64* 2.38* 2.00*  --   --   --  2.80*  CALCIUM 8.9 7.2*  --   --   --   --  6.3*  MG  --   --   --   --  1.8  --  2.2  PHOS  --   --   --   --   --   --  7.7*   GFR: Estimated Creatinine Clearance: 31.1 mL/min (A) (by C-G formula based on SCr of 2.8 mg/dL (H)). Recent Labs  Lab 05/25/23 1404 05/25/23 1414 05/25/23 1420 05/25/23 1532 05/26/23 0128 05/26/23 0507  PROCALCITON  --   --   --  0.60  --   --   WBC 8.0  --   --   --   --  2.1*  LATICACIDVEN  --  13.8* 14.3*  --  >9.0*  --     Liver Function Tests: Recent Labs  Lab 05/25/23 1404  AST 3,849*  ALT 1,659*  ALKPHOS 46  BILITOT 1.6*  PROT 4.1*  ALBUMIN 2.1*   No results for input(s): "LIPASE", "AMYLASE" in the last 168 hours. No results for input(s): "AMMONIA" in the last 168 hours.  ABG    Component Value Date/Time   PHART 7.203 (L) 05/26/2023 0454   PCO2ART 29.9 (L) 05/26/2023 0454   PO2ART 37 (LL) 05/26/2023 0454   HCO3 11.8 (L) 05/26/2023 0454   TCO2 13 (L) 05/26/2023 0454   ACIDBASEDEF 15.0 (H) 05/26/2023 0454   O2SAT 59 05/26/2023 0454     Coagulation Profile: Recent Labs  Lab 05/25/23 1532 05/26/23 0507  INR >10.0* 2.3*    Cardiac Enzymes: Recent Labs  Lab 05/25/23 1532  CKTOTAL 237    HbA1C: Hgb A1c MFr Bld  Date/Time Value Ref Range Status  03/03/2022 08:42 AM <4.2 (L) 4.8 - 5.6 % Final    Comment:    (NOTE) **Verified by repeat analysis**         Prediabetes: 5.7 - 6.4         Diabetes: >6.4          Glycemic control for adults with diabetes: <7.0     CBG: Recent Labs  Lab 05/26/23 0415 05/26/23 0449 05/26/23 0531 05/26/23 0633 05/26/23 0738  GLUCAP 426* 401* 389* 367* 272*    Review of Systems:   Patient is encephalopathic and/or intubated; therefore, history has been obtained from chart review.    Past Medical History:  He,  has a past medical history of Acute encephalopathy (03/10/2022), Adenomatous polyp of descending colon, Asthma, Encephalopathy acute (03/01/2022), Hematochezia, HTN (hypertension), Hypokalemia (03/04/2022), Need for hepatitis C screening test (04/27/2022), and Seizures (HCC).   Surgical History:   Past Surgical History:  Procedure Laterality Date   COLONOSCOPY WITH PROPOFOL N/A 03/14/2022   Procedure: COLONOSCOPY WITH PROPOFOL;  Surgeon: Shellia Cleverly, DO;  Location: [facility] ENDOSCOPY;  Service: Gastroenterology;  Laterality: N/A;   POLYPECTOMY  03/14/2022   Procedure: POLYPECTOMY;  Surgeon: Shellia Cleverly, DO;  Location: [facility] ENDOSCOPY;  Service: Gastroenterology;;     Social History:   reports that he has been smoking cigarettes. He started smoking about 15 years ago. He has a 7.9 pack-year smoking history. He has never used smokeless tobacco. He reports current alcohol use of about 42.0 standard drinks of alcohol per week. He reports current drug use. Frequency: 7.00 times per week. Drug: Marijuana.   Family History:  His family history includes Cancer in his maternal grandfather; Hypertension in his mother.   Allergies Allergies  Allergen Reactions   Ibuprofen Anaphylaxis     Home Medications  Prior to Admission medications   Medication Sig Start Date End Date Taking? Authorizing Provider  acetaminophen (TYLENOL) 325 MG tablet Take 2 tablets (650 mg total) by mouth every 6 (six) hours as needed for mild pain or moderate pain. 03/30/22   Littie Deeds, MD  albuterol (PROVENTIL) (2.5 MG/3ML) 0.083% nebulizer solution Take 3 mLs (2.5 mg total)  by nebulization every 6 (six) hours as needed for wheezing or shortness of breath. 04/04/23   Vonna Drafts, MD  amLODipine (NORVASC) 10 MG tablet Take 1 tablet (10 mg total) by mouth at  bedtime. 05/22/23   Hindel, Tacey Ruiz, MD  divalproex (DEPAKOTE) 500 MG DR tablet Take 1 tablet (500 mg total) by mouth 2 (two) times daily. 04/04/23   Vonna Drafts, MD  feeding supplement (ENSURE ENLIVE / ENSURE PLUS) LIQD Take 237 mLs by mouth 2 (two) times daily between meals. 04/24/23   Alwyn Ren, MD  ferrous sulfate 324 (65 Fe) MG TBEC Take 1 tablet (325 mg total) by mouth daily at 12 noon. 04/04/23   Vonna Drafts, MD  fluconazole (DIFLUCAN) 200 MG tablet Take 0.5 tablets (100 mg total) by mouth daily. 04/24/23   Alwyn Ren, MD  fluticasone [facility]) 50 MCG/ACT nasal spray Place 2 sprays into both nostrils daily. Patient taking differently: Place 2 sprays into both nostrils daily as needed for allergies. 04/04/23   Vonna Drafts, MD  folic acid (FOLVITE) 1 MG tablet TAKE 1 TABLET (1 MG TOTAL) BY MOUTH DAILY (AM) Patient taking differently: Take 1 mg by mouth daily. 04/04/23   Vonna Drafts, MD  hydrocortisone (ANUSOL-HC) 2.5 % rectal cream Place 1 Application rectally 2 (two) times daily. 04/04/23   Vonna Drafts, MD  lidocaine (XYLOCAINE) 2 % solution Use as directed 15 mLs in the mouth or throat every 4 (four) hours as needed for mouth pain. 04/23/23   Alwyn Ren, MD  lisinopril (ZESTRIL) 5 MG tablet Take 1 tablet (5 mg total) by mouth at bedtime. 05/22/23   Hindel, Tacey Ruiz, MD  mometasone-formoterol (DULERA) 100-5 MCG/ACT AERO INHALE TWO PUFFS INTO THE LUNGS TWICE A DAY Patient taking differently: Inhale 2 puffs into the lungs 2 (two) times daily. 04/04/23   Vonna Drafts, MD  Multiple Vitamin (MULTIVITAMIN WITH MINERALS) TABS tablet Take 1 tablet by mouth daily. 04/04/23   Vonna Drafts, MD  naltrexone (DEPADE) 50 MG tablet TAKE 1/2 TABLET (25 MG TOTAL) BY MOUTH DAILY (AM) Patient taking  differently: Take 25 mg by mouth daily. 04/04/23   Vonna Drafts, MD  nystatin (MYCOSTATIN) 100000 UNIT/ML suspension Take 5 mLs (500,000 Units total) by mouth 4 (four) times daily. 04/23/23   Alwyn Ren, MD  OLANZapine (ZYPREXA) 5 MG tablet Take 1 tablet (5 mg total) by mouth at bedtime. Future refills will need to come from psychiatry. 04/04/23   Vonna Drafts, MD  pantoprazole (PROTONIX) 40 MG tablet TAKE 1 TABLET (40 MG TOTAL) BY MOUTH DAILY (AM) Patient taking differently: Take 40 mg by mouth daily. 04/04/23   Vonna Drafts, MD  Pedialyte (PEDIALYTE) SOLN Take 240 mLs by mouth every 6 (six) hours. 04/23/23   Alwyn Ren, MD  polyethylene glycol (MIRALAX / GLYCOLAX) 17 g packet MIX AND DRINK BY MOUTH DAILY AS NEEDED FOR MODERATE CONSTIPATION Patient not taking: Reported on 04/22/2023 04/04/23   Vonna Drafts, MD  VENTOLIN HFA 108 (90 Base) MCG/ACT inhaler Inhale 1-2 puffs into the lungs every 6 (six) hours as needed for wheezing or shortness of breath. 04/04/23   Vonna Drafts, MD     Critical care time: 45 minutes      Champ Mungo, DO Internal Medicine PGY-3 05/26/2023

## 2023-05-26 NOTE — Procedures (Signed)
Extubation Procedure Note  Patient Details:   Name: Timothy Hubbard DOB: [birth year] MRN: [id number]   Airway Documentation:    Vent end date: 05/26/23 Vent end time: 1025   Evaluation  O2 sats: stable throughout Complications: No apparent complications Patient did tolerate procedure well. Bilateral Breath Sounds: Clear, Diminished    Patient extubated per written order. Cuff leak present prior to extubating, no stridor post extubation. Patient placed on 4L [place] and tolerating well.      Yes  Timothy Hubbard 05/26/2023, 10:32 AM

## 2023-05-26 NOTE — Progress Notes (Signed)
eLink Physician-Brief Progress Note Patient Name: Timothy Hubbard DOB: 1991-05-15 MRN: [id number]   Date of Service  05/26/2023  HPI/Events of Note  Patient on escalating doses of norepinephrine.  This 5.4, down from 12.6.  No evidence of bleeding.    eICU Interventions  He will need a CT scan of his abdomen and pelvis to make sure he does not have any retroperitoneal or intraperitoneal bleed.  For now however, we will template patient by giving him some volume with 500 cc of 5% albumin and 2 units of packed red cells.  Still more stable, he can go down for a CT of his abdomen and pelvis to rule out bleed. He became hypocalcemic.  Electrolytes have been repleted. Plan of care has been discussed with bedside nurse.     Intervention Category Major Interventions: Shock - evaluation and management  Carilyn Goodpasture 05/26/2023, 6:26 AM

## 2023-05-26 NOTE — Progress Notes (Signed)
  Echocardiogram 2D Echocardiogram has been performed.  Timothy Hubbard 05/26/2023, 10:13 AM

## 2023-05-26 NOTE — Progress Notes (Signed)
Subjective: Markedly improved this morning, states that his medications were being changed, but he is not sure what he was being changed to, he was being taken off of Depakote, but not sure why.  Exam: Vitals:   05/26/23 1035 05/26/23 1050  BP: (!) 154/135 111/74  Pulse: (!) 134 (!) 124  Resp: (!) 23 19  Temp: 99.1 F (37.3 C) 99.1 F (37.3 C)  SpO2: 100% 100%   Gen: In bed, NAD Resp: non-labored breathing, no acute distress Abd: soft, nt  Neuro: MS: Awake, alert, oriented.  He is very hoarse due to being recently extubated CN: Visual fields full, EOMI, face symmetric Motor: Able to lift all extremities against gravity Sensory: Intact to light touch  Pertinent Labs: Depakote level 25 on arrival  Impression: 32 year old male with a history of epilepsy who is managed on Depakote who presented with breakthrough seizure.  His LFTs are markedly elevated and therefore I will change him from Depakote to levetiracetam.  Recommendations: 1) Keppra 1 g x 1 followed by 500 mg twice daily 2) neurology will follow  Ritta Slot, MD [facility] [phone]  If 7pm- 7am, please page neurology on call as listed in AMION.

## 2023-05-26 NOTE — Progress Notes (Signed)
LTM EEG discontinued - no skin breakdown at unhook.  

## 2023-05-26 NOTE — Progress Notes (Signed)
PHARMACY - PHYSICIAN COMMUNICATION CRITICAL VALUE ALERT - BLOOD CULTURE IDENTIFICATION (BCID)  Timothy Hubbard is an 32 y.o. male who presented to [facility] on 05/25/2023 with a chief complaint of seizure and cardiac arrest.   Assessment:  32 year old male admitted wit shock/seizure/cardiac arrest. Blood cultures now showing 1/4 gram positive rods which is likely contamination.   Name of physician (or Provider) Contacted: Champ Mungo, DO  Current antibiotics: Zosyn   Changes to prescribed antibiotics recommended:  No changes recommended based on this culture. Narrow as appropriate.    Sharin Mons, PharmD, BCPS, BCIDP Infectious Diseases Clinical Pharmacist Phone: [phone] 05/26/2023  2:58 PM

## 2023-05-27 ENCOUNTER — Inpatient Hospital Stay (HOSPITAL_COMMUNITY): Payer: Medicare Other

## 2023-05-27 DIAGNOSIS — I469 Cardiac arrest, cause unspecified: Secondary | ICD-10-CM | POA: Diagnosis not present

## 2023-05-27 LAB — DIC (DISSEMINATED INTRAVASCULAR COAGULATION)PANEL
D-Dimer, Quant: >20 ug/mL-FEU — ABNORMAL HIGH (ref 0.00–0.50)
D-Dimer, Quant: >20 ug/mL-FEU — ABNORMAL HIGH (ref 0.00–0.50)
Fibrinogen: 232 mg/dL (ref 210–475)
Fibrinogen: 276 mg/dL (ref 210–475)
INR: 1.3 — ABNORMAL HIGH (ref 0.8–1.2)
INR: 1.2 (ref 0.8–1.2)
Platelets: 44 10*3/uL — ABNORMAL LOW (ref 150–400)
Platelets: 59 10*3/uL — ABNORMAL LOW (ref 150–400)
Prothrombin Time: 16.4 seconds — ABNORMAL HIGH (ref 11.4–15.2)
Prothrombin Time: 15.5 seconds — ABNORMAL HIGH (ref 11.4–15.2)
Smear Review: NONE SEEN
Smear Review: NONE SEEN
aPTT: 38 seconds — ABNORMAL HIGH (ref 24–36)
aPTT: 37 seconds — ABNORMAL HIGH (ref 24–36)

## 2023-05-27 LAB — POCT I-STAT 7, (LYTES, BLD GAS, ICA,H+H)
Acid-Base Excess: 3 mmol/L — ABNORMAL HIGH (ref 0.0–2.0)
Acid-Base Excess: 7 mmol/L — ABNORMAL HIGH (ref 0.0–2.0)
Acid-Base Excess: 8 mmol/L — ABNORMAL HIGH (ref 0.0–2.0)
Bicarbonate: 29.1 mmol/L — ABNORMAL HIGH (ref 20.0–28.0)
Bicarbonate: 32.8 mmol/L — ABNORMAL HIGH (ref 20.0–28.0)
Bicarbonate: 34.8 mmol/L — ABNORMAL HIGH (ref 20.0–28.0)
Calcium, Ion: 0.81 mmol/L — CL (ref 1.15–1.40)
Calcium, Ion: 0.73 mmol/L — CL (ref 1.15–1.40)
Calcium, Ion: 0.94 mmol/L — ABNORMAL LOW (ref 1.15–1.40)
HCT: 17 % — ABNORMAL LOW (ref 39.0–52.0)
HCT: 30 % — ABNORMAL LOW (ref 39.0–52.0)
HCT: 29 % — ABNORMAL LOW (ref 39.0–52.0)
Hemoglobin: 5.8 g/dL — CL (ref 13.0–17.0)
Hemoglobin: 10.2 g/dL — ABNORMAL LOW (ref 13.0–17.0)
Hemoglobin: 9.9 g/dL — ABNORMAL LOW (ref 13.0–17.0)
O2 Saturation: 99 %
O2 Saturation: 97 %
O2 Saturation: 98 %
Patient temperature: 97.5
Patient temperature: 97.5
Patient temperature: 36.1
Potassium: 3.8 mmol/L (ref 3.5–5.1)
Potassium: 3.9 mmol/L (ref 3.5–5.1)
Potassium: 3.9 mmol/L (ref 3.5–5.1)
Sodium: 135 mmol/L (ref 135–145)
Sodium: 136 mmol/L (ref 135–145)
Sodium: 134 mmol/L — ABNORMAL LOW (ref 135–145)
TCO2: 31 mmol/L (ref 22–32)
TCO2: 34 mmol/L — ABNORMAL HIGH (ref 22–32)
TCO2: 37 mmol/L — ABNORMAL HIGH (ref 22–32)
pCO2 arterial: 49.7 mmHg — ABNORMAL HIGH (ref 32–48)
pCO2 arterial: 51.2 mmHg — ABNORMAL HIGH (ref 32–48)
pCO2 arterial: 55.7 mmHg — ABNORMAL HIGH (ref 32–48)
pH, Arterial: 7.372 (ref 7.35–7.45)
pH, Arterial: 7.412 (ref 7.35–7.45)
pH, Arterial: 7.399 (ref 7.35–7.45)
pO2, Arterial: 131 mmHg — ABNORMAL HIGH (ref 83–108)
pO2, Arterial: 85 mmHg (ref 83–108)
pO2, Arterial: 103 mmHg (ref 83–108)

## 2023-05-27 LAB — CBC WITH DIFFERENTIAL/PLATELET
Abs Immature Granulocytes: 1.23 10*3/uL — ABNORMAL HIGH (ref 0.00–0.07)
Basophils Absolute: 0 10*3/uL (ref 0.0–0.1)
Basophils Relative: 1 %
Eosinophils Absolute: 0 10*3/uL (ref 0.0–0.5)
Eosinophils Relative: 0 %
HCT: 29 % — ABNORMAL LOW (ref 39.0–52.0)
Hemoglobin: 10 g/dL — ABNORMAL LOW (ref 13.0–17.0)
Immature Granulocytes: 24 %
Lymphocytes Relative: 11 %
Lymphs Abs: 0.5 10*3/uL — ABNORMAL LOW (ref 0.7–4.0)
MCH: 28 pg (ref 26.0–34.0)
MCHC: 34.5 g/dL (ref 30.0–36.0)
MCV: 81.2 fL (ref 80.0–100.0)
Monocytes Absolute: 0.1 10*3/uL (ref 0.1–1.0)
Monocytes Relative: 2 %
Neutro Abs: 3.2 10*3/uL (ref 1.7–7.7)
Neutrophils Relative %: 62 %
Platelets: 43 10*3/uL — ABNORMAL LOW (ref 150–400)
RBC: 3.57 MIL/uL — ABNORMAL LOW (ref 4.22–5.81)
RDW: 15.3 % (ref 11.5–15.5)
Smear Review: DECREASED
WBC: 5 10*3/uL (ref 4.0–10.5)
nRBC: 1 % — ABNORMAL HIGH (ref 0.0–0.2)

## 2023-05-27 LAB — LACTIC ACID, PLASMA
Lactic Acid, Venous: 7.3 mmol/L (ref 0.5–1.9)
Lactic Acid, Venous: >9 mmol/L (ref 0.5–1.9)
Lactic Acid, Venous: 3 mmol/L (ref 0.5–1.9)

## 2023-05-27 LAB — BASIC METABOLIC PANEL
Anion gap: 27 — ABNORMAL HIGH (ref 5–15)
Anion gap: 20 — ABNORMAL HIGH (ref 5–15)
BUN: 18 mg/dL (ref 6–20)
BUN: 16 mg/dL (ref 6–20)
CO2: 27 mmol/L (ref 22–32)
CO2: 30 mmol/L (ref 22–32)
Calcium: 6.6 mg/dL — ABNORMAL LOW (ref 8.9–10.3)
Calcium: 7.8 mg/dL — ABNORMAL LOW (ref 8.9–10.3)
Chloride: 83 mmol/L — ABNORMAL LOW (ref 98–111)
Chloride: 85 mmol/L — ABNORMAL LOW (ref 98–111)
Creatinine, Ser: 3.27 mg/dL — ABNORMAL HIGH (ref 0.61–1.24)
Creatinine, Ser: 2.83 mg/dL — ABNORMAL HIGH (ref 0.61–1.24)
GFR, Estimated: 25 mL/min — ABNORMAL LOW (ref >60)
GFR, Estimated: 29 mL/min — ABNORMAL LOW (ref >60)
Glucose, Bld: 112 mg/dL — ABNORMAL HIGH (ref 70–99)
Glucose, Bld: 131 mg/dL — ABNORMAL HIGH (ref 70–99)
Potassium: 4.1 mmol/L (ref 3.5–5.1)
Potassium: 3.9 mmol/L (ref 3.5–5.1)
Sodium: 137 mmol/L (ref 135–145)
Sodium: 135 mmol/L (ref 135–145)

## 2023-05-27 LAB — BPAM CRYOPRECIPITATE
Blood Product Expiration Date: 202409222359
Blood Product Expiration Date: 202409252359
ISSUE DATE / TIME: 202409201658
ISSUE DATE / TIME: 202409201658
Unit Type and Rh: 5100
Unit Type and Rh: 7300

## 2023-05-27 LAB — HEMOGLOBIN AND HEMATOCRIT, BLOOD
HCT: 20.6 % — ABNORMAL LOW (ref 39.0–52.0)
HCT: 27.5 % — ABNORMAL LOW (ref 39.0–52.0)
Hemoglobin: 7.2 g/dL — ABNORMAL LOW (ref 13.0–17.0)
Hemoglobin: 9.5 g/dL — ABNORMAL LOW (ref 13.0–17.0)

## 2023-05-27 LAB — AMMONIA: Ammonia: 34 umol/L (ref 9–35)

## 2023-05-27 LAB — GLUCOSE, CAPILLARY
Glucose-Capillary: 94 mg/dL (ref 70–99)
Glucose-Capillary: 56 mg/dL — ABNORMAL LOW (ref 70–99)
Glucose-Capillary: 71 mg/dL (ref 70–99)
Glucose-Capillary: 85 mg/dL (ref 70–99)
Glucose-Capillary: 132 mg/dL — ABNORMAL HIGH (ref 70–99)
Glucose-Capillary: 151 mg/dL — ABNORMAL HIGH (ref 70–99)
Glucose-Capillary: 130 mg/dL — ABNORMAL HIGH (ref 70–99)
Glucose-Capillary: 120 mg/dL — ABNORMAL HIGH (ref 70–99)

## 2023-05-27 LAB — COMPREHENSIVE METABOLIC PANEL
ALT: 543 U/L — ABNORMAL HIGH (ref 0–44)
AST: 2062 U/L — ABNORMAL HIGH (ref 15–41)
Albumin: 1.9 g/dL — ABNORMAL LOW (ref 3.5–5.0)
Alkaline Phosphatase: 23 U/L — ABNORMAL LOW (ref 38–126)
Anion gap: 13 (ref 5–15)
BUN: 16 mg/dL (ref 6–20)
CO2: 34 mmol/L — ABNORMAL HIGH (ref 22–32)
Calcium: 5.9 mg/dL — CL (ref 8.9–10.3)
Chloride: 84 mmol/L — ABNORMAL LOW (ref 98–111)
Creatinine, Ser: 3.03 mg/dL — ABNORMAL HIGH (ref 0.61–1.24)
GFR, Estimated: 27 mL/min — ABNORMAL LOW (ref >60)
Glucose, Bld: 166 mg/dL — ABNORMAL HIGH (ref 70–99)
Potassium: 3.6 mmol/L (ref 3.5–5.1)
Sodium: 131 mmol/L — ABNORMAL LOW (ref 135–145)
Total Bilirubin: 1.3 mg/dL — ABNORMAL HIGH (ref 0.3–1.2)
Total Protein: 3.6 g/dL — ABNORMAL LOW (ref 6.5–8.1)

## 2023-05-27 LAB — PREPARE CRYOPRECIPITATE
Unit division: 0
Unit division: 0

## 2023-05-27 LAB — TRIGLYCERIDES: Triglycerides: 220 mg/dL — ABNORMAL HIGH (ref <150)

## 2023-05-27 LAB — CBC
HCT: 18.1 % — ABNORMAL LOW (ref 39.0–52.0)
Hemoglobin: 6.3 g/dL — CL (ref 13.0–17.0)
MCH: 28.6 pg (ref 26.0–34.0)
MCHC: 34.8 g/dL (ref 30.0–36.0)
MCV: 82.3 fL (ref 80.0–100.0)
Platelets: 19 10*3/uL — CL (ref 150–400)
RBC: 2.2 MIL/uL — ABNORMAL LOW (ref 4.22–5.81)
RDW: 16.7 % — ABNORMAL HIGH (ref 11.5–15.5)
WBC: 7.9 10*3/uL (ref 4.0–10.5)
nRBC: 0.9 % — ABNORMAL HIGH (ref 0.0–0.2)

## 2023-05-27 LAB — PREPARE RBC (CROSSMATCH)

## 2023-05-27 LAB — MASSIVE TRANSFUSION PROTOCOL ORDER (BLOOD BANK NOTIFICATION)

## 2023-05-27 LAB — TROPONIN I (HIGH SENSITIVITY)
Troponin I (High Sensitivity): 210 ng/L (ref <18)
Troponin I (High Sensitivity): 240 ng/L (ref <18)

## 2023-05-27 LAB — MAGNESIUM: Magnesium: 1.6 mg/dL — ABNORMAL LOW (ref 1.7–2.4)

## 2023-05-27 LAB — FERRITIN: Ferritin: 2399 ng/mL — ABNORMAL HIGH (ref 24–336)

## 2023-05-27 MED ORDER — SODIUM CHLORIDE 0.9 % IV SOLN: INTRAVENOUS | Status: DC

## 2023-05-27 MED ORDER — ROCURONIUM BROMIDE 10 MG/ML (PF) SYRINGE
PREFILLED_SYRINGE | INTRAVENOUS | Status: AC
  Administered 2023-05-27: 80 mg
  Filled 2023-05-27: qty 10

## 2023-05-27 MED ORDER — DOCUSATE SODIUM 50 MG/5ML PO LIQD: 100.0000 mg | Freq: Two times a day (BID) | ORAL | Status: DC

## 2023-05-27 MED ORDER — MIDAZOLAM-SODIUM CHLORIDE 100-0.9 MG/100ML-% IV SOLN
0.0000 mg/h | INTRAVENOUS | Status: DC
  Administered 2023-05-27: 2 mg/h via INTRAVENOUS
  Administered 2023-05-28: 4 mg/h via INTRAVENOUS
  Administered 2023-05-28: 2 mg/h via INTRAVENOUS
  Administered 2023-05-29: 3 mg/h via INTRAVENOUS
  Filled 2023-05-27 (×3): qty 100

## 2023-05-27 MED ORDER — CALCIUM GLUCONATE-NACL 2-0.675 GM/100ML-% IV SOLN
2.0000 g | INTRAVENOUS | Status: AC
  Administered 2023-05-27 (×2): 2000 mg via INTRAVENOUS
  Filled 2023-05-27 (×2): qty 100

## 2023-05-27 MED ORDER — ORAL CARE MOUTH RINSE: 15.0000 mL | OROMUCOSAL | Status: DC | PRN

## 2023-05-27 MED ORDER — ETOMIDATE 2 MG/ML IV SOLN
INTRAVENOUS | Status: AC
  Administered 2023-05-27: 20 mg
  Filled 2023-05-27: qty 20

## 2023-05-27 MED ORDER — SODIUM CHLORIDE 0.9% IV SOLUTION: Freq: Once | INTRAVENOUS | Status: AC

## 2023-05-27 MED ORDER — SUCCINYLCHOLINE CHLORIDE 200 MG/10ML IV SOSY
PREFILLED_SYRINGE | INTRAVENOUS | Status: AC
  Filled 2023-05-27: qty 10

## 2023-05-27 MED ORDER — SODIUM CHLORIDE 0.9% FLUSH: 10.0000 mL | INTRAVENOUS | Status: DC | PRN

## 2023-05-27 MED ORDER — POLYETHYLENE GLYCOL 3350 17 G PO PACK: 17.0000 g | PACK | Freq: Every day | ORAL | Status: DC

## 2023-05-27 MED ORDER — FENTANYL CITRATE PF 50 MCG/ML IJ SOSY
50.0000 ug | PREFILLED_SYRINGE | INTRAMUSCULAR | Status: DC | PRN
  Administered 2023-05-27: 50 ug via INTRAVENOUS
  Administered 2023-05-27 – 2023-05-29 (×2): 100 ug via INTRAVENOUS
  Filled 2023-05-27 (×2): qty 2

## 2023-05-27 MED ORDER — ROCURONIUM BROMIDE 10 MG/ML (PF) SYRINGE
PREFILLED_SYRINGE | INTRAVENOUS | Status: AC
  Filled 2023-05-27: qty 10

## 2023-05-27 MED ORDER — ALBUMIN HUMAN 25 % IV SOLN
25.0000 g | Freq: Once | INTRAVENOUS | Status: AC
  Administered 2023-05-27: 25 g via INTRAVENOUS
  Filled 2023-05-27: qty 100

## 2023-05-27 MED ORDER — ETOMIDATE 2 MG/ML IV SOLN
INTRAVENOUS | Status: AC
  Filled 2023-05-27: qty 20

## 2023-05-27 MED ORDER — KETAMINE HCL 50 MG/5ML IJ SOSY
PREFILLED_SYRINGE | INTRAMUSCULAR | Status: AC
  Filled 2023-05-27: qty 10

## 2023-05-27 MED ORDER — ALBUMIN HUMAN 25 % IV SOLN
25.0000 g | Freq: Four times a day (QID) | INTRAVENOUS | Status: AC
  Administered 2023-05-27 – 2023-05-28 (×4): 25 g via INTRAVENOUS
  Filled 2023-05-27 (×2): qty 100

## 2023-05-27 MED ORDER — THIAMINE MONONITRATE 100 MG PO TABS: 100.0000 mg | ORAL_TABLET | Freq: Every day | ORAL | Status: DC

## 2023-05-27 MED ORDER — SODIUM CHLORIDE 0.9 % IV SOLN
4.0000 g | Freq: Once | INTRAVENOUS | Status: AC
  Administered 2023-05-27: 4 g via INTRAVENOUS
  Filled 2023-05-27: qty 40

## 2023-05-27 MED ORDER — FENTANYL CITRATE PF 50 MCG/ML IJ SOSY
PREFILLED_SYRINGE | INTRAMUSCULAR | Status: AC
  Administered 2023-05-27: 50 ug via INTRAVENOUS
  Filled 2023-05-27: qty 2

## 2023-05-27 MED ORDER — DOCUSATE SODIUM 50 MG/5ML PO LIQD
100.0000 mg | Freq: Two times a day (BID) | ORAL | Status: DC
  Filled 2023-05-27 (×2): qty 10

## 2023-05-27 MED ORDER — SODIUM CHLORIDE 0.9% FLUSH
10.0000 mL | Freq: Two times a day (BID) | INTRAVENOUS | Status: DC
  Administered 2023-05-27 – 2023-05-29 (×4): 10 mL
  Administered 2023-05-29: 40 mL
  Administered 2023-05-30: 10 mL
  Administered 2023-05-30: 40 mL
  Administered 2023-05-31: 20 mL
  Administered 2023-05-31 – 2023-06-01 (×2): 10 mL
  Administered 2023-06-01: 30 mL
  Administered 2023-06-02 – 2023-06-05 (×6): 10 mL
  Administered 2023-06-06: 30 mL
  Administered 2023-06-07 – 2023-06-11 (×10): 10 mL

## 2023-05-27 MED ORDER — MIDAZOLAM HCL 2 MG/2ML IJ SOLN
INTRAMUSCULAR | Status: AC
  Filled 2023-05-27: qty 2

## 2023-05-27 MED ORDER — SODIUM CHLORIDE 0.9 % IV SOLN: 4.0000 g | Freq: Once | INTRAVENOUS | Status: DC

## 2023-05-27 MED ORDER — SODIUM CHLORIDE 0.9% IV SOLUTION: Freq: Once | INTRAVENOUS | Status: DC

## 2023-05-27 MED ORDER — DEXMEDETOMIDINE HCL IN NACL 400 MCG/100ML IV SOLN
0.0000 ug/kg/h | INTRAVENOUS | Status: DC
  Filled 2023-05-27: qty 100

## 2023-05-27 MED ORDER — FENTANYL CITRATE PF 50 MCG/ML IJ SOSY
PREFILLED_SYRINGE | INTRAMUSCULAR | Status: AC
  Filled 2023-05-27: qty 2

## 2023-05-27 MED ORDER — ORAL CARE MOUTH RINSE
15.0000 mL | OROMUCOSAL | Status: DC
  Administered 2023-05-27 – 2023-06-01 (×55): 15 mL via OROMUCOSAL

## 2023-05-27 MED ORDER — MIDAZOLAM HCL 2 MG/2ML IJ SOLN
INTRAMUSCULAR | Status: AC
  Administered 2023-05-27: 2 mg
  Filled 2023-05-27: qty 2

## 2023-05-27 MED ORDER — PROPOFOL 1000 MG/100ML IV EMUL
5.0000 ug/kg/min | INTRAVENOUS | Status: DC
  Administered 2023-05-27: 5 ug/kg/min via INTRAVENOUS
  Administered 2023-05-27 (×2): 60 ug/kg/min via INTRAVENOUS
  Administered 2023-05-28: 50 ug/kg/min via INTRAVENOUS
  Filled 2023-05-27: qty 200
  Filled 2023-05-27: qty 100
  Filled 2023-05-27: qty 200

## 2023-05-27 MED ORDER — PROPOFOL 1000 MG/100ML IV EMUL
INTRAVENOUS | Status: AC
  Filled 2023-05-27: qty 100

## 2023-05-27 MED ORDER — IOHEXOL 350 MG/ML SOLN
75.0000 mL | Freq: Once | INTRAVENOUS | Status: AC | PRN
  Administered 2023-05-27: 75 mL via INTRAVENOUS

## 2023-05-27 MED ORDER — INSULIN ASPART 100 UNIT/ML IJ SOLN
0.0000 [IU] | INTRAMUSCULAR | Status: DC
  Administered 2023-05-27 – 2023-05-29 (×4): 1 [IU] via SUBCUTANEOUS

## 2023-05-27 MED ORDER — THIAMINE MONONITRATE 100 MG PO TABS
100.0000 mg | ORAL_TABLET | Freq: Every day | ORAL | Status: DC
  Administered 2023-05-28 – 2023-05-31 (×4): 100 mg
  Filled 2023-05-27 (×4): qty 1

## 2023-05-27 MED ORDER — POLYETHYLENE GLYCOL 3350 17 G PO PACK
17.0000 g | PACK | Freq: Every day | ORAL | Status: DC
  Filled 2023-05-27: qty 1

## 2023-05-27 MED ORDER — METHYLNALTREXONE BROMIDE 12 MG/0.6ML ~~LOC~~ SOLN
12.0000 mg | Freq: Once | SUBCUTANEOUS | Status: DC
  Filled 2023-05-27: qty 0.6

## 2023-05-27 MED ORDER — FENTANYL CITRATE PF 50 MCG/ML IJ SOSY
50.0000 ug | PREFILLED_SYRINGE | INTRAMUSCULAR | Status: DC | PRN
  Filled 2023-05-27: qty 1

## 2023-05-27 MED ORDER — DEXTROSE 50 % IV SOLN
INTRAVENOUS | Status: AC
  Administered 2023-05-27: 25 mL via INTRAVENOUS
  Filled 2023-05-27: qty 50

## 2023-05-27 MED ORDER — PHENYLEPHRINE 80 MCG/ML (10ML) SYRINGE FOR IV PUSH (FOR BLOOD PRESSURE SUPPORT)
PREFILLED_SYRINGE | INTRAVENOUS | Status: AC
  Filled 2023-05-27: qty 10

## 2023-05-27 MED ORDER — MIDAZOLAM BOLUS VIA INFUSION: 0.0000 mg | INTRAVENOUS | Status: DC | PRN

## 2023-05-27 MED ORDER — LEVETIRACETAM 500 MG PO TABS
500.0000 mg | ORAL_TABLET | Freq: Two times a day (BID) | ORAL | Status: DC
  Administered 2023-05-27 – 2023-05-31 (×9): 500 mg
  Filled 2023-05-27 (×11): qty 1

## 2023-05-27 NOTE — Progress Notes (Signed)
Patient intubated 7.5 25cm @lip . Pulled back to 23 cm @lip  per CCM at bedside.

## 2023-05-27 NOTE — Procedures (Signed)
Intubation Procedure Note  Timothy Hubbard  [id number]  1990-12-03  Date:05/27/23  Time:2:39 PM   Provider Performing:Bridie Colquhoun C Katrinka Blazing    Procedure: Intubation (31500)  Indication(s) Respiratory Failure  Consent Unable to obtain consent due to emergent nature of procedure.   Anesthesia None   Time Out Verified patient identification, verified procedure, site/side was marked, verified correct patient position, special equipment/implants available, medications/allergies/relevant history reviewed, required imaging and test results available.   Sterile Technique Usual hand hygeine, masks, and gloves were used   Procedure Description Patient positioned in bed supine.  Sedation given as noted above.  Patient was intubated with endotracheal tube using Glidescope.  View was Grade 1 full glottis .  Number of attempts was 1.  Colorimetric CO2 detector was consistent with tracheal placement.   Complications/Tolerance None; patient tolerated the procedure well. Chest X-ray is ordered to verify placement.   EBL Minimal   Specimen(s) None

## 2023-05-27 NOTE — Procedures (Signed)
Central Venous Catheter Insertion Procedure Note  Timothy Hubbard  [id number]  [birth year]  Date:05/27/23  Time:4:51 PM   Provider Performing:Javaya [name] A Wynona Neat   Procedure: Insertion of Non-tunneled Central Venous Catheter(36556) with US guidance (40981)   Indication(s) Medication administration  Consent Unable to obtain consent due to emergent nature of procedure.  Anesthesia Topical only with 1% lidocaine   Timeout Verified patient identification, verified procedure, site/side was marked, verified correct patient position, special equipment/implants available, medications/allergies/relevant history reviewed, required imaging and test results available.  Sterile Technique Maximal sterile technique including full sterile barrier drape, hand hygiene, sterile gown, sterile gloves, mask, hair covering, sterile ultrasound probe cover (if used).  Procedure Description Area of catheter insertion was cleaned with chlorhexidine and draped in sterile fashion.  With real-time ultrasound guidance a central venous catheter was placed into the right femoral vein. Nonpulsatile blood flow and easy flushing noted in all ports.  The catheter was sutured in place and sterile dressing applied.  Complications/Tolerance None; patient tolerated the procedure well. Chest X-ray is ordered to verify placement for internal jugular or subclavian cannulation.   Chest x-ray is not ordered for femoral cannulation.  EBL Minimal  Specimen(s) None

## 2023-05-27 NOTE — Progress Notes (Addendum)
eLink Physician-Brief Progress Note Patient Name: Timothy Hubbard DOB: February 10, 1991 MRN: [id number]   Date of Service  05/27/2023  HPI/Events of Note  DIC panel has resulted.  Improving fibrinogen, stable anemia.  Having some oozing from the mouth and IV sites.   LA >9 at 1514. Want to do a repeat  eICU Interventions  Repeat LA.  Discussed with RN.  On vaso/levo at 15. Has a foley- oliguric. Cr improving. S/p massive transfusion.      Intervention Category Intermediate Interventions: Other:;Thrombocytopenia - evaluation and management  Ranee Gosselin 05/27/2023, 9:10 PM  21:15  Radiology called.  Significant  volume of blood in abdomen. Moderate blood all throughout abdomen, mid to lower and in pelvis, surrounding liver.  Not able to locate source of bleeding.   - notified ground team. Dr Audrie Lia notes from 8,20 PM reviewed.  Will inform about CTA to him. Paged.  21:42 Dr Halford Decamp, surgeon called back. Discussed. - follow DIC panel and for any worsening of shock. If so to inform him. For now, no need to take him to surgery, since Hg/DIC stable, improving. INR 1.2, Hg > 9. On moderate dose of Levo/vaso.   02:05 Asking for vent orders Settings: 100%/ 8 PEEP/ 18 RR/ 520 TV ABG reviewed and discussed.  - decrease VT < 8 ml/ibw. Increase rate to 24. Wean fio2 as tolerated. Follow ABG after an hour,   03:16 Waiting for labs. Urine out put low.   03:49 AM ABG: getting alkalotic, respiratory.  Hg > 10 now. DIC pending. Decrease rate to 18.  Calcium gluconate ordered  Discussed.

## 2023-05-27 NOTE — Progress Notes (Signed)
Shift summary: Patient PEA arrested, receiving 2 rounds of epi, bicarb x1. Epi and vaso gtts restarted. Patient intubated and MTP initiated. Patient received 4U PRBCs, 4U FFP, 2U Platelets, and 1U of cryo. See MTP blood administration flowsheet for more information. Patient stabilized enough for transport to STAT CT head/ abd / pelvis.

## 2023-05-27 NOTE — Progress Notes (Signed)
Patient transported to CT scan .

## 2023-05-27 NOTE — Consult Note (Signed)
Timothy Hubbard Jul 18, 1991  [id number].    Requesting MD: Katrinka Blazing Chief Complaint/Reason for Consult: C/f hemoperitoneum  HPI:  32 y/o M with a hx of seizures who initially presented 9/19 after he had a witnessed seizure at home.  While on route to the hospital he became bradycardiac and experienced an arrest. ROSC was achieved following one round of CPR.  Upon arrival he underwent a CT AP that did not show any acute findings.  Labs at that time were notable for a Hb of 12. On 9/20 his Hb was <6 and he was requiring pressors despite no obvious course of bleeding.  He continued to require transfusions throughout the day, and on 9/21 experienced another code event that required 2 rounds of CPR. A bedside ultrasound was performed and showed a large volume of intra-peritoneal fluid.  The fluid was aspirated and appeared to be frank blood.  Surgery was consulted to evaluate.  At the time of my exam, patient is intubated and sedated.  He is on 2 pressors and has receiving 3 pRBCs today as well as FFP and cryo.  There is minimal bloody output from his OG but no melena per nursing.  His abdomen is distended but relatively soft.    ROS: Not obtained due to patient factors  Family History  Problem Relation Age of Onset   Hypertension Mother    Cancer Maternal Grandfather        doesn't know the type    Past Medical History:  Diagnosis Date   Acute encephalopathy 03/10/2022   Adenomatous polyp of descending colon    Asthma    Encephalopathy acute 03/01/2022   Hematochezia    HTN (hypertension)    Hypokalemia 03/04/2022   Need for hepatitis C screening test 04/27/2022   Seizures (HCC)     Past Surgical History:  Procedure Laterality Date   COLONOSCOPY WITH PROPOFOL N/A 03/14/2022   Procedure: COLONOSCOPY WITH PROPOFOL;  Surgeon: Shellia Cleverly, DO;  Location: [facility];  Service: Gastroenterology;  Laterality: N/A;   POLYPECTOMY  03/14/2022   Procedure: POLYPECTOMY;  Surgeon:  Shellia Cleverly, DO;  Location: [facility];  Service: Gastroenterology;;    Social History:  reports that he has been smoking cigarettes. He started smoking about 15 years ago. He has a 7.9 pack-year smoking history. He has never used smokeless tobacco. He reports current alcohol use of about 42.0 standard drinks of alcohol per week. He reports current drug use. Frequency: 7.00 times per week. Drug: Marijuana.  Allergies:  Allergies  Allergen Reactions   Ibuprofen Anaphylaxis    Medications Prior to Admission  Medication Sig Dispense Refill   acetaminophen (TYLENOL) 325 MG tablet Take 2 tablets (650 mg total) by mouth every 6 (six) hours as needed for mild pain or moderate pain. 90 tablet 0   albuterol (PROVENTIL) (2.5 MG/3ML) 0.083% nebulizer solution Take 3 mLs (2.5 mg total) by nebulization every 6 (six) hours as needed for wheezing or shortness of breath. 75 mL 0   amLODipine (NORVASC) 10 MG tablet Take 1 tablet (10 mg total) by mouth at bedtime. 60 tablet 0   divalproex (DEPAKOTE) 500 MG DR tablet Take 1 tablet (500 mg total) by mouth 2 (two) times daily. 180 tablet 1   ferrous sulfate 324 (65 Fe) MG TBEC Take 1 tablet (325 mg total) by mouth daily at 12 noon. 90 tablet 0   fluconazole (DIFLUCAN) 200 MG tablet Take 0.5 tablets (100 mg total) by mouth  Comment: Performed at [facility], [address] [place], [place] [address]  I-STAT 7, (LYTES, BLD GAS, ICA, H+H)     Status: Abnormal   Collection Time: 05/27/23  7:27 PM  Result Value Ref Range   pH, Arterial 7.399 7.35 - 7.45   pCO2 arterial 55.7 (H) 32 - 48 mmHg   pO2, Arterial 103 83 - 108 mmHg   Bicarbonate 34.8 (H) 20.0 - 28.0 mmol/L   TCO2 37 (H) 22 - 32 mmol/L   O2 Saturation 98 %   Acid-Base Excess 8.0 (H) 0.0 - 2.0 mmol/L   Sodium 134 (L) 135 - 145 mmol/L   Potassium 3.9 3.5 - 5.1 mmol/L   Calcium, Ion 0.94 (L) 1.15 - 1.40 mmol/L   HCT 29.0 (L) 39.0 - 52.0 %   Hemoglobin 9.9 (L) 13.0 - 17.0 g/dL   Patient temperature 60.4 C    Sample type ARTERIAL   Glucose, capillary     Status: Abnormal   Collection Time: 05/27/23  7:28 PM  Result Value Ref Range   Glucose-Capillary 130 (H) 70 - 99 mg/dL    Comment: Glucose reference range applies only to samples taken after fasting for at least 8 hours.   DG Chest Port 1 View  Result Date: 05/27/2023 CLINICAL DATA:  540981 Pneumothorax 191478  EXAM: PORTABLE CHEST 1 VIEW COMPARISON:  May 27, 2023 FINDINGS: The cardiomediastinal silhouette is unchanged in contour.ETT tip terminates 9 mm above the carina. Enteric tube tip and side port project over the stomach. RIGHT IJ CVC tip terminates over the SVC. Small RIGHT pleural effusion and trace LEFT pleural effusion. No significant pneumothorax. Patchy heterogeneous basilar predominant opacities, not significantly changed in comparison to prior. IMPRESSION: 1. Support apparatus as described above. ETT tip terminates 9 mm above the carina. 2. Small RIGHT and trace LEFT pleural effusions. 3. Similar appearance of patchy heterogeneous basilar predominant opacities. This may reflect neurologic pulmonary edema with atelectasis but the differential considerations include aspiration or infection. Electronically Signed   By: Meda Klinefelter M.D.   On: 05/27/2023 18:00   DG CHEST PORT 1 VIEW  Result Date: 05/27/2023 CLINICAL DATA:  Endotracheal tube and OG tube. EXAM: PORTABLE CHEST 1 VIEW COMPARISON:  Chest x-ray 05/25/2023 FINDINGS: Endotracheal tube tip is 1.5 cm above the carina. Orogastric tube tip is in the gastric body/fundus. Right-sided central venous catheter tip projects over the SVC. The cardiomediastinal silhouette is stable and within normal limits. There are new patchy airspace opacities in both lower lungs, right greater than left. There is a new small right pleural effusion. There is no pneumothorax or acute fracture. IMPRESSION: 1. New patchy airspace opacities in both lower lungs, right greater than left, concerning for pneumonia. 2. New small right pleural effusion. 3. Endotracheal tube tip 1.5 cm above the carina. Electronically Signed   By: Darliss Cheney M.D.   On: 05/27/2023 15:27   ECHOCARDIOGRAM COMPLETE  Result Date: 05/26/2023    ECHOCARDIOGRAM REPORT   Patient Name:   Timothy Hubbard Date of Exam: 05/26/2023 Medical Rec #:  [id number]     Height:       66.0 in Accession #:     [id number]    Weight:       129.2 lb Date of Birth:  1990-09-16     BSA:          1.661 m Patient Age:    32 years      BP:  Comment: Performed at [facility], [address] [place], [place] [address]  I-STAT 7, (LYTES, BLD GAS, ICA, H+H)     Status: Abnormal   Collection Time: 05/27/23  7:27 PM  Result Value Ref Range   pH, Arterial 7.399 7.35 - 7.45   pCO2 arterial 55.7 (H) 32 - 48 mmHg   pO2, Arterial 103 83 - 108 mmHg   Bicarbonate 34.8 (H) 20.0 - 28.0 mmol/L   TCO2 37 (H) 22 - 32 mmol/L   O2 Saturation 98 %   Acid-Base Excess 8.0 (H) 0.0 - 2.0 mmol/L   Sodium 134 (L) 135 - 145 mmol/L   Potassium 3.9 3.5 - 5.1 mmol/L   Calcium, Ion 0.94 (L) 1.15 - 1.40 mmol/L   HCT 29.0 (L) 39.0 - 52.0 %   Hemoglobin 9.9 (L) 13.0 - 17.0 g/dL   Patient temperature 60.4 C    Sample type ARTERIAL   Glucose, capillary     Status: Abnormal   Collection Time: 05/27/23  7:28 PM  Result Value Ref Range   Glucose-Capillary 130 (H) 70 - 99 mg/dL    Comment: Glucose reference range applies only to samples taken after fasting for at least 8 hours.   DG Chest Port 1 View  Result Date: 05/27/2023 CLINICAL DATA:  [id number] Pneumothorax 191478  EXAM: PORTABLE CHEST 1 VIEW COMPARISON:  May 27, 2023 FINDINGS: The cardiomediastinal silhouette is unchanged in contour.ETT tip terminates 9 mm above the carina. Enteric tube tip and side port project over the stomach. RIGHT IJ CVC tip terminates over the SVC. Small RIGHT pleural effusion and trace LEFT pleural effusion. No significant pneumothorax. Patchy heterogeneous basilar predominant opacities, not significantly changed in comparison to prior. IMPRESSION: 1. Support apparatus as described above. ETT tip terminates 9 mm above the carina. 2. Small RIGHT and trace LEFT pleural effusions. 3. Similar appearance of patchy heterogeneous basilar predominant opacities. This may reflect neurologic pulmonary edema with atelectasis but the differential considerations include aspiration or infection. Electronically Signed   By: Meda Klinefelter M.D.   On: 05/27/2023 18:00   DG CHEST PORT 1 VIEW  Result Date: 05/27/2023 CLINICAL DATA:  Endotracheal tube and OG tube. EXAM: PORTABLE CHEST 1 VIEW COMPARISON:  Chest x-ray 05/25/2023 FINDINGS: Endotracheal tube tip is 1.5 cm above the carina. Orogastric tube tip is in the gastric body/fundus. Right-sided central venous catheter tip projects over the SVC. The cardiomediastinal silhouette is stable and within normal limits. There are new patchy airspace opacities in both lower lungs, right greater than left. There is a new small right pleural effusion. There is no pneumothorax or acute fracture. IMPRESSION: 1. New patchy airspace opacities in both lower lungs, right greater than left, concerning for pneumonia. 2. New small right pleural effusion. 3. Endotracheal tube tip 1.5 cm above the carina. Electronically Signed   By: Darliss Cheney M.D.   On: 05/27/2023 15:27   ECHOCARDIOGRAM COMPLETE  Result Date: 05/26/2023    ECHOCARDIOGRAM REPORT   Patient Name:   Timothy Hubbard Date of Exam: 05/26/2023 Medical Rec #:  [id number]     Height:       66.0 in Accession #:     [id number]    Weight:       129.2 lb Date of Birth:  1990-09-16     BSA:          1.661 m Patient Age:    32 years      BP:  Comment: Performed at [facility], [address] [place], [place] [address]  I-STAT 7, (LYTES, BLD GAS, ICA, H+H)     Status: Abnormal   Collection Time: 05/27/23  7:27 PM  Result Value Ref Range   pH, Arterial 7.399 7.35 - 7.45   pCO2 arterial 55.7 (H) 32 - 48 mmHg   pO2, Arterial 103 83 - 108 mmHg   Bicarbonate 34.8 (H) 20.0 - 28.0 mmol/L   TCO2 37 (H) 22 - 32 mmol/L   O2 Saturation 98 %   Acid-Base Excess 8.0 (H) 0.0 - 2.0 mmol/L   Sodium 134 (L) 135 - 145 mmol/L   Potassium 3.9 3.5 - 5.1 mmol/L   Calcium, Ion 0.94 (L) 1.15 - 1.40 mmol/L   HCT 29.0 (L) 39.0 - 52.0 %   Hemoglobin 9.9 (L) 13.0 - 17.0 g/dL   Patient temperature 60.4 C    Sample type ARTERIAL   Glucose, capillary     Status: Abnormal   Collection Time: 05/27/23  7:28 PM  Result Value Ref Range   Glucose-Capillary 130 (H) 70 - 99 mg/dL    Comment: Glucose reference range applies only to samples taken after fasting for at least 8 hours.   DG Chest Port 1 View  Result Date: 05/27/2023 CLINICAL DATA:  [id number] Pneumothorax 191478  EXAM: PORTABLE CHEST 1 VIEW COMPARISON:  May 27, 2023 FINDINGS: The cardiomediastinal silhouette is unchanged in contour.ETT tip terminates 9 mm above the carina. Enteric tube tip and side port project over the stomach. RIGHT IJ CVC tip terminates over the SVC. Small RIGHT pleural effusion and trace LEFT pleural effusion. No significant pneumothorax. Patchy heterogeneous basilar predominant opacities, not significantly changed in comparison to prior. IMPRESSION: 1. Support apparatus as described above. ETT tip terminates 9 mm above the carina. 2. Small RIGHT and trace LEFT pleural effusions. 3. Similar appearance of patchy heterogeneous basilar predominant opacities. This may reflect neurologic pulmonary edema with atelectasis but the differential considerations include aspiration or infection. Electronically Signed   By: Meda Klinefelter M.D.   On: 05/27/2023 18:00   DG CHEST PORT 1 VIEW  Result Date: 05/27/2023 CLINICAL DATA:  Endotracheal tube and OG tube. EXAM: PORTABLE CHEST 1 VIEW COMPARISON:  Chest x-ray 05/25/2023 FINDINGS: Endotracheal tube tip is 1.5 cm above the carina. Orogastric tube tip is in the gastric body/fundus. Right-sided central venous catheter tip projects over the SVC. The cardiomediastinal silhouette is stable and within normal limits. There are new patchy airspace opacities in both lower lungs, right greater than left. There is a new small right pleural effusion. There is no pneumothorax or acute fracture. IMPRESSION: 1. New patchy airspace opacities in both lower lungs, right greater than left, concerning for pneumonia. 2. New small right pleural effusion. 3. Endotracheal tube tip 1.5 cm above the carina. Electronically Signed   By: Darliss Cheney M.D.   On: 05/27/2023 15:27   ECHOCARDIOGRAM COMPLETE  Result Date: 05/26/2023    ECHOCARDIOGRAM REPORT   Patient Name:   Timothy Hubbard Date of Exam: 05/26/2023 Medical Rec #:  [id number]     Height:       66.0 in Accession #:     [id number]    Weight:       129.2 lb Date of Birth:  1990-09-16     BSA:          1.661 m Patient Age:    32 years      BP:  Comment: Performed at [facility], [address] [place], [place] [address]  I-STAT 7, (LYTES, BLD GAS, ICA, H+H)     Status: Abnormal   Collection Time: 05/27/23  7:27 PM  Result Value Ref Range   pH, Arterial 7.399 7.35 - 7.45   pCO2 arterial 55.7 (H) 32 - 48 mmHg   pO2, Arterial 103 83 - 108 mmHg   Bicarbonate 34.8 (H) 20.0 - 28.0 mmol/L   TCO2 37 (H) 22 - 32 mmol/L   O2 Saturation 98 %   Acid-Base Excess 8.0 (H) 0.0 - 2.0 mmol/L   Sodium 134 (L) 135 - 145 mmol/L   Potassium 3.9 3.5 - 5.1 mmol/L   Calcium, Ion 0.94 (L) 1.15 - 1.40 mmol/L   HCT 29.0 (L) 39.0 - 52.0 %   Hemoglobin 9.9 (L) 13.0 - 17.0 g/dL   Patient temperature 60.4 C    Sample type ARTERIAL   Glucose, capillary     Status: Abnormal   Collection Time: 05/27/23  7:28 PM  Result Value Ref Range   Glucose-Capillary 130 (H) 70 - 99 mg/dL    Comment: Glucose reference range applies only to samples taken after fasting for at least 8 hours.   DG Chest Port 1 View  Result Date: 05/27/2023 CLINICAL DATA:  [id number] Pneumothorax 191478  EXAM: PORTABLE CHEST 1 VIEW COMPARISON:  May 27, 2023 FINDINGS: The cardiomediastinal silhouette is unchanged in contour.ETT tip terminates 9 mm above the carina. Enteric tube tip and side port project over the stomach. RIGHT IJ CVC tip terminates over the SVC. Small RIGHT pleural effusion and trace LEFT pleural effusion. No significant pneumothorax. Patchy heterogeneous basilar predominant opacities, not significantly changed in comparison to prior. IMPRESSION: 1. Support apparatus as described above. ETT tip terminates 9 mm above the carina. 2. Small RIGHT and trace LEFT pleural effusions. 3. Similar appearance of patchy heterogeneous basilar predominant opacities. This may reflect neurologic pulmonary edema with atelectasis but the differential considerations include aspiration or infection. Electronically Signed   By: Meda Klinefelter M.D.   On: 05/27/2023 18:00   DG CHEST PORT 1 VIEW  Result Date: 05/27/2023 CLINICAL DATA:  Endotracheal tube and OG tube. EXAM: PORTABLE CHEST 1 VIEW COMPARISON:  Chest x-ray 05/25/2023 FINDINGS: Endotracheal tube tip is 1.5 cm above the carina. Orogastric tube tip is in the gastric body/fundus. Right-sided central venous catheter tip projects over the SVC. The cardiomediastinal silhouette is stable and within normal limits. There are new patchy airspace opacities in both lower lungs, right greater than left. There is a new small right pleural effusion. There is no pneumothorax or acute fracture. IMPRESSION: 1. New patchy airspace opacities in both lower lungs, right greater than left, concerning for pneumonia. 2. New small right pleural effusion. 3. Endotracheal tube tip 1.5 cm above the carina. Electronically Signed   By: Darliss Cheney M.D.   On: 05/27/2023 15:27   ECHOCARDIOGRAM COMPLETE  Result Date: 05/26/2023    ECHOCARDIOGRAM REPORT   Patient Name:   Timothy Hubbard Date of Exam: 05/26/2023 Medical Rec #:  [id number]     Height:       66.0 in Accession #:     [id number]    Weight:       129.2 lb Date of Birth:  1990-09-16     BSA:          1.661 m Patient Age:    32 years      BP:  Timothy Hubbard Jul 18, 1991  [id number].    Requesting MD: Katrinka Blazing Chief Complaint/Reason for Consult: C/f hemoperitoneum  HPI:  32 y/o M with a hx of seizures who initially presented 9/19 after he had a witnessed seizure at home.  While on route to the hospital he became bradycardiac and experienced an arrest. ROSC was achieved following one round of CPR.  Upon arrival he underwent a CT AP that did not show any acute findings.  Labs at that time were notable for a Hb of 12. On 9/20 his Hb was <6 and he was requiring pressors despite no obvious course of bleeding.  He continued to require transfusions throughout the day, and on 9/21 experienced another code event that required 2 rounds of CPR. A bedside ultrasound was performed and showed a large volume of intra-peritoneal fluid.  The fluid was aspirated and appeared to be frank blood.  Surgery was consulted to evaluate.  At the time of my exam, patient is intubated and sedated.  He is on 2 pressors and has receiving 3 pRBCs today as well as FFP and cryo.  There is minimal bloody output from his OG but no melena per nursing.  His abdomen is distended but relatively soft.    ROS: Not obtained due to patient factors  Family History  Problem Relation Age of Onset   Hypertension Mother    Cancer Maternal Grandfather        doesn't know the type    Past Medical History:  Diagnosis Date   Acute encephalopathy 03/10/2022   Adenomatous polyp of descending colon    Asthma    Encephalopathy acute 03/01/2022   Hematochezia    HTN (hypertension)    Hypokalemia 03/04/2022   Need for hepatitis C screening test 04/27/2022   Seizures (HCC)     Past Surgical History:  Procedure Laterality Date   COLONOSCOPY WITH PROPOFOL N/A 03/14/2022   Procedure: COLONOSCOPY WITH PROPOFOL;  Surgeon: Shellia Cleverly, DO;  Location: [facility];  Service: Gastroenterology;  Laterality: N/A;   POLYPECTOMY  03/14/2022   Procedure: POLYPECTOMY;  Surgeon:  Shellia Cleverly, DO;  Location: [facility];  Service: Gastroenterology;;    Social History:  reports that he has been smoking cigarettes. He started smoking about 15 years ago. He has a 7.9 pack-year smoking history. He has never used smokeless tobacco. He reports current alcohol use of about 42.0 standard drinks of alcohol per week. He reports current drug use. Frequency: 7.00 times per week. Drug: Marijuana.  Allergies:  Allergies  Allergen Reactions   Ibuprofen Anaphylaxis    Medications Prior to Admission  Medication Sig Dispense Refill   acetaminophen (TYLENOL) 325 MG tablet Take 2 tablets (650 mg total) by mouth every 6 (six) hours as needed for mild pain or moderate pain. 90 tablet 0   albuterol (PROVENTIL) (2.5 MG/3ML) 0.083% nebulizer solution Take 3 mLs (2.5 mg total) by nebulization every 6 (six) hours as needed for wheezing or shortness of breath. 75 mL 0   amLODipine (NORVASC) 10 MG tablet Take 1 tablet (10 mg total) by mouth at bedtime. 60 tablet 0   divalproex (DEPAKOTE) 500 MG DR tablet Take 1 tablet (500 mg total) by mouth 2 (two) times daily. 180 tablet 1   ferrous sulfate 324 (65 Fe) MG TBEC Take 1 tablet (325 mg total) by mouth daily at 12 noon. 90 tablet 0   fluconazole (DIFLUCAN) 200 MG tablet Take 0.5 tablets (100 mg total) by mouth  Comment: Performed at [facility], [address] [place], [place] [address]  I-STAT 7, (LYTES, BLD GAS, ICA, H+H)     Status: Abnormal   Collection Time: 05/27/23  7:27 PM  Result Value Ref Range   pH, Arterial 7.399 7.35 - 7.45   pCO2 arterial 55.7 (H) 32 - 48 mmHg   pO2, Arterial 103 83 - 108 mmHg   Bicarbonate 34.8 (H) 20.0 - 28.0 mmol/L   TCO2 37 (H) 22 - 32 mmol/L   O2 Saturation 98 %   Acid-Base Excess 8.0 (H) 0.0 - 2.0 mmol/L   Sodium 134 (L) 135 - 145 mmol/L   Potassium 3.9 3.5 - 5.1 mmol/L   Calcium, Ion 0.94 (L) 1.15 - 1.40 mmol/L   HCT 29.0 (L) 39.0 - 52.0 %   Hemoglobin 9.9 (L) 13.0 - 17.0 g/dL   Patient temperature 60.4 C    Sample type ARTERIAL   Glucose, capillary     Status: Abnormal   Collection Time: 05/27/23  7:28 PM  Result Value Ref Range   Glucose-Capillary 130 (H) 70 - 99 mg/dL    Comment: Glucose reference range applies only to samples taken after fasting for at least 8 hours.   DG Chest Port 1 View  Result Date: 05/27/2023 CLINICAL DATA:  540981 Pneumothorax 191478  EXAM: PORTABLE CHEST 1 VIEW COMPARISON:  May 27, 2023 FINDINGS: The cardiomediastinal silhouette is unchanged in contour.ETT tip terminates 9 mm above the carina. Enteric tube tip and side port project over the stomach. RIGHT IJ CVC tip terminates over the SVC. Small RIGHT pleural effusion and trace LEFT pleural effusion. No significant pneumothorax. Patchy heterogeneous basilar predominant opacities, not significantly changed in comparison to prior. IMPRESSION: 1. Support apparatus as described above. ETT tip terminates 9 mm above the carina. 2. Small RIGHT and trace LEFT pleural effusions. 3. Similar appearance of patchy heterogeneous basilar predominant opacities. This may reflect neurologic pulmonary edema with atelectasis but the differential considerations include aspiration or infection. Electronically Signed   By: Meda Klinefelter M.D.   On: 05/27/2023 18:00   DG CHEST PORT 1 VIEW  Result Date: 05/27/2023 CLINICAL DATA:  Endotracheal tube and OG tube. EXAM: PORTABLE CHEST 1 VIEW COMPARISON:  Chest x-ray 05/25/2023 FINDINGS: Endotracheal tube tip is 1.5 cm above the carina. Orogastric tube tip is in the gastric body/fundus. Right-sided central venous catheter tip projects over the SVC. The cardiomediastinal silhouette is stable and within normal limits. There are new patchy airspace opacities in both lower lungs, right greater than left. There is a new small right pleural effusion. There is no pneumothorax or acute fracture. IMPRESSION: 1. New patchy airspace opacities in both lower lungs, right greater than left, concerning for pneumonia. 2. New small right pleural effusion. 3. Endotracheal tube tip 1.5 cm above the carina. Electronically Signed   By: Darliss Cheney M.D.   On: 05/27/2023 15:27   ECHOCARDIOGRAM COMPLETE  Result Date: 05/26/2023    ECHOCARDIOGRAM REPORT   Patient Name:   Timothy Hubbard Date of Exam: 05/26/2023 Medical Rec #:  [id number]     Height:       66.0 in Accession #:     [id number]    Weight:       129.2 lb Date of Birth:  1990-09-16     BSA:          1.661 m Patient Age:    32 years      BP:  Timothy Hubbard Jul 18, 1991  [id number].    Requesting MD: Katrinka Blazing Chief Complaint/Reason for Consult: C/f hemoperitoneum  HPI:  32 y/o M with a hx of seizures who initially presented 9/19 after he had a witnessed seizure at home.  While on route to the hospital he became bradycardiac and experienced an arrest. ROSC was achieved following one round of CPR.  Upon arrival he underwent a CT AP that did not show any acute findings.  Labs at that time were notable for a Hb of 12. On 9/20 his Hb was <6 and he was requiring pressors despite no obvious course of bleeding.  He continued to require transfusions throughout the day, and on 9/21 experienced another code event that required 2 rounds of CPR. A bedside ultrasound was performed and showed a large volume of intra-peritoneal fluid.  The fluid was aspirated and appeared to be frank blood.  Surgery was consulted to evaluate.  At the time of my exam, patient is intubated and sedated.  He is on 2 pressors and has receiving 3 pRBCs today as well as FFP and cryo.  There is minimal bloody output from his OG but no melena per nursing.  His abdomen is distended but relatively soft.    ROS: Not obtained due to patient factors  Family History  Problem Relation Age of Onset   Hypertension Mother    Cancer Maternal Grandfather        doesn't know the type    Past Medical History:  Diagnosis Date   Acute encephalopathy 03/10/2022   Adenomatous polyp of descending colon    Asthma    Encephalopathy acute 03/01/2022   Hematochezia    HTN (hypertension)    Hypokalemia 03/04/2022   Need for hepatitis C screening test 04/27/2022   Seizures (HCC)     Past Surgical History:  Procedure Laterality Date   COLONOSCOPY WITH PROPOFOL N/A 03/14/2022   Procedure: COLONOSCOPY WITH PROPOFOL;  Surgeon: Shellia Cleverly, DO;  Location: [facility];  Service: Gastroenterology;  Laterality: N/A;   POLYPECTOMY  03/14/2022   Procedure: POLYPECTOMY;  Surgeon:  Shellia Cleverly, DO;  Location: [facility];  Service: Gastroenterology;;    Social History:  reports that he has been smoking cigarettes. He started smoking about 15 years ago. He has a 7.9 pack-year smoking history. He has never used smokeless tobacco. He reports current alcohol use of about 42.0 standard drinks of alcohol per week. He reports current drug use. Frequency: 7.00 times per week. Drug: Marijuana.  Allergies:  Allergies  Allergen Reactions   Ibuprofen Anaphylaxis    Medications Prior to Admission  Medication Sig Dispense Refill   acetaminophen (TYLENOL) 325 MG tablet Take 2 tablets (650 mg total) by mouth every 6 (six) hours as needed for mild pain or moderate pain. 90 tablet 0   albuterol (PROVENTIL) (2.5 MG/3ML) 0.083% nebulizer solution Take 3 mLs (2.5 mg total) by nebulization every 6 (six) hours as needed for wheezing or shortness of breath. 75 mL 0   amLODipine (NORVASC) 10 MG tablet Take 1 tablet (10 mg total) by mouth at bedtime. 60 tablet 0   divalproex (DEPAKOTE) 500 MG DR tablet Take 1 tablet (500 mg total) by mouth 2 (two) times daily. 180 tablet 1   ferrous sulfate 324 (65 Fe) MG TBEC Take 1 tablet (325 mg total) by mouth daily at 12 noon. 90 tablet 0   fluconazole (DIFLUCAN) 200 MG tablet Take 0.5 tablets (100 mg total) by mouth  Comment: Performed at [facility], [address] [place], [place] [address]  I-STAT 7, (LYTES, BLD GAS, ICA, H+H)     Status: Abnormal   Collection Time: 05/27/23  7:27 PM  Result Value Ref Range   pH, Arterial 7.399 7.35 - 7.45   pCO2 arterial 55.7 (H) 32 - 48 mmHg   pO2, Arterial 103 83 - 108 mmHg   Bicarbonate 34.8 (H) 20.0 - 28.0 mmol/L   TCO2 37 (H) 22 - 32 mmol/L   O2 Saturation 98 %   Acid-Base Excess 8.0 (H) 0.0 - 2.0 mmol/L   Sodium 134 (L) 135 - 145 mmol/L   Potassium 3.9 3.5 - 5.1 mmol/L   Calcium, Ion 0.94 (L) 1.15 - 1.40 mmol/L   HCT 29.0 (L) 39.0 - 52.0 %   Hemoglobin 9.9 (L) 13.0 - 17.0 g/dL   Patient temperature 60.4 C    Sample type ARTERIAL   Glucose, capillary     Status: Abnormal   Collection Time: 05/27/23  7:28 PM  Result Value Ref Range   Glucose-Capillary 130 (H) 70 - 99 mg/dL    Comment: Glucose reference range applies only to samples taken after fasting for at least 8 hours.   DG Chest Port 1 View  Result Date: 05/27/2023 CLINICAL DATA:  [id number] Pneumothorax 191478  EXAM: PORTABLE CHEST 1 VIEW COMPARISON:  May 27, 2023 FINDINGS: The cardiomediastinal silhouette is unchanged in contour.ETT tip terminates 9 mm above the carina. Enteric tube tip and side port project over the stomach. RIGHT IJ CVC tip terminates over the SVC. Small RIGHT pleural effusion and trace LEFT pleural effusion. No significant pneumothorax. Patchy heterogeneous basilar predominant opacities, not significantly changed in comparison to prior. IMPRESSION: 1. Support apparatus as described above. ETT tip terminates 9 mm above the carina. 2. Small RIGHT and trace LEFT pleural effusions. 3. Similar appearance of patchy heterogeneous basilar predominant opacities. This may reflect neurologic pulmonary edema with atelectasis but the differential considerations include aspiration or infection. Electronically Signed   By: Meda Klinefelter M.D.   On: 05/27/2023 18:00   DG CHEST PORT 1 VIEW  Result Date: 05/27/2023 CLINICAL DATA:  Endotracheal tube and OG tube. EXAM: PORTABLE CHEST 1 VIEW COMPARISON:  Chest x-ray 05/25/2023 FINDINGS: Endotracheal tube tip is 1.5 cm above the carina. Orogastric tube tip is in the gastric body/fundus. Right-sided central venous catheter tip projects over the SVC. The cardiomediastinal silhouette is stable and within normal limits. There are new patchy airspace opacities in both lower lungs, right greater than left. There is a new small right pleural effusion. There is no pneumothorax or acute fracture. IMPRESSION: 1. New patchy airspace opacities in both lower lungs, right greater than left, concerning for pneumonia. 2. New small right pleural effusion. 3. Endotracheal tube tip 1.5 cm above the carina. Electronically Signed   By: Darliss Cheney M.D.   On: 05/27/2023 15:27   ECHOCARDIOGRAM COMPLETE  Result Date: 05/26/2023    ECHOCARDIOGRAM REPORT   Patient Name:   Timothy Hubbard Date of Exam: 05/26/2023 Medical Rec #:  [id number]     Height:       66.0 in Accession #:     [id number]    Weight:       129.2 lb Date of Birth:  1990-09-16     BSA:          1.661 m Patient Age:    32 years      BP:  Comment: Performed at [facility], [address] [place], [place] [address]  I-STAT 7, (LYTES, BLD GAS, ICA, H+H)     Status: Abnormal   Collection Time: 05/27/23  7:27 PM  Result Value Ref Range   pH, Arterial 7.399 7.35 - 7.45   pCO2 arterial 55.7 (H) 32 - 48 mmHg   pO2, Arterial 103 83 - 108 mmHg   Bicarbonate 34.8 (H) 20.0 - 28.0 mmol/L   TCO2 37 (H) 22 - 32 mmol/L   O2 Saturation 98 %   Acid-Base Excess 8.0 (H) 0.0 - 2.0 mmol/L   Sodium 134 (L) 135 - 145 mmol/L   Potassium 3.9 3.5 - 5.1 mmol/L   Calcium, Ion 0.94 (L) 1.15 - 1.40 mmol/L   HCT 29.0 (L) 39.0 - 52.0 %   Hemoglobin 9.9 (L) 13.0 - 17.0 g/dL   Patient temperature 60.4 C    Sample type ARTERIAL   Glucose, capillary     Status: Abnormal   Collection Time: 05/27/23  7:28 PM  Result Value Ref Range   Glucose-Capillary 130 (H) 70 - 99 mg/dL    Comment: Glucose reference range applies only to samples taken after fasting for at least 8 hours.   DG Chest Port 1 View  Result Date: 05/27/2023 CLINICAL DATA:  [id number] Pneumothorax 191478  EXAM: PORTABLE CHEST 1 VIEW COMPARISON:  May 27, 2023 FINDINGS: The cardiomediastinal silhouette is unchanged in contour.ETT tip terminates 9 mm above the carina. Enteric tube tip and side port project over the stomach. RIGHT IJ CVC tip terminates over the SVC. Small RIGHT pleural effusion and trace LEFT pleural effusion. No significant pneumothorax. Patchy heterogeneous basilar predominant opacities, not significantly changed in comparison to prior. IMPRESSION: 1. Support apparatus as described above. ETT tip terminates 9 mm above the carina. 2. Small RIGHT and trace LEFT pleural effusions. 3. Similar appearance of patchy heterogeneous basilar predominant opacities. This may reflect neurologic pulmonary edema with atelectasis but the differential considerations include aspiration or infection. Electronically Signed   By: Meda Klinefelter M.D.   On: 05/27/2023 18:00   DG CHEST PORT 1 VIEW  Result Date: 05/27/2023 CLINICAL DATA:  Endotracheal tube and OG tube. EXAM: PORTABLE CHEST 1 VIEW COMPARISON:  Chest x-ray 05/25/2023 FINDINGS: Endotracheal tube tip is 1.5 cm above the carina. Orogastric tube tip is in the gastric body/fundus. Right-sided central venous catheter tip projects over the SVC. The cardiomediastinal silhouette is stable and within normal limits. There are new patchy airspace opacities in both lower lungs, right greater than left. There is a new small right pleural effusion. There is no pneumothorax or acute fracture. IMPRESSION: 1. New patchy airspace opacities in both lower lungs, right greater than left, concerning for pneumonia. 2. New small right pleural effusion. 3. Endotracheal tube tip 1.5 cm above the carina. Electronically Signed   By: Darliss Cheney M.D.   On: 05/27/2023 15:27   ECHOCARDIOGRAM COMPLETE  Result Date: 05/26/2023    ECHOCARDIOGRAM REPORT   Patient Name:   Timothy Hubbard Date of Exam: 05/26/2023 Medical Rec #:  [id number]     Height:       66.0 in Accession #:     [id number]    Weight:       129.2 lb Date of Birth:  1990-09-16     BSA:          1.661 m Patient Age:    32 years      BP:  Timothy Hubbard Jul 18, 1991  [id number].    Requesting MD: Katrinka Blazing Chief Complaint/Reason for Consult: C/f hemoperitoneum  HPI:  32 y/o M with a hx of seizures who initially presented 9/19 after he had a witnessed seizure at home.  While on route to the hospital he became bradycardiac and experienced an arrest. ROSC was achieved following one round of CPR.  Upon arrival he underwent a CT AP that did not show any acute findings.  Labs at that time were notable for a Hb of 12. On 9/20 his Hb was <6 and he was requiring pressors despite no obvious course of bleeding.  He continued to require transfusions throughout the day, and on 9/21 experienced another code event that required 2 rounds of CPR. A bedside ultrasound was performed and showed a large volume of intra-peritoneal fluid.  The fluid was aspirated and appeared to be frank blood.  Surgery was consulted to evaluate.  At the time of my exam, patient is intubated and sedated.  He is on 2 pressors and has receiving 3 pRBCs today as well as FFP and cryo.  There is minimal bloody output from his OG but no melena per nursing.  His abdomen is distended but relatively soft.    ROS: Not obtained due to patient factors  Family History  Problem Relation Age of Onset   Hypertension Mother    Cancer Maternal Grandfather        doesn't know the type    Past Medical History:  Diagnosis Date   Acute encephalopathy 03/10/2022   Adenomatous polyp of descending colon    Asthma    Encephalopathy acute 03/01/2022   Hematochezia    HTN (hypertension)    Hypokalemia 03/04/2022   Need for hepatitis C screening test 04/27/2022   Seizures (HCC)     Past Surgical History:  Procedure Laterality Date   COLONOSCOPY WITH PROPOFOL N/A 03/14/2022   Procedure: COLONOSCOPY WITH PROPOFOL;  Surgeon: Shellia Cleverly, DO;  Location: [facility];  Service: Gastroenterology;  Laterality: N/A;   POLYPECTOMY  03/14/2022   Procedure: POLYPECTOMY;  Surgeon:  Shellia Cleverly, DO;  Location: [facility];  Service: Gastroenterology;;    Social History:  reports that he has been smoking cigarettes. He started smoking about 15 years ago. He has a 7.9 pack-year smoking history. He has never used smokeless tobacco. He reports current alcohol use of about 42.0 standard drinks of alcohol per week. He reports current drug use. Frequency: 7.00 times per week. Drug: Marijuana.  Allergies:  Allergies  Allergen Reactions   Ibuprofen Anaphylaxis    Medications Prior to Admission  Medication Sig Dispense Refill   acetaminophen (TYLENOL) 325 MG tablet Take 2 tablets (650 mg total) by mouth every 6 (six) hours as needed for mild pain or moderate pain. 90 tablet 0   albuterol (PROVENTIL) (2.5 MG/3ML) 0.083% nebulizer solution Take 3 mLs (2.5 mg total) by nebulization every 6 (six) hours as needed for wheezing or shortness of breath. 75 mL 0   amLODipine (NORVASC) 10 MG tablet Take 1 tablet (10 mg total) by mouth at bedtime. 60 tablet 0   divalproex (DEPAKOTE) 500 MG DR tablet Take 1 tablet (500 mg total) by mouth 2 (two) times daily. 180 tablet 1   ferrous sulfate 324 (65 Fe) MG TBEC Take 1 tablet (325 mg total) by mouth daily at 12 noon. 90 tablet 0   fluconazole (DIFLUCAN) 200 MG tablet Take 0.5 tablets (100 mg total) by mouth  Timothy Hubbard Jul 18, 1991  [id number].    Requesting MD: Katrinka Blazing Chief Complaint/Reason for Consult: C/f hemoperitoneum  HPI:  32 y/o M with a hx of seizures who initially presented 9/19 after he had a witnessed seizure at home.  While on route to the hospital he became bradycardiac and experienced an arrest. ROSC was achieved following one round of CPR.  Upon arrival he underwent a CT AP that did not show any acute findings.  Labs at that time were notable for a Hb of 12. On 9/20 his Hb was <6 and he was requiring pressors despite no obvious course of bleeding.  He continued to require transfusions throughout the day, and on 9/21 experienced another code event that required 2 rounds of CPR. A bedside ultrasound was performed and showed a large volume of intra-peritoneal fluid.  The fluid was aspirated and appeared to be frank blood.  Surgery was consulted to evaluate.  At the time of my exam, patient is intubated and sedated.  He is on 2 pressors and has receiving 3 pRBCs today as well as FFP and cryo.  There is minimal bloody output from his OG but no melena per nursing.  His abdomen is distended but relatively soft.    ROS: Not obtained due to patient factors  Family History  Problem Relation Age of Onset   Hypertension Mother    Cancer Maternal Grandfather        doesn't know the type    Past Medical History:  Diagnosis Date   Acute encephalopathy 03/10/2022   Adenomatous polyp of descending colon    Asthma    Encephalopathy acute 03/01/2022   Hematochezia    HTN (hypertension)    Hypokalemia 03/04/2022   Need for hepatitis C screening test 04/27/2022   Seizures (HCC)     Past Surgical History:  Procedure Laterality Date   COLONOSCOPY WITH PROPOFOL N/A 03/14/2022   Procedure: COLONOSCOPY WITH PROPOFOL;  Surgeon: Shellia Cleverly, DO;  Location: [facility];  Service: Gastroenterology;  Laterality: N/A;   POLYPECTOMY  03/14/2022   Procedure: POLYPECTOMY;  Surgeon:  Shellia Cleverly, DO;  Location: [facility];  Service: Gastroenterology;;    Social History:  reports that he has been smoking cigarettes. He started smoking about 15 years ago. He has a 7.9 pack-year smoking history. He has never used smokeless tobacco. He reports current alcohol use of about 42.0 standard drinks of alcohol per week. He reports current drug use. Frequency: 7.00 times per week. Drug: Marijuana.  Allergies:  Allergies  Allergen Reactions   Ibuprofen Anaphylaxis    Medications Prior to Admission  Medication Sig Dispense Refill   acetaminophen (TYLENOL) 325 MG tablet Take 2 tablets (650 mg total) by mouth every 6 (six) hours as needed for mild pain or moderate pain. 90 tablet 0   albuterol (PROVENTIL) (2.5 MG/3ML) 0.083% nebulizer solution Take 3 mLs (2.5 mg total) by nebulization every 6 (six) hours as needed for wheezing or shortness of breath. 75 mL 0   amLODipine (NORVASC) 10 MG tablet Take 1 tablet (10 mg total) by mouth at bedtime. 60 tablet 0   divalproex (DEPAKOTE) 500 MG DR tablet Take 1 tablet (500 mg total) by mouth 2 (two) times daily. 180 tablet 1   ferrous sulfate 324 (65 Fe) MG TBEC Take 1 tablet (325 mg total) by mouth daily at 12 noon. 90 tablet 0   fluconazole (DIFLUCAN) 200 MG tablet Take 0.5 tablets (100 mg total) by mouth  Timothy Hubbard Jul 18, 1991  [id number].    Requesting MD: Katrinka Blazing Chief Complaint/Reason for Consult: C/f hemoperitoneum  HPI:  32 y/o M with a hx of seizures who initially presented 9/19 after he had a witnessed seizure at home.  While on route to the hospital he became bradycardiac and experienced an arrest. ROSC was achieved following one round of CPR.  Upon arrival he underwent a CT AP that did not show any acute findings.  Labs at that time were notable for a Hb of 12. On 9/20 his Hb was <6 and he was requiring pressors despite no obvious course of bleeding.  He continued to require transfusions throughout the day, and on 9/21 experienced another code event that required 2 rounds of CPR. A bedside ultrasound was performed and showed a large volume of intra-peritoneal fluid.  The fluid was aspirated and appeared to be frank blood.  Surgery was consulted to evaluate.  At the time of my exam, patient is intubated and sedated.  He is on 2 pressors and has receiving 3 pRBCs today as well as FFP and cryo.  There is minimal bloody output from his OG but no melena per nursing.  His abdomen is distended but relatively soft.    ROS: Not obtained due to patient factors  Family History  Problem Relation Age of Onset   Hypertension Mother    Cancer Maternal Grandfather        doesn't know the type    Past Medical History:  Diagnosis Date   Acute encephalopathy 03/10/2022   Adenomatous polyp of descending colon    Asthma    Encephalopathy acute 03/01/2022   Hematochezia    HTN (hypertension)    Hypokalemia 03/04/2022   Need for hepatitis C screening test 04/27/2022   Seizures (HCC)     Past Surgical History:  Procedure Laterality Date   COLONOSCOPY WITH PROPOFOL N/A 03/14/2022   Procedure: COLONOSCOPY WITH PROPOFOL;  Surgeon: Shellia Cleverly, DO;  Location: [facility];  Service: Gastroenterology;  Laterality: N/A;   POLYPECTOMY  03/14/2022   Procedure: POLYPECTOMY;  Surgeon:  Shellia Cleverly, DO;  Location: [facility];  Service: Gastroenterology;;    Social History:  reports that he has been smoking cigarettes. He started smoking about 15 years ago. He has a 7.9 pack-year smoking history. He has never used smokeless tobacco. He reports current alcohol use of about 42.0 standard drinks of alcohol per week. He reports current drug use. Frequency: 7.00 times per week. Drug: Marijuana.  Allergies:  Allergies  Allergen Reactions   Ibuprofen Anaphylaxis    Medications Prior to Admission  Medication Sig Dispense Refill   acetaminophen (TYLENOL) 325 MG tablet Take 2 tablets (650 mg total) by mouth every 6 (six) hours as needed for mild pain or moderate pain. 90 tablet 0   albuterol (PROVENTIL) (2.5 MG/3ML) 0.083% nebulizer solution Take 3 mLs (2.5 mg total) by nebulization every 6 (six) hours as needed for wheezing or shortness of breath. 75 mL 0   amLODipine (NORVASC) 10 MG tablet Take 1 tablet (10 mg total) by mouth at bedtime. 60 tablet 0   divalproex (DEPAKOTE) 500 MG DR tablet Take 1 tablet (500 mg total) by mouth 2 (two) times daily. 180 tablet 1   ferrous sulfate 324 (65 Fe) MG TBEC Take 1 tablet (325 mg total) by mouth daily at 12 noon. 90 tablet 0   fluconazole (DIFLUCAN) 200 MG tablet Take 0.5 tablets (100 mg total) by mouth  Timothy Hubbard Jul 18, 1991  [id number].    Requesting MD: Katrinka Blazing Chief Complaint/Reason for Consult: C/f hemoperitoneum  HPI:  32 y/o M with a hx of seizures who initially presented 9/19 after he had a witnessed seizure at home.  While on route to the hospital he became bradycardiac and experienced an arrest. ROSC was achieved following one round of CPR.  Upon arrival he underwent a CT AP that did not show any acute findings.  Labs at that time were notable for a Hb of 12. On 9/20 his Hb was <6 and he was requiring pressors despite no obvious course of bleeding.  He continued to require transfusions throughout the day, and on 9/21 experienced another code event that required 2 rounds of CPR. A bedside ultrasound was performed and showed a large volume of intra-peritoneal fluid.  The fluid was aspirated and appeared to be frank blood.  Surgery was consulted to evaluate.  At the time of my exam, patient is intubated and sedated.  He is on 2 pressors and has receiving 3 pRBCs today as well as FFP and cryo.  There is minimal bloody output from his OG but no melena per nursing.  His abdomen is distended but relatively soft.    ROS: Not obtained due to patient factors  Family History  Problem Relation Age of Onset   Hypertension Mother    Cancer Maternal Grandfather        doesn't know the type    Past Medical History:  Diagnosis Date   Acute encephalopathy 03/10/2022   Adenomatous polyp of descending colon    Asthma    Encephalopathy acute 03/01/2022   Hematochezia    HTN (hypertension)    Hypokalemia 03/04/2022   Need for hepatitis C screening test 04/27/2022   Seizures (HCC)     Past Surgical History:  Procedure Laterality Date   COLONOSCOPY WITH PROPOFOL N/A 03/14/2022   Procedure: COLONOSCOPY WITH PROPOFOL;  Surgeon: Shellia Cleverly, DO;  Location: [facility];  Service: Gastroenterology;  Laterality: N/A;   POLYPECTOMY  03/14/2022   Procedure: POLYPECTOMY;  Surgeon:  Shellia Cleverly, DO;  Location: [facility];  Service: Gastroenterology;;    Social History:  reports that he has been smoking cigarettes. He started smoking about 15 years ago. He has a 7.9 pack-year smoking history. He has never used smokeless tobacco. He reports current alcohol use of about 42.0 standard drinks of alcohol per week. He reports current drug use. Frequency: 7.00 times per week. Drug: Marijuana.  Allergies:  Allergies  Allergen Reactions   Ibuprofen Anaphylaxis    Medications Prior to Admission  Medication Sig Dispense Refill   acetaminophen (TYLENOL) 325 MG tablet Take 2 tablets (650 mg total) by mouth every 6 (six) hours as needed for mild pain or moderate pain. 90 tablet 0   albuterol (PROVENTIL) (2.5 MG/3ML) 0.083% nebulizer solution Take 3 mLs (2.5 mg total) by nebulization every 6 (six) hours as needed for wheezing or shortness of breath. 75 mL 0   amLODipine (NORVASC) 10 MG tablet Take 1 tablet (10 mg total) by mouth at bedtime. 60 tablet 0   divalproex (DEPAKOTE) 500 MG DR tablet Take 1 tablet (500 mg total) by mouth 2 (two) times daily. 180 tablet 1   ferrous sulfate 324 (65 Fe) MG TBEC Take 1 tablet (325 mg total) by mouth daily at 12 noon. 90 tablet 0   fluconazole (DIFLUCAN) 200 MG tablet Take 0.5 tablets (100 mg total) by mouth  Comment: Performed at [facility], [address] [place], [place] [address]  I-STAT 7, (LYTES, BLD GAS, ICA, H+H)     Status: Abnormal   Collection Time: 05/27/23  7:27 PM  Result Value Ref Range   pH, Arterial 7.399 7.35 - 7.45   pCO2 arterial 55.7 (H) 32 - 48 mmHg   pO2, Arterial 103 83 - 108 mmHg   Bicarbonate 34.8 (H) 20.0 - 28.0 mmol/L   TCO2 37 (H) 22 - 32 mmol/L   O2 Saturation 98 %   Acid-Base Excess 8.0 (H) 0.0 - 2.0 mmol/L   Sodium 134 (L) 135 - 145 mmol/L   Potassium 3.9 3.5 - 5.1 mmol/L   Calcium, Ion 0.94 (L) 1.15 - 1.40 mmol/L   HCT 29.0 (L) 39.0 - 52.0 %   Hemoglobin 9.9 (L) 13.0 - 17.0 g/dL   Patient temperature 60.4 C    Sample type ARTERIAL   Glucose, capillary     Status: Abnormal   Collection Time: 05/27/23  7:28 PM  Result Value Ref Range   Glucose-Capillary 130 (H) 70 - 99 mg/dL    Comment: Glucose reference range applies only to samples taken after fasting for at least 8 hours.   DG Chest Port 1 View  Result Date: 05/27/2023 CLINICAL DATA:  540981 Pneumothorax 191478  EXAM: PORTABLE CHEST 1 VIEW COMPARISON:  May 27, 2023 FINDINGS: The cardiomediastinal silhouette is unchanged in contour.ETT tip terminates 9 mm above the carina. Enteric tube tip and side port project over the stomach. RIGHT IJ CVC tip terminates over the SVC. Small RIGHT pleural effusion and trace LEFT pleural effusion. No significant pneumothorax. Patchy heterogeneous basilar predominant opacities, not significantly changed in comparison to prior. IMPRESSION: 1. Support apparatus as described above. ETT tip terminates 9 mm above the carina. 2. Small RIGHT and trace LEFT pleural effusions. 3. Similar appearance of patchy heterogeneous basilar predominant opacities. This may reflect neurologic pulmonary edema with atelectasis but the differential considerations include aspiration or infection. Electronically Signed   By: Meda Klinefelter M.D.   On: 05/27/2023 18:00   DG CHEST PORT 1 VIEW  Result Date: 05/27/2023 CLINICAL DATA:  Endotracheal tube and OG tube. EXAM: PORTABLE CHEST 1 VIEW COMPARISON:  Chest x-ray 05/25/2023 FINDINGS: Endotracheal tube tip is 1.5 cm above the carina. Orogastric tube tip is in the gastric body/fundus. Right-sided central venous catheter tip projects over the SVC. The cardiomediastinal silhouette is stable and within normal limits. There are new patchy airspace opacities in both lower lungs, right greater than left. There is a new small right pleural effusion. There is no pneumothorax or acute fracture. IMPRESSION: 1. New patchy airspace opacities in both lower lungs, right greater than left, concerning for pneumonia. 2. New small right pleural effusion. 3. Endotracheal tube tip 1.5 cm above the carina. Electronically Signed   By: Darliss Cheney M.D.   On: 05/27/2023 15:27   ECHOCARDIOGRAM COMPLETE  Result Date: 05/26/2023    ECHOCARDIOGRAM REPORT   Patient Name:   Timothy Hubbard Date of Exam: 05/26/2023 Medical Rec #:  [id number]     Height:       66.0 in Accession #:     [id number]    Weight:       129.2 lb Date of Birth:  1990-09-16     BSA:          1.661 m Patient Age:    32 years      BP:  Timothy Hubbard Jul 18, 1991  [id number].    Requesting MD: Katrinka Blazing Chief Complaint/Reason for Consult: C/f hemoperitoneum  HPI:  32 y/o M with a hx of seizures who initially presented 9/19 after he had a witnessed seizure at home.  While on route to the hospital he became bradycardiac and experienced an arrest. ROSC was achieved following one round of CPR.  Upon arrival he underwent a CT AP that did not show any acute findings.  Labs at that time were notable for a Hb of 12. On 9/20 his Hb was <6 and he was requiring pressors despite no obvious course of bleeding.  He continued to require transfusions throughout the day, and on 9/21 experienced another code event that required 2 rounds of CPR. A bedside ultrasound was performed and showed a large volume of intra-peritoneal fluid.  The fluid was aspirated and appeared to be frank blood.  Surgery was consulted to evaluate.  At the time of my exam, patient is intubated and sedated.  He is on 2 pressors and has receiving 3 pRBCs today as well as FFP and cryo.  There is minimal bloody output from his OG but no melena per nursing.  His abdomen is distended but relatively soft.    ROS: Not obtained due to patient factors  Family History  Problem Relation Age of Onset   Hypertension Mother    Cancer Maternal Grandfather        doesn't know the type    Past Medical History:  Diagnosis Date   Acute encephalopathy 03/10/2022   Adenomatous polyp of descending colon    Asthma    Encephalopathy acute 03/01/2022   Hematochezia    HTN (hypertension)    Hypokalemia 03/04/2022   Need for hepatitis C screening test 04/27/2022   Seizures (HCC)     Past Surgical History:  Procedure Laterality Date   COLONOSCOPY WITH PROPOFOL N/A 03/14/2022   Procedure: COLONOSCOPY WITH PROPOFOL;  Surgeon: Shellia Cleverly, DO;  Location: [facility];  Service: Gastroenterology;  Laterality: N/A;   POLYPECTOMY  03/14/2022   Procedure: POLYPECTOMY;  Surgeon:  Shellia Cleverly, DO;  Location: [facility];  Service: Gastroenterology;;    Social History:  reports that he has been smoking cigarettes. He started smoking about 15 years ago. He has a 7.9 pack-year smoking history. He has never used smokeless tobacco. He reports current alcohol use of about 42.0 standard drinks of alcohol per week. He reports current drug use. Frequency: 7.00 times per week. Drug: Marijuana.  Allergies:  Allergies  Allergen Reactions   Ibuprofen Anaphylaxis    Medications Prior to Admission  Medication Sig Dispense Refill   acetaminophen (TYLENOL) 325 MG tablet Take 2 tablets (650 mg total) by mouth every 6 (six) hours as needed for mild pain or moderate pain. 90 tablet 0   albuterol (PROVENTIL) (2.5 MG/3ML) 0.083% nebulizer solution Take 3 mLs (2.5 mg total) by nebulization every 6 (six) hours as needed for wheezing or shortness of breath. 75 mL 0   amLODipine (NORVASC) 10 MG tablet Take 1 tablet (10 mg total) by mouth at bedtime. 60 tablet 0   divalproex (DEPAKOTE) 500 MG DR tablet Take 1 tablet (500 mg total) by mouth 2 (two) times daily. 180 tablet 1   ferrous sulfate 324 (65 Fe) MG TBEC Take 1 tablet (325 mg total) by mouth daily at 12 noon. 90 tablet 0   fluconazole (DIFLUCAN) 200 MG tablet Take 0.5 tablets (100 mg total) by mouth  Timothy Hubbard Jul 18, 1991  [id number].    Requesting MD: Katrinka Blazing Chief Complaint/Reason for Consult: C/f hemoperitoneum  HPI:  32 y/o M with a hx of seizures who initially presented 9/19 after he had a witnessed seizure at home.  While on route to the hospital he became bradycardiac and experienced an arrest. ROSC was achieved following one round of CPR.  Upon arrival he underwent a CT AP that did not show any acute findings.  Labs at that time were notable for a Hb of 12. On 9/20 his Hb was <6 and he was requiring pressors despite no obvious course of bleeding.  He continued to require transfusions throughout the day, and on 9/21 experienced another code event that required 2 rounds of CPR. A bedside ultrasound was performed and showed a large volume of intra-peritoneal fluid.  The fluid was aspirated and appeared to be frank blood.  Surgery was consulted to evaluate.  At the time of my exam, patient is intubated and sedated.  He is on 2 pressors and has receiving 3 pRBCs today as well as FFP and cryo.  There is minimal bloody output from his OG but no melena per nursing.  His abdomen is distended but relatively soft.    ROS: Not obtained due to patient factors  Family History  Problem Relation Age of Onset   Hypertension Mother    Cancer Maternal Grandfather        doesn't know the type    Past Medical History:  Diagnosis Date   Acute encephalopathy 03/10/2022   Adenomatous polyp of descending colon    Asthma    Encephalopathy acute 03/01/2022   Hematochezia    HTN (hypertension)    Hypokalemia 03/04/2022   Need for hepatitis C screening test 04/27/2022   Seizures (HCC)     Past Surgical History:  Procedure Laterality Date   COLONOSCOPY WITH PROPOFOL N/A 03/14/2022   Procedure: COLONOSCOPY WITH PROPOFOL;  Surgeon: Shellia Cleverly, DO;  Location: [facility];  Service: Gastroenterology;  Laterality: N/A;   POLYPECTOMY  03/14/2022   Procedure: POLYPECTOMY;  Surgeon:  Shellia Cleverly, DO;  Location: [facility];  Service: Gastroenterology;;    Social History:  reports that he has been smoking cigarettes. He started smoking about 15 years ago. He has a 7.9 pack-year smoking history. He has never used smokeless tobacco. He reports current alcohol use of about 42.0 standard drinks of alcohol per week. He reports current drug use. Frequency: 7.00 times per week. Drug: Marijuana.  Allergies:  Allergies  Allergen Reactions   Ibuprofen Anaphylaxis    Medications Prior to Admission  Medication Sig Dispense Refill   acetaminophen (TYLENOL) 325 MG tablet Take 2 tablets (650 mg total) by mouth every 6 (six) hours as needed for mild pain or moderate pain. 90 tablet 0   albuterol (PROVENTIL) (2.5 MG/3ML) 0.083% nebulizer solution Take 3 mLs (2.5 mg total) by nebulization every 6 (six) hours as needed for wheezing or shortness of breath. 75 mL 0   amLODipine (NORVASC) 10 MG tablet Take 1 tablet (10 mg total) by mouth at bedtime. 60 tablet 0   divalproex (DEPAKOTE) 500 MG DR tablet Take 1 tablet (500 mg total) by mouth 2 (two) times daily. 180 tablet 1   ferrous sulfate 324 (65 Fe) MG TBEC Take 1 tablet (325 mg total) by mouth daily at 12 noon. 90 tablet 0   fluconazole (DIFLUCAN) 200 MG tablet Take 0.5 tablets (100 mg total) by mouth

## 2023-05-27 NOTE — Progress Notes (Signed)
Patient with little UO from foley, complaining of need to void. Bladder scanned and found 415 of volume in bladder. Notified Dr. Katrinka Blazing and attempted to exchange foley. On foley exchange, no urine returned so foley was removed and Dr. Katrinka Blazing notified with no new orders. External foley placed.

## 2023-05-27 NOTE — Progress Notes (Signed)
Preliminary review of EEG reveals symmetric low voltage in all leads with intermittent slow wave activity. No electrographic seizures seen.   Electronically signed: Dr. Caryl Pina

## 2023-05-27 NOTE — Progress Notes (Signed)
Pt in the middle of a code. Try back in an hour or 2.

## 2023-05-27 NOTE — Code Documentation (Addendum)
Code blue called. 1409 Pt  with bradycardia , became unresponsive and then went into PEA. Code was called. CPR was started, ACLS was initiated.He received 2  rounds of EPI, first at 14:09 and them again at 14:13, with  return of pulse. He woke up, was confused , and then within minutes he became more bradycardic and less responsive.EKG was irregular.  He was given an additional amp of Bicarb. The decision was made to intubate the patient as his condition is so tenuous. He is currently being intubated  by Dr. Katrinka Blazing. Code team did respond, but left once Critical care arrived.   Critical Care time 15 minutes

## 2023-05-27 NOTE — Progress Notes (Signed)
Chaplain responded to a code blue alert for Pt. No family present, Pt was able to recover successfully.  05/27/23 1644  Spiritual Encounters  Type of Visit Initial  Care provided to: Patient  Conversation partners present during encounter Nurse  Referral source Code page  Reason for visit Code  OnCall Visit Yes

## 2023-05-27 NOTE — Procedures (Signed)
Arterial Catheter Insertion Procedure Note  KEILAN BADDERS  [id number]  May 08, 1991  Date:05/27/23  Time:4:10 PM    Provider Performing: Lorin Glass    Procedure: Insertion of Arterial Line (84132) with US guidance (44010)   Indication(s) Blood pressure monitoring and/or need for frequent ABGs  Consent Unable to obtain consent due to emergent nature of procedure.  Anesthesia None   Time Out Verified patient identification, verified procedure, site/side was marked, verified correct patient position, special equipment/implants available, medications/allergies/relevant history reviewed, required imaging and test results available.   Sterile Technique Maximal sterile technique including full sterile barrier drape, hand hygiene, sterile gown, sterile gloves, mask, hair covering, sterile ultrasound probe cover (if used).   Procedure Description Area of catheter insertion was cleaned with chlorhexidine and draped in sterile fashion. With real-time ultrasound guidance an arterial catheter was placed into the right radial artery.  Appropriate arterial tracings confirmed on monitor.     Complications/Tolerance None; patient tolerated the procedure well.   EBL Minimal   Specimen(s) None

## 2023-05-27 NOTE — Progress Notes (Signed)
NAME:  Timothy Hubbard, MRN:  [id number], DOB:  1990/12/20, LOS: 2 ADMISSION DATE:  05/25/2023, CONSULTATION DATE:  9/19 REFERRING MD:  Dr. Rhae Hammock, CHIEF COMPLAINT:  seizure/cardiac arrest   History of Present Illness:  Patient is a [name] w/ pertinent PMH seizures, asthma, htn, mood disorder presents to [facility] ED on 9/19 w/ seizure and cardiac arrest.  Per family patient compliant w/ seizure medications.  Had a witnessed seizure at home. EMS arrived and patient unresponsive. On route to ED patient became bradycardic 20-30 and pea arrested. 1 round cpr performed. Patient breathing spontaneously post arrest. Started on epi drip. On ED arrival patient altered but withdrew to painful stimuli. During intubation patient pea arrested and rosc after 1 round of cpr. EKG sinus tachy 130s. Patient remained hypotensive post intubation and cvl placed. Currently on 3 pressors. Temp 92 F and placed on bair hugger. LA 14. Given IV fluids and cultures obtained and placed on broad spectrum antibiotics. CXR w/ ett in good position; no infiltrate appreciated. Abg 6.88, 56, 379, 11. Given 2 amps of bicarb and ordered bicarb drip. CTA head, abd, pelvis ordered/pending. PCCM consulted for icu admission.  Pertinent  Medical History   Past Medical History:  Diagnosis Date   Acute encephalopathy 03/10/2022   Adenomatous polyp of descending colon    Asthma    Encephalopathy acute 03/01/2022   Hematochezia    HTN (hypertension)    Hypokalemia 03/04/2022   Need for hepatitis C screening test 04/27/2022   Seizures (HCC)      Significant Hospital Events: Including procedures, antibiotic start and stop dates in addition to other pertinent events   9/19 admit to [facility]; seizure and cardiac arrest 9/20 extubated  Interim History / Subjective:  Looks and feels a little better. Still on fair bit of pressors.  Objective   Blood pressure (!) 86/71, pulse (!) 119, temperature 98.4 F (36.9 C), resp. rate 20, weight 58.6 kg,  SpO2 96%.    FiO2 (%):  [40 %] 40 %   Intake/Output Summary (Last 24 hours) at 05/27/2023 1009 Last data filed at 05/27/2023 0900 Gross per 24 hour  Intake 9495.19 ml  Output 669 ml  Net 8826.19 ml   Filed Weights   05/25/23 1740  Weight: 58.6 kg    Examination: Ill appearing Dry MM Moves to command but weak Abd benign Heart sounds tachy, ext warm Lungs clear  Ongoing AKI, ALI Plts continue to drop Ongoing anemia Bili stable  Resolved Hospital Problem list   N/A  Assessment & Plan:  OHCA- in context of seizure, severe acidemia Rhabdomyolysis with acute renal and liver failure- from seizures, improving Post arrest encephalopathy- improving Ongoing anemia, probable DIC- no signs/symptoms of bleeding at present Mixed septic/hemorrhagic shock- ongoing transfusion needs Gram positive rod bacteremia- question contamination Small bowel wall thickening- enteritis vs. Sequelae of low flow state, exam benign Acidemia- resolved  - Bicarb gtt to NS - Balanced transfusion strategy: 1 plt, 2 pRBC, 2 FFP this am, check 15:00 DIC panel and CBC - Zosyn, f/u culture speciation - Wean pressors for MAP 65 - AED to keppra, appreciate neurology's help - Father updated at bedside  Best Practice (right click and "Reselect all SmartList Selections" daily)   Diet/type:  DVT prophylaxis: LMWH GI prophylaxis: H2B Lines: Central line Foley:  Yes, and it is still needed Code Status:  full code Last date of multidisciplinary goals of care discussion [pending]  48 min cc time Myrla Halsted MD PCCM 05/26/2023

## 2023-05-27 NOTE — Procedures (Addendum)
Bedside US: vigorous LV function, ++ ascites Ascites tap: pure blood. MTP x 1 round. (4/4/1) Will touch base with [facility]: to start think  CT angio abd/pelvis once more stable. Looking back maybe started bleeding a day or two ago and we have been playing catchup: worsening mental status ?HE vs. Uremia. Have called and left VM for both of his parents.  Additional 65 min cc time independent of procedures  Myrla Halsted MD PCCM

## 2023-05-27 NOTE — Progress Notes (Signed)
Subjective: Slightly confused this morning.   Exam: Vitals:   05/27/23 0815 05/27/23 0830  BP: (!) 108/94 105/80  Pulse:    Resp: (!) 22 (!) 29  Temp: 98.4 F (36.9 C) 98.6 F (37 C)  SpO2:  100%   Gen: In bed, NAD Resp: non-labored breathing, no acute distress Abd: soft, nt  Neuro: MS: Awake, alert, gives month as August, but gives year as 2024.  CN: Visual fields full, EOMI, face symmetric Motor: Able to lift all extremities against gravity Sensory: Intact to light touch  Pertinent Labs: Depakote level 25 on arrival Cr 3.03  Impression: 32 year old male with a history of epilepsy who is managed on Depakote who presented with breakthrough seizure.  His LFTs are markedly elevated and therefore I will change him from Depakote to levetiracetam. He is in AKI as well, and will need to monitor this.   Recommendations: 1) Continue Keppra 500 mg twice daily 2) neurology will follow  Ritta Slot, MD [facility] [phone]  If 7pm- 7am, please page neurology on call as listed in [facility]ION.

## 2023-05-27 NOTE — Progress Notes (Signed)
STAT LTM EEG hooked up and running - no initial skin breakdown - push button tested - [facility] monitoring.

## 2023-05-27 NOTE — Progress Notes (Signed)
05/27/2023 The risks of contrast induced nephropathy are outweighed by the benefits of a potential bleeding vessel being found amenable to IR intervention.  Myrla Halsted MD PCCM

## 2023-05-27 NOTE — Progress Notes (Signed)
Pt is currently at CT.

## 2023-05-28 ENCOUNTER — Inpatient Hospital Stay (HOSPITAL_COMMUNITY): Payer: Medicare Other

## 2023-05-28 DIAGNOSIS — I469 Cardiac arrest, cause unspecified: Secondary | ICD-10-CM | POA: Diagnosis not present

## 2023-05-28 DIAGNOSIS — R569 Unspecified convulsions: Secondary | ICD-10-CM | POA: Diagnosis not present

## 2023-05-28 LAB — BPAM RBC
Blood Product Expiration Date: 202410232359
Blood Product Expiration Date: 202410232359
Blood Product Expiration Date: 202410242359
Blood Product Expiration Date: 202410242359
Blood Product Expiration Date: 202410242359
Blood Product Expiration Date: 202410252359
Blood Product Expiration Date: 202410262359
Blood Product Expiration Date: 202410262359
Blood Product Expiration Date: 202410262359
Blood Product Expiration Date: 202410262359
ISSUE DATE / TIME: 202409200738
ISSUE DATE / TIME: 202409201028
ISSUE DATE / TIME: 202409201807
ISSUE DATE / TIME: 202409202032
ISSUE DATE / TIME: 202409210631
ISSUE DATE / TIME: 202409211509
ISSUE DATE / TIME: 202409211526
ISSUE DATE / TIME: 202409211526
ISSUE DATE / TIME: 202409211526
ISSUE DATE / TIME: 202409211526
Unit Type and Rh: 7300
Unit Type and Rh: 7300
Unit Type and Rh: 7300
Unit Type and Rh: 7300
Unit Type and Rh: 7300
Unit Type and Rh: 7300
Unit Type and Rh: 7300
Unit Type and Rh: 7300
Unit Type and Rh: 7300
Unit Type and Rh: 7300

## 2023-05-28 LAB — POCT I-STAT 7, (LYTES, BLD GAS, ICA,H+H)
Acid-Base Excess: 10 mmol/L — ABNORMAL HIGH (ref 0.0–2.0)
Acid-Base Excess: 10 mmol/L — ABNORMAL HIGH (ref 0.0–2.0)
Acid-Base Excess: 10 mmol/L — ABNORMAL HIGH (ref 0.0–2.0)
Acid-Base Excess: 6 mmol/L — ABNORMAL HIGH (ref 0.0–2.0)
Bicarbonate: 29.9 mmol/L — ABNORMAL HIGH (ref 20.0–28.0)
Bicarbonate: 31.4 mmol/L — ABNORMAL HIGH (ref 20.0–28.0)
Bicarbonate: 33.1 mmol/L — ABNORMAL HIGH (ref 20.0–28.0)
Bicarbonate: 35.3 mmol/L — ABNORMAL HIGH (ref 20.0–28.0)
Calcium, Ion: 0.82 mmol/L — CL (ref 1.15–1.40)
Calcium, Ion: 0.88 mmol/L — CL (ref 1.15–1.40)
Calcium, Ion: 0.89 mmol/L — CL (ref 1.15–1.40)
Calcium, Ion: 0.93 mmol/L — ABNORMAL LOW (ref 1.15–1.40)
HCT: 29 % — ABNORMAL LOW (ref 39.0–52.0)
HCT: 30 % — ABNORMAL LOW (ref 39.0–52.0)
HCT: 30 % — ABNORMAL LOW (ref 39.0–52.0)
HCT: 31 % — ABNORMAL LOW (ref 39.0–52.0)
Hemoglobin: 10.2 g/dL — ABNORMAL LOW (ref 13.0–17.0)
Hemoglobin: 10.2 g/dL — ABNORMAL LOW (ref 13.0–17.0)
Hemoglobin: 10.5 g/dL — ABNORMAL LOW (ref 13.0–17.0)
Hemoglobin: 9.9 g/dL — ABNORMAL LOW (ref 13.0–17.0)
O2 Saturation: 100 %
O2 Saturation: 100 %
O2 Saturation: 98 %
O2 Saturation: 99 %
Patient temperature: 37.4
Patient temperature: 37.7
Patient temperature: 38.1
Patient temperature: 38.4
Potassium: 3.9 mmol/L (ref 3.5–5.1)
Potassium: 4.2 mmol/L (ref 3.5–5.1)
Potassium: 4.2 mmol/L (ref 3.5–5.1)
Potassium: 4.3 mmol/L (ref 3.5–5.1)
Sodium: 131 mmol/L — ABNORMAL LOW (ref 135–145)
Sodium: 132 mmol/L — ABNORMAL LOW (ref 135–145)
Sodium: 132 mmol/L — ABNORMAL LOW (ref 135–145)
Sodium: 132 mmol/L — ABNORMAL LOW (ref 135–145)
TCO2: 31 mmol/L (ref 22–32)
TCO2: 32 mmol/L (ref 22–32)
TCO2: 34 mmol/L — ABNORMAL HIGH (ref 22–32)
TCO2: 37 mmol/L — ABNORMAL HIGH (ref 22–32)
pCO2 arterial: 32.6 mmHg (ref 32–48)
pCO2 arterial: 41.3 mmHg (ref 32–48)
pCO2 arterial: 41.9 mmHg (ref 32–48)
pCO2 arterial: 52.2 mmHg — ABNORMAL HIGH (ref 32–48)
pH, Arterial: 7.439 (ref 7.35–7.45)
pH, Arterial: 7.467 — ABNORMAL HIGH (ref 7.35–7.45)
pH, Arterial: 7.516 — ABNORMAL HIGH (ref 7.35–7.45)
pH, Arterial: 7.595 — ABNORMAL HIGH (ref 7.35–7.45)
pO2, Arterial: 103 mmHg (ref 83–108)
pO2, Arterial: 138 mmHg — ABNORMAL HIGH (ref 83–108)
pO2, Arterial: 155 mmHg — ABNORMAL HIGH (ref 83–108)
pO2, Arterial: 223 mmHg — ABNORMAL HIGH (ref 83–108)

## 2023-05-28 LAB — COMPREHENSIVE METABOLIC PANEL WITH GFR
ALT: 244 U/L — ABNORMAL HIGH (ref 0–44)
AST: 603 U/L — ABNORMAL HIGH (ref 15–41)
Albumin: 3.5 g/dL (ref 3.5–5.0)
Alkaline Phosphatase: 48 U/L (ref 38–126)
Anion gap: 22 — ABNORMAL HIGH (ref 5–15)
BUN: 18 mg/dL (ref 6–20)
CO2: 28 mmol/L (ref 22–32)
Calcium: 7.3 mg/dL — ABNORMAL LOW (ref 8.9–10.3)
Chloride: 83 mmol/L — ABNORMAL LOW (ref 98–111)
Creatinine, Ser: 3.3 mg/dL — ABNORMAL HIGH (ref 0.61–1.24)
GFR, Estimated: 24 mL/min — ABNORMAL LOW
Glucose, Bld: 108 mg/dL — ABNORMAL HIGH (ref 70–99)
Potassium: 4.4 mmol/L (ref 3.5–5.1)
Sodium: 133 mmol/L — ABNORMAL LOW (ref 135–145)
Total Bilirubin: 3.2 mg/dL — ABNORMAL HIGH (ref 0.3–1.2)
Total Protein: 5.9 g/dL — ABNORMAL LOW (ref 6.5–8.1)

## 2023-05-28 LAB — PREPARE PLATELET PHERESIS
Unit division: 0
Unit division: 0
Unit division: 0

## 2023-05-28 LAB — PREPARE FRESH FROZEN PLASMA
Unit division: 0
Unit division: 0
Unit division: 0
Unit division: 0
Unit division: 0
Unit division: 0

## 2023-05-28 LAB — BPAM FFP
Blood Product Expiration Date: 202409262359
Blood Product Expiration Date: 202409262359
Blood Product Expiration Date: 202409262359
Blood Product Expiration Date: 202409262359
Blood Product Expiration Date: 202409262359
Blood Product Expiration Date: 202409262359
ISSUE DATE / TIME: 202409211214
ISSUE DATE / TIME: 202409211214
ISSUE DATE / TIME: 202409211551
ISSUE DATE / TIME: 202409211551
ISSUE DATE / TIME: 202409211551
ISSUE DATE / TIME: 202409211551
Unit Type and Rh: 1700
Unit Type and Rh: 1700
Unit Type and Rh: 7300
Unit Type and Rh: 7300
Unit Type and Rh: 7300
Unit Type and Rh: 7300

## 2023-05-28 LAB — CBC WITH DIFFERENTIAL/PLATELET
Abs Immature Granulocytes: 0.02 10*3/uL (ref 0.00–0.07)
Basophils Absolute: 0.1 10*3/uL (ref 0.0–0.1)
Basophils Relative: 1 %
Eosinophils Absolute: 0 10*3/uL (ref 0.0–0.5)
Eosinophils Relative: 0 %
HCT: 27.9 % — ABNORMAL LOW (ref 39.0–52.0)
Hemoglobin: 9.8 g/dL — ABNORMAL LOW (ref 13.0–17.0)
Immature Granulocytes: 0 %
Lymphocytes Relative: 16 %
Lymphs Abs: 1.4 10*3/uL (ref 0.7–4.0)
MCH: 28.5 pg (ref 26.0–34.0)
MCHC: 35.1 g/dL (ref 30.0–36.0)
MCV: 81.1 fL (ref 80.0–100.0)
Monocytes Absolute: 0.4 10*3/uL (ref 0.1–1.0)
Monocytes Relative: 5 %
Neutro Abs: 6.8 10*3/uL (ref 1.7–7.7)
Neutrophils Relative %: 78 %
Platelets: 46 10*3/uL — ABNORMAL LOW (ref 150–400)
RBC: 3.44 MIL/uL — ABNORMAL LOW (ref 4.22–5.81)
RDW: 15.9 % — ABNORMAL HIGH (ref 11.5–15.5)
Smear Review: DECREASED
WBC: 8.6 10*3/uL (ref 4.0–10.5)
nRBC: 1 % — ABNORMAL HIGH (ref 0.0–0.2)

## 2023-05-28 LAB — TYPE AND SCREEN
ABO/RH(D): B POS
Antibody Screen: NEGATIVE
Unit division: 0
Unit division: 0
Unit division: 0
Unit division: 0
Unit division: 0
Unit division: 0
Unit division: 0
Unit division: 0
Unit division: 0
Unit division: 0

## 2023-05-28 LAB — BPAM PLATELET PHERESIS
Blood Product Expiration Date: 202409222359
Blood Product Expiration Date: 202409222359
Blood Product Expiration Date: 202409252359
ISSUE DATE / TIME: 202409211029
ISSUE DATE / TIME: 202409211533
ISSUE DATE / TIME: 202409211728
Unit Type and Rh: 5100
Unit Type and Rh: 7300
Unit Type and Rh: 6200

## 2023-05-28 LAB — DIC (DISSEMINATED INTRAVASCULAR COAGULATION)PANEL
D-Dimer, Quant: >20 ug/mL-FEU — ABNORMAL HIGH (ref 0.00–0.50)
Fibrinogen: 299 mg/dL (ref 210–475)
INR: 1.1 (ref 0.8–1.2)
Platelets: 48 10*3/uL — ABNORMAL LOW (ref 150–400)
Prothrombin Time: 14.2 seconds (ref 11.4–15.2)
Smear Review: NONE SEEN
aPTT: 33 seconds (ref 24–36)

## 2023-05-28 LAB — GLUCOSE, CAPILLARY
Glucose-Capillary: 120 mg/dL — ABNORMAL HIGH (ref 70–99)
Glucose-Capillary: 101 mg/dL — ABNORMAL HIGH (ref 70–99)
Glucose-Capillary: 108 mg/dL — ABNORMAL HIGH (ref 70–99)
Glucose-Capillary: 112 mg/dL — ABNORMAL HIGH (ref 70–99)
Glucose-Capillary: 126 mg/dL — ABNORMAL HIGH (ref 70–99)
Glucose-Capillary: 123 mg/dL — ABNORMAL HIGH (ref 70–99)

## 2023-05-28 LAB — BPAM CRYOPRECIPITATE
Blood Product Expiration Date: 202409252359
ISSUE DATE / TIME: 202409211701
Unit Type and Rh: 6200

## 2023-05-28 LAB — CBC
HCT: 29.5 % — ABNORMAL LOW (ref 39.0–52.0)
HCT: 28.6 % — ABNORMAL LOW (ref 39.0–52.0)
Hemoglobin: 10.3 g/dL — ABNORMAL LOW (ref 13.0–17.0)
Hemoglobin: 10 g/dL — ABNORMAL LOW (ref 13.0–17.0)
MCH: 28.9 pg (ref 26.0–34.0)
MCH: 27.9 pg (ref 26.0–34.0)
MCHC: 34.9 g/dL (ref 30.0–36.0)
MCHC: 35 g/dL (ref 30.0–36.0)
MCV: 82.6 fL (ref 80.0–100.0)
MCV: 79.9 fL — ABNORMAL LOW (ref 80.0–100.0)
Platelets: 53 10*3/uL — ABNORMAL LOW (ref 150–400)
Platelets: 46 K/uL — ABNORMAL LOW (ref 150–400)
RBC: 3.57 MIL/uL — ABNORMAL LOW (ref 4.22–5.81)
RBC: 3.58 MIL/uL — ABNORMAL LOW (ref 4.22–5.81)
RDW: 15.6 % — ABNORMAL HIGH (ref 11.5–15.5)
RDW: 15.4 % (ref 11.5–15.5)
WBC: 6.4 10*3/uL (ref 4.0–10.5)
WBC: 6.8 K/uL (ref 4.0–10.5)
nRBC: 1.1 % — ABNORMAL HIGH (ref 0.0–0.2)
nRBC: 1 % — ABNORMAL HIGH (ref 0.0–0.2)

## 2023-05-28 LAB — CULTURE, BLOOD (ROUTINE X 2): Special Requests: ADEQUATE

## 2023-05-28 LAB — ECHOCARDIOGRAM LIMITED
Calc EF: 22.6 %
Single Plane A2C EF: 23.1 %
Single Plane A4C EF: 22.1 %
Weight: 2903.02 oz

## 2023-05-28 LAB — TECHNOLOGIST SMEAR REVIEW: Plt Morphology: DECREASED

## 2023-05-28 LAB — LACTATE DEHYDROGENASE: LDH: 754 U/L — ABNORMAL HIGH (ref 98–192)

## 2023-05-28 LAB — HAPTOGLOBIN: Haptoglobin: 15 mg/dL — ABNORMAL LOW (ref 17–317)

## 2023-05-28 LAB — PREPARE CRYOPRECIPITATE: Unit division: 0

## 2023-05-28 LAB — TRIGLYCERIDES: Triglycerides: 326 mg/dL — ABNORMAL HIGH

## 2023-05-28 MED ORDER — DEXTROSE IN LACTATED RINGERS 5 % IV SOLN: INTRAVENOUS | Status: DC

## 2023-05-28 MED ORDER — CALCIUM GLUCONATE-NACL 2-0.675 GM/100ML-% IV SOLN
2.0000 g | Freq: Once | INTRAVENOUS | Status: AC
  Administered 2023-05-28: 2000 mg via INTRAVENOUS
  Filled 2023-05-28: qty 100

## 2023-05-28 MED ORDER — ARFORMOTEROL TARTRATE 15 MCG/2ML IN NEBU
15.0000 ug | INHALATION_SOLUTION | Freq: Two times a day (BID) | RESPIRATORY_TRACT | Status: DC
  Administered 2023-05-28 – 2023-06-12 (×20): 15 ug via RESPIRATORY_TRACT
  Filled 2023-05-28 (×29): qty 2

## 2023-05-28 MED ORDER — OXIDIZED CELLULOSE EX PADS
1.0000 | MEDICATED_PAD | Freq: Once | CUTANEOUS | Status: AC
  Administered 2023-05-28: 1 via TOPICAL
  Filled 2023-05-28: qty 1

## 2023-05-28 MED ORDER — "THROMBI-PAD 3""X3"" EX PADS"
1.0000 | MEDICATED_PAD | Freq: Once | CUTANEOUS | Status: DC
  Filled 2023-05-28: qty 1

## 2023-05-28 MED ORDER — SODIUM CHLORIDE 0.9 % IV SOLN
4.0000 g | Freq: Once | INTRAVENOUS | Status: AC
  Administered 2023-05-28: 4 g via INTRAVENOUS
  Filled 2023-05-28: qty 40

## 2023-05-28 MED ORDER — MAGNESIUM SULFATE 2 GM/50ML IV SOLN
2.0000 g | Freq: Once | INTRAVENOUS | Status: AC
  Administered 2023-05-28: 2 g via INTRAVENOUS
  Filled 2023-05-28: qty 50

## 2023-05-28 NOTE — Progress Notes (Addendum)
NAME:  Timothy Hubbard, MRN:  [id number], DOB:  Dec 01, 1990, LOS: 3 ADMISSION DATE:  05/25/2023, CONSULTATION DATE:  9/19 REFERRING MD:  Dr. Rhae Hammock, CHIEF COMPLAINT:  seizure/cardiac arrest   BRIEF  Patient is a [name] w/ pertinent PMH seizures, asthma, htn, mood disorder presents to [facility] ED on 9/19 w/ seizure and cardiac arrest.  Per family patient compliant w/ seizure medications.  Had a witnessed seizure at home. EMS arrived and patient unresponsive. On route to ED patient became bradycardic 20-30 and pea arrested. 1 round cpr performed. Patient breathing spontaneously post arrest. Started on epi drip. On ED arrival patient altered but withdrew to painful stimuli. During intubation patient pea arrested and rosc after 1 round of cpr. EKG sinus tachy 130s. Patient remained hypotensive post intubation and cvl placed. Currently on 3 pressors. Temp 92 F and placed on bair hugger. LA 14. Given IV fluids and cultures obtained and placed on broad spectrum antibiotics. CXR w/ ett in good position; no infiltrate appreciated. Abg 6.88, 56, 379, 11. Given 2 amps of bicarb and ordered bicarb drip. CTA head, abd, pelvis ordered/pending. PCCM consulted for icu admission.  Pertinent  Medical History   has a past medical history of Acute encephalopathy (03/10/2022), Adenomatous polyp of descending colon, Asthma, Encephalopathy acute (03/01/2022), Hematochezia, HTN (hypertension), Hypokalemia (03/04/2022), Need for hepatitis C screening test (04/27/2022), and Seizures (HCC).   has a past surgical history that includes Colonoscopy with propofol (N/A, 03/14/2022) and polypectomy (03/14/2022).    Significant Hospital Events: Including procedures, antibiotic start and stop dates in addition to other pertinent events   9/19 admit to [facility]; seizure and cardiac arrest 9/20 extubated 05/27/2023: PEA arrest and after woke up and was confused.  Total of 2 rounds of CPR with epi and bicarb x 1.  But became bradycardic > intubated.   Had A-line placed.  Also central line ascites tap did show blood.  Was given 4 units PRBC, 4 units FFP and 2 units platelets and 1 unit of cryo major transfusion protocol Cc consult for hemoperitoneum.  DIC panel positive.  He had some oozing from mouth and IV sites EEG showed symmetric low voltage no/seizure activity Interim History / Subjective:   05/28/2023: Neuroconsult is to continue Keppra.  Neuro does not think seizures were involved in respiratory arrest yesterday.  Depakote changed to Keppra because of elevated liver enzymes.  Surgery consult: No source of active extravasation hemoglobin stable coagulopathy improving.  They do not think there is active bleeding.  No acute surgical indication.  Remains on the ventilator with FiO2 75%.    - On propofol and Versed.   -0 And vasopressin.  On Levophed - Oozing Bleeding from Rt femoral venous line site - Gangrene developing in diigits _LFT/INR. Platelet  Better . Hgb stable despie femoral site ozze. but bili worse - Creat 3.3. K normal. / Bic 33. No acicdosis on aBG -> nearly anuric 62ml/Hr - Massive change in CXR 9/19 - > 922 with mild consolidatin -> now worse and RT effusion   Objective   Blood pressure 92/81, pulse 94, temperature 99.3 F (37.4 C), resp. rate (!) 21, weight 82.3 kg, SpO2 100%.    Vent Mode: PRVC FiO2 (%):  [75 %-100 %] 75 % Set Rate:  [18 bmp-24 bmp] 18 bmp Vt Set:  [470 mL-510 mL] 470 mL PEEP:  [8 cmH20] 8 cmH20 Plateau Pressure:  [25 cmH20-28 cmH20] 28 cmH20   Intake/Output Summary (Last 24 hours) at 05/28/2023 1027 Last  data filed at 05/28/2023 1000 Gross per 24 hour  Intake 7716.76 ml  Output 110 ml  Net 7606.76 ml   Filed Weights   05/25/23 1740 05/28/23 0407  Weight: 58.6 kg 82.3 kg    Examination: General Appearance:  Looks criticall ill Head:  Normocephalic, without obvious abnormality, atraumatic Eyes:  PERRL - yes, conjunctiva/corneas - muddy     Ears:  Normal external ear canals, both  ears Nose:  G tube - NO Throat:  ETT TUBE - y , OG tube - yes Neck:  Supple,  No enlargement/tenderness/nodules Lungs: Clear to auscultation bilaterally, Ventilator   Synchrony - yes, 70% Heart:  S1 and S2 normal, no murmur, CVP - x.  Pressors - levophed and vasopressin Abdomen:  Soft, no masses, no organomegaly Genitalia / Rectal:  Not done Extremities:  Extremities- intact but developing gangreen in digits bialterally first 3 fingers  Skin:  ntact in exposed areas . Sacral area - not examined Neurologic:  Sedation - diprivan gtt and versed gtt (per neuroL for sedation) -> RASS - -4 . Moves all 4s - no. CAM-ICU - no . Orientation - no     Resolved Hospital Problem list   N/A  Assessment & Plan:  OHCA- in context of seizure, severe acidemia -> repeat arrest 05/27/2023 postextubation in the hospital in the setting of hemoperitoneum/DIC  Plan  - circulatory support   Known seizure disorder - on depakot at home Sedation needs on the ventilator and Concern for hypoxemic ischemic encephalopathy  05/27/2023: Followed commands prerepeat cardiac arrest.  Currently deeply sedated.  EEG with generalized slowing  Plan - Discontinue propofol [hyp0rtensive] - Continue Versed infusion - Continue/start fentanyl infusion - Change deopakote to keppra due to shock liver by neuro - RASS sedation score ideally 0 to -2 but currently -4.   Rhabdomyolysis with acute renal and liver failure- from seizures, improving  Plan - Check CK 05/29/2023   Mixed septic/hemorrhagic shock- -status post massive transfusion 05/27/2023 hemoperitoneum 05/27/2023  05/28/2023: Requiring Levophed and vasopressin.  Also on propofol  Plan - MAP goal greater than 65 - Discontinue propofol = Surgery r services following for hemoperitoneum; on the monitoring plan = get eCHO - continue zosyn empioric - await culture   Onset/worsening DIC 05/27/2023 -diffuse oozing sites; status post massive transfusion  05/27/2023 Gangrene digits noted on 05/28/2023-?  Vasopressor effect versus DIC/MAHA  versus undiagnosed catastrophic antiphospholipid syndrome (doubt - no prior hx of APS/SLE)  05/28/2023 DIC parameters such as platelets and INR improving. No drop in hemoglobin  Plan - Monitor DIC panel - Check Cardioliipin.  Antibodies - Check lupus anticoagulant panel - Check ANA, DS DNA, RF, CCP - chjeck  haptoglobin - Check HIT panel -check LDH  - chck duplexLE -check peripheral smear for schistocytes - monitor gangrene (aim to wean off vasopressor to extent possible)  Severe sepsis - worst 05/27/23 with fever 101F and dic  9/22 - improved with zosyn and 1 x  vanc  Plan  -continue zopsyn  - await cultures    Lactic acidosis - improving  Plan  - monitor  Anemia due to critical illness and DIC with hemoperitonieum 05/27/23 s/p massive transufion 05/27/23   Plan  - - PRBC for hgb </= 6.9gm%    - exceptions are   -  if ACS susepcted/confirmed then transfuse for hgb </= 8.0gm%,  or    -  active bleeding with hemodynamic instability, then transfuse regardless of hemoglobin value   At at all times try  to transfuse 1 unit prbc as possible with exception of active hemorrhage  Thrombocytopeni a - new onset, mild at admit 122K). NAdir 19K 05/27/23  (S/p   9/22: improved to 46  Plan  - no AC - monitor - check Doppler LE  Low mag 05/27/23 Hypocalciemia 05/28/23 likely due to blood prodts  Plan  -replete calcium; done - replete mag  Acute renal failure - ne onse t2.38mg % on 9/19 (baseline 0.6)  9/22: worse to 3.3 and getting anuric but pH and K holding. Not on bic gtt  Plan  - back off fluids  - monitor  - no CRRT indication as yet - check UA for casta  Gram positive rod bacteremia- question contamination Small bowel wall thickening- enteritis vs. Sequelae of low flow state, exam benign  Plan  - monitor  Best Practice (right click and "Reselect all SmartList Selections" daily)    Diet/type:  DVT prophylaxis: LMWH GI prophylaxis: H2B Lines: Central line Foley:  Yes, and it is still needed Code Status:  full code Last date of multidisciplinary goals of care discussion [pending]  Family UPDATE: [id number]: Domenica Reamer the dad: LMTCB    ATTESTATION & SIGNATURE   The patient Timothy Hubbard is critically ill with multiple organ systems failure and requires high complexity decision making for assessment and support, frequent evaluation and titration of therapies, application of advanced monitoring technologies and extensive interpretation of multiple databases and discussion with other appropriate health care personnel such as bedside nurses, social workers, case Production designer, theatre/television/film, consultants, respiratory therapists, nutritionists, secretaries etc.,  Critical care time includes but is not restricted to just documentation time. Documentation can happen in parallel or sequential to care time depending on case mix urgency and priorities for the shift. So, overall critical Care Time devoted to patient care services described in this note is  45  Minutes.   This time reflects time of care of this signee Dr Kalman Shan which includ does not reflect procedure time, or teaching time or supervisory time of PA/NP/Med student/Med Resident etc but could involve care discussion time     Dr. Kalman Shan, M.D., [facility]C.P Pulmonary and Critical Care Medicine Staff Physician, [facility] Pager: [phone], If no answer or between  15:00h - 7:00h: call [phone]  05/28/2023 10:27 AM    LABS    PULMONARY Recent Labs  Lab 05/27/23 1722 05/27/23 1927 05/28/23 0020 05/28/23 0336 05/28/23 0616  PHART 7.412 7.399 7.439 7.595* 7.516*  PCO2ART 51.2* 55.7* 52.2* 32.6 41.3  PO2ART 85 103 223* 155* 138*  HCO3 32.8* 34.8* 35.3* 31.4* 33.1*  TCO2 34* 37* 37* 32 34*  O2SAT 97 98 100 100 99    CBC Recent Labs  Lab 05/27/23 0408  05/27/23 1503 05/27/23 1728 05/27/23 1917 05/27/23 1927 05/28/23 0302 05/28/23 0336 05/28/23 0616  HGB 6.3*   < > 10.0* 9.5*   < > 10.0* 10.2* 10.2*  HCT 18.1*   < > 29.0* 27.5*   < > 28.6* 30.0* 30.0*  WBC 7.9  --  5.0  --   --  6.8  --   --   PLT 19*  --  44*  43* 59*  --  48*  46*  --   --    < > = values in this interval not displayed.    COAGULATION Recent Labs  Lab 05/26/23 0507 05/26/23 1404 05/27/23 1728 05/27/23 1917 05/28/23 0302  INR  2.3* 1.7* 1.3* 1.2 1.1    CARDIAC  No results for input(s): "TROPONINI" in the last 168 hours. No results for input(s): "PROBNP" in the last 168 hours.   CHEMISTRY Recent Labs  Lab 05/25/23 1532 05/26/23 0454 05/26/23 0507 05/27/23 0408 05/27/23 1503 05/27/23 1514 05/27/23 1722 05/27/23 1925 05/27/23 1927 05/28/23 0020 05/28/23 0302 05/28/23 0336 05/28/23 0616  NA  --    < > 134* 131*   < > 137   < > 135 134* 132* 133* 131* 132*  K  --    < > 2.7* 3.6   < > 4.1   < > 3.9 3.9 4.3 4.4 4.2 4.2  CL  --   --  93* 84*  --  83*  --  85*  --   --  83*  --   --   CO2  --   --  12* 34*  --  27  --  30  --   --  28  --   --   GLUCOSE  --   --  431* 166*  --  112*  --  131*  --   --  108*  --   --   BUN  --   --  13 16  --  18  --  16  --   --  18  --   --   CREATININE  --   --  2.80* 3.03*  --  3.27*  --  2.83*  --   --  3.30*  --   --   CALCIUM  --   --  6.3* 5.9*  --  6.6*  --  7.8*  --   --  7.3*  --   --   MG 1.8  --  2.2  --   --  1.6*  --   --   --   --   --   --   --   PHOS  --   --  7.7*  --   --   --   --   --   --   --   --   --   --    < > = values in this interval not displayed.   Estimated Creatinine Clearance: 32.1 mL/min (A) (by C-G formula based on SCr of 3.3 mg/dL (H)).   LIVER Recent Labs  Lab 05/25/23 1404 05/25/23 1532 05/26/23 0507 05/26/23 1401 05/26/23 1404 05/27/23 0408 05/27/23 1728 05/27/23 1917 05/28/23 0302  AST 3,849*  --   --  5,642*  --  2,062*  --   --  603*  ALT 1,659*  --   --   738*  --  543*  --   --  244*  ALKPHOS 46  --   --  25*  --  23*  --   --  48  BILITOT 1.6*  --   --  1.2  --  1.3*  --   --  3.2*  PROT 4.1*  --   --  3.2*  --  3.6*  --   --  5.9*  ALBUMIN 2.1*  --   --  1.9*  --  1.9*  --   --  3.5  INR  --    < > 2.3*  --  1.7*  --  1.3* 1.2 1.1   < > = values in this interval not displayed.     INFECTIOUS Recent Labs  Lab 05/25/23 1532 05/26/23 0128  05/27/23 0034 05/27/23 1514 05/27/23 2122  LATICACIDVEN  --    < > 7.3* >9.0* 3.0*  PROCALCITON 0.60  --   --   --   --    < > = values in this interval not displayed.     ENDOCRINE CBG (last 3)  Recent Labs    05/27/23 2317 05/28/23 0308 05/28/23 0715  GLUCAP 120* 120* 101*         IMAGING x48h  - image(s) personally visualized  -   highlighted in bold DG Chest Port 1 View  Result Date: 05/28/2023 CLINICAL DATA:  32 year old male status post intubation. EXAM: PORTABLE CHEST 1 VIEW COMPARISON:  Chest x-ray 05/27/2023. FINDINGS: An endotracheal tube is in place with tip 1.5 cm above the carina. Nasogastric tube extending into the body of the stomach. Transcutaneous defibrillator pad projecting over the lower left hemithorax. Lung volumes are low. Large right and small left pleural effusions. Opacities throughout the right lung and in the left lung base may reflect areas of atelectasis and/or consolidation. No pneumothorax. No evidence of pulmonary edema. Heart size is normal. Mediastinal contours are distorted by patient positioning. IMPRESSION: 1. Support apparatus, as above. 2. Worsening aeration with enlarging large right pleural effusion, small left pleural effusion and areas of atelectasis and/or consolidation throughout the lungs bilaterally (right greater than left). Electronically Signed   By: Trudie Reed M.D.   On: 05/28/2023 08:05   Overnight EEG with video  Result Date: 05/28/2023 Charlsie Quest, MD     05/28/2023  5:52 AM Patient Name: TYANTHONY SATHRE MRN: [id number]  Epilepsy Attending: Charlsie Quest Referring Physician/Provider: Lorin Glass, MD Duration: 05/27/2023 2237 to 05/28/2023 0545  Patient history: 32 year old male with history of seizures, hypertension and asthma who presented after had witnessed seizure. EEG to evaluate for seizure  Level of alertness: comatose  AEDs during EEG study: LEV, versed, propofol  Technical aspects: This EEG study was done with scalp electrodes positioned according to the 10-20 International system of electrode placement. Electrical activity was reviewed with band pass filter of 1-70Hz , sensitivity of 7 uV/mm, display speed of 43mm/sec with a 60Hz  notched filter applied as appropriate. EEG data were recorded continuously and digitally stored.  Video monitoring was available and reviewed as appropriate.  Description: At the beginning of the study, EEG showed burst suppression pattern with bursts if 3-6zh theta-delta slowing lasting 4-6 seconds alternating with 3-5 seconds of generalized suppression. Gradually, EEG showed continuous generalized 3 to 6 Hz theta-delta slowing. Hyperventilation and photic stimulation were not performed.    ABNORMALITY - Continuous slow, generalized  IMPRESSION: This study was initially suggestive of profound diffuse encephalopathy which gradually improved to severe diffuse encephalopathy, likely related to sedation. No seizures or epileptiform discharges were seen throughout the recording.  Priyanka [name]   CT Angio Abd/Pel w/ and/or w/o  Result Date: 05/27/2023 CLINICAL DATA:  Retroperitoneal bleed suspected. Check for bleeding source. EXAM: CTA ABDOMEN AND PELVIS WITHOUT AND WITH CONTRAST TECHNIQUE: Multidetector CT imaging of the abdomen and pelvis was performed using the standard protocol during bolus administration of intravenous contrast. Multiplanar reconstructed images and MIPs were obtained and reviewed to evaluate the vascular anatomy. RADIATION DOSE REDUCTION: This exam was performed  according to the departmental dose-optimization program which includes automated exposure control, adjustment of the mA and/or kV according to patient size and/or use of iterative reconstruction technique. CONTRAST:  75mL OMNIPAQUE IOHEXOL 350 MG/ML SOLN COMPARISON:  Recent CT with IV contrast 05/25/2023. FINDINGS:  VASCULAR Aorta: Normal caliber aorta without aneurysm, dissection, vasculitis or significant stenosis. There are mild scattered infrarenal calcific plaques. Celiac: Small in caliber most likely due to pressor medication(s), but widely patent. No branch occlusion. SMA: Small in caliber most likely due to pressor medication, but widely patent. No branch occlusion. Renals: Both are single. Both are widely patent but with small caliber branch vessels likely due to pressor affect. No branch occlusion. IMA: Small in caliber most likely due to pressor effect, but patent with no branch occlusions. Inflow: Small caliber inflow vessels but no focal atherosclerotic disease, focal stenosis, aneurysm or dissection. Findings likely due to pressor affect. Proximal Outflow: Bilateral common femoral and visualized portions of the superficial and profunda femoral arteries are patent without evidence of aneurysm, dissection, vasculitis or significant stenosis. The visualized outflow arteries small in caliber most likely due to pressor effect. Veins: Interval insertion of a right femoral central line with the tip in the mid right external iliac vein. The portal vein, SMV and splenic vein are clear, with mild prominence of the main portal vein measuring 15 mm. The systemic veins are clear at least through the common iliac veins. Below the common iliac bifurcations the veins are unopacified. Arterial or venous extravasation: None visible throughout. Review of the MIP images confirms the above findings. NON-VASCULAR Lower chest: Since 05/25/2023 there is interval new finding of a moderate right and small left layering pleural  effusions with increased dense consolidation in both adjacent lower lobes, more so on the right. There is patchy increased ground-glass infiltrate in the anterior lung bases consistent with pneumonia or edema. Subareolar gynecomastia asymmetrically on the right. The cardiac size is normal. No pericardial effusion. Hepatobiliary: Moderately steatotic liver measuring 18.5 cm length. No mass enhancement. Vicariously excreted contrast from the prior CT noted in the gallbladder. No wall thickening, stones or biliary dilatation. Pancreas: No focal abnormality or ductal dilatation. Spleen: No abnormality. Adrenals/Urinary Tract: No adrenal mass. No urinary stone or obstruction. No mass enhancement in either kidney. There is a 1 cm Bosniak type 1 cyst in the upper pole of the right kidney anteriorly, Hounsfield density is 16. No follow-up imaging is recommended. Interval removal of prior bladder catheter. The bladder is nondistended and filled with contrast. Stomach/Bowel: NGT passes into the stomach, and loops around upward to the left with the tip in the proximal lumen. The gastric wall is contracted. The small bowel is normal caliber and again demonstrates diffuse thickened folds. The appendix not as well seen as previously due to sizable abdominopelvic fluid volume believed most likely hemorrhage but appears normal in caliber where seen. No focal abnormality in the large bowel wall. Lymphatic: No adenopathy. Reproductive: No prostatomegaly. Both testicles are in the scrotal sac. Other: Diffuse worsening body wall anasarca, probable fluid overload. There previously was mild low-density abdominopelvic free ascites, but there is now a moderate-to-large volume now seen displacing the bowel inward spread diffusely throughout the abdomen and pelvis and scattered within the mesenteric folds, to a lesser extent surrounding the liver and spleen. The density of the fluid is 42-46 Hounsfield units worrisome for abdominopelvic  diffuse hemorrhage. No free air is seen. No visible bleeding source. No abdominal wall hernia or abnormality. Musculoskeletal: No acute or significant osseous findings. IMPRESSION: 1. Small caliber inflow/outflow vessels and visceral arteries most likely due to pressor effect. No focal stenosis, aneurysm or dissection. 2. No visible vascular leak. 3. Interval development of moderate right and small left layering pleural effusions with increased dense consolidation  in both lower lobes, more so on the right, and patchy ground-glass infiltrate in the anterior lung bases consistent with pneumonia or edema. 4. Diffuse worsening body wall anasarca, probable fluid overload. 5. Interval development of a moderate-to-large volume of fluid in the abdomen and pelvis with density of 42-46 Hounsfield units, worrisome for diffuse abdominopelvic hemorrhage. No visible bleeding source. 6. Diffusely thickened small bowel folds, most likely due to edema related to fluid overload. Enteritis not excluded. Ischemic etiology not strictly excluded but there is no pneumatosis or portal venous gas. 7. Moderate hepatic steatosis. 8. NGT passes into the stomach, loops around upward to the left with the tip in the proximal lumen. 9. Interval insertion of a right femoral central line with the tip in the mid right external iliac vein. 10. Subareolar gynecomastia asymmetrically on the right. 11. Critical Value/emergent results were called by telephone at the time of interpretation on 05/27/2023 at 9:14 pm to provider DR. [facility], Who verbally acknowledged these results. Electronically Signed   By: Almira Bar M.D.   On: 05/27/2023 21:40   CT HEAD WO CONTRAST ( )  Result Date: 05/27/2023 CLINICAL DATA:  Delirium EXAM: CT HEAD WITHOUT CONTRAST TECHNIQUE: Contiguous axial images were obtained from the base of the skull through the vertex without intravenous contrast. RADIATION DOSE REDUCTION: This exam was performed according to the  departmental dose-optimization program which includes automated exposure control, adjustment of the mA and/or kV according to patient size and/or use of iterative reconstruction technique. COMPARISON:  05/25/2023 CTA head and neck FINDINGS: Brain: No evidence of acute infarction, hemorrhage, mass, mass effect, or midline shift. No hydrocephalus or extra-axial fluid collection. Vascular: No hyperdense vessel. Skull: Negative for fracture or focal lesion. Sinuses/Orbits: Mucosal thickening throughout the paranasal sinuses, with air-fluid level in the left sphenoid sinus. Fluid in the nasopharynx is likely related to intubation. No acute finding in the orbits. Other: The mastoid air cells are well aerated. IMPRESSION: No acute intracranial process. Electronically Signed   By: Wiliam Ke M.D.   On: 05/27/2023 20:17   DG Chest Port 1 View  Result Date: 05/27/2023 CLINICAL DATA:  [id number] Pneumothorax [id number] EXAM: PORTABLE CHEST 1 VIEW COMPARISON:  May 27, 2023 FINDINGS: The cardiomediastinal silhouette is unchanged in contour.ETT tip terminates 9 mm above the carina. Enteric tube tip and side port project over the stomach. RIGHT IJ CVC tip terminates over the SVC. Small RIGHT pleural effusion and trace LEFT pleural effusion. No significant pneumothorax. Patchy heterogeneous basilar predominant opacities, not significantly changed in comparison to prior. IMPRESSION: 1. Support apparatus as described above. ETT tip terminates 9 mm above the carina. 2. Small RIGHT and trace LEFT pleural effusions. 3. Similar appearance of patchy heterogeneous basilar predominant opacities. This may reflect neurologic pulmonary edema with atelectasis but the differential considerations include aspiration or infection. Electronically Signed   By: Meda Klinefelter M.D.   On: 05/27/2023 18:00   DG CHEST PORT 1 VIEW  Result Date: 05/27/2023 CLINICAL DATA:  Endotracheal tube and OG tube. EXAM: PORTABLE CHEST 1 VIEW COMPARISON:   Chest x-ray 05/25/2023 FINDINGS: Endotracheal tube tip is 1.5 cm above the carina. Orogastric tube tip is in the gastric body/fundus. Right-sided central venous catheter tip projects over the SVC. The cardiomediastinal silhouette is stable and within normal limits. There are new patchy airspace opacities in both lower lungs, right greater than left. There is a new small right pleural effusion. There is no pneumothorax or acute fracture. IMPRESSION: 1. New patchy airspace opacities in both  lower lungs, right greater than left, concerning for pneumonia. 2. New small right pleural effusion. 3. Endotracheal tube tip 1.5 cm above the carina. Electronically Signed   By: Darliss Cheney M.D.   On: 05/27/2023 15:27

## 2023-05-28 NOTE — Progress Notes (Signed)
Unable to obtain accurate pulse ox/SpO2 despite trying multiple sites. ABG drawn throughout the night to check pO2 and O2 saturation.

## 2023-05-28 NOTE — Progress Notes (Signed)
Physician Progress Note and Electrolyte Replacement  Patient Name: Timothy Hubbard DOB: [birth year] MRN: [id number]  Date of Service  05/28/2023   HPI/Events of Note   Recent Labs  Lab 05/25/23 1532 05/26/23 0454 05/26/23 0507 05/27/23 0408 05/27/23 1503 05/27/23 1514 05/27/23 1722 05/27/23 1925 05/27/23 1927 05/28/23 0020 05/28/23 0302 05/28/23 0336 05/28/23 0616 05/28/23 1648  NA  --    < > 134* 131*   < > 137   < > 135   < > 132* 133* 131* 132* 132*  K  --    < > 2.7* 3.6   < > 4.1   < > 3.9   < > 4.3 4.4 4.2 4.2 3.9  CL  --   --  93* 84*  --  83*  --  85*  --   --  83*  --   --   --   CO2  --   --  12* 34*  --  27  --  30  --   --  28  --   --   --   GLUCOSE  --   --  431* 166*  --  112*  --  131*  --   --  108*  --   --   --   BUN  --   --  13 16  --  18  --  16  --   --  18  --   --   --   CREATININE  --   --  2.80* 3.03*  --  3.27*  --  2.83*  --   --  3.30*  --   --   --   CALCIUM  --   --  6.3* 5.9*  --  6.6*  --  7.8*  --   --  7.3*  --   --   --   MG 1.8  --  2.2  --   --  1.6*  --   --   --   --   --   --   --   --   PHOS  --   --  7.7*  --   --   --   --   --   --   --   --   --   --   --    < > = values in this interval not displayed.     Latest Reference Range & Units 05/25/23 14:14 05/25/23 14:29 05/26/23 04:54 05/27/23 15:03 05/27/23 17:22 05/27/23 19:27 05/28/23 00:20 05/28/23 03:36 05/28/23 06:16 05/28/23 16:48  Calcium Ionized 1.15 - 1.40 mmol/L 0.90 (L) 0.98 (L) 0.87 (LL) 0.81 (LL) 0.73 (LL) 0.94 (L) 0.88 (LL) 0.82 (LL) 0.89 (LL) 0.93 (L)  (LL): Data is critically low (L): Data is abnormally low  Estimated Creatinine Clearance: 32.1 mL/min (A) (by C-G formula based on SCr of 3.3 mg/dL (H)).  Intake/Output      09/21 0701 09/22 0700 09/22 0701 09/23 0700   P.O.     I.V. (mL/kg) 3136.4 (38.1) 287.4 (3.5)   Blood 4924.8    NG/GT 60    IV Piggyback 1009.2 49.8   Total Intake(mL/kg) 9130.5 (110.9) 337.2 (4.1)   Urine (mL/kg/hr) 95 (0)     Emesis/NG output 50    Stool 0    Total Output 145    Net +8985.5 +337.2        Stool Occurrence 2 x     -  I/O DETAILED x 24h    Total I/O In: 337.2 [I.V.:287.4; IV Piggyback:49.8] Out: -  - I/O THIS SHIFT    ASSESSMENT LOW CALCIUM  eICURN Interventions  Replete calcium    ASSESSMENT: MAJOR ELECTROLYTE      Dr. Kalman Shan, M.D., [facility].C.P Pulmonary and Critical Care Medicine Staff Physician [facility] Pager: [phone], If no answer or between  15:00h - 7:00h: call [phone]  05/28/2023 6:13 PM

## 2023-05-28 NOTE — Progress Notes (Signed)
Pts fathered arrived to bedside around 2020 visiting/asking for update. His father stated  "I was working yesterday and then my cell phone died."  Gave pts father brief update on yesterday events and made him aware that the day team had tried multiple times to call him with updates. Pts father stated he would be at bedside tomorrow morning to meet with the MDs.

## 2023-05-28 NOTE — Progress Notes (Signed)
Subjective: Patient had a recurrent cardiac arrest yesterday  Exam: Vitals:   05/28/23 0645 05/28/23 0700  BP: 91/71 92/81  Pulse:    Resp: 18 18  Temp: (!) 100.6 F (38.1 C) (!) 100.6 F (38.1 C)  SpO2:     Gen: In bed, intubated, he is currently sedated  Neuro: MS: He does not open eyes or follow commands  CN: Pupils are reactive and corneals are intact  motor: No response to noxious stimulation Sensory: As above  Pertinent Labs: GFR 24  Impression: 32 year old male with a history of epilepsy who is managed on Depakote who presented with breakthrough seizure.  He had stated that there was some medication he was about to be changed to, but was not sure which one.  He is not sure if he might of missed a dose.  In any case, given his elevated LFTs I have changed him from Depakote to Keppra.  I would favor continuing this for the time being.  There was no definite seizure activity at onset of his arrest yesterday, though he did have some leftward gaze as he was braiding down I feel this is nonspecific.  I will continue LTM EEG for now given his decreased responsiveness and underlying seizure disorder.  Appreciate CCM.   Recommendations: 1) Continue Keppra 500 mg twice daily 2) neurology will follow  Ritta Slot, MD [facility] [phone]  If 7pm- 7am, please page neurology on call as listed in AMION.

## 2023-05-28 NOTE — Progress Notes (Signed)
Echocardiogram 2D Echocardiogram has been performed.  Warren Lacy Bernarda Erck RDCS 05/28/2023, 2:18 PM

## 2023-05-28 NOTE — Progress Notes (Signed)
Subjective: Hgb stable at 10 this morning. Abdomen soft.    Objective: Vital signs in last 24 hours: Temp:  [94 F (34.4 C)-100.6 F (38.1 C)] 100.6 F (38.1 C) (09/22 0700) Pulse Rate:  [73-198] 95 (09/22 0545) Resp:  [14-45] 18 (09/22 0700) BP: (76-128)/(38-93) 92/81 (09/22 0700) SpO2:  [52 %-100 %] 99 % (09/22 0615) Arterial Line BP: (93-186)/(60-98) 111/76 (09/22 0700) FiO2 (%):  [75 %-100 %] 75 % (09/22 0736) Weight:  [82.3 kg] 82.3 kg (09/22 0407) Last BM Date : 05/28/23  Intake/Output from previous day: 09/21 0701 - 09/22 0700 In: 9082.5 [I.V.:3088.5; Blood:4924.8; NG/GT:60; IV Piggyback:1009.2] Out: 145 [Urine:95; Emesis/NG output:50] Intake/Output this shift: No intake/output data recorded.  PE: General: intubated, on vent Neuro: sedated Resp: ETT Vent Mode: PRVC FiO2 (%):  [75 %-100 %] 75 % Set Rate:  [18 bmp-24 bmp] 18 bmp Vt Set:  [470 mL-510 mL] 470 mL PEEP:  [8 cmH20] 8 cmH20 Plateau Pressure:  [25 cmH20-28 cmH20] 28 cmH20 CV: RRR Abdomen: abdomen is very soft and nondistended    Lab Results:  Recent Labs    05/27/23 1728 05/27/23 1917 05/27/23 1927 05/28/23 0302 05/28/23 0336 05/28/23 0616  WBC 5.0  --   --  6.8  --   --   HGB 10.0* 9.5*   < > 10.0* 10.2* 10.2*  HCT 29.0* 27.5*   < > 28.6* 30.0* 30.0*  PLT 44*  43* 59*  --  48*  46*  --   --    < > = values in this interval not displayed.   BMET Recent Labs    05/27/23 1925 05/27/23 1927 05/28/23 0302 05/28/23 0336 05/28/23 0616  NA 135   < > 133* 131* 132*  K 3.9   < > 4.4 4.2 4.2  CL 85*  --  83*  --   --   CO2 30  --  28  --   --   GLUCOSE 131*  --  108*  --   --   BUN 16  --  18  --   --   CREATININE 2.83*  --  3.30*  --   --   CALCIUM 7.8*  --  7.3*  --   --    < > = values in this interval not displayed.   PT/INR Recent Labs    05/27/23 1917 05/28/23 0302  LABPROT 15.5* 14.2  INR 1.2 1.1   CMP     Component Value Date/Time   NA 132 (L) 05/28/2023  0616   NA 139 05/22/2023 1213   K 4.2 05/28/2023 0616   CL 83 (L) 05/28/2023 0302   CO2 28 05/28/2023 0302   GLUCOSE 108 (H) 05/28/2023 0302   BUN 18 05/28/2023 0302   BUN 9 05/22/2023 1213   CREATININE 3.30 (H) 05/28/2023 0302   CREATININE 1.03 10/19/2016 1017   CALCIUM 7.3 (L) 05/28/2023 0302   PROT 5.9 (L) 05/28/2023 0302   PROT 5.8 (L) 02/13/2023 1725   ALBUMIN 3.5 05/28/2023 0302   ALBUMIN 3.5 (L) 02/13/2023 1725   AST 603 (H) 05/28/2023 0302   ALT 244 (H) 05/28/2023 0302   ALKPHOS 48 05/28/2023 0302   BILITOT 3.2 (H) 05/28/2023 0302   BILITOT <0.2 02/13/2023 1725   GFRNONAA 24 (L) 05/28/2023 0302   GFRNONAA >89 10/19/2016 1017   GFRAA 138 02/04/2020 1101   GFRAA >89 10/19/2016 1017   Lipase  No results found for: "LIPASE"  Assessment/Plan 32 yo male admitted with seizures and cardiac arrest, with spontaneous hemoperitoneum in the setting of DIC. CTA yesterday did not show a source of active extravasation. Hgb is stable this morning and coagulopathy improving. Do not suspect any ongoing bleeding. No indication for acute surgical intervention. Will continue to follow.    LOS: 3 days    Sophronia Simas, MD [facility] Surgery General, Hepatobiliary and Pancreatic Surgery 05/28/23 9:31 AM

## 2023-05-28 NOTE — Procedures (Signed)
Patient Name: Timothy Hubbard  MRN: [id number]  Epilepsy Attending: Charlsie Quest  Referring Physician/Provider: Lorin Glass, MD  Duration: 05/27/2023 2237 to 05/28/2023 2237   Patient history: 32 year old male with history of seizures, hypertension and asthma who presented after had witnessed seizure. EEG to evaluate for seizure   Level of alertness: comatose   AEDs during EEG study: LEV, versed, propofol   Technical aspects: This EEG study was done with scalp electrodes positioned according to the 10-20 International system of electrode placement. Electrical activity was reviewed with band pass filter of 1-70Hz , sensitivity of 7 uV/mm, display speed of 77mm/sec with a 60Hz  notched filter applied as appropriate. EEG data were recorded continuously and digitally stored.  Video monitoring was available and reviewed as appropriate.   Description: At the beginning of the study, EEG showed burst suppression pattern with bursts if 3-6zh theta-delta slowing lasting 4-6 seconds alternating with 3-5 seconds of generalized suppression. Gradually, EEG showed continuous generalized 3 to 6 Hz theta-delta slowing. Hyperventilation and photic stimulation were not performed.      ABNORMALITY - Continuous slow, generalized   IMPRESSION: This study was initially suggestive of profound diffuse encephalopathy which gradually improved to severe diffuse encephalopathy, likely related to sedation. No seizures or epileptiform discharges were seen throughout the recording.   Tzippy Testerman [name]

## 2023-05-29 ENCOUNTER — Inpatient Hospital Stay (HOSPITAL_COMMUNITY): Payer: MEDICAID

## 2023-05-29 DIAGNOSIS — I82452 Acute embolism and thrombosis of left peroneal vein: Secondary | ICD-10-CM

## 2023-05-29 DIAGNOSIS — R569 Unspecified convulsions: Secondary | ICD-10-CM | POA: Diagnosis not present

## 2023-05-29 DIAGNOSIS — R609 Edema, unspecified: Secondary | ICD-10-CM | POA: Diagnosis not present

## 2023-05-29 DIAGNOSIS — I469 Cardiac arrest, cause unspecified: Secondary | ICD-10-CM | POA: Diagnosis not present

## 2023-05-29 LAB — BILIRUBIN, FRACTIONATED(TOT/DIR/INDIR)
Bilirubin, Direct: 1.9 mg/dL — ABNORMAL HIGH (ref 0.0–0.2)
Indirect Bilirubin: 1.7 mg/dL — ABNORMAL HIGH (ref 0.3–0.9)
Total Bilirubin: 3.6 mg/dL — ABNORMAL HIGH (ref 0.3–1.2)

## 2023-05-29 LAB — LUPUS ANTICOAGULANT PANEL
DRVVT: 35.1 s (ref 0.0–47.0)
PTT Lupus Anticoagulant: 40.1 s (ref 0.0–43.5)

## 2023-05-29 LAB — CBC
HCT: 26.9 % — ABNORMAL LOW (ref 39.0–52.0)
Hemoglobin: 9.4 g/dL — ABNORMAL LOW (ref 13.0–17.0)
MCH: 28.9 pg (ref 26.0–34.0)
MCHC: 34.9 g/dL (ref 30.0–36.0)
MCV: 82.8 fL (ref 80.0–100.0)
Platelets: 32 10*3/uL — ABNORMAL LOW (ref 150–400)
RBC: 3.25 MIL/uL — ABNORMAL LOW (ref 4.22–5.81)
RDW: 15.5 % (ref 11.5–15.5)
WBC: 8.1 10*3/uL (ref 4.0–10.5)
nRBC: 1 % — ABNORMAL HIGH (ref 0.0–0.2)

## 2023-05-29 LAB — GLUCOSE, CAPILLARY
Glucose-Capillary: 146 mg/dL — ABNORMAL HIGH (ref 70–99)
Glucose-Capillary: 93 mg/dL (ref 70–99)
Glucose-Capillary: 92 mg/dL (ref 70–99)
Glucose-Capillary: 103 mg/dL — ABNORMAL HIGH (ref 70–99)
Glucose-Capillary: 106 mg/dL — ABNORMAL HIGH (ref 70–99)
Glucose-Capillary: 104 mg/dL — ABNORMAL HIGH (ref 70–99)

## 2023-05-29 LAB — POCT I-STAT 7, (LYTES, BLD GAS, ICA,H+H)
Acid-Base Excess: 9 mmol/L — ABNORMAL HIGH (ref 0.0–2.0)
Bicarbonate: 31.1 mmol/L — ABNORMAL HIGH (ref 20.0–28.0)
Calcium, Ion: 0.94 mmol/L — ABNORMAL LOW (ref 1.15–1.40)
HCT: 28 % — ABNORMAL LOW (ref 39.0–52.0)
Hemoglobin: 9.5 g/dL — ABNORMAL LOW (ref 13.0–17.0)
O2 Saturation: 98 %
Patient temperature: 37.4
Potassium: 3.6 mmol/L (ref 3.5–5.1)
Sodium: 133 mmol/L — ABNORMAL LOW (ref 135–145)
TCO2: 32 mmol/L (ref 22–32)
pCO2 arterial: 33.7 mmHg (ref 32–48)
pH, Arterial: 7.574 — ABNORMAL HIGH (ref 7.35–7.45)
pO2, Arterial: 96 mmHg (ref 83–108)

## 2023-05-29 LAB — COMPREHENSIVE METABOLIC PANEL
ALT: 254 U/L — ABNORMAL HIGH (ref 0–44)
AST: 600 U/L — ABNORMAL HIGH (ref 15–41)
Albumin: 2.9 g/dL — ABNORMAL LOW (ref 3.5–5.0)
Alkaline Phosphatase: 74 U/L (ref 38–126)
Anion gap: 21 — ABNORMAL HIGH (ref 5–15)
BUN: 19 mg/dL (ref 6–20)
CO2: 26 mmol/L (ref 22–32)
Calcium: 8 mg/dL — ABNORMAL LOW (ref 8.9–10.3)
Chloride: 88 mmol/L — ABNORMAL LOW (ref 98–111)
Creatinine, Ser: 3.8 mg/dL — ABNORMAL HIGH (ref 0.61–1.24)
GFR, Estimated: 21 mL/min — ABNORMAL LOW (ref >60)
Glucose, Bld: 130 mg/dL — ABNORMAL HIGH (ref 70–99)
Potassium: 3.5 mmol/L (ref 3.5–5.1)
Sodium: 135 mmol/L (ref 135–145)
Total Bilirubin: 3.9 mg/dL — ABNORMAL HIGH (ref 0.3–1.2)
Total Protein: 5.7 g/dL — ABNORMAL LOW (ref 6.5–8.1)

## 2023-05-29 LAB — URINALYSIS, ROUTINE W REFLEX MICROSCOPIC
Bilirubin Urine: NEGATIVE
Glucose, UA: NEGATIVE mg/dL
Ketones, ur: 5 mg/dL — AB
Nitrite: NEGATIVE
Protein, ur: 100 mg/dL — AB
RBC / HPF: >50 RBC/hpf (ref 0–5)
Specific Gravity, Urine: 1.024 (ref 1.005–1.030)
pH: 8 (ref 5.0–8.0)

## 2023-05-29 LAB — RETICULOCYTES
Immature Retic Fract: 21.3 % — ABNORMAL HIGH (ref 2.3–15.9)
RBC.: 3.25 MIL/uL — ABNORMAL LOW (ref 4.22–5.81)
Retic Count, Absolute: 29.3 10*3/uL (ref 19.0–186.0)
Retic Ct Pct: 0.9 % (ref 0.4–3.1)

## 2023-05-29 LAB — APTT: aPTT: 30 seconds (ref 24–36)

## 2023-05-29 LAB — PROTIME-INR
INR: 1.1 (ref 0.8–1.2)
Prothrombin Time: 14.6 seconds (ref 11.4–15.2)

## 2023-05-29 LAB — DIC (DISSEMINATED INTRAVASCULAR COAGULATION)PANEL
D-Dimer, Quant: >20 ug/mL-FEU — ABNORMAL HIGH (ref 0.00–0.50)
Fibrinogen: 383 mg/dL (ref 210–475)
INR: 1.1 (ref 0.8–1.2)
Platelets: 30 10*3/uL — ABNORMAL LOW (ref 150–400)
Prothrombin Time: 14.7 seconds (ref 11.4–15.2)
Smear Review: NONE SEEN
aPTT: 29 seconds (ref 24–36)

## 2023-05-29 LAB — CK: Total CK: 1183 U/L — ABNORMAL HIGH (ref 49–397)

## 2023-05-29 LAB — MAGNESIUM
Magnesium: 1.9 mg/dL (ref 1.7–2.4)
Magnesium: 2.4 mg/dL (ref 1.7–2.4)

## 2023-05-29 LAB — PHOSPHORUS
Phosphorus: 6.2 mg/dL — ABNORMAL HIGH (ref 2.5–4.6)
Phosphorus: 5.2 mg/dL — ABNORMAL HIGH (ref 2.5–4.6)

## 2023-05-29 MED ORDER — VITAL 1.5 CAL PO LIQD
1000.0000 mL | ORAL | Status: DC
  Administered 2023-05-29 – 2023-05-31 (×3): 1000 mL

## 2023-05-29 MED ORDER — SODIUM CHLORIDE 0.9 % IV SOLN
3.0000 g | Freq: Two times a day (BID) | INTRAVENOUS | Status: AC
  Administered 2023-05-29 – 2023-06-01 (×7): 3 g via INTRAVENOUS
  Filled 2023-05-29 (×7): qty 8

## 2023-05-29 MED ORDER — MAGNESIUM SULFATE 2 GM/50ML IV SOLN
2.0000 g | Freq: Once | INTRAVENOUS | Status: AC
  Administered 2023-05-29: 2 g via INTRAVENOUS
  Filled 2023-05-29: qty 50

## 2023-05-29 MED ORDER — CALCIUM GLUCONATE-NACL 2-0.675 GM/100ML-% IV SOLN
2.0000 g | Freq: Once | INTRAVENOUS | Status: AC
  Administered 2023-05-29: 2000 mg via INTRAVENOUS
  Filled 2023-05-29: qty 100

## 2023-05-29 MED ORDER — HEPARIN (PORCINE) 25000 UT/250ML-% IV SOLN
1200.0000 [IU]/h | INTRAVENOUS | Status: DC
  Administered 2023-05-29: 850 [IU]/h via INTRAVENOUS
  Filled 2023-05-29: qty 250

## 2023-05-29 MED ORDER — POTASSIUM CHLORIDE 20 MEQ PO PACK
40.0000 meq | PACK | Freq: Once | ORAL | Status: AC
  Administered 2023-05-29: 40 meq
  Filled 2023-05-29: qty 2

## 2023-05-29 NOTE — Consult Note (Signed)
Referral Timothy Hubbard  Reason for Referral: Coagulopathy and subsequent thromboembolic event in the RIGHT peroneal vein  No chief complaint on file. : Patient is intubated and sedated.  HPI: Timothy Hubbard is a very nice 32 year old African-American male.  He has a very significant history.  He has a history of seizures.  He has a history of asthma.  He apparently was admitted on 05/25/2023 with a cardiac arrest.  He had a witnessed seizure.  EMS was called.  He is unresponsive.  He then went into PEA cardiac arrest.  He was resuscitated in about 2 minutes.  He got to the ER and then again went into cardiac arrest.  He was intubated and admitted to the ICU.  When he is admitted, he is found to have a "shock liver".  His ALT was 738 and AST 5642.  His LDH was 2000.  Lactic acid greater than 9.  His CK was 3654.  His blood sugar was 431.  His BUN 13 creatinine 2.8.  Calcium 6.3 with an albumin of 1.9.  When he came in, his CBC showed a white count of 5.5.  Hemoglobin 6.4.  Platelet count 32,000.  On 05/27/2023, his white cell count 7.9.  Hemoglobin 6.3.  Platelet count 19,000.  When he came in, his pro time was over 90 seconds.  His PTT was 46 seconds.  He had a lot of oozing from his coagulopathy.  I am sure that he received some FFP.  His PTT and PT/INR have normalized.  He unfortunately developed a thrombus in the right peroneal vein.  This was found today.  He now is on heparin.  He is on multiple other drips.  He is on Neo-Synephrine.  I have to believe that the whole coagulopathy and status is secondary to his liver failure.  His liver function is slowly improving.  Today, his SGPT was 254 SGOT 600.  His bilirubin was 3.9.  CK was 1183.  LDH was 754.  His BUN and creatinine are still high at 19 and 3.8.  His CBC today shows white count 8.1.  Hemoglobin 9.4.  Platelet count 30,000.  It would not surprise me if he takes several days if not weeks for his platelet count to rise.  It is not usual for  patients to do have cardiac arrest to have poor bone marrow function for several weeks after the event just because of lack of blood flow to the bone marrow.  I think looking at his blood smear was important.  His blood smear was relatively unrevealing.  I do not see any schistocytes.  He had a mature.  White blood cells.  I do not see any blasts.  He had no rouleaux formation.  He had no target cells.  There are no sickle cells.  Platelets were decreased in number.  Platelets were relatively small in size.  Again, before he came into the hospital his blood counts were normal.  On 04/23/2023, his white cell count was 6.  Hemoglobin 11.3.  Platelet count 291,000.  I would like to believe that his CBC will recover.  It may take quite a while.  I certainly would anticoagulate him.  I would use heparin for right now.  He does have the thrombocytopenia.  I would not think that he would have a hypercoagulable state.  However, given the fact that he is young, he concerning qualified.  I know that hypercoagulable studies have been sent off.  As far as the length of  time for anticoagulation, this is certainly not clear right now.  I think a lot will depend on how much he recovers.  Currently, I would say that his performance status is probably ECOG 3.    Past Medical History:  Diagnosis Date   Acute encephalopathy 03/10/2022   Adenomatous polyp of descending colon    Asthma    Encephalopathy acute 03/01/2022   Hematochezia    HTN (hypertension)    Hypokalemia 03/04/2022   Need for hepatitis C screening test 04/27/2022   Seizures (HCC)   :   Past Surgical History:  Procedure Laterality Date   COLONOSCOPY WITH PROPOFOL N/A 03/14/2022   Procedure: COLONOSCOPY WITH PROPOFOL;  Surgeon: Shellia Cleverly, DO;  Location: [facility];  Service: Gastroenterology;  Laterality: N/A;   POLYPECTOMY  03/14/2022   Procedure: POLYPECTOMY;  Surgeon: Shellia Cleverly, DO;  Location: [facility];   Service: Gastroenterology;;  :   Current Facility-Administered Medications:    0.9 %  sodium chloride infusion, 250 mL, Intravenous, Continuous, Chand, Sudham, Timothy Hubbard, Last Rate: 10 mL/hr at 05/29/23 1800, Infusion Verify at 05/29/23 1800   0.9 %  sodium chloride infusion, , Intravenous, PRN, Cheri Fowler, Timothy Hubbard, Stopped at 05/28/23 1959   Ampicillin-Sulbactam (UNASYN) 3 g in sodium chloride 0.9 % 100 mL IVPB, 3 g, Intravenous, Q12H, Weingarten, Rachael I, RPH, Stopped at 05/29/23 1614   arformoterol (BROVANA) nebulizer solution 15 mcg, 15 mcg, Nebulization, BID, Lorin Glass, Timothy Hubbard, 15 mcg at 05/29/23 0733   budesonide (PULMICORT) nebulizer solution 0.5 mg, 0.5 mg, Nebulization, BID, Lidia Collum, PA-C, 0.5 mg at 05/29/23 4010   Chlorhexidine Gluconate Cloth 2 % PADS 6 each, 6 each, Topical, Daily, Chand, Garnet Sierras, Timothy Hubbard, 6 each at 05/29/23 1020   dextrose 5 % in lactated ringers infusion, , Intravenous, Continuous, Ramaswamy, Murali, Timothy Hubbard, Last Rate: 50 mL/hr at 05/29/23 1800, Infusion Verify at 05/29/23 1800   docusate (COLACE) 50 MG/5ML liquid 100 mg, 100 mg, Per Tube, BID, Bell, Lorin C, RPH   feeding supplement (VITAL 1.5 CAL) liquid 1,000 mL, 1,000 mL, Per Tube, Continuous, Agarwala, Ravi, Timothy Hubbard, Last Rate: 20 mL/hr at 05/29/23 1800, Infusion Verify at 05/29/23 1800   fentaNYL (SUBLIMAZE) injection 50 mcg, 50 mcg, Intravenous, Q15 min PRN, Lorin Glass, Timothy Hubbard, 50 mcg at 05/27/23 1438   fentaNYL (SUBLIMAZE) injection 50-200 mcg, 50-200 mcg, Intravenous, Q30 min PRN, Lorin Glass, Timothy Hubbard, 100 mcg at 05/29/23 2725   heparin ADULT infusion 100 units/mL (25000 units/258mL), 850 Units/hr, Intravenous, Continuous, Weingarten, Rachael I, RPH, Last Rate: 8.5 mL/hr at 05/29/23 1800, 850 Units/hr at 05/29/23 1800   insulin aspart (novoLOG) injection 0-9 Units, 0-9 Units, Subcutaneous, Q4H, Bell, Lorin C, RPH, 1 Units at 05/29/23 0316   ipratropium-albuterol (DUONEB) 0.5-2.5 (3) MG/3ML nebulizer solution 3 mL, 3 mL,  Nebulization, Q4H PRN, Pia Mau D, PA-C, 3 mL at 05/28/23 2002   levETIRAcetam (KEPPRA) tablet 500 mg, 500 mg, Per Tube, BID, Bell, Lorin C, RPH, 500 mg at 05/29/23 1101   midazolam (VERSED) 100 mg/100 mL (1 mg/mL) premix infusion, 0-10 mg/hr, Intravenous, Continuous, Lorin Glass, Timothy Hubbard, Last Rate: 2 mL/hr at 05/29/23 1800, 2 mg/hr at 05/29/23 1800   midazolam (VERSED) bolus via infusion 0-5 mg, 0-5 mg, Intravenous, Q1H PRN, Lorin Glass, Timothy Hubbard   norepinephrine (LEVOPHED) 16 mg in (0.064 mg/mL) premix infusion, 0-60 mcg/min, Intravenous, Continuous, Cheri Fowler, Timothy Hubbard, Stopped at 05/29/23 0146   ondansetron (ZOFRAN) injection 4 mg, 4 mg, Intravenous, Q6H PRN, Chand,  Garnet Sierras, Timothy Hubbard   Oral care mouth rinse, 15 mL, Mouth Rinse, Q2H, Lorin Glass, Timothy Hubbard, 15 mL at 05/29/23 1747   Oral care mouth rinse, 15 mL, Mouth Rinse, PRN, Lorin Glass, Timothy Hubbard   pantoprazole (PROTONIX) injection 40 mg, 40 mg, Intravenous, Q12H, Champ Mungo, DO, 40 mg at 05/29/23 1020   polyethylene glycol (MIRALAX / GLYCOLAX) packet 17 g, 17 g, Per Tube, Daily, Bell, Lorin C, RPH   sodium chloride flush (NS) 0.9 % injection 10-40 mL, 10-40 mL, Intracatheter, Q12H, Lorin Glass, Timothy Hubbard, 40 mL at 05/29/23 1021   sodium chloride flush (NS) 0.9 % injection 10-40 mL, 10-40 mL, Intracatheter, PRN, Lorin Glass, Timothy Hubbard   thiamine (VITAMIN B1) tablet 100 mg, 100 mg, Per Tube, Daily, Bell, Lorin C, RPH, 100 mg at 05/29/23 1020   vasopressin (PITRESSIN) 20 Units in 100 mL (0.2 unit/mL) infusion-*FOR SHOCK*, 0-0.04 Units/min, Intravenous, Continuous, Olalere, Adewale A, Timothy Hubbard, Stopped at 05/29/23 0601   white petrolatum (VASELINE) gel, , Topical, PRN, Cheri Fowler, Timothy Hubbard:   arformoterol  15 mcg Nebulization BID   budesonide (PULMICORT) nebulizer solution  0.5 mg Nebulization BID   Chlorhexidine Gluconate Cloth  6 each Topical Daily   docusate  100 mg Per Tube BID   insulin aspart  0-9 Units Subcutaneous Q4H   levETIRAcetam  500 mg Per Tube BID    mouth rinse  15 mL Mouth Rinse Q2H   pantoprazole (PROTONIX) IV  40 mg Intravenous Q12H   polyethylene glycol  17 g Per Tube Daily   sodium chloride flush  10-40 mL Intracatheter Q12H   thiamine  100 mg Per Tube Daily  :   Allergies  Allergen Reactions   Ibuprofen Anaphylaxis  :   Family History  Problem Relation Age of Onset   Hypertension Mother    Cancer Maternal Grandfather        doesn't know the type  :   Social History   Socioeconomic History   Marital status: Single    Spouse name: Not on file   Number of children: 0   Years of education: Not on file   Highest education level: Not on file  Occupational History   Occupation: disablity    Comment: Doesn't want to discuss  Tobacco Use   Smoking status: Every Day    Current packs/day: 0.50    Average packs/day: 0.5 packs/day for 15.7 years (7.9 ttl pk-yrs)    Types: Cigarettes    Start date: 2009   Smokeless tobacco: Never  Vaping Use   Vaping status: Former  Substance and Sexual Activity   Alcohol use: Yes    Alcohol/week: 42.0 standard drinks of alcohol    Types: 42 Cans of beer per week    Comment: every 2-3 days   Drug use: Yes    Frequency: 7.0 times per week    Types: Marijuana    Comment: 3-5 times per day   Sexual activity: Yes    Partners: Female    Birth control/protection: Condom    Comment: occasiional condom use  Other Topics Concern   Not on file  Social History Narrative   Not on file   Social Determinants of Health   Financial Resource Strain: Medium Risk (04/01/2022)   Overall Financial Resource Strain (CARDIA)    Difficulty of Paying Living Expenses: Somewhat hard  Food Insecurity: No Food Insecurity (04/23/2023)   Hunger Vital Sign    Worried About Running Out of Food in the Last Year: Never  true    Ran Out of Food in the Last Year: Never true  Transportation Needs: No Transportation Needs (04/23/2023)   PRAPARE - Administrator, Civil Service (Medical): No     Lack of Transportation (Non-Medical): No  Physical Activity: Not on file  Stress: Stress Concern Present (01/05/2023)   [employer] of Occupational Health - Occupational Stress Questionnaire    Feeling of Stress : To some extent  Social Connections: Not on file  Intimate Partner Violence: Not At Risk (04/23/2023)   Humiliation, Afraid, Rape, and Kick questionnaire    Fear of Current or Ex-Partner: No    Emotionally Abused: No    Physically Abused: No    Sexually Abused: No  :  Pertinent items are noted in HPI.  Exam:Physical Exam Vitals reviewed.  Constitutional:      Comments: This is an intubated African-American male.  He appears to be in decent shape.  I do not see any real muscle atrophy.  HENT:     Head: Normocephalic and atraumatic.  Eyes:     Pupils: Pupils are equal, round, and reactive to light.  Cardiovascular:     Rate and Rhythm: Normal rate and regular rhythm.     Heart sounds: Normal heart sounds.  Pulmonary:     Effort: Pulmonary effort is normal.     Breath sounds: Normal breath sounds.  Abdominal:     General: Bowel sounds are normal.     Palpations: Abdomen is soft.  Musculoskeletal:        General: No tenderness or deformity. Normal range of motion.     Cervical back: Normal range of motion.     Comments: There is a little bit of swelling in his legs.  Again, I really do not palpate a obvious venous cord in the right lower leg.  Lymphadenopathy:     Cervical: No cervical adenopathy.  Skin:    General: Skin is warm and dry.     Findings: No erythema or rash.     Comments: He may have some scattered ecchymoses.  Neurological:     Mental Status: He is alert and oriented to person, place, and time.  Psychiatric:        Behavior: Behavior normal.        Thought Content: Thought content normal.        Judgment: Judgment normal.     Patient Vitals for the past 24 hrs:  BP Temp Temp src Pulse Resp SpO2 Weight  05/29/23 1830 -- 99.5 F (37.5 C) --  -- 18 -- --  05/29/23 1815 -- 99.7 F (37.6 C) -- -- 19 -- --  05/29/23 1800 -- 99.7 F (37.6 C) -- -- 20 -- --  05/29/23 1745 -- 99.7 F (37.6 C) -- -- 15 -- --  05/29/23 1730 -- 99.7 F (37.6 C) -- -- 19 -- --  05/29/23 1715 -- 99.9 F (37.7 C) -- -- 20 -- --  05/29/23 1700 -- 99.7 F (37.6 C) -- -- (!) 23 -- --  05/29/23 1645 -- 99.7 F (37.6 C) -- -- (!) 21 -- --  05/29/23 1630 -- 99.7 F (37.6 C) -- -- 20 -- --  05/29/23 1615 -- 100 F (37.8 C) -- (!) 110 15 97 % --  05/29/23 1610 -- -- -- -- -- 99 % --  05/29/23 1600 -- (!) 100.6 F (38.1 C) -- (!) 113 (!) 21 100 % --  05/29/23 1545 -- (!) 101.1 F (38.4  C) -- (!) 118 18 100 % --  05/29/23 1530 -- (!) 100.9 F (38.3 C) -- (!) 117 (!) 21 100 % --  05/29/23 1515 -- (!) 100.8 F (38.2 C) -- (!) 115 19 99 % --  05/29/23 1500 -- (!) 100.4 F (38 C) -- (!) 115 (!) 21 95 % --  05/29/23 1445 -- 100 F (37.8 C) -- -- 19 -- --  05/29/23 1430 -- 99.9 F (37.7 C) -- -- 19 -- --  05/29/23 1415 -- 99.5 F (37.5 C) -- -- 18 -- --  05/29/23 1400 -- 99.1 F (37.3 C) -- (!) 108 20 (!) 85 % --  05/29/23 1345 -- 98.8 F (37.1 C) -- (!) 107 20 95 % --  05/29/23 1330 -- 98.6 F (37 C) -- (!) 106 18 100 % --  05/29/23 1315 -- 98.4 F (36.9 C) -- (!) 107 18 100 % --  05/29/23 1300 -- 98.4 F (36.9 C) -- (!) 108 (!) 22 97 % --  05/29/23 1245 -- (!) 97.3 F (36.3 C) -- (!) 112 17 91 % --  05/29/23 1230 -- 98.2 F (36.8 C) -- (!) 104 19 100 % --  05/29/23 1215 -- 97.9 F (36.6 C) -- (!) 102 19 100 % --  05/29/23 1200 -- (!) 97.5 F (36.4 C) -- 98 18 100 % --  05/29/23 1145 -- (!) 97.2 F (36.2 C) -- 100 19 100 % --  05/29/23 1130 -- (!) 97 F (36.1 C) -- 98 (!) 22 100 % --  05/29/23 1125 -- -- -- -- -- 100 % --  05/29/23 1115 -- (!) 96.8 F (36 C) -- 96 17 100 % --  05/29/23 1100 -- (!) 96.4 F (35.8 C) -- -- 14 -- --  05/29/23 1045 -- (!) 96.8 F (36 C) -- 98 16 100 % --  05/29/23 1030 -- (!) 96.8 F (36 C) -- 63 18 (!)  61 % --  05/29/23 1015 -- (!) 96.8 F (36 C) -- 65 (!) 25 (!) 73 % --  05/29/23 1000 -- (!) 96.8 F (36 C) -- -- 15 -- --  05/29/23 0945 -- (!) 97 F (36.1 C) -- -- (!) 22 -- --  05/29/23 0930 -- (!) 97 F (36.1 C) -- (!) 103 17 100 % --  05/29/23 0915 -- (!) 97.2 F (36.2 C) -- (!) 107 19 100 % --  05/29/23 0900 -- (!) 97.3 F (36.3 C) -- -- 18 -- --  05/29/23 0845 -- (!) 97.5 F (36.4 C) -- (!) 103 14 96 % --  05/29/23 0830 -- 97.7 F (36.5 C) -- (!) 105 16 99 % --  05/29/23 0815 -- 97.7 F (36.5 C) -- (!) 101 15 100 % --  05/29/23 0800 93/79 (!) 97.5 F (36.4 C) -- 99 14 100 % --  05/29/23 0745 -- 97.7 F (36.5 C) -- 97 14 100 % --  05/29/23 0735 -- -- -- -- -- 100 % --  05/29/23 0730 -- 98.1 F (36.7 C) -- -- 16 -- --  05/29/23 0715 -- 98.1 F (36.7 C) -- -- 17 -- --  05/29/23 0700 (!) 89/73 97.9 F (36.6 C) -- -- 18 -- --  05/29/23 0645 -- 97.9 F (36.6 C) -- -- 17 -- --  05/29/23 0630 -- 98.1 F (36.7 C) -- -- 20 -- --  05/29/23 0615 -- 98.2 F (36.8 C) -- -- 18 -- --  05/29/23 0600 (!) 82/65  98.2 F (36.8 C) Esophageal -- 16 -- --  05/29/23 0545 -- 98.2 F (36.8 C) -- -- 18 -- --  05/29/23 0530 -- 98.4 F (36.9 C) -- -- 18 -- --  05/29/23 0515 -- 98.4 F (36.9 C) -- -- 18 -- --  05/29/23 0500 (!) 80/64 98.6 F (37 C) -- -- 19 -- 179 lb 14.3 oz (81.6 kg)  05/29/23 0445 -- 98.8 F (37.1 C) -- -- 18 -- --  05/29/23 0430 -- 98.8 F (37.1 C) -- -- 20 -- --  05/29/23 0415 -- 99 F (37.2 C) -- -- 17 -- --  05/29/23 0400 (!) 83/72 99 F (37.2 C) -- -- (!) 22 -- --  05/29/23 0355 -- 99 F (37.2 C) -- -- 14 -- --  05/29/23 0345 -- 99.1 F (37.3 C) -- -- 20 -- --  05/29/23 0330 -- 99.1 F (37.3 C) -- -- 20 -- --  05/29/23 0315 -- 99.3 F (37.4 C) -- -- 20 -- --  05/29/23 0314 -- 99.3 F (37.4 C) -- -- 20 -- --  05/29/23 0300 (!) 89/69 99.3 F (37.4 C) -- -- 20 98 % --  05/29/23 0245 -- 99.5 F (37.5 C) -- -- 20 -- --  05/29/23 0230 -- 99.5 F (37.5 C)  -- -- 20 -- --  05/29/23 0215 -- 99.7 F (37.6 C) -- (!) 20 20 -- --  05/29/23 0200 97/83 99.9 F (37.7 C) Esophageal -- 20 -- --  05/29/23 0145 -- 99.9 F (37.7 C) -- -- 20 -- --  05/29/23 0130 -- 99.9 F (37.7 C) -- -- 20 -- --  05/29/23 0115 -- 99.7 F (37.6 C) -- -- 20 -- --  05/29/23 0100 (!) 88/69 99.9 F (37.7 C) -- -- (!) 21 -- --  05/29/23 0045 -- 99.9 F (37.7 C) -- -- (!) 21 -- --  05/29/23 0030 -- 99.9 F (37.7 C) -- -- 19 -- --  05/29/23 0015 -- 99.9 F (37.7 C) -- -- 20 -- --  05/29/23 0000 97/62 99.9 F (37.7 C) Esophageal -- 20 -- --  05/28/23 2345 -- 99.9 F (37.7 C) -- -- 20 -- --  05/28/23 2339 -- 99.9 F (37.7 C) -- -- 20 -- --  05/28/23 2330 -- 99.9 F (37.7 C) -- -- 20 -- --  05/28/23 2315 -- 99.9 F (37.7 C) -- -- (!) 21 -- --  05/28/23 2300 102/83 99.9 F (37.7 C) -- -- 18 -- --  05/28/23 2245 -- 99.7 F (37.6 C) -- -- (!) 22 -- --  05/28/23 2230 -- 99.7 F (37.6 C) -- -- 20 -- --  05/28/23 2215 -- 99.9 F (37.7 C) -- -- 20 -- --  05/28/23 2200 94/77 100 F (37.8 C) Esophageal -- 20 100 % --  05/28/23 2145 -- 100 F (37.8 C) -- -- 20 -- --  05/28/23 2130 -- 100.2 F (37.9 C) -- -- 20 -- --  05/28/23 2115 -- (!) 100.4 F (38 C) -- -- 20 -- --  05/28/23 2100 101/89 (!) 100.4 F (38 C) -- -- -- -- --  05/28/23 2045 -- (!) 100.6 F (38.1 C) -- -- 20 -- --  05/28/23 2030 -- (!) 100.8 F (38.2 C) -- (!) 181 (!) 24 (!) 88 % --  05/28/23 2015 -- (!) 100.6 F (38.1 C) -- 93 20 (!) 87 % --  05/28/23 2003 -- -- -- -- -- 98 % --  05/28/23 2002 95/74 Marland Kitchen)  100.8 F (38.2 C) -- 93 20 100 % --  05/28/23 2000 95/74 (!) 100.8 F (38.2 C) Esophageal 94 20 100 % --  05/28/23 1955 -- (!) 100.8 F (38.2 C) -- -- 20 98 % --      Recent Labs    05/28/23 1053 05/28/23 1648 05/29/23 0226 05/29/23 0309  WBC 8.6  --  8.1  --   HGB 9.8*   < > 9.4* 9.5*  HCT 27.9*   < > 26.9* 28.0*  PLT 46*  --  30*  32*  --    < > = values in this interval not  displayed.    Recent Labs    05/28/23 0302 05/28/23 0336 05/29/23 0226 05/29/23 0309  NA 133*   < > 135 133*  K 4.4   < > 3.5 3.6  CL 83*  --  88*  --   CO2 28  --  26  --   GLUCOSE 108*  --  130*  --   BUN 18  --  19  --   CREATININE 3.30*  --  3.80*  --   CALCIUM 7.3*  --  8.0*  --    < > = values in this interval not displayed.    Blood smear review: See above  Pathology: None    Assessment and Plan: Mr. Grossnickle is a very nice 32 year old African-American male.  He developed cardiac arrest.  He had a seizure.  He had a "shock "liver.  He had incredible elevation of liver function.  He has a coagulopathy is secondary to his poor liver function.  His PT/PTT have normalized at this point.  He has the thrombus in the right peroneal vein.  For right now, I probably would have him on anticoagulation.  I think heparin is the only way to go right now.  I would like to believe that his platelet count will slowly rise.  I would not think that he would have any issues with HITT.  I think the long-term anticoagulation issue is not clear at all right now.  We do not know how well he will recover from this insult.  I do feel bad for him.  I am sure he is a real nice guy.  It looks like he does have a very serious issue with the seizures.  I know that he will get incredible care from everybody in the ICU.  I would definitely keep the anticoagulation level at the lower end of therapeutic.  If everything stabilizes, we might be able to convert over to Lovenox.  I would not use anything oral right now since he really is not eating.    Timothy Bach,  Timothy Hubbard  [name] 1:5

## 2023-05-29 NOTE — Progress Notes (Signed)
Subjective: LTM read negative.   Exam: Vitals:   05/29/23 0700 05/29/23 0735  BP: (!) 89/73   Pulse:    Resp: 18   Temp: 97.9 F (36.6 C)   SpO2:  100%   Gen: In bed, intubated, he is currently sedated (fentanyl, versed).  Currently requiring multiple pressors.  Neuro: MS: He does not open eyes or follow commands  CN: Pupils are reactive and corneals are intact  motor: No response to noxious stimulation Sensory: As above  Pertinent Labs: pH: 7.574 Phos: 6.2 D-dimer: > 20.00 Platelets: 30 Cr: 3.80 Ca: 8.0 Albumin: 2.9 AST: 600 ALT: 254 Total Bili: 3.9 LA: 14.3 -> 9.0 -> 7.3  9/23 LTM Read: This study was suggestive of severe diffuse encephalopathy, likely related to sedation. No seizures or epileptiform discharges were seen throughout the recording.   Impression: 32 year old male with a history of epilepsy who is managed on Depakote who presented with breakthrough seizure. Patient was not sure if he had missed any doses, family states compliance. Depakote was changed to Keppra due to elevated LFTs.  LTM continued yesterday due to some leftward gaze, decreased responsiveness and underlying seizure disorder.   Recommendations: - Continue Keppra 500 mg twice daily - Continue LTM EEG for another 24 hours. If negative, can DC.  - Neurology will follow   Attending Neurohospitalist Addendum Patient seen and examined with APP/Resident. Agree with the history and physical as documented above. Agree with the plan as documented, which I helped formulate. I have edited the note above to reflect my full findings and recommendations. I have independently reviewed the chart, obtained history, review of systems and examined the patient.I have personally reviewed pertinent head/neck/spine imaging (CT/MRI). Please feel free to call with any questions.  This patient is critically ill and at significant risk of neurological worsening, death and care requires constant monitoring of  vital signs, hemodynamics,respiratory and cardiac monitoring, neurological assessment, discussion with family, other specialists and medical decision making of high complexity. I spent 40 minutes of neurocritical care time  in the care of  this patient. This was time spent independent of any time provided by nurse practitioner or PA.  Bing Neighbors, MD [facility] [phone]  If 7pm- 7am, please page neurology on call as listed in [facility].

## 2023-05-29 NOTE — Progress Notes (Signed)
Initial Nutrition Assessment  DOCUMENTATION CODES:   Not applicable  INTERVENTION:  Initiate tube feeding via OGT: Vital 1.5 at 60 ml/h (1440 ml per day) Start at 35mL/h and advance by 10,L q12h to goal Provides 2160 kcal, 97 gm protein, 1100 ml free water daily  NUTRITION DIAGNOSIS:   Inadequate oral intake related to inability to eat as evidenced by NPO status.  GOAL:   Patient will meet greater than or equal to 90% of their needs  MONITOR:   TF tolerance, I & O's, Vent status, Labs, Weight trends  REASON FOR ASSESSMENT:   Consult Enteral/tube feeding initiation and management  ASSESSMENT:  Pt with hx of seizure disorder, tobacco use, EtOH abuse, and HTN presented to ED after having a seizure at home. Arrested in ED and required intubation and initiation of pressor support.  9/19 - admitted, arrest x 2 in ED, intubated 9/20 - extubated 9/21 - PEA arrest, intubated  Patient is currently intubated on ventilator support. No family at bedside to provide a hx. OGT in place (noted to be in the body of the stomach on imaging).  On exam, pt with significant edema, particular to the forearms and thighs. Current weight is significantly elevated from admission. Pressors currently on hold. On significant sedation and undergoing EEG at this time.   Discussed with MD and RN, ok to initiate enteral support.  MV: 6.6 L/min Temp (24hrs), Avg:100 F (37.8 C), Min:97.9 F (36.6 C), Max:101.3 F (38.5 C)   Intake/Output Summary (Last 24 hours) at 05/29/2023 1021 Last data filed at 05/29/2023 0700 Gross per 24 hour  Intake 1711.43 ml  Output 355 ml  Net 1356.43 ml  Net IO Since Admission: 25,571.68 mL [05/29/23 1021]  Admit weight: 58.6 Current weight: 81.6 kg Pt with significant edema - unsure of usual dry weight  Nutritionally Relevant Medications: Scheduled Meds:  docusate  100 mg Per Tube BID   insulin aspart  0-9 Units Subcutaneous Q4H   pantoprazole IV  40 mg  Intravenous Q12H   polyethylene glycol  17 g Per Tube Daily   thiamine  100 mg Per Tube Daily   Continuous Infusions:  dextrose 5% lactated ringers 50 mL/hr at 05/29/23 0700   midazolam 3 mg/hr (05/29/23 0700)   norepinephrine (LEVOPHED) Adult infusion Stopped (05/29/23 0146)   piperacillin-tazobactam (ZOSYN)  IV 3.375 g (05/29/23 0809)   vasopressin Stopped (05/29/23 0601)   PRN Meds: ondansetron  Labs Reviewed: Na 133, chloride 88 Creatinine 3.8 Phosphorus 6.2 CBG ranges from 93-146 mg/dL over the last 24 hours HgbA1c 5.7%  NUTRITION - FOCUSED PHYSICAL EXAM: Significant edema present. Unable to perform complete exam at this time as muscle and fat stores can not be palpated. No signs of micronutrient deficiencies.  Flowsheet Row Most Recent Value  Orbital Region No depletion  Upper Arm Region Unable to assess  Thoracic and Lumbar Region Unable to assess  Buccal Region No depletion  Temple Region No depletion  Clavicle Bone Region No depletion  Clavicle and Acromion Bone Region Mild depletion  Scapular Bone Region Unable to assess  Dorsal Hand Unable to assess  Patellar Region Unable to assess  Anterior Thigh Region Unable to assess  Posterior Calf Region Unable to assess  Edema (RD Assessment) Severe  [generalized, BLE and BUE]  Hair Reviewed  Eyes Reviewed  Mouth Reviewed  Skin Reviewed   Diet Order:   Diet Order     None       EDUCATION NEEDS:   Not appropriate  for education at this time  Skin:  Skin Assessment: Reviewed RN Assessment  Last BM:  9/22 - type 7  Height:  Ht Readings from Last 1 Encounters:  05/22/23 5\' 6"  (1.676 m)    Weight:  Wt Readings from Last 1 Encounters:  05/29/23 81.6 kg    Ideal Body Weight:  64.5 kg  BMI:  Body mass index is 29.04 kg/m.  Estimated Nutritional Needs:  Kcal:  1900-2200 kcal/d Protein:  95-110 g/d Fluid:  2L/d    Greig Castilla, RD, LDN Clinical Dietitian RD pager # available in [facility]  After  hours/weekend pager # available in [facility]

## 2023-05-29 NOTE — Plan of Care (Signed)
  Problem: Skin Integrity: Goal: Risk for impaired skin integrity will be minimized. Outcome: Progressing   Problem: Activity: Goal: Risk for activity intolerance will decrease Outcome: Progressing   Problem: Nutrition: Goal: Adequate nutrition will be maintained Outcome: Progressing

## 2023-05-29 NOTE — Progress Notes (Addendum)
ANTICOAGULATION CONSULT NOTE - Initial Consult  Pharmacy Consult for heparin Indication: DVT  Allergies  Allergen Reactions   Ibuprofen Anaphylaxis    Patient Measurements: Weight: 81.6 kg (179 lb 14.3 oz) Heparin Dosing Weight: 82 kg  Vital Signs: Temp: 98.8 F (37.1 C) (09/23 1345) Temp Source: Esophageal (09/23 0600) BP: 93/79 (09/23 0800) Pulse Rate: 107 (09/23 1345)  Labs: Recent Labs    05/27/23 1514 05/27/23 1722 05/27/23 1917 05/27/23 1925 05/27/23 1927 05/28/23 0302 05/28/23 0336 05/28/23 1053 05/28/23 1648 05/29/23 0226 05/29/23 0309 05/29/23 0927 05/29/23 1231  HGB  --    < > 9.5*  --    < > 10.0*   < > 9.8* 9.9* 9.4* 9.5*  --   --   HCT  --    < > 27.5*  --    < > 28.6*   < > 27.9* 29.0* 26.9* 28.0*  --   --   PLT  --    < > 59*  --   --  48*  46*  --  46*  --  30*  32*  --   --   --   APTT  --    < > 37*  --   --  33  --   --   --  29  --   --  30  LABPROT  --    < > 15.5*  --   --  14.2  --   --   --  14.7  --   --  14.6  INR  --    < > 1.2  --   --  1.1  --   --   --  1.1  --   --  1.1  CREATININE 3.27*  --   --  2.83*  --  3.30*  --   --   --  3.80*  --   --   --   CKTOTAL  --   --   --   --   --   --   --   --   --   --   --  1,183*  --   TROPONINIHS 210*  --  240*  --   --   --   --   --   --   --   --   --   --    < > = values in this interval not displayed.    Estimated Creatinine Clearance: 27.7 mL/min (A) (by C-G formula based on SCr of 3.8 mg/dL (H)).   Medical History: Past Medical History:  Diagnosis Date   Acute encephalopathy 03/10/2022   Adenomatous polyp of descending colon    Asthma    Encephalopathy acute 03/01/2022   Hematochezia    HTN (hypertension)    Hypokalemia 03/04/2022   Need for hepatitis C screening test 04/27/2022   Seizures (HCC)     Medications:  Scheduled:   arformoterol  15 mcg Nebulization BID   budesonide (PULMICORT) nebulizer solution  0.5 mg Nebulization BID   Chlorhexidine Gluconate Cloth  6  each Topical Daily   docusate  100 mg Per Tube BID   insulin aspart  0-9 Units Subcutaneous Q4H   levETIRAcetam  500 mg Per Tube BID   mouth rinse  15 mL Mouth Rinse Q2H   pantoprazole (PROTONIX) IV  40 mg Intravenous Q12H   polyethylene glycol  17 g Per Tube Daily   sodium chloride flush  10-40 mL Intracatheter  Q12H   thiamine  100 mg Per Tube Daily   Infusions:   sodium chloride Stopped (05/29/23 1230)   sodium chloride Stopped (05/28/23 1959)   ampicillin-sulbactam (UNASYN) IV     dextrose 5% lactated ringers 50 mL/hr at 05/29/23 1300   feeding supplement (VITAL 1.5 CAL) 20 mL/hr at 05/29/23 1300   midazolam 3 mg/hr (05/29/23 1300)   norepinephrine (LEVOPHED) Adult infusion Stopped (05/29/23 0146)   vasopressin Stopped (05/29/23 0601)    Assessment: 32 yo male who presented with cardiac arrest 2/2 to seizure. Patient now presenting with new DVT. Notable history of abdominopelvic hemorrhage on CT angio (9/21) with massive transfusion. aPTT/INR elevated on admission, now back to normal. Heme/onc consult pending, confirmed with CCM to go ahead and start heparin. Hgb 9.5, plt 30 (BL 300s), no signs of new bleeding noted. Pharmacy consulted to dose heparin in the setting of DVT.   Goal of Therapy:  Heparin level 0.3-0.5 units/ml Monitor platelets by anticoagulation protocol: Yes   Plan:  No bolus in setting of increased bleed risk  Start heparin gtt at 850 units/hr  6 hour heparin level and CBC Daily CBC and heparin level while on heparin Monitor for s/sx of bleeding   Timothy Hubbard 05/29/2023,3:05 PM

## 2023-05-29 NOTE — Consult Note (Addendum)
[facility] Resident Consult Note  Reason for Consult: AKI Referring Physician: Lynnell Catalan, MD  HPI:  Hospital day 4.  32 y.o. with history of seizures, hypertension, and asthma presented for seizures complicated by PEA cardiac arrest x 2, approximately 3 minutes total downtime. Admitted to ICU for treatment of shock of mixed etiology, cardiogenic and septic due to aspiration pneumonia. Now with AKI due to ischemic ATN. Hospital course complicated by hemoperitoneum and acute anemia in setting of DIC. Also with DVT in RLE. Now off of norepinephrine and vasopressin. Oliguric now, creatinine trending up.  Trend in Creatinine: Creatinine, Ser  Date/Time Value Ref Range Status  05/29/2023 02:26 AM 3.80 (H) 0.61 - 1.24 mg/dL Final  69/62/9528 41:32 AM 3.30 (H) 0.61 - 1.24 mg/dL Final  44/09/270 53:66 PM 2.83 (H) 0.61 - 1.24 mg/dL Final  44/11/4740 59:56 PM 3.27 (H) 0.61 - 1.24 mg/dL Final  38/75/6433 29:51 AM 3.03 (H) 0.61 - 1.24 mg/dL Final  88/41/6606 30:16 AM 2.80 (H) 0.61 - 1.24 mg/dL Final  09/13/3233 57:32 PM 2.00 (H) 0.61 - 1.24 mg/dL Final  20/25/4270 62:37 PM 2.38 (H) 0.61 - 1.24 mg/dL Final  62/83/1517 61:60 PM 0.64 (L) 0.76 - 1.27 mg/dL Final  73/71/0626 94:85 AM 0.61 0.61 - 1.24 mg/dL Final  46/27/0350 09:38 PM 0.60 (L) 0.61 - 1.24 mg/dL Final  18/29/9371 69:67 PM 0.53 (L) 0.76 - 1.27 mg/dL Final  89/38/1017 51:02 AM 0.80 0.76 - 1.27 mg/dL Final  58/52/7782 42:35 AM 0.59 (L) 0.61 - 1.24 mg/dL Final  36/14/4315 40:08 AM 0.56 (L) 0.61 - 1.24 mg/dL Final  67/61/9509 32:67 AM 0.54 (L) 0.61 - 1.24 mg/dL Final  12/45/8099 83:38 AM 0.66 0.61 - 1.24 mg/dL Final  25/01/3975 73:41 AM 0.63 0.61 - 1.24 mg/dL Final  93/79/0240 97:35 AM 0.68 0.61 - 1.24 mg/dL Final  32/99/2426 83:41 AM 0.66 0.61 - 1.24 mg/dL Final  96/22/2979 89:21 AM 0.67 0.61 - 1.24 mg/dL Final  19/41/7408 14:48 AM 0.70 0.61 - 1.24 mg/dL Final  18/56/3149 70:26 AM 0.83 0.61 - 1.24 mg/dL Final   37/85/8850 27:74 PM 1.29 (H) 0.61 - 1.24 mg/dL Final  12/87/8676 72:09 AM 0.81 0.76 - 1.27 mg/dL Final  47/05/6282 66:29 PM 0.65 0.40 - 1.50 mg/dL Final    PMH:   Past Medical History:  Diagnosis Date   Acute encephalopathy 03/10/2022   Adenomatous polyp of descending colon    Asthma    Encephalopathy acute 03/01/2022   Hematochezia    HTN (hypertension)    Hypokalemia 03/04/2022   Need for hepatitis C screening test 04/27/2022   Seizures (HCC)     PSH:   Past Surgical History:  Procedure Laterality Date   COLONOSCOPY WITH PROPOFOL N/A 03/14/2022   Procedure: COLONOSCOPY WITH PROPOFOL;  Surgeon: Shellia Cleverly, DO;  Location: [facility];  Service: Gastroenterology;  Laterality: N/A;   POLYPECTOMY  03/14/2022   Procedure: POLYPECTOMY;  Surgeon: Shellia Cleverly, DO;  Location: [facility]OSCOPY;  Service: Gastroenterology;;    Allergies:  Allergies  Allergen Reactions   Ibuprofen Anaphylaxis    Medications:   Prior to Admission medications   Medication Sig Start Date End Date Taking? Authorizing Provider  acetaminophen (TYLENOL) 325 MG tablet Take 2 tablets (650 mg total) by mouth every 6 (six) hours as needed for mild pain or moderate pain. 03/30/22  Yes Littie Deeds, MD  albuterol (PROVENTIL) (2.5 MG/3ML) 0.083% nebulizer solution Take 3 mLs (2.5 mg total) by nebulization every 6 (six) hours as needed  for wheezing or shortness of breath. 04/04/23  Yes Vonna Drafts, MD  amLODipine (NORVASC) 10 MG tablet Take 1 tablet (10 mg total) by mouth at bedtime. 05/22/23  Yes Hindel, Leah, MD  divalproex (DEPAKOTE) 500 MG DR tablet Take 1 tablet (500 mg total) by mouth 2 (two) times daily. 04/04/23  Yes Vonna Drafts, MD  ferrous sulfate 324 (65 Fe) MG TBEC Take 1 tablet (325 mg total) by mouth daily at 12 noon. 04/04/23  Yes Vonna Drafts, MD  fluconazole (DIFLUCAN) 200 MG tablet Take 0.5 tablets (100 mg total) by mouth daily. 04/24/23  Yes Alwyn Ren, MD  folic acid  (FOLVITE) 1 MG tablet TAKE 1 TABLET (1 MG TOTAL) BY MOUTH DAILY (AM) Patient taking differently: Take 1 mg by mouth daily. 04/04/23  Yes Vonna Drafts, MD  lisinopril (ZESTRIL) 5 MG tablet Take 1 tablet (5 mg total) by mouth at bedtime. 05/22/23  Yes Hindel, Leah, MD  Multiple Vitamin (MULTIVITAMIN WITH MINERALS) TABS tablet Take 1 tablet by mouth daily. 04/04/23  Yes Vonna Drafts, MD  naltrexone (DEPADE) 50 MG tablet TAKE 1/2 TABLET (25 MG TOTAL) BY MOUTH DAILY (AM) Patient taking differently: Take 25 mg by mouth daily. 04/04/23  Yes Vonna Drafts, MD  OLANZapine (ZYPREXA) 5 MG tablet Take 1 tablet (5 mg total) by mouth at bedtime. Future refills will need to come from psychiatry. 04/04/23  Yes Vonna Drafts, MD  pantoprazole (PROTONIX) 40 MG tablet TAKE 1 TABLET (40 MG TOTAL) BY MOUTH DAILY (AM) Patient taking differently: Take 40 mg by mouth daily. 04/04/23  Yes Mahmood, Unknown Foley, MD  polyethylene glycol (MIRALAX / GLYCOLAX) 17 g packet MIX AND DRINK BY MOUTH DAILY AS NEEDED FOR MODERATE CONSTIPATION 04/04/23  Yes Vonna Drafts, MD  VENTOLIN HFA 108 (90 Base) MCG/ACT inhaler Inhale 1-2 puffs into the lungs every 6 (six) hours as needed for wheezing or shortness of breath. 04/04/23  Yes Vonna Drafts, MD  feeding supplement (ENSURE ENLIVE / ENSURE PLUS) LIQD Take 237 mLs by mouth 2 (two) times daily between meals. Patient not taking: Reported on 05/25/2023 04/24/23   Alwyn Ren, MD  fluticasone [facility]) 50 MCG/ACT nasal spray Place 2 sprays into both nostrils daily. Patient not taking: Reported on 05/25/2023 04/04/23   Vonna Drafts, MD  hydrocortisone (ANUSOL-HC) 2.5 % rectal cream Place 1 Application rectally 2 (two) times daily. Patient not taking: Reported on 05/25/2023 04/04/23   Vonna Drafts, MD  mometasone-formoterol [employer]) 100-5 MCG/ACT AERO INHALE TWO PUFFS INTO THE LUNGS TWICE A DAY Patient not taking: Reported on 05/25/2023 04/04/23   Vonna Drafts, MD  Pedialyte (PEDIALYTE) SOLN Take 240  mLs by mouth every 6 (six) hours. Patient not taking: Reported on 05/25/2023 04/23/23   Alwyn Ren, MD    Inpatient medications:  arformoterol  15 mcg Nebulization BID   budesonide (PULMICORT) nebulizer solution  0.5 mg Nebulization BID   Chlorhexidine Gluconate Cloth  6 each Topical Daily   docusate  100 mg Per Tube BID   insulin aspart  0-9 Units Subcutaneous Q4H   levETIRAcetam  500 mg Per Tube BID   mouth rinse  15 mL Mouth Rinse Q2H   pantoprazole (PROTONIX) IV  40 mg Intravenous Q12H   polyethylene glycol  17 g Per Tube Daily   sodium chloride flush  10-40 mL Intracatheter Q12H   thiamine  100 mg Per Tube Daily   Social History:  reports that he has been smoking cigarettes. He started smoking about 15 years ago.  He has a 7.9 pack-year smoking history. He has never used smokeless tobacco. He reports current alcohol use of about 42.0 standard drinks of alcohol per week. He reports current drug use. Frequency: 7.00 times per week. Drug: Marijuana.  Family History:   Family History  Problem Relation Age of Onset   Hypertension Mother    Cancer Maternal Grandfather        doesn't know the type    Weight change: -0.7 kg  Intake/Output Summary (Last 24 hours) at 05/29/2023 1257 Last data filed at 05/29/2023 0700 Gross per 24 hour  Intake 1595.08 ml  Output 355 ml  Net 1240.08 ml   BP 93/79   Pulse 100   Temp (!) 97.2 F (36.2 C)   Resp 19   Wt 81.6 kg   SpO2 100%   BMI 29.04 kg/m  Vitals:   05/29/23 1115 05/29/23 1125 05/29/23 1130 05/29/23 1145  BP:      Pulse: 96  98 100  Resp: 17  (!) 22 19  Temp: (!) 96.8 F (36 C)  (!) 97 F (36.1 C) (!) 97.2 F (36.2 C)  TempSrc:      SpO2: 100% 100% 100% 100%  Weight:         Intubated and sedated Tachycardic, rhythm regular Ventilated 40% PEEP 8, lungs clear Trace peripheral edema Unresponsive, RASS -4  Labs: Basic Metabolic Panel: Recent Labs  Lab 05/25/23 1404 05/25/23 1414 05/25/23 1429  05/26/23 0507 05/26/23 1401 05/27/23 0408 05/27/23 1503 05/27/23 1514 05/27/23 1722 05/27/23 1925 05/27/23 1927 05/28/23 0020 05/28/23 0302 05/28/23 0336 05/28/23 0616 05/28/23 1648 05/29/23 0226 05/29/23 0309  NA 140 136   < > 134*  --  131*   < > 137   < > 135   < > 132* 133* 131* 132* 132* 135 133*  K 4.5 4.4   < > 2.7*  --  3.6   < > 4.1   < > 3.9   < > 4.3 4.4 4.2 4.2 3.9 3.5 3.6  CL 101 104  --  93*  --  84*  --  83*  --  85*  --   --  83*  --   --   --  88*  --   CO2 8*  --   --  12*  --  34*  --  27  --  30  --   --  28  --   --   --  26  --   GLUCOSE 144* 130*  --  431*  --  166*  --  112*  --  131*  --   --  108*  --   --   --  130*  --   BUN 11 12  --  13  --  16  --  18  --  16  --   --  18  --   --   --  19  --   CREATININE 2.38* 2.00*  --  2.80*  --  3.03*  --  3.27*  --  2.83*  --   --  3.30*  --   --   --  3.80*  --   ALBUMIN 2.1*  --   --   --  1.9* 1.9*  --   --   --   --   --   --  3.5  --   --   --  2.9*  --   CALCIUM 7.2*  --   --  6.3*  --  5.9*  --  6.6*  --  7.8*  --   --  7.3*  --   --   --  8.0*  --   PHOS  --   --   --  7.7*  --   --   --   --   --   --   --   --   --   --   --   --  6.2*  --    < > = values in this interval not displayed.   Liver Function Tests: Recent Labs  Lab 05/27/23 0408 05/28/23 0302 05/29/23 0226 05/29/23 0927  AST 2,062* 603* 600*  --   ALT 543* 244* 254*  --   ALKPHOS 23* 48 74  --   BILITOT 1.3* 3.2* 3.9* 3.6*  PROT 3.6* 5.9* 5.7*  --   ALBUMIN 1.9* 3.5 2.9*  --    No results for input(s): "LIPASE", "AMYLASE" in the last 168 hours. Recent Labs  Lab 05/27/23 1514  AMMONIA 34   CBC: Recent Labs  Lab 05/25/23 1404 05/25/23 1414 05/26/23 1401 05/26/23 1404 05/27/23 1728 05/27/23 1917 05/27/23 1927 05/28/23 0302 05/28/23 0336 05/28/23 1053 05/28/23 1648 05/29/23 0226 05/29/23 0309  WBC 8.0   < > 5.5   < > 5.0  --   --  6.8  --  8.6  --  8.1  --   NEUTROABS 4.8  --  3.1  --  3.2  --   --   --   --  6.8   --   --   --   HGB 11.3*   < > 6.4*   < > 10.0* 9.5*   < > 10.0*   < > 9.8* 9.9* 9.4* 9.5*  HCT 37.3*   < > 18.8*   < > 29.0* 27.5*   < > 28.6*   < > 27.9* 29.0* 26.9* 28.0*  MCV 109.4*   < > 90.4   < > 81.2  --   --  79.9*  --  81.1  --  82.8  --   PLT 122*   < > 32*   < > 44*  43* 59*  --  48*  46*  --  46*  --  30*  32*  --    < > = values in this interval not displayed.   PT/INR: @LABRCNTIP (inr:5) Cardiac Enzymes: ) Recent Labs  Lab 05/25/23 1532 05/26/23 1401 05/29/23 0927  CKTOTAL 237 3,654* 1,183*   CBG: Recent Labs  Lab 05/28/23 1956 05/28/23 2325 05/29/23 0310 05/29/23 0805 05/29/23 1129  GLUCAP 126* 123* 146* 93 92    Iron Studies:  Recent Labs  Lab 05/27/23 1917  FERRITIN 2,399*    Xrays/Other Studies: VAS [place] LOWER EXTREMITY VENOUS (DVT)  Result Date: 05/29/2023  Lower Venous DVT Study Patient Name:  Masiah JAELAN FORYS  Date of Exam:   05/29/2023 Medical Rec #: [id number]      Accession #:    [id number] Date of Birth: 06-May-1991      Patient Gender: M Patient Age:   56 years Exam Location:  [facility] Procedure:      VAS [place] LOWER EXTREMITY VENOUS (DVT) Referring Phys: MURALI RAMASWAMY --------------------------------------------------------------------------------  Indications: Edema, and DIC.  Risk Factors: Patient on ventilator and 3 pressors. Limitations: Line and Significant lower extremity edema, postioning, Foley. Comparison Study: No prior study on file Performing Technologist: Sherren Kerns RVS  Examination Guidelines: A complete  evaluation includes B-mode imaging, spectral Doppler, color Doppler, and power Doppler as needed of all accessible portions of each vessel. Bilateral testing is considered an integral part of a complete examination. Limited examinations for reoccurring indications may be performed as noted. The reflux portion of the exam is performed with the patient in reverse Trendelenburg.   +---------+---------------+---------+-----------+----------+-------------------+ RIGHT    CompressibilityPhasicitySpontaneityPropertiesThrombus Aging      +---------+---------------+---------+-----------+----------+-------------------+ CFV                     Yes      Yes                  patent by color and                                                       Doppler             +---------+---------------+---------+-----------+----------+-------------------+ SFJ                     Yes      Yes                  patent by color and                                                       Doppler             +---------+---------------+---------+-----------+----------+-------------------+ FV Prox                                               Not well visualized +---------+---------------+---------+-----------+----------+-------------------+ FV Mid                                                Not well visualized +---------+---------------+---------+-----------+----------+-------------------+ FV Distal                                             Not well visualized +---------+---------------+---------+-----------+----------+-------------------+ PFV                                                   Not well visualized +---------+---------------+---------+-----------+----------+-------------------+ POP      Full           Yes      Yes                                      +---------+---------------+---------+-----------+----------+-------------------+ PTV  patent by color and                                                       Doppler             +---------+---------------+---------+-----------+----------+-------------------+ PERO     None           No       No                   Acute               +---------+---------------+---------+-----------+----------+-------------------+   +---------+---------------+---------+-----------+----------+-------------------+ LEFT     CompressibilityPhasicitySpontaneityPropertiesThrombus Aging      +---------+---------------+---------+-----------+----------+-------------------+ CFV      Full                                                             +---------+---------------+---------+-----------+----------+-------------------+ SFJ      Full                                                             +---------+---------------+---------+-----------+----------+-------------------+ FV Prox  Full           Yes      Yes                                      +---------+---------------+---------+-----------+----------+-------------------+ FV Mid                                                Not well visualized +---------+---------------+---------+-----------+----------+-------------------+ FV Distal                                             Not well visualized +---------+---------------+---------+-----------+----------+-------------------+ PFV                                                   Not well visualized +---------+---------------+---------+-----------+----------+-------------------+ POP                     Yes      Yes                  patent by color and  Doppler             +---------+---------------+---------+-----------+----------+-------------------+ PTV                                                   patent by color and                                                       Doppler             +---------+---------------+---------+-----------+----------+-------------------+ PERO                                                  Not well visualized +---------+---------------+---------+-----------+----------+-------------------+     Summary: RIGHT: - Findings consistent with acute deep vein thrombosis involving  the right peroneal veins.  - No cystic structure found in the popliteal fossa.  LEFT: - There is no evidence of deep vein thrombosis in the lower extremity. However, portions of this examination were limited- see technologist comments above.  *See table(s) above for measurements and observations. Electronically signed by Sherald Hess MD on 05/29/2023 at 12:56:39 PM.    Final    ECHOCARDIOGRAM LIMITED  Result Date: 05/28/2023    ECHOCARDIOGRAM LIMITED REPORT   Patient Name:   Abir SHAMIR CHACKO Date of Exam: 05/28/2023 Medical Rec #:  [id number]     Height:       66.0 in Accession #:    [id number]    Weight:       181.4 lb Date of Birth:  09-01-1991     BSA:          1.919 m Patient Age:    32 years      BP:           110/77 mmHg Patient Gender: M             HR:           95 bpm. Exam Location:  Inpatient Procedure: Limited Echo, Color Doppler and Cardiac Doppler Indications:    Cardiac Arrest i46.9  History:        Patient has prior history of Echocardiogram examinations, most                 recent 05/26/2023. Risk Factors:Hypertension and ETOH.  Sonographer:    Irving Burton Senior RDCS Referring Phys: [phone] [facility]  Sonographer Comments: Scanned upright on artificial respirator. IMPRESSIONS  1. Limited study.  2. Left ventricular ejection fraction, by estimation, is 20 to 25%. The left ventricle has severely decreased function. The left ventricle demonstrates global hypokinesis.  3. RV-RA gradient 21 mmHg suggesting normal to mildly increased estimated RVSP depending on CVP. Right ventricular systolic function is severely reduced. The right ventricular size is moderately enlarged.  4. The mitral valve is grossly normal. Mild mitral valve regurgitation.  5. The tricuspid valve is abnormal. Tricuspid valve regurgitation is moderate to severe.  6. The aortic valve is tricuspid.  7. Unable to estimate CVP. Comparison(s): Prior images reviewed side by side. LVEF 20-25% with global hypokinesis and decrease  compared  with prior study. FINDINGS  Left Ventricle: Left ventricular ejection fraction, by estimation, is 20 to 25%. The left ventricle has severely decreased function. The left ventricle demonstrates global hypokinesis. Right Ventricle: RV-RA gradient 21 mmHg suggesting normal to mildly increased estimated RVSP depending on CVP. The right ventricular size is moderately enlarged. Right ventricular systolic function is severely reduced. Mitral Valve: The mitral valve is grossly normal. Mild mitral valve regurgitation. Tricuspid Valve: The tricuspid valve is abnormal. Tricuspid valve regurgitation is moderate to severe. Aortic Valve: The aortic valve is tricuspid. Venous: Unable to estimate CVP. IVC assessment for right atrial pressure unable to be performed due to mechanical ventilation. Additional Comments: Spectral Doppler performed. Color Doppler performed.   LV Volumes (MOD) LV vol d, MOD A2C: 77.8 ml LV vol d, MOD A4C: 61.5 ml LV vol s, MOD A2C: 59.8 ml LV vol s, MOD A4C: 47.9 ml LV SV MOD A2C:     18.0 ml LV SV MOD A4C:     61.5 ml LV SV MOD BP:      16.1 ml RIGHT VENTRICLE RV S prime:     6.09 cm/s TAPSE (M-mode): 0.9 cm TRICUSPID VALVE TR Peak grad:   20.8 mmHg TR Vmax:        228.00 cm/s Nona Dell MD Electronically signed by Nona Dell MD Signature Date/Time: 05/28/2023/2:42:08 PM    Final    DG Chest Port 1 View  Result Date: 05/28/2023 CLINICAL DATA:  32 year old male status post intubation. EXAM: PORTABLE CHEST 1 VIEW COMPARISON:  Chest x-ray 05/27/2023. FINDINGS: An endotracheal tube is in place with tip 1.5 cm above the carina. Nasogastric tube extending into the body of the stomach. Transcutaneous defibrillator pad projecting over the lower left hemithorax. Lung volumes are low. Large right and small left pleural effusions. Opacities throughout the right lung and in the left lung base may reflect areas of atelectasis and/or consolidation. No pneumothorax. No evidence of pulmonary edema. Heart  size is normal. Mediastinal contours are distorted by patient positioning. IMPRESSION: 1. Support apparatus, as above. 2. Worsening aeration with enlarging large right pleural effusion, small left pleural effusion and areas of atelectasis and/or consolidation throughout the lungs bilaterally (right greater than left). Electronically Signed   By: Trudie Reed M.D.   On: 05/28/2023 08:05   Overnight EEG with video  Result Date: 05/28/2023 Charlsie Quest, MD     05/29/2023  7:15 AM Patient Name: DIVONTE FITZMORRIS MRN: [id number] Epilepsy Attending: Charlsie Quest Referring Physician/Provider: Lorin Glass, MD Duration: 05/27/2023 2237 to 05/28/2023 2237  Patient history: 32 year old male with history of seizures, hypertension and asthma who presented after had witnessed seizure. EEG to evaluate for seizure  Level of alertness: comatose  AEDs during EEG study: LEV, versed, propofol  Technical aspects: This EEG study was done with scalp electrodes positioned according to the 10-20 International system of electrode placement. Electrical activity was reviewed with band pass filter of 1-70Hz , sensitivity of 7 uV/mm, display speed of 16mm/sec with a 60Hz  notched filter applied as appropriate. EEG data were recorded continuously and digitally stored.  Video monitoring was available and reviewed as appropriate.  Description: At the beginning of the study, EEG showed burst suppression pattern with bursts if 3-6zh theta-delta slowing lasting 4-6 seconds alternating with 3-5 seconds of generalized suppression. Gradually, EEG showed continuous generalized 3 to 6 Hz theta-delta slowing. Hyperventilation and photic stimulation were not performed.    ABNORMALITY - Continuous slow, generalized  IMPRESSION: This study  was initially suggestive of profound diffuse encephalopathy which gradually improved to severe diffuse encephalopathy, likely related to sedation. No seizures or epileptiform discharges were seen throughout the  recording.  Priyanka [name]   CT Angio Abd/Pel w/ and/or w/o  Result Date: 05/27/2023 CLINICAL DATA:  Retroperitoneal bleed suspected. Check for bleeding source. EXAM: CTA ABDOMEN AND PELVIS WITHOUT AND WITH CONTRAST TECHNIQUE: Multidetector CT imaging of the abdomen and pelvis was performed using the standard protocol during bolus administration of intravenous contrast. Multiplanar reconstructed images and MIPs were obtained and reviewed to evaluate the vascular anatomy. RADIATION DOSE REDUCTION: This exam was performed according to the departmental dose-optimization program which includes automated exposure control, adjustment of the mA and/or kV according to patient size and/or use of iterative reconstruction technique. CONTRAST:  75mL OMNIPAQUE IOHEXOL 350 MG/ML SOLN COMPARISON:  Recent CT with IV contrast 05/25/2023. FINDINGS: VASCULAR Aorta: Normal caliber aorta without aneurysm, dissection, vasculitis or significant stenosis. There are mild scattered infrarenal calcific plaques. Celiac: Small in caliber most likely due to pressor medication(s), but widely patent. No branch occlusion. SMA: Small in caliber most likely due to pressor medication, but widely patent. No branch occlusion. Renals: Both are single. Both are widely patent but with small caliber branch vessels likely due to pressor affect. No branch occlusion. IMA: Small in caliber most likely due to pressor effect, but patent with no branch occlusions. Inflow: Small caliber inflow vessels but no focal atherosclerotic disease, focal stenosis, aneurysm or dissection. Findings likely due to pressor affect. Proximal Outflow: Bilateral common femoral and visualized portions of the superficial and profunda femoral arteries are patent without evidence of aneurysm, dissection, vasculitis or significant stenosis. The visualized outflow arteries small in caliber most likely due to pressor effect. Veins: Interval insertion of a right femoral central line  with the tip in the mid right external iliac vein. The portal vein, SMV and splenic vein are clear, with mild prominence of the main portal vein measuring 15 mm. The systemic veins are clear at least through the common iliac veins. Below the common iliac bifurcations the veins are unopacified. Arterial or venous extravasation: None visible throughout. Review of the MIP images confirms the above findings. NON-VASCULAR Lower chest: Since 05/25/2023 there is interval new finding of a moderate right and small left layering pleural effusions with increased dense consolidation in both adjacent lower lobes, more so on the right. There is patchy increased ground-glass infiltrate in the anterior lung bases consistent with pneumonia or edema. Subareolar gynecomastia asymmetrically on the right. The cardiac size is normal. No pericardial effusion. Hepatobiliary: Moderately steatotic liver measuring 18.5 cm length. No mass enhancement. Vicariously excreted contrast from the prior CT noted in the gallbladder. No wall thickening, stones or biliary dilatation. Pancreas: No focal abnormality or ductal dilatation. Spleen: No abnormality. Adrenals/Urinary Tract: No adrenal mass. No urinary stone or obstruction. No mass enhancement in either kidney. There is a 1 cm Bosniak type 1 cyst in the upper pole of the right kidney anteriorly, Hounsfield density is 16. No follow-up imaging is recommended. Interval removal of prior bladder catheter. The bladder is nondistended and filled with contrast. Stomach/Bowel: NGT passes into the stomach, and loops around upward to the left with the tip in the proximal lumen. The gastric wall is contracted. The small bowel is normal caliber and again demonstrates diffuse thickened folds. The appendix not as well seen as previously due to sizable abdominopelvic fluid volume believed most likely hemorrhage but appears normal in caliber where seen. No focal  abnormality in the large bowel wall. Lymphatic: No  adenopathy. Reproductive: No prostatomegaly. Both testicles are in the scrotal sac. Other: Diffuse worsening body wall anasarca, probable fluid overload. There previously was mild low-density abdominopelvic free ascites, but there is now a moderate-to-large volume now seen displacing the bowel inward spread diffusely throughout the abdomen and pelvis and scattered within the mesenteric folds, to a lesser extent surrounding the liver and spleen. The density of the fluid is 42-46 Hounsfield units worrisome for abdominopelvic diffuse hemorrhage. No free air is seen. No visible bleeding source. No abdominal wall hernia or abnormality. Musculoskeletal: No acute or significant osseous findings. IMPRESSION: 1. Small caliber inflow/outflow vessels and visceral arteries most likely due to pressor effect. No focal stenosis, aneurysm or dissection. 2. No visible vascular leak. 3. Interval development of moderate right and small left layering pleural effusions with increased dense consolidation in both lower lobes, more so on the right, and patchy ground-glass infiltrate in the anterior lung bases consistent with pneumonia or edema. 4. Diffuse worsening body wall anasarca, probable fluid overload. 5. Interval development of a moderate-to-large volume of fluid in the abdomen and pelvis with density of 42-46 Hounsfield units, worrisome for diffuse abdominopelvic hemorrhage. No visible bleeding source. 6. Diffusely thickened small bowel folds, most likely due to edema related to fluid overload. Enteritis not excluded. Ischemic etiology not strictly excluded but there is no pneumatosis or portal venous gas. 7. Moderate hepatic steatosis. 8. NGT passes into the stomach, loops around upward to the left with the tip in the proximal lumen. 9. Interval insertion of a right femoral central line with the tip in the mid right external iliac vein. 10. Subareolar gynecomastia asymmetrically on the right. 11. Critical Value/emergent results  were called by telephone at the time of interpretation on 05/27/2023 at 9:14 pm to provider DR. [facility], Who verbally acknowledged these results. Electronically Signed   By: Almira Bar M.D.   On: 05/27/2023 21:40   CT HEAD WO CONTRAST ( )  Result Date: 05/27/2023 CLINICAL DATA:  Delirium EXAM: CT HEAD WITHOUT CONTRAST TECHNIQUE: Contiguous axial images were obtained from the base of the skull through the vertex without intravenous contrast. RADIATION DOSE REDUCTION: This exam was performed according to the departmental dose-optimization program which includes automated exposure control, adjustment of the mA and/or kV according to patient size and/or use of iterative reconstruction technique. COMPARISON:  05/25/2023 CTA head and neck FINDINGS: Brain: No evidence of acute infarction, hemorrhage, mass, mass effect, or midline shift. No hydrocephalus or extra-axial fluid collection. Vascular: No hyperdense vessel. Skull: Negative for fracture or focal lesion. Sinuses/Orbits: Mucosal thickening throughout the paranasal sinuses, with air-fluid level in the left sphenoid sinus. Fluid in the nasopharynx is likely related to intubation. No acute finding in the orbits. Other: The mastoid air cells are well aerated. IMPRESSION: No acute intracranial process. Electronically Signed   By: Wiliam Ke M.D.   On: 05/27/2023 20:17   DG Chest Port 1 View  Result Date: 05/27/2023 CLINICAL DATA:  [id number] Pneumothorax [id number] EXAM: PORTABLE CHEST 1 VIEW COMPARISON:  May 27, 2023 FINDINGS: The cardiomediastinal silhouette is unchanged in contour.ETT tip terminates 9 mm above the carina. Enteric tube tip and side port project over the stomach. RIGHT IJ CVC tip terminates over the SVC. Small RIGHT pleural effusion and trace LEFT pleural effusion. No significant pneumothorax. Patchy heterogeneous basilar predominant opacities, not significantly changed in comparison to prior. IMPRESSION: 1. Support apparatus as described  above. ETT tip terminates 9 mm above the  carina. 2. Small RIGHT and trace LEFT pleural effusions. 3. Similar appearance of patchy heterogeneous basilar predominant opacities. This may reflect neurologic pulmonary edema with atelectasis but the differential considerations include aspiration or infection. Electronically Signed   By: Meda Klinefelter M.D.   On: 05/27/2023 18:00   DG CHEST PORT 1 VIEW  Result Date: 05/27/2023 CLINICAL DATA:  Endotracheal tube and OG tube. EXAM: PORTABLE CHEST 1 VIEW COMPARISON:  Chest x-ray 05/25/2023 FINDINGS: Endotracheal tube tip is 1.5 cm above the carina. Orogastric tube tip is in the gastric body/fundus. Right-sided central venous catheter tip projects over the SVC. The cardiomediastinal silhouette is stable and within normal limits. There are new patchy airspace opacities in both lower lungs, right greater than left. There is a new small right pleural effusion. There is no pneumothorax or acute fracture. IMPRESSION: 1. New patchy airspace opacities in both lower lungs, right greater than left, concerning for pneumonia. 2. New small right pleural effusion. 3. Endotracheal tube tip 1.5 cm above the carina. Electronically Signed   By: Darliss Cheney M.D.   On: 05/27/2023 15:27     Assessment/Plan:  AKI Likely ATN in setting of PEA arrest x 2. Rhabdomyolysis in setting of seizure contributing. Also considering renal vein thrombosis in setting DIC given urinalysis with blood and WBC. Query autoimmune issue in this young person with coagulopathy. Note serologies pending, will add C3 and C4 levels. AIN less likely. Creatinine trending up. BUN stable. Electrolytes okay. Continue following creatinine. Maintain perfusing MAP. No indication for RRT now but will continue to follow.  Hypervolemia Trace-1+ edema. Stable to improving vent settings over last 24 hours. Diuresis limited by shock state, currently.  Duplex revealed acute DVT involving the right peroneal veins.   Hematology being consulted.  Acidemia Improved after bicarbonate infusion and now stable.  Shock Stable of pressors over last 24 hours. Still tachycardic.  Sepsis due to aspiration pneumonia Antibiotics per MICU team.  DIC Complicated by venous thromboembolism and hemoperitoneum.  Marrianne Mood MD 05/29/2023, 12:57 PM    I have seen and examined this patient and developed and agree with plan and assessment in the above note with renal recommendations/intervention highlighted. Given his coagulopathy and persistent microscopic hematuria, will also send some GN studies not already ordered by PCCM including complement levels. Jomarie Longs A Laurine Kuyper,MD 05/29/2023 3:15 PM

## 2023-05-29 NOTE — Progress Notes (Signed)
Pharmacy Antibiotic Note  Timothy Hubbard is a 32 y.o. male admitted on 05/25/2023 with bacteremia.  Pharmacy has been consulted for Unasyn dosing.  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 WBC wnl, tmax 101.1. Will transition from zosyn to unasyn.   Plan: Start unasyn IV 3g q12h  Trend WBC, temperature, renal function, and cultures F/u DOT   Weight: 81.6 kg (179 lb 14.3 oz)  Temp (24hrs), Avg:99.2 F (37.3 C), Min:96.4 F (35.8 C), Max:101.3 F (38.5 C)  Recent Labs  Lab 05/26/23 0128 05/26/23 0507 05/26/23 1401 05/26/23 2316 05/27/23 0034 05/27/23 0408 05/27/23 1514 05/27/23 1728 05/27/23 1925 05/27/23 2122 05/28/23 0302 05/28/23 1053 05/29/23 0226  WBC  --    < > 5.5   < >  --  7.9  --  5.0  --   --  6.8 8.6 8.1  CREATININE  --    < >  --   --   --  3.03* 3.27*  --  2.83*  --  3.30*  --  3.80*  LATICACIDVEN >9.0*  --  >9.0*  --  7.3*  --  >9.0*  --   --  3.0*  --   --   --    < > = values in this interval not displayed.    Estimated Creatinine Clearance: 27.7 mL/min (A) (by C-G formula based on SCr of 3.8 mg/dL (H)).    Allergies  Allergen Reactions   Ibuprofen Anaphylaxis    Antimicrobials this admission: 9/19 Vanc x 1  9/19 Zosyn >> 9/23 9/23 Unaysn >>    Microbiology results: 9/19 Bcx: GPR, bifidobacterium  9/19: MRSA PCR neg  Thank you for allowing pharmacy to be a part of this patient's care.  Griffin Dakin 05/29/2023 2:59 PM

## 2023-05-29 NOTE — Progress Notes (Signed)
VASCULAR LAB    Bilateral lower extremity venous duplex has been performed.  See CV proc for preliminary results.   Arnett Galindez, RVT 05/29/2023, 10:30 AM

## 2023-05-29 NOTE — Plan of Care (Signed)
Pt progressing

## 2023-05-29 NOTE — Progress Notes (Signed)
       Subjective: No acute events.     Objective: Vital signs in last 24 hours: Temp:  [97.9 F (36.6 C)-101.3 F (38.5 C)] 97.9 F (36.6 C) (09/23 0700) Pulse Rate:  [20-181] 20 (09/23 0215) Resp:  [14-24] 18 (09/23 0700) BP: (80-123)/(59-89) 89/73 (09/23 0700) SpO2:  [87 %-100 %] 100 % (09/23 0735) Arterial Line BP: (94-143)/(64-99) 108/80 (09/23 0700) FiO2 (%):  [40 %-70 %] 40 % (09/23 0735) Weight:  [81.6 kg] 81.6 kg (09/23 0500) Last BM Date : 05/28/23  Intake/Output from previous day: 09/22 0701 - 09/23 0700 In: 1885.7 [I.V.:1506; NG/GT:90; IV Piggyback:289.7] Out: 355 [Urine:355] Intake/Output this shift: No intake/output data recorded.  PE: General: intubated, on vent Neuro: sedated Resp: ETT CV: RRR Abdomen: abdomen is very soft and nondistended    Lab Results:  Recent Labs    05/28/23 1053 05/28/23 1648 05/29/23 0226 05/29/23 0309  WBC 8.6  --  8.1  --   HGB 9.8*   < > 9.4* 9.5*  HCT 27.9*   < > 26.9* 28.0*  PLT 46*  --  30*  32*  --    < > = values in this interval not displayed.   BMET Recent Labs    05/28/23 0302 05/28/23 0336 05/29/23 0226 05/29/23 0309  NA 133*   < > 135 133*  K 4.4   < > 3.5 3.6  CL 83*  --  88*  --   CO2 28  --  26  --   GLUCOSE 108*  --  130*  --   BUN 18  --  19  --   CREATININE 3.30*  --  3.80*  --   CALCIUM 7.3*  --  8.0*  --    < > = values in this interval not displayed.   PT/INR Recent Labs    05/28/23 0302 05/29/23 0226  LABPROT 14.2 14.7  INR 1.1 1.1   CMP     Component Value Date/Time   NA 133 (L) 05/29/2023 0309   NA 139 05/22/2023 1213   K 3.6 05/29/2023 0309   CL 88 (L) 05/29/2023 0226   CO2 26 05/29/2023 0226   GLUCOSE 130 (H) 05/29/2023 0226   BUN 19 05/29/2023 0226   BUN 9 05/22/2023 1213   CREATININE 3.80 (H) 05/29/2023 0226   CREATININE 1.03 10/19/2016 1017   CALCIUM 8.0 (L) 05/29/2023 0226   PROT 5.7 (L) 05/29/2023 0226   PROT 5.8 (L) 02/13/2023 1725   ALBUMIN 2.9 (L)  05/29/2023 0226   ALBUMIN 3.5 (L) 02/13/2023 1725   AST 600 (H) 05/29/2023 0226   ALT 254 (H) 05/29/2023 0226   ALKPHOS 74 05/29/2023 0226   BILITOT 3.9 (H) 05/29/2023 0226   BILITOT <0.2 02/13/2023 1725   GFRNONAA 21 (L) 05/29/2023 0226   GFRNONAA >89 10/19/2016 1017   GFRAA 138 02/04/2020 1101   GFRAA >89 10/19/2016 1017   Lipase  No results found for: "LIPASE"    Assessment/Plan 32 yo male admitted with seizures and cardiac arrest, with spontaneous hemoperitoneum in the setting of DIC. CTA  did not show a source of active extravasation.  Stable, no role for surgery. Will sign off.    LOS: 4 days   Maudry Diego, MD, FACS, FSSO Surgical Oncology, General Surgery, Trauma and Critical [facility], [place] [phone] for weekday/non holidays Check [email or web address] for coverage night/weekend/holidays    05/29/23 10:29 AM

## 2023-05-29 NOTE — Progress Notes (Signed)
NAME:  Timothy Hubbard, MRN:  [id number], DOB:  [birth year], LOS: 4 ADMISSION DATE:  05/25/2023, CONSULTATION DATE:  05/25/2023 REFERRING MD:  EDP, CHIEF COMPLAINT:  Seizures and cardiac arrest    History of Present Illness:  Patient is a [name] w/ pertinent PMH seizures, asthma, htn, mood disorder presents to [facility] ED on 9/19 w/ seizure and cardiac arrest.   Per family patient compliant w/ seizure medications.  Had a witnessed seizure at home. EMS arrived and patient unresponsive. On route to ED patient became bradycardic 20-30 and pea arrested. 1 round cpr performed. Patient breathing spontaneously post arrest. Started on epi drip. On ED arrival patient altered but withdrew to painful stimuli. During intubation patient pea arrested and rosc after 1 round of cpr. EKG sinus tachy 130s. Patient remained hypotensive post intubation and cvl placed. Currently on 3 pressors. Temp 92 F and placed on bair hugger. LA 14. Given IV fluids and cultures obtained and placed on broad spectrum antibiotics. CXR w/ ett in good position; no infiltrate appreciated. Abg 6.88, 56, 379, 11. Given 2 amps of bicarb and ordered bicarb drip. CTA head, abd, pelvis ordered/pending. PCCM consulted for icu admission.  Pertinent  Medical History  past medical history of Acute encephalopathy (03/10/2022), Adenomatous polyp of descending colon, Asthma, Encephalopathy acute (03/01/2022), Hematochezia, HTN (hypertension), Hypokalemia (03/04/2022), Need for hepatitis C screening test (04/27/2022), and Seizures (HCC)  Significant Hospital Events: Including procedures, antibiotic start and stop dates in addition to other pertinent events   9/19 admit to [facility]; seizure and cardiac arrest 9/20 extubated 05/27/2023: PEA arrest and after woke up and was confused.  Total of 2 rounds of CPR with epi and bicarb x 1.  But became bradycardic > intubated.  Had A-line placed.  Also central line ascites tap did show blood.  Was given 4 units PRBC, 4 units  FFP and 2 units platelets and 1 unit of cryo major transfusion protocol Cc consult for hemoperitoneum.  DIC panel positive.  He had some oozing from mouth and IV sites EEG showed symmetric low voltage no/seizure activity  Interim History / Subjective:  No acute events overnight.  Patient evaluated bedside this morning.  Patient is intubated and sedated.  Not able to participate in any conversation.  Objective   Blood pressure 93/79, pulse (!) 107, temperature 98.8 F (37.1 C), resp. rate 20, weight 81.6 kg, SpO2 95%.    Vent Mode: PRVC FiO2 (%):  [40 %-60 %] 40 % Set Rate:  [14 bmp-20 bmp] 14 bmp Vt Set:  [470 mL] 470 mL PEEP:  [8 cmH20] 8 cmH20 Plateau Pressure:  [16 cmH20-28 cmH20] 16 cmH20   Intake/Output Summary (Last 24 hours) at 05/29/2023 1402 Last data filed at 05/29/2023 1300 Gross per 24 hour  Intake 1994.19 ml  Output 355 ml  Net 1639.19 ml   Filed Weights   05/25/23 1740 05/28/23 0407 05/29/23 0500  Weight: 58.6 kg 82.3 kg 81.6 kg    Examination: General: Intubated, sedated, acute distress HENT: Normocephalic, atraumatic, ET tube in place, OG tube in place Lungs: Decreased lung sounds bilaterally Cardiovascular: Regular rate and rhythm, no murmurs, rubs, or gallops Abdomen: Soft, nontender Extremities: Right lower extremity swelling appreciated.  Ischemic changes noted to bilateral upper and lower extremities Neuro: Intubated and sedated GU: Foley in place, minimal  Resolved Hospital Problem list   #Lactic acidosis  Assessment & Plan:  This is a 32 year old male with a past medical history of seizures, asthma, hypertension who presents  to the ED for concerns of seizure and had out-of-hospital cardiac arrest.   #Out-of-hospital cardiac arrest #Acute hypoxemic respiratory failure on mechanical ventilation Patient has been weaned off pressor support at this time.  Is still intubated.  Patient not able to withdraw to any painful stimuli.  Gag reflex and corneal  reflex intact.  Continue with current support. -VAP bundle -Patient is off of pressors at this time -Continue on medical medical ventilation -Can try to wean off sedation to wake patient up if patient can tolerate  #Sepsis Patient is still on Zosyn.  Unclear etiology at this time.  Will continue Zosyn.  Blood cultures with no growth at this time. -Continue Zosyn day 4 -Continue to follow cultures  #Seizure disorder Patient has a past medical history of seizure disorders.  Patient is currently on Keppra 500 mg twice daily patient was initially on Depakote, but due to elevated liver enzymes, patient was discontinued on Depakote.  Patient has not had any further seizures at this time. -Continue Keppra 500 mg twice daily -Neurology following -EEG long-term  #DIC, stable #Anemia of critical illness #Hemoperitoneum Looks like patient is improving.  Platelets are improving.  Hemoglobin stable.  Will follow-up on the labs.  D-dimer still elevated.  No schistocytes on peripheral smear.  Unclear why patient has this coagulopathy.  Could be autoimmune.  Will involve hematology today to see if we can further investigate why patient has this coagulopathy -Hemoglobin 9.4 today -PT 14.6 -INR 1.1 -APTT 30 -Consult hematology -Monitor for any further bleeding episodes -Consult levels pending -Follow-up coagulopathy labs  #HFrEF Echo showing reduced ejection fraction of 20 to 25%.  This could be likely secondary to stunned myocardium. -Need to hold GDMT in setting of acute kidney injury -Need to hold GDMT as hypertension will not allow as well  #Right pleural effusion Wonder patient will likely need a chest tube.  Will evaluate today.  Patient does have decreased breath sounds bilaterally. -Potentially will need chest tube if does not resolve  #Elevated bilirubin #Elevated liver enzymes Likely could be in the setting of shock liver.  Could be reactive as well.  Potentially could obtain right  upper quadrant ultrasound today.  -Obtain right upper quadrant ultrasound  #Acute renal failure Creatinine elevated to 3.80.  Patient has minimal urine output with 355 mL.  Might need to go on CRRT.  Will obtain CK today.  Has not been followed up in a couple days. -CK decreasing appropriately -Consult nephrology today -Acute renal failure likely in setting of rhabdo plus ATN  #Hypocalcemia Corrected calcium 8.9   Best Practice (right click and "Reselect all SmartList Selections" daily)   Diet/type: tubefeeds DVT prophylaxis: SCD GI prophylaxis: PPI Lines: Central line Foley:  Yes, and it is still needed Code Status:  full code  Labs   CBC: Recent Labs  Lab 05/25/23 1404 05/25/23 1414 05/26/23 1401 05/26/23 1404 05/27/23 0408 05/27/23 1503 05/27/23 1728 05/27/23 1917 05/27/23 1927 05/28/23 0302 05/28/23 0336 05/28/23 0616 05/28/23 1053 05/28/23 1648 05/29/23 0226 05/29/23 0309  WBC 8.0   < > 5.5   < > 7.9  --  5.0  --   --  6.8  --   --  8.6  --  8.1  --   NEUTROABS 4.8  --  3.1  --   --   --  3.2  --   --   --   --   --  6.8  --   --   --  HGB 11.3*   < > 6.4*   < > 6.3*   < > 10.0* 9.5*   < > 10.0*   < > 10.2* 9.8* 9.9* 9.4* 9.5*  HCT 37.3*   < > 18.8*   < > 18.1*   < > 29.0* 27.5*   < > 28.6*   < > 30.0* 27.9* 29.0* 26.9* 28.0*  MCV 109.4*   < > 90.4   < > 82.3  --  81.2  --   --  79.9*  --   --  81.1  --  82.8  --   PLT 122*   < > 32*   < > 19*  --  44*  43* 59*  --  48*  46*  --   --  46*  --  30*  32*  --    < > = values in this interval not displayed.    Basic Metabolic Panel: Recent Labs  Lab 05/25/23 1532 05/26/23 0454 05/26/23 0507 05/27/23 0408 05/27/23 1503 05/27/23 1514 05/27/23 1722 05/27/23 1925 05/27/23 1927 05/28/23 0302 05/28/23 0336 05/28/23 0616 05/28/23 1648 05/29/23 0226 05/29/23 0309  NA  --    < > 134* 131*   < > 137   < > 135   < > 133* 131* 132* 132* 135 133*  K  --    < > 2.7* 3.6   < > 4.1   < > 3.9   < > 4.4 4.2  4.2 3.9 3.5 3.6  CL  --   --  93* 84*  --  83*  --  85*  --  83*  --   --   --  88*  --   CO2  --   --  12* 34*  --  27  --  30  --  28  --   --   --  26  --   GLUCOSE  --   --  431* 166*  --  112*  --  131*  --  108*  --   --   --  130*  --   BUN  --   --  13 16  --  18  --  16  --  18  --   --   --  19  --   CREATININE  --   --  2.80* 3.03*  --  3.27*  --  2.83*  --  3.30*  --   --   --  3.80*  --   CALCIUM  --   --  6.3* 5.9*  --  6.6*  --  7.8*  --  7.3*  --   --   --  8.0*  --   MG 1.8  --  2.2  --   --  1.6*  --   --   --   --   --   --   --  1.9  --   PHOS  --   --  7.7*  --   --   --   --   --   --   --   --   --   --  6.2*  --    < > = values in this interval not displayed.   GFR: Estimated Creatinine Clearance: 27.7 mL/min (A) (by C-G formula based on SCr of 3.8 mg/dL (H)). Recent Labs  Lab 05/25/23 1532 05/26/23 0128 05/26/23 1401 05/26/23 2316 05/27/23 0034 05/27/23 0408 05/27/23 1514 05/27/23  1728 05/27/23 2122 05/28/23 0302 05/28/23 1053 05/29/23 0226  PROCALCITON 0.60  --   --   --   --   --   --   --   --   --   --   --   WBC  --    < > 5.5   < >  --    < >  --  5.0  --  6.8 8.6 8.1  LATICACIDVEN  --    < > >9.0*  --  7.3*  --  >9.0*  --  3.0*  --   --   --    < > = values in this interval not displayed.    Liver Function Tests: Recent Labs  Lab 05/25/23 1404 05/26/23 1401 05/27/23 0408 05/28/23 0302 05/29/23 0226 05/29/23 0927  AST 3,849* 5,642* 2,062* 603* 600*  --   ALT 1,659* 738* 543* 244* 254*  --   ALKPHOS 46 25* 23* 48 74  --   BILITOT 1.6* 1.2 1.3* 3.2* 3.9* 3.6*  PROT 4.1* 3.2* 3.6* 5.9* 5.7*  --   ALBUMIN 2.1* 1.9* 1.9* 3.5 2.9*  --    No results for input(s): "LIPASE", "AMYLASE" in the last 168 hours. Recent Labs  Lab 05/27/23 1514  AMMONIA 34    ABG    Component Value Date/Time   PHART 7.574 (H) 05/29/2023 0309   PCO2ART 33.7 05/29/2023 0309   PO2ART 96 05/29/2023 0309   HCO3 31.1 (H) 05/29/2023 0309   TCO2 32 05/29/2023 0309    ACIDBASEDEF 15.0 (H) 05/26/2023 0454   O2SAT 98 05/29/2023 0309     Coagulation Profile: Recent Labs  Lab 05/26/23 1404 05/27/23 1728 05/27/23 1917 05/28/23 0302 05/29/23 0226  INR 1.7* 1.3* 1.2 1.1 1.1    Cardiac Enzymes: Recent Labs  Lab 05/25/23 1532 05/26/23 1401 05/29/23 0927  CKTOTAL 237 3,654* 1,183*    HbA1C: Hgb A1c MFr Bld  Date/Time Value Ref Range Status  05/26/2023 02:01 PM 5.7 (H) 4.8 - 5.6 % Final    Comment:    (NOTE) Pre diabetes:          5.7%-6.4%  Diabetes:              >6.4%  Glycemic control for   <7.0% adults with diabetes   03/03/2022 08:42 AM <4.2 (L) 4.8 - 5.6 % Final    Comment:    (NOTE) **Verified by repeat analysis**         Prediabetes: 5.7 - 6.4         Diabetes: >6.4         Glycemic control for adults with diabetes: <7.0     CBG: Recent Labs  Lab 05/28/23 1956 05/28/23 2325 05/29/23 0310 05/29/23 0805 05/29/23 1129  GLUCAP 126* 123* 146* 93 92    Review of Systems:   Negative Except what is stated in HPI   Past Medical History:  He,  has a past medical history of Acute encephalopathy (03/10/2022), Adenomatous polyp of descending colon, Asthma, Encephalopathy acute (03/01/2022), Hematochezia, HTN (hypertension), Hypokalemia (03/04/2022), Need for hepatitis C screening test (04/27/2022), and Seizures (HCC).   Surgical History:   Past Surgical History:  Procedure Laterality Date   COLONOSCOPY WITH PROPOFOL N/A 03/14/2022   Procedure: COLONOSCOPY WITH PROPOFOL;  Surgeon: Shellia Cleverly, DO;  Location: [facility] ENDOSCOPY;  Service: Gastroenterology;  Laterality: N/A;   POLYPECTOMY  03/14/2022   Procedure: POLYPECTOMY;  Surgeon: Shellia Cleverly, DO;  Location: [facility] ENDOSCOPY;  Service:  Gastroenterology;;     Social History:   reports that he has been smoking cigarettes. He started smoking about 15 years ago. He has a 7.9 pack-year smoking history. He has never used smokeless tobacco. He reports current alcohol use  of about 42.0 standard drinks of alcohol per week. He reports current drug use. Frequency: 7.00 times per week. Drug: Marijuana.   Family History:  His family history includes Cancer in his maternal grandfather; Hypertension in his mother.   Allergies Allergies  Allergen Reactions   Ibuprofen Anaphylaxis     Home Medications  Prior to Admission medications   Medication Sig Start Date End Date Taking? Authorizing Provider  acetaminophen (TYLENOL) 325 MG tablet Take 2 tablets (650 mg total) by mouth every 6 (six) hours as needed for mild pain or moderate pain. 03/30/22  Yes Littie Deeds, MD  albuterol (PROVENTIL) (2.5 MG/3ML) 0.083% nebulizer solution Take 3 mLs (2.5 mg total) by nebulization every 6 (six) hours as needed for wheezing or shortness of breath. 04/04/23  Yes Vonna Drafts, MD  amLODipine (NORVASC) 10 MG tablet Take 1 tablet (10 mg total) by mouth at bedtime. 05/22/23  Yes Hindel, Leah, MD  divalproex (DEPAKOTE) 500 MG DR tablet Take 1 tablet (500 mg total) by mouth 2 (two) times daily. 04/04/23  Yes Vonna Drafts, MD  ferrous sulfate 324 (65 Fe) MG TBEC Take 1 tablet (325 mg total) by mouth daily at 12 noon. 04/04/23  Yes Vonna Drafts, MD  fluconazole (DIFLUCAN) 200 MG tablet Take 0.5 tablets (100 mg total) by mouth daily. 04/24/23  Yes Alwyn Ren, MD  folic acid (FOLVITE) 1 MG tablet TAKE 1 TABLET (1 MG TOTAL) BY MOUTH DAILY (AM) Patient taking differently: Take 1 mg by mouth daily. 04/04/23  Yes Vonna Drafts, MD  lisinopril (ZESTRIL) 5 MG tablet Take 1 tablet (5 mg total) by mouth at bedtime. 05/22/23  Yes Hindel, Leah, MD  Multiple Vitamin (MULTIVITAMIN WITH MINERALS) TABS tablet Take 1 tablet by mouth daily. 04/04/23  Yes Vonna Drafts, MD  naltrexone (DEPADE) 50 MG tablet TAKE 1/2 TABLET (25 MG TOTAL) BY MOUTH DAILY (AM) Patient taking differently: Take 25 mg by mouth daily. 04/04/23  Yes Vonna Drafts, MD  OLANZapine (ZYPREXA) 5 MG tablet Take 1 tablet (5 mg total) by  mouth at bedtime. Future refills will need to come from psychiatry. 04/04/23  Yes Vonna Drafts, MD  pantoprazole (PROTONIX) 40 MG tablet TAKE 1 TABLET (40 MG TOTAL) BY MOUTH DAILY (AM) Patient taking differently: Take 40 mg by mouth daily. 04/04/23  Yes Mahmood, Unknown Foley, MD  polyethylene glycol (MIRALAX / GLYCOLAX) 17 g packet MIX AND DRINK BY MOUTH DAILY AS NEEDED FOR MODERATE CONSTIPATION 04/04/23  Yes Vonna Drafts, MD  VENTOLIN HFA 108 (90 Base) MCG/ACT inhaler Inhale 1-2 puffs into the lungs every 6 (six) hours as needed for wheezing or shortness of breath. 04/04/23  Yes Vonna Drafts, MD  feeding supplement (ENSURE ENLIVE / ENSURE PLUS) LIQD Take 237 mLs by mouth 2 (two) times daily between meals. Patient not taking: Reported on 05/25/2023 04/24/23   Alwyn Ren, MD  fluticasone [facility]) 50 MCG/ACT nasal spray Place 2 sprays into both nostrils daily. Patient not taking: Reported on 05/25/2023 04/04/23   Vonna Drafts, MD  hydrocortisone (ANUSOL-HC) 2.5 % rectal cream Place 1 Application rectally 2 (two) times daily. Patient not taking: Reported on 05/25/2023 04/04/23   Vonna Drafts, MD  mometasone-formoterol (DULERA) 100-5 MCG/ACT AERO INHALE TWO PUFFS INTO THE LUNGS TWICE A  DAY Patient not taking: Reported on 05/25/2023 04/04/23   Vonna Drafts, MD  Pedialyte (PEDIALYTE) SOLN Take 240 mLs by mouth every 6 (six) hours. Patient not taking: Reported on 05/25/2023 04/23/23   Alwyn Ren, MD     Critical care time: 40 mins     Modena Slater, DO Internal Medicine Resident PGY-2 Pager: [phone]

## 2023-05-29 NOTE — Progress Notes (Signed)
eLink Physician-Brief Progress Note Patient Name: Timothy Hubbard DOB: [birth year] MRN: [id number]   Date of Service  05/29/2023  HPI/Events of Note  [name] w/ pertinent PMH seizures, asthma, htn, mood disorder presents to [facility] ED on 9/19 w/ seizure and cardiac arrest   Significant improvement in acidosis and off NE and epi. Stable Hg.  eICU Interventions  Reduce RR to 14  D/C Epi orders.      Intervention Category Minor Interventions: Routine modifications to care plan (e.g. PRN medications for pain, fever)  Timothy Hubbard 05/29/2023, 3:25 AM

## 2023-05-29 NOTE — Procedures (Signed)
Patient Name: Timothy Hubbard  MRN: [id number]  Epilepsy Attending: Charlsie Quest  Referring Physician/Provider: Lorin Glass, MD  Duration: 05/28/2023 2237 to 05/29/2023 2237   Patient history: 33 year old male with history of seizures, hypertension and asthma who presented after had witnessed seizure. EEG to evaluate for seizure   Level of alertness: comatose   AEDs during EEG study: LEV, propofol   Technical aspects: This EEG study was done with scalp electrodes positioned according to the 10-20 International system of electrode placement. Electrical activity was reviewed with band pass filter of 1-70Hz , sensitivity of 7 uV/mm, display speed of 36mm/sec with a 60Hz  notched filter applied as appropriate. EEG data were recorded continuously and digitally stored.  Video monitoring was available and reviewed as appropriate.   Description: EEG showed continuous generalized 3 to 6 Hz theta-delta slowing. Hyperventilation and photic stimulation were not performed.      ABNORMALITY - Continuous slow, generalized   IMPRESSION: This study was suggestive of severe diffuse encephalopathy, likely related to sedation. No seizures or epileptiform discharges were seen throughout the recording.   Deon Duer [name]

## 2023-05-30 DIAGNOSIS — R569 Unspecified convulsions: Secondary | ICD-10-CM | POA: Diagnosis not present

## 2023-05-30 DIAGNOSIS — E872 Acidosis, unspecified: Secondary | ICD-10-CM | POA: Diagnosis not present

## 2023-05-30 DIAGNOSIS — I469 Cardiac arrest, cause unspecified: Secondary | ICD-10-CM | POA: Diagnosis not present

## 2023-05-30 LAB — CBC
HCT: 26.8 % — ABNORMAL LOW (ref 39.0–52.0)
HCT: 27.5 % — ABNORMAL LOW (ref 39.0–52.0)
Hemoglobin: 9 g/dL — ABNORMAL LOW (ref 13.0–17.0)
Hemoglobin: 9.4 g/dL — ABNORMAL LOW (ref 13.0–17.0)
MCH: 28.8 pg (ref 26.0–34.0)
MCH: 29.2 pg (ref 26.0–34.0)
MCHC: 33.6 g/dL (ref 30.0–36.0)
MCHC: 34.2 g/dL (ref 30.0–36.0)
MCV: 85.4 fL (ref 80.0–100.0)
MCV: 85.6 fL (ref 80.0–100.0)
Platelets: 23 10*3/uL — CL (ref 150–400)
Platelets: 24 10*3/uL — CL (ref 150–400)
RBC: 3.13 MIL/uL — ABNORMAL LOW (ref 4.22–5.81)
RBC: 3.22 MIL/uL — ABNORMAL LOW (ref 4.22–5.81)
RDW: 15.9 % — ABNORMAL HIGH (ref 11.5–15.5)
RDW: 16.3 % — ABNORMAL HIGH (ref 11.5–15.5)
WBC: 6.5 10*3/uL (ref 4.0–10.5)
WBC: 7 10*3/uL (ref 4.0–10.5)
nRBC: 0.8 % — ABNORMAL HIGH (ref 0.0–0.2)
nRBC: 1.1 % — ABNORMAL HIGH (ref 0.0–0.2)

## 2023-05-30 LAB — COMPREHENSIVE METABOLIC PANEL
ALT: 203 U/L — ABNORMAL HIGH (ref 0–44)
AST: 211 U/L — ABNORMAL HIGH (ref 15–41)
Albumin: 2.6 g/dL — ABNORMAL LOW (ref 3.5–5.0)
Alkaline Phosphatase: 74 U/L (ref 38–126)
Anion gap: 20 — ABNORMAL HIGH (ref 5–15)
BUN: 23 mg/dL — ABNORMAL HIGH (ref 6–20)
CO2: 30 mmol/L (ref 22–32)
Calcium: 7.9 mg/dL — ABNORMAL LOW (ref 8.9–10.3)
Chloride: 86 mmol/L — ABNORMAL LOW (ref 98–111)
Creatinine, Ser: 4.31 mg/dL — ABNORMAL HIGH (ref 0.61–1.24)
GFR, Estimated: 18 mL/min — ABNORMAL LOW (ref >60)
Glucose, Bld: 115 mg/dL — ABNORMAL HIGH (ref 70–99)
Potassium: 3.6 mmol/L (ref 3.5–5.1)
Sodium: 136 mmol/L (ref 135–145)
Total Bilirubin: 2.6 mg/dL — ABNORMAL HIGH (ref 0.3–1.2)
Total Protein: 5.5 g/dL — ABNORMAL LOW (ref 6.5–8.1)

## 2023-05-30 LAB — CARDIOLIPIN ANTIBODIES, IGG, IGM, IGA
Anticardiolipin IgA: <9 APL U/mL (ref 0–11)
Anticardiolipin IgG: <9 GPL U/mL (ref 0–14)
Anticardiolipin IgM: <9 MPL U/mL (ref 0–12)

## 2023-05-30 LAB — GLUCOSE, CAPILLARY
Glucose-Capillary: 115 mg/dL — ABNORMAL HIGH (ref 70–99)
Glucose-Capillary: 108 mg/dL — ABNORMAL HIGH (ref 70–99)
Glucose-Capillary: 107 mg/dL — ABNORMAL HIGH (ref 70–99)
Glucose-Capillary: 100 mg/dL — ABNORMAL HIGH (ref 70–99)
Glucose-Capillary: 93 mg/dL (ref 70–99)
Glucose-Capillary: 82 mg/dL (ref 70–99)

## 2023-05-30 LAB — BASIC METABOLIC PANEL
Anion gap: 17 — ABNORMAL HIGH (ref 5–15)
BUN: 25 mg/dL — ABNORMAL HIGH (ref 6–20)
CO2: 29 mmol/L (ref 22–32)
Calcium: 8 mg/dL — ABNORMAL LOW (ref 8.9–10.3)
Chloride: 90 mmol/L — ABNORMAL LOW (ref 98–111)
Creatinine, Ser: 4.38 mg/dL — ABNORMAL HIGH (ref 0.61–1.24)
GFR, Estimated: 17 mL/min — ABNORMAL LOW (ref >60)
Glucose, Bld: 111 mg/dL — ABNORMAL HIGH (ref 70–99)
Potassium: 3.5 mmol/L (ref 3.5–5.1)
Sodium: 136 mmol/L (ref 135–145)

## 2023-05-30 LAB — ADAMTS13 ANTIBODY: ADAMTS13 Antibody: 3 Units/mL (ref <12)

## 2023-05-30 LAB — DIC (DISSEMINATED INTRAVASCULAR COAGULATION)PANEL
D-Dimer, Quant: >20 ug/mL-FEU — ABNORMAL HIGH (ref 0.00–0.50)
Fibrinogen: 484 mg/dL — ABNORMAL HIGH (ref 210–475)
INR: 1.1 (ref 0.8–1.2)
Platelets: 20 10*3/uL — CL (ref 150–400)
Prothrombin Time: 14.4 seconds (ref 11.4–15.2)
Smear Review: NONE SEEN
aPTT: 51 seconds — ABNORMAL HIGH (ref 24–36)

## 2023-05-30 LAB — RHEUMATOID FACTOR: Rheumatoid fact SerPl-aCnc: 11.8 IU/mL (ref <14.0)

## 2023-05-30 LAB — MAGNESIUM
Magnesium: 2.2 mg/dL (ref 1.7–2.4)
Magnesium: 2.2 mg/dL (ref 1.7–2.4)

## 2023-05-30 LAB — CULTURE, BLOOD (ROUTINE X 2)
Culture: NO GROWTH
Special Requests: ADEQUATE

## 2023-05-30 LAB — PHOSPHORUS
Phosphorus: 4.5 mg/dL (ref 2.5–4.6)
Phosphorus: 4.4 mg/dL (ref 2.5–4.6)

## 2023-05-30 LAB — HAPTOGLOBIN: Haptoglobin: 81 mg/dL (ref 17–317)

## 2023-05-30 LAB — HEPARIN LEVEL (UNFRACTIONATED)
Heparin Unfractionated: <0.1 IU/mL — ABNORMAL LOW (ref 0.30–0.70)
Heparin Unfractionated: <0.1 IU/mL — ABNORMAL LOW (ref 0.30–0.70)

## 2023-05-30 LAB — ADAMTS13 ACTIVITY: Adamts 13 Activity: 28.6 % — CL (ref >66.8)

## 2023-05-30 LAB — ANTI-DNA ANTIBODY, DOUBLE-STRANDED: ds DNA Ab: <1 IU/mL (ref 0–9)

## 2023-05-30 LAB — CYCLIC CITRUL PEPTIDE ANTIBODY, IGG/IGA: CCP Antibodies IgG/IgA: 5 units (ref 0–19)

## 2023-05-30 LAB — HEPARIN INDUCED PLATELET AB (HIT ANTIBODY): Heparin Induced Plt Ab: 0.06 OD (ref 0.000–0.400)

## 2023-05-30 LAB — ANA: Anti Nuclear Antibody (ANA): NEGATIVE

## 2023-05-30 MED ORDER — DOCUSATE SODIUM 50 MG/5ML PO LIQD: 100.0000 mg | Freq: Two times a day (BID) | ORAL | Status: DC | PRN

## 2023-05-30 MED ORDER — SODIUM CHLORIDE 0.9% IV SOLUTION: Freq: Once | INTRAVENOUS | Status: AC

## 2023-05-30 MED ORDER — POLYETHYLENE GLYCOL 3350 17 G PO PACK: 17.0000 g | PACK | Freq: Every day | ORAL | Status: DC | PRN

## 2023-05-30 MED ORDER — FUROSEMIDE 10 MG/ML IJ SOLN
120.0000 mg | Freq: Once | INTRAVENOUS | Status: AC
  Administered 2023-05-30: 120 mg via INTRAVENOUS
  Filled 2023-05-30: qty 10

## 2023-05-30 NOTE — Progress Notes (Signed)
ANTICOAGULATION CONSULT NOTE - Initial Consult  Pharmacy Consult for heparin Indication: DVT  Allergies  Allergen Reactions   Ibuprofen Anaphylaxis    Patient Measurements: Weight: 87.3 kg (192 lb 7.4 oz) Heparin Dosing Weight: 82 kg  Vital Signs: Temp: 98.1 F (36.7 C) (09/24 0630) Temp Source: Esophageal (09/24 0000) Pulse Rate: 96 (09/24 0630)  Labs: Recent Labs    05/27/23 1514 05/27/23 1722 05/27/23 1917 05/27/23 1925 05/28/23 0302 05/28/23 0336 05/29/23 0226 05/29/23 0309 05/29/23 0927 05/29/23 1231 05/29/23 2339 05/29/23 2346 05/30/23 0542  HGB  --    < > 9.5*   < > 10.0*   < > 9.4* 9.5*  --   --   --  9.4* 9.0*  HCT  --    < > 27.5*   < > 28.6*   < > 26.9* 28.0*  --   --   --  27.5* 26.8*  PLT  --    < > 59*  --  48*  46*   < > 30*  32*  --   --   --   --  24* 20*  23*  APTT  --    < > 37*  --  33  --  29  --   --  30  --   --  51*  LABPROT  --    < > 15.5*  --  14.2  --  14.7  --   --  14.6  --   --  14.4  INR  --    < > 1.2  --  1.1  --  1.1  --   --  1.1  --   --  1.1  HEPARINUNFRC  --   --   --   --   --   --   --   --   --   --  <0.10*  --  <0.10*  CREATININE 3.27*  --   --    < > 3.30*  --  3.80*  --   --   --   --   --  4.31*  CKTOTAL  --   --   --   --   --   --   --   --  1,183*  --   --   --   --   TROPONINIHS 210*  --  240*  --   --   --   --   --   --   --   --   --   --    < > = values in this interval not displayed.    Estimated Creatinine Clearance: 25.2 mL/min (A) (by C-G formula based on SCr of 4.31 mg/dL (H)).   Medical History: Past Medical History:  Diagnosis Date   Acute encephalopathy 03/10/2022   Adenomatous polyp of descending colon    Asthma    Encephalopathy acute 03/01/2022   Hematochezia    HTN (hypertension)    Hypokalemia 03/04/2022   Need for hepatitis C screening test 04/27/2022   Seizures [facility])      Assessment: 32 yo male who presented with cardiac arrest 2/2 to seizure. Patient now presenting with new  DVT. Notable history of abdominopelvic hemorrhage on CT angio (9/21) with massive transfusion. aPTT/INR elevated on admission, now back to normal. Pharmacy consulted to dose heparin in the setting of DVT.  Per heme/onc and CCM, okay to anticoagulate using heparin.   HL <0.10 - undetectable and subtherapeutic. aPTT subtherapeutic  at 51 on 1050 units/hr. Hgb stable 9.4 > 6 and Plt persistently low at 20 now.  Goal of Therapy:  Heparin level 0.3-0.5 units/ml Monitor platelets by anticoagulation protocol: Yes   Plan:  No bolus in setting of increased bleed risk with thrombocytopenia Increase heparin gtt to 1200 units/hr  6 hour heparin level and aPTT Daily CBC and heparin level while on heparin Monitor for s/sx of bleeding   Thank you for involving pharmacy in the patient's care.   Theotis Burrow, PharmD PGY1 Acute Care Pharmacy Resident  05/30/2023 7:25 AM

## 2023-05-30 NOTE — Consult Note (Signed)
6.2*   --  5.2* 4.5   < > = values in this interval not displayed.   Liver Function Tests: Recent Labs  Lab 05/28/23 0302 05/29/23 0226 05/29/23 0927 05/30/23 0542  AST 603* 600*  --  211*  ALT 244* 254*  --  203*  ALKPHOS 48 74  --  74  BILITOT 3.2* 3.9* 3.6* 2.6*  PROT 5.9* 5.7*  --  5.5*  ALBUMIN 3.5 2.9*  --  2.6*   No results for input(s): "LIPASE", "AMYLASE" in the last 168 hours. Recent Labs  Lab 05/27/23 1514  AMMONIA 34   CBC: Recent Labs  Lab 05/25/23 1404 05/25/23 1414 05/26/23 1401 05/26/23 1404 05/27/23 1728 05/27/23 1917 05/28/23 1053 05/28/23 1648 05/29/23 0226 05/29/23 0309 05/29/23 2346 05/30/23 0542  WBC 8.0   < > 5.5   < > 5.0   < > 8.6  --  8.1  --  7.0 6.5  NEUTROABS 4.8  --  3.1  --  3.2  --  6.8  --   --   --   --   --   HGB 11.3*   < > 6.4*   < > 10.0*   < > 9.8*   < > 9.4* 9.5* 9.4* 9.0*  HCT 37.3*   < > 18.8*   < > 29.0*   < > 27.9*   < > 26.9* 28.0* 27.5* 26.8*  MCV 109.4*   < > 90.4   < > 81.2   < > 81.1  --  82.8  --  85.4 85.6  PLT 122*   < > 32*   < > 44*  43*   < > 46*  --  30*  32*  --  24* 20*  23*   < > = values in this interval not displayed.   PT/INR: Recent Labs  Lab 05/27/23 1917 05/28/23 0302 05/29/23 0226 05/29/23 1231 05/30/23 0542  INR 1.2 1.1 1.1 1.1 1.1   Recent Labs  Lab 05/25/23 1532 05/26/23 1401 05/29/23 0927  CKTOTAL 237 3,654* 1,183*   CBG: Recent Labs  Lab 05/29/23 1554 05/29/23 1949 05/29/23 2325 05/30/23 0353 05/30/23 0744  GLUCAP 103* 106* 104* 115* 108*    Iron Studies:  Recent Labs  Lab 05/27/23 1917  FERRITIN 2,399*    Xrays/Other Studies: VAS [place] LOWER EXTREMITY VENOUS (DVT)  Result Date: 05/29/2023  Lower Venous DVT Study Patient Name:  Timothy Hubbard  Date of Exam:   05/29/2023 Medical Rec #: [id number]      Accession #:    [id number] Date of Birth: July 21, 1991      Patient Gender: M Patient Age:   32 years Exam Location:  [facility] Procedure:      VAS [place] LOWER EXTREMITY  VENOUS (DVT) Referring Phys: MURALI RAMASWAMY --------------------------------------------------------------------------------  Indications: Edema, and DIC.  Risk Factors: Patient on ventilator and 3 pressors. Limitations: Line and Significant lower extremity edema, postioning, Foley. Comparison Study: No prior study on file Performing Technologist: Sherren Kerns RVS  Examination Guidelines: A complete evaluation includes B-mode imaging, spectral Doppler, color Doppler, and power Doppler as needed of all accessible portions of each vessel. Bilateral testing is considered an integral part of a complete examination. Limited examinations for reoccurring indications may be performed as noted. The reflux portion of the exam is performed with the patient in reverse Trendelenburg.  +---------+---------------+---------+-----------+----------+-------------------+ RIGHT    CompressibilityPhasicitySpontaneityPropertiesThrombus Aging      +---------+---------------+---------+-----------+----------+-------------------+ CFV  Doppler             +---------+---------------+---------+-----------+----------+-------------------+ PERO                                                  Not well visualized +---------+---------------+---------+-----------+----------+-------------------+     Summary: RIGHT: - Findings consistent with acute deep vein thrombosis involving the right peroneal veins.  - No cystic structure found in the popliteal fossa.  LEFT: - There is no evidence of deep vein thrombosis in the lower extremity. However, portions of this examination were limited- see technologist comments above.  *See table(s) above for measurements and observations. Electronically signed by Sherald Hess MD on 05/29/2023 at 12:56:39 PM.    Final    ECHOCARDIOGRAM LIMITED  Result Date: 05/28/2023    ECHOCARDIOGRAM LIMITED REPORT   Patient Name:   Timothy Hubbard Date of Exam: 05/28/2023 Medical Rec #:  [id number]     Height:       66.0 in Accession #:    [id number]    Weight:       181.4 lb Date of Birth:  [birth year]     BSA:          1.919 m Patient Age:    32 years      BP:           110/77 mmHg Patient Gender: M             HR:           95 bpm. Exam Location:  Inpatient Procedure: Limited Echo, Color Doppler and Cardiac Doppler Indications:    Cardiac Arrest i46.9  History:        Patient has prior history of Echocardiogram examinations, most                 recent 05/26/2023. Risk Factors:Hypertension and ETOH.  Sonographer:    Irving Burton Senior RDCS Referring Phys: [phone] [facility]   Sonographer Comments: Scanned upright on artificial respirator. IMPRESSIONS  1. Limited study.  2. Left ventricular ejection fraction, by estimation, is 20 to 25%. The left ventricle has severely decreased function. The left ventricle demonstrates global hypokinesis.  3. RV-RA gradient 21 mmHg suggesting normal to mildly increased estimated RVSP depending on CVP. Right ventricular systolic function is severely reduced. The right ventricular size is moderately enlarged.  4. The mitral valve is grossly normal. Mild mitral valve regurgitation.  5. The tricuspid valve is abnormal. Tricuspid valve regurgitation is moderate to severe.  6. The aortic valve is tricuspid.  7. Unable to estimate CVP. Comparison(s): Prior images reviewed side by side. LVEF 20-25% with global hypokinesis and decrease compared with prior study. FINDINGS  Left Ventricle: Left ventricular ejection fraction, by estimation, is 20 to 25%. The left ventricle has severely decreased function. The left ventricle demonstrates global hypokinesis. Right Ventricle: RV-RA gradient 21 mmHg suggesting normal to mildly increased estimated RVSP depending on CVP. The right ventricular size is moderately enlarged. Right ventricular systolic function is severely reduced. Mitral Valve: The mitral valve is grossly normal. Mild mitral valve regurgitation. Tricuspid Valve: The tricuspid valve is abnormal. Tricuspid valve regurgitation is moderate to severe. Aortic Valve: The aortic valve is tricuspid. Venous: Unable to estimate CVP. IVC assessment for right atrial pressure unable to be performed due to mechanical ventilation. Additional Comments: Spectral Doppler performed. Color Doppler performed.  Doppler             +---------+---------------+---------+-----------+----------+-------------------+ PERO                                                  Not well visualized +---------+---------------+---------+-----------+----------+-------------------+     Summary: RIGHT: - Findings consistent with acute deep vein thrombosis involving the right peroneal veins.  - No cystic structure found in the popliteal fossa.  LEFT: - There is no evidence of deep vein thrombosis in the lower extremity. However, portions of this examination were limited- see technologist comments above.  *See table(s) above for measurements and observations. Electronically signed by Sherald Hess MD on 05/29/2023 at 12:56:39 PM.    Final    ECHOCARDIOGRAM LIMITED  Result Date: 05/28/2023    ECHOCARDIOGRAM LIMITED REPORT   Patient Name:   Timothy Hubbard Date of Exam: 05/28/2023 Medical Rec #:  [id number]     Height:       66.0 in Accession #:    [id number]    Weight:       181.4 lb Date of Birth:  [birth year]     BSA:          1.919 m Patient Age:    32 years      BP:           110/77 mmHg Patient Gender: M             HR:           95 bpm. Exam Location:  Inpatient Procedure: Limited Echo, Color Doppler and Cardiac Doppler Indications:    Cardiac Arrest i46.9  History:        Patient has prior history of Echocardiogram examinations, most                 recent 05/26/2023. Risk Factors:Hypertension and ETOH.  Sonographer:    Irving Burton Senior RDCS Referring Phys: [phone] [facility]   Sonographer Comments: Scanned upright on artificial respirator. IMPRESSIONS  1. Limited study.  2. Left ventricular ejection fraction, by estimation, is 20 to 25%. The left ventricle has severely decreased function. The left ventricle demonstrates global hypokinesis.  3. RV-RA gradient 21 mmHg suggesting normal to mildly increased estimated RVSP depending on CVP. Right ventricular systolic function is severely reduced. The right ventricular size is moderately enlarged.  4. The mitral valve is grossly normal. Mild mitral valve regurgitation.  5. The tricuspid valve is abnormal. Tricuspid valve regurgitation is moderate to severe.  6. The aortic valve is tricuspid.  7. Unable to estimate CVP. Comparison(s): Prior images reviewed side by side. LVEF 20-25% with global hypokinesis and decrease compared with prior study. FINDINGS  Left Ventricle: Left ventricular ejection fraction, by estimation, is 20 to 25%. The left ventricle has severely decreased function. The left ventricle demonstrates global hypokinesis. Right Ventricle: RV-RA gradient 21 mmHg suggesting normal to mildly increased estimated RVSP depending on CVP. The right ventricular size is moderately enlarged. Right ventricular systolic function is severely reduced. Mitral Valve: The mitral valve is grossly normal. Mild mitral valve regurgitation. Tricuspid Valve: The tricuspid valve is abnormal. Tricuspid valve regurgitation is moderate to severe. Aortic Valve: The aortic valve is tricuspid. Venous: Unable to estimate CVP. IVC assessment for right atrial pressure unable to be performed due to mechanical ventilation. Additional Comments: Spectral Doppler performed. Color Doppler performed.  6.2*   --  5.2* 4.5   < > = values in this interval not displayed.   Liver Function Tests: Recent Labs  Lab 05/28/23 0302 05/29/23 0226 05/29/23 0927 05/30/23 0542  AST 603* 600*  --  211*  ALT 244* 254*  --  203*  ALKPHOS 48 74  --  74  BILITOT 3.2* 3.9* 3.6* 2.6*  PROT 5.9* 5.7*  --  5.5*  ALBUMIN 3.5 2.9*  --  2.6*   No results for input(s): "LIPASE", "AMYLASE" in the last 168 hours. Recent Labs  Lab 05/27/23 1514  AMMONIA 34   CBC: Recent Labs  Lab 05/25/23 1404 05/25/23 1414 05/26/23 1401 05/26/23 1404 05/27/23 1728 05/27/23 1917 05/28/23 1053 05/28/23 1648 05/29/23 0226 05/29/23 0309 05/29/23 2346 05/30/23 0542  WBC 8.0   < > 5.5   < > 5.0   < > 8.6  --  8.1  --  7.0 6.5  NEUTROABS 4.8  --  3.1  --  3.2  --  6.8  --   --   --   --   --   HGB 11.3*   < > 6.4*   < > 10.0*   < > 9.8*   < > 9.4* 9.5* 9.4* 9.0*  HCT 37.3*   < > 18.8*   < > 29.0*   < > 27.9*   < > 26.9* 28.0* 27.5* 26.8*  MCV 109.4*   < > 90.4   < > 81.2   < > 81.1  --  82.8  --  85.4 85.6  PLT 122*   < > 32*   < > 44*  43*   < > 46*  --  30*  32*  --  24* 20*  23*   < > = values in this interval not displayed.   PT/INR: Recent Labs  Lab 05/27/23 1917 05/28/23 0302 05/29/23 0226 05/29/23 1231 05/30/23 0542  INR 1.2 1.1 1.1 1.1 1.1   Recent Labs  Lab 05/25/23 1532 05/26/23 1401 05/29/23 0927  CKTOTAL 237 3,654* 1,183*   CBG: Recent Labs  Lab 05/29/23 1554 05/29/23 1949 05/29/23 2325 05/30/23 0353 05/30/23 0744  GLUCAP 103* 106* 104* 115* 108*    Iron Studies:  Recent Labs  Lab 05/27/23 1917  FERRITIN 2,399*    Xrays/Other Studies: VAS [place] LOWER EXTREMITY VENOUS (DVT)  Result Date: 05/29/2023  Lower Venous DVT Study Patient Name:  Timothy Hubbard  Date of Exam:   05/29/2023 Medical Rec #: [id number]      Accession #:    [id number] Date of Birth: July 21, 1991      Patient Gender: M Patient Age:   32 years Exam Location:  [facility] Procedure:      VAS [place] LOWER EXTREMITY  VENOUS (DVT) Referring Phys: MURALI RAMASWAMY --------------------------------------------------------------------------------  Indications: Edema, and DIC.  Risk Factors: Patient on ventilator and 3 pressors. Limitations: Line and Significant lower extremity edema, postioning, Foley. Comparison Study: No prior study on file Performing Technologist: Sherren Kerns RVS  Examination Guidelines: A complete evaluation includes B-mode imaging, spectral Doppler, color Doppler, and power Doppler as needed of all accessible portions of each vessel. Bilateral testing is considered an integral part of a complete examination. Limited examinations for reoccurring indications may be performed as noted. The reflux portion of the exam is performed with the patient in reverse Trendelenburg.  +---------+---------------+---------+-----------+----------+-------------------+ RIGHT    CompressibilityPhasicitySpontaneityPropertiesThrombus Aging      +---------+---------------+---------+-----------+----------+-------------------+ CFV  6.2*   --  5.2* 4.5   < > = values in this interval not displayed.   Liver Function Tests: Recent Labs  Lab 05/28/23 0302 05/29/23 0226 05/29/23 0927 05/30/23 0542  AST 603* 600*  --  211*  ALT 244* 254*  --  203*  ALKPHOS 48 74  --  74  BILITOT 3.2* 3.9* 3.6* 2.6*  PROT 5.9* 5.7*  --  5.5*  ALBUMIN 3.5 2.9*  --  2.6*   No results for input(s): "LIPASE", "AMYLASE" in the last 168 hours. Recent Labs  Lab 05/27/23 1514  AMMONIA 34   CBC: Recent Labs  Lab 05/25/23 1404 05/25/23 1414 05/26/23 1401 05/26/23 1404 05/27/23 1728 05/27/23 1917 05/28/23 1053 05/28/23 1648 05/29/23 0226 05/29/23 0309 05/29/23 2346 05/30/23 0542  WBC 8.0   < > 5.5   < > 5.0   < > 8.6  --  8.1  --  7.0 6.5  NEUTROABS 4.8  --  3.1  --  3.2  --  6.8  --   --   --   --   --   HGB 11.3*   < > 6.4*   < > 10.0*   < > 9.8*   < > 9.4* 9.5* 9.4* 9.0*  HCT 37.3*   < > 18.8*   < > 29.0*   < > 27.9*   < > 26.9* 28.0* 27.5* 26.8*  MCV 109.4*   < > 90.4   < > 81.2   < > 81.1  --  82.8  --  85.4 85.6  PLT 122*   < > 32*   < > 44*  43*   < > 46*  --  30*  32*  --  24* 20*  23*   < > = values in this interval not displayed.   PT/INR: Recent Labs  Lab 05/27/23 1917 05/28/23 0302 05/29/23 0226 05/29/23 1231 05/30/23 0542  INR 1.2 1.1 1.1 1.1 1.1   Recent Labs  Lab 05/25/23 1532 05/26/23 1401 05/29/23 0927  CKTOTAL 237 3,654* 1,183*   CBG: Recent Labs  Lab 05/29/23 1554 05/29/23 1949 05/29/23 2325 05/30/23 0353 05/30/23 0744  GLUCAP 103* 106* 104* 115* 108*    Iron Studies:  Recent Labs  Lab 05/27/23 1917  FERRITIN 2,399*    Xrays/Other Studies: VAS [place] LOWER EXTREMITY VENOUS (DVT)  Result Date: 05/29/2023  Lower Venous DVT Study Patient Name:  Timothy Hubbard  Date of Exam:   05/29/2023 Medical Rec #: [id number]      Accession #:    [id number] Date of Birth: July 21, 1991      Patient Gender: M Patient Age:   32 years Exam Location:  [facility] Procedure:      VAS [place] LOWER EXTREMITY  VENOUS (DVT) Referring Phys: MURALI RAMASWAMY --------------------------------------------------------------------------------  Indications: Edema, and DIC.  Risk Factors: Patient on ventilator and 3 pressors. Limitations: Line and Significant lower extremity edema, postioning, Foley. Comparison Study: No prior study on file Performing Technologist: Sherren Kerns RVS  Examination Guidelines: A complete evaluation includes B-mode imaging, spectral Doppler, color Doppler, and power Doppler as needed of all accessible portions of each vessel. Bilateral testing is considered an integral part of a complete examination. Limited examinations for reoccurring indications may be performed as noted. The reflux portion of the exam is performed with the patient in reverse Trendelenburg.  +---------+---------------+---------+-----------+----------+-------------------+ RIGHT    CompressibilityPhasicitySpontaneityPropertiesThrombus Aging      +---------+---------------+---------+-----------+----------+-------------------+ CFV  Doppler             +---------+---------------+---------+-----------+----------+-------------------+ PERO                                                  Not well visualized +---------+---------------+---------+-----------+----------+-------------------+     Summary: RIGHT: - Findings consistent with acute deep vein thrombosis involving the right peroneal veins.  - No cystic structure found in the popliteal fossa.  LEFT: - There is no evidence of deep vein thrombosis in the lower extremity. However, portions of this examination were limited- see technologist comments above.  *See table(s) above for measurements and observations. Electronically signed by Sherald Hess MD on 05/29/2023 at 12:56:39 PM.    Final    ECHOCARDIOGRAM LIMITED  Result Date: 05/28/2023    ECHOCARDIOGRAM LIMITED REPORT   Patient Name:   Timothy Hubbard Date of Exam: 05/28/2023 Medical Rec #:  [id number]     Height:       66.0 in Accession #:    [id number]    Weight:       181.4 lb Date of Birth:  [birth year]     BSA:          1.919 m Patient Age:    32 years      BP:           110/77 mmHg Patient Gender: M             HR:           95 bpm. Exam Location:  Inpatient Procedure: Limited Echo, Color Doppler and Cardiac Doppler Indications:    Cardiac Arrest i46.9  History:        Patient has prior history of Echocardiogram examinations, most                 recent 05/26/2023. Risk Factors:Hypertension and ETOH.  Sonographer:    Irving Burton Senior RDCS Referring Phys: [phone] [facility]   Sonographer Comments: Scanned upright on artificial respirator. IMPRESSIONS  1. Limited study.  2. Left ventricular ejection fraction, by estimation, is 20 to 25%. The left ventricle has severely decreased function. The left ventricle demonstrates global hypokinesis.  3. RV-RA gradient 21 mmHg suggesting normal to mildly increased estimated RVSP depending on CVP. Right ventricular systolic function is severely reduced. The right ventricular size is moderately enlarged.  4. The mitral valve is grossly normal. Mild mitral valve regurgitation.  5. The tricuspid valve is abnormal. Tricuspid valve regurgitation is moderate to severe.  6. The aortic valve is tricuspid.  7. Unable to estimate CVP. Comparison(s): Prior images reviewed side by side. LVEF 20-25% with global hypokinesis and decrease compared with prior study. FINDINGS  Left Ventricle: Left ventricular ejection fraction, by estimation, is 20 to 25%. The left ventricle has severely decreased function. The left ventricle demonstrates global hypokinesis. Right Ventricle: RV-RA gradient 21 mmHg suggesting normal to mildly increased estimated RVSP depending on CVP. The right ventricular size is moderately enlarged. Right ventricular systolic function is severely reduced. Mitral Valve: The mitral valve is grossly normal. Mild mitral valve regurgitation. Tricuspid Valve: The tricuspid valve is abnormal. Tricuspid valve regurgitation is moderate to severe. Aortic Valve: The aortic valve is tricuspid. Venous: Unable to estimate CVP. IVC assessment for right atrial pressure unable to be performed due to mechanical ventilation. Additional Comments: Spectral Doppler performed. Color Doppler performed.  Doppler             +---------+---------------+---------+-----------+----------+-------------------+ PERO                                                  Not well visualized +---------+---------------+---------+-----------+----------+-------------------+     Summary: RIGHT: - Findings consistent with acute deep vein thrombosis involving the right peroneal veins.  - No cystic structure found in the popliteal fossa.  LEFT: - There is no evidence of deep vein thrombosis in the lower extremity. However, portions of this examination were limited- see technologist comments above.  *See table(s) above for measurements and observations. Electronically signed by Sherald Hess MD on 05/29/2023 at 12:56:39 PM.    Final    ECHOCARDIOGRAM LIMITED  Result Date: 05/28/2023    ECHOCARDIOGRAM LIMITED REPORT   Patient Name:   Timothy Hubbard Date of Exam: 05/28/2023 Medical Rec #:  [id number]     Height:       66.0 in Accession #:    [id number]    Weight:       181.4 lb Date of Birth:  [birth year]     BSA:          1.919 m Patient Age:    32 years      BP:           110/77 mmHg Patient Gender: M             HR:           95 bpm. Exam Location:  Inpatient Procedure: Limited Echo, Color Doppler and Cardiac Doppler Indications:    Cardiac Arrest i46.9  History:        Patient has prior history of Echocardiogram examinations, most                 recent 05/26/2023. Risk Factors:Hypertension and ETOH.  Sonographer:    Irving Burton Senior RDCS Referring Phys: [phone] [facility]   Sonographer Comments: Scanned upright on artificial respirator. IMPRESSIONS  1. Limited study.  2. Left ventricular ejection fraction, by estimation, is 20 to 25%. The left ventricle has severely decreased function. The left ventricle demonstrates global hypokinesis.  3. RV-RA gradient 21 mmHg suggesting normal to mildly increased estimated RVSP depending on CVP. Right ventricular systolic function is severely reduced. The right ventricular size is moderately enlarged.  4. The mitral valve is grossly normal. Mild mitral valve regurgitation.  5. The tricuspid valve is abnormal. Tricuspid valve regurgitation is moderate to severe.  6. The aortic valve is tricuspid.  7. Unable to estimate CVP. Comparison(s): Prior images reviewed side by side. LVEF 20-25% with global hypokinesis and decrease compared with prior study. FINDINGS  Left Ventricle: Left ventricular ejection fraction, by estimation, is 20 to 25%. The left ventricle has severely decreased function. The left ventricle demonstrates global hypokinesis. Right Ventricle: RV-RA gradient 21 mmHg suggesting normal to mildly increased estimated RVSP depending on CVP. The right ventricular size is moderately enlarged. Right ventricular systolic function is severely reduced. Mitral Valve: The mitral valve is grossly normal. Mild mitral valve regurgitation. Tricuspid Valve: The tricuspid valve is abnormal. Tricuspid valve regurgitation is moderate to severe. Aortic Valve: The aortic valve is tricuspid. Venous: Unable to estimate CVP. IVC assessment for right atrial pressure unable to be performed due to mechanical ventilation. Additional Comments: Spectral Doppler performed. Color Doppler performed.  Doppler             +---------+---------------+---------+-----------+----------+-------------------+ PERO                                                  Not well visualized +---------+---------------+---------+-----------+----------+-------------------+     Summary: RIGHT: - Findings consistent with acute deep vein thrombosis involving the right peroneal veins.  - No cystic structure found in the popliteal fossa.  LEFT: - There is no evidence of deep vein thrombosis in the lower extremity. However, portions of this examination were limited- see technologist comments above.  *See table(s) above for measurements and observations. Electronically signed by Sherald Hess MD on 05/29/2023 at 12:56:39 PM.    Final    ECHOCARDIOGRAM LIMITED  Result Date: 05/28/2023    ECHOCARDIOGRAM LIMITED REPORT   Patient Name:   Timothy Hubbard Date of Exam: 05/28/2023 Medical Rec #:  [id number]     Height:       66.0 in Accession #:    [id number]    Weight:       181.4 lb Date of Birth:  [birth year]     BSA:          1.919 m Patient Age:    32 years      BP:           110/77 mmHg Patient Gender: M             HR:           95 bpm. Exam Location:  Inpatient Procedure: Limited Echo, Color Doppler and Cardiac Doppler Indications:    Cardiac Arrest i46.9  History:        Patient has prior history of Echocardiogram examinations, most                 recent 05/26/2023. Risk Factors:Hypertension and ETOH.  Sonographer:    Irving Burton Senior RDCS Referring Phys: [phone] [facility]   Sonographer Comments: Scanned upright on artificial respirator. IMPRESSIONS  1. Limited study.  2. Left ventricular ejection fraction, by estimation, is 20 to 25%. The left ventricle has severely decreased function. The left ventricle demonstrates global hypokinesis.  3. RV-RA gradient 21 mmHg suggesting normal to mildly increased estimated RVSP depending on CVP. Right ventricular systolic function is severely reduced. The right ventricular size is moderately enlarged.  4. The mitral valve is grossly normal. Mild mitral valve regurgitation.  5. The tricuspid valve is abnormal. Tricuspid valve regurgitation is moderate to severe.  6. The aortic valve is tricuspid.  7. Unable to estimate CVP. Comparison(s): Prior images reviewed side by side. LVEF 20-25% with global hypokinesis and decrease compared with prior study. FINDINGS  Left Ventricle: Left ventricular ejection fraction, by estimation, is 20 to 25%. The left ventricle has severely decreased function. The left ventricle demonstrates global hypokinesis. Right Ventricle: RV-RA gradient 21 mmHg suggesting normal to mildly increased estimated RVSP depending on CVP. The right ventricular size is moderately enlarged. Right ventricular systolic function is severely reduced. Mitral Valve: The mitral valve is grossly normal. Mild mitral valve regurgitation. Tricuspid Valve: The tricuspid valve is abnormal. Tricuspid valve regurgitation is moderate to severe. Aortic Valve: The aortic valve is tricuspid. Venous: Unable to estimate CVP. IVC assessment for right atrial pressure unable to be performed due to mechanical ventilation. Additional Comments: Spectral Doppler performed. Color Doppler performed.  Doppler             +---------+---------------+---------+-----------+----------+-------------------+ PERO                                                  Not well visualized +---------+---------------+---------+-----------+----------+-------------------+     Summary: RIGHT: - Findings consistent with acute deep vein thrombosis involving the right peroneal veins.  - No cystic structure found in the popliteal fossa.  LEFT: - There is no evidence of deep vein thrombosis in the lower extremity. However, portions of this examination were limited- see technologist comments above.  *See table(s) above for measurements and observations. Electronically signed by Sherald Hess MD on 05/29/2023 at 12:56:39 PM.    Final    ECHOCARDIOGRAM LIMITED  Result Date: 05/28/2023    ECHOCARDIOGRAM LIMITED REPORT   Patient Name:   Timothy Hubbard Date of Exam: 05/28/2023 Medical Rec #:  [id number]     Height:       66.0 in Accession #:    [id number]    Weight:       181.4 lb Date of Birth:  [birth year]     BSA:          1.919 m Patient Age:    32 years      BP:           110/77 mmHg Patient Gender: M             HR:           95 bpm. Exam Location:  Inpatient Procedure: Limited Echo, Color Doppler and Cardiac Doppler Indications:    Cardiac Arrest i46.9  History:        Patient has prior history of Echocardiogram examinations, most                 recent 05/26/2023. Risk Factors:Hypertension and ETOH.  Sonographer:    Irving Burton Senior RDCS Referring Phys: [phone] [facility]   Sonographer Comments: Scanned upright on artificial respirator. IMPRESSIONS  1. Limited study.  2. Left ventricular ejection fraction, by estimation, is 20 to 25%. The left ventricle has severely decreased function. The left ventricle demonstrates global hypokinesis.  3. RV-RA gradient 21 mmHg suggesting normal to mildly increased estimated RVSP depending on CVP. Right ventricular systolic function is severely reduced. The right ventricular size is moderately enlarged.  4. The mitral valve is grossly normal. Mild mitral valve regurgitation.  5. The tricuspid valve is abnormal. Tricuspid valve regurgitation is moderate to severe.  6. The aortic valve is tricuspid.  7. Unable to estimate CVP. Comparison(s): Prior images reviewed side by side. LVEF 20-25% with global hypokinesis and decrease compared with prior study. FINDINGS  Left Ventricle: Left ventricular ejection fraction, by estimation, is 20 to 25%. The left ventricle has severely decreased function. The left ventricle demonstrates global hypokinesis. Right Ventricle: RV-RA gradient 21 mmHg suggesting normal to mildly increased estimated RVSP depending on CVP. The right ventricular size is moderately enlarged. Right ventricular systolic function is severely reduced. Mitral Valve: The mitral valve is grossly normal. Mild mitral valve regurgitation. Tricuspid Valve: The tricuspid valve is abnormal. Tricuspid valve regurgitation is moderate to severe. Aortic Valve: The aortic valve is tricuspid. Venous: Unable to estimate CVP. IVC assessment for right atrial pressure unable to be performed due to mechanical ventilation. Additional Comments: Spectral Doppler performed. Color Doppler performed.

## 2023-05-30 NOTE — Progress Notes (Signed)
eLink Physician-Brief Progress Note Patient Name: Timothy Hubbard DOB: 11-28-1990 MRN: [id number]   Date of Service  05/30/2023  HPI/Events of Note  [name] w/ pertinent PMH seizures, asthma, htn, mood disorder presents to [facility] ED on 9/19 w/ seizure and cardiac arrest    Patient has a peroneal DVT and is currently on heparin drip.  No active signs of bleeding.  Oncology is seeing the patient for the thrombocytopenia and recommended no immediate intervention.  eICU Interventions  Continue observation at platelet count of 24.  Transfuse for platelets less than 20 with active bleeding or less than 10 otherwise.     Intervention Category Intermediate Interventions: Thrombocytopenia - evaluation and management  Robertson Colclough 05/30/2023, 12:28 AM

## 2023-05-30 NOTE — Progress Notes (Signed)
LTM maint complete - no skin breakdown [facility] monitored, Event button test confirmed by [facility]. ? ?

## 2023-05-30 NOTE — Plan of Care (Signed)
  Problem: Cardiac: Goal: Ability to achieve and maintain adequate cardiopulmonary perfusion will improve Outcome: Progressing   Problem: Activity: Goal: Risk for activity intolerance will decrease Outcome: Progressing   Problem: Nutrition: Goal: Adequate nutrition will be maintained Outcome: Progressing   Problem: Elimination: Goal: Will not experience complications related to bowel motility Outcome: Progressing

## 2023-05-30 NOTE — Progress Notes (Signed)
NAME:  Timothy Hubbard, MRN:  [id number], DOB:  22-Mar-1991, LOS: 5 ADMISSION DATE:  05/25/2023, CONSULTATION DATE:  05/25/2023 REFERRING MD:  EDP, CHIEF COMPLAINT:  Seizures and cardiac arrest    History of Present Illness:  Patient is a [name] w/ pertinent PMH seizures, asthma, htn, mood disorder presents to [facility] ED on 9/19 w/ seizure and cardiac arrest.   Per family patient compliant w/ seizure medications.  Had a witnessed seizure at home. EMS arrived and patient unresponsive. On route to ED patient became bradycardic 20-30 and pea arrested. 1 round cpr performed. Patient breathing spontaneously post arrest. Started on epi drip. On ED arrival patient altered but withdrew to painful stimuli. During intubation patient pea arrested and rosc after 1 round of cpr. EKG sinus tachy 130s. Patient remained hypotensive post intubation and cvl placed. Currently on 3 pressors. Temp 92 F and placed on bair hugger. LA 14. Given IV fluids and cultures obtained and placed on broad spectrum antibiotics. CXR w/ ett in good position; no infiltrate appreciated. Abg 6.88, 56, 379, 11. Given 2 amps of bicarb and ordered bicarb drip. CTA head, abd, pelvis ordered/pending. PCCM consulted for icu admission.  Pertinent  Medical History  past medical history of Acute encephalopathy (03/10/2022), Adenomatous polyp of descending colon, Asthma, Encephalopathy acute (03/01/2022), Hematochezia, HTN (hypertension), Hypokalemia (03/04/2022), Need for hepatitis C screening test (04/27/2022), and Seizures (HCC)  Significant Hospital Events: Including procedures, antibiotic start and stop dates in addition to other pertinent events   9/19 admit to [facility]; seizure and cardiac arrest 9/20 extubated 05/27/2023: PEA arrest and after woke up and was confused.  Total of 2 rounds of CPR with epi and bicarb x 1.  But became bradycardic > intubated.  Had A-line placed.  Also central line ascites tap did show blood.  Was given 4 units PRBC, 4 units  FFP and 2 units platelets and 1 unit of cryo major transfusion protocol Cc consult for hemoperitoneum.  DIC panel positive.  He had some oozing from mouth and IV sites EEG showed symmetric low voltage no/seizure activity  Interim History / Subjective:  Overnight events: Platelet count 20. No active bleeding at this time.  Patient evaluated bedside this morning. Patient is still intubated and sedated. Patient is not able to participate in conversation.   Objective   Blood pressure 93/79, pulse 96, temperature 98.1 F (36.7 C), resp. rate 18, weight 87.3 kg, SpO2 100%.    Vent Mode: PRVC FiO2 (%):  [40 %] 40 % Set Rate:  [14 bmp-17 bmp] 14 bmp Vt Set:  [470 mL] 470 mL PEEP:  [8 cmH20] 8 cmH20 Plateau Pressure:  [25 cmH20-27 cmH20] 27 cmH20   Intake/Output Summary (Last 24 hours) at 05/30/2023 0733 Last data filed at 05/30/2023 0600 Gross per 24 hour  Intake 2252.95 ml  Output 172 ml  Net 2080.95 ml   Filed Weights   05/28/23 0407 05/29/23 0500 05/30/23 0408  Weight: 82.3 kg 81.6 kg 87.3 kg    Examination: General: Intubated, sedated, acute distress HENT: Normocephalic, atraumatic, ET tube in place, OG tube in place Lungs: Decreased lung sounds bilaterally Cardiovascular: Tachycardic rate,no murmurs, rubs, or gallops Abdomen: Abdomen seems to be a bit taut today, but is still soft. Hypoactive bowel sounds  Extremities: Right lower extremity swelling appreciated.  Ischemic changes noted to bilateral upper and lower extremities Neuro: Intubated and sedated GU: Foley in place, minimal urine output   Resolved Hospital Problem list   #Lactic acidosis  Assessment & Plan:  This is a 32 year old male with a past medical history of seizures, asthma, hypertension who presents to the ED for concerns of seizure and had out-of-hospital cardiac arrest.   #Out-of-hospital cardiac arrest #Acute hypoxemic respiratory failure on mechanical ventilation Patient has been weaned off all  pressor support at this time.  Patient is currently intubated and sedated.  Not able to withdraw to painful stimuli.  Will likely need to wake patient up and see if patient can have some neurologic function.  Gag reflex and corneal reflex intact.  Will try to wean off sedation today. -Wean off sedation today  -VAP bundle  -Try to assess patient off of sedation to see his neurologic function   #Sepsis Patient febrile yesterday to 101 1 F.  Unclear etiology if patient has no infection or could be related to clot break down.  Patient has been on Zosyn, and has been transitioned to Unasyn yesterday for concern for aspiration pneumonia.  -Continue Unasyn day 2, day 5 of total antibiotics -Continue to follow cultures  #Acute right peroneal vein DVT Patient started on heparin yesterday for acute right peroneal vein DVT.  Patient's platelet counts are very borderline.  Will need to keep a close eye on platelet count.  On exam today, no obvious signs of bleeding.  Hemoglobin has been stable from 9.4 to 9.0 this morning.  Will continue to monitor for further bleeding. -Continue to trend CBC and platelet count -Transfuse platelet if platelet drops below 10, but if patient does start bleeding, transfuse if platelets less than 20 -Stop heparin until platelet count back to 30   #Seizure disorder Patient has been on Versed infusion, and has been on long-term EEG.  Patient also has been on Keppra 500 mg twice daily.  No further seizure-like activity noted so far.  Will plan to start weaning off sedation today.  Hopefully patient does not have any further seizure-like activity. -Continue Keppra 500 mg twice daily -Neurology following -Wean off sedation today -Long-term EEG   #Shock liver #Anemia of critical illness #Hemoperitoneum #Thrombocytopenia Hematology evaluated patient today who did not think that DIC, but rather to shock liver.  This is improving.  Anemia is stable.  No further bleeding.   Platelet counts are significantly decreased.  This is all likely secondary to shock liver.  Liver enzymes are slowly trending down.  Did want to get a right upper quadrant ultrasound yesterday, but given etiology of shock liver, this would not change management at this time.  I do anticipate that the platelet count will improve slowly, but will need some time.  -Hemoglobin 9.0 today -Hematology following, appreciate recommendations -Coagulation studies within normal limits -Will monitor for further bleeding episodes -Transfuse platelet if platelet drops below 10, but if patient does start bleeding, transfuse if platelets less than 20 -Stop Heparin until platelet count back to 30   #HFrEF Echo showing reduced ejection fraction of 20 to 25%.  This could be likely secondary to stunned myocardium. -Need to hold GDMT in setting of acute kidney injury -Need to hold GDMT as hypotension will not allow as well  #Right pleural effusion Patient is to require mechanical ventilation.  Breath sounds are still decreased bilaterally.  -Might need a thoracentesis but platelet count will not allow this at this time  -Will challenge the patient with lasix today to see if this can help   #Acute renal failure Creatinine elevated to 4.31 today.  Kidney function is not improving.  Nephrology following.  This is likely related to ATN.= -Nephrology following, appreciate recommendations -Acute renal failure likely in setting of rhabdo plus ATN -Not improving, Will lasix challenge patient today to see if this can help   Best Practice (right click and "Reselect all SmartList Selections" daily)   Diet/type: tubefeeds DVT prophylaxis: SCD GI prophylaxis: PPI Lines: Central line Foley:  Yes, and it is still needed Code Status:  full code  Labs   CBC: Recent Labs  Lab 05/25/23 1404 05/25/23 1414 05/26/23 1401 05/26/23 1404 05/27/23 1728 05/27/23 1917 05/28/23 0302 05/28/23 0336 05/28/23 1053  05/28/23 1648 05/29/23 0226 05/29/23 0309 05/29/23 2346 05/30/23 0542  WBC 8.0   < > 5.5   < > 5.0  --  6.8  --  8.6  --  8.1  --  7.0 6.5  NEUTROABS 4.8  --  3.1  --  3.2  --   --   --  6.8  --   --   --   --   --   HGB 11.3*   < > 6.4*   < > 10.0*   < > 10.0*   < > 9.8* 9.9* 9.4* 9.5* 9.4* 9.0*  HCT 37.3*   < > 18.8*   < > 29.0*   < > 28.6*   < > 27.9* 29.0* 26.9* 28.0* 27.5* 26.8*  MCV 109.4*   < > 90.4   < > 81.2  --  79.9*  --  81.1  --  82.8  --  85.4 85.6  PLT 122*   < > 32*   < > 44*  43*   < > 48*  46*  --  46*  --  30*  32*  --  24* 20*  23*   < > = values in this interval not displayed.    Basic Metabolic Panel: Recent Labs  Lab 05/26/23 0507 05/27/23 0408 05/27/23 1514 05/27/23 1722 05/27/23 1925 05/27/23 1927 05/28/23 0302 05/28/23 0336 05/28/23 6045 05/28/23 1648 05/29/23 0226 05/29/23 0309 05/29/23 1627 05/30/23 0542  NA 134*   < > 137   < > 135   < > 133*   < > 132* 132* 135 133*  --  136  K 2.7*   < > 4.1   < > 3.9   < > 4.4   < > 4.2 3.9 3.5 3.6  --  3.6  CL 93*   < > 83*  --  85*  --  83*  --   --   --  88*  --   --  86*  CO2 12*   < > 27  --  30  --  28  --   --   --  26  --   --  30  GLUCOSE 431*   < > 112*  --  131*  --  108*  --   --   --  130*  --   --  115*  BUN 13   < > 18  --  16  --  18  --   --   --  19  --   --  23*  CREATININE 2.80*   < > 3.27*  --  2.83*  --  3.30*  --   --   --  3.80*  --   --  4.31*  CALCIUM 6.3*   < > 6.6*  --  7.8*  --  7.3*  --   --   --  8.0*  --   --  7.9*  MG 2.2  --  1.6*  --   --   --   --   --   --   --  1.9  --  2.4 2.2  PHOS 7.7*  --   --   --   --   --   --   --   --   --  6.2*  --  5.2* 4.5   < > = values in this interval not displayed.   GFR: Estimated Creatinine Clearance: 25.2 mL/min (A) (by C-G formula based on SCr of 4.31 mg/dL (H)). Recent Labs  Lab 05/25/23 1532 05/26/23 0128 05/26/23 1401 05/26/23 2316 05/27/23 0034 05/27/23 0408 05/27/23 1514 05/27/23 1728 05/27/23 2122 05/28/23 0302  05/28/23 1053 05/29/23 0226 05/29/23 2346 05/30/23 0542  PROCALCITON 0.60  --   --   --   --   --   --   --   --   --   --   --   --   --   WBC  --    < > 5.5   < >  --    < >  --    < >  --    < > 8.6 8.1 7.0 6.5  LATICACIDVEN  --    < > >9.0*  --  7.3*  --  >9.0*  --  3.0*  --   --   --   --   --    < > = values in this interval not displayed.    Liver Function Tests: Recent Labs  Lab 05/26/23 1401 05/27/23 0408 05/28/23 0302 05/29/23 0226 05/29/23 0927 05/30/23 0542  AST 5,642* 2,062* 603* 600*  --  211*  ALT 738* 543* 244* 254*  --  203*  ALKPHOS 25* 23* 48 74  --  74  BILITOT 1.2 1.3* 3.2* 3.9* 3.6* 2.6*  PROT 3.2* 3.6* 5.9* 5.7*  --  5.5*  ALBUMIN 1.9* 1.9* 3.5 2.9*  --  2.6*   No results for input(s): "LIPASE", "AMYLASE" in the last 168 hours. Recent Labs  Lab 05/27/23 1514  AMMONIA 34    ABG    Component Value Date/Time   PHART 7.574 (H) 05/29/2023 0309   PCO2ART 33.7 05/29/2023 0309   PO2ART 96 05/29/2023 0309   HCO3 31.1 (H) 05/29/2023 0309   TCO2 32 05/29/2023 0309   ACIDBASEDEF 15.0 (H) 05/26/2023 0454   O2SAT 98 05/29/2023 0309     Coagulation Profile: Recent Labs  Lab 05/27/23 1917 05/28/23 0302 05/29/23 0226 05/29/23 1231 05/30/23 0542  INR 1.2 1.1 1.1 1.1 1.1    Cardiac Enzymes: Recent Labs  Lab 05/25/23 1532 05/26/23 1401 05/29/23 0927  CKTOTAL 237 3,654* 1,183*    HbA1C: Hgb A1c MFr Bld  Date/Time Value Ref Range Status  05/26/2023 02:01 PM 5.7 (H) 4.8 - 5.6 % Final    Comment:    (NOTE) Pre diabetes:          5.7%-6.4%  Diabetes:              >6.4%  Glycemic control for   <7.0% adults with diabetes   03/03/2022 08:42 AM <4.2 (L) 4.8 - 5.6 % Final    Comment:    (NOTE) **Verified by repeat analysis**         Prediabetes: 5.7 - 6.4         Diabetes: >6.4         Glycemic control for adults with diabetes: <7.0  CBG: Recent Labs  Lab 05/29/23 1129 05/29/23 1554 05/29/23 1949 05/29/23 2325 05/30/23 0353   GLUCAP 92 103* 106* 104* 115*    Review of Systems:   Negative Except what is stated in HPI   Past Medical History:  He,  has a past medical history of Acute encephalopathy (03/10/2022), Adenomatous polyp of descending colon, Asthma, Encephalopathy acute (03/01/2022), Hematochezia, HTN (hypertension), Hypokalemia (03/04/2022), Need for hepatitis C screening test (04/27/2022), and Seizures (HCC).   Surgical History:   Past Surgical History:  Procedure Laterality Date   COLONOSCOPY WITH PROPOFOL N/A 03/14/2022   Procedure: COLONOSCOPY WITH PROPOFOL;  Surgeon: Shellia Cleverly, DO;  Location: [facility] ENDOSCOPY;  Service: Gastroenterology;  Laterality: N/A;   POLYPECTOMY  03/14/2022   Procedure: POLYPECTOMY;  Surgeon: Shellia Cleverly, DO;  Location: [facility] ENDOSCOPY;  Service: Gastroenterology;;     Social History:   reports that he has been smoking cigarettes. He started smoking about 15 years ago. He has a 7.9 pack-year smoking history. He has never used smokeless tobacco. He reports current alcohol use of about 42.0 standard drinks of alcohol per week. He reports current drug use. Frequency: 7.00 times per week. Drug: Marijuana.   Family History:  His family history includes Cancer in his maternal grandfather; Hypertension in his mother.   Allergies Allergies  Allergen Reactions   Ibuprofen Anaphylaxis     Home Medications  Prior to Admission medications   Medication Sig Start Date End Date Taking? Authorizing Provider  acetaminophen (TYLENOL) 325 MG tablet Take 2 tablets (650 mg total) by mouth every 6 (six) hours as needed for mild pain or moderate pain. 03/30/22  Yes Littie Deeds, MD  albuterol (PROVENTIL) (2.5 MG/3ML) 0.083% nebulizer solution Take 3 mLs (2.5 mg total) by nebulization every 6 (six) hours as needed for wheezing or shortness of breath. 04/04/23  Yes Vonna Drafts, MD  amLODipine (NORVASC) 10 MG tablet Take 1 tablet (10 mg total) by mouth at bedtime. 05/22/23  Yes  Hindel, Leah, MD  divalproex (DEPAKOTE) 500 MG DR tablet Take 1 tablet (500 mg total) by mouth 2 (two) times daily. 04/04/23  Yes Vonna Drafts, MD  ferrous sulfate 324 (65 Fe) MG TBEC Take 1 tablet (325 mg total) by mouth daily at 12 noon. 04/04/23  Yes Vonna Drafts, MD  fluconazole (DIFLUCAN) 200 MG tablet Take 0.5 tablets (100 mg total) by mouth daily. 04/24/23  Yes Alwyn Ren, MD  folic acid (FOLVITE) 1 MG tablet TAKE 1 TABLET (1 MG TOTAL) BY MOUTH DAILY (AM) Patient taking differently: Take 1 mg by mouth daily. 04/04/23  Yes Vonna Drafts, MD  lisinopril (ZESTRIL) 5 MG tablet Take 1 tablet (5 mg total) by mouth at bedtime. 05/22/23  Yes Hindel, Leah, MD  Multiple Vitamin (MULTIVITAMIN WITH MINERALS) TABS tablet Take 1 tablet by mouth daily. 04/04/23  Yes Vonna Drafts, MD  naltrexone (DEPADE) 50 MG tablet TAKE 1/2 TABLET (25 MG TOTAL) BY MOUTH DAILY (AM) Patient taking differently: Take 25 mg by mouth daily. 04/04/23  Yes Vonna Drafts, MD  OLANZapine (ZYPREXA) 5 MG tablet Take 1 tablet (5 mg total) by mouth at bedtime. Future refills will need to come from psychiatry. 04/04/23  Yes Vonna Drafts, MD  pantoprazole (PROTONIX) 40 MG tablet TAKE 1 TABLET (40 MG TOTAL) BY MOUTH DAILY (AM) Patient taking differently: Take 40 mg by mouth daily. 04/04/23  Yes Mahmood, Unknown Foley, MD  polyethylene glycol (MIRALAX / GLYCOLAX) 17 g packet MIX AND DRINK BY MOUTH DAILY AS NEEDED  FOR MODERATE CONSTIPATION 04/04/23  Yes Vonna Drafts, MD  VENTOLIN HFA 108 (90 Base) MCG/ACT inhaler Inhale 1-2 puffs into the lungs every 6 (six) hours as needed for wheezing or shortness of breath. 04/04/23  Yes Vonna Drafts, MD  feeding supplement (ENSURE ENLIVE / ENSURE PLUS) LIQD Take 237 mLs by mouth 2 (two) times daily between meals. Patient not taking: Reported on 05/25/2023 04/24/23   Alwyn Ren, MD  fluticasone [facility]) 50 MCG/ACT nasal spray Place 2 sprays into both nostrils daily. Patient not taking: Reported on  05/25/2023 04/04/23   Vonna Drafts, MD  hydrocortisone (ANUSOL-HC) 2.5 % rectal cream Place 1 Application rectally 2 (two) times daily. Patient not taking: Reported on 05/25/2023 04/04/23   Vonna Drafts, MD  mometasone-formoterol [facility]) 100-5 MCG/ACT AERO INHALE TWO PUFFS INTO THE LUNGS TWICE A DAY Patient not taking: Reported on 05/25/2023 04/04/23   Vonna Drafts, MD  Pedialyte (PEDIALYTE) SOLN Take 240 mLs by mouth every 6 (six) hours. Patient not taking: Reported on 05/25/2023 04/23/23   Alwyn Ren, MD     Critical care time: 40 mins     Modena Slater, DO Internal Medicine Resident PGY-2 Pager: [phone]

## 2023-05-30 NOTE — Procedures (Signed)
Patient Name: Timothy Hubbard  MRN: [id number]  Epilepsy Attending: Charlsie Quest  Referring Physician/Provider: Lorin Glass, MD  Duration: 05/29/2023 2237 to 05/30/2023 2237   Patient history: 32 year old male with history of seizures, hypertension and asthma who presented after had witnessed seizure. EEG to evaluate for seizure   Level of alertness: comatose   AEDs during EEG study: LEV, versed   Technical aspects: This EEG study was done with scalp electrodes positioned according to the 10-20 International system of electrode placement. Electrical activity was reviewed with band pass filter of 1-70Hz , sensitivity of 7 uV/mm, display speed of 61mm/sec with a 60Hz  notched filter applied as appropriate. EEG data were recorded continuously and digitally stored.  Video monitoring was available and reviewed as appropriate.   Description: EEG showed continuous generalized 3 to 6 Hz theta-delta slowing. Hyperventilation and photic stimulation were not performed.      ABNORMALITY - Continuous slow, generalized   IMPRESSION: This study was suggestive of severe diffuse encephalopathy, likely related to sedation. No seizures or epileptiform discharges were seen throughout the recording.   Loni Delbridge [name]

## 2023-05-30 NOTE — Progress Notes (Signed)
ANTICOAGULATION CONSULT NOTE - Initial Consult  Pharmacy Consult for heparin Indication: DVT  Allergies  Allergen Reactions   Ibuprofen Anaphylaxis    Patient Measurements: Weight: 81.6 kg (179 lb 14.3 oz) Heparin Dosing Weight: 82 kg  Vital Signs: Temp: 99.1 F (37.3 C) (09/23 2000) Temp Source: Esophageal (09/23 2000) Pulse Rate: 101 (09/23 2000)  Labs: Recent Labs    05/27/23 1514 05/27/23 1722 05/27/23 1917 05/27/23 1925 05/27/23 1927 05/28/23 0302 05/28/23 0336 05/28/23 1053 05/28/23 1648 05/29/23 0226 05/29/23 0309 05/29/23 0927 05/29/23 1231 05/29/23 2339 05/29/23 2346  HGB  --    < > 9.5*  --    < > 10.0*   < > 9.8*   < > 9.4* 9.5*  --   --   --  9.4*  HCT  --    < > 27.5*  --    < > 28.6*   < > 27.9*   < > 26.9* 28.0*  --   --   --  27.5*  PLT  --    < > 59*  --   --  48*  46*  --  46*  --  30*  32*  --   --   --   --  24*  APTT  --    < > 37*  --   --  33  --   --   --  29  --   --  30  --   --   LABPROT  --    < > 15.5*  --   --  14.2  --   --   --  14.7  --   --  14.6  --   --   INR  --    < > 1.2  --   --  1.1  --   --   --  1.1  --   --  1.1  --   --   HEPARINUNFRC  --   --   --   --   --   --   --   --   --   --   --   --   --  <0.10*  --   CREATININE 3.27*  --   --  2.83*  --  3.30*  --   --   --  3.80*  --   --   --   --   --   CKTOTAL  --   --   --   --   --   --   --   --   --   --   --  1,183*  --   --   --   TROPONINIHS 210*  --  240*  --   --   --   --   --   --   --   --   --   --   --   --    < > = values in this interval not displayed.    Estimated Creatinine Clearance: 27.7 mL/min (A) (by C-G formula based on SCr of 3.8 mg/dL (H)).   Medical History: Past Medical History:  Diagnosis Date   Acute encephalopathy 03/10/2022   Adenomatous polyp of descending colon    Asthma    Encephalopathy acute 03/01/2022   Hematochezia    HTN (hypertension)    Hypokalemia 03/04/2022   Need for hepatitis C screening test 04/27/2022   Seizures  (HCC)  Assessment: 32 yo male who presented with cardiac arrest 2/2 to seizure. Patient now presenting with new DVT. Notable history of abdominopelvic hemorrhage on CT angio (9/21) with massive transfusion. aPTT/INR elevated on admission, now back to normal. Heme/onc consult pending, confirmed with CCM to go ahead and start heparin. Hgb 9.5, plt 30 (BL 300s), no signs of new bleeding noted. Pharmacy consulted to dose heparin in the setting of DVT.   HL <0.10 - undetectable and subtherapeutic  Hgb stable 9.5>9.4 and Plt persistently low at 24 now   Goal of Therapy:  Heparin level 0.3-0.5 units/ml Monitor platelets by anticoagulation protocol: Yes   Plan:  No bolus in setting of increased bleed risk with thrombocytopenia Increase heparin gtt to 1050 units/hr  6 hour heparin level  Daily CBC and heparin level while on heparin Monitor for s/sx of bleeding   Calton Dach, PharmD, BCCCP Clinical Pharmacist 05/30/2023 12:20 AM

## 2023-05-30 NOTE — Progress Notes (Signed)
Right Femoral Central Line removed.  Line pulled at 1528.  Pressure held for 20 minutes until 1548.   Vital signs stable throughout.   Right groin site level 0.   Gauze dressing applied.  Unable to educate patient due to patient on ventilator and not currently interactive with RN.

## 2023-05-30 NOTE — Progress Notes (Signed)
Neurology Progress Note   Subjective: Patient being weaned off sedation today.   Exam: Vitals:   05/30/23 1300 05/30/23 1324  BP: (!) 111/91   Pulse: (!) 101 99  Resp: 19 16  Temp: 98.6 F (37 C) 98.6 F (37 C)  SpO2: 100% 100%   Gen: In bed, intubated, he is currently being weaned off sedation, fentanyl and versed stopped.  Currently requiring multiple pressors.  Neuro: MS: He does not open eyes or follow commands  CN: Pupils are reactive and corneals are intact. Positive doll's eye reflex.  motor: Patient moving all 3 extremities to noxious stimuli except RUE.  Sensory: unable to assess  Pertinent Labs: pH: 7.574 Phos: 6.2 ->4.5 D-dimer: > 20.00 Platelets: 20 Cr: 4.38 Ca: 8.0 Albumin: 2.9 AST: 600 ALT: 254 Total Bili: 3.9 LA: 14.3 -> 9.0 -> 7.3  9/24 LTM Read: This study was suggestive of severe diffuse encephalopathy, likely related to sedation. No seizures or epileptiform discharges were seen throughout the recording.   Impression: 32 year old male with a history of epilepsy who is managed on Depakote who presented with breakthrough seizure. Patient was not sure if he had missed any doses, family states compliance. Depakote was changed to Keppra due to elevated LFTs. No seizures over night, but will continue with LTM EEG one more day to assess while patient is off sedation.  Recommendations: - Continue Keppra 500 mg twice daily - Continue LTM EEG for another 24 hours given weaning of sedation - Neurology will follow  Note by Lear Ng Student PA and Mauri Reading NP, edited by MD   Attending Neurohospitalist Addendum Patient seen and examined with APP/Resident. Agree with the history and physical as documented above. Agree with the plan as documented, which I helped formulate. I have edited the note above to reflect my full findings and recommendations. I have independently reviewed the chart, obtained history, review of systems and examined the patient.I  have personally reviewed pertinent head/neck/spine imaging (CT/MRI). Please feel free to call with any questions.  This patient is critically ill and at significant risk of neurological worsening, death and care requires constant monitoring of vital signs, hemodynamics,respiratory and cardiac monitoring, neurological assessment, discussion with family, other specialists and medical decision making of high complexity. I spent 40 minutes of neurocritical care time  in the care of  this patient. This was time spent independent of any time provided by nurse practitioner or PA.  Bing Neighbors, MD [facility] [phone]  If 7pm- 7am, please page neurology on call as listed in AMION.

## 2023-05-31 ENCOUNTER — Inpatient Hospital Stay (HOSPITAL_COMMUNITY): Payer: Medicare Other

## 2023-05-31 DIAGNOSIS — I469 Cardiac arrest, cause unspecified: Secondary | ICD-10-CM | POA: Diagnosis not present

## 2023-05-31 DIAGNOSIS — R569 Unspecified convulsions: Secondary | ICD-10-CM | POA: Diagnosis not present

## 2023-05-31 LAB — BPAM PLATELET PHERESIS
Blood Product Expiration Date: 202409252359
ISSUE DATE / TIME: 202409241304
Unit Type and Rh: 9500

## 2023-05-31 LAB — GLUCOSE, CAPILLARY
Glucose-Capillary: 100 mg/dL — ABNORMAL HIGH (ref 70–99)
Glucose-Capillary: 110 mg/dL — ABNORMAL HIGH (ref 70–99)
Glucose-Capillary: 113 mg/dL — ABNORMAL HIGH (ref 70–99)
Glucose-Capillary: 115 mg/dL — ABNORMAL HIGH (ref 70–99)
Glucose-Capillary: 119 mg/dL — ABNORMAL HIGH (ref 70–99)
Glucose-Capillary: 95 mg/dL (ref 70–99)

## 2023-05-31 LAB — CBC
HCT: 28.3 % — ABNORMAL LOW (ref 39.0–52.0)
HCT: 28.8 % — ABNORMAL LOW (ref 39.0–52.0)
Hemoglobin: 9.3 g/dL — ABNORMAL LOW (ref 13.0–17.0)
Hemoglobin: 9.4 g/dL — ABNORMAL LOW (ref 13.0–17.0)
MCH: 28.4 pg (ref 26.0–34.0)
MCH: 28.3 pg (ref 26.0–34.0)
MCHC: 32.9 g/dL (ref 30.0–36.0)
MCHC: 32.6 g/dL (ref 30.0–36.0)
MCV: 86.3 fL (ref 80.0–100.0)
MCV: 86.7 fL (ref 80.0–100.0)
Platelets: 46 10*3/uL — ABNORMAL LOW (ref 150–400)
Platelets: 55 10*3/uL — ABNORMAL LOW (ref 150–400)
RBC: 3.28 MIL/uL — ABNORMAL LOW (ref 4.22–5.81)
RBC: 3.32 MIL/uL — ABNORMAL LOW (ref 4.22–5.81)
RDW: 16.8 % — ABNORMAL HIGH (ref 11.5–15.5)
RDW: 16.7 % — ABNORMAL HIGH (ref 11.5–15.5)
WBC: 6.6 10*3/uL (ref 4.0–10.5)
WBC: 8 10*3/uL (ref 4.0–10.5)
nRBC: 0.8 % — ABNORMAL HIGH (ref 0.0–0.2)
nRBC: 0.5 % — ABNORMAL HIGH (ref 0.0–0.2)

## 2023-05-31 LAB — COMPREHENSIVE METABOLIC PANEL
ALT: 185 U/L — ABNORMAL HIGH (ref 0–44)
AST: 151 U/L — ABNORMAL HIGH (ref 15–41)
Albumin: 2.5 g/dL — ABNORMAL LOW (ref 3.5–5.0)
Alkaline Phosphatase: 84 U/L (ref 38–126)
Anion gap: 15 (ref 5–15)
BUN: 29 mg/dL — ABNORMAL HIGH (ref 6–20)
CO2: 31 mmol/L (ref 22–32)
Calcium: 8 mg/dL — ABNORMAL LOW (ref 8.9–10.3)
Chloride: 91 mmol/L — ABNORMAL LOW (ref 98–111)
Creatinine, Ser: 4.39 mg/dL — ABNORMAL HIGH (ref 0.61–1.24)
GFR, Estimated: 17 mL/min — ABNORMAL LOW (ref >60)
Glucose, Bld: 119 mg/dL — ABNORMAL HIGH (ref 70–99)
Potassium: 3.3 mmol/L — ABNORMAL LOW (ref 3.5–5.1)
Sodium: 137 mmol/L (ref 135–145)
Total Bilirubin: 2.1 mg/dL — ABNORMAL HIGH (ref 0.3–1.2)
Total Protein: 5.7 g/dL — ABNORMAL LOW (ref 6.5–8.1)

## 2023-05-31 LAB — C4 COMPLEMENT: Complement C4, Body Fluid: 10 mg/dL — ABNORMAL LOW (ref 12–38)

## 2023-05-31 LAB — BASIC METABOLIC PANEL
Anion gap: 15 (ref 5–15)
BUN: 29 mg/dL — ABNORMAL HIGH (ref 6–20)
CO2: 32 mmol/L (ref 22–32)
Calcium: 7.8 mg/dL — ABNORMAL LOW (ref 8.9–10.3)
Chloride: 91 mmol/L — ABNORMAL LOW (ref 98–111)
Creatinine, Ser: 4.54 mg/dL — ABNORMAL HIGH (ref 0.61–1.24)
GFR, Estimated: 17 mL/min — ABNORMAL LOW (ref >60)
Glucose, Bld: 104 mg/dL — ABNORMAL HIGH (ref 70–99)
Potassium: 3.8 mmol/L (ref 3.5–5.1)
Sodium: 138 mmol/L (ref 135–145)

## 2023-05-31 LAB — NEURON-SPECIFIC ENOLASE(NSE), BLOOD: Neuron-specific Enolase, Serum: 46.8 ng/mL — ABNORMAL HIGH (ref 0.0–17.6)

## 2023-05-31 LAB — PHOSPHORUS
Phosphorus: 4.1 mg/dL (ref 2.5–4.6)
Phosphorus: 3.5 mg/dL (ref 2.5–4.6)

## 2023-05-31 LAB — HEPARIN LEVEL (UNFRACTIONATED): Heparin Unfractionated: <0.1 IU/mL — ABNORMAL LOW (ref 0.30–0.70)

## 2023-05-31 LAB — PREPARE PLATELET PHERESIS: Unit division: 0

## 2023-05-31 LAB — MAGNESIUM
Magnesium: 2 mg/dL (ref 1.7–2.4)
Magnesium: 2 mg/dL (ref 1.7–2.4)

## 2023-05-31 LAB — COMPLEMENT, TOTAL: Compl, Total (CH50): 39 U/mL — ABNORMAL LOW (ref >41)

## 2023-05-31 LAB — TRIGLYCERIDES: Triglycerides: 66 mg/dL (ref <150)

## 2023-05-31 LAB — C3 COMPLEMENT: C3 Complement: 67 mg/dL — ABNORMAL LOW (ref 82–167)

## 2023-05-31 MED ORDER — FUROSEMIDE 10 MG/ML IJ SOLN
15.0000 mg/h | INTRAVENOUS | Status: DC
  Administered 2023-05-31 – 2023-06-02 (×3): 10 mg/h via INTRAVENOUS
  Administered 2023-06-02 – 2023-06-04 (×3): 15 mg/h via INTRAVENOUS
  Filled 2023-05-31 (×8): qty 20

## 2023-05-31 MED ORDER — HEPARIN (PORCINE) 25000 UT/250ML-% IV SOLN
1200.0000 [IU]/h | INTRAVENOUS | Status: DC
  Administered 2023-05-31: 1200 [IU]/h via INTRAVENOUS
  Filled 2023-05-31: qty 250

## 2023-05-31 MED ORDER — POTASSIUM CHLORIDE 20 MEQ PO PACK
40.0000 meq | PACK | Freq: Once | ORAL | Status: AC
  Administered 2023-05-31: 40 meq via ORAL
  Filled 2023-05-31: qty 2

## 2023-05-31 MED ORDER — FUROSEMIDE 10 MG/ML IJ SOLN
100.0000 mg | Freq: Once | INTRAVENOUS | Status: AC
  Administered 2023-05-31: 100 mg via INTRAVENOUS
  Filled 2023-05-31: qty 10

## 2023-05-31 MED ORDER — POTASSIUM CHLORIDE 10 MEQ/50ML IV SOLN
10.0000 meq | Freq: Once | INTRAVENOUS | Status: AC
  Administered 2023-05-31: 10 meq via INTRAVENOUS
  Filled 2023-05-31: qty 50

## 2023-05-31 MED ORDER — HEPARIN (PORCINE) 25000 UT/250ML-% IV SOLN
1600.0000 [IU]/h | INTRAVENOUS | Status: DC
  Administered 2023-06-01: 1600 [IU]/h via INTRAVENOUS
  Administered 2023-06-01: 1350 [IU]/h via INTRAVENOUS
  Filled 2023-05-31 (×2): qty 250

## 2023-05-31 NOTE — Progress Notes (Signed)
ANTICOAGULATION CONSULT NOTE  Pharmacy Consult for heparin Indication: DVT  Allergies  Allergen Reactions   Ibuprofen Anaphylaxis    Patient Measurements: Weight: 88.1 kg (194 lb 3.6 oz) Heparin Dosing Weight: 82 kg  Vital Signs: Temp: 98.6 F (37 C) (09/25 1400) Temp Source: Esophageal (09/25 0800) BP: 130/103 (09/25 1400) Pulse Rate: 95 (09/25 1400)  Labs: Recent Labs    05/29/23 0226 05/29/23 0309 05/29/23 0927 05/29/23 1231 05/29/23 2339 05/29/23 2346 05/30/23 0542 05/30/23 1220 05/31/23 0400 05/31/23 1653  HGB 9.4*   < >  --   --   --    < > 9.0*  --  9.3* 9.4*  HCT 26.9*   < >  --   --   --    < > 26.8*  --  28.3* 28.8*  PLT 30*  32*  --   --   --   --    < > 20*  23*  --  46* 55*  APTT 29  --   --  30  --   --  51*  --   --   --   LABPROT 14.7  --   --  14.6  --   --  14.4  --   --   --   INR 1.1  --   --  1.1  --   --  1.1  --   --   --   HEPARINUNFRC  --   --   --   --  <0.10*  --  <0.10*  --   --  <0.10*  CREATININE 3.80*  --   --   --   --   --  4.31* 4.38* 4.39*  --   CKTOTAL  --   --  1,183*  --   --   --   --   --   --   --    < > = values in this interval not displayed.    Estimated Creatinine Clearance: 24.9 mL/min (A) (by C-G formula based on SCr of 4.39 mg/dL (H)).   Assessment: 32 yo male who presented with cardiac arrest 2/2 to seizure. Patient now presenting with new DVT. Notable history of abdominopelvic hemorrhage on CT angio (9/21) with massive transfusion. Previously heparin gtt held 2/2 to thrombocytopenia (Plt 20s). Plt trending up, now in the 40s. Pharmacy consulted restart heparin in the setting of DVT.  Per heme/onc and CCM, okay to anticoagulate using heparin.   Heparin level undetectable on infusion at 1200 units/hr. No issues with gtt running. Hgb 9.4, plt 55 (trending up). No signs of bleeding per RN.   Goal of Therapy:  Heparin level 0.3-0.5 units/ml Monitor platelets by anticoagulation protocol: Yes   Plan:  No bolus in  setting of increased bleed risk with thrombocytopenia Increase heparin gtt to 1350 units/hr  F/u 8 hour heparin level   Thank you for involving pharmacy in the patient's care.   Christoper Fabian, PharmD, BCPS Please see amion for complete clinical pharmacist phone list 05/31/2023 5:50 PM

## 2023-05-31 NOTE — Progress Notes (Addendum)
Neurology Progress Note   Subjective: Patient seen in bed with eyes open and following people in the room. Patient completely weaned off of sedation today with improvement. Patient seen spontaneously moving left arm which is new from yesterday.   No seizures overnight.    Exam: Vitals:   05/31/23 0738 05/31/23 0800  BP:  (!) 125/97  Pulse: (!) 104 97  Resp: 20 17  Temp:  97.9 F (36.6 C)  SpO2:  100%   Gen: In bed, intubated, requiring no pressors at this time.  Neuro: MS: Patient opening their eyes and following simple commands.  CN: Pupils are reactive and corneals are intact. Positive doll's eye reflex.  Motor: Patient moving all 4 extremities to noxious stimuli and BUE to command. Able to lift LLE off the bed about an inch.  Sensory: unable to assess  Pertinent Labs: Phos: 4.1 AST: 600 -> 154 ALT: 254 -> 185 LA: 14.3 -> 3 Triglycerides: 326 -> 66 DIC Panel: PT 14.4, INR 1.1, aPTT 51, Fibrinogen 484, D-dimer > 20, platelets 20  CMP     Component Value Date/Time   NA 137 05/31/2023 0400   NA 139 05/22/2023 1213   K 3.3 (L) 05/31/2023 0400   CL 91 (L) 05/31/2023 0400   CO2 31 05/31/2023 0400   GLUCOSE 119 (H) 05/31/2023 0400   BUN 29 (H) 05/31/2023 0400   BUN 9 05/22/2023 1213   CREATININE 4.39 (H) 05/31/2023 0400   CREATININE 1.03 10/19/2016 1017   CALCIUM 8.0 (L) 05/31/2023 0400   PROT 5.7 (L) 05/31/2023 0400   PROT 5.8 (L) 02/13/2023 1725   ALBUMIN 2.5 (L) 05/31/2023 0400   ALBUMIN 3.5 (L) 02/13/2023 1725   AST 151 (H) 05/31/2023 0400   ALT 185 (H) 05/31/2023 0400   ALKPHOS 84 05/31/2023 0400   BILITOT 2.1 (H) 05/31/2023 0400   BILITOT <0.2 02/13/2023 1725   EGFR 128 05/22/2023 1213   GFRNONAA 17 (L) 05/31/2023 0400   GFRNONAA >89 10/19/2016 1017   CBC    Component Value Date/Time   WBC 6.6 05/31/2023 0400   RBC 3.28 (L) 05/31/2023 0400   HGB 9.3 (L) 05/31/2023 0400   HGB 12.7 (L) 04/04/2023 1040   HCT 28.3 (L) 05/31/2023 0400   HCT 29.3 (L)  05/25/2023 1532   PLT 46 (L) 05/31/2023 0400   PLT 235 04/04/2023 1040   MCV 86.3 05/31/2023 0400   MCV 95 04/04/2023 1040   MCH 28.4 05/31/2023 0400   MCHC 32.9 05/31/2023 0400   RDW 16.8 (H) 05/31/2023 0400   RDW 11.4 (L) 04/04/2023 1040   LYMPHSABS 1.4 05/28/2023 1053   LYMPHSABS 3.4 (H) 04/04/2023 1040   MONOABS 0.4 05/28/2023 1053   EOSABS 0.0 05/28/2023 1053   EOSABS 0.0 04/04/2023 1040   BASOSABS 0.1 05/28/2023 1053   BASOSABS 0.0 04/04/2023 1040   Imaging Reviewed:   CT HEAD WO CONTRAST Date of service: 05/27/2023 IMPRESSION: No acute intracranial process.  CT ANGIO ABD PELVIS W & WO CONTRAST Date of service: IMPRESSION: 1. Small caliber inflow/outflow vessels and visceral arteries most likely due to pressor effect. No focal stenosis, aneurysm or dissection. 2. No visible vascular leak. 3. Interval development of moderate right and small left layering pleural effusions with increased dense consolidation in both lower lobes, more so on the right, and patchy ground-glass infiltrate in the anterior lung bases consistent with pneumonia or edema. 4. Diffuse worsening body wall anasarca, probable fluid overload. 5. Interval development of a moderate-to-large volume of  fluid in the abdomen and pelvis with density of 42-46 Hounsfield units, worrisome for diffuse abdominopelvic hemorrhage. No visible bleeding source. 6. Diffusely thickened small bowel folds, most likely due to edema related to fluid overload. Enteritis not excluded. Ischemic etiology not strictly excluded but there is no pneumatosis or portal venous gas. 7. Moderate hepatic steatosis. 8. NGT passes into the stomach, loops around upward to the left with the tip in the proximal lumen. 9. Interval insertion of a right femoral central line with the tip in the mid right external iliac vein. 10. Subareolar gynecomastia asymmetrically on the right.  9/24-9/25 LTM EEG Read: This study was suggestive of moderate  diffuse encephalopathy. No seizures or epileptiform discharges were seen throughout the recording.    Impression:  Patient is a 32 year old male with a history of epilepsy who is managed on Depakote who presented with breakthrough seizure c/b cardiac arrest. Family states compliance with keppra. Depakote was changed to Keppra due to elevated LFTs. No seizures overnight with improved neuro exam.  Recommendations: - Continue Keppra 500 mg twice daily - Discontinue LTM EEG  - Neurology will sign off for now, please contact if any other questions, concerns  Bing Neighbors, MD [facility] [phone]  If 7pm- 7am, please page neurology on call as listed in AMION.

## 2023-05-31 NOTE — Progress Notes (Signed)
LTM EEG discontinued - no skin breakdown at unhook.   

## 2023-05-31 NOTE — Progress Notes (Signed)
I saw Timothy Hubbard this morning.  He is still intubated.  The nurse gave me a very good update.  Despite the peroneal thrombus, he is not on anticoagulation.  I understand this.  His platelet count is coming up slowly.  His white cell count is 8.  Hemoglobin 9.4.  Platelet count is now 55,000.  I think would be reasonable to get him back onto anticoagulation.  I think heparin would be safe.  I suspect that given his overall condition, he will have this clot progress.  Again, I think that every At a low therapeutic level would certainly be reasonable.  He does have progressive renal insufficiency.  His BUN is 29 creatinine 4.39.  His LFTs are improving.  His bilirubin is 2.1.  SGPT 185 SGOT 151.  As such, the "shock "liver does seem to be getting better.  I believe that the elevated D-dimer is reflective of his liver injury.  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  His fibrinogen yesterday was 484.  I noted that his ADAMTS-13 was on the lower side.  However, he had a very low level of ADAMTS_13 antibody.  Again, I suspect that the low level is secondary to his shock liver.  I do not believe that he has TTP.  I looked at his blood smear and did not see any schistocytes.  It is apparent that this to go to be a prolonged recovery.  Hopefully, he will be able to make some meaningful neurological recovery.  His vital signs show temperature of 96.  Pulse 85.  Blood pressure 118/94.  Overall, I really cannot find much change in the physical exam.  I really hate this for [name].  He is quite young.  I would probably go ahead and get him back on heparin.  I would not give him a bolus dose.  I just believe that given his current situation, he would be at risk for having this thrombus progress and potentially lead to pulmonary emboli.  I would probably recommend that if he were more active, that we could just follow this thrombus along with serial ultrasounds.  Again, I  just do not see that this is going to happen.  It is apparent that this is incredibly complicated.  I know that the ICU team is doing a fantastic job helping him.    Timothy Bach, MD  [name] 1:5

## 2023-05-31 NOTE — Progress Notes (Addendum)
NAME:  Timothy Hubbard, MRN:  [id number], DOB:  01-09-1991, LOS: 6 ADMISSION DATE:  05/25/2023, CONSULTATION DATE:  05/25/2023 REFERRING MD:  EDP, CHIEF COMPLAINT:  Seizures and cardiac arrest    History of Present Illness:  Patient is a [name] w/ pertinent PMH seizures, asthma, htn, mood disorder presents to [facility] ED on 9/19 w/ seizure and cardiac arrest.   Per family patient compliant w/ seizure medications.  Had a witnessed seizure at home. EMS arrived and patient unresponsive. On route to ED patient became bradycardic 20-30 and pea arrested. 1 round cpr performed. Patient breathing spontaneously post arrest. Started on epi drip. On ED arrival patient altered but withdrew to painful stimuli. During intubation patient pea arrested and rosc after 1 round of cpr. EKG sinus tachy 130s. Patient remained hypotensive post intubation and cvl placed. Currently on 3 pressors. Temp 92 F and placed on bair hugger. LA 14. Given IV fluids and cultures obtained and placed on broad spectrum antibiotics. CXR w/ ett in good position; no infiltrate appreciated. Abg 6.88, 56, 379, 11. Given 2 amps of bicarb and ordered bicarb drip. CTA head, abd, pelvis ordered/pending. PCCM consulted for icu admission.  Pertinent  Medical History  past medical history of Acute encephalopathy (03/10/2022), Adenomatous polyp of descending colon, Asthma, Encephalopathy acute (03/01/2022), Hematochezia, HTN (hypertension), Hypokalemia (03/04/2022), Need for hepatitis C screening test (04/27/2022), and Seizures (HCC)  Significant Hospital Events: Including procedures, antibiotic start and stop dates in addition to other pertinent events   9/19 admit to [facility]; seizure and cardiac arrest 9/20 extubated 05/27/2023: PEA arrest and after woke up and was confused.  Total of 2 rounds of CPR with epi and bicarb x 1.  But became bradycardic > intubated.  Had A-line placed.  Also central line ascites tap did show blood.  Was given 4 units PRBC, 4 units  FFP and 2 units platelets and 1 unit of cryo major transfusion protocol Cc consult for hemoperitoneum.  DIC panel positive.  He had some oozing from mouth and IV sites EEG showed symmetric low voltage no/seizure activity  Interim History / Subjective:  Overnight events: No acute events overnight.    Patient evaluated bedside this morning.  He is still intubated.  Weaned off sedation.  He is able to awake to some voice.  Not following much instruction.  Not able to participate in conversation.  Objective   Blood pressure (!) 125/97, pulse 97, temperature 97.9 F (36.6 C), temperature source Esophageal, resp. rate 17, weight 88.1 kg, SpO2 100%.    Vent Mode: PRVC FiO2 (%):  [40 %] 40 % Set Rate:  [14 bmp] 14 bmp Vt Set:  [470 mL] 470 mL PEEP:  [5 cmH20] 5 cmH20 Plateau Pressure:  [14 cmH20-28 cmH20] 28 cmH20   Intake/Output Summary (Last 24 hours) at 05/31/2023 1039 Last data filed at 05/31/2023 4098 Gross per 24 hour  Intake 2965.91 ml  Output 932 ml  Net 2033.91 ml   Filed Weights   05/29/23 0500 05/30/23 0408 05/31/23 0422  Weight: 81.6 kg 87.3 kg 88.1 kg    Examination: General: Intubated, sedated, acute distress HENT: Normocephalic, atraumatic, ET tube in place, OG tube in place Lungs: Decreased lung sounds bilaterally Cardiovascular: Tachycardic rate,no murmurs, rubs, or gallops Abdomen: Abdomen soft, nontender Extremities: Bilateral lower extremity swelling appreciated, 2+ pitting edema bilaterally Neuro: Intubated and sedated GU: Foley in place, increased urine output, dark urine  Resolved Hospital Problem list   #Lactic acidosis  Assessment & Plan:  This is a 32 year old male with a past medical history of seizures, asthma, hypertension who presents to the ED for concerns of seizure and had out-of-hospital cardiac arrest.   #Out-of-hospital cardiac arrest #Acute hypoxemic respiratory failure on mechanical ventilation Patient is slowly waking up.  Able to have a  little bit more movement, but not any purposeful movement on my exam today.  He still remains mechanically ventilated.  As he starts to wake up and wean off sedation he will have more idea of if he can have purposeful movements to potentially extubate.  Concerned about extubation could be potentially volume overload.  Will start him on Lasix drip today to see if we can try to get some volume off and him.   -Wean off sedation today  -VAP bundle  -Try to assess patient off of sedation to see his neurologic function   #Sepsis Patient has been afebrile.  Will treat as infection at this time.  Today will be day 3 of Unasyn total day 6 of total antibiotics.  Will have 1 more day of antibiotics. -Continue Unasyn day 3, day 6 of total antibiotics -Continue to follow cultures  #Acute right peroneal vein DVT Did stop heparin drip yesterday as platelet count decreased below 30.  Did have 1 packed platelets and platelet count improved to greater than 30 into the 40s.  Restart heparin today.  On exam, no signs of bleeding.  Will keep close eye on this.  Hemoglobin stable at 9.3.  -Continue to trend CBC and platelet count -Transfuse platelet if platelet drops below 10, but if patient does start bleeding, transfuse if platelets less than 20 -Restart heparin, can cut off with CBC shows platelet below 30 -Trend CBC  #Seizure disorder No further seizures.  Is off of Versed.  Continue Keppra 500 mg twice daily.  Can discontinue long-term EEG today.   -Neurology following -Continue Keppra 500 mg twice daily -Wean off sedation today -Long-term EEG   #Shock liver #Anemia of critical illness #Hemoperitoneum #Thrombocytopenia This is slowly improving.  Platelet count is slowly improving.  Patient did have 1 packed platelets transfused yesterday.  No further concern for bleeding at this time.  Hemoglobin stable at 9.3.  Anticipate that likely platelet should improve slowly with improvement of liver  function. -Hemoglobin 9.3 today -Hematology following, appreciate recommendations -Coagulation studies within normal limits -Will monitor for further bleeding episodes -Transfuse platelet if platelet drops below 10, but if patient does start bleeding, transfuse if platelets less than 20 -Restarted heparin today, can stop if platelets drop below 30  #HFrEF Patient is volume overloaded today.  Patient does have decreased ejection fraction with echo showing left ventricular function at 20 to 25%.  This is likely secondary to stunned myocardium.  Patient is very volume overloaded, and seems to tolerate diuresing yesterday.  Will start Lasix drip today.   -Start Lasix drip -Need to hold GDMT in setting of acute kidney injury -Need to hold GDMT as hypotension will not allow as well -Strict I's and O's  #Right pleural effusion Patient is still mechanically ventilated.  Patient has decreased breath sounds bilaterally.  Likely will need thoracentesis later, but platelet count will not allow this at this time.  Will start Lasix drip to see if they could potentially help with diuresing patient to avoid thoracentesis. -Start Lasix drip -Repeat chest x-ray tomorrow  #Acute renal failure Creatinine stable this morning at 4.39.  Tolerated Lasix well with output of 1 L.  Will start Lasix drip  today.  -Nephrology following, appreciate recommendations -Acute renal failure likely in setting of rhabdo plus ATN -Stable at this time, will start Lasix drip  Best Practice (right click and "Reselect all SmartList Selections" daily)   Diet/type: tubefeeds DVT prophylaxis: Systemic heparin GI prophylaxis: PPI Lines: N/A and Central line Foley:  Yes, and it is still needed Code Status:  full code  Labs   CBC: Recent Labs  Lab 05/25/23 1404 05/25/23 1414 05/26/23 1401 05/26/23 1404 05/27/23 1728 05/27/23 1917 05/28/23 1053 05/28/23 1648 05/29/23 0226 05/29/23 0309 05/29/23 2346 05/30/23 0542  05/31/23 0400  WBC 8.0   < > 5.5   < > 5.0   < > 8.6  --  8.1  --  7.0 6.5 6.6  NEUTROABS 4.8  --  3.1  --  3.2  --  6.8  --   --   --   --   --   --   HGB 11.3*   < > 6.4*   < > 10.0*   < > 9.8*   < > 9.4* 9.5* 9.4* 9.0* 9.3*  HCT 37.3*   < > 18.8*   < > 29.0*   < > 27.9*   < > 26.9* 28.0* 27.5* 26.8* 28.3*  MCV 109.4*   < > 90.4   < > 81.2   < > 81.1  --  82.8  --  85.4 85.6 86.3  PLT 122*   < > 32*   < > 44*  43*   < > 46*  --  30*  32*  --  24* 20*  23* 46*   < > = values in this interval not displayed.    Basic Metabolic Panel: Recent Labs  Lab 05/28/23 0302 05/28/23 0336 05/29/23 0226 05/29/23 0309 05/29/23 1627 05/30/23 0542 05/30/23 1220 05/30/23 1733 05/31/23 0400  NA 133*   < > 135 133*  --  136 136  --  137  K 4.4   < > 3.5 3.6  --  3.6 3.5  --  3.3*  CL 83*  --  88*  --   --  86* 90*  --  91*  CO2 28  --  26  --   --  30 29  --  31  GLUCOSE 108*  --  130*  --   --  115* 111*  --  119*  BUN 18  --  19  --   --  23* 25*  --  29*  CREATININE 3.30*  --  3.80*  --   --  4.31* 4.38*  --  4.39*  CALCIUM 7.3*  --  8.0*  --   --  7.9* 8.0*  --  8.0*  MG  --   --  1.9  --  2.4 2.2  --  2.2 2.0  PHOS  --   --  6.2*  --  5.2* 4.5  --  4.4 4.1   < > = values in this interval not displayed.   GFR: Estimated Creatinine Clearance: 24.9 mL/min (A) (by C-G formula based on SCr of 4.39 mg/dL (H)). Recent Labs  Lab 05/25/23 1532 05/26/23 0128 05/26/23 1401 05/26/23 2316 05/27/23 0034 05/27/23 0408 05/27/23 1514 05/27/23 1728 05/27/23 2122 05/28/23 0302 05/29/23 0226 05/29/23 2346 05/30/23 0542 05/31/23 0400  PROCALCITON 0.60  --   --   --   --   --   --   --   --   --   --   --   --   --  WBC  --    < > 5.5   < >  --    < >  --    < >  --    < > 8.1 7.0 6.5 6.6  LATICACIDVEN  --    < > >9.0*  --  7.3*  --  >9.0*  --  3.0*  --   --   --   --   --    < > = values in this interval not displayed.    Liver Function Tests: Recent Labs  Lab 05/27/23 0408  05/28/23 0302 05/29/23 0226 05/29/23 0927 05/30/23 0542 05/31/23 0400  AST 2,062* 603* 600*  --  211* 151*  ALT 543* 244* 254*  --  203* 185*  ALKPHOS 23* 48 74  --  74 84  BILITOT 1.3* 3.2* 3.9* 3.6* 2.6* 2.1*  PROT 3.6* 5.9* 5.7*  --  5.5* 5.7*  ALBUMIN 1.9* 3.5 2.9*  --  2.6* 2.5*   No results for input(s): "LIPASE", "AMYLASE" in the last 168 hours. Recent Labs  Lab 05/27/23 1514  AMMONIA 34    ABG    Component Value Date/Time   PHART 7.574 (H) 05/29/2023 0309   PCO2ART 33.7 05/29/2023 0309   PO2ART 96 05/29/2023 0309   HCO3 31.1 (H) 05/29/2023 0309   TCO2 32 05/29/2023 0309   ACIDBASEDEF 15.0 (H) 05/26/2023 0454   O2SAT 98 05/29/2023 0309     Coagulation Profile: Recent Labs  Lab 05/27/23 1917 05/28/23 0302 05/29/23 0226 05/29/23 1231 05/30/23 0542  INR 1.2 1.1 1.1 1.1 1.1    Cardiac Enzymes: Recent Labs  Lab 05/25/23 1532 05/26/23 1401 05/29/23 0927  CKTOTAL 237 3,654* 1,183*    HbA1C: Hgb A1c MFr Bld  Date/Time Value Ref Range Status  05/26/2023 02:01 PM 5.7 (H) 4.8 - 5.6 % Final    Comment:    (NOTE) Pre diabetes:          5.7%-6.4%  Diabetes:              >6.4%  Glycemic control for   <7.0% adults with diabetes   03/03/2022 08:42 AM <4.2 (L) 4.8 - 5.6 % Final    Comment:    (NOTE) **Verified by repeat analysis**         Prediabetes: 5.7 - 6.4         Diabetes: >6.4         Glycemic control for adults with diabetes: <7.0     CBG: Recent Labs  Lab 05/30/23 1515 05/30/23 1920 05/30/23 2324 05/31/23 0312 05/31/23 0726  GLUCAP 100* 93 82 113* 110*    Review of Systems:   Negative Except what is stated in HPI   Past Medical History:  He,  has a past medical history of Acute encephalopathy (03/10/2022), Adenomatous polyp of descending colon, Asthma, Encephalopathy acute (03/01/2022), Hematochezia, HTN (hypertension), Hypokalemia (03/04/2022), Need for hepatitis C screening test (04/27/2022), and Seizures (HCC).   Surgical  History:   Past Surgical History:  Procedure Laterality Date   COLONOSCOPY WITH PROPOFOL N/A 03/14/2022   Procedure: COLONOSCOPY WITH PROPOFOL;  Surgeon: Shellia Cleverly, DO;  Location: [facility] ENDOSCOPY;  Service: Gastroenterology;  Laterality: N/A;   POLYPECTOMY  03/14/2022   Procedure: POLYPECTOMY;  Surgeon: Shellia Cleverly, DO;  Location: [facility] ENDOSCOPY;  Service: Gastroenterology;;     Social History:   reports that he has been smoking cigarettes. He started smoking about 15 years ago. He has a 7.9 pack-year smoking history. He  has never used smokeless tobacco. He reports current alcohol use of about 42.0 standard drinks of alcohol per week. He reports current drug use. Frequency: 7.00 times per week. Drug: Marijuana.   Family History:  His family history includes Cancer in his maternal grandfather; Hypertension in his mother.   Allergies Allergies  Allergen Reactions   Ibuprofen Anaphylaxis     Home Medications  Prior to Admission medications   Medication Sig Start Date End Date Taking? Authorizing Provider  acetaminophen (TYLENOL) 325 MG tablet Take 2 tablets (650 mg total) by mouth every 6 (six) hours as needed for mild pain or moderate pain. 03/30/22  Yes Littie Deeds, MD  albuterol (PROVENTIL) (2.5 MG/3ML) 0.083% nebulizer solution Take 3 mLs (2.5 mg total) by nebulization every 6 (six) hours as needed for wheezing or shortness of breath. 04/04/23  Yes Vonna Drafts, MD  amLODipine (NORVASC) 10 MG tablet Take 1 tablet (10 mg total) by mouth at bedtime. 05/22/23  Yes Hindel, Leah, MD  divalproex (DEPAKOTE) 500 MG DR tablet Take 1 tablet (500 mg total) by mouth 2 (two) times daily. 04/04/23  Yes Vonna Drafts, MD  ferrous sulfate 324 (65 Fe) MG TBEC Take 1 tablet (325 mg total) by mouth daily at 12 noon. 04/04/23  Yes Vonna Drafts, MD  fluconazole (DIFLUCAN) 200 MG tablet Take 0.5 tablets (100 mg total) by mouth daily. 04/24/23  Yes Alwyn Ren, MD  folic acid (FOLVITE) 1 MG  tablet TAKE 1 TABLET (1 MG TOTAL) BY MOUTH DAILY (AM) Patient taking differently: Take 1 mg by mouth daily. 04/04/23  Yes Vonna Drafts, MD  lisinopril (ZESTRIL) 5 MG tablet Take 1 tablet (5 mg total) by mouth at bedtime. 05/22/23  Yes Hindel, Leah, MD  Multiple Vitamin (MULTIVITAMIN WITH MINERALS) TABS tablet Take 1 tablet by mouth daily. 04/04/23  Yes Vonna Drafts, MD  naltrexone (DEPADE) 50 MG tablet TAKE 1/2 TABLET (25 MG TOTAL) BY MOUTH DAILY (AM) Patient taking differently: Take 25 mg by mouth daily. 04/04/23  Yes Vonna Drafts, MD  OLANZapine (ZYPREXA) 5 MG tablet Take 1 tablet (5 mg total) by mouth at bedtime. Future refills will need to come from psychiatry. 04/04/23  Yes Vonna Drafts, MD  pantoprazole (PROTONIX) 40 MG tablet TAKE 1 TABLET (40 MG TOTAL) BY MOUTH DAILY (AM) Patient taking differently: Take 40 mg by mouth daily. 04/04/23  Yes Mahmood, Unknown Foley, MD  polyethylene glycol (MIRALAX / GLYCOLAX) 17 g packet MIX AND DRINK BY MOUTH DAILY AS NEEDED FOR MODERATE CONSTIPATION 04/04/23  Yes Vonna Drafts, MD  VENTOLIN HFA 108 (90 Base) MCG/ACT inhaler Inhale 1-2 puffs into the lungs every 6 (six) hours as needed for wheezing or shortness of breath. 04/04/23  Yes Vonna Drafts, MD  feeding supplement (ENSURE ENLIVE / ENSURE PLUS) LIQD Take 237 mLs by mouth 2 (two) times daily between meals. Patient not taking: Reported on 05/25/2023 04/24/23   Alwyn Ren, MD  fluticasone [facility]) 50 MCG/ACT nasal spray Place 2 sprays into both nostrils daily. Patient not taking: Reported on 05/25/2023 04/04/23   Vonna Drafts, MD  hydrocortisone (ANUSOL-HC) 2.5 % rectal cream Place 1 Application rectally 2 (two) times daily. Patient not taking: Reported on 05/25/2023 04/04/23   Vonna Drafts, MD  mometasone-formoterol [facility]) 100-5 MCG/ACT AERO INHALE TWO PUFFS INTO THE LUNGS TWICE A DAY Patient not taking: Reported on 05/25/2023 04/04/23   Vonna Drafts, MD  Pedialyte (PEDIALYTE) SOLN Take 240 mLs by mouth  every 6 (six) hours. Patient not taking: Reported on  05/25/2023 04/23/23   Alwyn Ren, MD     Critical care time: 40 mins     Modena Slater, DO Internal Medicine Resident PGY-2 Pager: [phone]

## 2023-05-31 NOTE — Procedures (Addendum)
Patient Name: KAPTAIN CRUISE  MRN: [id number]  Epilepsy Attending: Charlsie Quest  Referring Physician/Provider: Lorin Glass, MD  Duration: 05/30/2023 2237 to 05/31/2023 1022   Patient history: 32 year old male with history of seizures, hypertension and asthma who presented after had witnessed seizure. EEG to evaluate for seizure   Level of alertness: comatose-->awake, asleep   AEDs during EEG study: LEV   Technical aspects: This EEG study was done with scalp electrodes positioned according to the 10-20 International system of electrode placement. Electrical activity was reviewed with band pass filter of 1-70Hz , sensitivity of 7 uV/mm, display speed of 61mm/sec with a 60Hz  notched filter applied as appropriate. EEG data were recorded continuously and digitally stored.  Video monitoring was available and reviewed as appropriate.   Description: EEG initially showed continuous generalized 3 to 6 Hz theta-delta slowing. Gradually EEG improved and showed posterior dominant rhythm of 8-9 Hz activity of moderate voltage (25-35 uV) seen predominantly in posterior head regions, symmetric and reactive to eye opening and eye closing. Sleep was characterized by vertex waves, sleep spindles (12 to 14 Hz), maximal frontocentral region. EEG also showed intermittent generalized 3 to 6 Hz theta-delta slowing. Hyperventilation and photic stimulation were not performed.     ABNORMALITY - Intermittent slow, generalized   IMPRESSION: This study was suggestive of moderate diffuse encephalopathy. No seizures or epileptiform discharges were seen throughout the recording.   Anmarie Fukushima [name]

## 2023-05-31 NOTE — Progress Notes (Signed)
ANTICOAGULATION CONSULT NOTE - Initial Consult  Pharmacy Consult for heparin Indication: DVT  Allergies  Allergen Reactions   Ibuprofen Anaphylaxis    Patient Measurements: Weight: 88.1 kg (194 lb 3.6 oz) Heparin Dosing Weight: 82 kg  Vital Signs: Temp: 97 F (36.1 C) (09/25 0600) Temp Source: Esophageal (09/25 0400) BP: 111/93 (09/25 0600) Pulse Rate: 104 (09/25 0738)  Labs: Recent Labs    05/29/23 0226 05/29/23 0309 05/29/23 0927 05/29/23 1231 05/29/23 2339 05/29/23 2346 05/30/23 0542 05/30/23 1220 05/31/23 0400  HGB 9.4*   < >  --   --   --  9.4* 9.0*  --  9.3*  HCT 26.9*   < >  --   --   --  27.5* 26.8*  --  28.3*  PLT 30*  32*  --   --   --   --  24* 20*  23*  --  46*  APTT 29  --   --  30  --   --  51*  --   --   LABPROT 14.7  --   --  14.6  --   --  14.4  --   --   INR 1.1  --   --  1.1  --   --  1.1  --   --   HEPARINUNFRC  --   --   --   --  <0.10*  --  <0.10*  --   --   CREATININE 3.80*  --   --   --   --   --  4.31* 4.38* 4.39*  CKTOTAL  --   --  1,183*  --   --   --   --   --   --    < > = values in this interval not displayed.    Estimated Creatinine Clearance: 24.9 mL/min (A) (by C-G formula based on SCr of 4.39 mg/dL (H)).   Medical History: Past Medical History:  Diagnosis Date   Acute encephalopathy 03/10/2022   Adenomatous polyp of descending colon    Asthma    Encephalopathy acute 03/01/2022   Hematochezia    HTN (hypertension)    Hypokalemia 03/04/2022   Need for hepatitis C screening test 04/27/2022   Seizures [facility])      Assessment: 32 yo male who presented with cardiac arrest 2/2 to seizure. Patient now presenting with new DVT. Notable history of abdominopelvic hemorrhage on CT angio (9/21) with massive transfusion. Previously heparin gtt held 2/2 to thrombocytopenia (Plt 20s). Plt trending up, now in the 40s. Pharmacy consulted restart heparin in the setting of DVT.  Per heme/onc and CCM, okay to anticoagulate using heparin.    Hgb: 9.3, plt 46 (trending up). No signs of bleeding per RN.   Goal of Therapy:  Heparin level 0.3-0.5 units/ml Monitor platelets by anticoagulation protocol: Yes   Plan:  No bolus in setting of increased bleed risk with thrombocytopenia Restart heparin gtt at 1200 units/hr  6 hour heparin level and CBC  Daily CBC and heparin level while on heparin Monitor for s/sx of bleeding   Thank you for involving pharmacy in the patient's care.   Theotis Burrow, PharmD PGY1 Acute Care Pharmacy Resident  05/31/2023 7:44 AM

## 2023-05-31 NOTE — Progress Notes (Signed)
LTM maint complete - no skin breakdown under: Fp1 F3 F7 Serviced Cz and T7 [facility] monitored, Event button test confirmed by [facility].

## 2023-05-31 NOTE — Consult Note (Signed)
[facility] Resident Progress Note  Reason for Consult: AKI Referring Physician: Lynnell Catalan, MD  HPI: Hospital day 6.  32 y.o. with history of seizures, hypertension, and asthma presented for seizures complicated by PEA cardiac arrest x 2, approximately 3 minutes total downtime. Admitted to ICU for treatment of shock of mixed etiology, cardiogenic and septic due to aspiration pneumonia. Improved to extubation but suffered 3rd brief PEA arrest and re intubated. Now with oliguric AKI. Hospital course complicated by hemoperitoneum and acute anemia in setting of DIC. Also with DVT in RLE. Now off of norepinephrine and vasopressin.   Remains intubated, tolerating wake-up trial today. Note furosemide 120 mg IV administered with 900 UOP in interval.  Trend in Creatinine: Creat  Date/Time Value Ref Range Status  10/19/2016 10:17 AM 1.03 0.60 - 1.35 mg/dL Final  10/62/6948 54:62 PM 0.89 0.60 - 1.35 mg/dL Final   Creatinine, Ser  Date/Time Value Ref Range Status  05/31/2023 04:00 AM 4.39 (H) 0.61 - 1.24 mg/dL Final  70/35/0093 81:82 PM 4.38 (H) 0.61 - 1.24 mg/dL Final  99/37/1696 78:93 AM 4.31 (H) 0.61 - 1.24 mg/dL Final  81/09/7508 25:85 AM 3.80 (H) 0.61 - 1.24 mg/dL Final  27/78/2423 53:61 AM 3.30 (H) 0.61 - 1.24 mg/dL Final  44/31/5400 86:76 PM 2.83 (H) 0.61 - 1.24 mg/dL Final  19/50/9326 71:24 PM 3.27 (H) 0.61 - 1.24 mg/dL Final  58/05/9832 82:50 AM 3.03 (H) 0.61 - 1.24 mg/dL Final  53/97/6734 19:37 AM 2.80 (H) 0.61 - 1.24 mg/dL Final    Comment:    REPEATED TO VERIFY  05/25/2023 02:14 PM 2.00 (H) 0.61 - 1.24 mg/dL Final  90/24/0973 53:29 PM 2.38 (H) 0.61 - 1.24 mg/dL Final  92/42/6834 19:62 PM 0.64 (L) 0.76 - 1.27 mg/dL Final  22/97/9892 11:94 AM 0.61 0.61 - 1.24 mg/dL Final  17/40/8144 81:85 PM 0.60 (L) 0.61 - 1.24 mg/dL Final  63/14/9702 63:78 PM 0.53 (L) 0.76 - 1.27 mg/dL Final  58/85/0277 41:28 AM 0.80 0.76 - 1.27 mg/dL Final  78/67/6720 94:70 AM 0.59 (L) 0.61  - 1.24 mg/dL Final  96/28/3662 94:76 AM 0.56 (L) 0.61 - 1.24 mg/dL Final  54/65/0354 65:68 AM 0.54 (L) 0.61 - 1.24 mg/dL Final  12/75/1700 17:49 AM 0.66 0.61 - 1.24 mg/dL Final  44/96/7591 63:84 AM 0.63 0.61 - 1.24 mg/dL Final  66/59/9357 01:77 AM 0.68 0.61 - 1.24 mg/dL Final  93/90/3009 23:30 AM 0.66 0.61 - 1.24 mg/dL Final  07/62/2633 35:45 AM 0.67 0.61 - 1.24 mg/dL Final  62/56/3893 73:42 AM 0.70 0.61 - 1.24 mg/dL Final  87/68/1157 26:20 AM 0.83 0.61 - 1.24 mg/dL Final  35/59/7416 38:45 PM 1.29 (H) 0.61 - 1.24 mg/dL Final  36/46/8032 12:24 AM 0.81 0.76 - 1.27 mg/dL Final  82/50/0370 48:88 PM 0.65 0.40 - 1.50 mg/dL Final   Inpatient medications:  arformoterol  15 mcg Nebulization BID   budesonide (PULMICORT) nebulizer solution  0.5 mg Nebulization BID   Chlorhexidine Gluconate Cloth  6 each Topical Daily   insulin aspart  0-9 Units Subcutaneous Q4H   levETIRAcetam  500 mg Per Tube BID   mouth rinse  15 mL Mouth Rinse Q2H   pantoprazole (PROTONIX) IV  40 mg Intravenous Q12H   sodium chloride flush  10-40 mL Intracatheter Q12H   thiamine  100 mg Per Tube Daily   Weight change: 0.8 kg  Intake/Output Summary (Last 24 hours) at 05/31/2023 0854 Last data filed at 05/31/2023 0600 Gross per 24 hour  Intake 2859.47 ml  Output 902 ml  Net 1957.47 ml   BP (!) 111/93   Pulse (!) 104   Temp (!) 97 F (36.1 C)   Resp 20   Wt 88.1 kg   SpO2 100%   BMI 31.35 kg/m  Vitals:   05/31/23 0432 05/31/23 0500 05/31/23 0600 05/31/23 0738  BP:  (!) 122/93 (!) 111/93   Pulse:  94 90 (!) 104  Resp:  (!) 22 19 20   Temp:  (!) 96.8 F (36 C) (!) 97 F (36.1 C)   TempSrc:      SpO2: 100% 100% 100%   Weight:         Intubated, stirring in bed Tachycardia, regular rhythm, strong peripheral pulse Ventilated at 14 bpm 40% peep 5, crackles left anterior lung field Skin is warm and dry, flushed Anasarca Alert, following simple instructions Foley in place draining yellow urine, RIJ  CVC  Labs: Basic Metabolic Panel: Recent Labs  Lab 05/25/23 1404 05/25/23 1414 05/26/23 0507 05/26/23 1401 05/27/23 0408 05/27/23 1503 05/27/23 1514 05/27/23 1722 05/27/23 1925 05/27/23 1927 05/28/23 0302 05/28/23 0336 05/28/23 0616 05/28/23 1648 05/29/23 0226 05/29/23 0309 05/29/23 1627 05/30/23 0542 05/30/23 1220 05/30/23 1733 05/31/23 0400  NA 140   < > 134*  --  131*   < > 137   < > 135   < > 133*   < > 132* 132* 135 133*  --  136 136  --  137  K 4.5   < > 2.7*  --  3.6   < > 4.1   < > 3.9   < > 4.4   < > 4.2 3.9 3.5 3.6  --  3.6 3.5  --  3.3*  CL 101   < > 93*  --  84*  --  83*  --  85*  --  83*  --   --   --  88*  --   --  86* 90*  --  91*  CO2 8*  --  12*  --  34*  --  27  --  30  --  28  --   --   --  26  --   --  30 29  --  31  GLUCOSE 144*   < > 431*  --  166*  --  112*  --  131*  --  108*  --   --   --  130*  --   --  115* 111*  --  119*  BUN 11   < > 13  --  16  --  18  --  16  --  18  --   --   --  19  --   --  23* 25*  --  29*  CREATININE 2.38*   < > 2.80*  --  3.03*  --  3.27*  --  2.83*  --  3.30*  --   --   --  3.80*  --   --  4.31* 4.38*  --  4.39*  ALBUMIN 2.1*  --   --  1.9* 1.9*  --   --   --   --   --  3.5  --   --   --  2.9*  --   --  2.6*  --   --  2.5*  CALCIUM 7.2*  --  6.3*  --  5.9*  --  6.6*  --  7.8*  --  7.3*  --   --   --  8.0*  --   --  7.9* 8.0*  --  8.0*  PHOS  --   --  7.7*  --   --   --   --   --   --   --   --   --   --   --  6.2*  --  5.2* 4.5  --  4.4 4.1   < > = values in this interval not displayed.   Liver Function Tests: Recent Labs  Lab 05/29/23 0226 05/29/23 0927 05/30/23 0542 05/31/23 0400  AST 600*  --  211* 151*  ALT 254*  --  203* 185*  ALKPHOS 74  --  74 84  BILITOT 3.9* 3.6* 2.6* 2.1*  PROT 5.7*  --  5.5* 5.7*  ALBUMIN 2.9*  --  2.6* 2.5*   No results for input(s): "LIPASE", "AMYLASE" in the last 168 hours. Recent Labs  Lab 05/27/23 1514  AMMONIA 34   CBC: Recent Labs  Lab 05/25/23 1404 05/25/23 1414  05/26/23 1401 05/26/23 1404 05/27/23 1728 05/27/23 1917 05/28/23 1053 05/28/23 1648 05/29/23 0226 05/29/23 0309 05/29/23 2346 05/30/23 0542 05/31/23 0400  WBC 8.0   < > 5.5   < > 5.0   < > 8.6  --  8.1  --  7.0 6.5 6.6  NEUTROABS 4.8  --  3.1  --  3.2  --  6.8  --   --   --   --   --   --   HGB 11.3*   < > 6.4*   < > 10.0*   < > 9.8*   < > 9.4* 9.5* 9.4* 9.0* 9.3*  HCT 37.3*   < > 18.8*   < > 29.0*   < > 27.9*   < > 26.9* 28.0* 27.5* 26.8* 28.3*  MCV 109.4*   < > 90.4   < > 81.2   < > 81.1  --  82.8  --  85.4 85.6 86.3  PLT 122*   < > 32*   < > 44*  43*   < > 46*  --  30*  32*  --  24* 20*  23* 46*   < > = values in this interval not displayed.   PT/INR: Recent Labs  Lab 05/27/23 1917 05/28/23 0302 05/29/23 0226 05/29/23 1231 05/30/23 0542  INR 1.2 1.1 1.1 1.1 1.1   Recent Labs  Lab 05/25/23 1532 05/26/23 1401 05/29/23 0927  CKTOTAL 237 3,654* 1,183*   CBG: Recent Labs  Lab 05/30/23 1515 05/30/23 1920 05/30/23 2324 05/31/23 0312 05/31/23 0726  GLUCAP 100* 93 82 113* 110*    Iron Studies:  Recent Labs  Lab 05/27/23 1917  FERRITIN 2,399*    Xrays/Other Studies: VAS [place] LOWER EXTREMITY VENOUS (DVT)  Result Date: 05/29/2023  Lower Venous DVT Study Patient Name:  Timothy Hubbard  Date of Exam:   05/29/2023 Medical Rec #: [id number]      Accession #:    [id number] Date of Birth: [birth year]      Patient Gender: M Patient Age:   58 years Exam Location:  [facility] Procedure:      VAS [place] LOWER EXTREMITY VENOUS (DVT) Referring Phys: MURALI RAMASWAMY --------------------------------------------------------------------------------  Indications: Edema, and DIC.  Risk Factors: Patient on ventilator and 3 pressors. Limitations: Line and Significant lower extremity edema, postioning, Foley. Comparison Study: No prior study on file Performing Technologist: Sherren Kerns RVS  Examination Guidelines: A complete evaluation includes B-mode imaging, spectral Doppler, color  Doppler, and power Doppler as needed of all accessible portions of each vessel. Bilateral testing is considered an integral part of a complete examination. Limited examinations for reoccurring indications may be performed as noted. The reflux portion of the exam is performed with the patient in reverse Trendelenburg.  +---------+---------------+---------+-----------+----------+-------------------+ RIGHT    CompressibilityPhasicitySpontaneityPropertiesThrombus Aging      +---------+---------------+---------+-----------+----------+-------------------+ CFV                     Yes      Yes                  patent by color and                                                       Doppler             +---------+---------------+---------+-----------+----------+-------------------+ SFJ                     Yes      Yes                  patent by color and                                                       Doppler             +---------+---------------+---------+-----------+----------+-------------------+ FV Prox                                               Not well visualized +---------+---------------+---------+-----------+----------+-------------------+ FV Mid                                                Not well visualized +---------+---------------+---------+-----------+----------+-------------------+ FV Distal                                             Not well visualized +---------+---------------+---------+-----------+----------+-------------------+ PFV                                                   Not well visualized +---------+---------------+---------+-----------+----------+-------------------+ POP      Full           Yes      Yes                                      +---------+---------------+---------+-----------+----------+-------------------+ PTV  patent by color and                                                        Doppler             +---------+---------------+---------+-----------+----------+-------------------+ PERO     None           No       No                   Acute               +---------+---------------+---------+-----------+----------+-------------------+  +---------+---------------+---------+-----------+----------+-------------------+ LEFT     CompressibilityPhasicitySpontaneityPropertiesThrombus Aging      +---------+---------------+---------+-----------+----------+-------------------+ CFV      Full                                                             +---------+---------------+---------+-----------+----------+-------------------+ SFJ      Full                                                             +---------+---------------+---------+-----------+----------+-------------------+ FV Prox  Full           Yes      Yes                                      +---------+---------------+---------+-----------+----------+-------------------+ FV Mid                                                Not well visualized +---------+---------------+---------+-----------+----------+-------------------+ FV Distal                                             Not well visualized +---------+---------------+---------+-----------+----------+-------------------+ PFV                                                   Not well visualized +---------+---------------+---------+-----------+----------+-------------------+ POP                     Yes      Yes                  patent by color and  Doppler             +---------+---------------+---------+-----------+----------+-------------------+ PTV                                                   patent by color and                                                       Doppler              +---------+---------------+---------+-----------+----------+-------------------+ PERO                                                  Not well visualized +---------+---------------+---------+-----------+----------+-------------------+     Summary: RIGHT: - Findings consistent with acute deep vein thrombosis involving the right peroneal veins.  - No cystic structure found in the popliteal fossa.  LEFT: - There is no evidence of deep vein thrombosis in the lower extremity. However, portions of this examination were limited- see technologist comments above.  *See table(s) above for measurements and observations. Electronically signed by Sherald Hess MD on 05/29/2023 at 12:56:39 PM.    Final      Assessment/Plan:  AKI Stable BUN/creatinine. Likely ATN in setting of PEA arrest x 3. Rhabdomyolysis in setting of seizure and possible prolonged downtime likely contributing. Also considered renal vein thrombosis in setting DIC given urinalysis with blood and WBC. Query autoimmune issue in this young person with coagulopathy. Serologies and complement levels pending. AIN less likely. Continue following creatinine. Maintaining perfusing MAP off pressors. Some response to diuretics overnight, recommend re-dosing and trending UOP. No urgent RRT indication, hopeful for renal recovery with continued diuresis.   Hypervolemia Anasarca, net +30 L since admit due to aggressive fluid and blood resuscitation.   Acidemia Improved after bicarbonate infusion and now stable.  Hypokalemia Replace while diuresing.   Shock, improving Stable of pressors. Still tachycardic.   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   DIC Complicated by venous thromboembolism and hemoperitoneum. Duplex revealed acute DVT involving the right peroneal veins.  Query acute liver failure versus stasis in setting prolonged downtime.  Final recommendations pending attending attestation.  Marrianne Mood  MD 05/31/2023, 8:54 AM

## 2023-06-01 ENCOUNTER — Inpatient Hospital Stay (HOSPITAL_COMMUNITY): Payer: Medicare Other

## 2023-06-01 DIAGNOSIS — I469 Cardiac arrest, cause unspecified: Secondary | ICD-10-CM | POA: Diagnosis not present

## 2023-06-01 LAB — COMPREHENSIVE METABOLIC PANEL
ALT: 154 U/L — ABNORMAL HIGH (ref 0–44)
AST: 126 U/L — ABNORMAL HIGH (ref 15–41)
Albumin: 2.4 g/dL — ABNORMAL LOW (ref 3.5–5.0)
Alkaline Phosphatase: 88 U/L (ref 38–126)
Anion gap: 14 (ref 5–15)
BUN: 31 mg/dL — ABNORMAL HIGH (ref 6–20)
CO2: 31 mmol/L (ref 22–32)
Calcium: 8.2 mg/dL — ABNORMAL LOW (ref 8.9–10.3)
Chloride: 94 mmol/L — ABNORMAL LOW (ref 98–111)
Creatinine, Ser: 4.61 mg/dL — ABNORMAL HIGH (ref 0.61–1.24)
GFR, Estimated: 16 mL/min — ABNORMAL LOW (ref >60)
Glucose, Bld: 81 mg/dL (ref 70–99)
Potassium: 3.5 mmol/L (ref 3.5–5.1)
Sodium: 139 mmol/L (ref 135–145)
Total Bilirubin: 2.3 mg/dL — ABNORMAL HIGH (ref 0.3–1.2)
Total Protein: 5.4 g/dL — ABNORMAL LOW (ref 6.5–8.1)

## 2023-06-01 LAB — CBC
HCT: 28.7 % — ABNORMAL LOW (ref 39.0–52.0)
Hemoglobin: 9.4 g/dL — ABNORMAL LOW (ref 13.0–17.0)
MCH: 29 pg (ref 26.0–34.0)
MCHC: 32.8 g/dL (ref 30.0–36.0)
MCV: 88.6 fL (ref 80.0–100.0)
Platelets: 62 10*3/uL — ABNORMAL LOW (ref 150–400)
RBC: 3.24 MIL/uL — ABNORMAL LOW (ref 4.22–5.81)
RDW: 17.1 % — ABNORMAL HIGH (ref 11.5–15.5)
WBC: 9.3 10*3/uL (ref 4.0–10.5)
nRBC: 0 % (ref 0.0–0.2)

## 2023-06-01 LAB — HEPARIN LEVEL (UNFRACTIONATED)
Heparin Unfractionated: 0.11 IU/mL — ABNORMAL LOW (ref 0.30–0.70)
Heparin Unfractionated: 0.16 IU/mL — ABNORMAL LOW (ref 0.30–0.70)
Heparin Unfractionated: 0.2 IU/mL — ABNORMAL LOW (ref 0.30–0.70)
Heparin Unfractionated: 0.3 IU/mL (ref 0.30–0.70)

## 2023-06-01 LAB — GLUCOSE, CAPILLARY
Glucose-Capillary: 75 mg/dL (ref 70–99)
Glucose-Capillary: 75 mg/dL (ref 70–99)
Glucose-Capillary: 76 mg/dL (ref 70–99)
Glucose-Capillary: 87 mg/dL (ref 70–99)
Glucose-Capillary: 90 mg/dL (ref 70–99)
Glucose-Capillary: 93 mg/dL (ref 70–99)

## 2023-06-01 LAB — PHOSPHORUS: Phosphorus: 3.6 mg/dL (ref 2.5–4.6)

## 2023-06-01 LAB — MAGNESIUM: Magnesium: 2 mg/dL (ref 1.7–2.4)

## 2023-06-01 MED ORDER — DEXMEDETOMIDINE HCL IN NACL 400 MCG/100ML IV SOLN
0.0000 ug/kg/h | INTRAVENOUS | Status: DC
  Administered 2023-06-01: 0.2 ug/kg/h via INTRAVENOUS
  Filled 2023-06-01 (×2): qty 100

## 2023-06-01 MED ORDER — LEVETIRACETAM 500 MG PO TABS
500.0000 mg | ORAL_TABLET | Freq: Once | ORAL | Status: AC
  Administered 2023-06-01: 500 mg via ORAL

## 2023-06-01 MED ORDER — POTASSIUM CHLORIDE 10 MEQ/100ML IV SOLN
10.0000 meq | INTRAVENOUS | Status: AC
  Administered 2023-06-01 (×4): 10 meq via INTRAVENOUS
  Filled 2023-06-01 (×4): qty 100

## 2023-06-01 MED ORDER — FENTANYL CITRATE PF 50 MCG/ML IJ SOSY
25.0000 ug | PREFILLED_SYRINGE | INTRAMUSCULAR | Status: AC | PRN
  Administered 2023-06-01 (×2): 50 ug via INTRAVENOUS
  Filled 2023-06-01 (×2): qty 1

## 2023-06-01 MED ORDER — POLYETHYLENE GLYCOL 3350 17 G PO PACK: 17.0000 g | PACK | Freq: Every day | ORAL | Status: DC | PRN

## 2023-06-01 MED ORDER — ORAL CARE MOUTH RINSE: 15.0000 mL | OROMUCOSAL | Status: DC | PRN

## 2023-06-01 MED ORDER — ORAL CARE MOUTH RINSE
15.0000 mL | OROMUCOSAL | Status: DC
  Administered 2023-06-01 – 2023-06-12 (×19): 15 mL via OROMUCOSAL

## 2023-06-01 MED ORDER — DOCUSATE SODIUM 50 MG/5ML PO LIQD: 100.0000 mg | Freq: Two times a day (BID) | ORAL | Status: DC | PRN

## 2023-06-01 MED ORDER — LEVETIRACETAM 500 MG PO TABS
500.0000 mg | ORAL_TABLET | Freq: Two times a day (BID) | ORAL | Status: DC
  Administered 2023-06-02 – 2023-06-07 (×11): 500 mg via ORAL
  Filled 2023-06-01 (×12): qty 1

## 2023-06-01 MED ORDER — CARMEX CLASSIC LIP BALM EX OINT
TOPICAL_OINTMENT | CUTANEOUS | Status: DC | PRN
  Administered 2023-06-01: 1 via TOPICAL
  Filled 2023-06-01: qty 10

## 2023-06-01 MED ORDER — THIAMINE MONONITRATE 100 MG PO TABS
100.0000 mg | ORAL_TABLET | Freq: Every day | ORAL | Status: DC
  Administered 2023-06-02 – 2023-06-11 (×9): 100 mg via ORAL
  Filled 2023-06-01 (×11): qty 1

## 2023-06-01 NOTE — Progress Notes (Signed)
ANTICOAGULATION CONSULT NOTE  Pharmacy Consult for heparin Indication: DVT  Allergies  Allergen Reactions   Ibuprofen Anaphylaxis    Patient Measurements: Weight: 85.9 kg (189 lb 6 oz) Heparin Dosing Weight: 82 kg  Vital Signs: Temp: 98 F (36.7 C) (09/26 1954) Temp Source: Oral (09/26 1954) BP: 115/86 (09/26 1900) Pulse Rate: 85 (09/26 1900)  Labs: Recent Labs    05/30/23 0542 05/30/23 1220 05/31/23 0400 05/31/23 1653 06/01/23 0200 06/01/23 0452 06/01/23 1144 06/01/23 2059  HGB 9.0*  --  9.3* 9.4*  --  9.4*  --   --   HCT 26.8*  --  28.3* 28.8*  --  28.7*  --   --   PLT 20*  23*  --  46* 55*  --  62*  --   --   APTT 51*  --   --   --   --   --   --   --   LABPROT 14.4  --   --   --   --   --   --   --   INR 1.1  --   --   --   --   --   --   --   HEPARINUNFRC <0.10*  --   --  <0.10*   < > 0.16* 0.20* 0.30  CREATININE 4.31*   < > 4.39* 4.54*  --  4.61*  --   --    < > = values in this interval not displayed.    Estimated Creatinine Clearance: 23.4 mL/min (A) (by C-G formula based on SCr of 4.61 mg/dL (H)).   Assessment: 32 yo male who presented with cardiac arrest 2/2 to seizure. Patient now presenting with new DVT. Notable history of abdominopelvic hemorrhage on CT angio (9/21) with massive transfusion. Previously heparin gtt held 2/2 to thrombocytopenia (Plt 20s). Pharmacy consulted restart heparin in the setting of DVT.  Per heme/onc and CCM, okay to anticoagulate using heparin.   Heparin level (0.3) on lower end of therapeutic range on 1600 units/hr.  No issues with infusion or signs of bleeding per RN.   Goal of Therapy:  Heparin level 0.3-0.5 units/ml Monitor platelets by anticoagulation protocol: Yes   Plan:  Continue heparin infusion at 1600 units/hr  ~8 hour confirmatory heparin level Daily heparin level and CBC while on heparin Monitor for s/sx of bleeding   Thank you for involving pharmacy in the patient's care.   Trixie Rude,  PharmD Clinical Pharmacist 06/01/2023  9:31 PM

## 2023-06-01 NOTE — Progress Notes (Signed)
[facility] Resident Progress Note  HPI: Hospital day 7.  32 y.o. with history of seizures, hypertension, and asthma presented for seizures complicated by PEA cardiac arrest x 2, approximately 3 minutes total downtime. Admitted to ICU for treatment of shock of mixed etiology, cardiogenic and septic due to aspiration pneumonia. Improved to extubation but suffered 3rd brief PEA arrest and re intubated. Now with oliguric AKI. Hospital course complicated by hemoperitoneum and acute anemia in setting of DIC. Also with DVT in RLE. Now off of norepinephrine and vasopressin, extubated.  Furosemide drip started in interval. Patient extubated and stable on nasal cannula this morning. Thinks he's in alcohol rehabilitation facility.  Inpatient medications:  arformoterol  15 mcg Nebulization BID   budesonide (PULMICORT) nebulizer solution  0.5 mg Nebulization BID   Chlorhexidine Gluconate Cloth  6 each Topical Daily   insulin aspart  0-9 Units Subcutaneous Q4H   levETIRAcetam  500 mg Per Tube BID   mouth rinse  15 mL Mouth Rinse Q2H   pantoprazole (PROTONIX) IV  40 mg Intravenous Q12H   sodium chloride flush  10-40 mL Intracatheter Q12H   thiamine  100 mg Per Tube Daily   Physical Exam: Weight change: -2.2 kg  Intake/Output Summary (Last 24 hours) at 06/01/2023 0913 Last data filed at 06/01/2023 0800 Gross per 24 hour  Intake 2289.48 ml  Output 2085 ml  Net 204.48 ml   BP (!) 139/99   Pulse 92   Temp (!) 92.8 F (33.8 C)   Resp 19   Wt 85.9 kg   SpO2 100%   BMI 30.57 kg/m  Vitals:   06/01/23 0500 06/01/23 0700 06/01/23 0800 06/01/23 0820  BP:  (!) 134/103 (!) 137/113 (!) 139/99  Pulse:  85 84 92  Resp:  15 (!) 22 19  Temp:  (!) 97.3 F (36.3 C) 97.7 F (36.5 C) (!) 92.8 F (33.8 C)  TempSrc:   Esophageal   SpO2:  (!) 75% 100% 100%  Weight: 85.9 kg        No distress Heart rate is normal, rhythm is regular, radial pulse is strong Breathing comfortably on nasal  cannula, lungs clear Abdomen is soft and non-tender Skin is warm and dry, anasarca, penile edema, note some blistering on right wrist Foley in place, CVC RIJ Alert, oriented to person but not time, place, or event, follows simple instructions  Basic Metabolic Panel: Recent Labs  Lab 05/26/23 1401 05/27/23 0408 05/27/23 1503 05/28/23 0302 05/28/23 0336 05/29/23 0226 05/29/23 0309 05/29/23 1627 05/30/23 0542 05/30/23 1220 05/30/23 1733 05/31/23 0400 05/31/23 1653 06/01/23 0452  NA  --  131*   < > 133*   < > 135 133*  --  136 136  --  137 138 139  K  --  3.6   < > 4.4   < > 3.5 3.6  --  3.6 3.5  --  3.3* 3.8 3.5  CL  --  84*   < > 83*  --  88*  --   --  86* 90*  --  91* 91* 94*  CO2  --  34*   < > 28  --  26  --   --  30 29  --  31 32 31  GLUCOSE  --  166*   < > 108*  --  130*  --   --  115* 111*  --  119* 104* 81  BUN  --  16   < > 18  --  19  --   --  23* 25*  --  29* 29* 31*  CREATININE  --  3.03*   < > 3.30*  --  3.80*  --   --  4.31* 4.38*  --  4.39* 4.54* 4.61*  ALBUMIN 1.9* 1.9*  --  3.5  --  2.9*  --   --  2.6*  --   --  2.5*  --  2.4*  CALCIUM  --  5.9*   < > 7.3*  --  8.0*  --   --  7.9* 8.0*  --  8.0* 7.8* 8.2*  PHOS  --   --   --   --   --  6.2*  --  5.2* 4.5  --  4.4 4.1 3.5 3.6   < > = values in this interval not displayed.   Liver Function Tests: Recent Labs  Lab 05/30/23 0542 05/31/23 0400 06/01/23 0452  AST 211* 151* 126*  ALT 203* 185* 154*  ALKPHOS 74 84 88  BILITOT 2.6* 2.1* 2.3*  PROT 5.5* 5.7* 5.4*  ALBUMIN 2.6* 2.5* 2.4*   No results for input(s): "LIPASE", "AMYLASE" in the last 168 hours. Recent Labs  Lab 05/27/23 1514  AMMONIA 34   CBC: Recent Labs  Lab 05/25/23 1404 05/25/23 1414 05/26/23 1401 05/26/23 1404 05/27/23 1728 05/27/23 1917 05/28/23 1053 05/28/23 1648 05/30/23 0542 05/31/23 0400 05/31/23 1653 06/01/23 0452  WBC 8.0   < > 5.5   < > 5.0   < > 8.6   < > 6.5 6.6 8.0 9.3  NEUTROABS 4.8  --  3.1  --  3.2  --  6.8  --    --   --   --   --   HGB 11.3*   < > 6.4*   < > 10.0*   < > 9.8*   < > 9.0* 9.3* 9.4* 9.4*  HCT 37.3*   < > 18.8*   < > 29.0*   < > 27.9*   < > 26.8* 28.3* 28.8* 28.7*  MCV 109.4*   < > 90.4   < > 81.2   < > 81.1   < > 85.6 86.3 86.7 88.6  PLT 122*   < > 32*   < > 44*  43*   < > 46*   < > 20*  23* 46* 55* 62*   < > = values in this interval not displayed.   PT/INR: Recent Labs  Lab 05/27/23 1917 05/28/23 0302 05/29/23 0226 05/29/23 1231 05/30/23 0542  INR 1.2 1.1 1.1 1.1 1.1   Recent Labs  Lab 05/25/23 1532 05/26/23 1401 05/29/23 0927  CKTOTAL 237 3,654* 1,183*   CBG: Recent Labs  Lab 05/31/23 1533 05/31/23 1935 05/31/23 2338 06/01/23 0320 06/01/23 0744  GLUCAP 115* 100* 95 93 90    Iron Studies:  Recent Labs  Lab 05/27/23 1917  FERRITIN 2,399*    Xrays/Other Studies: DG CHEST PORT 1 VIEW  Result Date: 06/01/2023 CLINICAL DATA:  Pleural effusion EXAM: PORTABLE CHEST 1 VIEW COMPARISON:  Radiograph 05/28/2023 FINDINGS: The endotracheal tube tip is 17 mm from the carina. Enteric tube and right internal jugular central line in place. Bilateral pleural effusions and lung opacities which may have improved mildly from prior exam, particularly on the right. Stable heart size and mediastinal contours. No convincing pulmonary edema. No pneumothorax. IMPRESSION: Bilateral pleural effusions and lung opacities which may have mildly improved from prior exam, particularly on the right. Electronically Signed  By: Narda Rutherford M.D.   On: 06/01/2023 09:04   DG Abd 1 View  Result Date: 06/01/2023 CLINICAL DATA:  Orogastric tube placement. EXAM: ABDOMEN - 1 VIEW COMPARISON:  Radiograph yesterday FINDINGS: The enteric tube tip and side port are below the diaphragm, directed distally. Air-filled loops of bowel centrally have slightly increased from yesterday. IMPRESSION: Tip and side port of the enteric tube below the diaphragm, directed distally. Electronically Signed   By: Narda Rutherford M.D.   On: 06/01/2023 09:02   DG Abd Portable 1V  Result Date: 05/31/2023 CLINICAL DATA:  Orogastric tube placement. EXAM: PORTABLE ABDOMEN - 1 VIEW COMPARISON:  03/15/2022.  Abdomen and pelvis CTA dated 05/27/2023. FINDINGS: Orogastric tube extending into the mid stomach and curved back upon itself with its tip in the proximal stomach inferior to the gastroesophageal junction and side hole in the gastric cardia. Paucity of intestinal gas with no dilated bowel loops visualized. Unremarkable bones. IMPRESSION: Orogastric tube extending into the mid stomach and curved back upon itself with its tip in the proximal stomach inferior to the gastroesophageal junction and side hole in the gastric cardia. Electronically Signed   By: Beckie Salts M.D.   On: 05/31/2023 19:34     Assessment/Plan:  AKI Stable BUN/creatinine. ATN in setting of PEA arrest x 3. Rhabdomyolysis in setting of seizure and possible prolonged downtime likely contributing. Also considered renal vein thrombosis in setting DIC given urinalysis with blood and WBC. ANA, anti-phospholipid antibodies negative. Complement marginally low. Think autoimmune issue is ruled out at this point. AIN less likely. Improved UOP with furosemide drip but remains net positive yesterday due to IV piggyback volume and heparin. Encouraged by UOP, electrolytes and creatinine stable. Continue furosemide infusion and monitor for renal recovery, okay to run net even to slightly negative.   Hypervolemia Anasarca, net +30 L since admit due to aggressive fluid and blood resuscitation.   Acidemia Improved after bicarbonate infusion and now stable.   Hypokalemia, improved Replace while diuresing.   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   Coagulopathy Complicated by venous thromboembolism and hemoperitoneum. Duplex revealed acute thrombosis involving the right peroneal veins.  Query acute liver failure versus stasis in setting  prolonged downtime. Serologies ruled out antiphospholipid syndrome, less likely DIC/TTP without schistocytes.   Final recommendations pending attending attestation.  Marrianne Mood MD 06/01/2023, 9:13 AM

## 2023-06-01 NOTE — Procedures (Signed)
Extubation Procedure Note  Patient Details:   Name: Timothy Hubbard DOB: 08/03/1991 MRN: [id number]   Airway Documentation:    Vent end date: 06/01/23 Vent end time: 0820   Evaluation  O2 sats: stable throughout Complications: No apparent complications Patient did tolerate procedure well. Bilateral Breath Sounds: Diminished    Patient extubated per written order with RN at bedside. Cuff leak was present prior to extubation, no stridor post extubation. Patient was placed on 3L [place] and tolerating well at this time.         Yes  Sunita Demond Remonia Richter 06/01/2023, 8:26 AM

## 2023-06-01 NOTE — Progress Notes (Signed)
NAME:  Timothy Hubbard, MRN:  [id number], DOB:  [birth year], LOS: 7 ADMISSION DATE:  05/25/2023, CONSULTATION DATE:  05/25/2023 REFERRING MD:  EDP, CHIEF COMPLAINT:  Seizures and cardiac arrest    History of Present Illness:  Patient is a [name] w/ pertinent PMH seizures, asthma, htn, mood disorder presents to [facility] ED on 9/19 w/ seizure and cardiac arrest.   Per family patient compliant w/ seizure medications.  Had a witnessed seizure at home. EMS arrived and patient unresponsive. On route to ED patient became bradycardic 20-30 and pea arrested. 1 round cpr performed. Patient breathing spontaneously post arrest. Started on epi drip. On ED arrival patient altered but withdrew to painful stimuli. During intubation patient pea arrested and rosc after 1 round of cpr. EKG sinus tachy 130s. Patient remained hypotensive post intubation and cvl placed. Currently on 3 pressors. Temp 92 F and placed on bair hugger. LA 14. Given IV fluids and cultures obtained and placed on broad spectrum antibiotics. CXR w/ ett in good position; no infiltrate appreciated. Abg 6.88, 56, 379, 11. Given 2 amps of bicarb and ordered bicarb drip. CTA head, abd, pelvis ordered/pending. PCCM consulted for icu admission.  Pertinent  Medical History  past medical history of Acute encephalopathy (03/10/2022), Adenomatous polyp of descending colon, Asthma, Encephalopathy acute (03/01/2022), Hematochezia, HTN (hypertension), Hypokalemia (03/04/2022), Need for hepatitis C screening test (04/27/2022), and Seizures (HCC)  Significant Hospital Events: Including procedures, antibiotic start and stop dates in addition to other pertinent events   9/19 admit to [facility]; seizure and cardiac arrest 9/20 extubated 05/27/2023: PEA arrest and after woke up and was confused.  Total of 2 rounds of CPR with epi and bicarb x 1.  But became bradycardic > intubated.  Had A-line placed.  Also central line ascites tap did show blood.  Was given 4 units PRBC, 4 units  FFP and 2 units platelets and 1 unit of cryo major transfusion protocol Cc consult for hemoperitoneum.  DIC panel positive.  He had some oozing from mouth and IV sites EEG showed symmetric low voltage no/seizure activity  Interim History / Subjective:  Overnight events: No acute events overnight  Patient evaluated at bedside this morning.  Patient is able to follow commands this morning.  Extubated.  He does report that he is having vision loss in his right eye.  He denies any pain in his eye.  Objective   Blood pressure (!) 130/98, pulse (!) 189, temperature 98 F (36.7 C), temperature source Oral, resp. rate (!) 23, weight 85.9 kg, SpO2 100%.    Vitals: Blood pressure with maps in the 90s to 100s with interestingly enough narrow pulse pressure.  Satting at 100% on ventilator settings.  Output of 2 L yesterday.  Net positive however. Vent Mode: PRVC FiO2 (%):  [40 %] 40 % Set Rate:  [14 bmp] 14 bmp Vt Set:  [470 mL] 470 mL PEEP:  [5 cmH20] 5 cmH20 Plateau Pressure:  [19 cmH20-27 cmH20] 27 cmH20   Intake/Output Summary (Last 24 hours) at 06/01/2023 1159 Last data filed at 06/01/2023 1050 Gross per 24 hour  Intake 2018.2 ml  Output 2365 ml  Net -346.8 ml   Filed Weights   05/30/23 0408 05/31/23 0422 06/01/23 0500  Weight: 87.3 kg 88.1 kg 85.9 kg    Examination: General: Extubated, in some distress, able to follow all instructions HENT: Normocephalic, atraumatic Eyes: Right eye with some comfortable hemorrhage noted, EOMI, pupils equal and reactive Lungs: Decreased breath sounds bilaterally  Cardiovascular: Regular rate and rhythm, no murmurs, rubs, or gallops Abdomen: Abdomen soft, nontender Extremities: Bilateral lower extremity swelling appreciated, 2+ pitting edema bilaterally Neuro: Intubated and sedated GU: Increasing urine output  Labs reviewed phosphorus 3.6 Magnesium 2.0 Potassium 3.5, sodium 139, CO2 31 Creatinine 4.61 up from 4.39 yesterday, GFR 16, decreasing  liver enzymes Hemoglobin 9.4, platelet count increasing to 62 from 46 Glucose 93-115  Resolved Hospital Problem list   #Lactic acidosis  Assessment & Plan:  This is a 32 year old male with a past medical history of seizures, asthma, hypertension who presents to the ED for concerns of seizure and had out-of-hospital cardiac arrest.   #Out-of-hospital cardiac arrest #Acute hypoxemic respiratory failure now extubated Patient is more awake this morning.  He is able to follow all instructions.  He is able to show that he can protect his own airway.  Patient was extubated.  He has been weaned off all pressors. -Continue with supportive care -Continue to monitor vital signs closely -Can get patient up with PT/OT  #Sepsis, resolving Patient is afebrile.  Today is day 4 of Unasyn.  Last day of antibiotics.  Patient did not have a white count anymore.  Will finish antibiotic treatment today. -Continue Unasyn day 4, day 7 of total antibiotics  #Acute blurry vision Right eye with some blurry vision noted.  He states he is not able to see much out of his right eye.  He is able to track me appropriately.  On exam, patient does have some conjunctival hemorrhage.  This could be related to his conjunctival hemorrhage.  Will continue to keep an eye on this.  Patient is on heparin, so I do not think patient had a stroke.  He has no other focal neurological deficits. -Continue serial neurological exams  #Acute right peroneal vein DVT Stable at this time.  Patient is on heparin.  No acute concerns for further bleeding.  Platelets are slowly trending up.   -Transfuse platelet if platelet drops below 10, but if patient does start bleeding, transfuse if platelets less than 20 -Continue heparin, can discontinue with CBC shows platelet below 30 -Trend CBC  #Seizure disorder No further seizures.  Patient is currently on Keppra.  No further seizures -Neurology following -Continue Keppra 500 mg twice  daily  #Shock liver #Anemia of critical illness #Hemoperitoneum #Thrombocytopenia Stable at this time.  Liver enzymes are improving.  Platelets are improving.  Hemoglobin is stable.  Will continue to monitor -Hemoglobin 9.4 today -Hematology following, appreciate recommendations -Continue heparin infusion  #HFrEF Patient continues to be volume overloaded.  Most recent echo showing left ventricular ejection fraction of 20 to 25%.  Patient still on Lasix drip.  Will need to hold GDMT given acute renal dysfunction.    -Continue Lasix infusion -Need to hold GDMT in setting of acute kidney injury -Strict I's and O's  #Right pleural effusion Patient has been extubated.  Currently on room air.  Oxygen saturations are well.  Patient had 2 L of output yesterday.  Will continue with Lasix infusion.  Repeat chest x-ray today did show some improvement. -Continue Lasix infusion -Continue to monitor for worsening respiratory status  #Acute renal failure Creatinine is slowly increasing up to 4.61 today.  Patient is producing urine.  Electrolytes within normal limits.  BUN is slightly uptrending, but otherwise stable.  Will continue Lasix infusion today.  Can avoid CRRT today.  -Nephrology following, appreciate recommendations -Acute renal failure likely in setting of rhabdo plus ATN -Continue Lasix infusion -Monitor  strict ins and outs  Best Practice (right click and "Reselect all SmartList Selections" daily)   Diet/type: tubefeeds DVT prophylaxis: Systemic heparin GI prophylaxis: PPI Lines: N/A and Central line Foley:  Yes, and it is still needed Code Status:  full code  Labs   CBC: Recent Labs  Lab 05/25/23 1404 05/25/23 1414 05/26/23 1401 05/26/23 1404 05/27/23 1728 05/27/23 1917 05/28/23 1053 05/28/23 1648 05/29/23 2346 05/30/23 0542 05/31/23 0400 05/31/23 1653 06/01/23 0452  WBC 8.0   < > 5.5   < > 5.0   < > 8.6   < > 7.0 6.5 6.6 8.0 9.3  NEUTROABS 4.8  --  3.1  --  3.2   --  6.8  --   --   --   --   --   --   HGB 11.3*   < > 6.4*   < > 10.0*   < > 9.8*   < > 9.4* 9.0* 9.3* 9.4* 9.4*  HCT 37.3*   < > 18.8*   < > 29.0*   < > 27.9*   < > 27.5* 26.8* 28.3* 28.8* 28.7*  MCV 109.4*   < > 90.4   < > 81.2   < > 81.1   < > 85.4 85.6 86.3 86.7 88.6  PLT 122*   < > 32*   < > 44*  43*   < > 46*   < > 24* 20*  23* 46* 55* 62*   < > = values in this interval not displayed.    Basic Metabolic Panel: Recent Labs  Lab 05/30/23 0542 05/30/23 1220 05/30/23 1733 05/31/23 0400 05/31/23 1653 06/01/23 0452  NA 136 136  --  137 138 139  K 3.6 3.5  --  3.3* 3.8 3.5  CL 86* 90*  --  91* 91* 94*  CO2 30 29  --  31 32 31  GLUCOSE 115* 111*  --  119* 104* 81  BUN 23* 25*  --  29* 29* 31*  CREATININE 4.31* 4.38*  --  4.39* 4.54* 4.61*  CALCIUM 7.9* 8.0*  --  8.0* 7.8* 8.2*  MG 2.2  --  2.2 2.0 2.0 2.0  PHOS 4.5  --  4.4 4.1 3.5 3.6   GFR: Estimated Creatinine Clearance: 23.4 mL/min (A) (by C-G formula based on SCr of 4.61 mg/dL (H)). Recent Labs  Lab 05/25/23 1532 05/26/23 0128 05/26/23 1401 05/26/23 2316 05/27/23 0034 05/27/23 0408 05/27/23 1514 05/27/23 1728 05/27/23 2122 05/28/23 0302 05/30/23 0542 05/31/23 0400 05/31/23 1653 06/01/23 0452  PROCALCITON 0.60  --   --   --   --   --   --   --   --   --   --   --   --   --   WBC  --    < > 5.5   < >  --    < >  --    < >  --    < > 6.5 6.6 8.0 9.3  LATICACIDVEN  --    < > >9.0*  --  7.3*  --  >9.0*  --  3.0*  --   --   --   --   --    < > = values in this interval not displayed.    Liver Function Tests: Recent Labs  Lab 05/28/23 0302 05/29/23 0226 05/29/23 0927 05/30/23 0542 05/31/23 0400 06/01/23 0452  AST 603* 600*  --  211* 151* 126*  ALT 244*  254*  --  203* 185* 154*  ALKPHOS 48 74  --  74 84 88  BILITOT 3.2* 3.9* 3.6* 2.6* 2.1* 2.3*  PROT 5.9* 5.7*  --  5.5* 5.7* 5.4*  ALBUMIN 3.5 2.9*  --  2.6* 2.5* 2.4*   No results for input(s): "LIPASE", "AMYLASE" in the last 168 hours. Recent Labs   Lab 05/27/23 1514  AMMONIA 34    ABG    Component Value Date/Time   PHART 7.574 (H) 05/29/2023 0309   PCO2ART 33.7 05/29/2023 0309   PO2ART 96 05/29/2023 0309   HCO3 31.1 (H) 05/29/2023 0309   TCO2 32 05/29/2023 0309   ACIDBASEDEF 15.0 (H) 05/26/2023 0454   O2SAT 98 05/29/2023 0309     Coagulation Profile: Recent Labs  Lab 05/27/23 1917 05/28/23 0302 05/29/23 0226 05/29/23 1231 05/30/23 0542  INR 1.2 1.1 1.1 1.1 1.1    Cardiac Enzymes: Recent Labs  Lab 05/25/23 1532 05/26/23 1401 05/29/23 0927  CKTOTAL 237 3,654* 1,183*    HbA1C: Hgb A1c MFr Bld  Date/Time Value Ref Range Status  05/26/2023 02:01 PM 5.7 (H) 4.8 - 5.6 % Final    Comment:    (NOTE) Pre diabetes:          5.7%-6.4%  Diabetes:              >6.4%  Glycemic control for   <7.0% adults with diabetes   03/03/2022 08:42 AM <4.2 (L) 4.8 - 5.6 % Final    Comment:    (NOTE) **Verified by repeat analysis**         Prediabetes: 5.7 - 6.4         Diabetes: >6.4         Glycemic control for adults with diabetes: <7.0     CBG: Recent Labs  Lab 05/31/23 1935 05/31/23 2338 06/01/23 0320 06/01/23 0744 06/01/23 1126  GLUCAP 100* 95 93 90 76    Review of Systems:   Negative Except what is stated in HPI   Past Medical History:  He,  has a past medical history of Acute encephalopathy (03/10/2022), Adenomatous polyp of descending colon, Asthma, Encephalopathy acute (03/01/2022), Hematochezia, HTN (hypertension), Hypokalemia (03/04/2022), Need for hepatitis C screening test (04/27/2022), and Seizures (HCC).   Surgical History:   Past Surgical History:  Procedure Laterality Date   COLONOSCOPY WITH PROPOFOL N/A 03/14/2022   Procedure: COLONOSCOPY WITH PROPOFOL;  Surgeon: Shellia Cleverly, DO;  Location: [facility];  Service: Gastroenterology;  Laterality: N/A;   POLYPECTOMY  03/14/2022   Procedure: POLYPECTOMY;  Surgeon: Shellia Cleverly, DO;  Location: [facility];  Service:  Gastroenterology;;     Social History:   reports that he has been smoking cigarettes. He started smoking about 15 years ago. He has a 7.9 pack-year smoking history. He has never used smokeless tobacco. He reports current alcohol use of about 42.0 standard drinks of alcohol per week. He reports current drug use. Frequency: 7.00 times per week. Drug: Marijuana.   Family History:  His family history includes Cancer in his maternal grandfather; Hypertension in his mother.   Allergies Allergies  Allergen Reactions   Ibuprofen Anaphylaxis     Home Medications  Prior to Admission medications   Medication Sig Start Date End Date Taking? Authorizing Provider  acetaminophen (TYLENOL) 325 MG tablet Take 2 tablets (650 mg total) by mouth every 6 (six) hours as needed for mild pain or moderate pain. 03/30/22  Yes Littie Deeds, MD  albuterol (PROVENTIL) (2.5 MG/3ML) 0.083% nebulizer  solution Take 3 mLs (2.5 mg total) by nebulization every 6 (six) hours as needed for wheezing or shortness of breath. 04/04/23  Yes Vonna Drafts, MD  amLODipine (NORVASC) 10 MG tablet Take 1 tablet (10 mg total) by mouth at bedtime. 05/22/23  Yes Hindel, Leah, MD  divalproex (DEPAKOTE) 500 MG DR tablet Take 1 tablet (500 mg total) by mouth 2 (two) times daily. 04/04/23  Yes Vonna Drafts, MD  ferrous sulfate 324 (65 Fe) MG TBEC Take 1 tablet (325 mg total) by mouth daily at 12 noon. 04/04/23  Yes Vonna Drafts, MD  fluconazole (DIFLUCAN) 200 MG tablet Take 0.5 tablets (100 mg total) by mouth daily. 04/24/23  Yes Alwyn Ren, MD  folic acid (FOLVITE) 1 MG tablet TAKE 1 TABLET (1 MG TOTAL) BY MOUTH DAILY (AM) Patient taking differently: Take 1 mg by mouth daily. 04/04/23  Yes Vonna Drafts, MD  lisinopril (ZESTRIL) 5 MG tablet Take 1 tablet (5 mg total) by mouth at bedtime. 05/22/23  Yes Hindel, Leah, MD  Multiple Vitamin (MULTIVITAMIN WITH MINERALS) TABS tablet Take 1 tablet by mouth daily. 04/04/23  Yes Vonna Drafts, MD   naltrexone (DEPADE) 50 MG tablet TAKE 1/2 TABLET (25 MG TOTAL) BY MOUTH DAILY (AM) Patient taking differently: Take 25 mg by mouth daily. 04/04/23  Yes Vonna Drafts, MD  OLANZapine (ZYPREXA) 5 MG tablet Take 1 tablet (5 mg total) by mouth at bedtime. Future refills will need to come from psychiatry. 04/04/23  Yes Vonna Drafts, MD  pantoprazole (PROTONIX) 40 MG tablet TAKE 1 TABLET (40 MG TOTAL) BY MOUTH DAILY (AM) Patient taking differently: Take 40 mg by mouth daily. 04/04/23  Yes Mahmood, Unknown Foley, MD  polyethylene glycol (MIRALAX / GLYCOLAX) 17 g packet MIX AND DRINK BY MOUTH DAILY AS NEEDED FOR MODERATE CONSTIPATION 04/04/23  Yes Vonna Drafts, MD  VENTOLIN HFA 108 (90 Base) MCG/ACT inhaler Inhale 1-2 puffs into the lungs every 6 (six) hours as needed for wheezing or shortness of breath. 04/04/23  Yes Vonna Drafts, MD  feeding supplement (ENSURE ENLIVE / ENSURE PLUS) LIQD Take 237 mLs by mouth 2 (two) times daily between meals. Patient not taking: Reported on 05/25/2023 04/24/23   Alwyn Ren, MD  fluticasone [facility]) 50 MCG/ACT nasal spray Place 2 sprays into both nostrils daily. Patient not taking: Reported on 05/25/2023 04/04/23   Vonna Drafts, MD  hydrocortisone (ANUSOL-HC) 2.5 % rectal cream Place 1 Application rectally 2 (two) times daily. Patient not taking: Reported on 05/25/2023 04/04/23   Vonna Drafts, MD  mometasone-formoterol [facility]) 100-5 MCG/ACT AERO INHALE TWO PUFFS INTO THE LUNGS TWICE A DAY Patient not taking: Reported on 05/25/2023 04/04/23   Vonna Drafts, MD  Pedialyte (PEDIALYTE) SOLN Take 240 mLs by mouth every 6 (six) hours. Patient not taking: Reported on 05/25/2023 04/23/23   Alwyn Ren, MD     Critical care time: 40 mins     Modena Slater, DO Internal Medicine Resident PGY-2 Pager: [phone]

## 2023-06-01 NOTE — Progress Notes (Addendum)
ANTICOAGULATION CONSULT NOTE  Pharmacy Consult for heparin Indication: DVT  Allergies  Allergen Reactions   Ibuprofen Anaphylaxis    Patient Measurements: Weight: 85.9 kg (189 lb 6 oz) Heparin Dosing Weight: 82 kg  Vital Signs: Temp: 98 F (36.7 C) (09/26 1127) Temp Source: Oral (09/26 1127) BP: 139/104 (09/26 1200) Pulse Rate: 177 (09/26 1200)  Labs: Recent Labs    05/30/23 0542 05/30/23 1220 05/31/23 0400 05/31/23 1653 06/01/23 0200 06/01/23 0452 06/01/23 1144  HGB 9.0*  --  9.3* 9.4*  --  9.4*  --   HCT 26.8*  --  28.3* 28.8*  --  28.7*  --   PLT 20*  23*  --  46* 55*  --  62*  --   APTT 51*  --   --   --   --   --   --   LABPROT 14.4  --   --   --   --   --   --   INR 1.1  --   --   --   --   --   --   HEPARINUNFRC <0.10*  --   --  <0.10* 0.11* 0.16* 0.20*  CREATININE 4.31*   < > 4.39* 4.54*  --  4.61*  --    < > = values in this interval not displayed.    Estimated Creatinine Clearance: 23.4 mL/min (A) (by C-G formula based on SCr of 4.61 mg/dL (H)).   Assessment: 32 yo male who presented with cardiac arrest 2/2 to seizure. Patient now presenting with new DVT. Notable history of abdominopelvic hemorrhage on CT angio (9/21) with massive transfusion. Previously heparin gtt held 2/2 to thrombocytopenia (Plt 20s). Pharmacy consulted restart heparin in the setting of DVT.  Per heme/onc and CCM, okay to anticoagulate using heparin.   Heparin level subtherapeutic at 0.2 on 1500 units/hr. Hgb low but stable, plt trending up (62). No issues with infusion or signs of bleeding per RN.   Goal of Therapy:  Heparin level 0.3-0.5 units/ml Monitor platelets by anticoagulation protocol: Yes   Plan:  No bolus in setting of increased bleed risk with thrombocytopenia Increase heparin gtt to 1600 units/hr  8 hour heparin level Daily heparin level and CBC while on heparin Monitor for s/sx of bleeding   Thank you for involving pharmacy in the patient's care.   Theotis Burrow, PharmD PGY1 Acute Care Pharmacy Resident  06/01/2023 1:16 PM

## 2023-06-01 NOTE — Progress Notes (Signed)
Nutrition Follow-up  DOCUMENTATION CODES:  Not applicable  INTERVENTION:  Monitor SLP evaluation for diet advancement Add Boost Breeze po TID, each supplement provides 250 kcal and 9 grams of protein If cleared for solid foods, will add Magic cup TID with meals, each supplement provides 290 kcal and 9 grams of protein Pt does not like milk based supplements  NUTRITION DIAGNOSIS:  Inadequate oral intake related to inability to eat as evidenced by NPO status. - remains applicable  GOAL:  Patient will meet greater than or equal to 90% of their needs - progressing, diet advanced, SLP anticipates no problem with solids at mental status improves  MONITOR:  PO intake, Diet advancement, Labs, I & O's  REASON FOR ASSESSMENT:  Consult Enteral/tube feeding initiation and management  ASSESSMENT:  Pt with hx of seizure disorder, tobacco use, EtOH abuse, and HTN presented to ED after having a seizure at home. Arrested in ED and required intubation and initiation of pressor support.  9/19 - admitted, arrest x 2 in ED, intubated 9/20 - extubated 9/21 - PEA arrest, intubated 9/26 - extubated, SLP eval, cleared for thin liquids  Pt sitting up in bed at the time of assessment. Extubated yesterday and able to have liquids at this time per SLP. Pt able to talk and answer questions this AM but still seems a little disoriented at times. Unable to tell me his UBW.   States he does not like milk.   SLP to re-evaluate today and determine if pt is appropriate for solid foods. Discussed in rounds, overall looking improved today.   Intake/Output Summary (Last 24 hours) at 06/02/2023 0917 Last data filed at 06/02/2023 0600 Gross per 24 hour  Intake 904.59 ml  Output 1455 ml  Net -550.41 ml  Net IO Since Admission: 29,619.48 mL [06/02/23 0917]  Admit weight: 58.6 Current weight: 85.1 kg Pt with significant edema - unsure of usual dry weight  Nutritionally Relevant Medications: Scheduled Meds:   insulin aspart  0-9 Units Subcutaneous Q4H   pantoprazole (PROTONIX) IV  40 mg Intravenous Q12H   thiamine  100 mg Oral Daily   Continuous Infusions:  furosemide (LASIX) 200 mg in dextrose 5 % 100 mL (2 mg/mL) infusion 10 mg/hr (06/02/23 0600)   PRN Meds: docusate, ondansetron, polyethylene glycol  Labs Reviewed: Chloride 92 Creatinine 4.29, BUN 37 CBG ranges from 62-90 mg/dL over the last 24 hours HgbA1c 5.7%  NUTRITION - FOCUSED PHYSICAL EXAM: Significant edema present. Unable to perform complete exam at this time as muscle and fat stores can not be palpated. No signs of micronutrient deficiencies.  Flowsheet Row Most Recent Value  Orbital Region No depletion  Upper Arm Region Unable to assess  Thoracic and Lumbar Region Unable to assess  Buccal Region No depletion  Temple Region No depletion  Clavicle Bone Region No depletion  Clavicle and Acromion Bone Region Mild depletion  Scapular Bone Region Unable to assess  Dorsal Hand Unable to assess  Patellar Region Unable to assess  Anterior Thigh Region Unable to assess  Posterior Calf Region Unable to assess  Edema (RD Assessment) Severe  [generalized, BLE and BUE]  Hair Reviewed  Eyes Reviewed  Mouth Reviewed  Skin Reviewed   Diet Order:   Diet Order             Diet clear liquid Room service appropriate? Yes; Fluid consistency: Thin  Diet effective now  EDUCATION NEEDS:  Not appropriate for education at this time  Skin:  Skin Assessment: Reviewed RN Assessment  Last BM:  9/27 - type 7  Height:  Ht Readings from Last 1 Encounters:  05/22/23 5\' 6"  (1.676 m)    Weight:  Wt Readings from Last 1 Encounters:  06/02/23 85.1 kg    Ideal Body Weight:  64.5 kg  BMI:  Body mass index is 30.28 kg/m.  Estimated Nutritional Needs:  Kcal:  1900-2200 kcal/d Protein:  95-110 g/d Fluid:  2L/d    Greig Castilla, RD, LDN Clinical Dietitian RD pager # available in [facility]  After hours/weekend  pager # available in [facility]

## 2023-06-01 NOTE — Progress Notes (Signed)
ANTICOAGULATION CONSULT NOTE  Pharmacy Consult for heparin Indication: DVT  Allergies  Allergen Reactions   Ibuprofen Anaphylaxis    Patient Measurements: Weight: 88.1 kg (194 lb 3.6 oz) Heparin Dosing Weight: 82 kg  Vital Signs: Temp: 99.3 F (37.4 C) (09/25 2200) Temp Source: Esophageal (09/25 2200) BP: 131/103 (09/25 2200) Pulse Rate: 103 (09/25 2200)  Labs: Recent Labs    05/29/23 0927 05/29/23 1231 05/29/23 2339 05/30/23 0542 05/30/23 1220 05/31/23 0400 05/31/23 1653 06/01/23 0200  HGB  --   --    < > 9.0*  --  9.3* 9.4*  --   HCT  --   --    < > 26.8*  --  28.3* 28.8*  --   PLT  --   --    < > 20*  23*  --  46* 55*  --   APTT  --  30  --  51*  --   --   --   --   LABPROT  --  14.6  --  14.4  --   --   --   --   INR  --  1.1  --  1.1  --   --   --   --   HEPARINUNFRC  --   --    < > <0.10*  --   --  <0.10* 0.11*  CREATININE  --   --    < > 4.31* 4.38* 4.39* 4.54*  --   CKTOTAL 1,183*  --   --   --   --   --   --   --    < > = values in this interval not displayed.    Estimated Creatinine Clearance: 24.1 mL/min (A) (by C-G formula based on SCr of 4.54 mg/dL (H)).   Assessment: 32 yo male who presented with cardiac arrest 2/2 to seizure. Patient now presenting with new DVT. Notable history of abdominopelvic hemorrhage on CT angio (9/21) with massive transfusion. Previously heparin gtt held 2/2 to thrombocytopenia (Plt 20s). Plt trending up, now in the 40s. Pharmacy consulted restart heparin in the setting of DVT.  Per heme/onc and CCM, okay to anticoagulate using heparin.   Heparin level 0.11 on infusion at 1350 units/hr. No issues with gtt running. Hgb 9.4, plt 55 (trending up). No signs of bleeding per RN.   Goal of Therapy:  Heparin level 0.3-0.5 units/ml Monitor platelets by anticoagulation protocol: Yes   Plan:  No bolus in setting of increased bleed risk with thrombocytopenia Increase heparin gtt to 1500 units/hr  F/u 8 hour heparin level    Thank you for involving pharmacy in the patient's care.   Arabella Merles, PharmD. Clinical Pharmacist 06/01/2023 3:56 AM

## 2023-06-01 NOTE — Evaluation (Signed)
Clinical/Bedside Swallow Evaluation Patient Details  Name: Timothy Hubbard MRN: [id number] Date of Birth: Oct 12, 1990  Today's Date: 06/01/2023 Time: SLP Start Time (ACUTE ONLY): 1535 SLP Stop Time (ACUTE ONLY): 1550 SLP Time Calculation (min) (ACUTE ONLY): 15 min  Past Medical History:  Past Medical History:  Diagnosis Date   Acute encephalopathy 03/10/2022   Adenomatous polyp of descending colon    Asthma    Encephalopathy acute 03/01/2022   Hematochezia    HTN (hypertension)    Hypokalemia 03/04/2022   Need for hepatitis C screening test 04/27/2022   Seizures (HCC)    Past Surgical History:  Past Surgical History:  Procedure Laterality Date   COLONOSCOPY WITH PROPOFOL N/A 03/14/2022   Procedure: COLONOSCOPY WITH PROPOFOL;  Surgeon: Shellia Cleverly, DO;  Location: [facility];  Service: Gastroenterology;  Laterality: N/A;   POLYPECTOMY  03/14/2022   Procedure: POLYPECTOMY;  Surgeon: Shellia Cleverly, DO;  Location: [facility];  Service: Gastroenterology;;   HPI:  Patient is a 32 y.o. male with PMH: seizures, HTN, asthma. He presented to the hospital on 05/25/23 after having a witnessed seizure. EMS was called and when they arrived, patient was unresponsive. On route to ED, patient became bradycardic and went into PEA cardiac arrest, ROSC was achieved after two minutes but in ED, patient went into PEA cardiac arrest, ROSC achieved after one minute of CPR. Patient was intubated and PCCM consulted. He was extubated on 9/20 but on 9/21 he went into PEA arrest, required two rounds of CPR, epi x1 and bicarb x1. He became bradycardic and was reintubated.  Patient extubated in morning on 9/26 to 3L oxygen via nasal cannula. CXR on 9/25 showed worsening aeration with enlarging large right pleural effusion and areas of atelectasis and/or consolidation throughout lungs bilaterally, right >left. Repeat CXR on 9/26 showed mild improvement.    Assessment / Plan / Recommendation  Clinical  Impression  Patient is presenting with a cognitive based dysphagia but with pharyngeal phase of swallow appearing WFL.  He required cues to maintain adequate alertness and attention, exhibited reduced awareness to boluses initially when ice chips given. He exhibited adequate though mildly delayed mastication of ice chips. Swallow initiation appeared timely with ice chips and water sips. No overt s/s aspiration observed even with consecutive swallows. No change in voice or vitals. SLP recommending intiate PO diet of clear liquids. As his mentation improves, he will likely be able to advance to solids without difficulty. SLP Visit Diagnosis: Dysphagia, unspecified (R13.10)    Aspiration Risk  Mild aspiration risk    Diet Recommendation Thin liquid    Liquid Administration via: Cup;Straw Medication Administration: Whole meds with puree Supervision: Full supervision/cueing for compensatory strategies;Staff to assist with self feeding Compensations: Slow rate;Small sips/bites Postural Changes: Seated upright at 90 degrees    Other  Recommendations Oral Care Recommendations: Oral care BID;Oral care before and after PO    Recommendations for follow up therapy are one component of a multi-disciplinary discharge planning process, led by the attending physician.  Recommendations may be updated based on patient status, additional functional criteria and insurance authorization.  Follow up Recommendations No SLP follow up      Assistance Recommended at Discharge    Functional Status Assessment Patient has had a recent decline in their functional status and demonstrates the ability to make significant improvements in function in a reasonable and predictable amount of time.  Frequency and Duration min 2x/week  1 week       Prognosis  Prognosis for improved oropharyngeal function: Good      Swallow Study   General Date of Onset: 05/25/23 HPI: Patient is a 32 y.o. male with PMH: seizures, HTN,  asthma. He presented to the hospital on 05/25/23 after having a witnessed seizure. EMS was called and when they arrived, patient was unresponsive. On route to ED, patient became bradycardic and went into PEA cardiac arrest, ROSC was achieved after two minutes but in ED, patient went into PEA cardiac arrest, ROSC achieved after one minute of CPR. Patient was intubated and PCCM consulted. He was extubated on 9/20 but on 9/21 he went into PEA arrest, required two rounds of CPR, epi x1 and bicarb x1. He became bradycardic and was reintubated.  Patient extubated in morning on 9/26 to 3L oxygen via nasal cannula. CXR on 9/25 showed worsening aeration with enlarging large right pleural effusion and areas of atelectasis and/or consolidation throughout lungs bilaterally, right >left. Repeat CXR on 9/26 showed mild improvement. Type of Study: Bedside Swallow Evaluation Previous Swallow Assessment: none found Diet Prior to this Study: NPO Temperature Spikes Noted: No Respiratory Status: Nasal cannula History of Recent Intubation: Yes Total duration of intubation (days): 8 days Date extubated: 06/01/23 (intubated 9/19-9/20 and 9/21-9/26) Behavior/Cognition: Alert;Cooperative;Pleasant mood;Confused Oral Cavity Assessment: Within Functional Limits Oral Care Completed by SLP: Yes Oral Cavity - Dentition: Adequate natural dentition Self-Feeding Abilities: Total assist Patient Positioning: Upright in bed Baseline Vocal Quality: Hoarse;Other (comment) (clear, mildly hoarse) Volitional Cough: Cognitively unable to elicit Volitional Swallow: Unable to elicit    Oral/Motor/Sensory Function Overall Oral Motor/Sensory Function: Within functional limits   Ice Chips Ice chips: Impaired Oral Phase Impairments: Poor awareness of bolus Pharyngeal Phase Impairments: Other (comments) (no overt s/s)   Thin Liquid Thin Liquid: Within functional limits Presentation: Cup    Nectar Thick     Honey Thick     Puree Puree: Not  tested   Solid     Solid: Not tested     Angela Nevin, MA, CCC-SLP Speech Therapy

## 2023-06-02 DIAGNOSIS — I469 Cardiac arrest, cause unspecified: Secondary | ICD-10-CM | POA: Diagnosis not present

## 2023-06-02 LAB — HEMOGLOBIN AND HEMATOCRIT, BLOOD
HCT: 33 % — ABNORMAL LOW (ref 39.0–52.0)
Hemoglobin: 10.7 g/dL — ABNORMAL LOW (ref 13.0–17.0)

## 2023-06-02 LAB — GLUCOSE, CAPILLARY
Glucose-Capillary: 82 mg/dL (ref 70–99)
Glucose-Capillary: 62 mg/dL — ABNORMAL LOW (ref 70–99)
Glucose-Capillary: 71 mg/dL (ref 70–99)
Glucose-Capillary: 82 mg/dL (ref 70–99)
Glucose-Capillary: 76 mg/dL (ref 70–99)
Glucose-Capillary: 75 mg/dL (ref 70–99)

## 2023-06-02 LAB — COMPREHENSIVE METABOLIC PANEL
ALT: 128 U/L — ABNORMAL HIGH (ref 0–44)
AST: 89 U/L — ABNORMAL HIGH (ref 15–41)
Albumin: 2.2 g/dL — ABNORMAL LOW (ref 3.5–5.0)
Alkaline Phosphatase: 87 U/L (ref 38–126)
Anion gap: 17 — ABNORMAL HIGH (ref 5–15)
BUN: 37 mg/dL — ABNORMAL HIGH (ref 6–20)
CO2: 30 mmol/L (ref 22–32)
Calcium: 8.4 mg/dL — ABNORMAL LOW (ref 8.9–10.3)
Chloride: 92 mmol/L — ABNORMAL LOW (ref 98–111)
Creatinine, Ser: 4.29 mg/dL — ABNORMAL HIGH (ref 0.61–1.24)
GFR, Estimated: 18 mL/min — ABNORMAL LOW (ref >60)
Glucose, Bld: 85 mg/dL (ref 70–99)
Potassium: 3.5 mmol/L (ref 3.5–5.1)
Sodium: 139 mmol/L (ref 135–145)
Total Bilirubin: 2.1 mg/dL — ABNORMAL HIGH (ref 0.3–1.2)
Total Protein: 5.2 g/dL — ABNORMAL LOW (ref 6.5–8.1)

## 2023-06-02 LAB — COOXEMETRY PANEL
Carboxyhemoglobin: 2.9 % — ABNORMAL HIGH (ref 0.5–1.5)
Methemoglobin: <0.7 % (ref 0.0–1.5)
O2 Saturation: 75.9 %
Total hemoglobin: 12.1 g/dL (ref 12.0–16.0)

## 2023-06-02 LAB — CBC
HCT: 33.1 % — ABNORMAL LOW (ref 39.0–52.0)
Hemoglobin: 10.6 g/dL — ABNORMAL LOW (ref 13.0–17.0)
MCH: 29.4 pg (ref 26.0–34.0)
MCHC: 32 g/dL (ref 30.0–36.0)
MCV: 91.7 fL (ref 80.0–100.0)
Platelets: 98 10*3/uL — ABNORMAL LOW (ref 150–400)
RBC: 3.61 MIL/uL — ABNORMAL LOW (ref 4.22–5.81)
RDW: 17.7 % — ABNORMAL HIGH (ref 11.5–15.5)
WBC: 10.5 10*3/uL (ref 4.0–10.5)
nRBC: 0.2 % (ref 0.0–0.2)

## 2023-06-02 LAB — HEPARIN LEVEL (UNFRACTIONATED)
Heparin Unfractionated: 0.21 [IU]/mL — ABNORMAL LOW (ref 0.30–0.70)
Heparin Unfractionated: 0.15 [IU]/mL — ABNORMAL LOW (ref 0.30–0.70)

## 2023-06-02 MED ORDER — OLANZAPINE 5 MG PO TBDP
5.0000 mg | ORAL_TABLET | Freq: Every day | ORAL | Status: DC
  Administered 2023-06-02 – 2023-06-05 (×4): 5 mg via ORAL
  Filled 2023-06-02 (×6): qty 1

## 2023-06-02 MED ORDER — ISOSORBIDE MONONITRATE ER 30 MG PO TB24
30.0000 mg | ORAL_TABLET | Freq: Every day | ORAL | Status: DC
  Administered 2023-06-02 – 2023-06-10 (×9): 30 mg via ORAL
  Filled 2023-06-02 (×9): qty 1

## 2023-06-02 MED ORDER — HYDRALAZINE HCL 25 MG PO TABS
25.0000 mg | ORAL_TABLET | Freq: Three times a day (TID) | ORAL | Status: DC
  Administered 2023-06-02 – 2023-06-05 (×8): 25 mg via ORAL
  Filled 2023-06-02 (×8): qty 1

## 2023-06-02 MED ORDER — FUROSEMIDE 10 MG/ML IJ SOLN
120.0000 mg | Freq: Once | INTRAVENOUS | Status: AC
  Administered 2023-06-02: 120 mg via INTRAVENOUS
  Filled 2023-06-02: qty 10

## 2023-06-02 MED ORDER — BOOST / RESOURCE BREEZE PO LIQD CUSTOM
1.0000 | Freq: Three times a day (TID) | ORAL | Status: DC
  Administered 2023-06-10 (×2): 1 via ORAL
  Filled 2023-06-02: qty 1

## 2023-06-02 MED ORDER — HEPARIN (PORCINE) 25000 UT/250ML-% IV SOLN
2200.0000 [IU]/h | INTRAVENOUS | Status: DC
  Administered 2023-06-02: 1700 [IU]/h via INTRAVENOUS
  Administered 2023-06-03: 2100 [IU]/h via INTRAVENOUS
  Administered 2023-06-03: 1950 [IU]/h via INTRAVENOUS
  Administered 2023-06-04: 2200 [IU]/h via INTRAVENOUS
  Administered 2023-06-04: 2100 [IU]/h via INTRAVENOUS
  Administered 2023-06-05: 2200 [IU]/h via INTRAVENOUS
  Filled 2023-06-02 (×6): qty 250

## 2023-06-02 MED ORDER — POTASSIUM CHLORIDE CRYS ER 20 MEQ PO TBCR
20.0000 meq | EXTENDED_RELEASE_TABLET | Freq: Once | ORAL | Status: AC
  Administered 2023-06-02: 20 meq via ORAL
  Filled 2023-06-02: qty 1

## 2023-06-02 MED ORDER — POTASSIUM CHLORIDE CRYS ER 20 MEQ PO TBCR
40.0000 meq | EXTENDED_RELEASE_TABLET | Freq: Once | ORAL | Status: AC
  Administered 2023-06-02: 40 meq via ORAL
  Filled 2023-06-02: qty 2

## 2023-06-02 MED ORDER — METOLAZONE 5 MG PO TABS
5.0000 mg | ORAL_TABLET | Freq: Once | ORAL | Status: AC
  Administered 2023-06-02: 5 mg via ORAL
  Filled 2023-06-02: qty 1

## 2023-06-02 NOTE — Progress Notes (Signed)
[facility] Resident Progress Note  Assessment/plan:  32 year old with seizures, hypertension, and asthma admitted after seizure outside of hospital complicated by PEA cardiac arrest x 2. Improved to extubation but then rearrested with PEA in ICU. 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  AKI ATN in setting of multiple arrests and cardiogenic shock. Rhabdomyolysis in setting of seizure and possible prolonged downtime may be contributor. Considered renal vein thrombosis in setting of coagulopathy. Note persistent hematuria since 2023 but GN workup this admission negative. AIN less likely. Improving creatinine in interval. Good UOP on furosemide infusion. Continue course for now.   Avoid nephrotoxic medications including NSAIDs and iodinated intravenous contrast exposure unless the latter is absolutely indicated.  Preferred narcotic agents for pain control are hydromorphone, fentanyl, and methadone. Morphine should not be used. Avoid Baclofen and avoid oral sodium phosphate and magnesium citrate based laxatives / bowel preps. Continue strict Input and Output monitoring.   Hypervolemia Aggressively and appropriately resuscitated but now net +30 L since admit. Respiratory status stable, still +anasarca.   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 Afebrile.  Coagulopathy Query acute liver failure due to shock versus stasis in setting of prolonged downtime. Less likely DIC with elevated fibrinogen, no schistocytes on smear. Negative antiphospholipid workup.  Acidemia, resolved  Final recommendations pending attending attestation.  Subjective:  Feeling okay today. No difficulty breathing at present.  Scheduled Meds:  arformoterol  15 mcg Nebulization BID   budesonide (PULMICORT) nebulizer solution  0.5 mg  Nebulization BID   Chlorhexidine Gluconate Cloth  6 each Topical Daily   insulin aspart  0-9 Units Subcutaneous Q4H   levETIRAcetam  500 mg Oral BID   mouth rinse  15 mL Mouth Rinse 4 times per day   pantoprazole (PROTONIX) IV  40 mg Intravenous Q12H   sodium chloride flush  10-40 mL Intracatheter Q12H   thiamine  100 mg Oral Daily   Continuous Infusions:  sodium chloride Stopped (05/31/23 1438)   sodium chloride Stopped (05/28/23 1959)   dexmedetomidine (PRECEDEX) IV infusion Stopped (06/02/23 1610)   feeding supplement (VITAL 1.5 CAL) Stopped (06/01/23 0700)   furosemide (LASIX) 200 mg in dextrose 5 % 100 mL (2 mg/mL) infusion 10 mg/hr (06/02/23 0600)   heparin     PRN Meds:.sodium chloride, docusate, fentaNYL (SUBLIMAZE) injection, ipratropium-albuterol, lip balm, ondansetron (ZOFRAN) IV, mouth rinse, mouth rinse, polyethylene glycol, sodium chloride flush, white petrolatum  Objective:  BP (!) 133/109   Pulse 73   Temp 97.7 F (36.5 C) (Axillary)   Resp (!) 30   Wt 85.1 kg   SpO2 94%   BMI 30.28 kg/m   No distress Heart rate normal, rhythm regular, no appreciable murmurs Breathing comfortably on room air, lungs clear without crackles Anasarca Skin warm and dry, some serous blistering, no rashes Alert and oriented to person, place, and date Pleasant and congruent affect  Weight change: -0.8 kg   Intake/Output Summary (Last 24 hours) at 06/02/2023 0858 Last data filed at 06/02/2023 0600 Gross per 24 hour  Intake 904.59 ml  Output 1455 ml  Net -550.41 ml      Latest Ref Rng & Units 06/02/2023    5:29 AM 06/01/2023    4:52 AM 05/31/2023    4:53 PM  BMP  Glucose 70 - 99 mg/dL 85  81  960   BUN 6 - 20 mg/dL 37  31  29   Creatinine 0.61 - 1.24 mg/dL 7.82  9.56  2.13   Sodium 135 - 145 mmol/L 139  139  138   Potassium 3.5 - 5.1 mmol/L 3.5  3.5  3.8   Chloride 98 - 111 mmol/L 92  94  91   CO2 22 - 32 mmol/L 30  31  32   Calcium 8.9 - 10.3 mg/dL 8.4  8.2  7.8        Latest Ref Rng & Units 06/02/2023    5:29 AM 06/01/2023    4:52 AM 05/31/2023    4:53 PM  CBC  WBC 4.0 - 10.5 K/uL 10.5  9.3  8.0   Hemoglobin 13.0 - 17.0 g/dL 08.6  9.4  9.4   Hematocrit 39.0 - 52.0 % 33.1  28.7  28.8   Platelets 150 - 400 K/uL 98  62  55

## 2023-06-02 NOTE — Progress Notes (Signed)
Speech Language Pathology Treatment: Dysphagia  Patient Details Name: Timothy Hubbard MRN: [id number] DOB: 07-21-1991 Today's Date: 06/02/2023 Time: 7829-5621 SLP Time Calculation (min) (ACUTE ONLY): 12 min  Assessment / Plan / Recommendation Clinical Impression  Pt demonstrates no signs of aspiration. He is attentive and appropriate, able to [facility] and ready to eat regular foods. Pt is unable to cut up solids and RN says pre cut foods would be best, will order dys 3/thin. No SLP f/u needed will sign off.   HPI HPI: Patient is a 32 y.o. male with PMH: seizures, HTN, asthma. He presented to the hospital on 05/25/23 after having a witnessed seizure. EMS was called and when they arrived, patient was unresponsive. On route to ED, patient became bradycardic and went into PEA cardiac arrest, ROSC was achieved after two minutes but in ED, patient went into PEA cardiac arrest, ROSC achieved after one minute of CPR. Patient was intubated and PCCM consulted. He was extubated on 9/20 but on 9/21 he went into PEA arrest, required two rounds of CPR, epi x1 and bicarb x1. He became bradycardic and was reintubated.  Patient extubated in morning on 9/26 to 3L oxygen via nasal cannula. CXR on 9/25 showed worsening aeration with enlarging large right pleural effusion and areas of atelectasis and/or consolidation throughout lungs bilaterally, right >left. Repeat CXR on 9/26 showed mild improvement.      SLP Plan  All goals met      Recommendations for follow up therapy are one component of a multi-disciplinary discharge planning process, led by the attending physician.  Recommendations may be updated based on patient status, additional functional criteria and insurance authorization.    Recommendations  Diet recommendations: Dysphagia 3 (mechanical soft);Thin liquid Liquids provided via: Cup;Straw Medication Administration: Whole meds with liquid Supervision: Staff to assist with self feeding (may needs set up  assist)                              All goals met     Jae Bruck, Riley Nearing  06/02/2023, 2:13 PM

## 2023-06-02 NOTE — Progress Notes (Signed)
Timothy Hubbard is now extubated.  Very impressed by this.  It was nice being able to talk with him.  His hepatic function is still continuing to improve.  His bilirubin is 2.1.  His SGPT is 128 SGOT 89.  His albumin is only 2.2.  Hopefully now that he is extubated, he will be able to eat better.  His renal function is stable with a BUN of 37 creatinine 4.29.  His platelet count is improving.  White cell count 10.5.  Hemoglobin 10.6.  Platelet count 98,000.  He is on a heparin infusion.  We are probably converting over to Lovenox now.  He does the DVT in the right lower leg.  Again, I think he will need some form of anticoagulation for probably about 6 months.  His vital signs are temperature 97.6.  Pulse 86.  Blood pressure 133/109.  His lungs sound relatively clear bilaterally.  He has good air movement bilaterally.  Cardiac exam regular rate and rhythm.  He has 1/6 systolic ejection murmur.  Abdomen is soft.  He has no obvious fluid wave.  There is no guarding or rebound tenderness.  He has some hepatomegaly.  There is no splenomegaly.  Extremities shows maybe little swelling in the right lower leg.  Neurological exam shows no focal neurological deficits.  Timothy Hubbard had a seizure and cardiac arrest.  He developed a "shock" liver.  He had a marked coagulopathy secondary to the shock liver.  He then developed a DVT in the right leg.  I suspect the DVT probably is related to him and just not being active.  He currently is on heparin.  His platelet count is improving nicely.  His LFTs are improving nicely.  I will go ahead and try to get him on Lovenox.  I would do look twice daily Lovenox.  Hopefully, his renal function will continue to improve slowly.  Again, when he is ready to go home, I will get him on oral anticoagulation.  I will keep her on oral anticoagulation probably for about 6 months.    [name], MD   Psalms 37:4

## 2023-06-02 NOTE — Hospital Course (Addendum)
Timothy Hubbard. Gaeta is a 32 year old male with PMH of seizures, hypertension and asthma who presented after witnessed seizure with PEA arrest x 2 who was intubated and transferred to ICU 9/19 and out to FMTS on 9/28.  Consultants: Neurology, Heme/onc, Pulm, Nephrology, Surgery  Pt Overview and Major Events to Date:  9/19: 2 PEA arrests ROSC achieved, admit to ICU; seizure and cardiac arrest 9/20: extubated 05/27/2023: PEA arrest and after woke up and was confused.  Total of 2 rounds of CPR with epi and bicarb x 1.  But became bradycardic > intubated.  Had A-line placed.  Also central line ascites tap did show blood.  Was given 4 units PRBC, 4 units FFP and 2 units platelets and 1 unit of cryo major transfusion protocol Cc consult for hemoperitoneum.  DIC panel positive.  He had some oozing from mouth and IV sites EEG showed symmetric low voltage no/seizure activity 06/01/2023: Extubated 06/03/2023: Transferred to FMTS   Cardiac arrest  Distributive Shock 2 PEA arrests on admission. Additional PEA arrest in ICU 9/21. Was placed on multiple pressors which were weaned and patient left ICU on 9/28.   Seizure  acute encephalopathy Presented with witnessed seizure at home. Valproic acid level subtherapeutic. Ethanol, salicylate, acetaminophen level all within normal limits. UDS positive for THC and benzos. Neurology was consulted who recommended continuing keppra 500 mg BID.  On 10/2 patient was noted to have jerking/shaking to L upper extremity with strength and sensation intact. He had EEG which did not show any seizure activity. Shortly after EEG was discontinued patient had tonic-clonic seizure; given ativan 2mg  x2 and keppra 1000mg . Longterm EEG showed multiple seizures and epileptogenicity. Neurology recommended increasing Keppra to 1000mg  BID.  In the evening of 10/2 the patient began experiencing new onset Left arm weakness and numbness. A code stroke was called and pt underwent imaging. Head CT  showed concern for acute to subacute infarct. Brain MRI showed areas of edema and concern for acute hypoxic ischemic injury. All of this thought to have arisen secondary to pt's prior PEAs.   On 10/5 after patient had not exhibited any further seizure activity for 48 hours, neurology was comfortable discontinuing long-term EEG.  Pt was discharged on Keppra 1000mg  BID with intranasal Diazepam for breakthrough seizures.  Shock Liver LFTs elevated 5000s after initiating pressors. Improved off of pressors. LFTs downtrended appropriately to AST 35 and ALT 61 by 10/1.   Coagulopathy  Hemoperitoneum Possibly related to acute liver failure due to shock versus stasis in setting of prolonged downtime. Ddx included DIC with elevated fibrinogen however no schistocytes on smear. Negative antiphospholipid workup. On 9/21 had oozing from lines, mouth and central line ascites tap with blood. CT Abd/Pelvis with moderate-to-large volume abdominopelvic hemorrhage. Surgery was consulted who did not have to perform procedure inpatient due to stabilization. Repeat CTA without source of active extravasation. ANCA labs negative.  AKI  Normal Cr baseline. Cr rose to high 4's during ICU stay. Likely ATN in setting of multiple arrests and cardiogenic shock. Rhabdomyolysis in setting of seizure and possible prolonged downtime may be contributor. Considered renal vein thrombosis in setting of coagulopathy. Persistent hematuria since 2023 but GN workup this admission negative. Placed on lasix infusion 9/25. Discontinued Lasix on 10/3. Creatinine at discharge was 1.28.  HFrEF Echo with EF of 20-25%. Aggressively and appropriately resuscitated but net ~30 L at one point during ICU stay. Cardiology was consulted who recommended hydralazine 50mg  TID, Imdur 30mg , Coreg 6.25 BID, and spironolactone 25mg  daily.  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 Repeat cultures negative.   Antibiotic  course - pip/tazo-9/19-9/23, cefepime 9/19, vancomycin 9/19, unasyn 9/23-9/26  Acute DVT of R Peroneal Vein Was found to have acute DVT of R Peroneal Vein. Anticoagulated with heparin and then transitioned to Eliquis. After seizure on 10/2 patient was temporarily transitioned to Lovenox as he was not tolerating PO medications. He restarted Eliquis on 10/3. He needs to remain on this for 6 months per heme/onc.  Hyperglycemia Required insulin drip in ICU d/t hyperglycemia to 400s. Normalized off of drip. A1c 5.7.   Other chronic conditions were medically managed with home medications and formulary alternatives as necessary (mood disorder - olanzapine 5 mg nightly)  PCP Follow-up Recommendations: Consider referral to GI for hemorrhoid banding Repeat BMP to check creatinine and potassium Repeat UA and Urine Protein/Creatinine ratio on follow up.  Follow up with Nephrology in 3-4 weeks, ensure follow up [facility] 07/18/2023 at 8:20 AM with Dr. Valentino Nose) When seizures stabilize, consider cognitive testing (given all that pt has been through with seizures, etc.) nephrology follow-up appointment is at [facility] 07/18/23 at 8:20 am with Dr. Valentino Nose  Consider DC protonix if not needed outpatient. Consider repeat CXR in 2 weeks, around 07/02/23

## 2023-06-02 NOTE — Progress Notes (Signed)
ANTICOAGULATION CONSULT NOTE  Pharmacy Consult for heparin Indication: DVT  Allergies  Allergen Reactions   Ibuprofen Anaphylaxis    Patient Measurements: Weight: 85.1 kg (187 lb 9.8 oz) Heparin Dosing Weight: 82 kg  Vital Signs: Temp: 97.7 F (36.5 C) (09/27 0757) Temp Source: Axillary (09/27 0757) BP: 133/109 (09/27 0600) Pulse Rate: 73 (09/27 0830)  Labs: Recent Labs    05/31/23 1653 06/01/23 0200 06/01/23 0452 06/01/23 1144 06/01/23 2059 06/02/23 0529  HGB 9.4*  --  9.4*  --   --  10.6*  HCT 28.8*  --  28.7*  --   --  33.1*  PLT 55*  --  62*  --   --  98*  HEPARINUNFRC <0.10*   < > 0.16* 0.20* 0.30 0.21*  CREATININE 4.54*  --  4.61*  --   --  4.29*   < > = values in this interval not displayed.    Estimated Creatinine Clearance: 25 mL/min (A) (by C-G formula based on SCr of 4.29 mg/dL (H)).   Assessment: 32 yo male who presented with cardiac arrest 2/2 to seizure. Patient now presenting with new DVT. Notable history of abdominopelvic hemorrhage on CT angio (9/21) with massive transfusion. Previously heparin gtt held 2/2 to thrombocytopenia (Plt 20s). Pharmacy consulted restart heparin in the setting of DVT.  Per CCM, okay to anticoagulate using heparin.   Heparin level subtherapeutic at 0.21 on 1600 units/hr. Per discussion with RN, recent blood in foley bag but likely 2/2 pulling on foley catheter. After replacement, no further blood and urine clear. CBC stable and platelets continue to trend up.    Of note, heme/onc wanted to switch pt to lovenox but after discussion with CCM regarding renal dysfunction and recent bleeding, agreed to continue on heparin gtt for now.   Goal of Therapy:  Heparin level 0.3-0.5 units/ml Monitor platelets by anticoagulation protocol: Yes   Plan:  Increase heparin infusion to 1700 units/hr  8 hour heparin level  Daily heparin level and CBC while on heparin Monitor for s/sx of bleeding   Thank you for involving pharmacy in the  patient's care.   Theotis Burrow, PharmD PGY1 Acute Care Pharmacy Resident  06/02/2023 8:43 AM

## 2023-06-02 NOTE — Progress Notes (Signed)
Patient refused his CHG bath and peri care.

## 2023-06-02 NOTE — Progress Notes (Addendum)
NAME:  Timothy Hubbard, MRN:  [id number], DOB:  1990-12-15, LOS: 8 ADMISSION DATE:  05/25/2023, CONSULTATION DATE:  05/25/2023 REFERRING MD:  EDP, CHIEF COMPLAINT:  Seizures and cardiac arrest    History of Present Illness:  Patient is a [name] w/ pertinent PMH seizures, asthma, htn, mood disorder presents to [facility] ED on 9/19 w/ seizure and cardiac arrest.   Per family patient compliant w/ seizure medications.  Had a witnessed seizure at home. EMS arrived and patient unresponsive. On route to ED patient became bradycardic 20-30 and pea arrested. 1 round cpr performed. Patient breathing spontaneously post arrest. Started on epi drip. On ED arrival patient altered but withdrew to painful stimuli. During intubation patient pea arrested and rosc after 1 round of cpr. EKG sinus tachy 130s. Patient remained hypotensive post intubation and cvl placed. Currently on 3 pressors. Temp 92 F and placed on bair hugger. LA 14. Given IV fluids and cultures obtained and placed on broad spectrum antibiotics. CXR w/ ett in good position; no infiltrate appreciated. Abg 6.88, 56, 379, 11. Given 2 amps of bicarb and ordered bicarb drip. CTA head, abd, pelvis ordered/pending. PCCM consulted for icu admission.  Pertinent  Medical History  past medical history of Acute encephalopathy (03/10/2022), Adenomatous polyp of descending colon, Asthma, Encephalopathy acute (03/01/2022), Hematochezia, HTN (hypertension), Hypokalemia (03/04/2022), Need for hepatitis C screening test (04/27/2022), and Seizures (HCC)  Significant Hospital Events: Including procedures, antibiotic start and stop dates in addition to other pertinent events   9/19 admit to [facility]; seizure and cardiac arrest 9/20 extubated 05/27/2023: PEA arrest and after woke up and was confused.  Total of 2 rounds of CPR with epi and bicarb x 1.  But became bradycardic > intubated.  Had A-line placed.  Also central line ascites tap did show blood.  Was given 4 units PRBC, 4 units  FFP and 2 units platelets and 1 unit of cryo major transfusion protocol Cc consult for hemoperitoneum.  DIC panel positive.  He had some oozing from mouth and IV sites EEG showed symmetric low voltage no/seizure activity 06/01/2023: Extubated  Interim History / Subjective:  Overnight events: No acute events overnight  Patient was extubated yesterday.  Patient is up in recliner today.  He states he is doing well.  He states the vision loss has improved.  Objective   Blood pressure (!) 133/109, pulse 86, temperature 97.6 F (36.4 C), temperature source Oral, resp. rate (!) 30, weight 85.1 kg, SpO2 100%.    FiO2 (%):  [40 %] 40 %   Intake/Output Summary (Last 24 hours) at 06/02/2023 0755 Last data filed at 06/02/2023 0600 Gross per 24 hour  Intake 944.51 ml  Output 1680 ml  Net -735.49 ml   Filed Weights   05/31/23 0422 06/01/23 0500 06/02/23 0451  Weight: 88.1 kg 85.9 kg 85.1 kg    Examination: General: Extubated, in some distress, able to follow all instructions HENT: Normocephalic, atraumatic Eyes: Right eye with some conjunctival hemorrhage noted, EOMI, pupils equal and reactive Lungs: Decreased breath sounds bilaterally, crackles noted to right lower lung Cardiovascular: Regular rate and rhythm, no murmurs, rubs, or gallops Abdomen: Abdomen soft, nontender Extremities: Bilateral lower extremity swelling appreciated, 2+ pitting edema bilaterally Neuro: Able to follow instructions, no focal neurological deficits appreciated GU: Increasing urine output  Resolved Hospital Problem list   #Lactic acidosis #Sepsis #Blurry vision  Assessment & Plan:  This is a 32 year old male with a past medical history of seizures, asthma, hypertension who  presents to the ED for concerns of seizure and had out-of-hospital cardiac arrest.   #Out-of-hospital cardiac arrest #Acute hypoxemic respiratory failure now extubated Patient is doing well.  No acute concerns at this time.  Patient has  been extubated.  Working with PT and OT.  On some supplemental oxygen, but overall improving well.  Weaned off all pressors.  Patient is more awake this morning.  He is able to follow all instructions.  He is able to show that he can protect his own airway.  Patient was extubated.  He has been weaned off all pressors. -Continue with supportive care -Continue to monitor vital signs closely -Can get patient up with PT/OT -Will call cardiology to evaluate patient and potentially get him plugged into the heart failure team  #Acute right peroneal vein DVT Patient is improving well.  No acute concerns.  Continue heparin drip.  Plan will be for patient to transition to Lovenox twice daily versus oral anticoagulation.  This will be for 6 months.   -Hematology/oncology following -Transfuse platelet if platelet drops below 10, but if patient does start bleeding, transfuse if platelets less than 20 -Continue heparin, can discontinue with CBC shows platelet below 30 -Trend CBC  #Seizure disorder No further seizures.  Patient is currently on Keppra.  No further seizures -Neurology following -Continue Keppra 500 mg twice daily  #Shock liver #Anemia of critical illness #Hemoperitoneum #Thrombocytopenia Improving well.  Liver enzymes are trending down.  Hemoglobin is highest that has been in a while.  Platelets are into the 90s.  -Hemoglobin 9.4 today -Hematology following, appreciate recommendations -Continue heparin infusion  #HFrEF Patient continues to be volume overloaded.  Almost up 28 L during hospitalization.  Continue Lasix drip.  Most recent echo showing left ventricular ejection fraction of 20 to 25%.  Will need to hold GDMT given acute renal dysfunction.    -Continue Lasix infusion -Need to hold GDMT in setting of acute kidney injury -Strict I's and O's  #Right pleural effusion Patient has been extubated.  Currently on room air.  There is improvement of the pleural effusion.  Still  little hypoxic. -Continue Lasix infusion -Continue to monitor for worsening respiratory status. -Will continue to follow along for pleural effusion  #Acute renal failure Creatinine has finally down trended.  Lasix has proved effective.  Nephrology following.  Patient has avoided CRRT.  This is promising.  Urine output has been good.  Electrolytes have been labile.  -Nephrology following, appreciate recommendations -Acute renal failure likely in setting of rhabdo plus ATN -Continue Lasix infusion -Monitor strict ins and outs  Patient is stable to transfer to floor today.  Discussed with family medicine residency who will take over care.  Will continue to follow for pleural effusion in case patient needs thoracentesis.  Less likely to need a thoracentesis as patient is improving with diuresing.  Best Practice (right click and "Reselect all SmartList Selections" daily)   Diet/type: tubefeeds DVT prophylaxis: Systemic heparin GI prophylaxis: PPI Lines: N/A and Central line Foley:  Yes, and it is still needed Code Status:  full code  Labs   CBC: Recent Labs  Lab 05/26/23 1401 05/26/23 1404 05/27/23 1728 05/27/23 1917 05/28/23 1053 05/28/23 1648 05/30/23 0542 05/31/23 0400 05/31/23 1653 06/01/23 0452 06/02/23 0529  WBC 5.5   < > 5.0   < > 8.6   < > 6.5 6.6 8.0 9.3 10.5  NEUTROABS 3.1  --  3.2  --  6.8  --   --   --   --   --   --  HGB 6.4*   < > 10.0*   < > 9.8*   < > 9.0* 9.3* 9.4* 9.4* 10.6*  HCT 18.8*   < > 29.0*   < > 27.9*   < > 26.8* 28.3* 28.8* 28.7* 33.1*  MCV 90.4   < > 81.2   < > 81.1   < > 85.6 86.3 86.7 88.6 91.7  PLT 32*   < > 44*  43*   < > 46*   < > 20*  23* 46* 55* 62* 98*   < > = values in this interval not displayed.    Basic Metabolic Panel: Recent Labs  Lab 05/30/23 0542 05/30/23 1220 05/30/23 1733 05/31/23 0400 05/31/23 1653 06/01/23 0452 06/02/23 0529  NA 136 136  --  137 138 139 139  K 3.6 3.5  --  3.3* 3.8 3.5 3.5  CL 86* 90*  --  91*  91* 94* 92*  CO2 30 29  --  31 32 31 30  GLUCOSE 115* 111*  --  119* 104* 81 85  BUN 23* 25*  --  29* 29* 31* 37*  CREATININE 4.31* 4.38*  --  4.39* 4.54* 4.61* 4.29*  CALCIUM 7.9* 8.0*  --  8.0* 7.8* 8.2* 8.4*  MG 2.2  --  2.2 2.0 2.0 2.0  --   PHOS 4.5  --  4.4 4.1 3.5 3.6  --    GFR: Estimated Creatinine Clearance: 25 mL/min (A) (by C-G formula based on SCr of 4.29 mg/dL (H)). Recent Labs  Lab 05/26/23 1401 05/26/23 2316 05/27/23 0034 05/27/23 0408 05/27/23 1514 05/27/23 1728 05/27/23 2122 05/28/23 0302 05/31/23 0400 05/31/23 1653 06/01/23 0452 06/02/23 0529  WBC 5.5   < >  --    < >  --    < >  --    < > 6.6 8.0 9.3 10.5  LATICACIDVEN >9.0*  --  7.3*  --  >9.0*  --  3.0*  --   --   --   --   --    < > = values in this interval not displayed.    Liver Function Tests: Recent Labs  Lab 05/29/23 0226 05/29/23 0927 05/30/23 0542 05/31/23 0400 06/01/23 0452 06/02/23 0529  AST 600*  --  211* 151* 126* 89*  ALT 254*  --  203* 185* 154* 128*  ALKPHOS 74  --  74 84 88 87  BILITOT 3.9* 3.6* 2.6* 2.1* 2.3* 2.1*  PROT 5.7*  --  5.5* 5.7* 5.4* 5.2*  ALBUMIN 2.9*  --  2.6* 2.5* 2.4* 2.2*   No results for input(s): "LIPASE", "AMYLASE" in the last 168 hours. Recent Labs  Lab 05/27/23 1514  AMMONIA 34    ABG    Component Value Date/Time   PHART 7.574 (H) 05/29/2023 0309   PCO2ART 33.7 05/29/2023 0309   PO2ART 96 05/29/2023 0309   HCO3 31.1 (H) 05/29/2023 0309   TCO2 32 05/29/2023 0309   ACIDBASEDEF 15.0 (H) 05/26/2023 0454   O2SAT 98 05/29/2023 0309     Coagulation Profile: Recent Labs  Lab 05/27/23 1917 05/28/23 0302 05/29/23 0226 05/29/23 1231 05/30/23 0542  INR 1.2 1.1 1.1 1.1 1.1    Cardiac Enzymes: Recent Labs  Lab 05/26/23 1401 05/29/23 0927  CKTOTAL 3,654* 1,183*    HbA1C: Hgb A1c MFr Bld  Date/Time Value Ref Range Status  05/26/2023 02:01 PM 5.7 (H) 4.8 - 5.6 % Final    Comment:    (NOTE) Pre diabetes:  5.7%-6.4%  Diabetes:               >6.4%  Glycemic control for   <7.0% adults with diabetes   03/03/2022 08:42 AM <4.2 (L) 4.8 - 5.6 % Final    Comment:    (NOTE) **Verified by repeat analysis**         Prediabetes: 5.7 - 6.4         Diabetes: >6.4         Glycemic control for adults with diabetes: <7.0     CBG: Recent Labs  Lab 06/01/23 1126 06/01/23 1531 06/01/23 1925 06/01/23 2337 06/02/23 0313  GLUCAP 76 75 87 75 82    Review of Systems:   Negative Except what is stated in HPI   Past Medical History:  He,  has a past medical history of Acute encephalopathy (03/10/2022), Adenomatous polyp of descending colon, Asthma, Encephalopathy acute (03/01/2022), Hematochezia, HTN (hypertension), Hypokalemia (03/04/2022), Need for hepatitis C screening test (04/27/2022), and Seizures (HCC).   Surgical History:   Past Surgical History:  Procedure Laterality Date   COLONOSCOPY WITH PROPOFOL N/A 03/14/2022   Procedure: COLONOSCOPY WITH PROPOFOL;  Surgeon: Shellia Cleverly, DO;  Location: [facility];  Service: Gastroenterology;  Laterality: N/A;   POLYPECTOMY  03/14/2022   Procedure: POLYPECTOMY;  Surgeon: Shellia Cleverly, DO;  Location: [facility];  Service: Gastroenterology;;     Social History:   reports that he has been smoking cigarettes. He started smoking about 15 years ago. He has a 7.9 pack-year smoking history. He has never used smokeless tobacco. He reports current alcohol use of about 42.0 standard drinks of alcohol per week. He reports current drug use. Frequency: 7.00 times per week. Drug: Marijuana.   Family History:  His family history includes Cancer in his maternal grandfather; Hypertension in his mother.   Allergies Allergies  Allergen Reactions   Ibuprofen Anaphylaxis     Home Medications  Prior to Admission medications   Medication Sig Start Date End Date Taking? Authorizing Provider  acetaminophen (TYLENOL) 325 MG tablet Take 2 tablets (650 mg total) by mouth every  6 (six) hours as needed for mild pain or moderate pain. 03/30/22  Yes Littie Deeds, MD  albuterol (PROVENTIL) (2.5 MG/3ML) 0.083% nebulizer solution Take 3 mLs (2.5 mg total) by nebulization every 6 (six) hours as needed for wheezing or shortness of breath. 04/04/23  Yes Vonna Drafts, MD  amLODipine (NORVASC) 10 MG tablet Take 1 tablet (10 mg total) by mouth at bedtime. 05/22/23  Yes Hindel, Leah, MD  divalproex (DEPAKOTE) 500 MG DR tablet Take 1 tablet (500 mg total) by mouth 2 (two) times daily. 04/04/23  Yes Vonna Drafts, MD  ferrous sulfate 324 (65 Fe) MG TBEC Take 1 tablet (325 mg total) by mouth daily at 12 noon. 04/04/23  Yes Vonna Drafts, MD  fluconazole (DIFLUCAN) 200 MG tablet Take 0.5 tablets (100 mg total) by mouth daily. 04/24/23  Yes Alwyn Ren, MD  folic acid (FOLVITE) 1 MG tablet TAKE 1 TABLET (1 MG TOTAL) BY MOUTH DAILY (AM) Patient taking differently: Take 1 mg by mouth daily. 04/04/23  Yes Vonna Drafts, MD  lisinopril (ZESTRIL) 5 MG tablet Take 1 tablet (5 mg total) by mouth at bedtime. 05/22/23  Yes Hindel, Leah, MD  Multiple Vitamin (MULTIVITAMIN WITH MINERALS) TABS tablet Take 1 tablet by mouth daily. 04/04/23  Yes Vonna Drafts, MD  naltrexone (DEPADE) 50 MG tablet TAKE 1/2 TABLET (25 MG TOTAL) BY MOUTH DAILY (AM) Patient  taking differently: Take 25 mg by mouth daily. 04/04/23  Yes Vonna Drafts, MD  OLANZapine (ZYPREXA) 5 MG tablet Take 1 tablet (5 mg total) by mouth at bedtime. Future refills will need to come from psychiatry. 04/04/23  Yes Vonna Drafts, MD  pantoprazole (PROTONIX) 40 MG tablet TAKE 1 TABLET (40 MG TOTAL) BY MOUTH DAILY (AM) Patient taking differently: Take 40 mg by mouth daily. 04/04/23  Yes Mahmood, Unknown Foley, MD  polyethylene glycol (MIRALAX / GLYCOLAX) 17 g packet MIX AND DRINK BY MOUTH DAILY AS NEEDED FOR MODERATE CONSTIPATION 04/04/23  Yes Vonna Drafts, MD  VENTOLIN HFA 108 (90 Base) MCG/ACT inhaler Inhale 1-2 puffs into the lungs every 6 (six) hours as  needed for wheezing or shortness of breath. 04/04/23  Yes Vonna Drafts, MD  feeding supplement (ENSURE ENLIVE / ENSURE PLUS) LIQD Take 237 mLs by mouth 2 (two) times daily between meals. Patient not taking: Reported on 05/25/2023 04/24/23   Alwyn Ren, MD  fluticasone [facility]) 50 MCG/ACT nasal spray Place 2 sprays into both nostrils daily. Patient not taking: Reported on 05/25/2023 04/04/23   Vonna Drafts, MD  hydrocortisone (ANUSOL-HC) 2.5 % rectal cream Place 1 Application rectally 2 (two) times daily. Patient not taking: Reported on 05/25/2023 04/04/23   Vonna Drafts, MD  mometasone-formoterol [facility]) 100-5 MCG/ACT AERO INHALE TWO PUFFS INTO THE LUNGS TWICE A DAY Patient not taking: Reported on 05/25/2023 04/04/23   Vonna Drafts, MD  Pedialyte (PEDIALYTE) SOLN Take 240 mLs by mouth every 6 (six) hours. Patient not taking: Reported on 05/25/2023 04/23/23   Alwyn Ren, MD     Critical care time: 40 mins     Modena Slater, DO Internal Medicine Resident PGY-2 Pager: [phone]

## 2023-06-02 NOTE — Consult Note (Addendum)
Advanced Heart Failure Team Consult Note   Primary Physician: Vonna Drafts, MD PCP-Cardiologist:  None  Reason for Consultation: acute systolic heart failure post PEA arrest  HPI:    Timothy Hubbard is seen today for evaluation of acute systolic heart failure post PEA arrest at the request of Dr. Isaiah Serge, PCCM.   Timothy Hubbard is a 32 y.o. AAM with seizures, mood disorder, HTN and asthma. Admitted with seizures>PEA arrest.  Timothy Hubbard presented to the ED after a witnessed seizure followed by cardiac arrest.  On EMS arrival patient was unresponsive post seizure. En route to the hospital he became bradycardic followed by PEA arrest.  On ED arrival during intubation he PEA arrested again.  Admitted to ICU on 3 pressors.  Patient reports he was in normal state of health PTA.  He lives at home with his mom.  Gets SSI.  Reports marijuana and tobacco use. Drinks 3 cans of beer a day.  Denies cocaine. Reports noncompliance with meds at home.  Neurology and PCCM on board.  Since being admitted neurology saw and switched his Depakote to Keppra 2/2 shock liver.  Initial echo EF 60 - 65%. He was extubated 9/21 but unfortunately PEA arrested again.  Required reintubation.  Echo post arrest with EF down to 20-25%. Post arrest ABD US showed ascites, ascites tap with pure blood.   Course complicated by hemoperitoneum and acute anemia in setting of DIC. TMP started.   Surgery consulted CTA with no source of extravasation and no ongoing bleeding, no further surgical intervention required.  Found to have DVT 9/23.  Nephrology later consulted with worsening renal function and suspected ATN.  Placed on Lasix drip for diuresis, at least 20 pounds up from admission.  No indication for CRRT at this time.  AHF team to see for acute systolic heart failure.  Resting comfortably in bed ready for transfer to floor.   Home Medications Prior to Admission medications   Medication Sig Start Date End Date Taking?  Authorizing Provider  acetaminophen (TYLENOL) 325 MG tablet Take 2 tablets (650 mg total) by mouth every 6 (six) hours as needed for mild pain or moderate pain. 03/30/22  Yes Littie Deeds, MD  albuterol (PROVENTIL) (2.5 MG/3ML) 0.083% nebulizer solution Take 3 mLs (2.5 mg total) by nebulization every 6 (six) hours as needed for wheezing or shortness of breath. 04/04/23  Yes Vonna Drafts, MD  amLODipine (NORVASC) 10 MG tablet Take 1 tablet (10 mg total) by mouth at bedtime. 05/22/23  Yes Hindel, Leah, MD  divalproex (DEPAKOTE) 500 MG DR tablet Take 1 tablet (500 mg total) by mouth 2 (two) times daily. 04/04/23  Yes Vonna Drafts, MD  ferrous sulfate 324 (65 Fe) MG TBEC Take 1 tablet (325 mg total) by mouth daily at 12 noon. 04/04/23  Yes Vonna Drafts, MD  fluconazole (DIFLUCAN) 200 MG tablet Take 0.5 tablets (100 mg total) by mouth daily. 04/24/23  Yes Alwyn Ren, MD  folic acid (FOLVITE) 1 MG tablet TAKE 1 TABLET (1 MG TOTAL) BY MOUTH DAILY (AM) Patient taking differently: Take 1 mg by mouth daily. 04/04/23  Yes Vonna Drafts, MD  lisinopril (ZESTRIL) 5 MG tablet Take 1 tablet (5 mg total) by mouth at bedtime. 05/22/23  Yes Hindel, Leah, MD  Multiple Vitamin (MULTIVITAMIN WITH MINERALS) TABS tablet Take 1 tablet by mouth daily. 04/04/23  Yes Vonna Drafts, MD  naltrexone (DEPADE) 50 MG tablet TAKE 1/2 TABLET (25 MG TOTAL) BY MOUTH DAILY (AM) Patient  taking differently: Take 25 mg by mouth daily. 04/04/23  Yes Vonna Drafts, MD  OLANZapine (ZYPREXA) 5 MG tablet Take 1 tablet (5 mg total) by mouth at bedtime. Future refills will need to come from psychiatry. 04/04/23  Yes Vonna Drafts, MD  pantoprazole (PROTONIX) 40 MG tablet TAKE 1 TABLET (40 MG TOTAL) BY MOUTH DAILY (AM) Patient taking differently: Take 40 mg by mouth daily. 04/04/23  Yes Mahmood, Unknown Foley, MD  polyethylene glycol (MIRALAX / GLYCOLAX) 17 g packet MIX AND DRINK BY MOUTH DAILY AS NEEDED FOR MODERATE CONSTIPATION 04/04/23  Yes Vonna Drafts, MD  VENTOLIN HFA 108 (90 Base) MCG/ACT inhaler Inhale 1-2 puffs into the lungs every 6 (six) hours as needed for wheezing or shortness of breath. 04/04/23  Yes Vonna Drafts, MD  feeding supplement (ENSURE ENLIVE / ENSURE PLUS) LIQD Take 237 mLs by mouth 2 (two) times daily between meals. Patient not taking: Reported on 05/25/2023 04/24/23   Alwyn Ren, MD  fluticasone [facility]) 50 MCG/ACT nasal spray Place 2 sprays into both nostrils daily. Patient not taking: Reported on 05/25/2023 04/04/23   Vonna Drafts, MD  hydrocortisone (ANUSOL-HC) 2.5 % rectal cream Place 1 Application rectally 2 (two) times daily. Patient not taking: Reported on 05/25/2023 04/04/23   Vonna Drafts, MD  mometasone-formoterol [facility]) 100-5 MCG/ACT AERO INHALE TWO PUFFS INTO THE LUNGS TWICE A DAY Patient not taking: Reported on 05/25/2023 04/04/23   Vonna Drafts, MD  Pedialyte (PEDIALYTE) SOLN Take 240 mLs by mouth every 6 (six) hours. Patient not taking: Reported on 05/25/2023 04/23/23   Alwyn Ren, MD    Past Medical History: Past Medical History:  Diagnosis Date   Acute encephalopathy 03/10/2022   Adenomatous polyp of descending colon    Asthma    Encephalopathy acute 03/01/2022   Hematochezia    HTN (hypertension)    Hypokalemia 03/04/2022   Need for hepatitis C screening test 04/27/2022   Seizures [facility])     Past Surgical History: Past Surgical History:  Procedure Laterality Date   COLONOSCOPY WITH PROPOFOL N/A 03/14/2022   Procedure: COLONOSCOPY WITH PROPOFOL;  Surgeon: Shellia Cleverly, DO;  Location: [facility] ENDOSCOPY;  Service: Gastroenterology;  Laterality: N/A;   POLYPECTOMY  03/14/2022   Procedure: POLYPECTOMY;  Surgeon: Shellia Cleverly, DO;  Location: [facility] ENDOSCOPY;  Service: Gastroenterology;;    Family History: Family History  Problem Relation Age of Onset   Hypertension Mother    Cancer Maternal Grandfather        doesn't know the type    Social History: Social History    Socioeconomic History   Marital status: Single    Spouse name: Not on file   Number of children: 0   Years of education: Not on file   Highest education level: Not on file  Occupational History   Occupation: disablity    Comment: Doesn't want to discuss  Tobacco Use   Smoking status: Every Day    Current packs/day: 0.50    Average packs/day: 0.5 packs/day for 15.7 years (7.9 ttl pk-yrs)    Types: Cigarettes    Start date: 2009   Smokeless tobacco: Never  Vaping Use   Vaping status: Former  Substance and Sexual Activity   Alcohol use: Yes    Alcohol/week: 42.0 standard drinks of alcohol    Types: 42 Cans of beer per week    Comment: every 2-3 days   Drug use: Yes    Frequency: 7.0 times per week    Types: Marijuana  Comment: 3-5 times per day   Sexual activity: Yes    Partners: Female    Birth control/protection: Condom    Comment: occasiional condom use  Other Topics Concern   Not on file  Social History Narrative   Not on file   Social Determinants of Health   Financial Resource Strain: Medium Risk (04/01/2022)   Overall Financial Resource Strain (CARDIA)    Difficulty of Paying Living Expenses: Somewhat hard  Food Insecurity: No Food Insecurity (04/23/2023)   Hunger Vital Sign    Worried About Running Out of Food in the Last Year: Never true    Ran Out of Food in the Last Year: Never true  Transportation Needs: No Transportation Needs (04/23/2023)   PRAPARE - Administrator, Civil Service (Medical): No    Lack of Transportation (Non-Medical): No  Physical Activity: Not on file  Stress: Stress Concern Present (01/05/2023)   [employer] of Occupational Health - Occupational Stress Questionnaire    Feeling of Stress : To some extent  Social Connections: Not on file    Allergies:  Allergies  Allergen Reactions   Ibuprofen Anaphylaxis    Objective:    Vital Signs:   Temp:  [97.5 F (36.4 C)-98.2 F (36.8 C)] 98.2 F (36.8 C) (09/27  1531) Pulse Rate:  [69-147] 79 (09/27 1500) Resp:  [10-42] 10 (09/27 1500) BP: (110-158)/(86-120) 137/97 (09/27 1500) SpO2:  [85 %-100 %] 96 % (09/27 1500) Weight:  [85.1 kg] 85.1 kg (09/27 0451) Last BM Date : 06/02/23  Weight change: Filed Weights   05/31/23 0422 06/01/23 0500 06/02/23 0451  Weight: 88.1 kg 85.9 kg 85.1 kg    Intake/Output:   Intake/Output Summary (Last 24 hours) at 06/02/2023 1605 Last data filed at 06/02/2023 1500 Gross per 24 hour  Intake 628.17 ml  Output 2400 ml  Net -1771.83 ml    Physical Exam    General:  weak appearing.  No respiratory difficulty HEENT: normal. +[place]ck: supple. JVD to jaw. Carotids 2+ bilat; no bruits. No lymphadenopathy or thyromegaly appreciated. CVC RIJ Cor: PMI nondisplaced. Regular rate & rhythm. No rubs, gallops or murmurs. Lungs: clear, diminished bases Abdomen: soft, nontender, nondistended. No hepatosplenomegaly. No bruits or masses. Good bowel sounds. Extremities: no cyanosis, clubbing, rash, +2-3 BLE edema to thigh GU: +foley Neuro: alert & oriented x 3, cranial nerves grossly intact. moves all 4 extremities w/o difficulty. Affect pleasant.   Telemetry   NSR 70s (Personally reviewed)    EKG    ST low 100s  Labs   Basic Metabolic Panel: Recent Labs  Lab 05/30/23 0542 05/30/23 1220 05/30/23 1733 05/31/23 0400 05/31/23 1653 06/01/23 0452 06/02/23 0529  NA 136 136  --  137 138 139 139  K 3.6 3.5  --  3.3* 3.8 3.5 3.5  CL 86* 90*  --  91* 91* 94* 92*  CO2 30 29  --  31 32 31 30  GLUCOSE 115* 111*  --  119* 104* 81 85  BUN 23* 25*  --  29* 29* 31* 37*  CREATININE 4.31* 4.38*  --  4.39* 4.54* 4.61* 4.29*  CALCIUM 7.9* 8.0*  --  8.0* 7.8* 8.2* 8.4*  MG 2.2  --  2.2 2.0 2.0 2.0  --   PHOS 4.5  --  4.4 4.1 3.5 3.6  --     Liver Function Tests: Recent Labs  Lab 05/29/23 0226 05/29/23 0927 05/30/23 0542 05/31/23 0400 06/01/23 0452 06/02/23 0529  AST 600*  --  211* 151* 126* 89*  ALT 254*  --  203*  185* 154* 128*  ALKPHOS 74  --  74 84 88 87  BILITOT 3.9* 3.6* 2.6* 2.1* 2.3* 2.1*  PROT 5.7*  --  5.5* 5.7* 5.4* 5.2*  ALBUMIN 2.9*  --  2.6* 2.5* 2.4* 2.2*   No results for input(s): "LIPASE", "AMYLASE" in the last 168 hours. Recent Labs  Lab 05/27/23 1514  AMMONIA 34    CBC: Recent Labs  Lab 05/27/23 1728 05/27/23 1917 05/28/23 1053 05/28/23 1648 05/30/23 0542 05/31/23 0400 05/31/23 1653 06/01/23 0452 06/02/23 0529  WBC 5.0   < > 8.6   < > 6.5 6.6 8.0 9.3 10.5  NEUTROABS 3.2  --  6.8  --   --   --   --   --   --   HGB 10.0*   < > 9.8*   < > 9.0* 9.3* 9.4* 9.4* 10.6*  HCT 29.0*   < > 27.9*   < > 26.8* 28.3* 28.8* 28.7* 33.1*  MCV 81.2   < > 81.1   < > 85.6 86.3 86.7 88.6 91.7  PLT 44*  43*   < > 46*   < > 20*  23* 46* 55* 62* 98*   < > = values in this interval not displayed.    Cardiac Enzymes: Recent Labs  Lab 05/29/23 0927  CKTOTAL 1,183*    BNP: BNP (last 3 results) No results for input(s): "BNP" in the last 8760 hours.  ProBNP (last 3 results) No results for input(s): "PROBNP" in the last 8760 hours.   CBG: Recent Labs  Lab 06/02/23 0313 06/02/23 0754 06/02/23 0838 06/02/23 1130 06/02/23 1530  GLUCAP 82 62* 71 82 76    Coagulation Studies: No results for input(s): "LABPROT", "INR" in the last 72 hours.   Imaging   No results found.   Medications:     Current Medications:  arformoterol  15 mcg Nebulization BID   budesonide (PULMICORT) nebulizer solution  0.5 mg Nebulization BID   Chlorhexidine Gluconate Cloth  6 each Topical Daily   feeding supplement  1 Container Oral TID BM   insulin aspart  0-9 Units Subcutaneous Q4H   levETIRAcetam  500 mg Oral BID   OLANZapine zydis  5 mg Oral QHS   mouth rinse  15 mL Mouth Rinse 4 times per day   pantoprazole (PROTONIX) IV  40 mg Intravenous Q12H   potassium chloride  20 mEq Oral Once   sodium chloride flush  10-40 mL Intracatheter Q12H   thiamine  100 mg Oral Daily    Infusions:   sodium chloride Stopped (05/31/23 1438)   sodium chloride Stopped (05/28/23 1959)   furosemide (LASIX) 200 mg in dextrose 5 % 100 mL (2 mg/mL) infusion 15 mg/hr (06/02/23 1500)   heparin 1,700 Units/hr (06/02/23 1500)      Patient Profile   Timothy Hubbard is a 32 y.o. AAM with seizures, mood disorder , HTN and asthma. Admitted with seizures>PEA arrest. AHF team to see for acute systolic heart failure post PEA arrest.   Assessment/Plan  Acute systolic heart failure -Suspect 2/2 PEA arrest -Echo 05/26/2023 EF 60 to 65%.  RV normal.  Trivial MR -Echo 05/28/2023 EF 20 to 25%, LV with GHK, RV severely reduced, mild MR, mod to severe TR -NYHA IV on admission, volume overload -Continue Lasix drip at 15mg /hr. add metolazone 2.5x1. -Co-ox 76%, no indication for inotropic support at this time -Follow Co-ox -CVP unhooked -GDMT  limited by renal function -SGLT2i when Foley removed -May need L/R heart cath.  LHC if renal function stable -May need to see MRI -Place Unna boots -Repeat echo -Not candidate for advanced therapies with substance abuse and noncompliance -Strict I&O, daily weights  PEA arrest - post seizure and intubation  HTN -BP elevated, start hydralazine 25 mg 3 times daily  Seizures -Followed by neurology -Continue Keppra  Acute renal failure  -Suspected ATN postarrest -Baseline SCr around 0.6 -Follow UOP -Nephrology following -Avoid hypotension  Shock liver -Post-arrest -Follow CMET, improving  DVT -On heparin  8.  Substance abuse/ETOH abuse -Cessation advised  9. Coagulopathy, DIC - required MTP - suspected 2/2 shock liver - Oncology following - back on Hepatin gtt, follow CBC - no further bleeding  Length of Stay: 8  Alen Bleacher, NP  06/02/2023, 4:05 PM  Advanced Heart Failure Team Pager [phone] (M-F; 7a - 5p)  Please contact [facility] for night-coverage after hours (4p -7a ) and weekends on [email or web address]   Patient seen with NP, agree with  the above note.    History as noted above.    No prior known cardiac issues.  Patient stopped his anti-seizure meds and presented with a seizure, had a PEA arrest in setting of seizure and was intubated. When he was extubated initially, he had another PEA arrest.  Echo on 9/20 after 1st PEA arrest showed EF 60-65%.  Echo after 2nd PEA arrest on 9/22 showed EF 20-25%.  Course complicated by AKI/ATN, shock liver, and DIC.    Currently, patient stable on 6E.  No complaints.  He is on Lasix gtt for volume overload and heparin gtt for DVT.  Co-ox 76%.   General: NAD Neck: JVP 12 cm, no thyromegaly or thyroid nodule.  Lungs: Clear to auscultation bilaterally with normal respiratory effort. CV: Nondisplaced PMI.  Heart regular S1/S2, no S3/S4, no murmur.  2+ edema to knees.  No carotid bruit.  Normal pedal pulses.  Abdomen: Soft, nontender, no hepatosplenomegaly, no distention.  Skin: Intact without lesions or rashes.  Neurologic: Alert and oriented x 3.  Psych: Normal affect. Extremities: No clubbing or cyanosis.  HEENT: Normal.   Acute drop in EF between 9/20 and 9/22 after his 2nd PEA arrest does suggest a stress cardiomyopathy due to stunning from PEA arrest versus severe medical illness.  Good co-ox today, he is volume overloaded on exam in setting of ATN from shock with creatinine 4.29. Hopefully, renal function will recover with time.  - Continue Lasix 15 mg/hr, will give metolazone 5 mg po x 1 today.  Follow I/Os closely.  - Would not start inotrope with excellent co-ox.  - BP high, add hydralazine/nitrates.  - Agree with repeat limited echo to reassess LV function, may start to see recovery if this was a stress cardiomyopathy.  - Would repeat ECG given diffuse ST depression at time of initial arrest.   PEA arrest was probably not primarily cardiac-driven.  Echo after initial arrest showed EF 60-65%.    Marca Ancona 06/02/2023 5:29 PM

## 2023-06-02 NOTE — Progress Notes (Signed)
Received sign out from Dr. Allena Katz Seizure 9/19, PEA arrest. CPR in [place]. Started on Epi drip. Extubated 9/20 Shock liver with ?DIC  Follow up items: Cardiology consulted today for PEA arrest Acute DVT-heparin, heme/onc following Dr. Myna Hidalgo follow up  Shock Liver-improving, platelets improving New HFrEF Acute renal failure-rhabdo from seizures, ATN shock from kidneys, avoided CRRT, lasix Right pleural effusion-improving, may be able to avoid thoracentesis---requested PCCM follow tomorrow  FMTS to resume care 9/28 0700

## 2023-06-02 NOTE — Progress Notes (Signed)
ANTICOAGULATION CONSULT NOTE  Pharmacy Consult for heparin Indication: DVT  Allergies  Allergen Reactions   Ibuprofen Anaphylaxis    Patient Measurements: Weight: 85.1 kg (187 lb 9.8 oz) Heparin Dosing Weight: 82 kg  Vital Signs: Temp: 98.3 F (36.8 C) (09/27 1947) Temp Source: Oral (09/27 1947) BP: 126/96 (09/27 1947) Pulse Rate: 81 (09/27 1947)  Labs: Recent Labs    05/31/23 1653 06/01/23 0200 06/01/23 0452 06/01/23 1144 06/01/23 2059 06/02/23 0529 06/02/23 1756  HGB 9.4*  --  9.4*  --   --  10.6*  --   HCT 28.8*  --  28.7*  --   --  33.1*  --   PLT 55*  --  62*  --   --  98*  --   HEPARINUNFRC <0.10*   < > 0.16*   < > 0.30 0.21* 0.15*  CREATININE 4.54*  --  4.61*  --   --  4.29*  --    < > = values in this interval not displayed.    Estimated Creatinine Clearance: 25 mL/min (A) (by C-G formula based on SCr of 4.29 mg/dL (H)).   Assessment: 32 yo male who presented with cardiac arrest 2/2 to seizure. Patient now presenting with new DVT. Notable history of abdominopelvic hemorrhage on CT angio (9/21) with massive transfusion. Previously heparin gtt held 2/2 to thrombocytopenia (Plt 20s). Pharmacy consulted restart heparin in the setting of DVT.  Per CCM, okay to anticoagulate using heparin.   Heparin level subtherapeutic at 0.21 on 1600 units/hr. Per discussion with RN, recent blood in foley bag but likely 2/2 pulling on foley catheter. After replacement, no further blood and urine clear. CBC stable and platelets continue to trend up.    Of note, heme/onc wanted to switch pt to lovenox but after discussion with CCM regarding renal dysfunction and recent bleeding, agreed to continue on heparin gtt for now.   Heparin level still subtherapeutic 0.16 despite rate increase, confirmed with RN no issues with heparin infusion or signs of further bleeding.   Goal of Therapy:  Heparin level 0.3-0.5 units/ml Monitor platelets by anticoagulation protocol: Yes   Plan:   Increase heparin infusion at 1850 units/hr Check heparin level in 8 hours and daily while on heparin Continue to monitor H&H and platelets  Thank you for allowing pharmacy to be a part of this patient's care.  Thelma Barge, PharmD Clinical Pharmacist
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
## 2023-06-02 NOTE — Progress Notes (Signed)
Orthopedic Tech Progress Note Patient Details:  Timothy Hubbard 10/03/1990 [id number]  Ortho Devices Type of Ortho Device: Ace wrap, Unna boot Ortho Device/Splint Location: BLE Ortho Device/Splint Interventions: Ordered, Application   Post Interventions Patient Tolerated: Well Instructions Provided: Care of device  [name] 06/02/2023, 10:25 PM

## 2023-06-03 ENCOUNTER — Inpatient Hospital Stay (HOSPITAL_COMMUNITY): Payer: Medicare Other

## 2023-06-03 DIAGNOSIS — K7200 Acute and subacute hepatic failure without coma: Secondary | ICD-10-CM | POA: Insufficient documentation

## 2023-06-03 DIAGNOSIS — I5021 Acute systolic (congestive) heart failure: Secondary | ICD-10-CM | POA: Insufficient documentation

## 2023-06-03 DIAGNOSIS — J90 Pleural effusion, not elsewhere classified: Secondary | ICD-10-CM

## 2023-06-03 DIAGNOSIS — I82451 Acute embolism and thrombosis of right peroneal vein: Secondary | ICD-10-CM | POA: Insufficient documentation

## 2023-06-03 DIAGNOSIS — D689 Coagulation defect, unspecified: Secondary | ICD-10-CM | POA: Insufficient documentation

## 2023-06-03 DIAGNOSIS — K661 Hemoperitoneum: Secondary | ICD-10-CM

## 2023-06-03 DIAGNOSIS — I469 Cardiac arrest, cause unspecified: Secondary | ICD-10-CM | POA: Diagnosis not present

## 2023-06-03 DIAGNOSIS — A419 Sepsis, unspecified organism: Secondary | ICD-10-CM

## 2023-06-03 DIAGNOSIS — N179 Acute kidney failure, unspecified: Secondary | ICD-10-CM | POA: Insufficient documentation

## 2023-06-03 LAB — GLUCOSE, CAPILLARY
Glucose-Capillary: 105 mg/dL — ABNORMAL HIGH (ref 70–99)
Glucose-Capillary: 65 mg/dL — ABNORMAL LOW (ref 70–99)
Glucose-Capillary: 68 mg/dL — ABNORMAL LOW (ref 70–99)
Glucose-Capillary: 71 mg/dL (ref 70–99)
Glucose-Capillary: 77 mg/dL (ref 70–99)
Glucose-Capillary: 85 mg/dL (ref 70–99)
Glucose-Capillary: 89 mg/dL (ref 70–99)
Glucose-Capillary: 89 mg/dL (ref 70–99)
Glucose-Capillary: 95 mg/dL (ref 70–99)

## 2023-06-03 LAB — BASIC METABOLIC PANEL
Anion gap: 15 (ref 5–15)
BUN: 38 mg/dL — ABNORMAL HIGH (ref 6–20)
CO2: 33 mmol/L — ABNORMAL HIGH (ref 22–32)
Calcium: 8.5 mg/dL — ABNORMAL LOW (ref 8.9–10.3)
Chloride: 91 mmol/L — ABNORMAL LOW (ref 98–111)
Creatinine, Ser: 3.73 mg/dL — ABNORMAL HIGH (ref 0.61–1.24)
GFR, Estimated: 21 mL/min — ABNORMAL LOW (ref >60)
Glucose, Bld: 83 mg/dL (ref 70–99)
Potassium: 3.2 mmol/L — ABNORMAL LOW (ref 3.5–5.1)
Sodium: 139 mmol/L (ref 135–145)

## 2023-06-03 LAB — CBC
HCT: 34.3 % — ABNORMAL LOW (ref 39.0–52.0)
Hemoglobin: 11.2 g/dL — ABNORMAL LOW (ref 13.0–17.0)
MCH: 28.7 pg (ref 26.0–34.0)
MCHC: 32.7 g/dL (ref 30.0–36.0)
MCV: 87.9 fL (ref 80.0–100.0)
Platelets: 149 10*3/uL — ABNORMAL LOW (ref 150–400)
RBC: 3.9 MIL/uL — ABNORMAL LOW (ref 4.22–5.81)
RDW: 17.9 % — ABNORMAL HIGH (ref 11.5–15.5)
WBC: 12.3 10*3/uL — ABNORMAL HIGH (ref 4.0–10.5)
nRBC: 0 % (ref 0.0–0.2)

## 2023-06-03 LAB — ECHOCARDIOGRAM COMPLETE
AR max vel: 2.99 cm2
AV Area VTI: 3.26 cm2
AV Area mean vel: 2.93 cm2
AV Mean grad: 3 mm[Hg]
AV Peak grad: 6 mm[Hg]
Ao pk vel: 1.22 m/s
Area-P 1/2: 4.57 cm2
MV VTI: 1.81 cm2
S' Lateral: 3 cm
Weight: 2990.4 [oz_av]

## 2023-06-03 LAB — COMPREHENSIVE METABOLIC PANEL
ALT: 101 U/L — ABNORMAL HIGH (ref 0–44)
AST: 67 U/L — ABNORMAL HIGH (ref 15–41)
Albumin: 2.3 g/dL — ABNORMAL LOW (ref 3.5–5.0)
Alkaline Phosphatase: 82 U/L (ref 38–126)
Anion gap: 13 (ref 5–15)
BUN: 39 mg/dL — ABNORMAL HIGH (ref 6–20)
CO2: 32 mmol/L (ref 22–32)
Calcium: 8.2 mg/dL — ABNORMAL LOW (ref 8.9–10.3)
Chloride: 93 mmol/L — ABNORMAL LOW (ref 98–111)
Creatinine, Ser: 4.02 mg/dL — ABNORMAL HIGH (ref 0.61–1.24)
GFR, Estimated: 19 mL/min — ABNORMAL LOW (ref >60)
Glucose, Bld: 85 mg/dL (ref 70–99)
Potassium: 3.2 mmol/L — ABNORMAL LOW (ref 3.5–5.1)
Sodium: 138 mmol/L (ref 135–145)
Total Bilirubin: 2.2 mg/dL — ABNORMAL HIGH (ref 0.3–1.2)
Total Protein: 5 g/dL — ABNORMAL LOW (ref 6.5–8.1)

## 2023-06-03 LAB — COOXEMETRY PANEL
Carboxyhemoglobin: 3.6 % — ABNORMAL HIGH (ref 0.5–1.5)
Methemoglobin: 0.8 % (ref 0.0–1.5)
O2 Saturation: 84.6 %
Total hemoglobin: 11.7 g/dL — ABNORMAL LOW (ref 12.0–16.0)

## 2023-06-03 LAB — HEPARIN LEVEL (UNFRACTIONATED)
Heparin Unfractionated: 0.23 [IU]/mL — ABNORMAL LOW (ref 0.30–0.70)
Heparin Unfractionated: 0.25 [IU]/mL — ABNORMAL LOW (ref 0.30–0.70)

## 2023-06-03 LAB — MAGNESIUM: Magnesium: 1.6 mg/dL — ABNORMAL LOW (ref 1.7–2.4)

## 2023-06-03 MED ORDER — POTASSIUM CHLORIDE 20 MEQ PO PACK: 60.0000 meq | PACK | Freq: Once | ORAL | Status: DC

## 2023-06-03 MED ORDER — DOXEPIN HCL 10 MG PO CAPS
10.0000 mg | ORAL_CAPSULE | Freq: Every day | ORAL | Status: DC
  Administered 2023-06-03: 10 mg via ORAL
  Filled 2023-06-03: qty 1

## 2023-06-03 MED ORDER — POTASSIUM CHLORIDE 20 MEQ PO PACK
60.0000 meq | PACK | Freq: Once | ORAL | Status: AC
  Administered 2023-06-03: 60 meq via ORAL
  Filled 2023-06-03: qty 3

## 2023-06-03 MED ORDER — MAGNESIUM SULFATE 2 GM/50ML IV SOLN
2.0000 g | Freq: Once | INTRAVENOUS | Status: AC
  Administered 2023-06-03: 2 g via INTRAVENOUS
  Filled 2023-06-03: qty 50

## 2023-06-03 MED ORDER — ACETAMINOPHEN 325 MG PO TABS
650.0000 mg | ORAL_TABLET | Freq: Four times a day (QID) | ORAL | Status: DC | PRN
  Administered 2023-06-06: 650 mg via ORAL
  Filled 2023-06-03: qty 2

## 2023-06-03 MED ORDER — POTASSIUM CHLORIDE 10 MEQ/100ML IV SOLN
10.0000 meq | INTRAVENOUS | Status: AC
  Administered 2023-06-03 – 2023-06-04 (×6): 10 meq via INTRAVENOUS
  Filled 2023-06-03 (×6): qty 100

## 2023-06-03 NOTE — Plan of Care (Signed)
  Problem: Clinical Measurements: Goal: Ability to maintain clinical measurements within normal limits will improve Outcome: Progressing Goal: Will remain free from infection Outcome: Progressing Goal: Respiratory complications will improve Outcome: Progressing Goal: Cardiovascular complication will be avoided Outcome: Progressing   Problem: Safety: Goal: Ability to remain free from injury will improve Outcome: Progressing   Problem: Skin Integrity: Goal: Risk for impaired skin integrity will decrease Outcome: Progressing

## 2023-06-03 NOTE — Assessment & Plan Note (Addendum)
Likely multifactoral including CHF exacerbation, fluid overload, R pleural effusion, hx of asthma, atelectasis 2/2 rib pain from CPR.  - Currently on 2L [place] (RA @baseline ). Wean as tolerated.  - Diurese discussed below - Tylenol prn for rib pain.  -Consider further pain management as needed to prevent atelectasis.  - Tx asthma as below - Incentive spirometry

## 2023-06-03 NOTE — Progress Notes (Signed)
Patient refused laboratory work. Patient is requiring more heparin. No bleeding at the site. Hemoglobin needed for comparison.  Respiratory attempted to give nebulizer treatments. Patient initially agreed. When respiratory noticed there was no mask for his treatment. Patient refused.

## 2023-06-03 NOTE — Assessment & Plan Note (Addendum)
Thought to be ATN 2/2 cardiac arrest. Cr stably elevated ~4 (baseline 0.6). 3.7L UOP yesterday, good response lasix drip.  Was initially planned for dialysis, but dialysis cath now removed since patient kidney function is improving.  - Cont Lasix drip  - Nephrology consulted, appreciate recs - Strict I/O's - Daily weights - Avoid nephrotoxic drugs

## 2023-06-03 NOTE — Assessment & Plan Note (Signed)
-   Cont pulmicort and brovana - Duonebs q4h prn

## 2023-06-03 NOTE — Progress Notes (Addendum)
ANTICOAGULATION CONSULT NOTE  Pharmacy Consult for heparin Indication: DVT  Allergies  Allergen Reactions   Ibuprofen Anaphylaxis    Patient Measurements: Weight: 84.8 kg (186 lb 14.4 oz) Heparin Dosing Weight: 82 kg  Vital Signs: Temp: 98.2 F (36.8 C) (09/28 1316) Temp Source: Oral (09/28 1316) BP: 144/98 (09/28 1408) Pulse Rate: 88 (09/28 0411)  Labs: Recent Labs    06/01/23 0452 06/01/23 1144 06/02/23 0529 06/02/23 1756 06/02/23 2103 06/03/23 0428 06/03/23 0429 06/03/23 1409 06/03/23 1430  HGB 9.4*  --  10.6*  --  10.7* 11.2*  --   --   --   HCT 28.7*  --  33.1*  --  33.0* 34.3*  --   --   --   PLT 62*  --  98*  --   --  149*  --   --   --   HEPARINUNFRC 0.16*   < > 0.21* 0.15*  --   --  0.23*  --  0.25*  CREATININE 4.61*  --  4.29*  --   --  4.02*  --  3.73*  --    < > = values in this interval not displayed.    Estimated Creatinine Clearance: 28.8 mL/min (A) (by C-G formula based on SCr of 3.73 mg/dL (H)).   Assessment: 32 yo male who presented with cardiac arrest 2/2 to seizure. Patient now presenting with new DVT. Notable history of abdominopelvic hemorrhage on CT angio (9/21) with massive transfusion. Previously heparin gtt held 2/2 to thrombocytopenia (Plt 20s). Pharmacy consulted restart heparin in the setting of DVT.  Per CCM, okay to anticoagulate using heparin.   -heparin level= 0.25 on 1950 units/hr  Goal of Therapy:  Heparin level 0.3-0.5 units/ml Monitor platelets by anticoagulation protocol: Yes   Plan:  Increase heparin infusion at 2100 units/hr Heparin level and CBC in am  Harland German, PharmD Clinical Pharmacist **Pharmacist phone directory can now be found on [email or web address] ([email or web address]).  Listed under [facility].

## 2023-06-03 NOTE — Assessment & Plan Note (Signed)
S/p PEA arrest 9/19 and 9/21, now extubated, off pressors and transitioned out of ICU.  - Cardiology consulted, appreciate recs

## 2023-06-03 NOTE — Assessment & Plan Note (Signed)
On Heparin drip (per pharm consult). Per Heme/Onc, will need anticoag for 6 months. Per Heme/Onc, planning to transition to Lovenox BID soon, then oral anticoag at discharge.  - Cont Heparin drip per pharm consult - AM CBC

## 2023-06-03 NOTE — Progress Notes (Signed)
FMTS Brief Progress Note  S:Seen at bedside with Dr. Velna Ochs, nursing present. [name] has had a complicated hospitalization thus far with PEA arrest x3, seizure, newly decreased EF 2/2 arrest, shock liver, DIC, hemoperitoneum, acute right peroneal DVT, AKI, and right pleural effusion.  He is only requesting something to help with sleep tonight. Otherwise no complaints. Tells me he is quite tired from an eventful hospitalization and poor sleep.   O: BP (!) 151/94   Pulse 86   Temp 98.6 F (37 C) (Oral)   Resp 15   Wt 84.8 kg   SpO2 91%   BMI 30.17 kg/m   Gen: NAD, tired appearing but non-toxic HENT: Scleral icterus is present  Cardio: Regular rate and rhythm, no murmur Pulm: Normal WOB on 2L [place], lungs clear anteriorly  Abd: Non-tender, non-distended   A/P: Acute Respiratory Failure Stable on 2L [place]. Has known R pleural effusion. To be seen if this will respond to aggressive diuresis with lasix gtt. Good UOP so far.  - Continue Lasix gtt - Incentive spirometer  - May need thoracentesis if pleural effusion does not respond to diuresis   Acute HFrEF 2/2 Cardiac Arrest AHF team is following. EF 20-25%.  - Remains on lasix gtt - Repeat Co-ox in AM - Working toward GDMT, limited by AKI at present  - Likely to need more fulsome workup prior to discharge, perhaps cath +/- cardiac MRI  AKI Improving. Continues with excellent UOP on lasix. - repeat chemistry in AM - nephro following   DIC  DVT  S/p MTP for DIC. Is on heparin gtt for DVT. No bleeding at present. Previously found to have hemoperiotoneum, but now without abdominal distention or tenderness.  - Monitoring for recurrent bleeding - Repeat CBC in am  Poor Sleep - Will trial doxepin this evening for sleep  - Orders reviewed. Labs for AM ordered, which was adjusted as needed.    Timothy Amel, MD 06/03/2023, 8:30 PM PGY-3, [facility] Night Resident  Please page [phone] with questions.

## 2023-06-03 NOTE — Progress Notes (Signed)
Timothy Hubbard- mother has called this morning to check on patient. States she will becoming later today. She wants to know about his kidney process and his soreness. Notified her that the patient's progress is dependent on him at this time. He will be sore due to he resuscitation process is hard on the body. Patient will be in some pain at this moment because he is healing. Will continue to monitor patient.

## 2023-06-03 NOTE — Assessment & Plan Note (Addendum)
Bcx grew Bifidobacterium. S/p 7 day antibiotics. Remains afebrile. Notably, pt was previously admitted 8/17 w/ oral candidiasis (most likely 2/2 dulera) and HIV was negative a that time. Could consider an underlying immunodeficiency given bacteremia this admission too.  - Antibiotic course: - pip/tazo-9/19-9/23 - cefepime 9/19 - vancomycin 9/19 - unasyn 9/23-9/26 - CTM for signs of infection

## 2023-06-03 NOTE — Progress Notes (Signed)
ANTICOAGULATION CONSULT NOTE  Pharmacy Consult for heparin Indication: DVT  Allergies  Allergen Reactions   Ibuprofen Anaphylaxis    Patient Measurements: Weight: 85.1 kg (187 lb 9.8 oz) Heparin Dosing Weight: 82 kg  Vital Signs: Temp: 98 F (36.7 C) (09/28 0406) Temp Source: Oral (09/28 0406) BP: 138/95 (09/28 0406) Pulse Rate: 88 (09/28 0411)  Labs: Recent Labs    06/01/23 0452 06/01/23 1144 06/02/23 0529 06/02/23 1756 06/02/23 2103 06/03/23 0428 06/03/23 0429  HGB 9.4*  --  10.6*  --  10.7* 11.2*  --   HCT 28.7*  --  33.1*  --  33.0* 34.3*  --   PLT 62*  --  98*  --   --  149*  --   HEPARINUNFRC 0.16*   < > 0.21* 0.15*  --   --  0.23*  CREATININE 4.61*  --  4.29*  --   --  4.02*  --    < > = values in this interval not displayed.    Estimated Creatinine Clearance: 26.7 mL/min (A) (by C-G formula based on SCr of 4.02 mg/dL (H)).   Assessment: 32 yo male who presented with cardiac arrest 2/2 to seizure. Patient now presenting with new DVT. Notable history of abdominopelvic hemorrhage on CT angio (9/21) with massive transfusion. Previously heparin gtt held 2/2 to thrombocytopenia (Plt 20s). Pharmacy consulted restart heparin in the setting of DVT.  Per CCM, okay to anticoagulate using heparin.   Heparin level subtherapeutic at 0.21 on 1600 units/hr. Per discussion with RN, recent blood in foley bag but likely 2/2 pulling on foley catheter. After replacement, no further blood and urine clear. CBC stable and platelets continue to trend up.    Of note, heme/onc wanted to switch pt to lovenox but after discussion with CCM regarding renal dysfunction and recent bleeding, agreed to continue on heparin gtt for now.   Heparin level still subtherapeutic 0.23 on 1850 units/hr, confirmed with RN no issues with heparin infusion or signs of further bleeding. CBC shows low, stable Hgb and plts improvement 98 > 149  Goal of Therapy:  Heparin level 0.3-0.5 units/ml Monitor  platelets by anticoagulation protocol: Yes   Plan:  Increase heparin infusion at 1950 units/hr Check heparin level in 8 hours and daily while on heparin Continue to monitor H&H and platelets  Thank you for allowing pharmacy to be a part of this patient's care.  Arabella Merles, PharmD. Clinical Pharmacist 06/03/2023 5:15 AM

## 2023-06-03 NOTE — Progress Notes (Signed)
Patient has refused to drink the oral potassium and stating its too strong. I notified MD and oral replacement ordered again. He refused to drink what was ordered. Will continue to monitor patient

## 2023-06-03 NOTE — Assessment & Plan Note (Addendum)
New HFrEF (EF 20-25%), likely 2/2 cardiac arrest. Fluid net positive 27L, will need continued diuresis. - Repeat echo to reassess - Cont Lasix drip for fluid overload  - Cont imdur 30mg  daily - Cardiology consulted, appreciate recs

## 2023-06-03 NOTE — Progress Notes (Signed)
Mobility Specialist Progress Note    06/03/23 1526  Mobility  Activity Refused mobility   Pt stated "not right now." Will f/u as schedule permits.    [place] Mobility Specialist  Please Neurosurgeon or Rehab Office at [phone]

## 2023-06-03 NOTE — Progress Notes (Signed)
  Echocardiogram 2D Echocardiogram has been performed.  Timothy Hubbard 06/03/2023, 9:36 AM

## 2023-06-03 NOTE — Assessment & Plan Note (Signed)
Suspect most likely DIC. Less likely TTP. S/p 4 units PRBC, 4 units FFP and 2 units platelets and 1 unit of cryo. Hgb improving to 11.2, plt improving to 149 today. No evidence of active bleed.  - Heme/Onc consulted, appreciate recs - AM CBC

## 2023-06-03 NOTE — Progress Notes (Signed)
Rounding Note    Patient Name: Timothy Hubbard Date of Encounter: 06/03/2023  [facility] Cardiologist: None   Subjective   Patient mildly short of breath but otherwise feeling improved  Inpatient Medications    Scheduled Meds:  arformoterol  15 mcg Nebulization BID   budesonide (PULMICORT) nebulizer solution  0.5 mg Nebulization BID   Chlorhexidine Gluconate Cloth  6 each Topical Daily   feeding supplement  1 Container Oral TID BM   hydrALAZINE  25 mg Oral Q8H   insulin aspart  0-9 Units Subcutaneous Q4H   isosorbide mononitrate  30 mg Oral Daily   levETIRAcetam  500 mg Oral BID   OLANZapine zydis  5 mg Oral QHS   mouth rinse  15 mL Mouth Rinse 4 times per day   pantoprazole (PROTONIX) IV  40 mg Intravenous Q12H   potassium chloride  60 mEq Oral Once   sodium chloride flush  10-40 mL Intracatheter Q12H   thiamine  100 mg Oral Daily   Continuous Infusions:  sodium chloride Stopped (05/31/23 1438)   sodium chloride Stopped (05/28/23 1959)   furosemide (LASIX) 200 mg in dextrose 5 % 100 mL (2 mg/mL) infusion 15 mg/hr (06/02/23 2101)   heparin 1,950 Units/hr (06/03/23 0548)   PRN Meds: sodium chloride, acetaminophen, docusate, ipratropium-albuterol, lip balm, ondansetron (ZOFRAN) IV, mouth rinse, mouth rinse, polyethylene glycol, sodium chloride flush, white petrolatum   Vital Signs    Vitals:   06/03/23 0406 06/03/23 0411 06/03/23 0554 06/03/23 0626  BP: (!) 138/95   130/89  Pulse: 78 88    Resp: 16     Temp: 98 F (36.7 C)     TempSrc: Oral     SpO2: 100% 100%    Weight:   84.8 kg     Intake/Output Summary (Last 24 hours) at 06/03/2023 0936 Last data filed at 06/03/2023 0700 Gross per 24 hour  Intake 1086.43 ml  Output 3700 ml  Net -2613.57 ml      06/03/2023    5:54 AM 06/02/2023    4:51 AM 06/01/2023    5:00 AM  Last 3 Weights  Weight (lbs) 186 lb 14.4 oz 187 lb 9.8 oz 189 lb 6 oz  Weight (kg) 84.777 kg 85.1 kg 85.9 kg      Telemetry     Sinus rhythm- Personally Reviewed  ECG    Sinus rhythm- Personally Reviewed  Physical Exam   GEN: No acute distress.   Neck: No JVD Cardiac: RRR, no murmurs, rubs, or gallops.  Respiratory: Clear to auscultation bilaterally. GI: Soft, nontender, non-distended  MS: No edema; No deformity. Neuro:  Nonfocal  Psych: Normal affect   Labs    High Sensitivity Troponin:   Recent Labs  Lab 05/25/23 1404 05/25/23 1535 05/27/23 1514 05/27/23 1917  TROPONINIHS 35* 54* 210* 240*     Chemistry Recent Labs  Lab 05/31/23 0400 05/31/23 1653 06/01/23 0452 06/02/23 0529 06/03/23 0428  NA 137 138 139 139 138  K 3.3* 3.8 3.5 3.5 3.2*  CL 91* 91* 94* 92* 93*  CO2 31 32 31 30 32  GLUCOSE 119* 104* 81 85 85  BUN 29* 29* 31* 37* 39*  CREATININE 4.39* 4.54* 4.61* 4.29* 4.02*  CALCIUM 8.0* 7.8* 8.2* 8.4* 8.2*  MG 2.0 2.0 2.0  --   --   PROT 5.7*  --  5.4* 5.2* 5.0*  ALBUMIN 2.5*  --  2.4* 2.2* 2.3*  AST 151*  --  126* 89* 67*  ALT 185*  --  154* 128* 101*  ALKPHOS 84  --  88 87 82  BILITOT 2.1*  --  2.3* 2.1* 2.2*  GFRNONAA 17* 17* 16* 18* 19*  ANIONGAP 15 15 14  17* 13    Lipids  Recent Labs  Lab 05/31/23 0400  TRIG 66    Hematology Recent Labs  Lab 06/01/23 0452 06/02/23 0529 06/02/23 2103 06/03/23 0428  WBC 9.3 10.5  --  12.3*  RBC 3.24* 3.61*  --  3.90*  HGB 9.4* 10.6* 10.7* 11.2*  HCT 28.7* 33.1* 33.0* 34.3*  MCV 88.6 91.7  --  87.9  MCH 29.0 29.4  --  28.7  MCHC 32.8 32.0  --  32.7  RDW 17.1* 17.7*  --  17.9*  PLT 62* 98*  --  149*   Thyroid No results for input(s): "TSH", "FREET4" in the last 168 hours.  BNPNo results for input(s): "BNP", "PROBNP" in the last 168 hours.  DDimer  Recent Labs  Lab 05/28/23 0302 05/29/23 0226 05/30/23 0542  DDIMER >20.00* >20.00* >20.00*     Radiology    No results found.  Cardiac Studies   Echo pending  Patient Profile     32 y.o. male presented to the hospital with seizure and PEA arrest.  Was intubated.   Postextubation had a second PEA arrest with an echo showing a reduced ejection fraction of 20 to 25%.  Assessment & Plan    1.  Acute systolic heart failure: Ejection fraction 20 to 25%.  This was after an acute arrest.  Echo today appears to show an improved ejection fraction.  Reduced ejection fraction likely due to arrest.  Significantly volume overloaded.  Agree with continued Lasix.  2.  Seizures: Plan per neurology  3.  PEA arrest: Post seizure and after intubation.  4.  Hypertension: Continue hydralazine  5.  Acute renal failure: Likely due to ATN postarrest.  Creatinine improving.  Continue diuresis.  Nephrology following  6.  Coagulopathy: Likely due to DIC.  Hematology following     For questions or updates, please contact [facility] Please consult [email or web address] for contact info under        Signed, Roddy Bellamy Jorja Loa, MD  06/03/2023, 9:36 AM

## 2023-06-03 NOTE — Assessment & Plan Note (Signed)
-   Cont Hydralazine 25mg  q8h - Cont Lasix drip as above - Holding home amlodipine, lisinopril

## 2023-06-03 NOTE — Progress Notes (Signed)
Hypoglycemic Event  CBG: 68  Treatment: 4 oz juice/soda  Symptoms: None  Follow-up CBG: Time:0133 CBG Result:95  Possible Reasons for Event: Inadequate meal intake  Comments/MD notified: Dr. Barbaraann Faster informed.    Felicity Coyer, RN 06/03/23 [phone]

## 2023-06-03 NOTE — Evaluation (Signed)
Occupational Therapy Evaluation Patient Details Name: Timothy Hubbard MRN: [id number] DOB: 1990/11/03 Today's Date: 06/03/2023   History of Present Illness 32 y.o. male admitted 9/19 for seizures complicated by PEA cardiac arrest x 2, approximately 3 minutes total downtime. Pt with shock of mixed etiology, cardiogenic and septic due to aspiration PNA. Intubation 9/19-9/20, 9/21 3rd brief PEA arrest and re intubated 9/21-9/26. Course complicated by oliguric AKI, hemoperitoneum and acute anemia in setting of DIC, DVT in RLE. PMhx: seizures, HTN, asthma, mood disorder, substance abuse   Clinical Impression   At baseline, pt receives assist for ADLs and IADLs and performs functional mobility with a RW household distances with Contact guard assist. Pt now presents with decreased activity tolerance, generalized B UE weakness, decreased balance during functional tasks, decreased cognition, and decreased safety and independence with functional tasks. Pt currently demonstrates ability to complete UB ADLs with Mod I to Mod assist, LB ADLs with Max assist of +1 to +2, and Min assist +2 for functional transfers with a RW. Pt will benefit from acute skilled OT services to address deficits outlined below, decrease caregiver burden, and increase safety and independence with functional tasks. Post acute discharge, pt will benefit from intensive inpatient skilled rehab services < 3 hours per day to maximize rehab potential.       If plan is discharge home, recommend the following: Two people to help with walking and/or transfers;A lot of help with bathing/dressing/bathroom;Assistance with cooking/housework;Assist for transportation;Help with stairs or ramp for entrance    Functional Status Assessment  Patient has had a recent decline in their functional status and demonstrates the ability to make significant improvements in function in a reasonable and predictable amount of time.  Equipment Recommendations  Other  (comment) (defer to next level of care)    Recommendations for Other Services       Precautions / Restrictions Precautions Precautions: Fall;Other (comment) Precaution Comments: incontinent bm Restrictions Weight Bearing Restrictions: No      Mobility Bed Mobility Overal bed mobility: Needs Assistance Bed Mobility: Supine to Sit, Sit to Supine     Supine to sit: Min assist, HOB elevated, Used rails Sit to supine: Min assist, HOB elevated, Used rails   General bed mobility comments: with cues for technique and use of rails    Transfers Overall transfer level: Needs assistance Equipment used: Rolling walker (2 wheels) Transfers: Sit to/from Stand Sit to Stand: Min assist                  Balance Overall balance assessment: Needs assistance Sitting-balance support: No upper extremity supported, Feet supported Sitting balance-Leahy Scale: Fair     Standing balance support: Bilateral upper extremity supported, During functional activity, Reliant on assistive device for balance Standing balance-Leahy Scale: Poor Standing balance comment: RW in standing                           ADL either performed or assessed with clinical judgement   ADL Overall ADL's : Needs assistance/impaired Eating/Feeding: Modified independent;Bed level   Grooming: Set up;Bed level   Upper Body Bathing: Moderate assistance;Bed level;Cueing for compensatory techniques   Lower Body Bathing: Maximal assistance;Cueing for compensatory techniques;Bed level   Upper Body Dressing : Minimal assistance;Sitting   Lower Body Dressing: Maximal assistance;Sit to/from stand;Sitting/lateral leans   Toilet Transfer: Minimal assistance;+2 for safety/equipment;BSC/3in1;Rolling walker (2 wheels)   Toileting- Clothing Manipulation and Hygiene: Maximal assistance;Sit to/from stand  General ADL Comments: Pt with decreased activity tolerance.     Vision Baseline Vision/History: 1  Wears glasses (reports he has glasses but doesn't usually wear them) Ability to See in Adequate Light: 0 Adequate Patient Visual Report: No change from baseline       Perception         Praxis         Pertinent Vitals/Pain Pain Assessment Pain Assessment: Faces Faces Pain Scale: Hurts little more Pain Location: back Pain Descriptors / Indicators: Aching, Discomfort, Grimacing, Guarding Pain Intervention(s): Limited activity within patient's tolerance, Monitored during session, Repositioned     Extremity/Trunk Assessment Upper Extremity Assessment Upper Extremity Assessment: Right hand dominant;Generalized weakness;LUE deficits/detail;RUE deficits/detail   Lower Extremity Assessment Lower Extremity Assessment: Defer to PT evaluation   Cervical / Trunk Assessment Cervical / Trunk Assessment: Normal   Communication Communication Communication: No apparent difficulties (soft spoken)   Cognition Arousal: Alert Behavior During Therapy: Flat affect Overall Cognitive Status: Impaired/Different from baseline Area of Impairment: Orientation, Attention, Memory, Following commands, Safety/judgement, Awareness                 Orientation Level: Disoriented to, Time Current Attention Level: Sustained Memory: Decreased short-term memory Following Commands: Follows one step commands consistently Safety/Judgement: Decreased awareness of deficits, Decreased awareness of safety Awareness: Emergent         General Comments  VSS on RA throughout session.    Exercises     Shoulder Instructions      Home Living Family/patient expects to be discharged to:: Private residence Living Arrangements: Parent Available Help at Discharge: Family;Available PRN/intermittently;Personal care attendant (mother) Type of Home: House Home Access: Stairs to enter [employer] of Steps: 4 Entrance Stairs-Rails: Can reach both Home Layout: One level     Bathroom Shower/Tub:  Chief Strategy Officer: Standard     Home Equipment: Agricultural consultant (2 wheels);BSC/3in1;Shower seat          Prior Functioning/Environment Prior Level of Function : Needs assist             Mobility Comments: pt reports PCA helping with gait with belt and RW PTA ADLs Comments: pt states PCA does IADL and assists with bathing, dressing, and toileting        OT Problem List: Decreased strength;Decreased activity tolerance;Impaired balance (sitting and/or standing);Decreased safety awareness;Decreased knowledge of use of DME or AE;Pain      OT Treatment/Interventions: Self-care/ADL training;Therapeutic exercise;DME and/or AE instruction;Therapeutic activities;Patient/family education;Balance training    OT Goals(Current goals can be found in the care plan section) Acute Rehab OT Goals Patient Stated Goal: To get stronger and be more independent OT Goal Formulation: With patient Time For Goal Achievement: 06/17/23 Potential to Achieve Goals: Good ADL Goals Pt Will Perform Lower Body Bathing: with min assist;sitting/lateral leans;sit to/from stand Pt Will Perform Upper Body Dressing: with set-up;sitting Pt Will Perform Lower Body Dressing: with min assist;sitting/lateral leans;sit to/from stand Pt Will Transfer to Toilet: with contact guard assist;ambulating;regular height toilet;grab bars (with least restrictive AD) Pt Will Perform Toileting - Clothing Manipulation and hygiene: sitting/lateral leans;sit to/from stand;with min assist Pt/caregiver will Perform Home Exercise Program: Increased strength;Both right and left upper extremity;With theraband;With theraputty;With Supervision;With written HEP provided  OT Frequency: Min 1X/week    Co-evaluation              AM-PAC OT "6 Clicks" Daily Activity     Outcome Measure Help from another person eating meals?: None Help from another person taking  care of personal grooming?: A Little Help from another person  toileting, which includes using toliet, bedpan, or urinal?: A Lot Help from another person bathing (including washing, rinsing, drying)?: A Lot Help from another person to put on and taking off regular upper body clothing?: A Little Help from another person to put on and taking off regular lower body clothing?: A Lot 6 Click Score: 16   End of Session Equipment Utilized During Treatment: Gait belt;Rolling walker (2 wheels) Nurse Communication: Mobility status  Activity Tolerance: Patient tolerated treatment well;Patient limited by fatigue Patient left: in bed;with call bell/phone within reach;with bed alarm set  OT Visit Diagnosis: Unsteadiness on feet (R26.81);Muscle weakness (generalized) (M62.81);Other (comment) (Decreased activity tolerance)                Time: 1610-9604 OT Time Calculation (min): 16 min Charges:  OT General Charges $OT Visit: 1 Visit OT Evaluation $OT Eval Moderate Complexity: 1 Mod  [address] OTR/L, MA Acute Rehab [phone]   Lendon Colonel 06/03/2023, 5:28 PM

## 2023-06-03 NOTE — Assessment & Plan Note (Signed)
-   Cont Keppra 500mg  BID - Neurology consulted, appreciate recs.

## 2023-06-03 NOTE — Progress Notes (Cosign Needed Addendum)
Daily Progress Note Intern Pager: [phone]  Patient name: Timothy Hubbard Medical record number: [id number] Date of birth: [birth year] Age: 32 y.o. Gender: male  Primary Care Provider: Vonna Drafts, MD Consultants: Cardiology, Pulmonology, Heme/Onc, Nephrology, Neurology, Gen Surg (signed off) Code Status: Full  Pt Overview and Major Events to Date:  9/19 2 PEA arrests ROSC achieved, admit to ICU; seizure and cardiac arrest 9/20 extubated 05/27/2023: PEA arrest and after woke up and was confused.  Total of 2 rounds of CPR with epi and bicarb x 1.  But became bradycardic > intubated.  Had A-line placed.  Also central line ascites tap did show blood.  Was given 4 units PRBC, 4 units FFP and 2 units platelets and 1 unit of cryo major transfusion protocol Gen Surg consult for hemoperitoneum.  DIC panel positive.  He had some oozing from mouth and IV sites EEG showed symmetric low voltage no/seizure activity 06/01/2023: Extubated 06/03/2023: Transferred to FMTS  Assessment and Plan: EA is a 32yo M w/ hx of seizure disorder, alcohol use disorder, HTN, mood disorder that admitted s/p cardiac arrest. Unclear etiology for cardiac arrest, but leading differential includes seizure (2/2 subtherapeutic home valproic acid) vs sepsis (Bcx grew Bifidobacterium). Assessment & Plan Cardiac arrest [facility]) S/p PEA arrest 9/19 and 9/21, now extubated, off pressors and transitioned out of ICU.  - Cardiology consulted, appreciate recs Acute respiratory failure with hypercapnia (HCC) Likely multifactoral including CHF exacerbation, fluid overload, R pleural effusion, hx of asthma, atelectasis 2/2 rib pain from CPR.  - Currently on 2L [place] (RA @baseline ). Wean as tolerated.  - Diurese discussed below - Tylenol prn for rib pain.  -Consider further pain management as needed to prevent atelectasis.  - Tx asthma as below - Incentive spirometry  Acute deep vein thrombosis (DVT) of right peroneal vein (HCC) On  Heparin drip (per pharm consult). Per Heme/Onc, will need anticoag for 6 months. Per Heme/Onc, planning to transition to Lovenox BID soon, then oral anticoag at discharge.  - Cont Heparin drip per pharm consult - AM CBC AKI (acute kidney injury) (HCC) Thought to be ATN 2/2 cardiac arrest. Cr stably elevated ~4 (baseline 0.6). 3.7L UOP yesterday, good response lasix drip.  Was initially planned for dialysis, but dialysis cath now removed since patient kidney function is improving.  - Cont Lasix drip  - Nephrology consulted, appreciate recs - Strict I/O's - Daily weights - Avoid nephrotoxic drugs Acute HFrEF (heart failure with reduced ejection fraction) (HCC) New HFrEF (EF 20-25%), likely 2/2 cardiac arrest. Fluid net positive 27L, will need continued diuresis. - Repeat echo to reassess - Cont Lasix drip for fluid overload  - Cont imdur 30mg  daily - Cardiology consulted, appreciate recs Sepsis (HCC) (Resolved: 06/03/2023) Bcx grew Bifidobacterium. S/p 7 day antibiotics. Remains afebrile. Notably, pt was previously admitted 8/17 w/ oral candidiasis (most likely 2/2 dulera) and HIV was negative a that time. Could consider an underlying immunodeficiency given bacteremia this admission too.  - Antibiotic course: - pip/tazo-9/19-9/23 - cefepime 9/19 - vancomycin 9/19 - unasyn 9/23-9/26 - CTM for signs of infection Seizure disorder (HCC) - Cont Keppra 500mg  BID - Neurology consulted, appreciate recs.  Hypertension - Cont Hydralazine 25mg  q8h - Cont Lasix drip as above - Holding home amlodipine, lisinopril  Asthma - Cont pulmicort and brovana - Duonebs q4h prn Pleural effusion, right Likely 2/2 fluid overload. - Lasix drip as above Coagulopathy (HCC) Suspect most likely DIC. Less likely TTP. S/p 4 units PRBC, 4 units  FFP and 2 units platelets and 1 unit of cryo. Hgb improving to 11.2, plt improving to 149 today. No evidence of active bleed.  - Heme/Onc consulted, appreciate recs - AM  CBC Hemoperitoneum (Resolved: 06/03/2023) Thought to be 2/2 DIC. Evaled by Gen Surg, did not think surgical intervention was indicated. Abm exam benign today. Shock liver 2/2 cardiac arrest. Transaminases steadily downtrending. Abm exam benign.  - monitor daily CMP   FEN/GI: Dysphagia 3. Boost feeding supplement TID. K repleted.  PPx: Heparin drip as above Dispo:Pending PT recommendations  pending clinical improvement . Barriers include need for IV lasix and heparin.   Subjective:  Reports some rib pain today. Pt unsure what could have caused his current presentation, does not recall any new events prior to arrest.   Objective: Temp:  [97.9 F (36.6 C)-98.4 F (36.9 C)] 98 F (36.7 C) (09/28 0406) Pulse Rate:  [69-144] 88 (09/28 0411) Resp:  [10-42] 16 (09/28 0406) BP: (126-158)/(89-120) 130/89 (09/28 0626) SpO2:  [85 %-100 %] 100 % (09/28 0411) Weight:  [84.8 kg] 84.8 kg (09/28 0554) Physical Exam: General: Alert, pleasant man speaking in full sentence and responding appropriately(slightly slurred/mumbling speech difficult to understand), laying comfortably in bed. NAD. Generlaized weakness, required assistance to sit up for lung exam.  Cardiovascular: RRR. No murmurs. Radial pulses 2+  Respiratory: Mild end-expiratory wheezing in LUL. On 2L [place].  Abdomen: Soft, nontender, nondistended. Normal BS. Urine in foley bag appear pale yellow.  Extremities: LE wrapped in unna boots BL.   Laboratory: Most recent CBC Lab Results  Component Value Date   WBC 12.3 (H) 06/03/2023   HGB 11.2 (L) 06/03/2023   HCT 34.3 (L) 06/03/2023   MCV 87.9 06/03/2023   PLT 149 (L) 06/03/2023   Most recent BMP    Latest Ref Rng & Units 06/03/2023    4:28 AM  BMP  Glucose 70 - 99 mg/dL 85   BUN 6 - 20 mg/dL 39   Creatinine 2.72 - 1.24 mg/dL 5.36   Sodium 644 - 034 mmol/L 138   Potassium 3.5 - 5.1 mmol/L 3.2   Chloride 98 - 111 mmol/L 93   CO2 22 - 32 mmol/L 32   Calcium 8.9 - 10.3 mg/dL 8.2     Lincoln Brigham, MD 06/03/2023, 10:33 AM  PGY-2, [facility] FPTS Intern pager: [phone], text pages welcome Secure chat group [facility] Teaching Service

## 2023-06-03 NOTE — Assessment & Plan Note (Signed)
2/2 cardiac arrest. Transaminases steadily downtrending. Abm exam benign.  - monitor daily CMP
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
## 2023-06-03 NOTE — Progress Notes (Signed)
Nephrology Follow-Up Consult note   Assessment/Recommendations: Timothy Hubbard is a/an 32 y.o. male with a past medical history significant for seizures, htn, and asthma, admitted for seizures c/b cardiac arrest, severe AKI, CHF, bacteremia, coagulopathy, hemoperitoneum, RLE VTE.       AKI, recovering: Likely secondary to ATN associated w/ cardiac arrest and shock.  GN workup was negative.  Now improving -Creatinine improving steadily, excellent urine output with diuretics -Continue with Lasix infusion, appreciate help from heart failure team -Removed dialysis catheter -Continue to monitor daily Cr, Dose meds for GFR -Monitor Daily I/Os, Daily weight  -Maintain MAP>65 for optimal renal perfusion.  -Avoid nephrotoxic medications including NSAIDs -Use synthetic opioids (Fentanyl/Dilaudid) if needed -Currently no indication for HD  HFrEF: In the setting of recent arrest.  Heart failure team following.  Volume overloaded and undergoing diuresis with lasix infusion.  Responding well.  Sepsis/bacteremia: Completed antibiotic course.  Coagulopathy: Hematology has seen. Mgmt per primary  Seizures: Management per neurology  Hypertension: On hydralazine   Recommendations conveyed to primary service.    Darnell Level  [facility] 06/03/2023 10:32 AM  ___________________________________________________________  CC: Seizures  Interval History/Subjective: Patient feels well today with no complaints.  Good diuresis yesterday.  Creatinine slowly improving   Medications:  Current Facility-Administered Medications  Medication Dose Route Frequency Provider Last Rate Last Admin   0.9 %  sodium chloride infusion  250 mL Intravenous Continuous Cheri Fowler, MD   Stopped at 05/31/23 1438   0.9 %  sodium chloride infusion   Intravenous PRN Cheri Fowler, MD   Stopped at 05/28/23 1959   acetaminophen (TYLENOL) tablet 650 mg  650 mg Oral Q6H PRN Lincoln Brigham, MD        arformoterol [facility]) nebulizer solution 15 mcg  15 mcg Nebulization BID Lorin Glass, MD   15 mcg at 06/02/23 0751   budesonide (PULMICORT) nebulizer solution 0.5 mg  0.5 mg Nebulization BID Lidia Collum, PA-C   0.5 mg at 06/02/23 4540   Chlorhexidine Gluconate Cloth 2 % PADS 6 each  6 each Topical Daily Cheri Fowler, MD   6 each at 06/01/23 9811   docusate (COLACE) 50 MG/5ML liquid 100 mg  100 mg Oral BID PRN Lynnell Catalan, MD       feeding supplement (BOOST / RESOURCE BREEZE) liquid 1 Container  1 Container Oral TID BM Mannam, Praveen, MD       furosemide (LASIX) 200 mg in dextrose 5 % 100 mL (2 mg/mL) infusion  15 mg/hr Intravenous Continuous Darnell Level, MD 7.5 mL/hr at 06/02/23 2101 15 mg/hr at 06/02/23 2101   heparin ADULT infusion 100 units/mL (25000 units/27mL)  1,950 Units/hr Intravenous Continuous Arabella Merles, RPH 19.5 mL/hr at 06/03/23 0548 1,950 Units/hr at 06/03/23 0548   hydrALAZINE (APRESOLINE) tablet 25 mg  25 mg Oral Q8H Laurey Morale, MD   25 mg at 06/03/23 0626   insulin aspart (novoLOG) injection 0-9 Units  0-9 Units Subcutaneous Q4H Bell, Lorin C, RPH   1 Units at 05/29/23 0316   ipratropium-albuterol (DUONEB) 0.5-2.5 (3) MG/3ML nebulizer solution 3 mL  3 mL Nebulization Q4H PRN Pia Mau D, PA-C   3 mL at 06/01/23 2031   isosorbide mononitrate (IMDUR) 24 hr tablet 30 mg  30 mg Oral Daily Laurey Morale, MD   30 mg at 06/02/23 1737   levETIRAcetam (KEPPRA) tablet 500 mg  500 mg Oral BID Lynnell Catalan, MD   500 mg at 06/02/23 2228  lip balm (CARMEX) ointment   Topical PRN Lynnell Catalan, MD   1 Application at 06/01/23 1142   OLANZapine zydis (ZYPREXA) disintegrating tablet 5 mg  5 mg Oral QHS Modena Slater, DO   5 mg at 06/02/23 2228   ondansetron (ZOFRAN) injection 4 mg  4 mg Intravenous Q6H PRN Cheri Fowler, MD       Oral care mouth rinse  15 mL Mouth Rinse PRN Lorin Glass, MD       Oral care mouth rinse  15 mL Mouth Rinse 4 times per day Lynnell Catalan, MD   15 mL at 06/02/23 2229   Oral care mouth rinse  15 mL Mouth Rinse PRN Lynnell Catalan, MD       pantoprazole (PROTONIX) injection 40 mg  40 mg Intravenous Q12H Champ Mungo, DO   40 mg at 06/02/23 2229   polyethylene glycol (MIRALAX / GLYCOLAX) packet 17 g  17 g Oral Daily PRN Lynnell Catalan, MD       potassium chloride (KLOR-CON) packet 60 mEq  60 mEq Oral Once Lincoln Brigham, MD       sodium chloride flush (NS) 0.9 % injection 10-40 mL  10-40 mL Intracatheter Q12H Lorin Glass, MD   10 mL at 06/02/23 6578   sodium chloride flush (NS) 0.9 % injection 10-40 mL  10-40 mL Intracatheter PRN Lorin Glass, MD       thiamine (VITAMIN B1) tablet 100 mg  100 mg Oral Daily Agarwala, Daleen Bo, MD   100 mg at 06/02/23 0908   white petrolatum (VASELINE) gel   Topical PRN Cheri Fowler, MD          Review of Systems: 10 systems reviewed and negative except per interval history/subjective  Physical Exam: Vitals:   06/03/23 0411 06/03/23 0626  BP:  130/89  Pulse: 88   Resp:    Temp:    SpO2: 100%    No intake/output data recorded.  Intake/Output Summary (Last 24 hours) at 06/03/2023 1032 Last data filed at 06/03/2023 0700 Gross per 24 hour  Intake 1086.43 ml  Output 3150 ml  Net -2063.57 ml   Constitutional: well-appearing, no acute distress ENMT: ears and nose without scars or lesions, MMM CV: normal rate, 2+ edema of the bilateral lower extremities Respiratory: Bilateral chest rise, normal work of breathing Gastrointestinal: soft, non-tender, no palpable masses or hernias Psych: alert, judgement/insight appropriate, appropriate mood and affect   Test Results I personally reviewed new and old clinical labs and radiology tests Lab Results  Component Value Date   NA 138 06/03/2023   K 3.2 (L) 06/03/2023   CL 93 (L) 06/03/2023   CO2 32 06/03/2023   BUN 39 (H) 06/03/2023   CREATININE 4.02 (H) 06/03/2023   CALCIUM 8.2 (L) 06/03/2023   ALBUMIN 2.3 (L) 06/03/2023   PHOS 3.6  06/01/2023    CBC Recent Labs  Lab 05/27/23 1728 05/27/23 1917 05/28/23 1053 05/28/23 1648 06/01/23 0452 06/02/23 0529 06/02/23 2103 06/03/23 0428  WBC 5.0   < > 8.6   < > 9.3 10.5  --  12.3*  NEUTROABS 3.2  --  6.8  --   --   --   --   --   HGB 10.0*   < > 9.8*   < > 9.4* 10.6* 10.7* 11.2*  HCT 29.0*   < > 27.9*   < > 28.7* 33.1* 33.0* 34.3*  MCV 81.2   < > 81.1   < > 88.6 91.7  --  87.9  PLT 44*  43*   < > 46*   < > 62* 98*  --  149*   < > = values in this interval not displayed.

## 2023-06-03 NOTE — Assessment & Plan Note (Signed)
Likely 2/2 fluid overload. - Lasix drip as above

## 2023-06-03 NOTE — Assessment & Plan Note (Signed)
Thought to be 2/2 DIC. Evaled by Gen Surg, did not think surgical intervention was indicated. Abm exam benign today.

## 2023-06-04 DIAGNOSIS — I469 Cardiac arrest, cause unspecified: Secondary | ICD-10-CM | POA: Diagnosis not present

## 2023-06-04 LAB — COOXEMETRY PANEL
Carboxyhemoglobin: 3.7 % — ABNORMAL HIGH (ref 0.5–1.5)
Methemoglobin: 1.3 % (ref 0.0–1.5)
O2 Saturation: 86.7 %
Total hemoglobin: 11.9 g/dL — ABNORMAL LOW (ref 12.0–16.0)

## 2023-06-04 LAB — MAGNESIUM: Magnesium: 1.8 mg/dL (ref 1.7–2.4)

## 2023-06-04 LAB — GLUCOSE, CAPILLARY
Glucose-Capillary: 72 mg/dL (ref 70–99)
Glucose-Capillary: 75 mg/dL (ref 70–99)
Glucose-Capillary: 77 mg/dL (ref 70–99)
Glucose-Capillary: 80 mg/dL (ref 70–99)
Glucose-Capillary: 85 mg/dL (ref 70–99)
Glucose-Capillary: 87 mg/dL (ref 70–99)

## 2023-06-04 LAB — CBC WITH DIFFERENTIAL/PLATELET
Abs Immature Granulocytes: 0.24 10*3/uL — ABNORMAL HIGH (ref 0.00–0.07)
Basophils Absolute: 0 10*3/uL (ref 0.0–0.1)
Basophils Relative: 0 %
Eosinophils Absolute: 0 10*3/uL (ref 0.0–0.5)
Eosinophils Relative: 0 %
HCT: 35.4 % — ABNORMAL LOW (ref 39.0–52.0)
Hemoglobin: 11.6 g/dL — ABNORMAL LOW (ref 13.0–17.0)
Immature Granulocytes: 2 %
Lymphocytes Relative: 20 %
Lymphs Abs: 2.5 10*3/uL (ref 0.7–4.0)
MCH: 28.5 pg (ref 26.0–34.0)
MCHC: 32.8 g/dL (ref 30.0–36.0)
MCV: 87 fL (ref 80.0–100.0)
Monocytes Absolute: 2 10*3/uL — ABNORMAL HIGH (ref 0.1–1.0)
Monocytes Relative: 16 %
Neutro Abs: 7.5 10*3/uL (ref 1.7–7.7)
Neutrophils Relative %: 62 %
Platelets: 195 10*3/uL (ref 150–400)
RBC: 4.07 MIL/uL — ABNORMAL LOW (ref 4.22–5.81)
RDW: 18.1 % — ABNORMAL HIGH (ref 11.5–15.5)
WBC: 12.2 10*3/uL — ABNORMAL HIGH (ref 4.0–10.5)
nRBC: 0.2 % (ref 0.0–0.2)

## 2023-06-04 LAB — COMPREHENSIVE METABOLIC PANEL
ALT: 81 U/L — ABNORMAL HIGH (ref 0–44)
AST: 49 U/L — ABNORMAL HIGH (ref 15–41)
Albumin: 2.3 g/dL — ABNORMAL LOW (ref 3.5–5.0)
Alkaline Phosphatase: 68 U/L (ref 38–126)
Anion gap: 14 (ref 5–15)
BUN: 37 mg/dL — ABNORMAL HIGH (ref 6–20)
CO2: 32 mmol/L (ref 22–32)
Calcium: 8.4 mg/dL — ABNORMAL LOW (ref 8.9–10.3)
Chloride: 93 mmol/L — ABNORMAL LOW (ref 98–111)
Creatinine, Ser: 3.48 mg/dL — ABNORMAL HIGH (ref 0.61–1.24)
GFR, Estimated: 23 mL/min — ABNORMAL LOW (ref >60)
Glucose, Bld: 83 mg/dL (ref 70–99)
Potassium: 3.2 mmol/L — ABNORMAL LOW (ref 3.5–5.1)
Sodium: 139 mmol/L (ref 135–145)
Total Bilirubin: 1.7 mg/dL — ABNORMAL HIGH (ref 0.3–1.2)
Total Protein: 5 g/dL — ABNORMAL LOW (ref 6.5–8.1)

## 2023-06-04 LAB — CBC
HCT: 34.6 % — ABNORMAL LOW (ref 39.0–52.0)
HCT: 35.7 % — ABNORMAL LOW (ref 39.0–52.0)
Hemoglobin: 11.3 g/dL — ABNORMAL LOW (ref 13.0–17.0)
Hemoglobin: 11.7 g/dL — ABNORMAL LOW (ref 13.0–17.0)
MCH: 28.4 pg (ref 26.0–34.0)
MCH: 28.3 pg (ref 26.0–34.0)
MCHC: 32.7 g/dL (ref 30.0–36.0)
MCHC: 32.8 g/dL (ref 30.0–36.0)
MCV: 86.9 fL (ref 80.0–100.0)
MCV: 86.2 fL (ref 80.0–100.0)
Platelets: 188 10*3/uL (ref 150–400)
Platelets: 192 10*3/uL (ref 150–400)
RBC: 3.98 MIL/uL — ABNORMAL LOW (ref 4.22–5.81)
RBC: 4.14 MIL/uL — ABNORMAL LOW (ref 4.22–5.81)
RDW: 18.3 % — ABNORMAL HIGH (ref 11.5–15.5)
RDW: 18.6 % — ABNORMAL HIGH (ref 11.5–15.5)
WBC: 12.4 10*3/uL — ABNORMAL HIGH (ref 4.0–10.5)
WBC: 12.1 10*3/uL — ABNORMAL HIGH (ref 4.0–10.5)
nRBC: 0.2 % (ref 0.0–0.2)
nRBC: 0 % (ref 0.0–0.2)

## 2023-06-04 LAB — HEPARIN LEVEL (UNFRACTIONATED)
Heparin Unfractionated: 0.19 [IU]/mL — ABNORMAL LOW (ref 0.30–0.70)
Heparin Unfractionated: 0.42 [IU]/mL (ref 0.30–0.70)

## 2023-06-04 MED ORDER — FUROSEMIDE 10 MG/ML IJ SOLN
80.0000 mg | Freq: Two times a day (BID) | INTRAMUSCULAR | Status: DC
  Administered 2023-06-04 – 2023-06-07 (×7): 80 mg via INTRAVENOUS
  Filled 2023-06-04 (×7): qty 8

## 2023-06-04 MED ORDER — FOLIC ACID 1 MG PO TABS
1.0000 mg | ORAL_TABLET | Freq: Every day | ORAL | Status: DC
  Administered 2023-06-05 – 2023-06-11 (×7): 1 mg via ORAL
  Filled 2023-06-04 (×8): qty 1

## 2023-06-04 MED ORDER — POTASSIUM CHLORIDE CRYS ER 20 MEQ PO TBCR: 40.0000 meq | EXTENDED_RELEASE_TABLET | Freq: Once | ORAL | Status: DC

## 2023-06-04 MED ORDER — DOXEPIN HCL 10 MG PO CAPS: 10.0000 mg | ORAL_CAPSULE | Freq: Every evening | ORAL | Status: DC | PRN

## 2023-06-04 MED ORDER — POTASSIUM CHLORIDE CRYS ER 20 MEQ PO TBCR
30.0000 meq | EXTENDED_RELEASE_TABLET | ORAL | Status: AC
  Administered 2023-06-04 (×3): 30 meq via ORAL
  Filled 2023-06-04 (×3): qty 1

## 2023-06-04 NOTE — Progress Notes (Addendum)
Patient education done for the patient. He has pulled up the dressing. Notified him that he cannot touch it and doesnt need to make sure anything is okay himself. If he had any concerns he should contact his nurse or the care team. Patient stated he understood. Will continue to monitor.   At 1900, notified [facility] of patient's site has a constant ooze. While dressing change and notified of increasing need of dressing change they should reach out and notified MD. Will continue to monitor patient

## 2023-06-04 NOTE — Assessment & Plan Note (Signed)
Likely multifactoral including CHF exacerbation, fluid overload, R pleural effusion, hx of asthma, atelectasis 2/2 rib pain from CPR.  Baseline mentation. - Currently on 2L [place] (RA @baseline ). Wean as tolerated.  - Diurese discussed below - Tylenol prn for rib pain.  -Consider further pain management as needed to prevent atelectasis.  - Tx asthma as below - Incentive spirometry + pulm toilet as indicated - PT OT for deconditioning

## 2023-06-04 NOTE — Assessment & Plan Note (Signed)
-   Cont pulmicort and brovana - Duonebs q4h prn

## 2023-06-04 NOTE — Plan of Care (Signed)
Patient is progressing in the right direction. He is wanting more autonomy with getting up and being successful. He needs help from time to time and has yet to eat a full meal. Today he has been very tired an sleeping. No acute changes noted in vital signs and patient has a BM today

## 2023-06-04 NOTE — Progress Notes (Signed)
Received message from RN that patient was bleeding from his IJ site.  Immediately evaluated patient with Dr. Jena Gauss.  Bleeding appears limited to bandage site, no signs of active bleeding.  No bleeding from any other sites.  Stat CBC with differential ordered.  Heparin drip paused for now.  Will continue to monitor.

## 2023-06-04 NOTE — Progress Notes (Signed)
Daily Progress Note Intern Pager: [phone]  Patient name: Timothy Hubbard Medical record number: [id number] Date of birth: [birth year] Age: 32 y.o. Gender: male  Primary Care Provider: Vonna Drafts, MD Cardiology, Pulmonology, Heme/Onc, Nephrology, Neurology, Gen Surg (signed off) Code Status: Full  Pt Overview and Major Events to Date:  9/19 2 PEA arrests ROSC achieved, admit to ICU; seizure and cardiac arrest 9/20 extubated 05/27/2023: PEA arrest and after woke up and was confused.  Total of 2 rounds of CPR with epi and bicarb x 1.  But became bradycardic > intubated.  Had A-line placed.  Also central line ascites tap did show blood.  Was given 4 units PRBC, 4 units FFP and 2 units platelets and 1 unit of cryo major transfusion protocol Gen Surg consult for hemoperitoneum.  DIC panel positive.  He had some oozing from mouth and IV sites EEG showed symmetric low voltage no/seizure activity 06/01/2023: Extubated 06/03/2023: Transferred to FMTS  Assessment and Plan:   Timothy Hubbard is a [name] w/ h/o seizure disorder, alcohol use disorder, hypertension, mood disorder who was admitted to ICU after cardiac arrest.  It was of unclear etiology but leading differential included seizure secondary to subtherapeutic home valproic acid versus sepsis--Bcx grew Bifidobacterium.  Assessment & Plan Cardiac arrest [facility]) S/p PEA arrest 9/19 and 9/21, now extubated, off pressors and transitioned out of ICU. FULL code.  - Cardiology consulted, appreciate recs Acute respiratory failure with hypercapnia (HCC) Likely multifactoral including CHF exacerbation, fluid overload, R pleural effusion, hx of asthma, atelectasis 2/2 rib pain from CPR.  Baseline mentation. - Currently on 2L  (RA @baseline ). Wean as tolerated.  - Diurese discussed below - Tylenol prn for rib pain.  -Consider further pain management as needed to prevent atelectasis.  - Tx asthma as below - Incentive spirometry + pulm toilet as  indicated - PT OT for deconditioning Acute deep vein thrombosis (DVT) of right peroneal vein (HCC) On Heparin drip (per pharm consult). Per Heme/Onc, will need anticoag for 6 months. Per Heme/Onc, planning to transition to Lovenox BID soon, then oral anticoag at discharge.  Await further heme-onc recs - Cont Heparin drip per pharm consult - AM CBC AKI (acute kidney injury) (HCC) Thought to be ATN 2/2 cardiac arrest. Cr ~4 yesterday (baseline 0.6)--> 3.48. 2.1L UOP yesterday, good response lasix drip (14 L out).  Was initially planned for dialysis, but dialysis cath now removed since patient kidney function is improving.  - Cont Lasix drip  - Nephrology consulted, appreciate recs - Strict I/O's - Daily weights - Avoid nephrotoxic drugs Acute HFrEF (heart failure with reduced ejection fraction) (HCC) New HFrEF (EF 20-25%), likely 2/2 cardiac arrest. Fluid net positive 27L initially, will need continued diuresis. Weight 84.4>84.4.  Echo with LVEF of 55 to 60%.  - Cont Lasix drip for fluid overload  - Cont imdur 30mg  daily - Cardiology consulted, appreciate recs Seizure disorder (HCC) - Cont Keppra 500mg  BID -Seizure precautions - Neurology consulted, appreciate recs.  Hypertension - Cont Hydralazine 25mg  q8h - Cont Lasix drip as above - Holding home amlodipine, lisinopril  Asthma - Cont pulmicort and brovana - Duonebs q4h prn Pleural effusion, right Likely 2/2 fluid overload. - Lasix drip as above Coagulopathy (HCC) Suspect most likely DIC. Less likely TTP. S/p 4 units PRBC, 4 units FFP and 2 units platelets and 1 unit of cryo. Hgb 11.3, plt improving to 188 today. No evidence of active bleed.  - Heme/Onc consulted, appreciate recs -  AM CBC Shock liver 2/2 cardiac arrest. Transaminases steadily downtrending. Abm exam benign.  - monitor daily CMP     FEN/GI: DYS3, Boost.  PPx: Heparin gtt  Dispo: Barriers, oxygen needs to be weaned, needs transition to PO medications, await  PT/OT recs   Subjective:  Doing well. no complaints.  Objective: Temp:  [97.6 F (36.4 C)-98.6 F (37 C)] 97.6 F (36.4 C) (09/29 0606) Pulse Rate:  [84-93] 84 (09/28 2325) Resp:  [14-20] 18 (09/29 0606) BP: (132-151)/(91-98) 144/96 (09/29 0606) SpO2:  [91 %-97 %] 96 % (09/29 0606) Weight:  [84.4 kg] 84.4 kg (09/29 0606) Physical Exam: General: Alert and appropriately responsive  Cardiovascular: RRR Respiratory: CTAB, coarse throughout  Abdomen: NTTP Extremities: Moving extremities   Laboratory: Most recent CBC Lab Results  Component Value Date   WBC 12.4 (H) 06/04/2023   HGB 11.3 (L) 06/04/2023   HCT 34.6 (L) 06/04/2023   MCV 86.9 06/04/2023   PLT 188 06/04/2023   Most recent BMP    Latest Ref Rng & Units 06/04/2023    5:51 AM  BMP  Glucose 70 - 99 mg/dL 83   BUN 6 - 20 mg/dL 37   Creatinine 9.14 - 1.24 mg/dL 7.82   Sodium 956 - 213 mmol/L 139   Potassium 3.5 - 5.1 mmol/L 3.2   Chloride 98 - 111 mmol/L 93   CO2 22 - 32 mmol/L 32   Calcium 8.9 - 10.3 mg/dL 8.4      [name], MD 06/04/2023, 8:46 AM  PGY-3, [facility] FPTS Intern pager: [phone], text pages welcome Secure chat group [facility] Teaching Service

## 2023-06-04 NOTE — Assessment & Plan Note (Signed)
Suspect most likely DIC. Less likely TTP. S/p 4 units PRBC, 4 units FFP and 2 units platelets and 1 unit of cryo. Hgb 11.3, plt improving to 188 today. No evidence of active bleed.  - Heme/Onc consulted, appreciate recs - AM CBC

## 2023-06-04 NOTE — Progress Notes (Addendum)
Went to bedside with Dr. Barbaraann Faster after nurse messaged regarding patient bleeding more profusely from the internal jugular site. Blood was noted outside the dressing. Patient doesn't recall touching it or it bleeding prior to him going to the bathroom, but did have blood on his fingers. Nurse notified us that she helped him to the bedside commode and that there was more bleeding prior to doing so.   Will discontinue the heparin drip temporarily and ordered a stat CBC along with his heparin level. Awaiting results, will transfuse if indicated.  10:25 PM: CBC remained stable with Hgb at 11.7. Spoke with Dr. Deirdre Priest and due to the CBC being stable and the patient having a DVT, will restart the heparin gtt and reassess with morning CBC. Asked nurse to change dressing to monitor bleeding and report any concerns. Will continue to monitor changes overnight.   Fortunato Curling, DO [facility], PGY-1 06/04/23 10:07 PM  Service pager [phone]

## 2023-06-04 NOTE — Progress Notes (Signed)
Nephrology Follow-Up Consult note   Assessment/Recommendations: Timothy Hubbard is a/an 32 y.o. male with a past medical history significant for seizures, htn, and asthma, admitted for seizures c/b cardiac arrest, severe AKI, CHF, bacteremia, coagulopathy, hemoperitoneum, RLE VTE.       AKI, recovering: Likely secondary to ATN associated w/ cardiac arrest and shock.  GN workup was negative.  Now improving -Creatinine improving steadily, excellent urine output with diuretics -Switch Lasix infusion to Lasix 80 mg twice daily -Removed dialysis catheter -Continue to monitor daily Cr, Dose meds for GFR -Monitor Daily I/Os, Daily weight  -Maintain MAP>65 for optimal renal perfusion.  -Avoid nephrotoxic medications including NSAIDs -Use synthetic opioids (Fentanyl/Dilaudid) if needed -Currently no indication for HD  HFrEF: In the setting of recent arrest.  Heart failure team following.  Volume overloaded and undergoing diuresis with lasix infusion.  Responding well.  Sepsis/bacteremia: Completed antibiotic course.  Coagulopathy: Hematology has seen. Mgmt per primary  Seizures: Management per neurology  Hypertension: On hydralazine   Recommendations conveyed to primary service.    Darnell Level  [facility] 06/04/2023 11:36 AM  ___________________________________________________________  CC: Seizures  Interval History/Subjective: Patient feels well today with no complaints.  Extensive diuresis yesterday with creatinine improving down to 3.5.   Medications:  Current Facility-Administered Medications  Medication Dose Route Frequency Provider Last Rate Last Admin   0.9 %  sodium chloride infusion  250 mL Intravenous Continuous Cheri Fowler, MD   Stopped at 05/31/23 1438   0.9 %  sodium chloride infusion   Intravenous PRN Cheri Fowler, MD   Stopped at 05/28/23 1959   acetaminophen (TYLENOL) tablet 650 mg  650 mg Oral Q6H PRN Lincoln Brigham, MD       arformoterol  [facility]) nebulizer solution 15 mcg  15 mcg Nebulization BID Lorin Glass, MD   15 mcg at 06/02/23 0751   budesonide (PULMICORT) nebulizer solution 0.5 mg  0.5 mg Nebulization BID Lidia Collum, PA-C   0.5 mg at 06/02/23 1914   Chlorhexidine Gluconate Cloth 2 % PADS 6 each  6 each Topical Daily Cheri Fowler, MD   6 each at 06/04/23 1000   docusate (COLACE) 50 MG/5ML liquid 100 mg  100 mg Oral BID PRN Lynnell Catalan, MD       doxepin (SINEQUAN) capsule 10 mg  10 mg Oral QHS PRN Jena Gauss, Allee, MD       feeding supplement (BOOST / RESOURCE BREEZE) liquid 1 Container  1 Container Oral TID BM Mannam, Praveen, MD       furosemide (LASIX) injection 80 mg  80 mg Intravenous BID Darnell Level, MD       heparin ADULT infusion 100 units/mL (25000 units/261mL)  2,300 Units/hr Intravenous Continuous Arma Heading, RPH 23 mL/hr at 06/04/23 0925 2,300 Units/hr at 06/04/23 0925   hydrALAZINE (APRESOLINE) tablet 25 mg  25 mg Oral Q8H Laurey Morale, MD   25 mg at 06/04/23 7829   insulin aspart (novoLOG) injection 0-9 Units  0-9 Units Subcutaneous Q4H Bell, Lorin C, RPH   1 Units at 05/29/23 0316   ipratropium-albuterol (DUONEB) 0.5-2.5 (3) MG/3ML nebulizer solution 3 mL  3 mL Nebulization Q4H PRN Pia Mau D, PA-C   3 mL at 06/01/23 2031   isosorbide mononitrate (IMDUR) 24 hr tablet 30 mg  30 mg Oral Daily Laurey Morale, MD   30 mg at 06/04/23 0925   levETIRAcetam (KEPPRA) tablet 500 mg  500 mg Oral BID Lynnell Catalan, MD  500 mg at 06/04/23 1610   lip balm (CARMEX) ointment   Topical PRN Lynnell Catalan, MD   1 Application at 06/01/23 1142   OLANZapine zydis (ZYPREXA) disintegrating tablet 5 mg  5 mg Oral QHS Modena Slater, DO   5 mg at 06/03/23 2008   ondansetron (ZOFRAN) injection 4 mg  4 mg Intravenous Q6H PRN Cheri Fowler, MD       Oral care mouth rinse  15 mL Mouth Rinse PRN Lorin Glass, MD       Oral care mouth rinse  15 mL Mouth Rinse 4 times per day Lynnell Catalan, MD   15 mL at  06/03/23 2230   Oral care mouth rinse  15 mL Mouth Rinse PRN Lynnell Catalan, MD       pantoprazole (PROTONIX) injection 40 mg  40 mg Intravenous Q12H Champ Mungo, DO   40 mg at 06/04/23 0925   polyethylene glycol (MIRALAX / GLYCOLAX) packet 17 g  17 g Oral Daily PRN Lynnell Catalan, MD       potassium chloride (KLOR-CON M) CR tablet 30 mEq  30 mEq Oral Q1 Hr x 3 Camnitz, Will Daphine Deutscher, MD   30 mEq at 06/04/23 1110   sodium chloride flush (NS) 0.9 % injection 10-40 mL  10-40 mL Intracatheter Q12H Lorin Glass, MD   10 mL at 06/04/23 9604   sodium chloride flush (NS) 0.9 % injection 10-40 mL  10-40 mL Intracatheter PRN Lorin Glass, MD       thiamine (VITAMIN B1) tablet 100 mg  100 mg Oral Daily Agarwala, Daleen Bo, MD   100 mg at 06/04/23 5409   white petrolatum (VASELINE) gel   Topical PRN Cheri Fowler, MD          Review of Systems: 10 systems reviewed and negative except per interval history/subjective  Physical Exam: Vitals:   06/04/23 0606 06/04/23 0940  BP: (!) 144/96 (!) 158/98  Pulse:  75  Resp: 18 20  Temp: 97.6 F (36.4 C)   SpO2: 96%    Total I/O In: -  Out: 800 [Urine:800]  Intake/Output Summary (Last 24 hours) at 06/04/2023 1136 Last data filed at 06/04/2023 1044 Gross per 24 hour  Intake 1854.95 ml  Output 6751 ml  Net -4896.05 ml   Constitutional: well-appearing, no acute distress ENMT: ears and nose without scars or lesions, MMM CV: normal rate, 2+ edema of the bilateral lower extremities Respiratory: Bilateral chest rise, normal work of breathing Gastrointestinal: soft, non-tender, no palpable masses or hernias Psych: alert, judgement/insight appropriate, appropriate mood and affect   Test Results I personally reviewed new and old clinical labs and radiology tests Lab Results  Component Value Date   NA 139 06/04/2023   K 3.2 (L) 06/04/2023   CL 93 (L) 06/04/2023   CO2 32 06/04/2023   BUN 37 (H) 06/04/2023   CREATININE 3.48 (H) 06/04/2023   CALCIUM  8.4 (L) 06/04/2023   ALBUMIN 2.3 (L) 06/04/2023   PHOS 3.6 06/01/2023    CBC Recent Labs  Lab 06/02/23 0529 06/02/23 2103 06/03/23 0428 06/04/23 0551  WBC 10.5  --  12.3* 12.4*  HGB 10.6* 10.7* 11.2* 11.3*  HCT 33.1* 33.0* 34.3* 34.6*  MCV 91.7  --  87.9 86.9  PLT 98*  --  149* 188

## 2023-06-04 NOTE — Assessment & Plan Note (Signed)
Thought to be ATN 2/2 cardiac arrest. Cr ~4 yesterday (baseline 0.6)--> 3.48. 2.1L UOP yesterday, good response lasix drip (14 L out).  Was initially planned for dialysis, but dialysis cath now removed since patient kidney function is improving.  - Cont Lasix drip  - Nephrology consulted, appreciate recs - Strict I/O's - Daily weights - Avoid nephrotoxic drugs

## 2023-06-04 NOTE — Progress Notes (Signed)
Rounding Note    Patient Name: Timothy Hubbard Date of Encounter: 06/04/2023  [facility] Cardiologist: None   Subjective   Feeling well.  Creatinine improving, diuresed greater than 4 L yesterday.  Inpatient Medications    Scheduled Meds:  arformoterol  15 mcg Nebulization BID   budesonide (PULMICORT) nebulizer solution  0.5 mg Nebulization BID   Chlorhexidine Gluconate Cloth  6 each Topical Daily   doxepin  10 mg Oral QHS   feeding supplement  1 Container Oral TID BM   furosemide  80 mg Intravenous BID   hydrALAZINE  25 mg Oral Q8H   insulin aspart  0-9 Units Subcutaneous Q4H   isosorbide mononitrate  30 mg Oral Daily   levETIRAcetam  500 mg Oral BID   OLANZapine zydis  5 mg Oral QHS   mouth rinse  15 mL Mouth Rinse 4 times per day   pantoprazole (PROTONIX) IV  40 mg Intravenous Q12H   potassium chloride  40 mEq Oral Once   sodium chloride flush  10-40 mL Intracatheter Q12H   thiamine  100 mg Oral Daily   Continuous Infusions:  sodium chloride Stopped (05/31/23 1438)   sodium chloride Stopped (05/28/23 1959)   heparin 2,100 Units/hr (06/04/23 0708)   PRN Meds: sodium chloride, acetaminophen, docusate, ipratropium-albuterol, lip balm, ondansetron (ZOFRAN) IV, mouth rinse, mouth rinse, polyethylene glycol, sodium chloride flush, white petrolatum   Vital Signs    Vitals:   06/03/23 2008 06/03/23 2015 06/03/23 2325 06/04/23 0606  BP: (!) 151/94  (!) 132/91 (!) 144/96  Pulse:  93 84   Resp:   14 18  Temp:   98.5 F (36.9 C) 97.6 F (36.4 C)  TempSrc:   Oral Oral  SpO2:  97% 96% 96%  Weight:    84.4 kg    Intake/Output Summary (Last 24 hours) at 06/04/2023 0820 Last data filed at 06/04/2023 9811 Gross per 24 hour  Intake 1854.95 ml  Output 5951 ml  Net -4096.05 ml      06/04/2023    6:06 AM 06/03/2023    5:54 AM 06/02/2023    4:51 AM  Last 3 Weights  Weight (lbs) 186 lb 1.1 oz 186 lb 14.4 oz 187 lb 9.8 oz  Weight (kg) 84.4 kg 84.777 kg 85.1 kg       Telemetry    Sinus rhythm-personally reviewed  ECG    None new  Physical Exam   GEN: Well nourished, well developed, in no acute distress  HEENT: normal  Neck: no JVD, carotid bruits, or masses Cardiac: RRR; no murmurs, rubs, or gallops,no edema  Respiratory:  clear to auscultation bilaterally, normal work of breathing GI: soft, nontender, nondistended, + BS MS: no deformity or atrophy  Skin: warm and dry Neuro:  Strength and sensation are intact Psych: euthymic mood, full affect   Labs    High Sensitivity Troponin:   Recent Labs  Lab 05/25/23 1404 05/25/23 1535 05/27/23 1514 05/27/23 1917  TROPONINIHS 35* 54* 210* 240*     Chemistry Recent Labs  Lab 05/31/23 1653 06/01/23 0452 06/02/23 0529 06/03/23 0428 06/03/23 1409 06/04/23 0551  NA 138 139 139 138 139 139  K 3.8 3.5 3.5 3.2* 3.2* 3.2*  CL 91* 94* 92* 93* 91* 93*  CO2 32 31 30 32 33* 32  GLUCOSE 104* 81 85 85 83 83  BUN 29* 31* 37* 39* 38* 37*  CREATININE 4.54* 4.61* 4.29* 4.02* 3.73* 3.48*  CALCIUM 7.8* 8.2* 8.4* 8.2* 8.5*  8.4*  MG 2.0 2.0  --   --  1.6*  --   PROT  --  5.4* 5.2* 5.0*  --  5.0*  ALBUMIN  --  2.4* 2.2* 2.3*  --  2.3*  AST  --  126* 89* 67*  --  49*  ALT  --  154* 128* 101*  --  81*  ALKPHOS  --  88 87 82  --  68  BILITOT  --  2.3* 2.1* 2.2*  --  1.7*  GFRNONAA 17* 16* 18* 19* 21* 23*  ANIONGAP 15 14 17* 13 15 14     Lipids  Recent Labs  Lab 05/31/23 0400  TRIG 66    Hematology Recent Labs  Lab 06/02/23 0529 06/02/23 2103 06/03/23 0428 06/04/23 0551  WBC 10.5  --  12.3* 12.4*  RBC 3.61*  --  3.90* 3.98*  HGB 10.6* 10.7* 11.2* 11.3*  HCT 33.1* 33.0* 34.3* 34.6*  MCV 91.7  --  87.9 86.9  MCH 29.4  --  28.7 28.4  MCHC 32.0  --  32.7 32.7  RDW 17.7*  --  17.9* 18.3*  PLT 98*  --  149* 188   Thyroid No results for input(s): "TSH", "FREET4" in the last 168 hours.  BNPNo results for input(s): "BNP", "PROBNP" in the last 168 hours.  DDimer  Recent Labs  Lab  05/29/23 0226 05/30/23 0542  DDIMER >20.00* >20.00*     Radiology    ECHOCARDIOGRAM COMPLETE  Result Date: 06/03/2023    ECHOCARDIOGRAM REPORT   Patient Name:   Granvil LUKIS BUNT Date of Exam: 06/03/2023 Medical Rec #:  [id number]     Height:       66.0 in Accession #:    [id number]    Weight:       186.9 lb Date of Birth:  [birth year]     BSA:          1.943 m Patient Age:    32 years      BP:           137/97 mmHg Patient Gender: M             HR:           63 bpm. Exam Location:  Inpatient Procedure: 2D Echo, Cardiac Doppler, Color Doppler and Strain Analysis Indications:    Congestive Heart Failure I50.9  History:        Patient has prior history of Echocardiogram examinations. Risk                 Factors:Hypertension.  Sonographer:    Neysa Bonito Roar Referring Phys: [phone] [facility]  Sonographer Comments: Global longitudinal strain was attempted. IMPRESSIONS  1. Left ventricular ejection fraction, by estimation, is 55 to 60%. The left ventricle has normal function. The left ventricle has no regional wall motion abnormalities. Left ventricular diastolic parameters were normal.  2. Right ventricular systolic function is normal. The right ventricular size is normal. There is mildly elevated pulmonary artery systolic pressure. The estimated right ventricular systolic pressure is 42.5 mmHg.  3. Large pleural effusion in the left lateral region.  4. The mitral valve is normal in structure. Trivial mitral valve regurgitation. No evidence of mitral stenosis.  5. The aortic valve has an indeterminant number of cusps. Aortic valve regurgitation is not visualized. No aortic stenosis is present.  6. The inferior vena cava is dilated in size with <50% respiratory variability, suggesting right atrial pressure of 15 mmHg. Comparison(s): Changes from  prior study are noted. LVEF improved from 25% to normal now. FINDINGS  Left Ventricle: Left ventricular ejection fraction, by estimation, is 55 to 60%. The left ventricle  has normal function. The left ventricle has no regional wall motion abnormalities. The left ventricular internal cavity size was normal in size. There is  no left ventricular hypertrophy. Left ventricular diastolic parameters were normal. Right Ventricle: The right ventricular size is normal. No increase in right ventricular wall thickness. Right ventricular systolic function is normal. There is mildly elevated pulmonary artery systolic pressure. The tricuspid regurgitant velocity is 2.62  m/s, and with an assumed right atrial pressure of 15 mmHg, the estimated right ventricular systolic pressure is 42.5 mmHg. Left Atrium: Left atrial size was normal in size. Right Atrium: Right atrial size was normal in size. Pericardium: There is no evidence of pericardial effusion. Mitral Valve: The mitral valve is normal in structure. Trivial mitral valve regurgitation. No evidence of mitral valve stenosis. MV peak gradient, 5.5 mmHg. The mean mitral valve gradient is 2.0 mmHg. Tricuspid Valve: The tricuspid valve is normal in structure. Tricuspid valve regurgitation is mild . No evidence of tricuspid stenosis. Aortic Valve: The aortic valve has an indeterminant number of cusps. Aortic valve regurgitation is not visualized. No aortic stenosis is present. Aortic valve mean gradient measures 3.0 mmHg. Aortic valve peak gradient measures 6.0 mmHg. Aortic valve area, by VTI measures 3.26 cm. Pulmonic Valve: The pulmonic valve was normal in structure. Pulmonic valve regurgitation is trivial. No evidence of pulmonic stenosis. Aorta: The aortic root and ascending aorta are structurally normal, with no evidence of dilitation. Venous: The inferior vena cava is dilated in size with less than 50% respiratory variability, suggesting right atrial pressure of 15 mmHg. IAS/Shunts: No atrial level shunt detected by color flow Doppler. Additional Comments: There is a large pleural effusion in the left lateral region.  LEFT VENTRICLE PLAX 2D  LVIDd:         4.00 cm   Diastology LVIDs:         3.00 cm   LV e' medial:    14.10 cm/s LV PW:         0.70 cm   LV E/e' medial:  7.5 LV IVS:        0.90 cm   LV e' lateral:   15.00 cm/s LVOT diam:     2.00 cm   LV E/e' lateral: 7.1 LV SV:         70 LV SV Index:   36 LVOT Area:     3.14 cm  RIGHT VENTRICLE RV Basal diam:  3.00 cm RV Mid diam:    2.40 cm RV S prime:     11.90 cm/s TAPSE (M-mode): 2.0 cm LEFT ATRIUM             Index        RIGHT ATRIUM           Index LA diam:        3.50 cm 1.80 cm/m   RA Area:     11.50 cm LA Vol (A2C):   55.3 ml 28.46 ml/m  RA Volume:   24.50 ml  12.61 ml/m LA Vol (A4C):   61.6 ml 31.70 ml/m LA Biplane Vol: 58.4 ml 30.06 ml/m  AORTIC VALVE                    PULMONIC VALVE AV Area (Vmax):    2.99 cm     PV  Vmax:          0.94 m/s AV Area (Vmean):   2.93 cm     PV Peak grad:     3.5 mmHg AV Area (VTI):     3.26 cm     PR End Diast Vel: 6.10 msec AV Vmax:           122.00 cm/s  RVOT Peak grad:   3 mmHg AV Vmean:          81.400 cm/s AV VTI:            0.215 m AV Peak Grad:      6.0 mmHg AV Mean Grad:      3.0 mmHg LVOT Vmax:         116.00 cm/s LVOT Vmean:        76.000 cm/s LVOT VTI:          0.223 m LVOT/AV VTI ratio: 1.04  AORTA Ao Sinus diam: 2.30 cm Ao STJ diam:   2.3 cm Ao Asc diam:   2.60 cm MITRAL VALVE                TRICUSPID VALVE MV Area (PHT): 4.57 cm     TR Peak grad:   27.5 mmHg MV Area VTI:   1.81 cm     TR Vmax:        262.00 cm/s MV Peak grad:  5.5 mmHg MV Mean grad:  2.0 mmHg     SHUNTS MV Vmax:       1.17 m/s     Systemic VTI:  0.22 m MV Vmean:      67.2 cm/s    Systemic Diam: 2.00 cm MV Decel Time: 166 msec MV E velocity: 106.00 cm/s MV A velocity: 79.70 cm/s MV E/A ratio:  1.33 Vishnu Priya Mallipeddi Electronically signed by Winfield Rast Mallipeddi Signature Date/Time: 06/03/2023/12:03:33 PM    Final     Cardiac Studies   Echo pending  Patient Profile     32 y.o. male presented to the hospital with seizure and PEA arrest.  Was intubated.   Postextubation had a second PEA arrest with an echo showing a reduced ejection fraction of 20 to 25%.  Assessment & Plan    1.  Acute systolic heart failure: Ejection fraction decreased after PEA arrest, now has normalized.  Patient remains significantly volume overloaded.  Continue IV Lasix.  Creatinine improving with brisk diuresis.  2.  Seizures: Plan per neurology  3.  PEA arrest: Post seizure and postextubation  4.  Hypertension: Continue hydralazine  5.  Acute renal failure: Due to ATN postarrest.  Creatinine improving.  Continue diuresis.  6.  Coagulopathy: Likely due to DIC.  Hematology following  For questions or updates, please contact [facility] Please consult [email or web address] for contact info under        Signed, Dyan Creelman Jorja Loa, MD  06/04/2023, 8:20 AM

## 2023-06-04 NOTE — Assessment & Plan Note (Signed)
-   Cont Keppra 500mg  BID -Seizure precautions - Neurology consulted, appreciate recs.

## 2023-06-04 NOTE — Plan of Care (Signed)
  Problem: Health Behavior/Discharge Planning: Goal: Ability to manage health-related needs will improve Outcome: Progressing   Problem: Clinical Measurements: Goal: Ability to maintain clinical measurements within normal limits will improve Outcome: Progressing Goal: Respiratory complications will improve Outcome: Progressing Goal: Cardiovascular complication will be avoided Outcome: Progressing   Problem: Activity: Goal: Risk for activity intolerance will decrease Outcome: Progressing   Problem: Nutrition: Goal: Adequate nutrition will be maintained Outcome: Progressing   Problem: Safety: Goal: Ability to remain free from injury will improve Outcome: Progressing   Problem: Skin Integrity: Goal: Risk for impaired skin integrity will decrease Outcome: Progressing

## 2023-06-04 NOTE — Progress Notes (Addendum)
ANTICOAGULATION CONSULT NOTE  Pharmacy Consult for heparin Indication: DVT  Allergies  Allergen Reactions   Ibuprofen Anaphylaxis    Patient Measurements: Weight: 84.4 kg (186 lb 1.1 oz) Heparin Dosing Weight: 82 kg  Vital Signs: Temp: 98.7 F (37.1 C) (09/29 2009) Temp Source: Oral (09/29 2009) BP: 146/92 (09/29 2009) Pulse Rate: 90 (09/29 2009)  Labs: Recent Labs    06/03/23 0428 06/03/23 0429 06/03/23 1409 06/03/23 1430 06/04/23 0551 06/04/23 1227 06/04/23 2153  HGB 11.2*  --   --   --  11.3* 11.6* 11.7*  HCT 34.3*  --   --   --  34.6* 35.4* 35.7*  PLT 149*  --   --   --  188 195 192  HEPARINUNFRC  --    < >  --  0.25* 0.19*  --  0.42  CREATININE 4.02*  --  3.73*  --  3.48*  --   --    < > = values in this interval not displayed.    Estimated Creatinine Clearance: 30.7 mL/min (A) (by C-G formula based on SCr of 3.48 mg/dL (H)).   Assessment: 32 yo male who presented with cardiac arrest 2/2 to seizure. Patient now presenting with new DVT. Notable history of abdominopelvic hemorrhage on CT angio (9/21) with massive transfusion. Previously heparin gtt held 2/2 to thrombocytopenia (Plt 20s). Pharmacy consulted restart heparin in the setting of DVT.  Per CCM, okay to anticoagulate using heparin.   9/29 PM update: RN noted bleeding from internal jugular vein at ~1200 today at which time heparin was paused for one hour and resumed at 2200 units/hr.    Heparin level (0.42) is therapeutic on 2200 units/hr.  Evening RN noted persistent oozing from CVC site requiring frequent dressing changes.  MD called to bedside twice - heparin paused briefly pending repeat CBC but ultimately resumed given stable Hgb (11.3 > 11.7) and acute DVT.   Goal of Therapy:  Heparin level 0.3-0.5 units/ml Monitor platelets by anticoagulation protocol: Yes   Plan:  Continue heparin infusion at 2200 units/hr Daily heparin level and CBC while on heparin Monitor for signs and symptoms of  bleeding  Trixie Rude, PharmD Clinical Pharmacist 06/04/2023  10:43 PM

## 2023-06-04 NOTE — Progress Notes (Signed)
Patient has pulled off the dressing and notified that he cant do that due to the possible risk of infection he is exposing himself to. Shanda Bumps RN and I both reinterrated the danger to the patient doing this.

## 2023-06-04 NOTE — Assessment & Plan Note (Signed)
-   Cont Hydralazine 25mg  q8h - Cont Lasix drip as above - Holding home amlodipine, lisinopril

## 2023-06-04 NOTE — Assessment & Plan Note (Signed)
Likely 2/2 fluid overload. - Lasix drip as above

## 2023-06-04 NOTE — Progress Notes (Addendum)
ANTICOAGULATION CONSULT NOTE  Pharmacy Consult for heparin Indication: DVT  Allergies  Allergen Reactions   Ibuprofen Anaphylaxis    Patient Measurements: Weight: 84.4 kg (186 lb 1.1 oz) Heparin Dosing Weight: 82 kg  Vital Signs: Temp: 97.6 F (36.4 C) (09/29 0606) Temp Source: Oral (09/29 0606) BP: 144/96 (09/29 0606) Pulse Rate: 84 (09/28 2325)  Labs: Recent Labs    06/02/23 0529 06/02/23 1756 06/02/23 2103 06/03/23 0428 06/03/23 0429 06/03/23 1409 06/03/23 1430 06/04/23 0551  HGB 10.6*  --  10.7* 11.2*  --   --   --  11.3*  HCT 33.1*  --  33.0* 34.3*  --   --   --  34.6*  PLT 98*  --   --  149*  --   --   --  188  HEPARINUNFRC 0.21*   < >  --   --  0.23*  --  0.25* 0.19*  CREATININE 4.29*  --   --  4.02*  --  3.73*  --  3.48*   < > = values in this interval not displayed.    Estimated Creatinine Clearance: 30.7 mL/min (A) (by C-G formula based on SCr of 3.48 mg/dL (H)).   Assessment: 32 yo male who presented with cardiac arrest 2/2 to seizure. Patient now presenting with new DVT. Notable history of abdominopelvic hemorrhage on CT angio (9/21) with massive transfusion. Previously heparin gtt held 2/2 to thrombocytopenia (Plt 20s). Pharmacy consulted restart heparin in the setting of DVT.  Per CCM, okay to anticoagulate using heparin.   9/29 AM: Heparin level 0.19, subtherapeutic on 2100 units/hr. No issues with infusion running or signs of bleeding per RN. Hgb stable at 11.3, Plt have increased to 188.  Goal of Therapy:  Heparin level 0.3-0.5 units/ml Monitor platelets by anticoagulation protocol: Yes   Plan:  Increase heparin infusion at 2300 units/hr Heparin level in 6 hours Daily heparin level and CBC while on heparin Monitor for signs and symptoms of bleeding  ADDENDUM: Patient bleeding from his internal jugular per RN. Heparin infusion stopped for ~1 hour, will resume heparin infusion at 2200 units/hr and recheck HL in 6 hours.   Enos Fling,  PharmD PGY-1 Acute Care Pharmacy Resident 06/04/2023 7:22 AM

## 2023-06-04 NOTE — Assessment & Plan Note (Signed)
On Heparin drip (per pharm consult). Per Heme/Onc, will need anticoag for 6 months. Per Heme/Onc, planning to transition to Lovenox BID soon, then oral anticoag at discharge.  Await further heme-onc recs - Cont Heparin drip per pharm consult - AM CBC

## 2023-06-04 NOTE — Progress Notes (Signed)
Notified to restart heparin at 2200 unit

## 2023-06-04 NOTE — Assessment & Plan Note (Signed)
S/p PEA arrest 9/19 and 9/21, now extubated, off pressors and transitioned out of ICU. FULL code.  - Cardiology consulted, appreciate recs

## 2023-06-04 NOTE — Progress Notes (Signed)
Patient paged to get to the [facility] and I noted blood to his internal jugular site. I paged to on call to [facility] family medicine and noted to pause it. After evaluation, was notified to hold it at this time. Pharmacy was in the room at this time.

## 2023-06-04 NOTE — Progress Notes (Signed)
Mobility Specialist Progress Note    06/04/23 1535  Mobility  Activity Transferred to/from [facility]  Level of Assistance Contact guard assist, steadying assist  Assistive Device Other (Comment) (HHA)  Distance Ambulated (ft) 4 ft (2+2)  Activity Response Tolerated well  Mobility Referral Yes  $Mobility charge 1 Mobility  Mobility Specialist Start Time (ACUTE ONLY) 1528  Mobility Specialist Stop Time (ACUTE ONLY) 1534  Mobility Specialist Time Calculation (min) (ACUTE ONLY) 6 min   Pt received and declined ambulation d/t needing to have BM. Had BM on BSC. Pt then went back to bed and stated "I'm tired" and declined further ambulation. Left with call bell in reach.   [place] Mobility Specialist  Please Neurosurgeon or Rehab Office at [phone]

## 2023-06-04 NOTE — Assessment & Plan Note (Signed)
New HFrEF (EF 20-25%), likely 2/2 cardiac arrest. Fluid net positive 27L initially, will need continued diuresis. Weight 84.4>84.4.  Echo with LVEF of 55 to 60%.  - Cont Lasix drip for fluid overload  - Cont imdur 30mg  daily - Cardiology consulted, appreciate recs

## 2023-06-04 NOTE — Progress Notes (Addendum)
FMTS Interim Progress Note  S: Went to bedside with Dr. Barbaraann Faster. Patient was resting comfortably in bed in NAD. Nurse reported that she noted more oozing and outlined where the blood was since the last time she changed his dressing at 8:30. Patient reports no throbbing or pain at the internal jugular site.  O: BP (!) 146/92 (BP Location: Right Arm)   Pulse 90   Temp 98.7 F (37.1 C) (Oral)   Resp 18   Wt 84.4 kg   SpO2 100%   BMI 30.03 kg/m   General: Awake and Alert in NAD HEENT: Normocephalic, atraumatic. R subconjunctival hemorrhage. Internal jugular slight bleeding contained within dressing, no active signs of bleeding.  Respiratory: normal WOB on 2L Neuro: No focal neurological deficits.  A/P: Acute DVT and Coagulopathy Patient is stable and has limiting bleeding within the bandage site. No bleeding from any other sites. Patient is saturating well and in no acute distress. - Continue heparin gtt - Will monitor bleeding overnight and assess the need to get labs, stop heparin, and consult CCM - AM CBC  Rest of plan per day team note.  Fortunato Curling, DO 06/04/2023, 9:11 PM PGY-1, [facility] Family Medicine Service pager [phone]

## 2023-06-04 NOTE — Assessment & Plan Note (Signed)
2/2 cardiac arrest. Transaminases steadily downtrending. Abm exam benign.  - monitor daily CMP

## 2023-06-05 ENCOUNTER — Inpatient Hospital Stay (HOSPITAL_COMMUNITY): Payer: Medicare Other

## 2023-06-05 DIAGNOSIS — I469 Cardiac arrest, cause unspecified: Secondary | ICD-10-CM | POA: Diagnosis not present

## 2023-06-05 LAB — CBC
HCT: 34.6 % — ABNORMAL LOW (ref 39.0–52.0)
Hemoglobin: 11.4 g/dL — ABNORMAL LOW (ref 13.0–17.0)
MCH: 29.6 pg (ref 26.0–34.0)
MCHC: 32.9 g/dL (ref 30.0–36.0)
MCV: 89.9 fL (ref 80.0–100.0)
Platelets: 191 10*3/uL (ref 150–400)
RBC: 3.85 MIL/uL — ABNORMAL LOW (ref 4.22–5.81)
RDW: 18.7 % — ABNORMAL HIGH (ref 11.5–15.5)
WBC: 11.8 10*3/uL — ABNORMAL HIGH (ref 4.0–10.5)
nRBC: 0 % (ref 0.0–0.2)

## 2023-06-05 LAB — COMPREHENSIVE METABOLIC PANEL
ALT: 73 U/L — ABNORMAL HIGH (ref 0–44)
AST: 44 U/L — ABNORMAL HIGH (ref 15–41)
Albumin: 2.4 g/dL — ABNORMAL LOW (ref 3.5–5.0)
Alkaline Phosphatase: 70 U/L (ref 38–126)
Anion gap: 14 (ref 5–15)
BUN: 37 mg/dL — ABNORMAL HIGH (ref 6–20)
CO2: 31 mmol/L (ref 22–32)
Calcium: 8.5 mg/dL — ABNORMAL LOW (ref 8.9–10.3)
Chloride: 95 mmol/L — ABNORMAL LOW (ref 98–111)
Creatinine, Ser: 3.36 mg/dL — ABNORMAL HIGH (ref 0.61–1.24)
GFR, Estimated: 24 mL/min — ABNORMAL LOW (ref >60)
Glucose, Bld: 82 mg/dL (ref 70–99)
Potassium: 3.2 mmol/L — ABNORMAL LOW (ref 3.5–5.1)
Sodium: 140 mmol/L (ref 135–145)
Total Bilirubin: 1.8 mg/dL — ABNORMAL HIGH (ref 0.3–1.2)
Total Protein: 5.1 g/dL — ABNORMAL LOW (ref 6.5–8.1)

## 2023-06-05 LAB — GLUCOSE, CAPILLARY
Glucose-Capillary: 71 mg/dL (ref 70–99)
Glucose-Capillary: 73 mg/dL (ref 70–99)
Glucose-Capillary: 74 mg/dL (ref 70–99)
Glucose-Capillary: 77 mg/dL (ref 70–99)
Glucose-Capillary: 81 mg/dL (ref 70–99)
Glucose-Capillary: 83 mg/dL (ref 70–99)
Glucose-Capillary: 85 mg/dL (ref 70–99)

## 2023-06-05 LAB — COOXEMETRY PANEL
Carboxyhemoglobin: 3.8 % — ABNORMAL HIGH (ref 0.5–1.5)
Methemoglobin: <0.7 % (ref 0.0–1.5)
O2 Saturation: 73.1 %
Total hemoglobin: 11.7 g/dL — ABNORMAL LOW (ref 12.0–16.0)

## 2023-06-05 LAB — HEPARIN LEVEL (UNFRACTIONATED): Heparin Unfractionated: 0.42 [IU]/mL (ref 0.30–0.70)

## 2023-06-05 MED ORDER — HYDRALAZINE HCL 25 MG PO TABS
37.5000 mg | ORAL_TABLET | Freq: Three times a day (TID) | ORAL | Status: DC
  Administered 2023-06-05 – 2023-06-06 (×3): 37.5 mg via ORAL
  Filled 2023-06-05 (×3): qty 2

## 2023-06-05 MED ORDER — APIXABAN 5 MG PO TABS
5.0000 mg | ORAL_TABLET | Freq: Two times a day (BID) | ORAL | Status: DC
  Administered 2023-06-05 – 2023-06-06 (×2): 5 mg via ORAL
  Filled 2023-06-05 (×4): qty 1

## 2023-06-05 MED ORDER — POTASSIUM CHLORIDE CRYS ER 20 MEQ PO TBCR
40.0000 meq | EXTENDED_RELEASE_TABLET | Freq: Once | ORAL | Status: AC
  Administered 2023-06-05: 40 meq via ORAL
  Filled 2023-06-05: qty 2

## 2023-06-05 NOTE — Assessment & Plan Note (Signed)
-   Cont Hydralazine 25mg  q8h - Cont Lasix drip as above - Holding home amlodipine, lisinopril

## 2023-06-05 NOTE — TOC Initial Note (Signed)
Transition of Care [facility]) - Initial/Assessment Note    Patient Details  Name: Timothy Hubbard MRN: [id number] Date of Birth: 02-Aug-1991  [facility]) CM/SW Contact:    Nicanor Bake Phone Number: [phone] 06/05/2023, 3:38 PM  Clinical Narrative: HF CSW attempted to meet with the pt at bedside, but pt had nurses in the room. HF CSW will follow up with pt at a later time.   TOC will continue following.                         Patient Goals and CMS Choice            Expected Discharge Plan and Services                                              Prior Living Arrangements/Services                       Activities of Daily Living      Permission Sought/Granted                  Emotional Assessment              Admission diagnosis:  Cardiac arrest [facility]) [I46.9] Lactic acidosis [E87.20] Seizure (HCC) [R56.9] Patient Active Problem List   Diagnosis Date Noted   Acute deep vein thrombosis (DVT) of right peroneal vein (HCC) 06/03/2023   AKI (acute kidney injury) (HCC) 06/03/2023   Acute HFrEF (heart failure with reduced ejection fraction) (HCC) 06/03/2023   Pleural effusion, right 06/03/2023   Coagulopathy (HCC) 06/03/2023   Shock liver 06/03/2023   Lactic acidosis 05/26/2023   Cardiac arrest (HCC) 05/25/2023   Acute respiratory failure with hypercapnia (HCC) 05/25/2023   Back pain 05/22/2023   Abnormal weight loss 05/22/2023   Hypokalemia 04/22/2023   Prolonged QT interval 04/22/2023   Oral candidiasis 04/22/2023   Bilateral lower extremity edema 02/14/2023   Benzodiazepine abuse, 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   History of cocaine abuse (HCC) 12/25/2022   Tachycardia 09/10/2022   Post-nasal drip 07/15/2022   Cognitive impairment 07/15/2022   Impaired instrumental activities of daily living (IADL) 07/15/2022   Ambulatory dysfunction 04/06/2022   Mood disorder (HCC) 03/31/2022   Grade III internal hemorrhoids     Iron deficiency anemia 03/04/2022   Seizure disorder (HCC)    Alcohol use disorder 10/21/2016   Gastroesophageal reflux disease 06/13/2016   Hypertension 04/01/2016   Asthma 04/01/2016   TOBACCO USER 06/13/2009   PCP:  Vonna Drafts, MD Pharmacy:   [facility] [address] - [place], [place] - [address] [facility] [address] [place] [address] Phone: [phone] Fax: [phone]  [facility] - [place], [place] - [address] [place] [address] Phone: [phone] Fax: [phone]  [id number] - [place], [place] - [address] AT [facility] [address] [place] [address] Phone: [phone] Fax: [phone]     Social Determinants of Health (SDOH) Social History: SDOH Screenings   Food Insecurity: No Food Insecurity (04/23/2023)  Housing: Low Risk  (04/23/2023)  Transportation Needs: No Transportation Needs (04/23/2023)  Utilities: Not At Risk (04/23/2023)  Alcohol Screen: Low Risk  (12/16/2022)  Depression (PHQ2-9): High Risk (05/22/2023)  Financial Resource Strain: Medium  Risk (04/01/2022)  Stress: Stress Concern Present (01/05/2023)  Tobacco Use: High Risk (05/25/2023)   SDOH Interventions:     Readmission Risk Interventions     No data to display

## 2023-06-05 NOTE — Assessment & Plan Note (Addendum)
New HFrEF (EF 20-25%), likely 2/2 cardiac arrest. Fluid net positive 27L initially, will need continued diuresis. Weight 84->76.  Echo with LVEF of 55 to 60%.  - Cont Lasix drip for fluid overload  - Cont imdur 30mg  daily - Cardiology consulted, appreciate recs

## 2023-06-05 NOTE — Assessment & Plan Note (Signed)
Likely 2/2 fluid overload. - Lasix drip as above

## 2023-06-05 NOTE — Progress Notes (Signed)
PT Cancellation Note  Patient Details Name: ASTON LIESKE MRN: [id number] DOB: [birth year]   Cancelled Treatment:    Reason Eval/Treat Not Completed: Other (comment) (Pt refused adamantly.  Nurse made aware.)   Bevelyn Buckles 06/05/2023, 1:50 PM Azariyah Luhrs M,PT Acute Rehab Services [phone]

## 2023-06-05 NOTE — Assessment & Plan Note (Addendum)
Thought to be ATN 2/2 cardiac arrest. Cr ~4 yesterday (baseline 0.6)--> 3.48. 2.1L UOP yesterday, good response lasix drip (14 L out).  Was initially planned for dialysis, but dialysis cath now removed since patient kidney function is improving. Running an ANCA analysis to look for possible autoimmune vasculitides  - Cont Lasix drip 80mg  bid - Nephrology consulted, appreciate recs - Strict I/O's - Daily weights - Avoid nephrotoxic drugs - Preferred narcotics for pain control will be hydromorphone, fentanyl, and methadone

## 2023-06-05 NOTE — Progress Notes (Signed)
Patient refused, Q4 CV monitoring and seizure protective pads around the bed,  BP UP, gave scheduled meds, in bed, call light within reach, will notify attending and continues to monitor.

## 2023-06-05 NOTE — Plan of Care (Signed)
See note for pt update. No cute changes noted. Will continue to monitor patient

## 2023-06-05 NOTE — Assessment & Plan Note (Addendum)
Likely multifactoral including CHF exacerbation, fluid overload, R pleural effusion, hx of asthma, atelectasis 2/2 rib pain from CPR.  Baseline mentation. - Currently on room air  - Diurese discussed below - Tylenol prn for rib pain.  -Consider further pain management as needed to prevent atelectasis.  - Tx asthma as below - Incentive spirometry + pulm toilet as indicated - PT OT for deconditioning

## 2023-06-05 NOTE — Assessment & Plan Note (Signed)
Suspect most likely DIC. Less likely TTP. S/p 4 units PRBC, 4 units FFP and 2 units platelets and 1 unit of cryo. Hgb 11.3, plt improving to 188 today. No evidence of active bleed.  - Heme/Onc consulted, appreciate recs - AM CBC

## 2023-06-05 NOTE — Assessment & Plan Note (Signed)
-   Cont pulmicort and brovana - Duonebs q4h prn

## 2023-06-05 NOTE — Progress Notes (Signed)
Advanced Heart Failure Rounding Note  PCP-Cardiologist: None   Subjective:     Mixed venous sat 73%. Has not needed inotrope support.  Cr continues to improve, 3.36 today.  Good diuresis with IV lasix. CVP 8. Weight does not appear accurate.   Hypertensive today but otherwise no complaints.    Objective:   Weight Range: 76.6 kg Body mass index is 27.26 kg/m.   Vital Signs:   Temp:  [98.1 F (36.7 C)-98.8 F (37.1 C)] 98.3 F (36.8 C) (09/30 0710) Pulse Rate:  [75-90] 89 (09/30 0400) Resp:  [18-20] 20 (09/30 0710) BP: (139-158)/(81-98) 152/94 (09/30 0335) SpO2:  [92 %-100 %] 95 % (09/30 0400) Weight:  [76.6 kg] 76.6 kg (09/30 0625) Last BM Date : 06/04/23  Weight change: Filed Weights   06/03/23 0554 06/04/23 0606 06/05/23 0625  Weight: 84.8 kg 84.4 kg 76.6 kg    Intake/Output:   Intake/Output Summary (Last 24 hours) at 06/05/2023 0936 Last data filed at 06/05/2023 0918 Gross per 24 hour  Intake 882.08 ml  Output 4800 ml  Net -3917.92 ml      Physical Exam    General:  Well appearing. No resp difficulty HEENT: Normal Neck: Supple. JVP . Carotids 2+ bilat; no bruits. No lymphadenopathy or thyromegaly appreciated. Cor: PMI nondisplaced. Regular rate & rhythm. No rubs, gallops or murmurs. Lungs: Clear Abdomen: Soft, nontender, nondistended. No hepatosplenomegaly. No bruits or masses. Good bowel sounds. Extremities: No cyanosis, clubbing, rash, edema Neuro: Alert & orientedx3, cranial nerves grossly intact. moves all 4 extremities w/o difficulty. Affect pleasant   Telemetry   NSR  EKG    06/02/23: NSR  Labs    CBC Recent Labs    06/04/23 1227 06/04/23 2153 06/05/23 0442  WBC 12.2* 12.1* 11.8*  NEUTROABS 7.5  --   --   HGB 11.6* 11.7* 11.4*  HCT 35.4* 35.7* 34.6*  MCV 87.0 86.2 89.9  PLT 195 192 191   Basic Metabolic Panel Recent Labs    16/10/96 1409 06/04/23 0551 06/05/23 0442  NA 139 139 140  K 3.2* 3.2* 3.2*  CL 91* 93* 95*   CO2 33* 32 31  GLUCOSE 83 83 82  BUN 38* 37* 37*  CREATININE 3.73* 3.48* 3.36*  CALCIUM 8.5* 8.4* 8.5*  MG 1.6* 1.8  --    Liver Function Tests Recent Labs    06/04/23 0551 06/05/23 0442  AST 49* 44*  ALT 81* 73*  ALKPHOS 68 70  BILITOT 1.7* 1.8*  PROT 5.0* 5.1*  ALBUMIN 2.3* 2.4*    Other results:   Imaging    No results found.   Medications:     Scheduled Medications:  arformoterol  15 mcg Nebulization BID   budesonide (PULMICORT) nebulizer solution  0.5 mg Nebulization BID   Chlorhexidine Gluconate Cloth  6 each Topical Daily   feeding supplement  1 Container Oral TID BM   folic acid  1 mg Oral Daily   furosemide  80 mg Intravenous BID   hydrALAZINE  25 mg Oral Q8H   insulin aspart  0-9 Units Subcutaneous Q4H   isosorbide mononitrate  30 mg Oral Daily   levETIRAcetam  500 mg Oral BID   OLANZapine zydis  5 mg Oral QHS   mouth rinse  15 mL Mouth Rinse 4 times per day   pantoprazole (PROTONIX) IV  40 mg Intravenous Q12H   sodium chloride flush  10-40 mL Intracatheter Q12H   thiamine  100 mg Oral Daily  Infusions:  sodium chloride Stopped (05/31/23 1438)   sodium chloride Stopped (05/28/23 1959)   heparin 2,200 Units/hr (06/05/23 0912)    PRN Medications: sodium chloride, acetaminophen, docusate, doxepin, ipratropium-albuterol, lip balm, ondansetron (ZOFRAN) IV, mouth rinse, mouth rinse, polyethylene glycol, sodium chloride flush, white petrolatum    Patient Profile   Timothy DORFMAN is a 32 y.o. AAM with seizures, mood disorder, HTN and asthma. Admitted with seizures and PEA arrest. AHF consulted for acute systolic heart failure post PEA arrest.   Assessment/Plan   Acute systolic heart failure -Suspect stress cardiomyopathy after PEA arrest. Echo 09/20 after 1st PEA arrest showed EF 60-65%. Echo after 2nd PEA arrest on 09/20 with EF 20-25%, RV severely reduced, mild MR, moderate to severe TR.  -NYHA IV on admission -Repeat TTE on 06/03/23 with  recovery of LVEF (55%-60%)  -Today on exam, euvolemic; CVP 8 No PND, orthopnea and LE edema. Continues to feel very weak.  -Remains hypertensive; will increase hydralazine to 37.5mg  TID -Nephrology following closely; sCr now 3.36 from 3.4 yesterday. Agree with IV lasix.  -At this time heart failure will follow from afar with plan to add on GDMT once renal function allows.    PEA arrest - in setting of seizures - likely not primarily cardiac driven   HTN - Remains hypertensive; increase hydralazine 37.5mg  TID.   Seizures -Had been off seizure medications -Followed by neurology -Continue Keppra   AKI -Suspected ATN postarrest -Baseline SCr around 0.6; continuing to improve. See above.  -Nephrology following -Avoid hypotension   Shock liver -Post-arrest -Follow CMET, improving   RLE DVT -On heparin   8.  Substance abuse/ETOH abuse -Cessation advised   9. Coagulopathy, DIC - Complicated by hemoperitoneum and RLE DVT - required MTP - suspected 2/2 shock liver - Heme following - back on Hepatin gtt, follow CBC - no further bleeding    Length of Stay: 11  Darreld Hoffer 11:10 AM  Advanced Heart Failure Team Pager [phone] (M-F; 7a - 5p)  Please contact [facility] for night-coverage after hours (5p -7a ) and weekends on [email or web address]

## 2023-06-05 NOTE — Progress Notes (Signed)
Mobility Specialist Progress Note:   06/05/23 1500  Mobility  Activity Ambulated with assistance in hallway  Level of Assistance Contact guard assist, steadying assist  Assistive Device Front wheel walker  Distance Ambulated (ft) 400 ft  Activity Response Tolerated well  Mobility Referral Yes  $Mobility charge 1 Mobility  Mobility Specialist Start Time (ACUTE ONLY) 1516  Mobility Specialist Stop Time (ACUTE ONLY) 1530  Mobility Specialist Time Calculation (min) (ACUTE ONLY) 14 min    Pre Mobility: 110 HR During Mobility: 121 HR Post Mobility:  100 HR  Received pt in bed having no complaints and agreeable to mobility. Pt was asymptomatic throughout ambulation and returned to room w/o fault. Left in chair w/ call bell in reach and all needs met. Family present.   D'Vante Earlene Plater Mobility Specialist Please contact via Special educational needs teacher or Rehab office at [phone]

## 2023-06-05 NOTE — Progress Notes (Signed)
Heart Failure Navigator Progress Note  Assessed for Heart & Vascular TOC clinic readiness.  Patient does not meet criteria due to Advanced Heart Failure Team consulted with Dr. Shirlee Latch.   Navigator will sign off at this time.  Rhae Hammock, BSN, Scientist, clinical (histocompatibility and immunogenetics) Only

## 2023-06-05 NOTE — Progress Notes (Signed)
ANTICOAGULATION CONSULT NOTE  Pharmacy Consult for heparin > apixaban Indication: DVT  Allergies  Allergen Reactions   Ibuprofen Anaphylaxis    Patient Measurements: Weight: 76 kg (167 lb 8 oz) Heparin Dosing Weight: 82 kg  Vital Signs: Temp: 98.1 F (36.7 C) (09/30 1327) Temp Source: Oral (09/30 1327) Pulse Rate: 83 (09/30 1143)  Labs: Recent Labs    06/03/23 1409 06/03/23 1430 06/04/23 0551 06/04/23 1227 06/04/23 2153 06/05/23 0442 06/05/23 1330  HGB  --   --  11.3* 11.6* 11.7* 11.4*  --   HCT  --   --  34.6* 35.4* 35.7* 34.6*  --   PLT  --   --  188 195 192 191  --   HEPARINUNFRC  --    < > 0.19*  --  0.42  --  0.42  CREATININE 3.73*  --  3.48*  --   --  3.36*  --    < > = values in this interval not displayed.    Estimated Creatinine Clearance: 28.2 mL/min (A) (by C-G formula based on SCr of 3.36 mg/dL (H)).   Assessment: 32 yo male who presented with cardiac arrest secondary to seizure. Patient now presenting with new DVT. Notable history of abdominopelvic hemorrhage on CT angio (9/21) with massive transfusion. Anticoagulation was complicated by thrombocytopenia (Plt 20s) and ongoing oozing from internal jugular catheter site.    Patient has clinically improved and transferred out of the ICU.  Family Medicine team has confirmed with heme, ok to start oral anticoagulation.  PLTC now >150.  Hgb stable 11s.  Will start apixaban.  Discussed risk benefit of bleeding / accumulation of the apixaban loading dose of 10mg  bid with the team.  Decision was made to omit this dose in the setting of poor renal function and given patient has received heparin for > 7 days since initial diagnosis of clot.  Goal of Therapy:  Therapeutic anticoagulation Monitor platelets by anticoagulation protocol: Yes   Plan:   Stop heparin. Start Apixaban 5mg  PO BID Will complete apixaban copay check and discharge education.  [employer], Pharm.D., BCPS Clinical  Pharmacist Clinical phone for 06/05/2023 from 7:30-3:00 is [phone].  **Pharmacist phone directory can be found on [email or web address] listed under [facility].  06/05/2023 4:02 PM

## 2023-06-05 NOTE — Assessment & Plan Note (Addendum)
-   Cont Keppra 500mg  BID -Seizure precautions - Neurology consulted, appreciate recs.

## 2023-06-05 NOTE — Progress Notes (Signed)
Today the patient's foley catheter was removed.  They patient has was given a urinal in the room and he gave understanding on how to use it. Patient has had three linen changes due to patient not calling out or using his urinal. Just getting up and using his trash can or wetting the bed as told by the PCT. I went to give him his medications @ 1324 and learned he had pulled off his condom cath off and wet his bed. The patient has a male purwick placed. At this time, the patient has refused his PT and mobility since Saturday. At this time I have spoken with the patient and notified him of his treatment and he needs to participate in order to get better. If he doesn't then he will deteriorate or remain in the same condition but is all up to him and what he wants to do.  He has been educated on his central line as well as calling out of help when needed.    1550 patient mom came up and was upset that his floor was wet and why he had so many things in his room. I notified her tht he has been urinating on floor and on himself quite often. So we were in the process of cleaning up. Answered questions about his care and things that are going on. Will continue to monitor patient.

## 2023-06-05 NOTE — Progress Notes (Signed)
Daily Progress Note Intern Pager: [phone]  Patient name: Timothy Hubbard Medical record number: [id number] Date of birth: 09-20-1990 Age: 32 y.o. Gender: male  Primary Care Provider: Vonna Drafts, MD Consultants: Cardiology/Heart Failure Code Status: Full  Pt Overview and Major Events to Date:  Timothy Hubbard is a 32 year old male with PMH of seizures, hypertension and asthma who presented after witnessed seizure with PEA arrest x 2 who was intubated and transferred to ICU 9/19 and out to FMTS on 9/28.  9/19 2 PEA arrests ROSC achieved, admit to ICU; seizure and cardiac arrest 9/20 extubated 05/27/2023: PEA arrest and after woke up and was confused.  Total of 2 rounds of CPR with epi and bicarb x 1.  But became bradycardic > intubated.  Had A-line placed.  Also central line ascites tap did show blood.  Was given 4 units PRBC, 4 units FFP and 2 units platelets and 1 unit of cryo major transfusion protocol Cc consult for hemoperitoneum.  DIC panel positive.  He had some oozing from mouth and IV sites EEG showed symmetric low voltage no/seizure activity 06/01/2023: Extubated 06/03/2023: Transferred to FMTS 06/05/2023: Transitioning to peripheral IV, switching heparin to oral anticoagulant (eliquis)   Assessment and Plan: Assessment & Plan Acute HFrEF (heart failure with reduced ejection fraction) (HCC) New HFrEF (EF 20-25%), likely 2/2 cardiac arrest. Fluid net positive 27L initially, will need continued diuresis. Weight 84->76.  Echo with LVEF of 55 to 60%.  - Cont Lasix drip for fluid overload  - Cont imdur 30mg  daily - Cardiology consulted, appreciate recs Acute respiratory failure with hypercapnia (HCC) Likely multifactoral including CHF exacerbation, fluid overload, R pleural effusion, hx of asthma, atelectasis 2/2 rib pain from CPR.  Baseline mentation. - Currently on room air  - Diurese discussed below - Tylenol prn for rib pain.  -Consider further pain management as needed  to prevent atelectasis.  - Tx asthma as below - Incentive spirometry + pulm toilet as indicated - PT OT for deconditioning Acute deep vein thrombosis (DVT) of right peroneal vein (HCC) On Heparin drip (per pharm consult). Per Heme/Onc, will need anticoag for 6 months. Per Heme/Onc, transitioning to Eliquis for VTE treatment today and evaluate him for possibly a lower dose for discharge. - Eliquis 5 mg BID dosing - AM CBC AKI (acute kidney injury) (HCC) Thought to be ATN 2/2 cardiac arrest. Cr ~4 yesterday (baseline 0.6)--> 3.48. 2.1L UOP yesterday, good response lasix drip (14 L out).  Was initially planned for dialysis, but dialysis cath now removed since patient kidney function is improving. Running an ANCA analysis to look for possible autoimmune vasculitides  - Cont Lasix drip 80mg  bid - Nephrology consulted, appreciate recs - Strict I/O's - Daily weights - Avoid nephrotoxic drugs - Preferred narcotics for pain control will be hydromorphone, fentanyl, and methadone   Cardiac arrest (HCC) S/p PEA arrest 9/19 and 9/21, now extubated, off pressors and transitioned out of ICU. FULL code.  What - Cardiology consulted, appreciate recs Seizure disorder (HCC) - Cont Keppra 500mg  BID - Seizure precautions - Neurology consulted, appreciate recs.  Hypertension - Cont Hydralazine 25mg  q8h - Cont Lasix drip as above - Holding home amlodipine, lisinopril  Asthma - Cont pulmicort and brovana - Duonebs q4h prn Pleural effusion, right Likely 2/2 fluid overload. - Lasix drip as above Coagulopathy (HCC) Suspect most likely DIC. Less likely TTP. S/p 4 units PRBC, 4 units FFP and 2 units platelets and 1 unit of cryo. Hgb 11.3,  plt improving to 188 today. No evidence of active bleed.  - Heme/Onc consulted, appreciate recs - AM CBC Shock liver 2/2 cardiac arrest. Transaminases steadily downtrending. Abm exam benign.  - monitor daily CMP   Chronic and Stable Problems:  Mood disorder:  olanzapine 5 mg nightly   FEN/GI: DYS3, Boost.  PPx: Heparin gtt  Dispo: Barriers, oxygen needs to be weaned, needs transition to PO medications, await PT/OT recs   Subjective:  Pt was resting comfortably in bed at time of exam. He mentioned a rough night of sleep but was willing to cooperate with exam.  Objective: Temp:  [98.1 F (36.7 C)-98.8 F (37.1 C)] 98.3 F (36.8 C) (09/30 0710) Pulse Rate:  [75-90] 89 (09/30 0400) Resp:  [18-20] 20 (09/30 0710) BP: (139-158)/(81-98) 152/94 (09/30 0335) SpO2:  [92 %-100 %] 95 % (09/30 0400) Weight:  [76.6 kg] 76.6 kg (09/30 0625)  Physical Exam: General: Pt was alert, oriented to person, place, situation, date. He was sleepy but pleasant and cooperative. Cardiovascular: RRR, possible systolic murmur heard on auscultation. No gallops or rubs Respiratory: CTAB. Pt was breathing comfortably on room air. Abdomen: NTTP, soft, non-distended. Extremities: PT moving his limbs freely and independently. Strength in LE 4/5. Patient cited fatigue, likely due to deconditioning.  Laboratory: Most recent CBC Lab Results  Component Value Date   WBC 11.8 (H) 06/05/2023   HGB 11.4 (L) 06/05/2023   HCT 34.6 (L) 06/05/2023   MCV 89.9 06/05/2023   PLT 191 06/05/2023   Most recent BMP    Latest Ref Rng & Units 06/05/2023    4:42 AM  BMP  Glucose 70 - 99 mg/dL 82   BUN 6 - 20 mg/dL 37   Creatinine 1.61 - 1.24 mg/dL 0.96   Sodium 045 - 409 mmol/L 140   Potassium 3.5 - 5.1 mmol/L 3.2   Chloride 98 - 111 mmol/L 95   CO2 22 - 32 mmol/L 31   Calcium 8.9 - 10.3 mg/dL 8.5     Other pertinent labs: Awaiting ANCA (per nephrology)   Imaging/Diagnostic Tests: CXR 9/30/20224 Radiologist Impression:  IMPRESSION: Bilateral pleural effusions, at least moderate in degree, slightly worsened from prior exam. Underlying bilateral lung opacities are unchanged.   Margaretmary Dys, MD 06/05/2023, 7:59 AM  PGY-1, [facility] Family  Medicine FPTS Intern pager: [phone], text pages welcome Secure chat group [facility] Teaching Service

## 2023-06-05 NOTE — Progress Notes (Addendum)
[facility] Resident Progress Note  Name: Timothy Hubbard MRN: [id number] DOB: 1991-05-01  Chief Complaint:  Seizure and PEA cardiac arrest  Subjective:  Feeling okay today. Denies breathing difficulty while laying in bed. Gets very worn out transferring from bed to commode.   Intake/Output Summary (Last 24 hours) at 06/05/2023 1143 Last data filed at 06/05/2023 4098 Gross per 24 hour  Intake 762.08 ml  Output 4000 ml  Net -3237.92 ml    Vitals:  Vitals:   06/05/23 0625 06/05/23 0710 06/05/23 0900 06/05/23 1143  BP:      Pulse:      Resp:  20  (!) 21  Temp:  98.3 F (36.8 C)  98.2 F (36.8 C)  TempSrc:  Oral  Oral  SpO2:    95%  Weight: 76.6 kg  76 kg      Physical Exam:  No distress CVC RIJ Heart rate and rhythm normal, strong radial pulses, no appreciable murmurs Breathing comfortably on room air, lungs sound clear Skin warm and dry Abdomen non-tender Leg wraps bilaterally Alert and oriented  Medications reviewed   Labs:     Latest Ref Rng & Units 06/05/2023    4:42 AM 06/04/2023    5:51 AM 06/03/2023    2:09 PM  BMP  Glucose 70 - 99 mg/dL 82  83  83   BUN 6 - 20 mg/dL 37  37  38   Creatinine 0.61 - 1.24 mg/dL 1.19  1.47  8.29   Sodium 135 - 145 mmol/L 140  139  139   Potassium 3.5 - 5.1 mmol/L 3.2  3.2  3.2   Chloride 98 - 111 mmol/L 95  93  91   CO2 22 - 32 mmol/L 31  32  33   Calcium 8.9 - 10.3 mg/dL 8.5  8.4  8.5      Assessment/Plan:   32 year old with seizures and hypertension admitted after seizure and PEA cardiac arrest with hospital course complicated by coagulopathy and oliguric AKI for which nephrology was consulted, condition improving overall.  AKI Recovering. Initially oliguric in setting of multiple PEA arrest. Serologies for GN negative. Complement levels marginally depressed. Note persistent hematuria with RBC and hypertension in outpatient setting. Will add ANCA for completion today, but acknowledge that most likely  etiology is ATN from cardiac arrest and shock. No indication for HD. Avoid nephrotoxic medications including NSAIDs and iodinated intravenous contrast exposure unless the latter is absolutely indicated.  Preferred narcotic agents for pain control are hydromorphone, fentanyl, and methadone. Morphine should not be used. Avoid Baclofen and avoid oral sodium phosphate and magnesium citrate based laxatives / bowel preps. Continue strict Input and Output monitoring.   Hypervolemia Improving. In setting of appropriate IV fluid resuscitation for PEA cardiac arrest and shock with concomitant oliguric AKI. Still hypervolemic. Good UOP with furosemide. Continue bolus dosing, 80 mg BID.   Hypokalemia Potassium ordered for today based on K of 3.2.  HFrEF In setting of recent arrest. Co-ox ~70%. CVP 7. Heart failure following.   Coagulopathy On heparin for RLE peroneal VTE, attributed to stasis. Had hemoperitoneum earlier requiring massive transfusion, wonder if this was sequelae of good CPR. Was thrombocytopenic, platelet nadir of 19, now recovered. TTP/HUS and DIC workup negative. Overall, this was attributed to acute liver injury in setting of shock.   Seizures Neurology following, on levetiracetam.  Hypertension Hydralazine 37.5 mg q 8 hours, Imdur 30 mg daily.  Final recommendations pending attending attestation.  [name] MD 06/05/2023, 11:43 AM    Seen and examined independently.  Agree with note and exam as documented above by resident physician Dr. [name] and as noted here.  General adult male in bed in no acute distress HEENT normocephalic atraumatic extraocular movements intact sclera anicteric Neck supple trachea midline Lungs clear to auscultation bilaterally; on room air; increased work of breathing with exertion  Heart S1S2 no rub Abdomen soft nontender nondistended Extremities no pitting edema; legs are wrapped Psych normal mood and affect  AKI  - Secondary to ATN in  the setting of arrest - Improving   Hypokalemia - replete with additional 40 meq K (ordered)  Microscopic hematuria  - note that foley placement may explain  -reviewed work-up.  Sent ANCA for completion noting complements   HFrEF  - anticipate reducing lasix as early as tomorrow.  Continue current regimen for now - short of breath with movement today    Estanislado Emms, MD 06/05/2023  1:37 PM

## 2023-06-05 NOTE — Care Management Important Message (Signed)
Important Message  Patient Details  Name: Timothy Hubbard MRN: [id number] Date of Birth: Mar 23, 1991   Important Message Given:  Yes - Medicare IM     Sherilyn Banker 06/05/2023, 12:41 PM

## 2023-06-05 NOTE — Assessment & Plan Note (Addendum)
On Heparin drip (per pharm consult). Per Heme/Onc, will need anticoag for 6 months. Per Heme/Onc, transitioning to Eliquis for VTE treatment today and evaluate him for possibly a lower dose for discharge. - Eliquis 5 mg BID dosing - AM CBC

## 2023-06-05 NOTE — Assessment & Plan Note (Signed)
2/2 cardiac arrest. Transaminases steadily downtrending. Abm exam benign.  - monitor daily CMP

## 2023-06-05 NOTE — Assessment & Plan Note (Addendum)
S/p PEA arrest 9/19 and 9/21, now extubated, off pressors and transitioned out of ICU. FULL code.  What - Cardiology consulted, appreciate recs

## 2023-06-06 ENCOUNTER — Other Ambulatory Visit (HOSPITAL_COMMUNITY): Payer: Self-pay

## 2023-06-06 DIAGNOSIS — R569 Unspecified convulsions: Secondary | ICD-10-CM | POA: Diagnosis not present

## 2023-06-06 DIAGNOSIS — I10 Essential (primary) hypertension: Secondary | ICD-10-CM

## 2023-06-06 DIAGNOSIS — E876 Hypokalemia: Secondary | ICD-10-CM | POA: Diagnosis not present

## 2023-06-06 DIAGNOSIS — I469 Cardiac arrest, cause unspecified: Secondary | ICD-10-CM | POA: Diagnosis not present

## 2023-06-06 LAB — COMPREHENSIVE METABOLIC PANEL
ALT: 61 U/L — ABNORMAL HIGH (ref 0–44)
AST: 35 U/L (ref 15–41)
Albumin: 2.6 g/dL — ABNORMAL LOW (ref 3.5–5.0)
Alkaline Phosphatase: 70 U/L (ref 38–126)
Anion gap: 15 (ref 5–15)
BUN: 31 mg/dL — ABNORMAL HIGH (ref 6–20)
CO2: 28 mmol/L (ref 22–32)
Calcium: 8.7 mg/dL — ABNORMAL LOW (ref 8.9–10.3)
Chloride: 99 mmol/L (ref 98–111)
Creatinine, Ser: 2.69 mg/dL — ABNORMAL HIGH (ref 0.61–1.24)
GFR, Estimated: 31 mL/min — ABNORMAL LOW (ref >60)
Glucose, Bld: 79 mg/dL (ref 70–99)
Potassium: 3.1 mmol/L — ABNORMAL LOW (ref 3.5–5.1)
Sodium: 142 mmol/L (ref 135–145)
Total Bilirubin: 1.7 mg/dL — ABNORMAL HIGH (ref 0.3–1.2)
Total Protein: 5.3 g/dL — ABNORMAL LOW (ref 6.5–8.1)

## 2023-06-06 LAB — CBC
HCT: 34.4 % — ABNORMAL LOW (ref 39.0–52.0)
Hemoglobin: 11.5 g/dL — ABNORMAL LOW (ref 13.0–17.0)
MCH: 29.1 pg (ref 26.0–34.0)
MCHC: 33.4 g/dL (ref 30.0–36.0)
MCV: 87.1 fL (ref 80.0–100.0)
Platelets: 213 10*3/uL (ref 150–400)
RBC: 3.95 MIL/uL — ABNORMAL LOW (ref 4.22–5.81)
RDW: 19.1 % — ABNORMAL HIGH (ref 11.5–15.5)
WBC: 10.2 10*3/uL (ref 4.0–10.5)
nRBC: 0 % (ref 0.0–0.2)

## 2023-06-06 LAB — GLUCOSE, CAPILLARY
Glucose-Capillary: 76 mg/dL (ref 70–99)
Glucose-Capillary: 81 mg/dL (ref 70–99)

## 2023-06-06 MED ORDER — HYDRALAZINE HCL 50 MG PO TABS
50.0000 mg | ORAL_TABLET | Freq: Three times a day (TID) | ORAL | Status: DC
  Administered 2023-06-06 – 2023-06-07 (×2): 50 mg via ORAL
  Filled 2023-06-06 (×3): qty 1

## 2023-06-06 MED ORDER — PANTOPRAZOLE SODIUM 40 MG PO TBEC
40.0000 mg | DELAYED_RELEASE_TABLET | Freq: Two times a day (BID) | ORAL | Status: DC
  Administered 2023-06-07: 40 mg via ORAL
  Filled 2023-06-06 (×3): qty 1

## 2023-06-06 MED ORDER — CARMEX CLASSIC LIP BALM EX OINT: TOPICAL_OINTMENT | CUTANEOUS | Status: DC | PRN

## 2023-06-06 MED ORDER — POTASSIUM CHLORIDE CRYS ER 20 MEQ PO TBCR
40.0000 meq | EXTENDED_RELEASE_TABLET | ORAL | Status: AC
  Administered 2023-06-06: 40 meq via ORAL
  Filled 2023-06-06: qty 2

## 2023-06-06 MED ORDER — POTASSIUM CHLORIDE CRYS ER 20 MEQ PO TBCR: 40.0000 meq | EXTENDED_RELEASE_TABLET | Freq: Once | ORAL | Status: DC

## 2023-06-06 MED ORDER — POTASSIUM CHLORIDE 10 MEQ/100ML IV SOLN
10.0000 meq | INTRAVENOUS | Status: AC
  Administered 2023-06-06 (×4): 10 meq via INTRAVENOUS
  Filled 2023-06-06 (×4): qty 100

## 2023-06-06 NOTE — Assessment & Plan Note (Signed)
On Heparin drip (per pharm consult). Per Heme/Onc, will need anticoag for 6 months. Per Heme/Onc, transitioning to Eliquis for VTE treatment today and evaluate him for possibly a lower dose for discharge. - Eliquis 5 mg BID dosing - AM CBC

## 2023-06-06 NOTE — Assessment & Plan Note (Signed)
On 40 meq supplementation daily. Persistently 3.2 here. - Cardiology ordered repletion with today; may need BID dosing in future - recheck K in AM

## 2023-06-06 NOTE — Progress Notes (Signed)
Chambliss MD in the room and notified of the patient dressing has been bleeding and that the on call MD was notified. He states he would like to get the patient an IV and will put the consult in. At this time, patient notified to be careful with transfer and not to touch the site.

## 2023-06-06 NOTE — Assessment & Plan Note (Addendum)
S/p 4 units PRBC, 4 units FFP and 2 units platelets and 1 unit of cryo. Hgb 11.5, plt 213. No evidence of active bleed.  - Heme/Onc consulted, recommend continuing Eliquis for 6 months.

## 2023-06-06 NOTE — Assessment & Plan Note (Addendum)
-   Cont Hydralazine 50mg  q8h - Cont Lasix drip as above - Holding home amlodipine, lisinopril

## 2023-06-06 NOTE — Plan of Care (Signed)
  Problem: Education: Goal: Ability to manage disease process will improve Outcome: Progressing   Problem: Cardiac: Goal: Ability to achieve and maintain adequate cardiopulmonary perfusion will improve Outcome: Progressing   Problem: Neurologic: Goal: Promote progressive neurologic recovery Outcome: Progressing   Problem: Skin Integrity: Goal: Risk for impaired skin integrity will be minimized. Outcome: Progressing   Problem: Education: Goal: Knowledge of General Education information will improve Description: Including pain rating scale, medication(s)/side effects and non-pharmacologic comfort measures Outcome: Progressing   Problem: Health Behavior/Discharge Planning: Goal: Ability to manage health-related needs will improve Outcome: Progressing   Problem: Clinical Measurements: Goal: Ability to maintain clinical measurements within normal limits will improve Outcome: Progressing Goal: Will remain free from infection Outcome: Progressing Goal: Diagnostic test results will improve Outcome: Progressing Goal: Respiratory complications will improve Outcome: Progressing Goal: Cardiovascular complication will be avoided Outcome: Progressing   Problem: Activity: Goal: Risk for activity intolerance will decrease Outcome: Progressing   Problem: Nutrition: Goal: Adequate nutrition will be maintained Outcome: Progressing   Problem: Coping: Goal: Level of anxiety will decrease Outcome: Progressing   Problem: Elimination: Goal: Will not experience complications related to bowel motility Outcome: Progressing Goal: Will not experience complications related to urinary retention Outcome: Progressing   Problem: Pain Managment: Goal: General experience of comfort will improve Outcome: Progressing   Problem: Safety: Goal: Ability to remain free from injury will improve Outcome: Progressing   Problem: Skin Integrity: Goal: Risk for impaired skin integrity will  decrease Outcome: Progressing   Problem: Education: Goal: Ability to describe self-care measures that may prevent or decrease complications (Diabetes Survival Skills Education) will improve Outcome: Progressing Goal: Individualized Educational Video(s) Outcome: Progressing   Problem: Coping: Goal: Ability to adjust to condition or change in health will improve Outcome: Progressing   Problem: Fluid Volume: Goal: Ability to maintain a balanced intake and output will improve Outcome: Progressing   Problem: Health Behavior/Discharge Planning: Goal: Ability to identify and utilize available resources and services will improve Outcome: Progressing Goal: Ability to manage health-related needs will improve Outcome: Progressing   Problem: Metabolic: Goal: Ability to maintain appropriate glucose levels will improve Outcome: Progressing   Problem: Nutritional: Goal: Maintenance of adequate nutrition will improve Outcome: Progressing Goal: Progress toward achieving an optimal weight will improve Outcome: Progressing   Problem: Skin Integrity: Goal: Risk for impaired skin integrity will decrease Outcome: Progressing   Problem: Tissue Perfusion: Goal: Adequacy of tissue perfusion will improve Outcome: Progressing

## 2023-06-06 NOTE — Progress Notes (Signed)
Patient refused night time meds. Stated he would take them in morning. This RN attempted multiple times to educate patient on keeping on schedule with his meds and which meds were to be administered along with their importance. Patient continued to state he would take them in the morning and didn't want to be bothered, becoming slightly more irritable. Asked patient if he would be willing to take even just one pill most important to his care, and patient refused.

## 2023-06-06 NOTE — Assessment & Plan Note (Signed)
-   Cont pulmicort and brovana - Duonebs q4h prn

## 2023-06-06 NOTE — Assessment & Plan Note (Addendum)
Likely multifactoral including CHF exacerbation, fluid overload, R pleural effusion, hx of asthma, atelectasis 2/2 rib pain from CPR.  Baseline mentation. - Currently on room air  - Diurese discussed below - Tylenol prn for rib pain.  -Consider further pain management as needed to prevent atelectasis.  - Tx asthma as below - Incentive spirometry + pulm toilet as indicated - PT OT for deconditioning

## 2023-06-06 NOTE — Assessment & Plan Note (Addendum)
New HFrEF (EF 20-25%), likely 2/2 cardiac arrest. Repeat echo on 05/26/2023 shows recovered EF 55 to 60%. - Cont Lasix drip for fluid overload  - Cont imdur 30mg  daily - Cardiology consulted, appreciate recs

## 2023-06-06 NOTE — Assessment & Plan Note (Addendum)
Thought to be ATN 2/2 cardiac arrest. Cr as high as 4.6; today 2.69. - Cont Lasix drip 80mg  bid - Nephrology consulted, appreciate recs - Strict I/O's - Daily weights - Avoid nephrotoxic drugs - Preferred narcotics for pain control will be hydromorphone, fentanyl, and methadone  - f/u ANCA analysis for possible AI vasculitides

## 2023-06-06 NOTE — Plan of Care (Signed)
  Problem: Education: Goal: Ability to manage disease process will improve Outcome: Progressing   Problem: Cardiac: Goal: Ability to achieve and maintain adequate cardiopulmonary perfusion will improve Outcome: Progressing   Problem: Neurologic: Goal: Promote progressive neurologic recovery Outcome: Progressing

## 2023-06-06 NOTE — Assessment & Plan Note (Addendum)
2/2 cardiac arrest. Transaminases steadily downtrending. Abm exam benign. AM CMP showed ALT 61, AST normal

## 2023-06-06 NOTE — TOC Benefit Eligibility Note (Signed)
Patient Product/process development scientist completed.    The patient is insured through  [employer] . Patient has Medicare and is not eligible for a copay card, but may be able to apply for patient assistance, if available.    Ran test claim for Eliquis 5 mg and the current 30 day co-pay is $4.60.  Ran test claim for Xarelto 20 mg and the current 30 day co-pay is $4.60.   This test claim was processed through [facility]- copay amounts may vary at other pharmacies due to pharmacy/plan contracts, or as the patient moves through the different stages of their insurance plan.     Roland Earl, CPHT Pharmacy Technician III Certified Patient Advocate [facility] Patient Advocate Team Direct Number: [phone]  Fax: [phone]

## 2023-06-06 NOTE — Progress Notes (Signed)
Daily Progress Note Intern Pager: [phone]  Patient name: Timothy Hubbard Medical record number: [id number] Date of birth: [birth year] Age: 32 y.o. Gender: male  Primary Care Provider: Vonna Drafts, MD Consultants: Cardiology/Heart Failure, Nephrology, Oncology (signed off 10/1) Code Status: FULL  Pt Overview and Major Events to Date:  9/19 2 PEA arrests ROSC achieved, admit to ICU; seizure and cardiac arrest 9/20 extubated 05/27/2023: PEA arrest and after woke up and was confused.  Total of 2 rounds of CPR with epi and bicarb x 1.  But became bradycardic > intubated.  Had A-line placed.  Also central line ascites tap did show blood.  Was given 4 units PRBC, 4 units FFP and 2 units platelets and 1 unit of cryo major transfusion protocol Cc consult for hemoperitoneum.  DIC panel positive.  He had some oozing from mouth and IV sites EEG showed symmetric low voltage no/seizure activity 06/01/2023: Extubated 06/03/2023: Transferred to FMTS 06/05/2023: Transitioning to peripheral IV, switching heparin to oral anticoagulant (eliquis)   Assessment and Plan: Timothy Hubbard. Timothy Hubbard is a 32 year old male with PMH of seizures, hypertension and asthma who presented after witnessed seizure with PEA arrest x 2 who was intubated and transferred to ICU 9/19 and out to FMTS on 9/28.  Assessment & Plan Acute HFrEF (heart failure with reduced ejection fraction) (HCC) New HFrEF (EF 20-25%), likely 2/2 cardiac arrest. Repeat echo on 05/26/2023 shows recovered EF 55 to 60%. - Cont Lasix drip for fluid overload  - Cont imdur 30mg  daily - Cardiology consulted, appreciate recs Acute respiratory failure with hypercapnia (HCC) Likely multifactoral including CHF exacerbation, fluid overload, R pleural effusion, hx of asthma, atelectasis 2/2 rib pain from CPR.  Baseline mentation. - Currently on room air  - Diurese discussed below - Tylenol prn for rib pain.  -Consider further pain management as needed to prevent  atelectasis.  - Tx asthma as below - Incentive spirometry + pulm toilet as indicated - PT OT for deconditioning Acute deep vein thrombosis (DVT) of right peroneal vein (HCC) On Heparin drip (per pharm consult). Per Heme/Onc, will need anticoag for 6 months. Per Heme/Onc, transitioning to Eliquis for VTE treatment today and evaluate him for possibly a lower dose for discharge. - Eliquis 5 mg BID dosing - AM CBC AKI (acute kidney injury) (HCC) Thought to be ATN 2/2 cardiac arrest. Cr as high as 4.6; today 2.69. - Cont Lasix drip 80mg  bid - Nephrology consulted, appreciate recs - Strict I/O's - Daily weights - Avoid nephrotoxic drugs - Preferred narcotics for pain control will be hydromorphone, fentanyl, and methadone  - f/u ANCA analysis for possible AI vasculitides  Cardiac arrest (HCC) S/p PEA arrest 9/19 and 9/21, now extubated, off pressors and transitioned out of ICU. - Cardiology consulted, appreciate recs Seizure disorder (HCC) - Cont Keppra 500mg  BID - Seizure precautions - Neurology consulted, appreciate recs.  Hypertension - Cont Hydralazine 50mg  q8h - Cont Lasix drip as above - Holding home amlodipine, lisinopril  Asthma - Cont pulmicort and brovana - Duonebs q4h prn Pleural effusion, right Likely 2/2 fluid overload. - Lasix drip as above Coagulopathy (HCC) S/p 4 units PRBC, 4 units FFP and 2 units platelets and 1 unit of cryo. Hgb 11.5, plt 213. No evidence of active bleed.  - Heme/Onc consulted, recommend continuing Eliquis for 6 months. Shock liver 2/2 cardiac arrest. Transaminases steadily downtrending. Abm exam benign. AM CMP showed ALT 61, AST normal Hypokalemia On 40 meq supplementation daily. Persistently 3.2 here. -  Cardiology ordered repletion with today; may need BID dosing in future - recheck K in AM   Chronic and Stable Problems:  Mood disorder: olanzapine 5 mg nightly     FEN/GI: DYS3, Boost.  PPx: Eliquis Dispo: SNF per PT/OT  recommendations.  Awaiting placement.  Subjective:  Pt is energetic and pleasant on exam today. States he is doing well, denies pain. Has been up walking around and ambulates well. No complaints at this time.  Objective: Temp:  [98.3 F (36.8 C)-98.8 F (37.1 C)] 98.6 F (37 C) (10/01 0901) Pulse Rate:  [92-98] 93 (10/01 0901) Resp:  [15-18] 15 (10/01 0901) BP: (165-174)/(101-107) 165/101 (10/01 0400) SpO2:  [95 %-96 %] 95 % (10/01 0901) Weight:  [75.9 kg] 75.9 kg (10/01 0701) Physical Exam: General: Well-appearing, pleasant, NAD. Cardiovascular: RRR, no murmurs Respiratory: CTA bilaterally, no wheezing.  On room air. Abdomen: Normoactive bowel sounds, soft, nontender, nondistended. Extremities: Skin is warm and dry.  Moves all extremities equally.  All nontender to palpation.  Laboratory: Most recent CBC Lab Results  Component Value Date   WBC 10.2 06/06/2023   HGB 11.5 (L) 06/06/2023   HCT 34.4 (L) 06/06/2023   MCV 87.1 06/06/2023   PLT 213 06/06/2023   Most recent BMP    Latest Ref Rng & Units 06/06/2023    4:58 AM  BMP  Glucose 70 - 99 mg/dL 79   BUN 6 - 20 mg/dL 31   Creatinine 1.61 - 1.24 mg/dL 0.96   Sodium 045 - 409 mmol/L 142   Potassium 3.5 - 5.1 mmol/L 3.1   Chloride 98 - 111 mmol/L 99   CO2 22 - 32 mmol/L 28   Calcium 8.9 - 10.3 mg/dL 8.7      [name], DO 06/06/2023, 2:05 PM  PGY-1, [facility] FPTS Intern pager: [phone], text pages welcome Secure chat group [facility] Teaching Service

## 2023-06-06 NOTE — Progress Notes (Addendum)
Patient Name: Timothy Hubbard Date of Encounter: 06/06/2023 [facility] Cardiologist: None   Interval Summary  .    Progressing appropriately.  No complaints.  Liver function and renal function continues to improve.  General cardiology now resuming care from advanced heart failure.  Vital Signs .    Vitals:   06/05/23 1143 06/05/23 1327 06/05/23 2028 06/06/23 0400  BP:   (!) 174/107 (!) 165/101  Pulse: 83  92 98  Resp: (!) 21 20 18 18   Temp: 98.2 F (36.8 C) 98.1 F (36.7 C) 98.3 F (36.8 C) 98.8 F (37.1 C)  TempSrc: Oral Oral Oral Oral  SpO2: 95%  95% 96%  Weight:        Intake/Output Summary (Last 24 hours) at 06/06/2023 0745 Last data filed at 06/05/2023 1814 Gross per 24 hour  Intake --  Output 1500 ml  Net -1500 ml      06/05/2023    9:00 AM 06/05/2023    6:25 AM 06/04/2023    6:06 AM  Last 3 Weights  Weight (lbs) 167 lb 8 oz 168 lb 14.4 oz 186 lb 1.1 oz  Weight (kg) 75.978 kg 76.613 kg 84.4 kg      Telemetry/ECG    Generally normal sinus rhythm with heart rates in the 80s and 90s.  A little bit tachycardic now with episodes that appear to be MAT.  Heart rates 100-120.- Personally Reviewed  CV Studies    Echocardiogram 06/03/2023 1. Left ventricular ejection fraction, by estimation, is 55 to 60%. The  left ventricle has normal function. The left ventricle has no regional  wall motion abnormalities. Left ventricular diastolic parameters were  normal.   2. Right ventricular systolic function is normal. The right ventricular  size is normal. There is mildly elevated pulmonary artery systolic  pressure. The estimated right ventricular systolic pressure is 42.5 mmHg.   3. Large pleural effusion in the left lateral region.   4. The mitral valve is normal in structure. Trivial mitral valve  regurgitation. No evidence of mitral stenosis.   5. The aortic valve has an indeterminant number of cusps. Aortic valve  regurgitation is not visualized. No aortic  stenosis is present.   6. The inferior vena cava is dilated in size with <50% respiratory  variability, suggesting right atrial pressure of 15 mmHg.   Comparison(s): Changes from prior study are noted. LVEF improved from 25%  to normal now.   Limited echocardiogram 05/28/2023 1. Limited study.   2. Left ventricular ejection fraction, by estimation, is 20 to 25%. The  left ventricle has severely decreased function. The left ventricle  demonstrates global hypokinesis.   3. RV-RA gradient 21 mmHg suggesting normal to mildly increased estimated  RVSP depending on CVP. Right ventricular systolic function is severely  reduced. The right ventricular size is moderately enlarged.   4. The mitral valve is grossly normal. Mild mitral valve regurgitation.   5. The tricuspid valve is abnormal. Tricuspid valve regurgitation is  moderate to severe.   6. The aortic valve is tricuspid.   7. Unable to estimate CVP.   Comparison(s): Prior images reviewed side by side. LVEF 20-25% with global  hypokinesis and decrease compared with prior study.   Echocardiogram 05/26/2023 1. Left ventricular ejection fraction, by estimation, is 60 to 65%. The  left ventricle has normal function. The left ventricle has no regional  wall motion abnormalities. Left ventricular diastolic parameters were  normal.   2. Right ventricular systolic function is  normal. The right ventricular  size is normal.   3. The mitral valve is normal in structure. Trivial mitral valve  regurgitation. No evidence of mitral stenosis.   4. The aortic valve is tricuspid. Aortic valve regurgitation is not  visualized. No aortic stenosis is present.   5. The inferior vena cava is normal in size with greater than 50%  respiratory variability, suggesting right atrial pressure of 3 mmHg.   Physical Exam .   GEN: No acute distress.   Neck: + JVD Cardiac: RRR, no murmurs, rubs, or gallops.  Respiratory: Diminished GI: Soft, nontender,  non-distended  MS: Upper thigh edema  Patient Profile    Timothy Hubbard is a 32 y.o. male has hx of seizures, hypertension, asthma and admitted on 05/25/2023.  Patient here with PEA arrest x 2 status post CPR with ROSC admitted to the ICU intubated.  Postextubation had another PEA arrest 05/27/2023 with 2 rounds of CPR.  Echocardiogram showed reduced EF 20 to 25%.  Had normalization of EF 06/03/2023.  Course has been complicated by shock liver, DIC AKI, seizure.  Now has transitioned to general cardiology services.   Assessment & Plan .     Acute HFrEF Stress cardiomyopathy PEA arrest in the setting of seizures Echocardiogram 05/26/2023 normal EF 60 to 65% after first PEA arrest.  Second PEA arrest occurred on 05/26/2023 with EF 25%, severely reduced RV function.  Repeat echo on 05/26/2023 shows recovered EF 55 to 60%.  Appears to be progressing appropriately with stable CVP 8 yesterday. Still remains to be hypervolemic with JVD and thigh edema, no SOB.  I's and O's have not been documented accurately. Continue hydralazine 37.5 mg every 8, Imdur 30 mg.  Titrate GDMT once renal function stabilizes more.  Hydralazine was just increased yesterday.  Will defer titration today. Consider initiating BB after diureses and once more stable.  Continue IV Lasix 80 mg twice daily. Still has central line in.  Will discuss with MD if we need further monitoring of CVP.  If not we will need to discontinue order.  AKI Likely ATN related to postarrest.  Previously normal renal function PTA.  Creatinine as high as 4.6.  Now downtrending appropriately.  Today 2.69.  Nephrology following.  Hypokalemia Persistently has been around 3.1 despite being on PO supplementation and 40 meq daily. Will give 2 doses of today. May need PO BID. Will recheck K tomorrow.   Shock liver Related to postarrest.  LFTs continue to improve.  RLE DVT Has been transition to Eliquis now.  Abuse/alcohol abuse Agreeable to  cessation.  DIC Required MTP.  Suspected secondary to shock liver.  Hematology following.  Seems stable now with no further bleeding.  For questions or updates, please contact [facility] Please consult [email or web address] for contact info under        Signed, Abagail Kitchens, PA-C

## 2023-06-06 NOTE — Plan of Care (Signed)
  Problem: Education: Goal: Ability to manage disease process will improve 06/06/2023 0212 by Dahlia Bailiff, RN Outcome: Progressing 06/06/2023 0211 by Dahlia Bailiff, RN Outcome: Progressing   Problem: Cardiac: Goal: Ability to achieve and maintain adequate cardiopulmonary perfusion will improve 06/06/2023 0212 by Dahlia Bailiff, RN Outcome: Progressing 06/06/2023 0211 by Dahlia Bailiff, RN Outcome: Progressing   Problem: Neurologic: Goal: Promote progressive neurologic recovery 06/06/2023 0212 by Dahlia Bailiff, RN Outcome: Progressing 06/06/2023 0211 by Dahlia Bailiff, RN Outcome: Progressing

## 2023-06-06 NOTE — Progress Notes (Signed)
PT Cancellation Note  Patient Details Name: Timothy Hubbard MRN: [id number] DOB: [birth year]   Cancelled Treatment:    Reason Eval/Treat Not Completed: Other (comment). Pt reports he already ambulated in hallway. Up amb in room unassisted. Will try again later.   Angelina Ok [facility] 06/06/2023, 9:48 AM Skip Mayer PT Acute [employer] [phone]

## 2023-06-06 NOTE — Progress Notes (Signed)
Physical Therapy Treatment Patient Details Name: Timothy Hubbard MRN: [id number] DOB: 16-Feb-1991 Today's Date: 06/06/2023   History of Present Illness 32 y.o. male admitted 9/19 for seizures complicated by PEA cardiac arrest x 2, approximately 3 minutes total downtime. Pt with shock of mixed etiology, cardiogenic and septic due to aspiration PNA. Intubation 9/19-9/20, 9/21 3rd brief PEA arrest and re intubated 9/21-9/26. Course complicated by oliguric AKI, hemoperitoneum and acute anemia in setting of DIC, DVT in RLE. PMhx: seizures, HTN, asthma, mood disorder, substance abuse    PT Comments  Pt with much improved mobility since last treatment. Pt amb on his own in his room without assistive device. Amb in hallway with rolling walker. Updated dc recommendation to home with HHPT.     If plan is discharge home, recommend the following: Assistance with cooking/housework;Direct supervision/assist for medications management;Direct supervision/assist for financial management;Assist for transportation;Help with stairs or ramp for entrance   Can travel by private vehicle        Equipment Recommendations  None recommended by PT    Recommendations for Other Services       Precautions / Restrictions Precautions Precautions: Other (comment) Precaution Comments: impaired cognition Restrictions Weight Bearing Restrictions: No     Mobility  Bed Mobility Overal bed mobility: Needs Assistance Bed Mobility: Sit to Supine       Sit to supine: Min assist   General bed mobility comments: Assist to clear legs back into bed    Transfers Overall transfer level: Modified independent Equipment used: None Transfers: Sit to/from Stand Sit to Stand: Modified independent (Device/Increase time)           General transfer comment: Pt able to rise unassisted.    Ambulation/Gait Ambulation/Gait assistance: Supervision Gait Distance (Feet): 325 Feet Assistive device: Rolling walker (2 wheels),  None Gait Pattern/deviations: Step-through pattern, Decreased stride length Gait velocity: decr Gait velocity interpretation: 1.31 - 2.62 ft/sec, indicative of limited community ambulator   General Gait Details: Assist for safety and lines. Amb in room without assistive device   Stairs             Wheelchair Mobility     Tilt Bed    Modified Rankin (Stroke Patients Only)       Balance Overall balance assessment: Needs assistance Sitting-balance support: No upper extremity supported, Feet supported Sitting balance-Leahy Scale: Normal     Standing balance support: No upper extremity supported, During functional activity Standing balance-Leahy Scale: Good                              Cognition Arousal: Alert Behavior During Therapy: Flat affect Overall Cognitive Status: Impaired/Different from baseline Area of Impairment: Attention, Memory, Following commands, Safety/judgement, Awareness, Problem solving                   Current Attention Level: Selective Memory: Decreased short-term memory Following Commands: Follows one step commands consistently Safety/Judgement: Decreased awareness of deficits Awareness: Emergent Problem Solving: Slow processing, Requires verbal cues          Exercises      General Comments General comments (skin integrity, edema, etc.): VSS on RA      Pertinent Vitals/Pain Pain Assessment Pain Assessment: No/denies pain    Home Living                          Prior Function  PT Goals (current goals can now be found in the care plan section) Progress towards PT goals: Progressing toward goals    Frequency    Min 1X/week      PT Plan      Co-evaluation              AM-PAC PT "6 Clicks" Mobility   Outcome Measure  Help needed turning from your back to your side while in a flat bed without using bedrails?: None Help needed moving from lying on your back to sitting on  the side of a flat bed without using bedrails?: A Little Help needed moving to and from a bed to a chair (including a wheelchair)?: A Little Help needed standing up from a chair using your arms (e.g., wheelchair or bedside chair)?: None Help needed to walk in hospital room?: A Little Help needed climbing 3-5 steps with a railing? : A Little 6 Click Score: 20    End of Session   Activity Tolerance: Patient tolerated treatment well Patient left: in bed;with bed alarm set Nurse Communication: Mobility status PT Visit Diagnosis: Other abnormalities of gait and mobility (R26.89);Muscle weakness (generalized) (M62.81)     Time: 1610-9604 PT Time Calculation (min) (ACUTE ONLY): 14 min  Charges:    $Gait Training: 8-22 mins PT General Charges $$ ACUTE PT VISIT: 1 Visit                     [facility] PT Acute Rehabilitation Services Office [phone]    Angelina Ok [facility] 06/06/2023, 3:36 PM

## 2023-06-06 NOTE — Assessment & Plan Note (Addendum)
S/p PEA arrest 9/19 and 9/21, now extubated, off pressors and transitioned out of ICU.  - Cardiology consulted, appreciate recs

## 2023-06-06 NOTE — Progress Notes (Signed)
Timothy Hubbard is on 6 E.  He is making some good progress.  His liver function continues to improve.  His renal function also seems to be getting a little bit better.  He has now is on Eliquis.  I think this is very reasonable for him.  I would think that he would need 6 months of anticoagulation.  His labs show white cell count 10.2.  Hemoglobin 11.5.  Platelet count 213,000.  There is no c-Met back yet.  He is out of bed a little bit.  He is eating a little bit better.  There is no obvious infection.  He has had no obvious bleeding.  I know he had a hematuria in the past.  His platelet count is back to normal.  Again everything, in my line, his reflective of the "shock liver" that he had from this cardiac arrest.  Now that his liver is improving, his coagulation parameters are also improving.  At this point, we will just sign off. I myself that we need to do for him.  His hemoglobin is come up quite nicely.  He has received incredibly amazing care for such a complicated process.  I am not surprised by everybody who helped him and the dedication that they showed to getting him better.   [name], MD  Psalm 41:3

## 2023-06-06 NOTE — TOC Initial Note (Signed)
Transition of Care [facility]) - Initial/Assessment Note    Patient Details  Name: Timothy Hubbard MRN: [id number] Date of Birth: 1990/11/15  Transition of Care [facility]) CM/SW Contact:    Reva Bores, LCSWA Phone Number:[phone] 06/06/2023, 1:27 PM  Clinical Narrative: HF CSW met with pt, pts mother , and aunt at bedside. Pt lives at home with mother and sister. Pts mother stated that his sister has a disability as well.  Pts mother stated that he uses [facility] services 5 days a week. Pts mother stated that they have a scale at home. CSW asked the family if they were familiar with SNF and if they had any particular agencies in mind if they were to be recommended. Pts mother stated that she wants to continue using [facility] services. Pts mother stated that if the official recommendation is for SNF then that is a conversation that she needs to be in the loop about. Pts mother stated that no decisions needs to be made without her consent. CSW stated that she will let the HF team know.   TOC will contiue following.                    Expected Discharge Plan: Home w Home Health Services (primary health choice 2hrs daily) Barriers to Discharge: Continued Medical Work up   Patient Goals and CMS Choice            Expected Discharge Plan and Services       Living arrangements for the past 2 months: Single Family Home                                      Prior Living Arrangements/Services Living arrangements for the past 2 months: Single Family Home Lives with:: Parents, Siblings Patient language and need for interpreter reviewed:: Yes Do you feel safe going back to the place where you live?: Yes      Need for Family Participation in Patient Care: Yes (Comment) Care giver support system in place?: Yes (comment)   Criminal Activity/Legal Involvement Pertinent to Current Situation/Hospitalization: No - Comment as needed  Activities of Daily Living      Permission Sought/Granted                   Emotional Assessment Appearance:: Appears older than stated age Attitude/Demeanor/Rapport: Engaged Affect (typically observed): Appropriate Orientation: : Oriented to Self, Oriented to Place, Oriented to  Time, Oriented to Situation Alcohol / Substance Use: Not Applicable Psych Involvement: No (comment)  Admission diagnosis:  Cardiac arrest (HCC) [I46.9] Lactic acidosis [E87.20] Seizure (HCC) [R56.9] Patient Active Problem List   Diagnosis Date Noted   Acute deep vein thrombosis (DVT) of right peroneal vein (HCC) 06/03/2023   AKI (acute kidney injury) (HCC) 06/03/2023   Acute HFrEF (heart failure with reduced ejection fraction) (HCC) 06/03/2023   Pleural effusion, right 06/03/2023   Coagulopathy (HCC) 06/03/2023   Shock liver 06/03/2023   Lactic acidosis 05/26/2023   Cardiac arrest (HCC) 05/25/2023   Acute respiratory failure with hypercapnia (HCC) 05/25/2023   Back pain 05/22/2023   Abnormal weight loss 05/22/2023   Hypokalemia 04/22/2023   Prolonged QT interval 04/22/2023   Oral candidiasis 04/22/2023   Bilateral lower extremity edema 02/14/2023   Benzodiazepine abuse, 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   History of cocaine abuse (HCC) 12/25/2022   Tachycardia 09/10/2022   Post-nasal drip 07/15/2022   Cognitive impairment  07/15/2022   Impaired instrumental activities of daily living (IADL) 07/15/2022   Ambulatory dysfunction 04/06/2022   Mood disorder (HCC) 03/31/2022   Grade III internal hemorrhoids    Iron deficiency anemia 03/04/2022   Seizure disorder (HCC)    Alcohol use disorder 10/21/2016   Gastroesophageal reflux disease 06/13/2016   Hypertension 04/01/2016   Asthma 04/01/2016   TOBACCO USER 06/13/2009   PCP:  Vonna Drafts, MD Pharmacy:   [facility] [address] - [place], [place] - [address] [facility] [address] [place] [address] Phone: [phone] Fax: [phone]  [facility] - [place], [place] -  [address] [place] [address] Phone: [phone] Fax: [phone]  [id number] - [place], [place] - [address] AT [facility] [address] [place] [address] Phone: [phone] Fax: [phone]     Social Determinants of Health (SDOH) Social History: SDOH Screenings   Food Insecurity: No Food Insecurity (04/23/2023)  Housing: Low Risk  (04/23/2023)  Transportation Needs: No Transportation Needs (04/23/2023)  Utilities: Not At Risk (04/23/2023)  Alcohol Screen: Low Risk  (12/16/2022)  Depression (PHQ2-9): High Risk (05/22/2023)  Financial Resource Strain: Medium Risk (04/01/2022)  Stress: Stress Concern Present (01/05/2023)  Tobacco Use: High Risk (05/25/2023)   SDOH Interventions:     Readmission Risk Interventions     No data to display

## 2023-06-06 NOTE — Assessment & Plan Note (Addendum)
Likely 2/2 fluid overload. - Lasix drip as above

## 2023-06-06 NOTE — Assessment & Plan Note (Signed)
-   Cont Keppra 500mg  BID -Seizure precautions - Neurology consulted, appreciate recs.

## 2023-06-07 ENCOUNTER — Inpatient Hospital Stay (HOSPITAL_COMMUNITY): Payer: Medicare Other

## 2023-06-07 DIAGNOSIS — E876 Hypokalemia: Secondary | ICD-10-CM

## 2023-06-07 DIAGNOSIS — I469 Cardiac arrest, cause unspecified: Secondary | ICD-10-CM | POA: Diagnosis not present

## 2023-06-07 DIAGNOSIS — J90 Pleural effusion, not elsewhere classified: Secondary | ICD-10-CM

## 2023-06-07 DIAGNOSIS — R569 Unspecified convulsions: Secondary | ICD-10-CM | POA: Diagnosis not present

## 2023-06-07 DIAGNOSIS — I82451 Acute embolism and thrombosis of right peroneal vein: Secondary | ICD-10-CM

## 2023-06-07 LAB — RENAL FUNCTION PANEL
Albumin: 2.6 g/dL — ABNORMAL LOW (ref 3.5–5.0)
Anion gap: 14 (ref 5–15)
BUN: 29 mg/dL — ABNORMAL HIGH (ref 6–20)
CO2: 27 mmol/L (ref 22–32)
Calcium: 8.5 mg/dL — ABNORMAL LOW (ref 8.9–10.3)
Chloride: 101 mmol/L (ref 98–111)
Creatinine, Ser: 2.23 mg/dL — ABNORMAL HIGH (ref 0.61–1.24)
GFR, Estimated: 39 mL/min — ABNORMAL LOW (ref >60)
Glucose, Bld: 97 mg/dL (ref 70–99)
Phosphorus: 3 mg/dL (ref 2.5–4.6)
Potassium: 2.9 mmol/L — ABNORMAL LOW (ref 3.5–5.1)
Sodium: 142 mmol/L (ref 135–145)

## 2023-06-07 LAB — BASIC METABOLIC PANEL
Anion gap: 16 — ABNORMAL HIGH (ref 5–15)
BUN: 28 mg/dL — ABNORMAL HIGH (ref 6–20)
CO2: 28 mmol/L (ref 22–32)
Calcium: 8.6 mg/dL — ABNORMAL LOW (ref 8.9–10.3)
Chloride: 99 mmol/L (ref 98–111)
Creatinine, Ser: 2.03 mg/dL — ABNORMAL HIGH (ref 0.61–1.24)
GFR, Estimated: 44 mL/min — ABNORMAL LOW (ref >60)
Glucose, Bld: 87 mg/dL (ref 70–99)
Potassium: 3.3 mmol/L — ABNORMAL LOW (ref 3.5–5.1)
Sodium: 143 mmol/L (ref 135–145)

## 2023-06-07 LAB — ANCA TITERS
Atypical P-ANCA titer: <1:20 {titer}
C-ANCA: <1:20 {titer}
P-ANCA: <1:20 {titer}

## 2023-06-07 LAB — CBC
HCT: 36.1 % — ABNORMAL LOW (ref 39.0–52.0)
Hemoglobin: 12 g/dL — ABNORMAL LOW (ref 13.0–17.0)
MCH: 29.6 pg (ref 26.0–34.0)
MCHC: 33.2 g/dL (ref 30.0–36.0)
MCV: 88.9 fL (ref 80.0–100.0)
Platelets: 217 10*3/uL (ref 150–400)
RBC: 4.06 MIL/uL — ABNORMAL LOW (ref 4.22–5.81)
RDW: 19.5 % — ABNORMAL HIGH (ref 11.5–15.5)
WBC: 8.9 10*3/uL (ref 4.0–10.5)
nRBC: 0 % (ref 0.0–0.2)

## 2023-06-07 LAB — MAGNESIUM
Magnesium: 1.4 mg/dL — ABNORMAL LOW (ref 1.7–2.4)
Magnesium: 1.3 mg/dL — ABNORMAL LOW (ref 1.7–2.4)

## 2023-06-07 LAB — GLUCOSE, CAPILLARY: Glucose-Capillary: 85 mg/dL (ref 70–99)

## 2023-06-07 MED ORDER — MAGNESIUM SULFATE 4 GM/100ML IV SOLN
4.0000 g | Freq: Once | INTRAVENOUS | Status: AC
  Administered 2023-06-07: 4 g via INTRAVENOUS
  Filled 2023-06-07: qty 100

## 2023-06-07 MED ORDER — LORAZEPAM 2 MG/ML IJ SOLN
2.0000 mg | Freq: Once | INTRAMUSCULAR | Status: AC
  Administered 2023-06-07: 2 mg via INTRAVENOUS

## 2023-06-07 MED ORDER — POTASSIUM CHLORIDE CRYS ER 20 MEQ PO TBCR
60.0000 meq | EXTENDED_RELEASE_TABLET | Freq: Once | ORAL | Status: AC
  Administered 2023-06-07: 60 meq via ORAL
  Filled 2023-06-07: qty 3

## 2023-06-07 MED ORDER — ENOXAPARIN SODIUM 80 MG/0.8ML IJ SOSY: 80.0000 mg | PREFILLED_SYRINGE | Freq: Two times a day (BID) | INTRAMUSCULAR | Status: DC

## 2023-06-07 MED ORDER — POTASSIUM CHLORIDE 10 MEQ/100ML IV SOLN
10.0000 meq | INTRAVENOUS | Status: AC
  Administered 2023-06-07 (×6): 10 meq via INTRAVENOUS
  Filled 2023-06-07 (×6): qty 100

## 2023-06-07 MED ORDER — LORAZEPAM 2 MG/ML IJ SOLN
INTRAMUSCULAR | Status: AC
  Filled 2023-06-07: qty 2

## 2023-06-07 MED ORDER — WITCH HAZEL-GLYCERIN EX PADS
MEDICATED_PAD | CUTANEOUS | Status: DC | PRN
  Filled 2023-06-07: qty 100

## 2023-06-07 MED ORDER — POLYETHYLENE GLYCOL 3350 17 G PO PACK
17.0000 g | PACK | Freq: Every day | ORAL | Status: DC
  Filled 2023-06-07 (×6): qty 1

## 2023-06-07 MED ORDER — HYDRALAZINE HCL 50 MG PO TABS: 100.0000 mg | ORAL_TABLET | Freq: Three times a day (TID) | ORAL | Status: DC

## 2023-06-07 MED ORDER — HYDRALAZINE HCL 20 MG/ML IJ SOLN
50.0000 mg | Freq: Three times a day (TID) | INTRAMUSCULAR | Status: DC
  Administered 2023-06-07 – 2023-06-08 (×3): 50 mg via INTRAVENOUS
  Filled 2023-06-07 (×3): qty 3

## 2023-06-07 MED ORDER — PANTOPRAZOLE SODIUM 40 MG IV SOLR
40.0000 mg | Freq: Two times a day (BID) | INTRAVENOUS | Status: DC
Start: 2023-06-07 — End: 2023-06-08
  Administered 2023-06-07 – 2023-06-08 (×2): 40 mg via INTRAVENOUS
  Filled 2023-06-07 (×2): qty 10

## 2023-06-07 MED ORDER — POTASSIUM CHLORIDE CRYS ER 20 MEQ PO TBCR: 40.0000 meq | EXTENDED_RELEASE_TABLET | Freq: Two times a day (BID) | ORAL | Status: DC

## 2023-06-07 MED ORDER — OLANZAPINE 10 MG IM SOLR
5.0000 mg | Freq: Every day | INTRAMUSCULAR | Status: DC
  Administered 2023-06-08 (×2): 5 mg via INTRAMUSCULAR
  Filled 2023-06-07 (×2): qty 10

## 2023-06-07 MED ORDER — LEVETIRACETAM IN NACL 500 MG/100ML IV SOLN
500.0000 mg | Freq: Two times a day (BID) | INTRAVENOUS | Status: DC
  Administered 2023-06-08 (×3): 500 mg via INTRAVENOUS
  Filled 2023-06-07 (×3): qty 100

## 2023-06-07 MED ORDER — ACETAMINOPHEN 10 MG/ML IV SOLN
650.0000 mg | Freq: Four times a day (QID) | INTRAVENOUS | Status: DC | PRN
Start: 2023-06-07 — End: 2023-06-08

## 2023-06-07 MED ORDER — APIXABAN 5 MG PO TABS: 5.0000 mg | ORAL_TABLET | Freq: Two times a day (BID) | ORAL | Status: DC

## 2023-06-07 MED ORDER — LORAZEPAM 2 MG/ML IJ SOLN
INTRAMUSCULAR | Status: AC
  Filled 2023-06-07: qty 1

## 2023-06-07 MED ORDER — MAGNESIUM SULFATE 4 GM/100ML IV SOLN: 2.0000 g | Freq: Once | INTRAVENOUS | Status: DC

## 2023-06-07 MED ORDER — LORAZEPAM 2 MG/ML IJ SOLN: 2.0000 mg | INTRAMUSCULAR | Status: DC | PRN

## 2023-06-07 MED ORDER — PHENYLEPHRINE-MINERAL OIL-PET 0.25-14-74.9 % RE OINT
1.0000 | TOPICAL_OINTMENT | Freq: Two times a day (BID) | RECTAL | Status: DC | PRN
  Filled 2023-06-07: qty 57

## 2023-06-07 MED ORDER — POTASSIUM CHLORIDE 10 MEQ/100ML IV SOLN
10.0000 meq | INTRAVENOUS | Status: AC
  Administered 2023-06-07 (×2): 10 meq via INTRAVENOUS
  Filled 2023-06-07 (×2): qty 100

## 2023-06-07 MED ORDER — LEVETIRACETAM IN NACL 1000 MG/100ML IV SOLN
1000.0000 mg | Freq: Once | INTRAVENOUS | Status: AC
  Administered 2023-06-07: 1000 mg via INTRAVENOUS
  Filled 2023-06-07: qty 100

## 2023-06-07 NOTE — Assessment & Plan Note (Signed)
-   Cont pulmicort and brovana - Duonebs q4h prn

## 2023-06-07 NOTE — Assessment & Plan Note (Addendum)
On 40 meq supplementation daily. 2.9 this AM. - Cardiology ordered repletion with today - will also give BID - recheck K in AM - Cardiology questions possible hyperaldosteronism with persistently low K and elevated BPs. Will consider adding spironolactone once renal fxn improves.

## 2023-06-07 NOTE — Progress Notes (Signed)
Family Medicine Teaching Service Attending Note  I personally interviewed and examined patient Timothy Hubbard and reviewed their tests and x-rays.  I discussed with Dr. Melissa Noon and reviewed their note for today.  I agree with their assessment and plan.     Additionally  I was present during part of RR for tonic clonic seizure.  When checked around 3 PM he was sedated with equal pupils and minimal extremity response bilaterally.  EEG on going HR was in 120s   Questionable focal seizure in LUE then with onset of full seizure and now sedation and tachycardia Concern could this be intracranial event given anticoagulation vs recurrence of chronic seizure do with sedation due to ativan. Elevated HR felt secondary to hydralazine. Will discuss with neurology   Change all medications to parenteral.   Appreciate Rapid Response and neurology and cardiology assistance

## 2023-06-07 NOTE — Progress Notes (Addendum)
FMTS Brief Progress Note  S: Received secure chat message from RN that pt was having rectal bleeding from hemorrhoids and that this is new since yesterday. Went to see pt with Dr Royal Piedra. Pt states he knows he has internal hemorrhoids that were found on a colonoscopy in the past. Denies constipation or rectal pain.   O: BP (!) 185/112 (BP Location: Right Arm)   Pulse 86   Temp 98.5 F (36.9 C) (Oral)   Resp 18   Wt 75.9 kg   SpO2 92%   BMI 27.01 kg/m    Physical exam Gen: young man sitting in chair in hospital room in NAD, conversant and cooperative with exam GI: several 2cm, nontender external hemorrhoids protruding from rectum, minimal blood on glove after palpation  A/P: External hemorrhoids New bleeding hemorrhoids without constipation or pain -scheduled miralax daily to minimize hemorroid irritation -Tucks pads PRN -continue to monitor   Lorayne Bender, MD 06/07/2023, 3:22 AM PGY-1, [place] [facility] Night Resident  Please page [phone] with questions.

## 2023-06-07 NOTE — Progress Notes (Signed)
Paged by RN that patient was having a seizure.  She had also already paged Dr. Melynda Ripple with neurology.  Went to the bedside.  He was laying on his left side having what appeared to be a full body tonic-clonic seizure. He was hemodynamically stable with airway protection.  HR low 100s, SpO2 95% on room air, slightly hypertensive. Per RN, it had been around 4 minutes since seizure started.  11:23 AM: Administered abortive 2 mg IV lorazepam.  I paged Dr. Otelia Limes with neurology.  Following Ativan administration, he was able to follow commands.  He told me his name, and explained that he had a visual aura of black spots prior to the event.  He still had a left arm tremor, twitching in his eyes, but no lower body tonic/clonic activity. Because of persistent possible focal seizure-like activity in setting of patient's history of PEA arrest, rapid response was called and RN came to the bedside for additional resources.  I requested attending Dr. Deirdre Priest to come to the bedside for evaluation.  He thought symptoms are most consistent with partial seizure.  He spoke with Dr. Otelia Limes over the phone with plan for Keppra 1000 mg x 1 and repeat dose of lorazepam 2 mg.    Assessment: Concern for generalized grand mal seizure with possible partial seizure given transient alertness/ability to follow commands while having twitching of left arm.  Plan: Although EEG this morning was normal, his symptoms seem consistent with seizure.  Question if this is related to delayed dose of PO Keppra 500 mg tablet, which patient refused last night at 2251, and was administered this morning at 0553.  Possible contribution by electrolyte derangements. -Run normal saline at 50 cc/hour to ensure patency of IV given difficult access in the past -Keppra 1000 mg bolus x 1, per neurology recommendations -S/p Ativan 2 mg x 2 doses.  Monitor airway protection closely. -IV potassium 10 mEq x6 -Caution with benzodiazepines and other  sedating agents given patient's history of PEA arrest, and need to protect airway -Neurology is aware of event.  They are coming to evaluate patient at the bedside.  Await recommendations.

## 2023-06-07 NOTE — Assessment & Plan Note (Addendum)
-   Increased Hydralazine 100mg  q8h, per cards - Change hydralazine to IV 50 mg q8 as patient cannot tolerate p.o. at this time due to seizure activity. - Cont Lasix as above - Holding home amlodipine, lisinopril

## 2023-06-07 NOTE — Progress Notes (Signed)
OT Cancellation Note  Patient Details Name: Timothy Hubbard MRN: [id number] DOB: Dec 18, 1990   Cancelled Treatment:    Reason Eval/Treat Not Completed: Medical issues which prohibited therapy (Pt with seizure earlier this day and not appropriate for participation in OT session at this time per RN. OT to continue to follow acutely.)  Levonia Wolfley "Ronaldo Miyamoto" M., OTR/L, MA Acute Rehab [phone]   Lendon Colonel 06/07/2023, 2:31 PM

## 2023-06-07 NOTE — Progress Notes (Signed)
Daily Progress Note Intern Pager: [phone]  Patient name: Timothy Hubbard Medical record number: [id number] Date of birth: 06-26-1991 Age: 32 y.o. Gender: male  Primary Care Provider: Vonna Drafts, MD Consultants: Cardiology/Heart Failure, Nephrology, Oncology (signed off 10/1) Code Status: FULL   Pt Overview and Major Events to Date:  9/19 2 PEA arrests ROSC achieved, admit to ICU; seizure and cardiac arrest 9/20 extubated 05/27/2023: PEA arrest and after woke up and was confused.  Total of 2 rounds of CPR with epi and bicarb x 1.  But became bradycardic > intubated.  Had A-line placed.  Also central line ascites tap did show blood.  Was given 4 units PRBC, 4 units FFP and 2 units platelets and 1 unit of cryo major transfusion protocol Cc consult for hemoperitoneum.  DIC panel positive.  He had some oozing from mouth and IV sites EEG showed symmetric low voltage no/seizure activity 06/01/2023: Extubated 06/03/2023: Transferred to FMTS 06/05/2023: Transitioning to peripheral IV, switching heparin to oral anticoagulant (eliquis)  06/07/2023: Apparent tonic-clonic seizure   Assessment and Plan: Timothy Hubbard. Timothy Hubbard is a 32 year old male with PMH of seizures, hypertension and asthma who presented after witnessed seizure with PEA arrest x 2 who was intubated and transferred to ICU 9/19 and out to FMTS on 9/28.    Assessment & Plan Acute HFrEF (heart failure with reduced ejection fraction) (HCC) Repeat echo on 05/26/2023 shows recovered EF 55 to 60%. - Cont Lasix drip for fluid overload  - Cont imdur 30mg  daily - Cardiology consulted, appreciate recs Acute respiratory failure with hypercapnia (HCC) - Currently on room air  - Diurese discussed below - Tylenol prn for rib pain.  -Consider further pain management as needed to prevent atelectasis.  - Tx asthma as below - Incentive spirometry + pulm toilet as indicated - PT OT for deconditioning Acute deep vein thrombosis (DVT) of right  peroneal vein (HCC) Per Heme/Onc, will need anticoag with Eliquis for 6 months.  - Eliquis 5 mg BID dosing - AM CBC AKI (acute kidney injury) (HCC) Thought to be ATN 2/2 cardiac arrest. Cr as high as 4.6; today 2.23. - Cont Lasix 80mg  bid - Nephrology consulted, appreciate recs - Strict I/O's - Daily weights - Avoid nephrotoxic drugs - Preferred narcotics for pain control will be hydromorphone, fentanyl, and methadone  - f/u ANCA analysis for possible AI vasculitides  Cardiac arrest (HCC) S/p PEA arrest 9/19 and 9/21, now extubated, off pressors and transitioned out of ICU. - Cardiology consulted, appreciate recs Seizure disorder (HCC) Tonic-clonic seizure this morning.  Recovered initially with 2 mg Ativan, but then continued to have some seizure-like activity with waxing and waning alertness.  Given additional 2 mg Ativan and 1000 mg Keppra per Neurology. - Cont Keppra 500mg  BID - Seizure precautions - Neurology consulted, appreciate recs.  Hypertension - Increased Hydralazine 100mg  q8h, per cards - Change hydralazine to IV 50 mg q8 as patient cannot tolerate p.o. at this time due to seizure activity. - Cont Lasix as above - Holding home amlodipine, lisinopril  Asthma - Cont pulmicort and brovana - Duonebs q4h prn Pleural effusion, right Likely 2/2 fluid overload. - Lasix drip as above Coagulopathy (HCC) S/p 4 units PRBC, 4 units FFP, and 2 units platelets and 1 unit of cryo. Hgb 12, plt 217. No evidence of active bleed.  - Heme/Onc consulted, recommend continuing Eliquis for 6 months. Shock liver 2/2 cardiac arrest. Transaminases have been steadily downtrending. Abdominal exam benign today. Hypokalemia On  40 meq supplementation daily. 2.9 this AM. - Cardiology ordered repletion with today - will also give BID - recheck K in AM - Cardiology questions possible hyperaldosteronism with persistently low K and elevated BPs. Will consider adding spironolactone once  renal fxn improves.   Chronic and Stable Problems:  Mood disorder: olanzapine 5 mg nightly   FEN/GI: DYS3, Boost.  PPx: Eliquis Dispo: SNF per PT/OT recommendations; Awaiting placement.  Ongoing workup for new seizure event this morning.  Subjective:  Patient seen this morning sitting up in bed, talkative and pleasant.  However he has noted to have significant tremor to his left arm, and it is more difficult to understand his speech than yesterday.  He denies pain.  Reports a good appetite.  Patient is seen later in the morning following a page from RN for seizure-like activity.  Please see note from Dr. Melissa Noon today.  Objective: Temp:  [97.9 F (36.6 C)-98.5 F (36.9 C)] 98.4 F (36.9 C) (10/02 0941) Pulse Rate:  [49-107] 100 (10/02 1220) Resp:  [16-19] 16 (10/02 0941) BP: (139-185)/(100-147) 175/127 (10/02 1220) SpO2:  [91 %-100 %] 100 % (10/02 1220) Weight:  [75.9 kg] 75.9 kg (10/02 0714) Physical Exam: General: Well-appearing, pleasant, NAD.  Notable left arm jerking and shaking. Cardiovascular: RRR, no murmurs Respiratory: CTA bilaterally Abdomen: Normoactive bowel sounds, soft, nontender, nondistended.  No masses. Extremities: Moves all extremities equally, though apparent uncontrollable jerking movements to left upper extremity.  Patient retains full range of motion of bilateral upper extremities.  Bilateral upper extremity strength and sensation intact 5/5.   Laboratory: Most recent CBC Lab Results  Component Value Date   WBC 8.9 06/07/2023   HGB 12.0 (L) 06/07/2023   HCT 36.1 (L) 06/07/2023   MCV 88.9 06/07/2023   PLT 217 06/07/2023   Most recent BMP    Latest Ref Rng & Units 06/07/2023   11:59 AM  BMP  Glucose 70 - 99 mg/dL 87   BUN 6 - 20 mg/dL 28   Creatinine 9.52 - 1.24 mg/dL 8.41   Sodium 324 - 401 mmol/L 143   Potassium 3.5 - 5.1 mmol/L 3.3   Chloride 98 - 111 mmol/L 99   CO2 22 - 32 mmol/L 28   Calcium 8.9 - 10.3 mg/dL 8.6    Magnesium  1.4  EEG: This study is within normal limits.  No seizures or epileptiform discharges were seen throughout the recording.   [name], DO 06/07/2023, 2:06 PM  PGY-1, [facility] Family Medicine FPTS Intern pager: [phone], text pages welcome Secure chat group [facility] Teaching Service

## 2023-06-07 NOTE — Assessment & Plan Note (Addendum)
Repeat echo on 05/26/2023 shows recovered EF 55 to 60%. - Cont Lasix drip for fluid overload  - Cont imdur 30mg  daily - Cardiology consulted, appreciate recs

## 2023-06-07 NOTE — Progress Notes (Signed)
Responded to code stroke activation. Spoke with RRT RN regarding access needs. VAST services not needed at this time.

## 2023-06-07 NOTE — Progress Notes (Addendum)
Patient Name: Timothy Hubbard Date of Encounter: 06/07/2023 [facility] Cardiologist: None   Interval Summary  .    Renal function liver function continues to improve.  Potassium remains to be persistently low.  He has a new onset left hand tremor that is currently being evaluated for possible seizure-like activity.  He is doing well and does not have any complaints.  Vital Signs .    Vitals:   06/06/23 0701 06/06/23 0901 06/06/23 2021 06/07/23 0300  BP:  (!) 154/113 (!) 161/102 (!) 185/112  Pulse:  93 97 86  Resp:  15 18 18   Temp:  98.6 F (37 C) 97.9 F (36.6 C) 98.5 F (36.9 C)  TempSrc:  Oral Oral Oral  SpO2:  95% 96% 92%  Weight: 75.9 kg       Intake/Output Summary (Last 24 hours) at 06/07/2023 0804 Last data filed at 06/06/2023 1620 Gross per 24 hour  Intake 968 ml  Output 700 ml  Net 268 ml      06/06/2023    7:01 AM 06/05/2023    9:00 AM 06/05/2023    6:25 AM  Last 3 Weights  Weight (lbs) 167 lb 5.3 oz 167 lb 8 oz 168 lb 14.4 oz  Weight (kg) 75.9 kg 75.978 kg 76.613 kg      Telemetry/ECG    Generally normal sinus rhythm with heart rates in the 80s and 90s.  A little bit tachycardic at times.  Heart rates 100-120.- Personally Reviewed  CV Studies    Echocardiogram 06/03/2023 1. Left ventricular ejection fraction, by estimation, is 55 to 60%. The  left ventricle has normal function. The left ventricle has no regional  wall motion abnormalities. Left ventricular diastolic parameters were  normal.   2. Right ventricular systolic function is normal. The right ventricular  size is normal. There is mildly elevated pulmonary artery systolic  pressure. The estimated right ventricular systolic pressure is 42.5 mmHg.   3. Large pleural effusion in the left lateral region.   4. The mitral valve is normal in structure. Trivial mitral valve  regurgitation. No evidence of mitral stenosis.   5. The aortic valve has an indeterminant number of cusps. Aortic valve   regurgitation is not visualized. No aortic stenosis is present.   6. The inferior vena cava is dilated in size with <50% respiratory  variability, suggesting right atrial pressure of 15 mmHg.   Comparison(s): Changes from prior study are noted. LVEF improved from 25%  to normal now.   Limited echocardiogram 05/28/2023 1. Limited study.   2. Left ventricular ejection fraction, by estimation, is 20 to 25%. The  left ventricle has severely decreased function. The left ventricle  demonstrates global hypokinesis.   3. RV-RA gradient 21 mmHg suggesting normal to mildly increased estimated  RVSP depending on CVP. Right ventricular systolic function is severely  reduced. The right ventricular size is moderately enlarged.   4. The mitral valve is grossly normal. Mild mitral valve regurgitation.   5. The tricuspid valve is abnormal. Tricuspid valve regurgitation is  moderate to severe.   6. The aortic valve is tricuspid.   7. Unable to estimate CVP.   Comparison(s): Prior images reviewed side by side. LVEF 20-25% with global  hypokinesis and decrease compared with prior study.   Echocardiogram 05/26/2023 1. Left ventricular ejection fraction, by estimation, is 60 to 65%. The  left ventricle has normal function. The left ventricle has no regional  wall motion abnormalities. Left ventricular diastolic  parameters were  normal.   2. Right ventricular systolic function is normal. The right ventricular  size is normal.   3. The mitral valve is normal in structure. Trivial mitral valve  regurgitation. No evidence of mitral stenosis.   4. The aortic valve is tricuspid. Aortic valve regurgitation is not  visualized. No aortic stenosis is present.   5. The inferior vena cava is normal in size with greater than 50%  respiratory variability, suggesting right atrial pressure of 3 mmHg.   Physical Exam .   GEN: No acute distress.   Neck: + JVD Cardiac: RRR, no murmurs, rubs, or gallops.   Respiratory: Diminished in the mid to lower bases  GI: Soft, nontender, non-distended  MS: minimal thigh edema  Patient Profile    Timothy Hubbard is a 32 y.o. male has hx of seizures, hypertension, asthma and admitted on 05/25/2023.  Patient here with PEA arrest x 2 status post CPR with ROSC admitted to the ICU intubated.  Postextubation had another PEA arrest 05/27/2023 with 2 rounds of CPR.  Echocardiogram showed reduced EF 20 to 25%.  Had normalization of EF 06/03/2023.  Course has been complicated by shock liver, DIC AKI, seizure.  Now has transitioned to general cardiology services.   Assessment & Plan .     Acute HFrEF Stress cardiomyopathy PEA arrest in the setting of seizures Echocardiogram 05/26/2023 normal EF 60 to 65% after first PEA arrest.  Second PEA arrest occurred on 05/26/2023 with EF 25%, severely reduced RV function.  Repeat echo on 05/26/2023 shows recovered EF 55 to 60%.  Appears to be progressing appropriately with improvement in renal and liver function.   Still significant JVD no SOB.  I's and O's have not been documented accurately.  With his albumin so low likely not getting full benefit of the Lasix, however hopefully this prevents excessive accumulation.  With his persistently low potassium and elevated BP, question if he is having some element of hyperaldosteronism.  Once renal function improves would like to try putting him on spironolactone.   We increased to hydralazine 50mg  every 8, Imdur 30 mg.  Titrate GDMT once renal function stabilizes more.  Consider initiating BB after diureses and once more stable.  Continue IV Lasix 80 mg twice daily.  Pleural effusions 06/05/2023 noted to be moderate.  Will repeat chest x-ray today.  AKI Likely ATN related to postarrest.  Previously normal renal function PTA.  Creatinine as high as 4.6.  Now downtrending appropriately.  Today 2.69>2.23.  Nephrology following.  Hypokalemia Persistently has been around 3.1 despite being  on PO and IV supplementation.  Ordering magnesium.  Giving 60 mill equivalents now.  Shock liver Related to postarrest.  LFTs continue to improve.  RLE DVT Has been transition to Eliquis now.  This has been held for some reason?  He did have some hemorrhoids however do believe this would preclude him from getting his eliquis.   Abuse/alcohol abuse Agreeable to cessation.  DIC Required MTP.  Suspected secondary to shock liver.  Hematology following.  Seems stable now with no further bleeding.  For questions or updates, please contact [facility] Please consult [email or web address] for contact info under        Signed, Abagail Kitchens, PA-C

## 2023-06-07 NOTE — Procedures (Signed)
Patient Name: Timothy Hubbard  MRN: [id number]  Epilepsy Attending: Charlsie Quest  Referring Physician/Provider: Shelby Mattocks, DO  Date: 06/07/2023 Duration: 28.15 mins   Patient history: 32 year old male with history of seizures, hypertension and asthma who presented after had witnessed seizure. EEG to evaluate for seizure   Level of alertness: Awake   AEDs during EEG study: LEV   Technical aspects: This EEG study was done with scalp electrodes positioned according to the 10-20 International system of electrode placement. Electrical activity was reviewed with band pass filter of 1-70Hz , sensitivity of 7 uV/mm, display speed of 18mm/sec with a 60Hz  notched filter applied as appropriate. EEG data were recorded continuously and digitally stored.  Video monitoring was available and reviewed as appropriate.   Description: The posterior dominant rhythm consists of 8-9 Hz activity of moderate voltage (25-35 uV) seen predominantly in posterior head regions, symmetric and reactive to eye opening and eye closing.  Photic driving was seen during photic stimulation.  Hyperventilation was not performed.  Of note, multiple times during the study, patient was noted to have left upper extremity tremor-like movement during which left arm was flexed at the elbow and had tremor-like movement with varying amplitude and frequency.  Concomitant EEG before, during and after the event did not show any EEG changes suggest seizure.   IMPRESSION: This study is within normal limits.  No seizures or epileptiform discharges were seen throughout the recording.  Intermittently throughout the study, patient was noted to have left upper extremity tremor like movement without concomitant EEG change.  Focal motor seizures may not be seen on scalp EEG.  However the semiology of this movement is not typical for a focal motor seizure.  Clinical correlation is recommended.   Timothy Hubbard [name]

## 2023-06-07 NOTE — Progress Notes (Signed)
FMTS Brief Progress Note  S: Received message from RN that patient was having left hand tremor.  Went to see patient with Dr. Dolan Amen.  Originally, he denied left upper extremity pain.  Pain but he could not tell.  Endorsed left hand jitteriness.  This is the only thing that was bothering him at the time.  He does not have recollection of when he has episodes of seizure and could not name his seizure medications.  He denies any numbness.  O: BP (!) 185/112 (BP Location: Right Arm)   Pulse 86   Temp 98.5 F (36.9 C) (Oral)   Resp 18   Wt 75.9 kg   SpO2 92%   BMI 27.01 kg/m   General: Well-appearing, NAD Neuro: Conversationally appropriate, resting and active tremor of left upper extremity, strength (grip, flexor and extensor) equal of bilateral upper extremities, 2+ left radial pulse  A/P: Left upper extremity tremor In the setting of not receiving nighttime Keppra dosage, question whether this could be partial seizure.  Discussed plan with RN, she considerable worsening occur, will give Ativan. -Obtain EEG -Give evening dose of Keppra.  Shelby Mattocks, DO 06/07/2023, 5:55 AM PGY-3, [facility] Night Resident  Please page [phone] with questions.

## 2023-06-07 NOTE — Progress Notes (Signed)
[facility] Progress Note  Name: Timothy Hubbard MRN: [id number] DOB: [birth year]   Subjective:  He had 1.6 liters UOP thus far today as well as one unmeasured urine void.  Per nursing he had a prolonged seizure earlier today.  He has woken up a little more recently.   Review of systems:  Limited secondary to mental status s/p seizure but this is improving her nursing  Denies shortness of breath and RN hasn't noticed any breathing issues today or yesterday   Has a condom catheter    Intake/Output Summary (Last 24 hours) at 06/07/2023 1820 Last data filed at 06/07/2023 1600 Gross per 24 hour  Intake 846.9 ml  Output 1600 ml  Net -753.1 ml    Vitals:  Vitals:   06/07/23 1145 06/07/23 1210 06/07/23 1220 06/07/23 1600  BP: (!) 160/117 (!) 174/125 (!) 175/127 (!) 146/82  Pulse:  (!) 101 100   Resp:    19  Temp:    98.8 F (37.1 C)  TempSrc:    Axillary  SpO2:  100% 100%   Weight:         Physical Exam:  General adult male in bed in no acute distress HEENT normocephalic atraumatic  Neck supple trachea midline Lungs clear to auscultation bilaterally normal work of breathing at rest; on room air Heart S1S2 no rub Abdomen soft nontender nondistended Extremities no edema appreciated; no cyanosis or clubbing Neuro - nods or shakes his head to respond to most questions; no active seizure activity and he is interactive; bed is padded  GU condom catheter in place with urine output    Medications reviewed   Labs:     Latest Ref Rng & Units 06/07/2023   11:59 AM 06/07/2023    4:12 AM 06/06/2023    4:58 AM  BMP  Glucose 70 - 99 mg/dL 87  97  79   BUN 6 - 20 mg/dL 28  29  31    Creatinine 0.61 - 1.24 mg/dL 6.29  5.28  4.13   Sodium 135 - 145 mmol/L 143  142  142   Potassium 3.5 - 5.1 mmol/L 3.3  2.9  3.1   Chloride 98 - 111 mmol/L 99  101  99   CO2 22 - 32 mmol/L 28  27  28    Calcium 8.9 - 10.3 mg/dL 8.6  8.5  8.7      Assessment/Plan:   # AKI  - Secondary  to ATN in the setting of arrest - Improving with supportive care  - Will discontinue lasix for now    Hypokalemia   - team is repleting with IV potassium    Microscopic hematuria  - note that foley placement may explain  -reviewed serologic work-up which was thus far unrevealing including ANA neg. Sent ANCA for completion noting serum complement and this was negative.    Thrombocytopenia - noted on prior labs and has since resolved without aggressive intervention such as pheresis - No schistocytes.  Hem/onc was consulted earlier in his hospitalization and r/o'd TTP    HFrEF  - pause lasix for now and reassess tomorrow   Seizures  - Per primary team and neurology   HTN  - improved control   Disposition - per primary team    Estanislado Emms, MD 06/07/2023 6:44 PM

## 2023-06-07 NOTE — Assessment & Plan Note (Addendum)
S/p 4 units PRBC, 4 units FFP, and 2 units platelets and 1 unit of cryo. Hgb 12, plt 217. No evidence of active bleed.  - Heme/Onc consulted, recommend continuing Eliquis for 6 months.

## 2023-06-07 NOTE — Assessment & Plan Note (Addendum)
Tonic-clonic seizure this morning.  Recovered initially with 2 mg Ativan, but then continued to have some seizure-like activity with waxing and waning alertness.  Given additional 2 mg Ativan and 1000 mg Keppra per Neurology. - Cont Keppra 500mg  BID - Seizure precautions - Neurology consulted, appreciate recs.

## 2023-06-07 NOTE — Progress Notes (Addendum)
At 1111, pt sister notified nursing staff, pt was having a seizure, upon entrance pt left arm was contracted and right arm was shaking, both legs were shaking as well. A left gaze was noted upon exam, Attending was notified right away and at bedside, IV ativan was given, Neuro was at bedside as well, stat EEG was ordered and labs were collected. Keppra was ran as well. Rapid response was notified and at bedside by 1140.   Pt currently lightly sedated, all PO meds were transitioned to IV, VSS.   Balinda Quails, RN 06/07/2023 2:05 PM

## 2023-06-07 NOTE — Progress Notes (Signed)
Neurology Progress Note   S:// Acute code stroke activated due to left arm weakness Unclear last known well  O:// Current vital signs: BP (!) 185/86   Pulse (!) 124   Temp 98.2 F (36.8 C) (Oral)   Resp 19   Wt 75.9 kg   SpO2 99%   BMI 27.01 kg/m  Vital signs in last 24 hours: Temp:  [98.2 F (36.8 C)-98.8 F (37.1 C)] 98.2 F (36.8 C) (10/02 2003) Pulse Rate:  [49-124] 124 (10/02 2006) Resp:  [16-24] 19 (10/02 1600) BP: (139-185)/(82-147) 185/86 (10/02 2006) SpO2:  [91 %-100 %] 99 % (10/02 2006) Weight:  [75.9 kg] 75.9 kg (10/02 0714) Neurological exam Somewhat drowsy, answers question appropriately. Was able to tell me his name, correct age, for the month he said it is August. Follow simple commands No evidence of aphasia Cranial nerves II to XII intact Motor examination with significant distal left upper extremity weakness with proximal at least 4/5 at the left shoulder.  Has at least symmetric antigravity strength in bilateral lower extremities Sensation intact light touch Coordination with no dysmetria  NIHSS 1a Level of Conscious.: 1 1b LOC Questions: 0 1c LOC Commands: 0 2 Best Gaze: 0 3 Visual: 0 4 Facial Palsy: 0 5a Motor Arm - left: 2 5b Motor Arm - Right: 0 6a Motor Leg - Left: 1 6b Motor Leg - Right: 1 7 Limb Ataxia: 0 8 Sensory: 0 9 Best Language: 0 10 Dysarthria: 0 11 Extinct. and Inatten.: 0 TOTAL: 5   Medications  Current Facility-Administered Medications:    0.9 %  sodium chloride infusion, 250 mL, Intravenous, Continuous, Chand, Garnet Sierras, MD, Stopped at 05/31/23 1438   0.9 %  sodium chloride infusion, , Intravenous, PRN, Cheri Fowler, MD, Stopped at 05/28/23 1959   acetaminophen (OFIRMEV) IV 650 mg, 650 mg, Intravenous, Q6H PRN, Mliss Sax, Sarah, DO   arformoterol (BROVANA) nebulizer solution 15 mcg, 15 mcg, Nebulization, BID, Lorin Glass, MD, 15 mcg at 06/07/23 0827   budesonide (PULMICORT) nebulizer solution 0.5 mg, 0.5 mg,  Nebulization, BID, Lidia Collum, PA-C, 0.5 mg at 06/07/23 0827   docusate (COLACE) 50 MG/5ML liquid 100 mg, 100 mg, Oral, BID PRN, Lynnell Catalan, MD   doxepin (SINEQUAN) capsule 10 mg, 10 mg, Oral, QHS PRN, Jena Gauss, Allee, MD   feeding supplement (BOOST / RESOURCE BREEZE) liquid 1 Container, 1 Container, Oral, TID BM, Mannam, Praveen, MD   folic acid (FOLVITE) tablet 1 mg, 1 mg, Oral, Daily, Maxwell, Allee, MD, 1 mg at 06/07/23 0945   hydrALAZINE (APRESOLINE) injection 50 mg, 50 mg, Intravenous, Q8H, Spence, Sarah, DO, 50 mg at 06/07/23 1356   ipratropium-albuterol (DUONEB) 0.5-2.5 (3) MG/3ML nebulizer solution 3 mL, 3 mL, Nebulization, Q4H PRN, Lidia Collum, PA-C, 3 mL at 06/01/23 2031   isosorbide mononitrate (IMDUR) 24 hr tablet 30 mg, 30 mg, Oral, Daily, Laurey Morale, MD, 30 mg at 06/07/23 0945   levETIRAcetam (KEPPRA) IVPB 500 mg/100 mL premix, 500 mg, Intravenous, Q12H, Caryl Pina, MD   lip balm (CARMEX) ointment, , Topical, PRN, Agarwala, Ravi, MD   LORazepam (ATIVAN) injection 2 mg, 2 mg, Intravenous, Q5 min PRN, Caryl Pina, MD   magnesium sulfate IVPB 4 g 100 mL, 4 g, Intravenous, Once, Dahbura, Anton, DO   OLANZapine (ZYPREXA) injection 5 mg, 5 mg, Intramuscular, QHS, Spence, Sarah, DO   ondansetron (ZOFRAN) injection 4 mg, 4 mg, Intravenous, Q6H PRN, Cheri Fowler, MD   Oral care mouth rinse, 15 mL, Mouth Rinse,  4 times per day, Lynnell Catalan, MD, 15 mL at 06/07/23 1614   Oral care mouth rinse, 15 mL, Mouth Rinse, PRN, Agarwala, Ravi, MD   pantoprazole (PROTONIX) injection 40 mg, 40 mg, Intravenous, Q12H, Spence, Sarah, DO   phenylephrine-shark liver oil-mineral oil-petrolatum (PREPARATION H) rectal ointment 1 Application, 1 Application, Rectal, BID PRN, Evette Georges, MD   polyethylene glycol (MIRALAX / GLYCOLAX) packet 17 g, 17 g, Oral, Daily, Dahbura, Anton, DO   potassium chloride 10 mEq in 100 mL IVPB, 10 mEq, Intravenous, Q1 Hr x 2, Spence, Sarah, DO, Last Rate: 100  mL/hr at 06/07/23 1952, 10 mEq at 06/07/23 1952   sodium chloride flush (NS) 0.9 % injection 10-40 mL, 10-40 mL, Intracatheter, Q12H, Lorin Glass, MD, 10 mL at 06/07/23 0947   sodium chloride flush (NS) 0.9 % injection 10-40 mL, 10-40 mL, Intracatheter, PRN, Lorin Glass, MD   thiamine (VITAMIN B1) tablet 100 mg, 100 mg, Oral, Daily, Agarwala, Ravi, MD, 100 mg at 06/07/23 0945   white petrolatum (VASELINE) gel, , Topical, PRN, Cheri Fowler, MD   witch hazel-glycerin (TUCKS) pad, , Topical, PRN, Shelby Mattocks, DO  Labs CBC    Component Value Date/Time   WBC 8.9 06/07/2023 0412   RBC 4.06 (L) 06/07/2023 0412   HGB 12.0 (L) 06/07/2023 0412   HGB 12.7 (L) 04/04/2023 1040   HCT 36.1 (L) 06/07/2023 0412   HCT 29.3 (L) 05/25/2023 1532   PLT 217 06/07/2023 0412   PLT 235 04/04/2023 1040   MCV 88.9 06/07/2023 0412   MCV 95 04/04/2023 1040   MCH 29.6 06/07/2023 0412   MCHC 33.2 06/07/2023 0412   RDW 19.5 (H) 06/07/2023 0412   RDW 11.4 (L) 04/04/2023 1040   LYMPHSABS 2.5 06/04/2023 1227   LYMPHSABS 3.4 (H) 04/04/2023 1040   MONOABS 2.0 (H) 06/04/2023 1227   EOSABS 0.0 06/04/2023 1227   EOSABS 0.0 04/04/2023 1040   BASOSABS 0.0 06/04/2023 1227   BASOSABS 0.0 04/04/2023 1040    CMP     Component Value Date/Time   NA 143 06/07/2023 1159   NA 139 05/22/2023 1213   K 3.3 (L) 06/07/2023 1159   CL 99 06/07/2023 1159   CO2 28 06/07/2023 1159   GLUCOSE 87 06/07/2023 1159   BUN 28 (H) 06/07/2023 1159   BUN 9 05/22/2023 1213   CREATININE 2.03 (H) 06/07/2023 1159   CREATININE 1.03 10/19/2016 1017   CALCIUM 8.6 (L) 06/07/2023 1159   PROT 5.3 (L) 06/06/2023 0458   PROT 5.8 (L) 02/13/2023 1725   ALBUMIN 2.6 (L) 06/07/2023 0412   ALBUMIN 3.5 (L) 02/13/2023 1725   AST 35 06/06/2023 0458   ALT 61 (H) 06/06/2023 0458   ALKPHOS 70 06/06/2023 0458   BILITOT 1.7 (H) 06/06/2023 0458   BILITOT <0.2 02/13/2023 1725   GFRNONAA 44 (L) 06/07/2023 1159   GFRNONAA >89 10/19/2016 1017    GFRAA 138 02/04/2020 1101   GFRAA >89 10/19/2016 1017    Lipid Panel     Component Value Date/Time   TRIG 66 05/31/2023 0400    Lab Results  Component Value Date   HGBA1C 5.7 (H) 05/26/2023     Imaging I have reviewed images in epic and the results pertinent to this consultation are: CT head with hypodensities in bifrontal hemispheres-age-indeterminate.  No hyperdense vessel. Question acute to subacute infarcts Given clinical history, question hypoxic/anoxic damage versus seizure edema.  Assessment: 32 year old man history of epilepsy presented with breakthrough seizures complicated by cardiac arrest  x 3 each with ROSC after a short interval.  Neurology reconsulted today for focal seizure recurrence followed by brief generalized tonic-clonic seizure activity and then focal motor status epilepticus.  Received multiple doses of Ativan.  This evening noted to be weaker on the left arm which I think he was before as well. No clear last known well precludes any IV thrombolysis. Exam not consistent with LVO and also poor baseline precludes EVT at this time. Will do further imaging  Recommendations: Stat CT head as above Remove EEG leads Stat MRI of the brain Further recommendations following the MRI   ADDENDUM MR brain completed and personally reviewed -  shows bifrontal and other areas of restricted diffusion - likely from HIE. Continue current care plan Re-hook EEG Will follow  -- Milon Dikes, MD Neurologist [facility] Pager: [phone]   CRITICAL CARE ATTESTATION Performed by: Milon Dikes, MD Total critical care time: 40 minutes Critical care time was exclusive of separately billable procedures and treating other patients and/or supervising APPs/Residents/Students Critical care was necessary to treat or prevent imminent or life-threatening deterioration. This patient is critically ill and at significant risk for neurological worsening and/or death and  care requires constant monitoring. Critical care was time spent personally by me on the following activities: development of treatment plan with patient and/or surrogate as well as nursing, discussions with consultants, evaluation of patient's response to treatment, examination of patient, obtaining history from patient or surrogate, ordering and performing treatments and interventions, ordering and review of laboratory studies, ordering and review of radiographic studies, pulse oximetry, re-evaluation of patient's condition, participation in multidisciplinary rounds and medical decision making of high complexity in the care of this patient.

## 2023-06-07 NOTE — Assessment & Plan Note (Addendum)
2/2 cardiac arrest. Transaminases have been steadily downtrending. Abdominal exam benign today.

## 2023-06-07 NOTE — Progress Notes (Signed)
PHARMACY - ANTICOAGULATION CONSULT NOTE  Pharmacy Consult for therapeutic enoxaparin Indication: DVT  Allergies  Allergen Reactions   Ibuprofen Anaphylaxis    Patient Measurements: Weight: 75.9 kg (167 lb 5.3 oz) Heparin Dosing Weight: n/a  Vital Signs: Temp: 98.8 F (37.1 C) (10/02 1600) Temp Source: Axillary (10/02 1600) BP: 146/82 (10/02 1600) Pulse Rate: 100 (10/02 1220)  Labs: Recent Labs    06/04/23 2153 06/04/23 2153 06/05/23 0442 06/05/23 1330 06/06/23 0458 06/07/23 0412 06/07/23 1159  HGB 11.7*  --  11.4*  --  11.5* 12.0*  --   HCT 35.7*  --  34.6*  --  34.4* 36.1*  --   PLT 192  --  191  --  213 217  --   HEPARINUNFRC 0.42  --   --  0.42  --   --   --   CREATININE  --    < > 3.36*  --  2.69* 2.23* 2.03*   < > = values in this interval not displayed.    Estimated Creatinine Clearance: 46.7 mL/min (A) (by C-G formula based on SCr of 2.03 mg/dL (H)).   Medical History: Past Medical History:  Diagnosis Date   Acute encephalopathy 03/10/2022   Adenomatous polyp of descending colon    Asthma    Encephalopathy acute 03/01/2022   Hematochezia    HTN (hypertension)    Hypokalemia 03/04/2022   Need for hepatitis C screening test 04/27/2022   Seizures [facility])     Assessment: [name] found to have DVT. Pt was started on Eliquis however now unable to take PO medications. Pharmacy consulted to transition to therapeutic enoxaparin.  Last dose eliquis 10/1 @ 0916   Goal of Therapy:  Enoxaparin anti-Xa 0.6-1 u/mL  Monitor platelets by anticoagulation protocol: Yes   Plan:  Start Enoxaparin 1mg /kg (80mg ) q12h F/u CBC, renal func, transition  back to enteral anticoagulation as able  Levels PRN if needed/long term therapy   Calton Dach, PharmD, BCCCP Clinical Pharmacist 06/07/2023 6:18 PM

## 2023-06-07 NOTE — TOC Progression Note (Signed)
Transition of Care [facility]) - Progression Note    Patient Details  Name: ELOISE MULA MRN: [id number] Date of Birth: October 26, 1990  Transition of Care [facility]) CM/SW Contact  Nicanor Bake Phone Number: [phone] 06/07/2023, 10:34 AM  Clinical Narrative:   HF CSW called and scheduled pts  Hospital follow up appointment scheduled for Tuesday, June 13, 2023 at 3:10PM.   [facility] will continue following.    Expected Discharge Plan: Home w Home Health Services (primary health choice 2hrs daily) Barriers to Discharge: Continued Medical Work up  Expected Discharge Plan and Services       Living arrangements for the past 2 months: Single Family Home                                       Social Determinants of Health (SDOH) Interventions SDOH Screenings   Food Insecurity: No Food Insecurity (04/23/2023)  Housing: Low Risk  (04/23/2023)  Transportation Needs: No Transportation Needs (04/23/2023)  Utilities: Not At Risk (04/23/2023)  Alcohol Screen: Low Risk  (12/16/2022)  Depression (PHQ2-9): High Risk (05/22/2023)  Financial Resource Strain: Medium Risk (04/01/2022)  Stress: Stress Concern Present (01/05/2023)  Tobacco Use: High Risk (05/25/2023)    Readmission Risk Interventions     No data to display

## 2023-06-07 NOTE — Assessment & Plan Note (Addendum)
Per Heme/Onc, will need anticoag with Eliquis for 6 months.  - Eliquis 5 mg BID dosing - AM CBC

## 2023-06-07 NOTE — Progress Notes (Signed)
FMTS Interim Progress Note  S: Rounded on patient tonight with Dr. Dolan Amen.  Approximately 7:45 PM, noticed patient was almost entirely unable to lift left upper extremity.  He notes that this has been going on for greater than 2 hours however he could lift his arm after his seizure this afternoon.  He remains difficult to understand.  Denies numbness in left upper extremity.  O: BP (!) 185/86   Pulse (!) 124   Temp 98.2 F (36.8 C) (Oral)   Resp 19   Wt 75.9 kg   SpO2 99%   BMI 27.01 kg/m   General: Fatigued appearing CV: Tachycardic, regular rhythm, no murmur appreciated Pulm: Normal WOB Abdomen: Normoactive bowel sounds Neuro: Conversationally appropriate although speech remains difficult to understand, cranial nerves II through XII intact (with the exception of shoulder shrug), left deltoid 2/5 strength, left hand grip 0/5, right hand grip 4/5, strength equal bilaterally in lower extremities, sensation intact in all extremities  A/P: Left upper extremity weakness Has a recent history this afternoon of grand mal seizure and recent Ativan provided and Keppra loaded.  However, recent physical exams do not clearly delineate the significance of strictly left upper extremity weakness.  Concern for stroke.  Code stroke called.  Rapid response RN and neurologist Dr. Wilford Corner responded.  CBG in appropriate range. -Appreciate neurology recommendations -Hold Lovenox -CT head without contrast changed to stat -Consider MRI afterwards   Shelby Mattocks, DO 06/07/2023, 8:20 PM PGY-3, [facility] Family Medicine Service pager [phone]

## 2023-06-07 NOTE — Progress Notes (Signed)
Brief review of HPI: 32 year old male with a history of epilepsy, on Depakote outpatient, who presented on 9/19 with breakthrough seizure that triggered bradycardia and then PEA cardiac arrest while en route to the ED via EMS. ROSC was achieved after 2 minutes, but patient when back into PEA arrest while in the ED, ROSC achieved the second time after 1 minute. Family stated that he had been compliant with Depakote, but level on presentation was low. He was loaded with VPA while still in the ED. The following day it was revealed by the patient, now awake, that he was being taken off Depakote and had been started on Keppra due to elevated LFTs. He had been compliant with the Keppra dosing regimen. VPA was therefore changed to Keppra on 9/20 with a 1000 mg load followed by 500 mg BID. He had recurrent cardiac arrest on 9/21; there was no definite seizure activity at the time of arrest, but he did have some leftward gaze as he became bradycardic. LTM obtained on 9/22-23 revealed severe diffuse encephalopathy without seizure activity.   RN states that starting in the early AM hours on Tuesday, he started to exhibit LUE tremoring movements that were continuous. He remained awake, alert and conversant during the persistent LUE tremoring, with no other limb jerking seen. The unilateral tremoring continued throughout the day yesterday. Of note, he did refuse to take his Keppra yesterday.   Today, neurology was called back to re-evaluate the patient after the above tremoring progressed to a GTC seizure, followed by persistent LUE jerking. The GTC seizure activity started at about 1111, initially seen by family who called RN to the room. RN noted that the patient's eyes were rolled back, right arm shaking, right leg was still, left arm contracted/flexed, left leg shaking. The patient had depressed level of consciousness during the event but was partially responsive and able to follow some commands after a delay. 2mg   Ativan was given and Rapid Response was called. Patient was turned onto his right side by staff. The generalized seizure activity abated with the Ativan, but LUE jerking continued and leftward eye deviation was then noted. Two more doses of 2 mg IV Ativan were given with associated decrements in the LUE jerking amplitude, but without complete cessation. Leftward gaze deviation then resolved. Keppra load 2000 mg IV was then administered. The Keppra was still infusing at the time of Neurology arrival.    Subjective: Called to re-evaluate the patient for seizure recurrence. Was last seen by Neurology on 9/25. At last progress note, we had recommended continuing Keppra at 500 mg BID.   Objective: Current vital signs: BP (!) 160/147 (BP Location: Right Arm)   Pulse (!) 49   Temp 98.4 F (36.9 C) (Oral)   Resp 16   Wt 75.9 kg   SpO2 100%   BMI 27.01 kg/m  Vital signs in last 24 hours: Temp:  [97.9 F (36.6 C)-98.5 F (36.9 C)] 98.4 F (36.9 C) (10/02 0941) Pulse Rate:  [49-97] 49 (10/02 0941) Resp:  [16-19] 16 (10/02 0941) BP: (160-185)/(102-147) 160/147 (10/02 0941) SpO2:  [92 %-100 %] 100 % (10/02 0941) Weight:  [75.9 kg] 75.9 kg (10/02 0714)  Intake/Output from previous day: 10/01 0701 - 10/02 0700 In: 968 [P.O.:568; IV Piggyback:400] Out: 700 [Urine:700] Intake/Output this shift: Total I/O In: 358 [P.O.:358] Out: 0  Nutritional status:  Diet Order             DIET DYS 3 Room service appropriate? Yes; Fluid consistency:  Thin  Diet effective now                  HEENT: [place]/AT.  Lungs: Respirations unlabored, but sonorous Ext: No edema. Low-amplitude semi-rhythmic LUE jerking is seen.   Neurologic Exam: Ment: Sedated with eyes closed and difficult to arouse. Sonorous respirations present which resolve when briefly awakened. He murmurs a one-word, dysarthric response when asked his name "Timothy Hubbard" but otherwise is not answering questions and is not following commands.  CN:  Eyes are near the midline without forced gaze deviation. PERRL. No nystagmus. No facial twitching seen. Does not follow commands for testing of grimace or tongue protrusion.  Motor/Sensory: Moves limbs slightly to light noxious stimuli. Increased tone in all 4 extremities to passive movement by examiner. Low-amplitude twitching of LUE is noted distally, most noticeable in the 4th and 5th digits.  Reflexes: 3+ bilateral brachioradialis, biceps and patellars. Positive Hoffman's sign bilateral. Positive  Babinski on the right, equivocal on the left. 4 beats of clonus with passive dorsiflexion of the feet bilaterally.  Cerebellar/Gait: Unable to test.   Lab Results: Results for orders placed or performed during the hospital encounter of 05/25/23 (from the past 48 hour(s))  Heparin level (unfractionated)     Status: None   Collection Time: 06/05/23  1:30 PM  Result Value Ref Range   Heparin Unfractionated 0.42 0.30 - 0.70 IU/mL    Comment: (NOTE) The clinical reportable range upper limit is being lowered to >1.10 to align with the FDA approved guidance for the current laboratory assay.  If heparin results are below expected values, and patient dosage has  been confirmed, suggest follow up testing of antithrombin III levels. Performed at [facility], [address] [place], [place] [address]   Glucose, capillary     Status: None   Collection Time: 06/05/23  3:51 PM  Result Value Ref Range   Glucose-Capillary 74 70 - 99 mg/dL    Comment: Glucose reference range applies only to samples taken after fasting for at least 8 hours.  Glucose, capillary     Status: None   Collection Time: 06/05/23  8:35 PM  Result Value Ref Range   Glucose-Capillary 81 70 - 99 mg/dL    Comment: Glucose reference range applies only to samples taken after fasting for at least 8 hours.  Glucose, capillary     Status: None   Collection Time: 06/05/23 11:58 PM  Result Value Ref Range   Glucose-Capillary 71 70  - 99 mg/dL    Comment: Glucose reference range applies only to samples taken after fasting for at least 8 hours.  Glucose, capillary     Status: None   Collection Time: 06/06/23  4:57 AM  Result Value Ref Range   Glucose-Capillary 76 70 - 99 mg/dL    Comment: Glucose reference range applies only to samples taken after fasting for at least 8 hours.  CBC     Status: Abnormal   Collection Time: 06/06/23  4:58 AM  Result Value Ref Range   WBC 10.2 4.0 - 10.5 K/uL   RBC 3.95 (L) 4.22 - 5.81 MIL/uL   Hemoglobin 11.5 (L) 13.0 - 17.0 g/dL   HCT 91.4 (L) 78.2 - 95.6 %   MCV 87.1 80.0 - 100.0 fL   MCH 29.1 26.0 - 34.0 pg   MCHC 33.4 30.0 - 36.0 g/dL   RDW 21.3 (H) 08.6 - 57.8 %   Platelets 213 150 - 400 K/uL   nRBC 0.0  0.0 - 0.2 %    Comment: Performed at [facility], [address] [place], [place] [address]  Comprehensive metabolic panel     Status: Abnormal   Collection Time: 06/06/23  4:58 AM  Result Value Ref Range   Sodium 142 135 - 145 mmol/L   Potassium 3.1 (L) 3.5 - 5.1 mmol/L   Chloride 99 98 - 111 mmol/L   CO2 28 22 - 32 mmol/L   Glucose, Bld 79 70 - 99 mg/dL    Comment: Glucose reference range applies only to samples taken after fasting for at least 8 hours.   BUN 31 (H) 6 - 20 mg/dL   Creatinine, Ser 6.04 (H) 0.61 - 1.24 mg/dL   Calcium 8.7 (L) 8.9 - 10.3 mg/dL   Total Protein 5.3 (L) 6.5 - 8.1 g/dL   Albumin 2.6 (L) 3.5 - 5.0 g/dL   AST 35 15 - 41 U/L   ALT 61 (H) 0 - 44 U/L   Alkaline Phosphatase 70 38 - 126 U/L   Total Bilirubin 1.7 (H) 0.3 - 1.2 mg/dL   GFR, Estimated 31 (L) >60 mL/min    Comment: (NOTE) Calculated using the CKD-EPI Creatinine Equation (2021)    Anion gap 15 5 - 15    Comment: Performed at [facility], [address], [place], [place] [address]  Glucose, capillary     Status: None   Collection Time: 06/06/23  8:56 AM  Result Value Ref Range   Glucose-Capillary 81 70 - 99 mg/dL    Comment: Glucose reference range applies only to  samples taken after fasting for at least 8 hours.  Renal function panel     Status: Abnormal   Collection Time: 06/07/23  4:12 AM  Result Value Ref Range   Sodium 142 135 - 145 mmol/L   Potassium 2.9 (L) 3.5 - 5.1 mmol/L   Chloride 101 98 - 111 mmol/L   CO2 27 22 - 32 mmol/L   Glucose, Bld 97 70 - 99 mg/dL    Comment: Glucose reference range applies only to samples taken after fasting for at least 8 hours.   BUN 29 (H) 6 - 20 mg/dL   Creatinine, Ser 1.19 (H) 0.61 - 1.24 mg/dL   Calcium 8.5 (L) 8.9 - 10.3 mg/dL   Phosphorus 3.0 2.5 - 4.6 mg/dL   Albumin 2.6 (L) 3.5 - 5.0 g/dL   GFR, Estimated 39 (L) >60 mL/min    Comment: (NOTE) Calculated using the CKD-EPI Creatinine Equation (2021)    Anion gap 14 5 - 15    Comment: Performed at [facility], [address] [place], [place] [address]  CBC     Status: Abnormal   Collection Time: 06/07/23  4:12 AM  Result Value Ref Range   WBC 8.9 4.0 - 10.5 K/uL   RBC 4.06 (L) 4.22 - 5.81 MIL/uL   Hemoglobin 12.0 (L) 13.0 - 17.0 g/dL   HCT 95.6 (L) 21.3 - 08.6 %   MCV 88.9 80.0 - 100.0 fL   MCH 29.6 26.0 - 34.0 pg   MCHC 33.2 30.0 - 36.0 g/dL   RDW 57.8 (H) 46.9 - 62.9 %   Platelets 217 150 - 400 K/uL   nRBC 0.0 0.0 - 0.2 %    Comment: Performed at [facility], [address] [place], [place] [address]  Magnesium     Status: Abnormal   Collection Time: 06/07/23  4:12 AM  Result Value  Ref Range   Magnesium 1.4 (L) 1.7 - 2.4 mg/dL    Comment: Performed at [facility], [address], [place], [place] [address]    No results found for this or any previous visit (from the past 240 hour(s)).  Lipid Panel No results for input(s): "CHOL", "TRIG", "HDL", "CHOLHDL", "VLDL", "LDLCALC" in the last 72 hours.  Studies/Results: EEG adult  Result Date: 06/07/2023 Charlsie Quest, MD     06/07/2023  8:52 AM Patient Name: Timothy Hubbard MRN: [id number] Epilepsy Attending: Charlsie Quest Referring Physician/Provider: Shelby Mattocks, DO Date: 06/07/2023 Duration: 28.15 mins  Patient history: 32 year old male with history of seizures, hypertension and asthma who presented after had witnessed seizure. EEG to evaluate for seizure  Level of alertness: Awake  AEDs during EEG study: LEV  Technical aspects: This EEG study was done with scalp electrodes positioned according to the 10-20 International system of electrode placement. Electrical activity was reviewed with band pass filter of 1-70Hz , sensitivity of 7 uV/mm, display speed of 78mm/sec with a 60Hz  notched filter applied as appropriate. EEG data were recorded continuously and digitally stored.  Video monitoring was available and reviewed as appropriate.  Description: The posterior dominant rhythm consists of 8-9 Hz activity of moderate voltage (25-35 uV) seen predominantly in posterior head regions, symmetric and reactive to eye opening and eye closing.  Photic driving was seen during photic stimulation.  Hyperventilation was not performed. Of note, multiple times during the study, patient was noted to have left upper extremity tremor-like movement during which left arm was flexed at the elbow and had tremor-like movement with varying amplitude and frequency.  Concomitant EEG before, during and after the event did not show any EEG changes suggest seizure.  IMPRESSION: This study is within normal limits.  No seizures or epileptiform discharges were seen throughout the recording. Intermittently throughout the study, patient was noted to have left upper extremity tremor like movement without concomitant EEG change.  Focal motor seizures may not be seen on scalp EEG.  However the semiology of this movement is not typical for a focal motor seizure.  Clinical correlation is recommended.  Priyanka [name]    Medications: Scheduled:  arformoterol  15 mcg Nebulization BID   budesonide (PULMICORT) nebulizer solution  0.5 mg Nebulization BID   feeding supplement  1 Container Oral TID BM   folic  acid  1 mg Oral Daily   furosemide  80 mg Intravenous BID   hydrALAZINE  100 mg Oral Q8H   isosorbide mononitrate  30 mg Oral Daily   levETIRAcetam  500 mg Oral BID   OLANZapine zydis  5 mg Oral QHS   mouth rinse  15 mL Mouth Rinse 4 times per day   pantoprazole  40 mg Oral BID   polyethylene glycol  17 g Oral Daily   potassium chloride  40 mEq Oral BID   sodium chloride flush  10-40 mL Intracatheter Q12H   thiamine  100 mg Oral Daily   Continuous:  sodium chloride Stopped (05/31/23 1438)   sodium chloride Stopped (05/28/23 1959)   magnesium sulfate bolus IVPB        Assessment: Patient is a 32 year old male with a history of epilepsy who presented with breakthrough seizures c/b cardiac arrest x 3 each with ROSC after a short time interval. Neurology was re-consulted today for focal seizure recurrence followed by brief GTC activity and then focal motor status epilepticus  - Exam reveals a sedated patient with  low-amplitude LUE shaking that diminished over the course of the exam as 1000 mg Keppra load was infusing. Had received Ativan 2 mg IV x 3 prior to Neurology arrival.  - EEG from this morning: This study is within normal limits.  No seizures or epileptiform discharges were seen throughout the recording.  - EEGs 9/22-9/25: Intermittent slowing, generalized. The study findings were suggestive of moderate diffuse encephalopathy.   Recommendations: - Continue Keppra 500 mg twice daily. Switching to IV due to patient being non-compliant with oral dosing yesterday.   - LTM EEG   Addendum: Initial spot EEG prior to starting LTM is within normal limits, with no seizures or epileptiform discharges seen throughout the recording.    -  LOS: 13 days   @Electronically  signed: Dr. Caryl Pina 06/07/2023  11:42 AM

## 2023-06-07 NOTE — Progress Notes (Signed)
Pt taken off for MRI. RN calling when pt is back. Left call phone number for RN

## 2023-06-07 NOTE — Plan of Care (Signed)
  Problem: Education: Goal: Ability to manage disease process will improve Outcome: Progressing   Problem: Cardiac: Goal: Ability to achieve and maintain adequate cardiopulmonary perfusion will improve Outcome: Progressing   Problem: Neurologic: Goal: Promote progressive neurologic recovery Outcome: Progressing   Problem: Skin Integrity: Goal: Risk for impaired skin integrity will be minimized. Outcome: Progressing   Problem: Education: Goal: Knowledge of General Education information will improve Description: Including pain rating scale, medication(s)/side effects and non-pharmacologic comfort measures Outcome: Progressing   Problem: Health Behavior/Discharge Planning: Goal: Ability to manage health-related needs will improve Outcome: Progressing   Problem: Clinical Measurements: Goal: Ability to maintain clinical measurements within normal limits will improve Outcome: Progressing Goal: Will remain free from infection Outcome: Progressing Goal: Diagnostic test results will improve Outcome: Progressing Goal: Respiratory complications will improve Outcome: Progressing Goal: Cardiovascular complication will be avoided Outcome: Progressing   Problem: Activity: Goal: Risk for activity intolerance will decrease Outcome: Progressing   Problem: Nutrition: Goal: Adequate nutrition will be maintained Outcome: Progressing   Problem: Coping: Goal: Level of anxiety will decrease Outcome: Progressing   Problem: Elimination: Goal: Will not experience complications related to bowel motility Outcome: Progressing Goal: Will not experience complications related to urinary retention Outcome: Progressing   Problem: Pain Managment: Goal: General experience of comfort will improve Outcome: Progressing   Problem: Safety: Goal: Ability to remain free from injury will improve Outcome: Progressing   Problem: Skin Integrity: Goal: Risk for impaired skin integrity will  decrease Outcome: Progressing   Problem: Education: Goal: Ability to describe self-care measures that may prevent or decrease complications (Diabetes Survival Skills Education) will improve Outcome: Progressing Goal: Individualized Educational Video(s) Outcome: Progressing   Problem: Coping: Goal: Ability to adjust to condition or change in health will improve Outcome: Progressing   Problem: Fluid Volume: Goal: Ability to maintain a balanced intake and output will improve Outcome: Progressing   Problem: Health Behavior/Discharge Planning: Goal: Ability to identify and utilize available resources and services will improve Outcome: Progressing Goal: Ability to manage health-related needs will improve Outcome: Progressing   Problem: Metabolic: Goal: Ability to maintain appropriate glucose levels will improve Outcome: Progressing   Problem: Nutritional: Goal: Maintenance of adequate nutrition will improve Outcome: Progressing Goal: Progress toward achieving an optimal weight will improve Outcome: Progressing   Problem: Skin Integrity: Goal: Risk for impaired skin integrity will decrease Outcome: Progressing   Problem: Tissue Perfusion: Goal: Adequacy of tissue perfusion will improve Outcome: Progressing

## 2023-06-07 NOTE — Assessment & Plan Note (Signed)
Likely 2/2 fluid overload. - Lasix drip as above

## 2023-06-07 NOTE — Assessment & Plan Note (Addendum)
-   Currently on room air  - Diurese discussed below - Tylenol prn for rib pain.  -Consider further pain management as needed to prevent atelectasis.  - Tx asthma as below - Incentive spirometry + pulm toilet as indicated - PT OT for deconditioning

## 2023-06-07 NOTE — Progress Notes (Deleted)
Pt removed from Bipap and placed on 2L via nasal cannula per Dr. Kemper Durie. Pt tol well at this time

## 2023-06-07 NOTE — Progress Notes (Signed)
Order came it at change of shift. As per Neurology, ok to do when day shift arrives.

## 2023-06-07 NOTE — Assessment & Plan Note (Signed)
S/p PEA arrest 9/19 and 9/21, now extubated, off pressors and transitioned out of ICU.  - Cardiology consulted, appreciate recs

## 2023-06-07 NOTE — Progress Notes (Signed)
STAT LTM EEG hooked up and running - no initial skin breakdown - push button tested - [facility] monitoring.  

## 2023-06-07 NOTE — Code Documentation (Signed)
Responded to Code Stroke called at 2000 for L arm weakness, LSN-unclear. CBG-85, NIH-7 for LOC, L arm weakness, BLE weakness, and dysarthria. CT head-New areas of cortical/subcortical hypoattenuation within both superior frontal lobes and the superior right parietal lobe,concerning for acute to subacute infarcts. TNK not given-unknown LSN. Plan: VS/neuro checks q2h x12, then q4h, STAT MRI. Please do stroke swallow screen prior to any PO intake.

## 2023-06-07 NOTE — Significant Event (Signed)
Rapid Response Event Note   Reason for Call :  Seizure activity  Initial Focused Assessment:  Activity started approximately 1111, seen by family with immediate response by RN. "Eyes rolled back, right arm shaking, right leg was still, left arm contracted, left leg shaking." 2mg  ativan given prior to Rapid arrival. Patient partially responsive during event, following some commands delayed.  Upon Rapid RN arrival, patient lying on right side with help of staff, left upper extremity noted to have jerking activity. Eyes with left gaze, 4 sluggish.   143/100 (114) HR 107 RR 24 O2 91% 3L [place]   Interventions:  O2 placed, suction set up 2nd PIV 2mg  IV ativan x2 (plus 2mg  given prior to Rapid arrival), see MAR  1g IVPB Keppra x1 cEEG ordered stat MD at bedside Neurology to bedside  Plan of Care:  EEG, PRN ativan IV order, monitor respiratory status--could need higher level of care if seizure activity continues requiring more medications.  Event Summary:  MD Notified: M. Chambliss MD @bedside , Agnes Lawrence MD called [phone] Call Time: 1131 Arrival Time: 1134 End Time: 1225  Truddie Crumble, RN

## 2023-06-07 NOTE — Progress Notes (Signed)
PT Cancellation Note  Patient Details Name: Timothy Hubbard MRN: [id number] DOB: May 27, 1991   Cancelled Treatment:    Reason Eval/Treat Not Completed: Medical issues which prohibited therapy. Pt with seizure earlier today. Will continue to follow for PT.   Angelina Ok [facility] 06/07/2023, 1:06 PM Skip Mayer PT Acute [employer] [phone]

## 2023-06-07 NOTE — Progress Notes (Signed)
STAT EEG complete - results pending. ? ?

## 2023-06-07 NOTE — Assessment & Plan Note (Addendum)
Thought to be ATN 2/2 cardiac arrest. Cr as high as 4.6; today 2.23. - Cont Lasix 80mg  bid - Nephrology consulted, appreciate recs - Strict I/O's - Daily weights - Avoid nephrotoxic drugs - Preferred narcotics for pain control will be hydromorphone, fentanyl, and methadone  - f/u ANCA analysis for possible AI vasculitides

## 2023-06-08 ENCOUNTER — Other Ambulatory Visit: Payer: Self-pay

## 2023-06-08 ENCOUNTER — Other Ambulatory Visit (HOSPITAL_COMMUNITY): Payer: Self-pay

## 2023-06-08 ENCOUNTER — Inpatient Hospital Stay (HOSPITAL_COMMUNITY): Payer: Medicare Other

## 2023-06-08 DIAGNOSIS — I469 Cardiac arrest, cause unspecified: Secondary | ICD-10-CM | POA: Diagnosis not present

## 2023-06-08 DIAGNOSIS — R569 Unspecified convulsions: Secondary | ICD-10-CM | POA: Diagnosis not present

## 2023-06-08 LAB — BASIC METABOLIC PANEL
Anion gap: 13 (ref 5–15)
BUN: 23 mg/dL — ABNORMAL HIGH (ref 6–20)
CO2: 23 mmol/L (ref 22–32)
Calcium: 8.3 mg/dL — ABNORMAL LOW (ref 8.9–10.3)
Chloride: 105 mmol/L (ref 98–111)
Creatinine, Ser: 1.85 mg/dL — ABNORMAL HIGH (ref 0.61–1.24)
GFR, Estimated: 49 mL/min — ABNORMAL LOW (ref >60)
Glucose, Bld: 83 mg/dL (ref 70–99)
Potassium: 3.7 mmol/L (ref 3.5–5.1)
Sodium: 141 mmol/L (ref 135–145)

## 2023-06-08 LAB — MAGNESIUM: Magnesium: 3.3 mg/dL — ABNORMAL HIGH (ref 1.7–2.4)

## 2023-06-08 LAB — GLUCOSE, CAPILLARY
Glucose-Capillary: 85 mg/dL (ref 70–99)
Glucose-Capillary: 86 mg/dL (ref 70–99)

## 2023-06-08 LAB — CBC
HCT: 39 % (ref 39.0–52.0)
Hemoglobin: 12.9 g/dL — ABNORMAL LOW (ref 13.0–17.0)
MCH: 28.8 pg (ref 26.0–34.0)
MCHC: 33.1 g/dL (ref 30.0–36.0)
MCV: 87.1 fL (ref 80.0–100.0)
Platelets: 239 10*3/uL (ref 150–400)
RBC: 4.48 MIL/uL (ref 4.22–5.81)
RDW: 20.2 % — ABNORMAL HIGH (ref 11.5–15.5)
WBC: 9.8 10*3/uL (ref 4.0–10.5)
nRBC: 0 % (ref 0.0–0.2)

## 2023-06-08 MED ORDER — HYDRALAZINE HCL 50 MG PO TABS
50.0000 mg | ORAL_TABLET | Freq: Three times a day (TID) | ORAL | Status: DC
  Administered 2023-06-08 – 2023-06-12 (×13): 50 mg via ORAL
  Filled 2023-06-08 (×13): qty 1

## 2023-06-08 MED ORDER — PANTOPRAZOLE SODIUM 40 MG PO TBEC
40.0000 mg | DELAYED_RELEASE_TABLET | Freq: Two times a day (BID) | ORAL | Status: DC
  Administered 2023-06-08 – 2023-06-12 (×7): 40 mg via ORAL
  Filled 2023-06-08 (×7): qty 1

## 2023-06-08 MED ORDER — LEVETIRACETAM IN NACL 1000 MG/100ML IV SOLN
1000.0000 mg | Freq: Two times a day (BID) | INTRAVENOUS | Status: DC
  Administered 2023-06-08 – 2023-06-11 (×7): 1000 mg via INTRAVENOUS
  Filled 2023-06-08 (×8): qty 100

## 2023-06-08 MED ORDER — ONDANSETRON HCL 4 MG PO TABS: 4.0000 mg | ORAL_TABLET | Freq: Three times a day (TID) | ORAL | Status: DC | PRN

## 2023-06-08 MED ORDER — ENOXAPARIN SODIUM 80 MG/0.8ML IJ SOSY
80.0000 mg | PREFILLED_SYRINGE | Freq: Two times a day (BID) | INTRAMUSCULAR | Status: DC
  Administered 2023-06-08 (×2): 80 mg via SUBCUTANEOUS
  Filled 2023-06-08 (×2): qty 0.8

## 2023-06-08 MED ORDER — ACETAMINOPHEN 325 MG PO TABS
650.0000 mg | ORAL_TABLET | Freq: Four times a day (QID) | ORAL | Status: DC | PRN
  Administered 2023-06-08 – 2023-06-09 (×3): 650 mg via ORAL
  Filled 2023-06-08 (×3): qty 2

## 2023-06-08 NOTE — Assessment & Plan Note (Addendum)
-   Cont IV Hydralazine 50 mg q8, per Cards - Cont Lasix as above - Holding home amlodipine, lisinopril

## 2023-06-08 NOTE — Progress Notes (Signed)
Daily Progress Note Intern Pager: [phone]  Patient name: Timothy Hubbard Medical record number: [id number] Date of birth: [birth year] Age: 32 y.o. Gender: male  Primary Care Provider: Vonna Drafts, MD Consultants: Cardiology/Heart Failure, Nephrology, Oncology (signed off 10/1) Code Status: FULL   Pt Overview and Major Events to Date:  9/19 2 PEA arrests ROSC achieved, admit to ICU; seizure and cardiac arrest 9/20 extubated 05/27/2023: PEA arrest and after woke up and was confused.  Total of 2 rounds of CPR with epi and bicarb x 1.  But became bradycardic > intubated.  Had A-line placed.  Also central line ascites tap did show blood.  Was given 4 units PRBC, 4 units FFP and 2 units platelets and 1 unit of cryo major transfusion protocol Cc consult for hemoperitoneum.  DIC panel positive.  He had some oozing from mouth and IV sites EEG showed symmetric low voltage no/seizure activity 06/01/2023: Extubated 06/03/2023: Transferred to FMTS 06/05/2023: Transitioning to peripheral IV, switching heparin to oral anticoagulant (eliquis)  06/07/2023: Apparent tonic-clonic seizure 06/08/2023: Long-term EEG; passed SLP swallow eval and return to regular diet   Assessment and Plan: Timothy Hubbard. Timothy Hubbard is a 32 year old male with PMH of seizures, hypertension and asthma who presented after witnessed seizure with PEA arrest x 2 who was intubated and transferred to ICU 9/19 and out to FMTS on 9/28.   Mentation seems to be improving somewhat today, though he has limited use of his left arm due to weakness; this is new, and the reason for the code stroke called on him last night.  Imaging seems to show that this is sequela related to his prior PEAs and hypoxic ischemic injury.  Evaluated at bedside by SLP today who determined that patient can return to p.o. Assessment & Plan Acute HFrEF (heart failure with reduced ejection fraction) (HCC) Repeat echo on 05/26/2023 shows recovered EF 55 to 60%. - Cont Lasix for  fluid overload  - Cont imdur 30mg  daily - Cardiology consulted, appreciate recs Acute respiratory failure with hypercapnia (HCC) - Currently on room air  - Diurese discussed below - Tylenol prn for rib pain.  -Consider further pain management as needed to prevent atelectasis.  - Tx asthma as below - Incentive spirometry + pulm toilet as indicated - PT OT for deconditioning Acute deep vein thrombosis (DVT) of right peroneal vein (HCC) Per Heme/Onc, will need anticoag with Eliquis for 6 months.  -Was transition to Lovenox yesterday due to swallowing concerns; will restart Eliquis today -  Eliquis 5 mg BID dosing - AM CBC  AKI (acute kidney injury) (HCC) Thought to be ATN 2/2 cardiac arrest. Cr as high as 4.6; today 1.85. ANCA workup negative. - Cont Lasix 80mg  bid - Nephrology consulted, appreciate recs - Strict I/O's - Daily weights - Avoid nephrotoxic drugs - Preferred narcotics for pain control will be hydromorphone, fentanyl, and methadone  -AM BMP, magnesium Cardiac arrest (HCC) S/p PEA arrest 9/19 and 9/21, now extubated, off pressors and transitioned out of ICU. - Cardiology consulted, appreciate recs Seizure disorder (HCC) Tonic-clonic seizure yesterday. - Long-term EEG in progress per neurology - Cont Keppra 500mg  BID - Seizure precautions - Neurology consulted, appreciate recs.  -AM BMP, magnesium Hypertension - Cont IV Hydralazine 50 mg q8, per Cards - Cont Lasix as above - Holding home amlodipine, lisinopril  Asthma - Cont pulmicort and brovana - Duonebs q4h prn Pleural effusion, right Likely 2/2 fluid overload. - Lasix as above Coagulopathy (HCC) S/p 4 units PRBC,  4 units FFP, and 2 units platelets and 1 unit of cryo. Hgb 12.9, plt 239. No evidence of active bleed.  - Continue Eliquis for 6 months per hematology. Shock liver 2/2 cardiac arrest. Transaminases have been steadily downtrending. Abdominal exam benign today. Hypokalemia On 40 meq  supplementation daily.  3.7 this AM. -AM BMP - Cardiology questions possible hyperaldosteronism with persistently low K and elevated BPs. Will consider adding spironolactone once renal fxn improves.  Chronic and Stable Problems:  Mood disorder: olanzapine 5 mg nightly   FEN/GI: DYS3, Boost.  PPx: Eliquis Dispo: SNF per PT/OT recommendations; Awaiting placement.  Ongoing workup for new seizure event/hypoxic ischemic injury.  Subjective:  Patient seen lying in bed this morning with EEG running.  His speech is still difficult to understand but mildly improved from yesterday.  He complains about his inability to move his left arm and his associated weakness.  He asks for an ice pack for his wrist states that it hurts.  He shares that his mother will be coming by later this morning and he would like Timothy Hubbard to speak with her, states she is his guardian.  Objective: Temp:  [97.8 F (36.6 C)-98.8 F (37.1 C)] 98.3 F (36.8 C) (10/03 1138) Pulse Rate:  [69-124] 99 (10/03 1138) Resp:  [18-22] 18 (10/03 1138) BP: (125-185)/(82-113) 128/99 (10/03 1138) SpO2:  [90 %-100 %] 98 % (10/03 1138) Weight:  [75.7 kg] 75.7 kg (10/03 0865) Physical Exam: General: Ill-appearing, awake, speech difficult to understand.  No acute distress. Cardiovascular: Tachycardic, regular rhythm.  No murmurs. Respiratory: CTA bilaterally.  No increased work of breathing. Abdomen: Soft, nontender, nondistended.  No masses. Extremities: No tremor in Left UE as there was yesterday. Neuro: CN II: PERRL CN III, IV,VI: EOMI CVII: Symmetric smile and brow raise CN VIII: Normal hearing CN XII: Symmetric tongue protrusion  RUE strength 5/5; LUE 3/5 can resist gravity, but cannot resist me or grap with his left hand. Cannot open left hand.  Bilateral LEs strength 5/5. Normal sensation in UE and LE bilaterally    Laboratory: Most recent CBC Lab Results  Component Value Date   WBC 9.8 06/08/2023   HGB 12.9 (L) 06/08/2023    HCT 39.0 06/08/2023   MCV 87.1 06/08/2023   PLT 239 06/08/2023   Most recent BMP    Latest Ref Rng & Units 06/08/2023    4:19 AM  BMP  Glucose 70 - 99 mg/dL 83   BUN 6 - 20 mg/dL 23   Creatinine 7.84 - 1.24 mg/dL 6.96   Sodium 295 - 284 mmol/L 141   Potassium 3.5 - 5.1 mmol/L 3.7   Chloride 98 - 111 mmol/L 105   CO2 22 - 32 mmol/L 23   Calcium 8.9 - 10.3 mg/dL 8.3    Magnesium 3.3   06/07/2023 CT head without contrast: 1. New areas of cortical/subcortical hypoattenuation within both superior frontal lobes and the superior right parietal lobe, concerning for acute to subacute infarcts. 2. ASPECTS is 8.  06/07/2023 MRI brain without contrast: Multifocal cortical diffusion  restriction within the frontal lobes with areas of edema also affecting the cingulate gyrus and left parietal, compatible with acute hypoxic  ischemic injury.  [name], DO 06/08/2023, 1:50 PM  PGY-1, [facility] FPTS Intern pager: [phone], text pages welcome Secure chat group [facility] Teaching Service

## 2023-06-08 NOTE — Assessment & Plan Note (Signed)
-   Cont pulmicort and brovana - Duonebs q4h prn

## 2023-06-08 NOTE — Assessment & Plan Note (Addendum)
On 40 meq supplementation daily.  3.7 this AM. -AM BMP - Cardiology questions possible hyperaldosteronism with persistently low K and elevated BPs. Will consider adding spironolactone once renal fxn improves.

## 2023-06-08 NOTE — Progress Notes (Signed)
RN notified by EEG that patient was removing leads, came to assess and on lead on anterior forehead is removed.  Small abrasion noted, patient advised to not touch forehead.  EEG tech states she will have someone come replace lead.

## 2023-06-08 NOTE — Evaluation (Signed)
Clinical/Bedside Swallow Evaluation Patient Details  Name: Timothy Hubbard MRN: [id number] Date of Birth: April 29, 1991  Today's Date: 06/08/2023 Time: SLP Start Time (ACUTE ONLY): 1255 SLP Stop Time (ACUTE ONLY): 1320 SLP Time Calculation (min) (ACUTE ONLY): 25 min  Past Medical History:  Past Medical History:  Diagnosis Date   Acute encephalopathy 03/10/2022   Adenomatous polyp of descending colon    Asthma    Encephalopathy acute 03/01/2022   Hematochezia    HTN (hypertension)    Hypokalemia 03/04/2022   Need for hepatitis C screening test 04/27/2022   Seizures (HCC)    Past Surgical History:  Past Surgical History:  Procedure Laterality Date   COLONOSCOPY WITH PROPOFOL N/A 03/14/2022   Procedure: COLONOSCOPY WITH PROPOFOL;  Surgeon: Shellia Cleverly, DO;  Location: [facility] ENDOSCOPY;  Service: Gastroenterology;  Laterality: N/A;   POLYPECTOMY  03/14/2022   Procedure: POLYPECTOMY;  Surgeon: Shellia Cleverly, DO;  Location: [facility] ENDOSCOPY;  Service: Gastroenterology;;   HPI:  Patient is a [name] w/ pertinent PMH seizures, asthma, htn, mood disorder presents to [facility] ED on 9/19 w/ seizure and cardiac arrest.     Per family patient compliant w/ seizure medications.  Had a witnessed seizure at home. EMS arrived and patient unresponsive. On route to ED patient became bradycardic 20-30 and pea arrested. 1 round cpr performed. Patient breathing spontaneously post arrest. Started on epi drip. On ED arrival patient altered but withdrew to painful stimuli. During intubation patient pea arrested and rosc after 1 round of cpr. EKG sinus tachy 130s. He was extubated on 9/20 but on 9/21 he went into PEA arrest, required two rounds of CPR, epi x1 and bicarb x1. He became bradycardic and was reintubated. Patient extubated in morning on 9/26 to 3L oxygen via nasal cannula. CXR on 9/25 showed worsening aeration with enlarging large right pleural effusion and areas of atelectasis and/or consolidation throughout  lungs bilaterally, right >left. Repeat CXR on 9/26 showed mild improvement. Prior SLP re-assessed swallow on 06/02/23 and recommended Dysphagia 3/thin.  Pt requesting solids, so swallow re-evaluation ordered.   Assessment / Plan / Recommendation  Clinical Impression  Pt seen for clinical swallow assessment (re-assessment) with solids/thin liquids via straw with slight L facial asymmetry noted, but not impacting mastication/swallowing cumulatively.  Pt consumed thin/solids with adequate mastication, timely swallow and no overt s/sx of aspiration present.  Progression to Regular/thin liquids recommended with pt preferred foods/liquids to increase satiety.  ST will s/o at this time in acute setting.  Thank you for this consult. SLP Visit Diagnosis: Dysphagia, unspecified (R13.10)    Aspiration Risk  Mild aspiration risk    Diet Recommendation   Thin;Age appropriate regular  Medication Administration: Whole meds with liquid    Other  Recommendations Oral Care Recommendations: Oral care BID    Recommendations for follow up therapy are one component of a multi-disciplinary discharge planning process, led by the attending physician.  Recommendations may be updated based on patient status, additional functional criteria and insurance authorization.  Follow up Recommendations No SLP follow up      Assistance Recommended at Discharge    Functional Status Assessment Patient has had a recent decline in their functional status and demonstrates the ability to make significant improvements in function in a reasonable and predictable amount of time.  Frequency and Duration  (evaluation only)          Prognosis Prognosis for improved oropharyngeal function: Good      Swallow Study  General Date of Onset: 05/25/23 HPI: Patient is a [name] w/ pertinent PMH seizures, asthma, htn, mood disorder presents to [facility] ED on 9/19 w/ seizure and cardiac arrest.     Per family patient compliant w/ seizure  medications.  Had a witnessed seizure at home. EMS arrived and patient unresponsive. On route to ED patient became bradycardic 20-30 and pea arrested. 1 round cpr performed. Patient breathing spontaneously post arrest. Started on epi drip. On ED arrival patient altered but withdrew to painful stimuli. During intubation patient pea arrested and rosc after 1 round of cpr. EKG sinus tachy 130s. Patient remained hypotensive post intubation and cvl placed. Currently on 3 pressors. Temp 92 F and placed on bair hugger. LA 14. Given IV fluids and cultures obtained and placed on broad spectrum antibiotics. CXR w/ ett in good position; no infiltrate appreciated. Abg 6.88, 56, 379, 11. Given 2 amps of bicarb and ordered bicarb drip. CTA head, abd, pelvis ordered/pending. PCCM consulted for icu admission. Type of Study: Bedside Swallow Evaluation Previous Swallow Assessment: 06/02/23 recommended Dysphagia 3/thin Diet Prior to this Study: Dysphagia 3 (mechanical soft);Thin liquids (Level 0) Temperature Spikes Noted: No Respiratory Status: Room air History of Recent Intubation: Yes Total duration of intubation (days): 8 days (two intubations) Date extubated: 06/01/23 Behavior/Cognition: Alert;Cooperative;Agitated Oral Cavity Assessment: Within Functional Limits Oral Care Completed by SLP: Recent completion by staff Oral Cavity - Dentition: Adequate natural dentition Vision: Functional for self-feeding Self-Feeding Abilities: Able to feed self Patient Positioning: Upright in bed Baseline Vocal Quality: Normal Volitional Cough: Strong Volitional Swallow: Able to elicit    Oral/Motor/Sensory Function Overall Oral Motor/Sensory Function: Within functional limits (slight L facial asymmetry, but adequate for swallowing/mastication)   Ice Chips Ice chips: Not tested   Thin Liquid Thin Liquid: Within functional limits Presentation: Straw    Nectar Thick Nectar Thick Liquid: Not tested   Honey Thick Honey Thick  Liquid: Not tested   Puree Puree: Not tested   Solid     Solid: Within functional limits Presentation: Self Fed      Pat Karilyn Wind,M.S.,CCC-SLP 06/08/2023,1:27 PM

## 2023-06-08 NOTE — Progress Notes (Addendum)
Patient seen and examined, note reviewed with the signed Advanced Practice Provider. I personally reviewed laboratory data, imaging studies and relevant notes. I independently examined the patient and formulated the important aspects of the plan. I have personally discussed the plan with the patient and/or family. Comments or changes to the note/plan are indicated below.  Unfortunately had multiple neurologic events yesterday. From a cardiac standpoint, his blood pressure is improving, as is his renal function. We will continue to follow; many of his meds have been switched to IV given his seizures, will work to transition him back to oral when stable from a neuro perspective.  Jodelle Red, MD, PhD, [facility] [place]  [facility]  [place]  Heart & Vascular at [facility] at [facility] [address] [place], [place] [address] [phone]       Patient Name: Timothy Hubbard Date of Encounter: 06/08/2023 [facility] Cardiologist: None   Interval Summary  .    Renal function liver function continues to improve.  Unfortunately from a neurological standpoint he continues to have multiple seizure events yesterday requiring multiple doses of Ativan.  Potassium and other electrolytes much improved today along with blood pressure.  Still remains to be tachycardic at times.  Vital Signs .    Vitals:   06/08/23 0228 06/08/23 0430 06/08/23 0632 06/08/23 0825  BP: 130/85 129/89    Pulse:  (!) 103  69  Resp:  (!) 22  18  Temp:  98.3 F (36.8 C)    TempSrc:  Axillary    SpO2:  95%    Weight:   75.7 kg     Intake/Output Summary (Last 24 hours) at 06/08/2023 0911 Last data filed at 06/08/2023 0433 Gross per 24 hour  Intake 934.45 ml  Output 2950 ml  Net -2015.55 ml      06/08/2023    6:32 AM 06/07/2023    7:14 AM 06/06/2023    7:01 AM  Last 3 Weights  Weight (lbs) 166 lb 14.2 oz 167 lb 5.3 oz 167 lb 5.3 oz  Weight (kg) 75.7 kg 75.9  kg 75.9 kg      Telemetry/ECG    Generally sinus tach Heart rates 100-120.- Personally Reviewed  CV Studies    Echocardiogram 06/03/2023 1. Left ventricular ejection fraction, by estimation, is 55 to 60%. The  left ventricle has normal function. The left ventricle has no regional  wall motion abnormalities. Left ventricular diastolic parameters were  normal.   2. Right ventricular systolic function is normal. The right ventricular  size is normal. There is mildly elevated pulmonary artery systolic  pressure. The estimated right ventricular systolic pressure is 42.5 mmHg.   3. Large pleural effusion in the left lateral region.   4. The mitral valve is normal in structure. Trivial mitral valve  regurgitation. No evidence of mitral stenosis.   5. The aortic valve has an indeterminant number of cusps. Aortic valve  regurgitation is not visualized. No aortic stenosis is present.   6. The inferior vena cava is dilated in size with <50% respiratory  variability, suggesting right atrial pressure of 15 mmHg.   Comparison(s): Changes from prior study are noted. LVEF improved from 25%  to normal now.   Limited echocardiogram 05/28/2023 1. Limited study.   2. Left ventricular ejection fraction, by estimation, is 20 to 25%. The  left ventricle has severely decreased function. The left ventricle  demonstrates global hypokinesis.   3. RV-RA gradient 21 mmHg suggesting  normal to mildly increased estimated  RVSP depending on CVP. Right ventricular systolic function is severely  reduced. The right ventricular size is moderately enlarged.   4. The mitral valve is grossly normal. Mild mitral valve regurgitation.   5. The tricuspid valve is abnormal. Tricuspid valve regurgitation is  moderate to severe.   6. The aortic valve is tricuspid.   7. Unable to estimate CVP.   Comparison(s): Prior images reviewed side by side. LVEF 20-25% with global  hypokinesis and decrease compared with prior study.    Echocardiogram 05/26/2023 1. Left ventricular ejection fraction, by estimation, is 60 to 65%. The  left ventricle has normal function. The left ventricle has no regional  wall motion abnormalities. Left ventricular diastolic parameters were  normal.   2. Right ventricular systolic function is normal. The right ventricular  size is normal.   3. The mitral valve is normal in structure. Trivial mitral valve  regurgitation. No evidence of mitral stenosis.   4. The aortic valve is tricuspid. Aortic valve regurgitation is not  visualized. No aortic stenosis is present.   5. The inferior vena cava is normal in size with greater than 50%  respiratory variability, suggesting right atrial pressure of 3 mmHg.   Physical Exam .   GEN: No acute distress.   Neck: mild JVD Cardiac: RRR, no murmurs, rubs, or gallops.  Respiratory: Diminished in the mid to lower bases  GI: Soft, nontender, non-distended  MS: no thigh edema  Patient Profile    Timothy Hubbard is a 32 y.o. male has hx of seizures, hypertension, asthma and admitted on 05/25/2023.  Patient here with PEA arrest x 2 status post CPR with ROSC admitted to the ICU intubated.  Postextubation had another PEA arrest 05/27/2023 with 2 rounds of CPR.  Echocardiogram showed reduced EF 20 to 25%.  Had normalization of EF 06/03/2023.  Course has been complicated by shock liver, DIC AKI, seizure.  Now has transitioned to general cardiology services.   Assessment & Plan .     Acute HFrEF Stress cardiomyopathy PEA arrest in the setting of seizures Echocardiogram 05/26/2023 normal EF 60 to 65% after first PEA arrest.  Second PEA arrest occurred on 05/26/2023 with EF 25%, severely reduced RV function.  Repeat echo on 05/26/2023 shows recovered EF 55 to 60%.  Appears to be progressing appropriately with improvement in renal and liver function.   Continues to have multiple seizure events yesterday on EEG, neuro following. Overall euvolemic with mild JVD.  Lasix  has been stopped. BP has improved along with electrolytes. Will not make any changes today given instability from a neuro standpoint but is still progressing appropriately otherwise.  Tachycardia may be due to reflex response from the hydralazine and or the seizures.   Still may consider spiro if we continue to have issues with hyperaldosteronism. Continue hydralazine 50mg  every 8, Imdur 30 mg.  Titrate GDMT once renal function stabilizes more.  Consider initiating BB after diureses and once more stable.   Pleural effusions 06/05/2023 noted to be moderate.  Repeat CXR shows no changes, stable for now.   AKI Likely ATN related to postarrest.  Previously normal renal function PTA.  Creatinine as high as 4.6.  Now downtrending appropriately.  Today 2.69>2.23>1.8.  Nephrology following.  Hypokalemia Has now normalized with aggressive supplementation and mag.   Shock liver Related to postarrest.  LFTs continue to improve.  RLE DVT On lovonox now with seizures.   Abuse/alcohol abuse Agreeable to cessation.  DIC  Required MTP.  Suspected secondary to shock liver.  Hematology following.  Seems stable now with no further bleeding.  For questions or updates, please contact [facility] Please consult [email or web address] for contact info under        Signed, Abagail Kitchens, PA-C

## 2023-06-08 NOTE — Assessment & Plan Note (Addendum)
S/p 4 units PRBC, 4 units FFP, and 2 units platelets and 1 unit of cryo. Hgb 12.9, plt 239. No evidence of active bleed.  - Continue Eliquis for 6 months per hematology.

## 2023-06-08 NOTE — Assessment & Plan Note (Signed)
2/2 cardiac arrest. Transaminases have been steadily downtrending. Abdominal exam benign today.

## 2023-06-08 NOTE — Progress Notes (Signed)
Occupational Therapy Treatment Patient Details Name: Timothy Hubbard MRN: [id number] DOB: Sep 07, 1990 Today's Date: 06/08/2023   History of present illness 32 y.o. male admitted 9/19 for seizures complicated by PEA cardiac arrest x 2, approximately 3 minutes total downtime. Pt with shock of mixed etiology, cardiogenic and septic due to aspiration PNA. Intubation 9/19-9/20, 9/21 3rd brief PEA arrest and re intubated 9/21-9/26. Course complicated by oliguric AKI, hemoperitoneum and acute anemia in setting of DIC, DVT in RLE. Seizure 10/02 now on continuous EEG. PMhx: seizures, HTN, asthma, mood disorder, substance abuse   OT comments  Pt with seizure yesterday, and reporting that his L hand will not work. Pt with trace activation of digits and no activation of wrist flexion/extension; diminished sensation pt reports "slightly"; 1 on Modified Ashworth with wrist mobility. Engaged pt in weightbearing through wrist with modified chair push ups with support at wrist joint from therapist and LUE AROM/PROM. Engaging in STS transfers as well with min A. Pt in bed pleasantly confused throughout reporting he is up cleaning his room on arrival and he would like to get back in bed. Per RN, pt's mother has requested inpatient rehab and at this time pt with good motivation and significant change in functional status. Will continue to follow.       If plan is discharge home, recommend the following:  A little help with walking and/or transfers;A little help with bathing/dressing/bathroom;Assistance with cooking/housework;Help with stairs or ramp for entrance;Assist for transportation;Direct supervision/assist for medications management;Direct supervision/assist for financial management;Supervision due to cognitive status   Equipment Recommendations  Other (comment) (defer next venue)    Recommendations for Other Services      Precautions / Restrictions Precautions Precautions: Other (comment) Precaution Comments:  impaired cognition Restrictions Weight Bearing Restrictions: No       Mobility Bed Mobility Overal bed mobility: Needs Assistance Bed Mobility: Supine to Sit, Sit to Supine     Supine to sit: Min assist, HOB elevated, Used rails Sit to supine: Min assist   General bed mobility comments: for directional cues and sequencing    Transfers Overall transfer level: Needs assistance   Transfers: Sit to/from Stand Sit to Stand: Min assist           General transfer comment: Min A at EOB with cues for hand placement.     Balance Overall balance assessment: Needs assistance Sitting-balance support: No upper extremity supported, Feet supported Sitting balance-Leahy Scale: Good Sitting balance - Comments: EOB with supervision                                   ADL either performed or assessed with clinical judgement   ADL Overall ADL's : Needs assistance/impaired Eating/Feeding: Set up;Sitting Eating/Feeding Details (indicate cue type and reason): taking medication at [employer] Transfer: Minimal assistance Toilet Transfer Details (indicate cue type and reason): STS this session; continuous EEG                Extremity/Trunk Assessment Upper Extremity Assessment Upper Extremity Assessment: Right hand dominant;RUE deficits/detail;LUE deficits/detail RUE Deficits / Details: generally weak, using functionally for BADL LUE Deficits / Details: LUE with new weakness after seizure yesterday (10/2) with no active wrist flexion/extension, radial/ulnar deviation, and trace activation of digit flexion. Poor GM coordination. Wrist flexion/extension 1-1+ on modified ashworth scale  LUE Sensation: decreased light touch LUE Coordination: decreased fine motor   Lower Extremity Assessment Lower Extremity Assessment: Defer to PT evaluation        Vision   Additional Comments: Pt reports he does not read at baseline; questionable visual  deficits needing further assessment secondary to when asked to read or state letters aloud starting in middle of board skipping first 3 lines   Perception     Praxis      Cognition Arousal: Alert Behavior During Therapy: Flat affect Overall Cognitive Status: Impaired/Different from baseline Area of Impairment: Attention, Memory, Following commands, Safety/judgement, Awareness, Problem solving                   Current Attention Level: Sustained Memory: Decreased short-term memory Following Commands: Follows one step commands consistently Safety/Judgement: Decreased awareness of deficits Awareness: Emergent Problem Solving: Slow processing, Requires verbal cues General Comments: Intermittently with verbalizations irrelevant to conversation and needing cues to sustain tasks at times. Many comments about getting in bed despite being in bed?        Exercises Exercises: Other exercises Other Exercises Other Exercises: LUE AROM shoulder flexion x10; elbow flexionx10, shoulder abduction x 10. PROM wrist/hand x10 Modified ashworth score of 1 with wrist flexion/extension    Shoulder Instructions       General Comments      Pertinent Vitals/ Pain       Pain Assessment Pain Assessment: No/denies pain  Home Living                                          Prior Functioning/Environment              Frequency  Min 1X/week        Progress Toward Goals  OT Goals(current goals can now be found in the care plan section)  Progress towards OT goals: Progressing toward goals  Acute Rehab OT Goals Patient Stated Goal: get better OT Goal Formulation: With patient Time For Goal Achievement: 06/17/23 Potential to Achieve Goals: Good ADL Goals Pt Will Perform Lower Body Bathing: with min assist;sitting/lateral leans;sit to/from stand Pt Will Perform Upper Body Dressing: with set-up;sitting Pt Will Perform Lower Body Dressing: with min  assist;sitting/lateral leans;sit to/from stand Pt Will Transfer to Toilet: with contact guard assist;ambulating;regular height toilet;grab bars Pt Will Perform Toileting - Clothing Manipulation and hygiene: sitting/lateral leans;sit to/from stand;with min assist Pt/caregiver will Perform Home Exercise Program: Increased strength;Both right and left upper extremity;With theraband;With theraputty;With Supervision;With written HEP provided  Plan      Co-evaluation                 AM-PAC OT "6 Clicks" Daily Activity     Outcome Measure   Help from another person eating meals?: None Help from another person taking care of personal grooming?: A Little Help from another person toileting, which includes using toliet, bedpan, or urinal?: A Lot Help from another person bathing (including washing, rinsing, drying)?: A Lot Help from another person to put on and taking off regular upper body clothing?: A Little Help from another person to put on and taking off regular lower body clothing?: A Lot 6 Click Score: 16    End of Session    OT Visit Diagnosis: Unsteadiness on feet (R26.81);Muscle weakness (generalized) (M62.81);Other (comment);Other symptoms and signs involving cognitive function   Activity Tolerance Patient tolerated treatment well;Patient  limited by fatigue   Patient Left in bed;with call bell/phone within reach;with bed alarm set   Nurse Communication Mobility status        Time: 2956-2130 OT Time Calculation (min): 22 min  Charges: OT General Charges $OT Visit: 1 Visit OT Treatments $Self Care/Home Management : 8-22 mins  Tyler Deis, OTR/L [facility] Acute Rehabilitation Office: [phone]   Myrla Halsted 06/08/2023, 5:45 PM

## 2023-06-08 NOTE — Assessment & Plan Note (Signed)
-   Currently on room air  - Diurese discussed below - Tylenol prn for rib pain.  -Consider further pain management as needed to prevent atelectasis.  - Tx asthma as below - Incentive spirometry + pulm toilet as indicated - PT OT for deconditioning

## 2023-06-08 NOTE — Assessment & Plan Note (Addendum)
Tonic-clonic seizure yesterday. - Long-term EEG in progress per neurology - Cont Keppra 500mg  BID - Seizure precautions - Neurology consulted, appreciate recs.  -AM BMP, magnesium

## 2023-06-08 NOTE — Progress Notes (Signed)
vLTM maintenance.  [facility] notified EEG lab that patient is pulling leads off his head.  [facility] notified nursing staff to go in patients room before pt removes more leads. .  Leads repaired.  FP1  FP2 open sores in area.  All impedances below 10kohms again.

## 2023-06-08 NOTE — TOC Progression Note (Addendum)
Transition of Care [facility]) - Progression Note    Patient Details  Name: Timothy Hubbard MRN: [id number] Date of Birth: August 26, 1991  [facility] CM/SW Contact  Lockie Pares, RN Phone Number: 06/08/2023, 11:45 AM  Clinical Narrative:     Patient having weakness in extremity MRI and CT done. Having seizures requiring ativan.  Will need to be on DOAC for 6 months for DVT. Will send for eligibility.  Disposition may need to be changed depending on  ongoing events.  TOC heart failure will continue to follow patient 1350 CSW met with mother. Followed up with mother after to explain home health, see if she has a agency. She states she wants to set up PCS services , call medicaid first. She expressed that last time the patient was here, things "moved too fast" and she wants to move slower and make sure he is stable before discharging home. She will circle back regarding agency choice after speaking with medicaid tomorrow. Requesting hospital bed, ordered with hoyer and trapeze.  Reached out to Medical team to discuss.  Expected Discharge Plan: Home w Home Health Services (primary health choice 2hrs daily) Barriers to Discharge: Continued Medical Work up  Expected Discharge Plan and Services       Living arrangements for the past 2 months: Single Family Home                                       Social Determinants of Health (SDOH) Interventions SDOH Screenings   Food Insecurity: No Food Insecurity (06/08/2023)  Housing: Low Risk  (06/08/2023)  Transportation Needs: No Transportation Needs (06/08/2023)  Utilities: Not At Risk (06/08/2023)  Alcohol Screen: Low Risk  (12/16/2022)  Depression (PHQ2-9): High Risk (05/22/2023)  Financial Resource Strain: Medium Risk (04/01/2022)  Stress: Stress Concern Present (01/05/2023)  Tobacco Use: High Risk (05/25/2023)    Readmission Risk Interventions     No data to display

## 2023-06-08 NOTE — Procedures (Addendum)
Patient Name: Timothy Hubbard  MRN: [id number]  Epilepsy Attending: Charlsie Quest  Referring Physician/Provider: Caryl Pina, MD  Duration: 06/07/2023 1305 to 06/08/2023 1645   Patient history: 32 year old male with history of seizures, hypertension and asthma who presented after had witnessed seizure. EEG to evaluate for seizure   Level of alertness: Awake, asleep   AEDs during EEG study: LEV   Technical aspects: This EEG study was done with scalp electrodes positioned according to the 10-20 International system of electrode placement. Electrical activity was reviewed with band pass filter of 1-70Hz , sensitivity of 7 uV/mm, display speed of 46mm/sec with a 60Hz  notched filter applied as appropriate. EEG data were recorded continuously and digitally stored.  Video monitoring was available and reviewed as appropriate.   Description: The posterior dominant rhythm consists of 8-9 Hz activity of moderate voltage (25-35 uV) seen predominantly in posterior head regions, symmetric and reactive to eye opening and eye closing.  Sleep was characterized by vertex waves, sleep spindles (12 to 14 Hz), maximal frontocentral region.  Sharp waves were noted in right frontocentral region, quasi periodic during sleep at 0.25 -1Hz .  Intermittent rhythmic 3 to 5 Hz sharply contoured theta-delta activity was also noted in frontocentral region, predominantly during sleep.  One seizure without clinical sign was noted on 06/07/2023 at 1452.  During seizure, EEG initially showed sharp waves in right frontocentral region admixed with 3 to 5 Hz theta-delta slowing with overriding 12 to 15 Hz beta activity which gradually evolved in frequency to 2 to 3 Hz and morphology appeared more sharply contoured.  Duration of seizure was about 18 seconds.  Five seizures were recorded on 06/07/2023 at 1732, 1827, 1905, 1949 and 2037.  At the onset of seizures patient initially had no clinical signs.  He then had left gaze deviation followed  by eye flutter and subtle head jerking.  Concomitant EEG showed sharp waves in right frontocentral region admixed with 3 to 5 Hz theta-delta slowing with overriding 12 to 15 Hz beta activity which gradually evolved in frequency to 2 to 3 Hz and morphology appeared more sharply contoured. Duration of seizure was 2-3 minutes.  EEG was disconnected between 06/07/2023 2045 to 06/08/2023 0017   ABNORMALITY - Focal seizure, right frontocentral region - Sharp waves, right frontocentral region   IMPRESSION: This study showed evidence of epileptogenicity arising from frontocentral region.   One seizure without clinical signs was noted on 06/07/2023 at 1452 arising from right frontocentral region lasting for about 18 seconds.  Five seizures were recorded on 06/07/2023 at 1732, 1827, 1905, 1949 and 2037 during which patient initially had no clinical signs. This was followed by left face deviation which then evolved into eye flutter as well as subtle head jerking.  Seizures were noted to be arising from right frontocentral region.  Average duration of seizures was about 2 to 3 minutes.  [name] [name]

## 2023-06-08 NOTE — Progress Notes (Addendum)
Neurology Progress Note   S:// Patient lying in the bed in no apparent distress.  No family at the bedside.  He is on LTM.  Patient states he is still unable to use his left hand LTM read this morning  O:// Current vital signs: BP 129/89 (BP Location: Right Arm)   Pulse 69   Temp 98.3 F (36.8 C) (Axillary)   Resp 18   Wt 75.7 kg   SpO2 95%   BMI 26.94 kg/m  Vital signs in last 24 hours: Temp:  [97.8 F (36.6 C)-98.8 F (37.1 C)] 98.3 F (36.8 C) (10/03 0430) Pulse Rate:  [49-124] 69 (10/03 0825) Resp:  [16-24] 18 (10/03 0825) BP: (125-185)/(82-147) 129/89 (10/03 0430) SpO2:  [90 %-100 %] 95 % (10/03 0430) Weight:  [75.7 kg] 75.7 kg (10/03 0981)  GENERAL: Awake, alert in NAD HEENT: - Normocephalic and atraumatic, dry mm LUNGS - Clear to auscultation bilaterally with no wheezes CV - S1S2 RRR, no m/r/g, equal pulses bilaterally. ABDOMEN - Soft, nontender, nondistended with normoactive BS Ext: warm, well perfused, intact peripheral pulses, no edema   NEURO:  Mental Status: AA&O to self, age,  year and place. For the month he stated "August" Language: Speech is clear.  Naming, repetition, fluency, and comprehension intact. Cranial Nerves: PERRL  EOMI, visual fields full, no facial asymmetry, facial sensation intact, hearing intact, tongue/uvula/soft palate midline, normal sternocleidomastoid and trapezius muscle strength. No evidence of tongue atrophy  Motor: Right upper arm 5/5,  Left upper extremity proximal strength-able to lift antigravity. Distal weakness in hand and wrist, and proximal at the shoulder 4/5.  Left hand with 0/5 grip.   Bilateral lower extremities 4/5 Tone: Decreased in LUE Sensation- Intact to light touch bilaterally Coordination: FTN intact bilaterally, no ataxia in BLE. Gait- deferred    Medications  Current Facility-Administered Medications:    0.9 %  sodium chloride infusion, 250 mL, Intravenous, Continuous, Cheri Fowler, MD, Stopped at  05/31/23 1438   0.9 %  sodium chloride infusion, , Intravenous, PRN, Cheri Fowler, MD, Stopped at 05/28/23 1959   acetaminophen (OFIRMEV) IV 650 mg, 650 mg, Intravenous, Q6H PRN, Mliss Sax, Sarah, DO   arformoterol (BROVANA) nebulizer solution 15 mcg, 15 mcg, Nebulization, BID, Lorin Glass, MD, 15 mcg at 06/08/23 0825   budesonide (PULMICORT) nebulizer solution 0.5 mg, 0.5 mg, Nebulization, BID, Lidia Collum, PA-C, 0.5 mg at 06/08/23 0825   docusate (COLACE) 50 MG/5ML liquid 100 mg, 100 mg, Oral, BID PRN, Lynnell Catalan, MD   doxepin (SINEQUAN) capsule 10 mg, 10 mg, Oral, QHS PRN, Jena Gauss, Allee, MD   enoxaparin (LOVENOX) injection 80 mg, 80 mg, Subcutaneous, BID, Hindel, Leah, MD   feeding supplement (BOOST / RESOURCE BREEZE) liquid 1 Container, 1 Container, Oral, TID BM, Mannam, Praveen, MD   folic acid (FOLVITE) tablet 1 mg, 1 mg, Oral, Daily, Maxwell, Allee, MD, 1 mg at 06/07/23 0945   hydrALAZINE (APRESOLINE) injection 50 mg, 50 mg, Intravenous, Q8H, Spence, Sarah, DO, 50 mg at 06/08/23 1914   ipratropium-albuterol (DUONEB) 0.5-2.5 (3) MG/3ML nebulizer solution 3 mL, 3 mL, Nebulization, Q4H PRN, Suzie Portela, John D, PA-C, 3 mL at 06/01/23 2031   isosorbide mononitrate (IMDUR) 24 hr tablet 30 mg, 30 mg, Oral, Daily, Laurey Morale, MD, 30 mg at 06/07/23 0945   levETIRAcetam (KEPPRA) IVPB 500 mg/100 mL premix, 500 mg, Intravenous, Q12H, Caryl Pina, MD, Last Rate: 400 mL/hr at 06/08/23 0026, 500 mg at 06/08/23 0026   lip balm (CARMEX) ointment, , Topical,  PRN, Lynnell Catalan, MD   LORazepam (ATIVAN) injection 2 mg, 2 mg, Intravenous, Q5 min PRN, Caryl Pina, MD   OLANZapine (ZYPREXA) injection 5 mg, 5 mg, Intramuscular, QHS, Spence, Sarah, DO, 5 mg at 06/08/23 0018   ondansetron (ZOFRAN) injection 4 mg, 4 mg, Intravenous, Q6H PRN, Cheri Fowler, MD   Oral care mouth rinse, 15 mL, Mouth Rinse, 4 times per day, Lynnell Catalan, MD, 15 mL at 06/07/23 1614   Oral care mouth rinse, 15 mL, Mouth  Rinse, PRN, Lynnell Catalan, MD   pantoprazole (PROTONIX) injection 40 mg, 40 mg, Intravenous, Q12H, Spence, Sarah, DO, 40 mg at 06/07/23 2240   phenylephrine-shark liver oil-mineral oil-petrolatum (PREPARATION H) rectal ointment 1 Application, 1 Application, Rectal, BID PRN, Evette Georges, MD   polyethylene glycol (MIRALAX / GLYCOLAX) packet 17 g, 17 g, Oral, Daily, Dahbura, Anton, DO   sodium chloride flush (NS) 0.9 % injection 10-40 mL, 10-40 mL, Intracatheter, Q12H, Lorin Glass, MD, 10 mL at 06/07/23 2249   sodium chloride flush (NS) 0.9 % injection 10-40 mL, 10-40 mL, Intracatheter, PRN, Lorin Glass, MD   thiamine (VITAMIN B1) tablet 100 mg, 100 mg, Oral, Daily, Agarwala, Ravi, MD, 100 mg at 06/07/23 0945   white petrolatum (VASELINE) gel, , Topical, PRN, Cheri Fowler, MD   witch hazel-glycerin (TUCKS) pad, , Topical, PRN, Shelby Mattocks, DO  Labs CBC    Component Value Date/Time   WBC 9.8 06/08/2023 0419   RBC 4.48 06/08/2023 0419   HGB 12.9 (L) 06/08/2023 0419   HGB 12.7 (L) 04/04/2023 1040   HCT 39.0 06/08/2023 0419   HCT 29.3 (L) 05/25/2023 1532   PLT 239 06/08/2023 0419   PLT 235 04/04/2023 1040   MCV 87.1 06/08/2023 0419   MCV 95 04/04/2023 1040   MCH 28.8 06/08/2023 0419   MCHC 33.1 06/08/2023 0419   RDW 20.2 (H) 06/08/2023 0419   RDW 11.4 (L) 04/04/2023 1040   LYMPHSABS 2.5 06/04/2023 1227   LYMPHSABS 3.4 (H) 04/04/2023 1040   MONOABS 2.0 (H) 06/04/2023 1227   EOSABS 0.0 06/04/2023 1227   EOSABS 0.0 04/04/2023 1040   BASOSABS 0.0 06/04/2023 1227   BASOSABS 0.0 04/04/2023 1040    CMP     Component Value Date/Time   NA 141 06/08/2023 0419   NA 139 05/22/2023 1213   K 3.7 06/08/2023 0419   CL 105 06/08/2023 0419   CO2 23 06/08/2023 0419   GLUCOSE 83 06/08/2023 0419   BUN 23 (H) 06/08/2023 0419   BUN 9 05/22/2023 1213   CREATININE 1.85 (H) 06/08/2023 0419   CREATININE 1.03 10/19/2016 1017   CALCIUM 8.3 (L) 06/08/2023 0419   PROT 5.3 (L) 06/06/2023  0458   PROT 5.8 (L) 02/13/2023 1725   ALBUMIN 2.6 (L) 06/07/2023 0412   ALBUMIN 3.5 (L) 02/13/2023 1725   AST 35 06/06/2023 0458   ALT 61 (H) 06/06/2023 0458   ALKPHOS 70 06/06/2023 0458   BILITOT 1.7 (H) 06/06/2023 0458   BILITOT <0.2 02/13/2023 1725   GFRNONAA 49 (L) 06/08/2023 0419   GFRNONAA >89 10/19/2016 1017   GFRAA 138 02/04/2020 1101   GFRAA >89 10/19/2016 1017    Lipid Panel     Component Value Date/Time   TRIG 66 05/31/2023 0400    Lab Results  Component Value Date   HGBA1C 5.7 (H) 05/26/2023     Imaging I have reviewed images in epic and the results pertinent to this consultation are:  CT scan of the brain:  New areas of cortical/subcortical hypoattenuation within both superior frontal lobes and the superior right parietal lobe, concerning for acute to subacute infarcts. ASPECTS is 8.  MRI examination of the brain- Multifocal large regions of diffuse/confluent cortical diffusion restriction within the frontal lobes with areas of edema also affecting the cingulate gyrus and left parietal lobe,  LTM EEG 10/3: This study showed evidence of epileptogenicity arising from frontocentral region. One seizure without clinical signs was noted on 06/07/2023 at 1452 arising from right frontocentral region lasting for about 18 seconds. Five seizures were recorded on 06/07/2023 at 1732, 1827, 1905, 1949 and 2037 during which patient initially had no clinical signs. This was followed by left face deviation which then evolved into eye flutter as well as subtle head jerking.  Seizures were noted to be arising from right frontocentral region.  Average duration of seizures was about 2 to 3 minutes.  Assessment:  32 year old male with a history of epilepsy who presented with breakthrough seizures c/b cardiac arrest x 3, each with ROSC after a short time interval. Neurology was re-consulted on Wednesday 10/2 for focal seizure recurrence followed by brief GTC activity and then focal motor status  epilepticus. Patient received multiple doses of ativan for seizure recurrence yesterday and then was noted to have left arm weakness. Code Stroke was activated with unclear LKW.  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  - Cortical edema may be secondary to his short episodes of cardiac arrest versus seizure-related edema or a combination thereof - Will need continued EEG monitoring for titration of Keppra  Recommendations: - Inpatient seizure precautions  - Increasing Keppra to 1000 mg BID IV  - Ativan PRN for seizures  - Continue LTM   Gevena Mart DNP, ACNPC-AG  [facility]  Electronically signed: Dr. Caryl Pina

## 2023-06-08 NOTE — Assessment & Plan Note (Addendum)
Likely 2/2 fluid overload. - Lasix as above

## 2023-06-08 NOTE — Assessment & Plan Note (Addendum)
Thought to be ATN 2/2 cardiac arrest. Cr as high as 4.6; today 1.85. ANCA workup negative. - Cont Lasix 80mg  bid - Nephrology consulted, appreciate recs - Strict I/O's - Daily weights - Avoid nephrotoxic drugs - Preferred narcotics for pain control will be hydromorphone, fentanyl, and methadone  -AM BMP, magnesium

## 2023-06-08 NOTE — Progress Notes (Signed)
Eeg rehooked. P4 PORT ON HEAD BOX MALFUNCTIONING. Study re-montaged to use oz instead.

## 2023-06-08 NOTE — Assessment & Plan Note (Addendum)
Per Heme/Onc, will need anticoag with Eliquis for 6 months.  -Was transition to Lovenox yesterday due to swallowing concerns; will restart Eliquis today -  Eliquis 5 mg BID dosing - AM CBC

## 2023-06-08 NOTE — TOC Progression Note (Signed)
Transition of Care [facility]) - Progression Note    Patient Details  Name: Timothy Hubbard MRN: [id number] Date of Birth: June 13, 1991  [facility] CM/SW Contact  Nicanor Bake Phone Number: [phone] 06/08/2023, 1:45 PM  Clinical Narrative:   HF CSW spoke with the pts mother over the phone. CSW called to see if the family would be interested in CIR or any form of rehab. Pts mother declined. Pts mother stated that she would like for him to come home and work with the nurses who have been working with him. CSW attempted to speak with pt at an earlier time but pt did not engage.   TOC will continue following.     Expected Discharge Plan: Home w Home Health Services (primary health choice 2hrs daily) Barriers to Discharge: Continued Medical Work up  Expected Discharge Plan and Services       Living arrangements for the past 2 months: Single Family Home                                       Social Determinants of Health (SDOH) Interventions SDOH Screenings   Food Insecurity: No Food Insecurity (06/08/2023)  Housing: Low Risk  (06/08/2023)  Transportation Needs: No Transportation Needs (06/08/2023)  Utilities: Not At Risk (06/08/2023)  Alcohol Screen: Low Risk  (12/16/2022)  Depression (PHQ2-9): High Risk (05/22/2023)  Financial Resource Strain: Medium Risk (04/01/2022)  Stress: Stress Concern Present (01/05/2023)  Tobacco Use: High Risk (05/25/2023)    Readmission Risk Interventions     No data to display

## 2023-06-08 NOTE — Assessment & Plan Note (Addendum)
Repeat echo on 05/26/2023 shows recovered EF 55 to 60%. - Cont Lasix for fluid overload  - Cont imdur 30mg  daily - Cardiology consulted, appreciate recs

## 2023-06-08 NOTE — Assessment & Plan Note (Signed)
S/p PEA arrest 9/19 and 9/21, now extubated, off pressors and transitioned out of ICU.  - Cardiology consulted, appreciate recs

## 2023-06-08 NOTE — Progress Notes (Signed)
FMTS Brief Progress Note  S: Went to patient bedside with Dr Royal Piedra. Pt states he is feeling well, upper extremity strength has greatly improved compared to last night. Pt states the neurologist came to see him earlier. Asking for a sprite.    O: BP (!) 141/91 (BP Location: Right Arm)   Pulse (!) 109   Temp 98.3 F (36.8 C) (Oral)   Resp 18   Wt 75.7 kg   SpO2 94%   BMI 26.94 kg/m    Gen: young man laying in bed with EEG leads on head, in NAD CV: tachycardic, regular rhythm, no murmurs, rubs, or gallops Pulm: CTAB, normal WOB on RA, no wheezes, rales, or rhonchi Abd: normoactive bowel sounds, soft, nontender, nondistended Ext: no edema to BLE Neuro: able to raise LUE to shoulder height. 0/5 grip strength in LUE, 5/5 grip strength in RUE. Speech much clearer vs yesterday. Psych: appropriate mood and affect  A/P: Seizure disorder -continue long term EEG -continue keppra 500mg  BID -seizure precautions: given ativan  -f/u neurology recommendations in AM - Orders reviewed. Labs for AM ordered, which was adjusted as needed.   Other contingency planning -If fevers: blood cx, CXR, vanc/zosyn -If resp distress: CXR, EKG, consult CCM  -If concern for DIC/bleeding: stat CBC, stop heparin drip, transfuse, consult CCM  Lorayne Bender, MD 06/08/2023, 8:26 PM PGY-1, [facility] Night Resident  Please page [phone] with questions.

## 2023-06-08 NOTE — Progress Notes (Signed)
[facility] Progress Note  Name: Timothy Hubbard MRN: [id number] DOB: 1991-09-03   Subjective:  He had 3 liters UOP over 10/2 as well as one unmeasured urine void.  He has been on continuous EEG monitoring.  Per his nurse he has had no observed seizure activity today.    Review of systems:   Denies shortness of breath  Denies n/v    Intake/Output Summary (Last 24 hours) at 06/08/2023 1639 Last data filed at 06/08/2023 0433 Gross per 24 hour  Intake 87.55 ml  Output 1350 ml  Net -1262.45 ml    Vitals:  Vitals:   06/08/23 0632 06/08/23 0825 06/08/23 0828 06/08/23 1138  BP:   (!) 138/91 (!) 128/99  Pulse:  69 75 99  Resp:  18 19 18   Temp:   98.2 F (36.8 C) 98.3 F (36.8 C)  TempSrc:   Oral Oral  SpO2:   95% 98%  Weight: 75.7 kg        Physical Exam:  General adult male in bed in no acute distress HEENT normocephalic atraumatic  Neck supple trachea midline Lungs clear to auscultation bilaterally normal work of breathing at rest; on room air Heart S1S2 no rub Abdomen soft nontender nondistended Extremities no edema appreciated Neuro - awake and interactive Psych - understandably frustrated     Medications reviewed   Labs:     Latest Ref Rng & Units 06/08/2023    4:19 AM 06/07/2023   11:59 AM 06/07/2023    4:12 AM  BMP  Glucose 70 - 99 mg/dL 83  87  97   BUN 6 - 20 mg/dL 23  28  29    Creatinine 0.61 - 1.24 mg/dL 4.09  8.11  9.14   Sodium 135 - 145 mmol/L 141  143  142   Potassium 3.5 - 5.1 mmol/L 3.7  3.3  2.9   Chloride 98 - 111 mmol/L 105  99  101   CO2 22 - 32 mmol/L 23  28  27    Calcium 8.9 - 10.3 mg/dL 8.3  8.6  8.5      Assessment/Plan:   # AKI  - Secondary to ATN in the setting of arrest - Improving with supportive care    Hypokalemia   - improved    Microscopic hematuria  - note that foley placement may explain  -reviewed serologic work-up which was thus far unrevealing including ANA neg. Sent ANCA for completion noting serum  complement and this was negative.   - would repeat UA and up/cr ratio on follow-up   Thrombocytopenia - noted on prior labs and has since resolved without aggressive intervention such as pheresis - No schistocytes.  Hem/onc was consulted earlier in his hospitalization and r/o'd TTP    HFrEF  - recovered EF per 06/03/23 TTE - LVEF 55-60% - moderate sized right and small left pleural effusions - cardiology is following    Seizures  - Per primary team and neurology   HTN  - improved control   Disposition - per primary team.   Nephrology will sign off.  Will request nephrology follow-up for 3-4 weeks from now.  Please do not hesitate to contact us with questions regarding the patient    Estanislado Emms, MD 06/08/2023 4:57 PM

## 2023-06-08 NOTE — Progress Notes (Signed)
LTM maint complete Notified by [facility] study wasn't viewable. Routed study to server.  [facility] monitored, Event button test confirmed by [facility].

## 2023-06-09 DIAGNOSIS — I469 Cardiac arrest, cause unspecified: Secondary | ICD-10-CM | POA: Diagnosis not present

## 2023-06-09 DIAGNOSIS — L89629 Pressure ulcer of left heel, unspecified stage: Secondary | ICD-10-CM | POA: Insufficient documentation

## 2023-06-09 DIAGNOSIS — R569 Unspecified convulsions: Secondary | ICD-10-CM | POA: Diagnosis not present

## 2023-06-09 LAB — CBC WITH DIFFERENTIAL/PLATELET
Abs Immature Granulocytes: 0.04 10*3/uL (ref 0.00–0.07)
Basophils Absolute: 0.1 10*3/uL (ref 0.0–0.1)
Basophils Relative: 1 %
Eosinophils Absolute: 0 10*3/uL (ref 0.0–0.5)
Eosinophils Relative: 0 %
HCT: 37.7 % — ABNORMAL LOW (ref 39.0–52.0)
Hemoglobin: 12.4 g/dL — ABNORMAL LOW (ref 13.0–17.0)
Immature Granulocytes: 0 %
Lymphocytes Relative: 29 %
Lymphs Abs: 2.6 10*3/uL (ref 0.7–4.0)
MCH: 29 pg (ref 26.0–34.0)
MCHC: 32.9 g/dL (ref 30.0–36.0)
MCV: 88.3 fL (ref 80.0–100.0)
Monocytes Absolute: 0.7 10*3/uL (ref 0.1–1.0)
Monocytes Relative: 8 %
Neutro Abs: 5.6 10*3/uL (ref 1.7–7.7)
Neutrophils Relative %: 62 %
Platelets: 248 10*3/uL (ref 150–400)
RBC: 4.27 MIL/uL (ref 4.22–5.81)
RDW: 20.1 % — ABNORMAL HIGH (ref 11.5–15.5)
WBC: 9 10*3/uL (ref 4.0–10.5)
nRBC: 0 % (ref 0.0–0.2)

## 2023-06-09 LAB — BASIC METABOLIC PANEL
Anion gap: 13 (ref 5–15)
BUN: 15 mg/dL (ref 6–20)
CO2: 19 mmol/L — ABNORMAL LOW (ref 22–32)
Calcium: 8.1 mg/dL — ABNORMAL LOW (ref 8.9–10.3)
Chloride: 107 mmol/L (ref 98–111)
Creatinine, Ser: 1.36 mg/dL — ABNORMAL HIGH (ref 0.61–1.24)
GFR, Estimated: >60 mL/min (ref >60)
Glucose, Bld: 86 mg/dL (ref 70–99)
Potassium: 3.3 mmol/L — ABNORMAL LOW (ref 3.5–5.1)
Sodium: 139 mmol/L (ref 135–145)

## 2023-06-09 LAB — MAGNESIUM: Magnesium: 2.2 mg/dL (ref 1.7–2.4)

## 2023-06-09 LAB — AMMONIA: Ammonia: 25 umol/L (ref 9–35)

## 2023-06-09 MED ORDER — POTASSIUM CHLORIDE 20 MEQ PO PACK
40.0000 meq | PACK | ORAL | Status: DC
  Filled 2023-06-09: qty 2

## 2023-06-09 MED ORDER — SPIRONOLACTONE 25 MG PO TABS
25.0000 mg | ORAL_TABLET | Freq: Every day | ORAL | Status: DC
  Administered 2023-06-09 – 2023-06-12 (×4): 25 mg via ORAL
  Filled 2023-06-09 (×4): qty 1

## 2023-06-09 MED ORDER — CARVEDILOL 3.125 MG PO TABS
3.1250 mg | ORAL_TABLET | Freq: Two times a day (BID) | ORAL | Status: DC
  Administered 2023-06-09 – 2023-06-12 (×6): 3.125 mg via ORAL
  Filled 2023-06-09 (×6): qty 1

## 2023-06-09 MED ORDER — POTASSIUM CHLORIDE CRYS ER 20 MEQ PO TBCR
40.0000 meq | EXTENDED_RELEASE_TABLET | Freq: Once | ORAL | Status: AC
  Administered 2023-06-09: 40 meq via ORAL
  Filled 2023-06-09: qty 2

## 2023-06-09 MED ORDER — OLANZAPINE 5 MG PO TABS
5.0000 mg | ORAL_TABLET | Freq: Every day | ORAL | Status: DC
  Administered 2023-06-09 – 2023-06-11 (×3): 5 mg via ORAL
  Filled 2023-06-09 (×4): qty 1

## 2023-06-09 MED ORDER — POTASSIUM CHLORIDE CRYS ER 20 MEQ PO TBCR
40.0000 meq | EXTENDED_RELEASE_TABLET | ORAL | Status: DC
  Filled 2023-06-09: qty 2

## 2023-06-09 MED ORDER — POTASSIUM CHLORIDE CRYS ER 20 MEQ PO TBCR: 40.0000 meq | EXTENDED_RELEASE_TABLET | Freq: Once | ORAL | Status: DC

## 2023-06-09 MED ORDER — APIXABAN 5 MG PO TABS
5.0000 mg | ORAL_TABLET | Freq: Two times a day (BID) | ORAL | Status: DC
  Administered 2023-06-09 – 2023-06-12 (×7): 5 mg via ORAL
  Filled 2023-06-09 (×7): qty 1

## 2023-06-09 NOTE — Procedures (Signed)
Patient Name: Timothy Hubbard  MRN: [id number]  Epilepsy Attending: Charlsie Quest  Referring Physician/Provider: Caryl Pina, MD  Duration: 06/08/2023 1645 to 06/09/2023 1645   Patient history: 32 year old male with history of seizures, hypertension and asthma who presented after had witnessed seizure. EEG to evaluate for seizure   Level of alertness: Awake, asleep   AEDs during EEG study: LEV   Technical aspects: This EEG study was done with scalp electrodes positioned according to the 10-20 International system of electrode placement. Electrical activity was reviewed with band pass filter of 1-70Hz , sensitivity of 7 uV/mm, display speed of 50mm/sec with a 60Hz  notched filter applied as appropriate. EEG data were recorded continuously and digitally stored.  Video monitoring was available and reviewed as appropriate.   Description: The posterior dominant rhythm consists of 8-9 Hz activity of moderate voltage (25-35 uV) seen predominantly in posterior head regions, symmetric and reactive to eye opening and eye closing.  Sleep was characterized by vertex waves, sleep spindles (12 to 14 Hz), maximal frontocentral region.  Sharp waves were noted in right frontocentral region, quasi periodic during sleep at 0.25 -1Hz .   ABNORMALITY - Sharp waves, right frontocentral region  IMPRESSION: This study showed evidence of epileptogenicity arising from frontocentral region. No further seizures were noted.   Suresh Audi [name]

## 2023-06-09 NOTE — Progress Notes (Addendum)
Neurology Progress Note   S:// Patient lying in the bed in no apparent distress.  Hospitalist at the bedside. Asked patient about his previous AEDS- he stated he was taken off of depakote at another hospital and we discussed that his kidney and liver function are improving. Additionally explained that he is now on Keppra which is taken twice a day. Patient became frustrated when we began discussing seizure precautions including drugs and alcohol and asked [place] to leave the room. He states that he does not use drugs anymore so we don't have to discuss this. I attempted to explain that this is what we have to discuss with everyone starting a new medication and he stated he wanted me to leave. Additionally he did not want to participate in a neuro exam.   AEDS:// Previous AEDS: Depakote 500mg  BID (03/2022-05/2023) - shock liver, admitted due to cardiac arrest Current AEDS: Keppra 1000mg  BID    O:// Current vital signs: BP (!) 143/86   Pulse (!) 105   Temp 97.7 F (36.5 C) (Oral)   Resp 18   Wt 74.3 kg   SpO2 94%   BMI 26.44 kg/m  Vital signs in last 24 hours: Temp:  [97.7 F (36.5 C)-98.8 F (37.1 C)] 97.7 F (36.5 C) (10/04 0409) Pulse Rate:  [87-109] 105 (10/04 0753) Resp:  [16-20] 18 (10/04 0753) BP: (128-160)/(86-116) 143/86 (10/04 0700) SpO2:  [94 %-98 %] 94 % (10/04 0753) Weight:  [74.3 kg] 74.3 kg (10/04 0409)  GENERAL: Awake, alert in NAD HEENT: - Normocephalic and atraumatic by casual observation    NEURO:  Mental Status: Awake and alert today. Speech comprehension and fluency are normal during initial interview, which was terminated by patient after he was asked about possible EtOH or illicit drug use at home.  Unable to perform remainder of exam due to patient non-compliance.   Medications  Current Facility-Administered Medications:    0.9 %  sodium chloride infusion, 250 mL, Intravenous, Continuous, Chand, Garnet Sierras, MD, Stopped at 05/31/23 1438   0.9 %  sodium chloride  infusion, , Intravenous, PRN, Cheri Fowler, MD, Last Rate: 10 mL/hr at 06/08/23 1212, New Bag at 06/08/23 1212   acetaminophen (TYLENOL) tablet 650 mg, 650 mg, Oral, Q6H PRN, [name], DO, 650 mg at 06/08/23 1131   apixaban (ELIQUIS) tablet 5 mg, 5 mg, Oral, BID, Spence, Sarah, DO   arformoterol [facility]) nebulizer solution 15 mcg, 15 mcg, Nebulization, BID, Lorin Glass, MD, 15 mcg at 06/09/23 0749   budesonide (PULMICORT) nebulizer solution 0.5 mg, 0.5 mg, Nebulization, BID, Lidia Collum, PA-C, 0.5 mg at 06/09/23 0749   docusate (COLACE) 50 MG/5ML liquid 100 mg, 100 mg, Oral, BID PRN, Lynnell Catalan, MD   doxepin (SINEQUAN) capsule 10 mg, 10 mg, Oral, QHS PRN, Jena Gauss, Allee, MD   feeding supplement (BOOST / RESOURCE BREEZE) liquid 1 Container, 1 Container, Oral, TID BM, Mannam, Praveen, MD   folic acid (FOLVITE) tablet 1 mg, 1 mg, Oral, Daily, Maxwell, Allee, MD, 1 mg at 06/08/23 1011   hydrALAZINE (APRESOLINE) tablet 50 mg, 50 mg, Oral, Q8H, Dameron, Marisa, DO, 50 mg at 06/09/23 0700   ipratropium-albuterol (DUONEB) 0.5-2.5 (3) MG/3ML nebulizer solution 3 mL, 3 mL, Nebulization, Q4H PRN, Suzie Portela, John D, PA-C, 3 mL at 06/01/23 2031   isosorbide mononitrate (IMDUR) 24 hr tablet 30 mg, 30 mg, Oral, Daily, Laurey Morale, MD, 30 mg at 06/08/23 1013   levETIRAcetam (KEPPRA) IVPB 1000 mg/100 mL premix, 1,000 mg, Intravenous, Q12H, Stevin Bielinski,  Minerva Areola, MD, Last Rate: 400 mL/hr at 06/09/23 0500, Infusion Verify at 06/09/23 0500   lip balm (CARMEX) ointment, , Topical, PRN, Agarwala, Ravi, MD   LORazepam (ATIVAN) injection 2 mg, 2 mg, Intravenous, Q5 min PRN, Caryl Pina, MD   OLANZapine (ZYPREXA) tablet 5 mg, 5 mg, Oral, QHS, Spence, Sarah, DO   ondansetron (ZOFRAN) tablet 4 mg, 4 mg, Oral, Q8H PRN, Darral Dash, DO   Oral care mouth rinse, 15 mL, Mouth Rinse, 4 times per day, Lynnell Catalan, MD, 15 mL at 06/08/23 1712   Oral care mouth rinse, 15 mL, Mouth Rinse, PRN, Agarwala, Ravi, MD    pantoprazole (PROTONIX) EC tablet 40 mg, 40 mg, Oral, Q12H, Dameron, Marisa, DO, 40 mg at 06/08/23 2204   phenylephrine-shark liver oil-mineral oil-petrolatum (PREPARATION H) rectal ointment 1 Application, 1 Application, Rectal, BID PRN, Evette Georges, MD   polyethylene glycol (MIRALAX / GLYCOLAX) packet 17 g, 17 g, Oral, Daily, Dahbura, Anton, DO   sodium chloride flush (NS) 0.9 % injection 10-40 mL, 10-40 mL, Intracatheter, Q12H, Lorin Glass, MD, 10 mL at 06/08/23 2212   sodium chloride flush (NS) 0.9 % injection 10-40 mL, 10-40 mL, Intracatheter, PRN, Lorin Glass, MD   thiamine (VITAMIN B1) tablet 100 mg, 100 mg, Oral, Daily, Agarwala, Ravi, MD, 100 mg at 06/08/23 1012   white petrolatum (VASELINE) gel, , Topical, PRN, Cheri Fowler, MD   witch hazel-glycerin (TUCKS) pad, , Topical, PRN, Shelby Mattocks, DO  Labs CBC    Component Value Date/Time   WBC 9.0 06/09/2023 0801   RBC 4.27 06/09/2023 0801   HGB 12.4 (L) 06/09/2023 0801   HGB 12.7 (L) 04/04/2023 1040   HCT 37.7 (L) 06/09/2023 0801   HCT 29.3 (L) 05/25/2023 1532   PLT 248 06/09/2023 0801   PLT 235 04/04/2023 1040   MCV 88.3 06/09/2023 0801   MCV 95 04/04/2023 1040   MCH 29.0 06/09/2023 0801   MCHC 32.9 06/09/2023 0801   RDW 20.1 (H) 06/09/2023 0801   RDW 11.4 (L) 04/04/2023 1040   LYMPHSABS 2.6 06/09/2023 0801   LYMPHSABS 3.4 (H) 04/04/2023 1040   MONOABS 0.7 06/09/2023 0801   EOSABS 0.0 06/09/2023 0801   EOSABS 0.0 04/04/2023 1040   BASOSABS 0.1 06/09/2023 0801   BASOSABS 0.0 04/04/2023 1040    CMP     Component Value Date/Time   NA 141 06/08/2023 0419   NA 139 05/22/2023 1213   K 3.7 06/08/2023 0419   CL 105 06/08/2023 0419   CO2 23 06/08/2023 0419   GLUCOSE 83 06/08/2023 0419   BUN 23 (H) 06/08/2023 0419   BUN 9 05/22/2023 1213   CREATININE 1.85 (H) 06/08/2023 0419   CREATININE 1.03 10/19/2016 1017   CALCIUM 8.3 (L) 06/08/2023 0419   PROT 5.3 (L) 06/06/2023 0458   PROT 5.8 (L) 02/13/2023 1725    ALBUMIN 2.6 (L) 06/07/2023 0412   ALBUMIN 3.5 (L) 02/13/2023 1725   AST 35 06/06/2023 0458   ALT 61 (H) 06/06/2023 0458   ALKPHOS 70 06/06/2023 0458   BILITOT 1.7 (H) 06/06/2023 0458   BILITOT <0.2 02/13/2023 1725   GFRNONAA 49 (L) 06/08/2023 0419   GFRNONAA >89 10/19/2016 1017   GFRAA 138 02/04/2020 1101   GFRAA >89 10/19/2016 1017    Lipid Panel     Component Value Date/Time   TRIG 66 05/31/2023 0400    Lab Results  Component Value Date   HGBA1C 5.7 (H) 05/26/2023     Imaging I have  reviewed images in epic and the results pertinent to this consultation are:  CT scan of the brain: New areas of cortical/subcortical hypoattenuation within both superior frontal lobes and the superior right parietal lobe, concerning for acute to subacute infarcts. ASPECTS is 8.  MRI examination of the brain- Multifocal large regions of diffuse/confluent cortical diffusion restriction within the frontal lobes with areas of edema also affecting the cingulate gyrus and left parietal lobe,  LTM EEG 06/07/2023 1305 to 06/08/2023 1645:  This study showed evidence of epileptogenicity arising from frontocentral region. One seizure without clinical signs was noted on 06/07/2023 at 1452 arising from right frontocentral region lasting for about 18 seconds. Five seizures were recorded on 06/07/2023 at 1732, 1827, 1905, 1949 and 2037 during which patient initially had no clinical signs. This was followed by left face deviation which then evolved into eye flutter as well as subtle head jerking.  Seizures were noted to be arising from right frontocentral region.  Average duration of seizures was about 2 to 3 minutes.  EEG: 06/08/2023 1645 to 06/09/2023 0900  ABNORMALITY: - Sharp waves, right frontocentral region IMPRESSION: This study showed evidence of epileptogenicity arising from frontocentral region. No further seizures were noted.  Assessment:  32 year old male with a history of epilepsy who presented with  breakthrough seizures c/b cardiac arrest x 3, each with ROSC after a short time interval. Neurology was re-consulted on Wednesday 10/2 for focal seizure recurrence followed by brief GTC activity and then focal motor status epilepticus. Patient received multiple doses of ativan for seizure recurrence and then was noted to have left arm weakness.  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  - Unable to complete neurological exam today (Friday) due to patient being uncooperative. Of note, he is now awake and fully alert, a significant improvement since yesterday.  - Cortical edema may be secondary to his short episodes of cardiac arrest versus seizure-related edema or a combination thereof - Will need continued EEG monitoring and kidney functions for titration of Keppra - Continue Keppra 1000mg  BID.  - If his renal function worsens, will adjust as needed for renal function:  Estimated Creatinine Clearance: 51.3 mL/min (A) (by C-G formula based on SCr of 1.85 mg/dL (H)).   CrCl 80 to 130 mL/minute/1.73 m2: 500 mg to 1.5 g every 12 hours.  CrCl 50 to <80 mL/minute/1.73 m2: 500 mg to 1 g every 12 hours.  CrCl 30 to <50 mL/minute/1.73 m2: 250 to 750 mg every 12 hours.  CrCl 15 to <30 mL/minute/1.73 m2: 250 to 500 mg every 12 hours.  CrCl <15 mL/minute/1.73 m2: 250 to 500 mg every 24 hours (expert opinion). - Cardiac: Per Cardiology, there is no indication for monitor or pacemaker in this patient with PEA arrest most likely provoked by seizure. Has had no other issues on telemetry. - Per Dr. Melynda Ripple, compliance has been an issue with him and due to history of high levels of EtOH use, it might be hard to give him too much rescue meds but would ideally prescribe rescue meds and tell family to keep one with patient and rest of the prescription with family to avoid abuse. Will address this  with the patient tomorrow.    Recommendations: - Inpatient seizure precautions  - Continue Keppra at 1000 mg BID - Ativan PRN for seizures  - Continue LTM. If EEG shows no seizures  for a second day, most likely will discontinue LTM EEG tomorrow.   Patient seen and examined by NP/APP Elmer Picker, DNP, FNP-BC [facility] Pager: [phone]   Electronically signed: Dr. Caryl Pina

## 2023-06-09 NOTE — Assessment & Plan Note (Addendum)
K 3.3 this AM. - replete with 80 mEq and recheck tomorrow AM - Cardiology has d/c'd Lasix; hope K will begin to improve off of this medication - Cardiology questions possible hyperaldosteronism with persistently low K and elevated BPs. Will consider adding spironolactone once renal fxn improves.

## 2023-06-09 NOTE — Progress Notes (Signed)
Pt has refused multiple attempts at administering potassium today. RN was told by MD to call pt mother to have her help encourage him to take the potassium, but this only made the pt become agitated and is now refusing all meds and care from this RN. MD aware.

## 2023-06-09 NOTE — Assessment & Plan Note (Signed)
-   Cont pulmicort and brovana - Duonebs q4h prn

## 2023-06-09 NOTE — Assessment & Plan Note (Addendum)
-   Currently on room air  - Diurese discussed below - Tylenol prn for rib pain.  -Consider further pain management as needed to prevent atelectasis.  - Tx asthma as below - Incentive spirometry + pulm toilet as indicated - PT OT for deconditioning

## 2023-06-09 NOTE — Assessment & Plan Note (Addendum)
Thought to be ATN 2/2 cardiac arrest. Cr as high as 4.6; continues to downtrend, today 1.36. ANCA workup negative. Lasix d/c'd by cardiology. - Nephrology consulted, appreciate recs - Strict I/O's - Daily weights - Avoid nephrotoxic drugs - Preferred narcotics for pain control will be hydromorphone, fentanyl, and methadone  - AM BMP, magnesium - will monitor Cr for titration of Keppra per neurology

## 2023-06-09 NOTE — Assessment & Plan Note (Signed)
Repeat echo on 05/26/2023 shows recovered EF 55 to 60%. - Cont imdur 30mg  daily - Cardiology consulted, appreciate recs

## 2023-06-09 NOTE — Progress Notes (Signed)
FMTS Brief Progress Note  S: Went to bedside with Dr Royal Piedra. Pt sleeping peacefully.   O: BP (!) 141/92 (BP Location: Right Arm)   Pulse 92   Temp 97.9 F (36.6 C) (Oral)   Resp 18   Wt 74.3 kg   SpO2 94%   BMI 26.44 kg/m    Gen: pt sleeping peacefully in hospital bed  A/P: Seizure disorder Tonic-clonic seizure 10/2. No further seizures on EEG overnight 10/3-10/4. -Neuro recommendations as follows -keppra increased to 1000mg  BID -ativan PRN for seizures -continue long term EEG, likely d/c tomorrow -seizure precautions -AM BMP, Mg  Hypokalemia K 3.3. this AM, pt refused K supplementation this AM but took tablet this evening - K at 10pm -AM BMP   - Orders reviewed. Labs for AM ordered, which was adjusted as needed.  - If condition changes, plan includes: -If fever: blood cx, vanc/zosyn -If resp distress: CXR, EKG, consult CCM -Sz plan as above -If concern for DIC/bleeding: stat CBC, stop AC, transfuse, consult CCM   Lorayne Bender, MD 06/09/2023, 9:09 PM PGY-1, [facility] Night Resident  Please page [phone] with questions.

## 2023-06-09 NOTE — Assessment & Plan Note (Addendum)
2/2 cardiac arrest. Transaminases have been steadily downtrending. Abdominal exam continues to be benign.

## 2023-06-09 NOTE — Assessment & Plan Note (Addendum)
Initially noted 10/3. - Wound care consulted, appreciate recs - wrapped this AM; RN to continue care per [facility] instructions

## 2023-06-09 NOTE — Plan of Care (Signed)
Note that this patient's nephrology follow-up appointment is at [facility] 07/18/23 at 8:20 am with Dr. Valentino Nose.    Estanislado Emms, MD 12:21 PM 06/09/2023

## 2023-06-09 NOTE — Assessment & Plan Note (Signed)
Likely 2/2 fluid overload.

## 2023-06-09 NOTE — TOC Initial Note (Addendum)
Transition of Care [facility]) - Initial/Assessment Note    Patient Details  Name: Timothy Hubbard MRN: [id number] Date of Birth: 20-Dec-1990  Transition of Care [facility]) CM/SW Contact:    Elliot Cousin, RN Phone Number: [phone] 06/09/2023, 11:51 AM  Clinical Narrative:  CM spoke to pt and she requested [facility] for [facility]. States she will need bedside commode, Hospital bed, Wheelchair and shower chair. States she wants [facility] for PCS. Will fax PCS form to the PCP's office. States she prefers he goes to [facility] rehab prior to [employer] home. Contacted [facility] rep, Kandee Keen with new referral.         Pt wants to go to IP rehab prior to dc home. IP rehab consult pending.             Expected Discharge Plan: Home w Home Health Services (primary health choice 2hrs daily) Barriers to Discharge: Continued Medical Work up   Patient Goals and CMS Choice Patient states their goals for this hospitalization and ongoing recovery are:: wants to recover CMS Medicare.gov Compare Post Acute Care list provided to:: Patient Represenative (must comment) Choice offered to / list presented to : Parent      Expected Discharge Plan and Services   Discharge Planning Services: CM Consult Post Acute Care Choice: IP Rehab Living arrangements for the past 2 months: Single Family Home                           HH Arranged: RN, PT, OT [facility] Agency: [facility] Date [facility] Agency Contacted: 06/09/23 Time HH Agency Contacted: [phone] Representative spoke with at [facility] Agency: Lorenza Chick  Prior Living Arrangements/Services Living arrangements for the past 2 months: Single Family Home Lives with:: Parents, Siblings Patient language and need for interpreter reviewed:: Yes Do you feel safe going back to the place where you live?: Yes      Need for Family Participation in Patient Care: Yes (Comment) Care giver support system in place?: Yes (comment)   Criminal Activity/Legal Involvement Pertinent to Current  Situation/Hospitalization: No - Comment as needed  Activities of Daily Living   ADL Screening (condition at time of admission) Independently performs ADLs?: Yes (appropriate for developmental age)  Permission Sought/Granted Permission sought to share information with : Case Manager, Family Supports, PCP Permission granted to share information with : Yes, Verbal Permission Granted  Share Information with NAME: Bella Kennedy  Permission granted to share info w AGENCY: IP rehab, Home Health, DME  Permission granted to share info w Relationship: mother  Permission granted to share info w Contact Information: [phone]  Emotional Assessment Appearance:: Appears older than stated age Attitude/Demeanor/Rapport: Engaged Affect (typically observed): Appropriate Orientation: : Oriented to Self, Oriented to Place, Oriented to  Time, Oriented to Situation Alcohol / Substance Use: Not Applicable Psych Involvement: No (comment)  Admission diagnosis:  Cardiac arrest (HCC) [I46.9] Lactic acidosis [E87.20] Seizure (HCC) [R56.9] Patient Active Problem List   Diagnosis Date Noted   Pressure ulcer of left heel 06/09/2023   Acute deep vein thrombosis (DVT) of right peroneal vein (HCC) 06/03/2023   AKI (acute kidney injury) (HCC) 06/03/2023   Acute HFrEF (heart failure with reduced ejection fraction) (HCC) 06/03/2023   Pleural effusion, right 06/03/2023   Coagulopathy (HCC) 06/03/2023   Shock liver 06/03/2023   Lactic acidosis 05/26/2023   Cardiac arrest (HCC) 05/25/2023   Acute respiratory failure with hypercapnia (HCC) 05/25/2023   Back pain 05/22/2023  Abnormal weight loss 05/22/2023   Hypokalemia 04/22/2023   Prolonged QT interval 04/22/2023   Oral candidiasis 04/22/2023   Bilateral lower extremity edema 02/14/2023   Benzodiazepine abuse, 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   History of cocaine abuse (HCC) 12/25/2022   Tachycardia 09/10/2022   Post-nasal drip 07/15/2022   Cognitive  impairment 07/15/2022   Impaired instrumental activities of daily living (IADL) 07/15/2022   Ambulatory dysfunction 04/06/2022   Mood disorder (HCC) 03/31/2022   Grade III internal hemorrhoids    Iron deficiency anemia 03/04/2022   Seizure disorder (HCC)    Alcohol use disorder 10/21/2016   Gastroesophageal reflux disease 06/13/2016   Hypertension 04/01/2016   Asthma 04/01/2016   TOBACCO USER 06/13/2009   PCP:  Vonna Drafts, MD Pharmacy:   [facility] [address] - [place], [place] - [address] [facility] [address] [place] [address] Phone: [phone] Fax: [phone]  [facility] - [place], [place] - [address] [place] [address] Phone: [phone] Fax: [phone]  [id number] - [place], [place] - [address] AT [facility] [address] [place] [address] Phone: [phone] Fax: [phone]     Social Determinants of Health (SDOH) Social History: SDOH Screenings   Food Insecurity: No Food Insecurity (06/08/2023)  Housing: Low Risk  (06/08/2023)  Transportation Needs: No Transportation Needs (06/08/2023)  Utilities: Not At Risk (06/08/2023)  Alcohol Screen: Low Risk  (12/16/2022)  Depression (PHQ2-9): High Risk (05/22/2023)  Financial Resource Strain: Medium Risk (04/01/2022)  Stress: Stress Concern Present (01/05/2023)  Tobacco Use: High Risk (05/25/2023)   SDOH Interventions:     Readmission Risk Interventions     No data to display

## 2023-06-09 NOTE — Progress Notes (Signed)
Daily Progress Note Intern Pager: [phone]  Patient name: Timothy Hubbard Medical record number: [id number] Date of birth: [birth year] Age: 32 y.o. Gender: male  Primary Care Provider: Vonna Drafts, MD Consultants: Cardiology/Heart Failure, Nephrology (signed off 10/3), Oncology (signed off 10/1) Code Status: FULL   Pt Overview and Major Events to Date:  9/19 2 PEA arrests ROSC achieved, admit to ICU; seizure and cardiac arrest 9/20 extubated 05/27/2023: PEA arrest and after woke up and was confused.  Total of 2 rounds of CPR with epi and bicarb x 1.  But became bradycardic > intubated.  Had A-line placed.  Also central line ascites tap did show blood.  Was given 4 units PRBC, 4 units FFP and 2 units platelets and 1 unit of cryo major transfusion protocol Cc consult for hemoperitoneum.  DIC panel positive.  He had some oozing from mouth and IV sites EEG showed symmetric low voltage no/seizure activity 06/01/2023: Extubated 06/03/2023: Transferred to FMTS 06/05/2023: Transitioning to peripheral IV, switching heparin to oral anticoagulant (eliquis)  06/07/2023: Apparent tonic-clonic seizure 06/08/2023: Long-term EEG; passed SLP swallow eval and return to regular diet  Assessment and Plan: Timothy Hubbard. Timothy Hubbard is a 32 year old male with PMH of seizures, hypertension and asthma who presented after witnessed seizure with PEA arrest x 2 who was intubated and transferred to ICU 9/19 and out to FMTS on 9/28.   Patient remains on long-term EEG.  Most recent read shows evidence of epileptogenicity, but no further seizure activity.  During prior hospitalizations it sounds as though seizures were thought to be related to alcohol withdrawal at the time; it is possible with these new developments that patient has true epilepsy.  Will continue to follow with neurology for recommendations for management during hospitalization.  Additionally, patient and family very interested in inpatient rehab (CIR).  Will work  to facilitate this when patient is medically stable for such. Assessment & Plan Seizure disorder (HCC) Tonic-clonic seizure 10/2. LTM EEG with evidence of epileptogenicity arising from frontocentral region. - Neurology consulted, appreciate recs.  - Long-term EEG in progress - Increase Keppra to 1000mg  BID per neurology - Seizure precautions - AM BMP, magnesium Hypokalemia K 3.3 this AM. - replete with 80 mEq and recheck tomorrow AM - Cardiology has d/c'd Lasix; hope K will begin to improve off of this medication - Cardiology questions possible hyperaldosteronism with persistently low K and elevated BPs. Will consider adding spironolactone once renal fxn improves. AKI (acute kidney injury) (HCC) Thought to be ATN 2/2 cardiac arrest. Cr as high as 4.6; continues to downtrend, today 1.36. ANCA workup negative. Lasix d/c'd by cardiology. - Nephrology consulted, appreciate recs - Strict I/O's - Daily weights - Avoid nephrotoxic drugs - Preferred narcotics for pain control will be hydromorphone, fentanyl, and methadone  - AM BMP, magnesium - will monitor Cr for titration of Keppra per neurology Acute deep vein thrombosis (DVT) of right peroneal vein (HCC) Per Heme/Onc, will need anticoag with Eliquis for 6 months.  -Was transitioned to Lovenox due to swallowing concerns; will restart Eliquis today. -  Eliquis 5 mg BID dosing - AM CBC  Coagulopathy (HCC) S/p 4 units PRBC, 4 units FFP, and 2 units platelets and 1 unit of cryo. Hgb 12.4, plt 248. No evidence of active bleed.  - Continue Eliquis for 6 months per hematology. Hypertension - Cont IV Hydralazine 50 mg q8, per Cards - Holding home amlodipine, lisinopril  Pressure ulcer of left heel Initially noted 10/3. - Wound care  consulted, appreciate recs - wrapped this AM; RN to continue care per [facility] instructions Acute HFrEF (heart failure with reduced ejection fraction) (HCC) Repeat echo on 05/26/2023 shows recovered EF 55 to 60%. -  Cont imdur 30mg  daily - Cardiology consulted, appreciate recs Acute respiratory failure with hypercapnia (HCC) - Currently on room air  - Diurese discussed below - Tylenol prn for rib pain.  -Consider further pain management as needed to prevent atelectasis.  - Tx asthma as below - Incentive spirometry + pulm toilet as indicated - PT/OT for deconditioning Cardiac arrest (HCC) S/p PEA arrest 9/19 and 9/21, now extubated, off pressors and transitioned out of ICU. - Cardiology consulted, appreciate recs Asthma - Cont pulmicort and brovana - Duonebs q4h prn Pleural effusion, right Likely 2/2 fluid overload. Shock liver 2/2 cardiac arrest. Transaminases have been steadily downtrending. Abdominal exam continues to be benign.   Chronic and Stable Problems:  Mood disorder: olanzapine 5 mg nightly   FEN/GI: Regular diet, Boost 3 times daily PPx: Eliquis Dispo: SNF per PT/OT recommendations; Awaiting placement.  Ongoing workup for new seizure event/hypoxic ischemic injury.  Subjective:  Pt seen this AM resting in bed with parents at bedside. Pt occasionally tearful as he discusses all that he has been through; mother is very encouraging and supportive. She states she would like for pt to go to inpatient rehab before he goes home, and pt ultimately agrees.  Pt questions if his L arm weakness will ever improve. Reports good appetite, denies abdominal pain. Denies any new onset areas of weakness. Denies pain to L heel where pressure ulcer was noted by RN.  Objective: Temp:  [97.7 F (36.5 C)-98.8 F (37.1 C)] 97.7 F (36.5 C) (10/04 0920) Pulse Rate:  [87-109] 94 (10/04 0921) Resp:  [16-20] 18 (10/04 0920) BP: (128-174)/(86-118) 174/118 (10/04 0921) SpO2:  [94 %-98 %] 96 % (10/04 0921) Weight:  [74.3 kg] 74.3 kg (10/04 0409) Physical Exam: General: Lying in bed with EEG leads in place on head.  Somewhat tearful during conversation, but in no acute distress. Cardiovascular:  Tachycardic, regular rhythm.  No murmurs. Respiratory: CTA bilaterally, no increased work of breathing on room air. Abdomen: Soft, nontender, nondistended.  No guarding.  No masses. Extremities: Left heel wrapped.  Dressing is clean and dry. Neuro: CN II: PERRL CN III, IV,VI: EOMI CVII: Symmetric smile and brow raise CN XI: 5/5 shoulder shrug Left upper extremity grip strength 0/5.  Can raise left upper extremity to shoulder height, strength 3/5.  Right upper extremity strength 5/5 Bilateral LE strength 5/5 Normal sensation in UE and LE bilaterally. Speech remains difficult to understand.   Laboratory: Most recent CBC Lab Results  Component Value Date   WBC 9.0 06/09/2023   HGB 12.4 (L) 06/09/2023   HCT 37.7 (L) 06/09/2023   MCV 88.3 06/09/2023   PLT 248 06/09/2023   Most recent BMP    Latest Ref Rng & Units 06/09/2023    8:01 AM  BMP  Glucose 70 - 99 mg/dL 86   BUN 6 - 20 mg/dL 15   Creatinine 1.91 - 1.24 mg/dL 4.78   Sodium 295 - 621 mmol/L 139   Potassium 3.5 - 5.1 mmol/L 3.3   Chloride 98 - 111 mmol/L 107   CO2 22 - 32 mmol/L 19   Calcium 8.9 - 10.3 mg/dL 8.1     [name], DO 06/09/2023, 10:59 AM  PGY-1, [facility] FPTS Intern pager: [phone], text pages welcome Secure chat group [facility]  [facility] Teaching Service

## 2023-06-09 NOTE — Consult Note (Signed)
WOC Nurse Consult Note: Reason for Consult: deep tissue injury heel  Wound type: Deep Tissue Pressure Injury Left lateral heel  Pressure Injury POA: no  Measurement: 5 cm x 5 cm  Wound bed: intact purple maroon discoloration  Drainage (amount, consistency, odor) none Periwound: intact  Dressing procedure/placement/frequency: Cleanse L heel with soap and water, dry and place Xeroform gauze [place] [phone]) to area of purple discoloration daily. Cover with dry gauze and wrap with Kerlix roll gauze. L foot should be placed in Prevalon boot while patient is in bed to offload pressure.   POC discussed with patient and bedside nurse.  WOC team will follow every 7-10 days to assess.   Thank you,    Priscella Mann MSN, RN-BC, [employer] [phone]

## 2023-06-09 NOTE — Assessment & Plan Note (Addendum)
Tonic-clonic seizure 10/2. LTM EEG with evidence of epileptogenicity arising from frontocentral region. - Neurology consulted, appreciate recs.  - Long-term EEG in progress - Increase Keppra to 1000mg  BID per neurology - Seizure precautions - AM BMP, magnesium

## 2023-06-09 NOTE — Assessment & Plan Note (Signed)
-   Cont IV Hydralazine 50 mg q8, per Cards - Holding home amlodipine, lisinopril

## 2023-06-09 NOTE — Progress Notes (Addendum)
Patient Name: Timothy Hubbard Date of Encounter: 06/09/2023 [facility] Cardiologist: None   Interval Summary  .    Does not appear that he has had any seizures yesterday.  He has motor deficits in his left upper extremity that he feels related to IV placement rather than his seizures.  Tried to discuss with patient likely due to his seizures however is very adamant that it was due to wrong IV administration.  He is without any other complaints.  Vital Signs .    Vitals:   06/08/23 2337 06/09/23 0409 06/09/23 0700 06/09/23 0753  BP: (!) 140/95 (!) 141/94 (!) 143/86   Pulse: 97 95  (!) 105  Resp: 18 16  18   Temp: 98.8 F (37.1 C) 97.7 F (36.5 C)    TempSrc: Oral Oral    SpO2: 95% 94%  94%  Weight:  74.3 kg      Intake/Output Summary (Last 24 hours) at 06/09/2023 0755 Last data filed at 06/09/2023 0500 Gross per 24 hour  Intake 100 ml  Output 1450 ml  Net -1350 ml      06/09/2023    4:09 AM 06/08/2023    6:32 AM 06/07/2023    7:14 AM  Last 3 Weights  Weight (lbs) 163 lb 12.8 oz 166 lb 14.2 oz 167 lb 5.3 oz  Weight (kg) 74.3 kg 75.7 kg 75.9 kg      Telemetry/ECG    Heart rates look more improved today not as tachycardic.  Appears he might be in WAP versus sinus arrhythmia at times.  Heart rates 80s to 100 generally.- Personally Reviewed  CV Studies    Echocardiogram 06/03/2023 1. Left ventricular ejection fraction, by estimation, is 55 to 60%. The  left ventricle has normal function. The left ventricle has no regional  wall motion abnormalities. Left ventricular diastolic parameters were  normal.   2. Right ventricular systolic function is normal. The right ventricular  size is normal. There is mildly elevated pulmonary artery systolic  pressure. The estimated right ventricular systolic pressure is 42.5 mmHg.   3. Large pleural effusion in the left lateral region.   4. The mitral valve is normal in structure. Trivial mitral valve  regurgitation. No  evidence of mitral stenosis.   5. The aortic valve has an indeterminant number of cusps. Aortic valve  regurgitation is not visualized. No aortic stenosis is present.   6. The inferior vena cava is dilated in size with <50% respiratory  variability, suggesting right atrial pressure of 15 mmHg.   Comparison(s): Changes from prior study are noted. LVEF improved from 25%  to normal now.   Limited echocardiogram 05/28/2023 1. Limited study.   2. Left ventricular ejection fraction, by estimation, is 20 to 25%. The  left ventricle has severely decreased function. The left ventricle  demonstrates global hypokinesis.   3. RV-RA gradient 21 mmHg suggesting normal to mildly increased estimated  RVSP depending on CVP. Right ventricular systolic function is severely  reduced. The right ventricular size is moderately enlarged.   4. The mitral valve is grossly normal. Mild mitral valve regurgitation.   5. The tricuspid valve is abnormal. Tricuspid valve regurgitation is  moderate to severe.   6. The aortic valve is tricuspid.   7. Unable to estimate CVP.   Comparison(s): Prior images reviewed side by side. LVEF 20-25% with global  hypokinesis and decrease compared with prior study.   Echocardiogram 05/26/2023 1. Left ventricular ejection fraction, by estimation, is  60 to 65%. The  left ventricle has normal function. The left ventricle has no regional  wall motion abnormalities. Left ventricular diastolic parameters were  normal.   2. Right ventricular systolic function is normal. The right ventricular  size is normal.   3. The mitral valve is normal in structure. Trivial mitral valve  regurgitation. No evidence of mitral stenosis.   4. The aortic valve is tricuspid. Aortic valve regurgitation is not  visualized. No aortic stenosis is present.   5. The inferior vena cava is normal in size with greater than 50%  respiratory variability, suggesting right atrial pressure of 3 mmHg.   Physical  Exam .   GEN: No acute distress.   Neck: no JVD Cardiac: RRR, no murmurs, rubs, or gallops.  Respiratory: CTA bilaterallt GI: Soft, nontender, non-distended  MS: no thigh edema  Patient Profile    Timothy Hubbard is a 32 y.o. male has hx of seizures, hypertension, asthma and admitted on 05/25/2023.  Patient here with PEA arrest x 2 status post CPR with ROSC admitted to the ICU intubated.  Postextubation had another PEA arrest 05/27/2023 with 2 rounds of CPR.  Echocardiogram showed reduced EF 20 to 25%.  Had normalization of EF 06/03/2023.  Course has been complicated by shock liver, DIC AKI, seizure.  Now has transitioned to general cardiology services.   Assessment & Plan .     Acute HFrEF Stress cardiomyopathy PEA arrest in the setting of seizures Echocardiogram 05/26/2023 normal EF 60 to 65% after first PEA arrest.  Second PEA arrest occurred on 05/26/2023 with EF 25%, severely reduced RV function.  Repeat echo on 05/26/2023 shows recovered EF 55 to 60%.  Appears to be progressing appropriately with improvement in renal and liver function.   Maintaining euvolemia off lasix, lungs sounds better.  Has significant muscle atrophy. No new labs today, spoke with nurse.  Does not appear to have had any more seizures yesterday.  BP is reasonable. May be able to start adding on BB if felt to be stable enough.  Continue hydralazine 50mg  every 8, Imdur 30 mg.  Titrate GDMT once renal function stabilizes more.  Consider initiating BB after diureses and once more stable.  Still may consider spiro if we continue to have issues with hyperaldosteronism.  Pleural effusions 06/05/2023 noted to be moderate.  Repeat CXR shows no changes, stable for now.   AKI Likely ATN related to postarrest.  Previously normal renal function PTA.  Creatinine as high as 4.6.  Now downtrending appropriately.  Today 2.69>2.23>1.8>pending labs.  Nephrology following.  Hypokalemia Has now normalized with aggressive supplementation  and mag.   Shock liver Related to postarrest.  LFTs continue to improve.  RLE DVT On lovonox now with seizures.   Abuse/alcohol abuse Agreeable to cessation.  DIC Required MTP.  Suspected secondary to shock liver.  Hematology following.  Seems stable now with no further bleeding.  For questions or updates, please contact [facility] Please consult [email or web address] for contact info under        Signed, Abagail Kitchens, PA-C

## 2023-06-09 NOTE — Assessment & Plan Note (Addendum)
S/p 4 units PRBC, 4 units FFP, and 2 units platelets and 1 unit of cryo. Hgb 12.4, plt 248. No evidence of active bleed.  - Continue Eliquis for 6 months per hematology.

## 2023-06-09 NOTE — Assessment & Plan Note (Signed)
Per Heme/Onc, will need anticoag with Eliquis for 6 months.  -Was transitioned to Lovenox due to swallowing concerns; will restart Eliquis today. -  Eliquis 5 mg BID dosing - AM CBC

## 2023-06-09 NOTE — Progress Notes (Signed)
Physical Therapy Treatment Patient Details Name: Timothy Hubbard MRN: [id number] DOB: [birth year] Today's Date: 06/09/2023   History of Present Illness 32 y.o. male admitted 9/19 for seizures complicated by PEA cardiac arrest x 2, approximately 3 minutes total downtime. Pt with shock of mixed etiology, cardiogenic and septic due to aspiration PNA. Intubation 9/19-9/20, 9/21 3rd brief PEA arrest and re intubated 9/21-9/26. Course complicated by oliguric AKI, hemoperitoneum and acute anemia in setting of DIC, DVT in RLE. Seizure 10/02 now on continuous EEG. PMhx: seizures, HTN, asthma, mood disorder, substance abuse    PT Comments  Pt now s/p seizure on 10/2  with residual weakness of LUE. Pt on continuous EEG so mobility limited. Able to amb short distance in room with min assist. Due to LUE weakness use of walker was not effective. Patient may benefit from intensive inpatient follow up therapy, >3 hours/day unless he progresses rapidly once off of EEG and able to mobilize more freely. Will continue to see and update dc recommendations as needed.      If plan is discharge home, recommend the following: A little help with walking and/or transfers;A little help with bathing/dressing/bathroom;Assistance with cooking/housework;Assist for transportation;Direct supervision/assist for medications management;Direct supervision/assist for financial management   Can travel by private vehicle        Equipment Recommendations  None recommended by PT    Recommendations for Other Services       Precautions / Restrictions Precautions Precautions: Other (comment) Precaution Comments: impaired cognition Restrictions Weight Bearing Restrictions: No     Mobility  Bed Mobility Overal bed mobility: Needs Assistance Bed Mobility: Supine to Sit, Sit to Supine     Supine to sit: HOB elevated, Used rails, Contact guard Sit to supine: Contact guard assist   General bed mobility comments: Assist for safety  and to manage lines    Transfers Overall transfer level: Needs assistance Equipment used: Rolling walker (2 wheels), None Transfers: Sit to/from Stand Sit to Stand: Min assist           General transfer comment: Assist for stability and safety    Ambulation/Gait Ambulation/Gait assistance: Min assist Gait Distance (Feet): 10 Feet (10' x 1, 6' x 1) Assistive device: None, Rolling walker (2 wheels) Gait Pattern/deviations: Step-through pattern, Decreased stride length, Narrow base of support Gait velocity: decr Gait velocity interpretation: 1.31 - 2.62 ft/sec, indicative of limited community ambulator   General Gait Details: Assist for stability and safety. Unable to use walker effectively due to lt hand weakness. Did better without assistive device. Distance limited by continuous EEG monitoring   Stairs             Wheelchair Mobility     Tilt Bed    Modified Rankin (Stroke Patients Only)       Balance Overall balance assessment: Needs assistance Sitting-balance support: No upper extremity supported, Feet supported Sitting balance-Leahy Scale: Good Sitting balance - Comments: EOB with supervision   Standing balance support: No upper extremity supported Standing balance-Leahy Scale: Fair                              Cognition Arousal: Alert Behavior During Therapy: Flat affect Overall Cognitive Status: Impaired/Different from baseline Area of Impairment: Attention, Memory, Following commands, Safety/judgement, Awareness, Problem solving                   Current Attention Level: Sustained Memory: Decreased short-term memory Following Commands: Follows one  step commands consistently Safety/Judgement: Decreased awareness of deficits Awareness: Emergent Problem Solving: Slow processing, Requires verbal cues General Comments: At times irrelevant verbalizations        Exercises      General Comments General comments (skin  integrity, edema, etc.): VSS on RA      Pertinent Vitals/Pain Pain Assessment Pain Assessment: No/denies pain    Home Living                          Prior Function            PT Goals (current goals can now be found in the care plan section) Progress towards PT goals: Progressing toward goals    Frequency    Min 1X/week      PT Plan      Co-evaluation              AM-PAC PT "6 Clicks" Mobility   Outcome Measure  Help needed turning from your back to your side while in a flat bed without using bedrails?: None Help needed moving from lying on your back to sitting on the side of a flat bed without using bedrails?: A Little Help needed moving to and from a bed to a chair (including a wheelchair)?: A Little Help needed standing up from a chair using your arms (e.g., wheelchair or bedside chair)?: A Little Help needed to walk in hospital room?: A Little Help needed climbing 3-5 steps with a railing? : A Little 6 Click Score: 19    End of Session Equipment Utilized During Treatment: Gait belt Activity Tolerance: Patient tolerated treatment well Patient left: in bed;with call bell/phone within reach;with bed alarm set   PT Visit Diagnosis: Other abnormalities of gait and mobility (R26.89);Muscle weakness (generalized) (M62.81)     Time: 4098-1191 PT Time Calculation (min) (ACUTE ONLY): 15 min  Charges:    $Gait Training: 8-22 mins PT General Charges $$ ACUTE PT VISIT: 1 Visit                     [facility] PT Acute Rehabilitation Services Office [phone]    Angelina Ok [facility] 06/09/2023, 3:58 PM

## 2023-06-09 NOTE — Plan of Care (Signed)
  Problem: Education: Goal: Ability to manage disease process will improve Outcome: Progressing   Problem: Cardiac: Goal: Ability to achieve and maintain adequate cardiopulmonary perfusion will improve Outcome: Progressing   Problem: Neurologic: Goal: Promote progressive neurologic recovery Outcome: Progressing   Problem: Skin Integrity: Goal: Risk for impaired skin integrity will be minimized. Outcome: Progressing   Problem: Education: Goal: Knowledge of General Education information will improve Description: Including pain rating scale, medication(s)/side effects and non-pharmacologic comfort measures Outcome: Progressing   Problem: Health Behavior/Discharge Planning: Goal: Ability to manage health-related needs will improve Outcome: Progressing   Problem: Activity: Goal: Risk for activity intolerance will decrease Outcome: Progressing   Problem: Safety: Goal: Ability to remain free from injury will improve Outcome: Progressing

## 2023-06-09 NOTE — Assessment & Plan Note (Signed)
S/p PEA arrest 9/19 and 9/21, now extubated, off pressors and transitioned out of ICU.  - Cardiology consulted, appreciate recs

## 2023-06-10 DIAGNOSIS — R569 Unspecified convulsions: Secondary | ICD-10-CM | POA: Diagnosis not present

## 2023-06-10 DIAGNOSIS — I469 Cardiac arrest, cause unspecified: Secondary | ICD-10-CM | POA: Diagnosis not present

## 2023-06-10 LAB — BASIC METABOLIC PANEL
Anion gap: 10 (ref 5–15)
BUN: 10 mg/dL (ref 6–20)
CO2: 18 mmol/L — ABNORMAL LOW (ref 22–32)
Calcium: 7.9 mg/dL — ABNORMAL LOW (ref 8.9–10.3)
Chloride: 111 mmol/L (ref 98–111)
Creatinine, Ser: 1.43 mg/dL — ABNORMAL HIGH (ref 0.61–1.24)
GFR, Estimated: >60 mL/min (ref >60)
Glucose, Bld: 76 mg/dL (ref 70–99)
Potassium: 3.7 mmol/L (ref 3.5–5.1)
Sodium: 139 mmol/L (ref 135–145)

## 2023-06-10 LAB — CBC
HCT: 37.8 % — ABNORMAL LOW (ref 39.0–52.0)
Hemoglobin: 12.4 g/dL — ABNORMAL LOW (ref 13.0–17.0)
MCH: 29.5 pg (ref 26.0–34.0)
MCHC: 32.8 g/dL (ref 30.0–36.0)
MCV: 89.8 fL (ref 80.0–100.0)
Platelets: 271 10*3/uL (ref 150–400)
RBC: 4.21 MIL/uL — ABNORMAL LOW (ref 4.22–5.81)
RDW: 20.2 % — ABNORMAL HIGH (ref 11.5–15.5)
WBC: 8.8 10*3/uL (ref 4.0–10.5)
nRBC: 0 % (ref 0.0–0.2)

## 2023-06-10 LAB — MAGNESIUM: Magnesium: 1.9 mg/dL (ref 1.7–2.4)

## 2023-06-10 NOTE — Assessment & Plan Note (Signed)
-   Cont IV Hydralazine 50 mg q8, per Cards - Holding home amlodipine, lisinopril

## 2023-06-10 NOTE — Progress Notes (Addendum)
Inpatient Rehab Admissions:  Inpatient Rehab Consult received.  I met with patient at the bedside for rehabilitation assessment and to discuss goals and expectations of an inpatient rehab admission.  Dicussed average length of stay and discharge home after completion of CIR. Pt asked AC to talk to his mother about CIR. Called pt's mother Adela Lank. No one answered. Left a message; awaiting return call.  1158: Spoke with pt's mother. Discussed average length of stay and discharge home after completion of CIR. She acknowledged  understanding. She is interested in pt pursuing CIR. She confirmed that she along with family and a healthcare aide will be able to provide pt 24/7 support after discharge.   Signed: Wolfgang Phoenix, MS, CCC-SLP Admissions Coordinator [phone]

## 2023-06-10 NOTE — Assessment & Plan Note (Signed)
2/2 cardiac arrest. Transaminases have been steadily downtrending. Abdominal exam continues to be benign.

## 2023-06-10 NOTE — Progress Notes (Signed)
Occupational Therapy Treatment Patient Details Name: Timothy Hubbard MRN: [id number] DOB: 02/01/1991 Today's Date: 06/10/2023   History of present illness 32 y.o. male admitted 9/19 for seizures complicated by PEA cardiac arrest x 2, approximately 3 minutes total downtime. Pt with shock of mixed etiology, cardiogenic and septic due to aspiration PNA. Intubation 9/19-9/20, 9/21 3rd brief PEA arrest and re intubated 9/21-9/26. Course complicated by oliguric AKI, hemoperitoneum and acute anemia in setting of DIC, DVT in RLE. Seizure 10/02 now on continuous EEG. PMhx: seizures, HTN, asthma, mood disorder, substance abuse   OT comments  Pt was re evaluated due to reports of changes in LUE. Pt noted to have WNL at shoulder and elbow but presents with significant L wrist drop. Pt has trace movements in L hand and is able to make a very gross grasp. Pt at this time was educated about positioning and use of L hand (non dominate hand). Splints have been ordered to further assess functional use of LUE and decrease any further risk of injury. Acute Occupational Therapy will continue to follow.       If plan is discharge home, recommend the following:  A little help with walking and/or transfers;A little help with bathing/dressing/bathroom;Assistance with cooking/housework;Help with stairs or ramp for entrance;Assist for transportation;Direct supervision/assist for medications management;Direct supervision/assist for financial management;Supervision due to cognitive status   Equipment Recommendations  None recommended by OT    Recommendations for Other Services      Precautions / Restrictions Precautions Precautions: Other (comment) Precaution Comments: limited L use of UE Restrictions Weight Bearing Restrictions: No       Mobility Bed Mobility Overal bed mobility: Needs Assistance Bed Mobility: Supine to Sit, Sit to Supine     Supine to sit: Min assist Sit to supine: Min assist         Transfers Overall transfer level: Needs assistance Equipment used: Rolling walker (2 wheels) Transfers: Sit to/from Stand Sit to Stand: Contact guard assist                 Balance Overall balance assessment: Needs assistance Sitting-balance support: Feet supported Sitting balance-Leahy Scale: Good     Standing balance support: No upper extremity supported Standing balance-Leahy Scale: Fair Standing balance comment: Pt was able to retrieve items from the floor but noted had LOB with 360 turns                           ADL either performed or assessed with clinical judgement   ADL Overall ADL's : Needs assistance/impaired Eating/Feeding: Set up;Sitting   Grooming: Set up;Bed level   Upper Body Bathing: Minimal assistance;Moderate assistance;Sitting   Lower Body Bathing: Moderate assistance;Sit to/from stand   Upper Body Dressing : Minimal assistance;Sitting   Lower Body Dressing: Moderate assistance;Sit to/from stand   Toilet Transfer: Minimal assistance;Contact guard assist   Toileting- Clothing Manipulation and Hygiene: Moderate assistance              Extremity/Trunk Assessment Upper Extremity Assessment Upper Extremity Assessment: Right hand dominant;LUE deficits/detail LUE Deficits / Details: LUE with new weakness after seizure yesterday (10/2) with no active wrist flexion/extension, radial/ulnar deviation, and trace activation of digit flexion. Poor GM coordination but noted trace movements LUE Sensation:  (decrease in sensing tempature) LUE Coordination: decreased fine motor   Lower Extremity Assessment Lower Extremity Assessment: Defer to PT evaluation        Vision       Perception  Praxis      Cognition Arousal: Alert Behavior During Therapy: Impulsive Overall Cognitive Status: Impaired/Different from baseline Area of Impairment: Safety/judgement                 Orientation Level: Disoriented to, Time Current  Attention Level: Sustained Memory: Decreased short-term memory Following Commands: Follows one step commands consistently Safety/Judgement: Decreased awareness of deficits Awareness: Emergent            Exercises      Shoulder Instructions       General Comments      Pertinent Vitals/ Pain       Pain Assessment Pain Assessment: No/denies pain  Home Living   Living Arrangements: Parent;Other relatives (sister) Available Help at Discharge: Family;Personal care attendant;Available 24 hours/day Type of Home: House Home Access: Stairs to enter [employer] of Steps: 4 Entrance Stairs-Rails: Right;Left;Can reach both Home Layout: One level     Bathroom Shower/Tub: Chief Strategy Officer: Standard Bathroom Accessibility: Yes How Accessible: Accessible via walker        Lives With: Family    Prior Functioning/Environment              Frequency  Min 1X/week        Progress Toward Goals  OT Goals(current goals can now be found in the care plan section)  Progress towards OT goals: Progressing toward goals  Acute Rehab OT Goals Patient Stated Goal: to do something for my hand OT Goal Formulation: With patient Time For Goal Achievement: 06/17/23 Potential to Achieve Goals: Good ADL Goals Pt Will Perform Lower Body Bathing: with min assist;sitting/lateral leans;sit to/from stand Pt Will Perform Upper Body Dressing: with set-up;sitting Pt Will Perform Lower Body Dressing: with min assist;sitting/lateral leans;sit to/from stand Pt Will Transfer to Toilet: with contact guard assist;ambulating;regular height toilet;grab bars Pt Will Perform Toileting - Clothing Manipulation and hygiene: sitting/lateral leans;sit to/from stand;with min assist Pt/caregiver will Perform Home Exercise Program: Increased strength;Both right and left upper extremity;With theraband;With theraputty;With Supervision;With written HEP provided  Plan       Co-evaluation                 AM-PAC OT "6 Clicks" Daily Activity     Outcome Measure   Help from another person eating meals?: None Help from another person taking care of personal grooming?: A Little Help from another person toileting, which includes using toliet, bedpan, or urinal?: A Lot Help from another person bathing (including washing, rinsing, drying)?: A Lot Help from another person to put on and taking off regular upper body clothing?: A Little Help from another person to put on and taking off regular lower body clothing?: A Lot 6 Click Score: 16    End of Session Equipment Utilized During Treatment: Gait belt  OT Visit Diagnosis: Unsteadiness on feet (R26.81);Muscle weakness (generalized) (M62.81);Other (comment);Other symptoms and signs involving cognitive function   Activity Tolerance Patient tolerated treatment well   Patient Left in bed;with call bell/phone within reach;with bed alarm set   Nurse Communication Mobility status        Time: 1151-1230 OT Time Calculation (min): 39 min  Charges: OT General Charges $OT Visit: 1 Visit OT Evaluation $OT Re-eval: 1 Re-eval OT Treatments $Self Care/Home Management : 23-37 mins  Presley Raddle OTR/L  Acute Rehab Services  [phone] office number   Alphia Moh 06/10/2023, 1:34 PM

## 2023-06-10 NOTE — Assessment & Plan Note (Signed)
Repeat echo on 05/26/2023 shows recovered EF 55 to 60%. - Cont imdur 30mg  daily - Cardiology consulted, appreciate recs

## 2023-06-10 NOTE — Assessment & Plan Note (Addendum)
S/p 4 units PRBC, 4 units FFP, and 2 units platelets and 1 unit of cryo.  Globin and platelets have been stable. - Continue Eliquis for 6 months per hematology.

## 2023-06-10 NOTE — Assessment & Plan Note (Signed)
Likely 2/2 fluid overload.

## 2023-06-10 NOTE — Assessment & Plan Note (Signed)
Initially noted 10/3. - Wound care consulted, appreciate recs - wrapped this AM; RN to continue care per [facility] instructions

## 2023-06-10 NOTE — Progress Notes (Signed)
Daily Progress Note Intern Pager: [phone]  Patient name: Timothy Hubbard Medical record number: [id number] Date of birth: Nov 01, 1990 Age: 32 y.o. Gender: male  Primary Care Provider: Vonna Drafts, MD Consultants: Cardiology/Heart Failure, Nephrology (signed off 10/3), Oncology (signed off 10/1) Code Status: FULL   Pt Overview and Major Events to Date:  9/19 2 PEA arrests ROSC achieved, admit to ICU; seizure and cardiac arrest 9/20 extubated 05/27/2023: PEA arrest and after woke up and was confused.  Total of 2 rounds of CPR with epi and bicarb x 1.  But became bradycardic > intubated.  Had A-line placed.  Also central line ascites tap did show blood.  Was given 4 units PRBC, 4 units FFP and 2 units platelets and 1 unit of cryo major transfusion protocol Cc consult for hemoperitoneum.  DIC panel positive.  He had some oozing from mouth and IV sites EEG showed symmetric low voltage no/seizure activity 06/01/2023: Extubated 06/03/2023: Transferred to FMTS 06/05/2023: Transitioning to peripheral IV, switching heparin to oral anticoagulant (eliquis)  06/07/2023: Apparent tonic-clonic seizure 06/08/2023: Long-term EEG; passed SLP swallow eval and return to regular diet   Assessment and Plan: Timothy Hubbard is a 32 year old male with PMH of seizures, hypertension and asthma who presented after witnessed seizure with PEA arrest x 2 who was intubated and transferred to ICU 9/19 and out to FMTS on 9/28.   Patient is continuing to refuse potassium supplementation, but has been getting scheduled labs.  He is off of Lasix now and his potassium is stable at 3.7.  May not need any more supplementation.  Will continue to monitor and encourage cooperation with medical care.  Patient and family remain hopeful for CIR. Assessment & Plan Seizure disorder (HCC) Tonic-clonic seizure 10/2. LTM EEG with evidence of epileptogenicity arising from frontocentral region. - Neurology consulted, appreciate recs.   - Long-term EEG in progress; if no further seizure activity, neurology may discontinue EEG today. - Increase Keppra to 1000mg  BID per neurology - Seizure precautions - AM BMP, magnesium Hypokalemia K 3.7 this AM. -Check in AM and replete as needed - Cardiology questions possible hyperaldosteronism with persistently low K and elevated BPs. Will consider adding spironolactone once renal fxn improves. AKI (acute kidney injury) (HCC) Thought to be ATN 2/2 cardiac arrest. Cr as high as 4.6; slight bump from yesterday, today 1.43. ANCA workup negative. Lasix d/c'd by cardiology. - Nephrology consulted, appreciate recs - Strict I/O's - Daily weights - Avoid nephrotoxic drugs - Preferred narcotics for pain control will be hydromorphone, fentanyl, and methadone  - AM BMP - will monitor Cr for titration of Keppra per neurology Acute deep vein thrombosis (DVT) of right peroneal vein (HCC) Per Heme/Onc, will need anticoag with Eliquis for 6 months.  -Was transitioned to Lovenox due to swallowing concerns; will restart Eliquis today. -  Eliquis 5 mg BID dosing - AM CBC  Coagulopathy (HCC) S/p 4 units PRBC, 4 units FFP, and 2 units platelets and 1 unit of cryo.  Globin and platelets have been stable. - Continue Eliquis for 6 months per hematology. Hypertension - Cont IV Hydralazine 50 mg q8, per Cards - Holding home amlodipine, lisinopril  Pressure ulcer of left heel Initially noted 10/3. - Wound care consulted, appreciate recs - wrapped this AM; RN to continue care per [facility] instructions Acute HFrEF (heart failure with reduced ejection fraction) (HCC) Repeat echo on 05/26/2023 shows recovered EF 55 to 60%. - Cont imdur 30mg  daily - Cardiology consulted, appreciate recs  Acute respiratory failure with hypercapnia (HCC) - Currently on room air  - Diurese discussed below - Tylenol prn for rib pain.  -Consider further pain management as needed to prevent atelectasis.  - Tx asthma as below -  Incentive spirometry + pulm toilet as indicated - PT/OT for deconditioning Cardiac arrest (HCC) S/p PEA arrest 9/19 and 9/21, now extubated, off pressors and transitioned out of ICU. - Cardiology consulted, appreciate recs Asthma - Cont pulmicort and brovana - Duonebs q4h prn Pleural effusion, right Likely 2/2 fluid overload. Shock liver 2/2 cardiac arrest. Transaminases have been steadily downtrending. Abdominal exam continues to be benign.   Chronic and Stable Problems:  Mood disorder: olanzapine 5 mg nightly   FEN/GI: Regular diet, Boost 3 times daily PPx: Eliquis Dispo: Hopeful for CIR; pending clinical improvement/stability and signoff of specialists.     Subjective:  Seen this morning resting in bed.  Denies pain.  No concerns other than getting the EEG leads off of his status soon as possible.  Heel wound remains wrapped and he denies any discomfort related to this.  He is pleasant and cooperative until I attempted to discuss his refusal of potassium supplementation at which time he puts the blanket back over his head and says "goodbye".  Objective: Temp:  [97.9 F (36.6 C)-98.9 F (37.2 C)] 98 F (36.7 C) (10/05 0704) Pulse Rate:  [45-96] 45 (10/05 0704) Resp:  [18] 18 (10/05 0704) BP: (137-163)/(80-108) 163/108 (10/05 0704) SpO2:  [90 %-96 %] 96 % (10/05 0704) Weight:  [74.8 kg] 74.8 kg (10/05 0418) Physical Exam: General: Lying in bed with the lights off resting, no acute distress Cardiovascular: RRR Respiratory: CTA bilaterally Abdomen: Soft, nontender, nondistended.  No masses. Extremities: Left heel wrapped, clean and dry. Neuro: Unable to complete neuro exam as patient will not cooperate; was able to roll over in bed and pull sheet over head with right arm.  Laboratory: Most recent CBC Lab Results  Component Value Date   WBC 8.8 06/10/2023   HGB 12.4 (L) 06/10/2023   HCT 37.8 (L) 06/10/2023   MCV 89.8 06/10/2023   PLT 271 06/10/2023   Most recent  BMP    Latest Ref Rng & Units 06/10/2023    6:59 AM  BMP  Glucose 70 - 99 mg/dL 76   BUN 6 - 20 mg/dL 10   Creatinine 0.98 - 1.24 mg/dL 1.19   Sodium 147 - 829 mmol/L 139   Potassium 3.5 - 5.1 mmol/L 3.7   Chloride 98 - 111 mmol/L 111   CO2 22 - 32 mmol/L 18   Calcium 8.9 - 10.3 mg/dL 7.9     [name], DO 06/10/2023, 10:21 AM  PGY-1, [facility] FPTS Intern pager: [phone], text pages welcome Secure chat group [facility] Teaching Service

## 2023-06-10 NOTE — Procedures (Addendum)
Patient Name: Timothy Hubbard  MRN: [id number]  Epilepsy Attending: Charlsie Quest  Referring Physician/Provider: Caryl Pina, MD  Duration: 06/09/2023 1645 to 06/10/2023 1133   Patient history: 32 year old male with history of seizures, hypertension and asthma who presented after had witnessed seizure. EEG to evaluate for seizure   Level of alertness: Awake, asleep   AEDs during EEG study: LEV   Technical aspects: This EEG study was done with scalp electrodes positioned according to the 10-20 International system of electrode placement. Electrical activity was reviewed with band pass filter of 1-70Hz , sensitivity of 7 uV/mm, display speed of 67mm/sec with a 60Hz  notched filter applied as appropriate. EEG data were recorded continuously and digitally stored.  Video monitoring was available and reviewed as appropriate.   Description: The posterior dominant rhythm consists of 8-9 Hz activity of moderate voltage (25-35 uV) seen predominantly in posterior head regions, symmetric and reactive to eye opening and eye closing.  Sleep was characterized by vertex waves, sleep spindles (12 to 14 Hz), maximal frontocentral region.  Sharp waves were noted in right frontocentral region.    ABNORMALITY - Sharp waves, right frontocentral region   IMPRESSION: This study showed evidence of epileptogenicity arising from frontocentral region. No seizures were noted.   Shrihan Putt [name]

## 2023-06-10 NOTE — Assessment & Plan Note (Signed)
Per Heme/Onc, will need anticoag with Eliquis for 6 months.  -Was transitioned to Lovenox due to swallowing concerns; will restart Eliquis today. -  Eliquis 5 mg BID dosing - AM CBC

## 2023-06-10 NOTE — Discharge Instructions (Signed)
nephrology follow-up appointment is at [facility] 07/18/23 at 8:20 am with Dr. Valentino Nose

## 2023-06-10 NOTE — Assessment & Plan Note (Signed)
-   Currently on room air  - Diurese discussed below - Tylenol prn for rib pain.  -Consider further pain management as needed to prevent atelectasis.  - Tx asthma as below - Incentive spirometry + pulm toilet as indicated - PT OT for deconditioning

## 2023-06-10 NOTE — Progress Notes (Signed)
LTM EEG disconnected - no skin breakdown at unhook.  

## 2023-06-10 NOTE — Progress Notes (Signed)
Patient Name: Timothy Hubbard Date of Encounter: 06/10/2023 [facility] Cardiologist: None   Interval Summary  .    Does not appear that he has had any seizures yesterday.  He has motor deficits in his left upper extremity that he feels related to IV placement rather than his seizures.  Tried to discuss with patient likely due to his seizures however is very adamant that it was due to wrong IV administration.  He is without any other complaints.  Vital Signs .    Vitals:   06/09/23 1924 06/09/23 2213 06/10/23 0418 06/10/23 0704  BP: (!) 141/92 138/88 (!) 153/92 (!) 163/108  Pulse: 92 84 92 (!) 45  Resp: 18 18 18 18   Temp: 97.9 F (36.6 C) 98.9 F (37.2 C) 98 F (36.7 C) 98 F (36.7 C)  TempSrc: Oral Oral Oral Oral  SpO2: 94% 94% 94% 96%  Weight:   74.8 kg     Intake/Output Summary (Last 24 hours) at 06/10/2023 0949 Last data filed at 06/10/2023 0300 Gross per 24 hour  Intake 566.36 ml  Output 200 ml  Net 366.36 ml      06/10/2023    4:18 AM 06/09/2023    4:09 AM 06/08/2023    6:32 AM  Last 3 Weights  Weight (lbs) 164 lb 14.5 oz 163 lb 12.8 oz 166 lb 14.2 oz  Weight (kg) 74.8 kg 74.3 kg 75.7 kg      Telemetry/ECG    Heart rates look more improved today not as tachycardic.  Appears he might be in WAP versus sinus arrhythmia at times.  Heart rates 80s to 100 generally.- Personally Reviewed  CV Studies    Echocardiogram 06/03/2023 1. Left ventricular ejection fraction, by estimation, is 55 to 60%. The  left ventricle has normal function. The left ventricle has no regional  wall motion abnormalities. Left ventricular diastolic parameters were  normal.   2. Right ventricular systolic function is normal. The right ventricular  size is normal. There is mildly elevated pulmonary artery systolic  pressure. The estimated right ventricular systolic pressure is 42.5 mmHg.   3. Large pleural effusion in the left lateral region.   4. The mitral valve is normal in  structure. Trivial mitral valve  regurgitation. No evidence of mitral stenosis.   5. The aortic valve has an indeterminant number of cusps. Aortic valve  regurgitation is not visualized. No aortic stenosis is present.   6. The inferior vena cava is dilated in size with <50% respiratory  variability, suggesting right atrial pressure of 15 mmHg.   Comparison(s): Changes from prior study are noted. LVEF improved from 25%  to normal now.   Limited echocardiogram 05/28/2023 1. Limited study.   2. Left ventricular ejection fraction, by estimation, is 20 to 25%. The  left ventricle has severely decreased function. The left ventricle  demonstrates global hypokinesis.   3. RV-RA gradient 21 mmHg suggesting normal to mildly increased estimated  RVSP depending on CVP. Right ventricular systolic function is severely  reduced. The right ventricular size is moderately enlarged.   4. The mitral valve is grossly normal. Mild mitral valve regurgitation.   5. The tricuspid valve is abnormal. Tricuspid valve regurgitation is  moderate to severe.   6. The aortic valve is tricuspid.   7. Unable to estimate CVP.   Comparison(s): Prior images reviewed side by side. LVEF 20-25% with global  hypokinesis and decrease compared with prior study.   Echocardiogram 05/26/2023 1. Left  ventricular ejection fraction, by estimation, is 60 to 65%. The  left ventricle has normal function. The left ventricle has no regional  wall motion abnormalities. Left ventricular diastolic parameters were  normal.   2. Right ventricular systolic function is normal. The right ventricular  size is normal.   3. The mitral valve is normal in structure. Trivial mitral valve  regurgitation. No evidence of mitral stenosis.   4. The aortic valve is tricuspid. Aortic valve regurgitation is not  visualized. No aortic stenosis is present.   5. The inferior vena cava is normal in size with greater than 50%  respiratory variability,  suggesting right atrial pressure of 3 mmHg.   Physical Exam .   GEN: No acute distress.   Neck: no JVD Cardiac: RRR, no murmurs, rubs, or gallops.  Respiratory: CTA bilaterallt GI: Soft, nontender, non-distended  MS: no thigh edema  Patient Profile    Timothy Hubbard is a 32 y.o. male has hx of seizures, hypertension, asthma and admitted on 05/25/2023.  Patient here with PEA arrest x 2 status post CPR with ROSC admitted to the ICU intubated.  Postextubation had another PEA arrest 05/27/2023 with 2 rounds of CPR.  Echocardiogram showed reduced EF 20 to 25%.  Had normalization of EF 06/03/2023.  Course has been complicated by shock liver, DIC AKI, seizure.    Assessment & Plan .     Acute HFrEF Stress cardiomyopathy PEA arrest in the setting of seizures Echocardiogram 05/26/2023 normal EF 60 to 65% after first PEA arrest.  Second PEA arrest occurred on 05/26/2023 with EF 25%, severely reduced RV function.  Repeat echo on 05/26/2023 shows recovered EF 55 to 60%.  Appears to be progressing appropriately with improvement in renal and liver function.   Continue hydralazine 50mg  every 8, Imdur 30 mg.  Titrate GDMT once renal function stabilizes more.  Consider initiating BB after diureses and once more stable.  Still may consider spiro if we continue to have issues with hyperaldosteronism.  Pleural effusions 06/05/2023 noted to be moderate.  Repeat CXR shows no changes, stable for now.   AKI Likely ATN related to postarrest.  Previously normal renal function PTA.  Creatinine as high as 4.6.  Now 1.43  Hypokalemia Has now normalized with aggressive supplementation and mag.   Shock liver Related to postarrest.  ASTnow normal ALT near normal   RLE DVT On eliquis now   Abuse/alcohol abuse Agreeable to cessation.  DIC Required MTP.  Suspected secondary to shock liver.  Hematology following.  Seems stable now with no further bleeding.PLT count 271  For questions or updates, please contact [facility] Please consult [email or web address] for contact info under        Signed, Charlton Haws, MD

## 2023-06-10 NOTE — Assessment & Plan Note (Addendum)
Tonic-clonic seizure 10/2. LTM EEG with evidence of epileptogenicity arising from frontocentral region. - Neurology consulted, appreciate recs.  - Long-term EEG in progress; if no further seizure activity, neurology may discontinue EEG today. - Increase Keppra to 1000mg  BID per neurology - Seizure precautions - AM BMP, magnesium

## 2023-06-10 NOTE — Progress Notes (Signed)
Neurology Progress Note   S:// Sleeping on initial arrival to room. Participates in a limited exam, continues to try to sleep while participating No seizures overnight, will d/c EEG today.   AEDS:// Previous AEDS: Depakote 500mg  BID (03/2022-05/2023) - stopped due to shock liver one day after admission for cardiac arrest Current AEDS: Keppra 1000mg  BID    O:// Current vital signs: BP (!) 163/108 (BP Location: Right Arm)   Pulse 92   Temp 98 F (36.7 C) (Oral)   Resp 18   Wt 74.8 kg   SpO2 96%   BMI 26.62 kg/m  Vital signs in last 24 hours: Temp:  [97.7 F (36.5 C)-98.9 F (37.2 C)] 98 F (36.7 C) (10/05 0704) Pulse Rate:  [84-96] 92 (10/05 0418) Resp:  [18] 18 (10/05 0704) BP: (137-174)/(80-118) 163/108 (10/05 0704) SpO2:  [90 %-97 %] 96 % (10/05 0704) Weight:  [74.8 kg] 74.8 kg (10/05 0418)  GENERAL: Awake, alert in NAD HEENT: - Normocephalic and atraumatic by casual observation    NEURO:  Mental Status: Sleeping initially, he awakens quickly to voice, but wants to continue to sleep. Speech comprehension and fluency are normal  Cranial Nerves: II: Visual Fields are full. Pupils are equal, round, and reactive to light.   III,IV, VI: EOMI without ptosis or diploplia. VII: Facial movement is symmetric resting and talking VIII: Hearing is intact to voice X: Phonation normal XI: Shoulder shrug is symmetric. XII: Tongue protrudes midline without atrophy or fasciculations.  Motor: RUE 5/5 RLE 4/5 LUE unable to flex at the wrist. Able to lift antigravity and bend at the elbow. Shoulder 4/5. Grip 1-2/5 - improving LLE 4/5 strength Sensory: Sensation is symmetric to light touch in the arms and legs. Cerebellar: FNF intact, slower on the left due to weakness  Medications  Current Facility-Administered Medications:    0.9 %  sodium chloride infusion, 250 mL, Intravenous, Continuous, Cheri Fowler, MD, Stopped at 05/31/23 1438   0.9 %  sodium chloride infusion, ,  Intravenous, PRN, Cheri Fowler, MD, Last Rate: 10 mL/hr at 06/08/23 1212, New Bag at 06/08/23 1212   acetaminophen (TYLENOL) tablet 650 mg, 650 mg, Oral, Q6H PRN, [name], DO, 650 mg at 06/09/23 2309   apixaban (ELIQUIS) tablet 5 mg, 5 mg, Oral, BID, Spence, Sarah, DO, 5 mg at 06/09/23 2309   arformoterol (BROVANA) nebulizer solution 15 mcg, 15 mcg, Nebulization, BID, Lorin Glass, MD, 15 mcg at 06/09/23 0749   budesonide (PULMICORT) nebulizer solution 0.5 mg, 0.5 mg, Nebulization, BID, Lidia Collum, PA-C, 0.5 mg at 06/09/23 0749   carvedilol (COREG) tablet 3.125 mg, 3.125 mg, Oral, BID WC, Jodelle Red, MD, 3.125 mg at 06/09/23 1738   docusate (COLACE) 50 MG/5ML liquid 100 mg, 100 mg, Oral, BID PRN, Lynnell Catalan, MD   doxepin (SINEQUAN) capsule 10 mg, 10 mg, Oral, QHS PRN, Maxwell, Allee, MD   feeding supplement (BOOST / RESOURCE BREEZE) liquid 1 Container, 1 Container, Oral, TID BM, Mannam, Praveen, MD   folic acid (FOLVITE) tablet 1 mg, 1 mg, Oral, Daily, Maxwell, Allee, MD, 1 mg at 06/09/23 0905   hydrALAZINE (APRESOLINE) tablet 50 mg, 50 mg, Oral, Q8H, Dameron, Marisa, DO, 50 mg at 06/10/23 0704   ipratropium-albuterol (DUONEB) 0.5-2.5 (3) MG/3ML nebulizer solution 3 mL, 3 mL, Nebulization, Q4H PRN, Pia Mau D, PA-C, 3 mL at 06/01/23 2031   isosorbide mononitrate (IMDUR) 24 hr tablet 30 mg, 30 mg, Oral, Daily, Laurey Morale, MD, 30 mg at 06/09/23 [phone]  levETIRAcetam (KEPPRA) IVPB 1000 mg/100 mL premix, 1,000 mg, Intravenous, Q12H, Caryl Pina, MD, Stopped at 06/09/23 2350   lip balm (CARMEX) ointment, , Topical, PRN, Lynnell Catalan, MD   LORazepam (ATIVAN) injection 2 mg, 2 mg, Intravenous, Q5 min PRN, Caryl Pina, MD   OLANZapine (ZYPREXA) tablet 5 mg, 5 mg, Oral, QHS, Spence, Sarah, DO, 5 mg at 06/09/23 2309   ondansetron (ZOFRAN) tablet 4 mg, 4 mg, Oral, Q8H PRN, Darral Dash, DO   Oral care mouth rinse, 15 mL, Mouth Rinse, 4 times per day, Lynnell Catalan, MD, 15 mL at 06/09/23 2315   Oral care mouth rinse, 15 mL, Mouth Rinse, PRN, Agarwala, Ravi, MD   pantoprazole (PROTONIX) EC tablet 40 mg, 40 mg, Oral, Q12H, Dameron, Marisa, DO, 40 mg at 06/09/23 0904   phenylephrine-shark liver oil-mineral oil-petrolatum (PREPARATION H) rectal ointment 1 Application, 1 Application, Rectal, BID PRN, Evette Georges, MD   polyethylene glycol (MIRALAX / GLYCOLAX) packet 17 g, 17 g, Oral, Daily, Dahbura, Anton, DO   [COMPLETED] potassium chloride SA (KLOR-CON M) CR tablet 40 mEq, 40 mEq, Oral, Once, 40 mEq at 06/09/23 1845 **FOLLOWED BY** potassium chloride SA (KLOR-CON M) CR tablet 40 mEq, 40 mEq, Oral, Once, Sowell, Brandon, MD   sodium chloride flush (NS) 0.9 % injection 10-40 mL, 10-40 mL, Intracatheter, Q12H, Lorin Glass, MD, 10 mL at 06/09/23 2325   sodium chloride flush (NS) 0.9 % injection 10-40 mL, 10-40 mL, Intracatheter, PRN, Lorin Glass, MD   spironolactone (ALDACTONE) tablet 25 mg, 25 mg, Oral, Daily, Jodelle Red, MD, 25 mg at 06/09/23 1143   thiamine (VITAMIN B1) tablet 100 mg, 100 mg, Oral, Daily, Agarwala, Ravi, MD, 100 mg at 06/09/23 0905   white petrolatum (VASELINE) gel, , Topical, PRN, Cheri Fowler, MD   witch hazel-glycerin (TUCKS) pad, , Topical, PRN, Shelby Mattocks, DO  Labs CBC    Component Value Date/Time   WBC 8.8 06/10/2023 0659   RBC 4.21 (L) 06/10/2023 0659   HGB 12.4 (L) 06/10/2023 0659   HGB 12.7 (L) 04/04/2023 1040   HCT 37.8 (L) 06/10/2023 0659   HCT 29.3 (L) 05/25/2023 1532   PLT 271 06/10/2023 0659   PLT 235 04/04/2023 1040   MCV 89.8 06/10/2023 0659   MCV 95 04/04/2023 1040   MCH 29.5 06/10/2023 0659   MCHC 32.8 06/10/2023 0659   RDW 20.2 (H) 06/10/2023 0659   RDW 11.4 (L) 04/04/2023 1040   LYMPHSABS 2.6 06/09/2023 0801   LYMPHSABS 3.4 (H) 04/04/2023 1040   MONOABS 0.7 06/09/2023 0801   EOSABS 0.0 06/09/2023 0801   EOSABS 0.0 04/04/2023 1040   BASOSABS 0.1 06/09/2023 0801   BASOSABS 0.0  04/04/2023 1040    CMP     Component Value Date/Time   NA 139 06/10/2023 0659   NA 139 05/22/2023 1213   K 3.7 06/10/2023 0659   CL 111 06/10/2023 0659   CO2 18 (L) 06/10/2023 0659   GLUCOSE 76 06/10/2023 0659   BUN 10 06/10/2023 0659   BUN 9 05/22/2023 1213   CREATININE 1.43 (H) 06/10/2023 0659   CREATININE 1.03 10/19/2016 1017   CALCIUM 7.9 (L) 06/10/2023 0659   PROT 5.3 (L) 06/06/2023 0458   PROT 5.8 (L) 02/13/2023 1725   ALBUMIN 2.6 (L) 06/07/2023 0412   ALBUMIN 3.5 (L) 02/13/2023 1725   AST 35 06/06/2023 0458   ALT 61 (H) 06/06/2023 0458   ALKPHOS 70 06/06/2023 0458   BILITOT 1.7 (H) 06/06/2023 0458   BILITOT <  0.2 02/13/2023 1725   GFRNONAA >60 06/10/2023 0659   GFRNONAA >89 10/19/2016 1017   GFRAA 138 02/04/2020 1101   GFRAA >89 10/19/2016 1017    Lipid Panel     Component Value Date/Time   TRIG 66 05/31/2023 0400    Lab Results  Component Value Date   HGBA1C 5.7 (H) 05/26/2023     Imaging I have reviewed images in epic and the results pertinent to this consultation are:  CT scan of the brain: New areas of cortical/subcortical hypoattenuation within both superior frontal lobes and the superior right parietal lobe, concerning for acute to subacute infarcts. ASPECTS is 8.  MRI examination of the brain- Multifocal large regions of diffuse/confluent cortical diffusion restriction within the frontal lobes with areas of edema also affecting the cingulate gyrus and left parietal lobe,  LTM EEG 06/07/2023 1305 to 06/08/2023 1645:  This study showed evidence of epileptogenicity arising from frontocentral region. One seizure without clinical signs was noted on 06/07/2023 at 1452 arising from right frontocentral region lasting for about 18 seconds. Five seizures were recorded on 06/07/2023 at 1732, 1827, 1905, 1949 and 2037 during which patient initially had no clinical signs. This was followed by left face deviation which then evolved into eye flutter as well as subtle head  jerking.  Seizures were noted to be arising from right frontocentral region.  Average duration of seizures was about 2 to 3 minutes.  EEG: 06/08/2023 1645 to 06/09/2023 0900  ABNORMALITY: - Sharp waves, right frontocentral region IMPRESSION: This study showed evidence of epileptogenicity arising from frontocentral region. No further seizures were noted.  Assessment:  32 year old male with a history of epilepsy who presented with breakthrough seizures c/b cardiac arrest x 3, each with ROSC after a short time interval. Neurology was re-consulted on Wednesday 10/2 for focal seizure recurrence followed by brief GTC activity and then focal motor status epilepticus. Patient received multiple doses of ativan for seizure recurrence and then was noted to have left arm weakness.  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  - Unable to complete neurological exam Friday due to patient being uncooperative.  - Today he participates in exam and interview. LUE weakness is improved.   - Cortical edema may be secondary to his short episodes of cardiac arrest versus seizure-related edema or a combination thereof - LTM EEG report for today AM: Sharp waves, right frontocentral region. This study showed evidence of epileptogenicity arising from frontocentral region. No seizures were noted.  - Will need continued EEG monitoring and kidney functions for titration of Keppra - Continue Keppra 1000mg  BID. If his renal function worsens, will adjust as needed for renal function:  - Cardiac: Per Cardiology, there is no indication for monitor or pacemaker in this patient with PEA arrest most likely provoked by seizure. Has had no other issues on telemetry. - Per Dr. Melynda Ripple, compliance has been an issue with him and due to history of high levels of EtOH use, it might be hard to give him too much rescue meds but  would ideally prescribe rescue meds and tell family to keep one with patient and rest of the prescription with family to avoid abuse. Will need to address this with the patient when he is more awake.    Recommendations: - Inpatient seizure precautions  - Continue Keppra at 1000 mg BID - Discontinue LTM - Ativan PRN  for seizures    Patient seen and examined by NP/APP Elmer Picker, DNP, FNP-BC [facility] Pager: [phone]   Electronically signed: Dr. Caryl Pina

## 2023-06-10 NOTE — Assessment & Plan Note (Addendum)
K 3.7 this AM. -Check in AM and replete as needed - Cardiology questions possible hyperaldosteronism with persistently low K and elevated BPs. Will consider adding spironolactone once renal fxn improves.

## 2023-06-10 NOTE — Progress Notes (Signed)
FMTS Brief Progress Note  S: Went to bedside to assess patient on nighttime rounds.  Patient states that he is doing well.  No known seizure today per patient.  He says that nothing is hurting on him.  He is mentating well.   O: BP (!) 157/99 (BP Location: Right Arm)   Pulse 90   Temp 97.8 F (36.6 C) (Oral)   Resp 20   Wt 74.8 kg   SpO2 97%   BMI 26.62 kg/m    GEN: NAD, awake, alert, responsive to all questions Resp: Clear to auscultation bilaterally, normal work of breathing on room air CV: Regular rate and rhythm Extremities: No lower extremity edema or calf tenderness  A/P:  Seizures  None today per documentation or per patient.  Keppra was increased today. -Monitor closely overnight -Ativan and orders for breakthrough seizures, please notify team if this occurs  AKI Downtrending, renal following  DVT On Eliquis. Monitor closely given hx of hemoperitoneum and DIC in hospital.  S/p cardiac arrest No cardiac symptoms currently -Monitor closely - Orders reviewed. Labs for AM ordered, which was adjusted as needed.  - If condition changes, plan includes touch base with PCCM  Levin Erp, MD 06/10/2023, 9:56 PM PGY-3, [facility] Night Resident  Please page [phone] with questions.

## 2023-06-10 NOTE — PMR Pre-admission (Incomplete)
PMR Admission Coordinator Pre-Admission Assessment  Patient: Timothy Hubbard is an 32 y.o., male MRN: [id number] DOB: 1991-01-05 Height:   Weight: 74.8 kg  Insurance Information HMO:     PPO:      PCP:      IPA:      80/20: yes     OTHER:  PRIMARY: Medicare A & B      Policy#: [id number]      Subscriber: patient CM Name:       Phone#:      Fax#:  Pre-Cert#:       Employer:  Benefits:  Phone #: verified eligibility via [employer] on 06/10/23     Name:  Eff. Date: Part A & B effective 02/03/13     Deduct: $1,632 Out of Pocket Max: NA      Life Max: NA CIR: 100% coverage      SNF: 100% coverage for days 1-20, 80% coverage for days 21-100 Outpatient: 80% coverage     Co-Pay: 20%  Home Health: 100% coverage      Co-Pay:  DME: 80% coverage     Co-Pay: 20% Providers: pt's choice SECONDARY: Medicaid of [place]      Policy#: [id number]     Phone#: [phone]  Financial Counselor:       Phone#:   The "Data Collection Information Summary" for patients in Inpatient Rehabilitation Facilities with attached "Privacy Act Statement-Health Care Records" was provided and verbally reviewed with: {CHL IP Patient Family IO:[id number]}  Emergency Contact Information Contact Information     Name Relation Home Work Mobile   Deese,Jacqueline Mother [phone]  [phone]      Other Contacts     Name Relation Home Work Mobile   White,Fred Father   [phone]       Current Medical History  Patient Admitting Diagnosis: debility s/p cardiac arrest and seizures History of Present Illness: Pt is a 32 year old male with medical hx significant for: seizures, hypertension, asthma, psychiatric disorder*** Pt presented to [facility] on 05/25/23 d/t seizure resulting in unresponsiveness. In transport, pt had episodes of bradycardia and lost pulses. Pt received one round of CPR with ROSC. In ED, pt lost pulses and CPR/ACLS was initiated. Recommended continuous EEG. There was ROSC. Pt intubated. Neurology  consulted. Pt extubated on 9/20. Continuous EEG on 9/19-9/20 was suggestive of moderate diffuse encephalopathy. No seizures seen. Echo on 9/20 showed EF 60-65%. Code blue called on 9/21. Pt received two rounds of CPR with a return of pulse.  Within minutes became bradycardic again and less responsive. Pt intubated. Pt was given 4 units PRBC, 4  units FFP, 2 units platelets and 1 unit cryo. Bedside ultrasound showed large volume of intra-peritoneal fluid. Fluid aspirated and appears to be flank blood. Surgery consulted. CTA on 9/21 didn't show a source of active extravasation. Continuous EEG on 9/21-9/22 was suggestive of profound diffuse encephalopathy which gradually improved to sever diffuse encephalopathy. No seizures seen. Echo on 9/22 showed EF 20-25%. LTM EEG on 9/22-9/23 was suggestive of severe diffuse encephalopathy. No seizures seen. Duplex on 9/23  revealed DVT involving right peroneal veins. Hematology consulted. LTM EEG  on 9/23-9/24 was suggestive of severe diffuse encephalopathy. No seizures seen. Chest x-ray showed large right pleural effusion. LTM EEG on 9/24-9/25 was suggestive of moderate diffuse encephalopathy. No seizures seen. PT extubated on 9/26. Repeat Echo showed EF 55-60%. Left hand tremor noted on 10/2. EEG not consistent with seizure. Tremor progressed to GTC seizure followed by persistent  LUE jerking. IV ativan and Keppra administered. Code Stroke called d/t left arm weakness. CT head showed hypodensities in bifrontal hemispheres-age indeterminate. 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 LTM EEG on 10/2-10/3 showed evidence of epileptogenicity arising from frontocentral region.Six seizures noted. LTM EEG on 10/3-10/4 noted no seizures. LTM EEG on 10/4-10/5 noted no seizures.***  LTM EEG discontinued on 10/5. Therapy evaluations completed and CIR recommended d/t pt's deficits in functional mobility.  Complete NIHSS TOTAL: 8  Patient's medical record  from [facility] has been reviewed by the rehabilitation admission coordinator and physician.  Past Medical History  Past Medical History:  Diagnosis Date   Acute encephalopathy 03/10/2022   Adenomatous polyp of descending colon    Asthma    Encephalopathy acute 03/01/2022   Hematochezia    HTN (hypertension)    Hypokalemia 03/04/2022   Need for hepatitis C screening test 04/27/2022   Seizures (HCC)     Has the patient had major surgery during 100 days prior to admission? No  Family History   family history includes Cancer in his maternal grandfather; Hypertension in his mother.  Current Medications  Current Facility-Administered Medications:    0.9 %  sodium chloride infusion, 250 mL, Intravenous, Continuous, Cheri Fowler, MD, Stopped at 05/31/23 1438   0.9 %  sodium chloride infusion, , Intravenous, PRN, Cheri Fowler, MD, Last Rate: 10 mL/hr at 06/10/23 1117, New Bag at 06/10/23 1117   acetaminophen (TYLENOL) tablet 650 mg, 650 mg, Oral, Q6H PRN, [name], DO, 650 mg at 06/09/23 2309   apixaban (ELIQUIS) tablet 5 mg, 5 mg, Oral, BID, Spence, Sarah, DO, 5 mg at 06/10/23 1106   arformoterol (BROVANA) nebulizer solution 15 mcg, 15 mcg, Nebulization, BID, Lorin Glass, MD, 15 mcg at 06/09/23 0749   budesonide (PULMICORT) nebulizer solution 0.5 mg, 0.5 mg, Nebulization, BID, Lidia Collum, PA-C, 0.5 mg at 06/09/23 0749   carvedilol (COREG) tablet 3.125 mg, 3.125 mg, Oral, BID WC, Jodelle Red, MD, 3.125 mg at 06/10/23 1106   docusate (COLACE) 50 MG/5ML liquid 100 mg, 100 mg, Oral, BID PRN, Lynnell Catalan, MD   doxepin (SINEQUAN) capsule 10 mg, 10 mg, Oral, QHS PRN, Jena Gauss, Allee, MD   feeding supplement (BOOST / RESOURCE BREEZE) liquid 1 Container, 1 Container, Oral, TID BM, Mannam, Praveen, MD   folic acid (FOLVITE) tablet 1 mg, 1 mg, Oral, Daily, Maxwell, Allee, MD, 1 mg at 06/10/23 1106   hydrALAZINE (APRESOLINE) tablet 50 mg, 50 mg, Oral, Q8H, Dameron,  Marisa, DO, 50 mg at 06/10/23 0704   ipratropium-albuterol (DUONEB) 0.5-2.5 (3) MG/3ML nebulizer solution 3 mL, 3 mL, Nebulization, Q4H PRN, Suzie Portela, John D, PA-C, 3 mL at 06/01/23 2031   isosorbide mononitrate (IMDUR) 24 hr tablet 30 mg, 30 mg, Oral, Daily, Laurey Morale, MD, 30 mg at 06/10/23 1106   levETIRAcetam (KEPPRA) IVPB 1000 mg/100 mL premix, 1,000 mg, Intravenous, Q12H, Caryl Pina, MD, Stopped at 06/09/23 2350   lip balm (CARMEX) ointment, , Topical, PRN, Agarwala, Ravi, MD   LORazepam (ATIVAN) injection 2 mg, 2 mg, Intravenous, Q5 min PRN, Caryl Pina, MD   OLANZapine (ZYPREXA) tablet 5 mg, 5 mg, Oral, QHS, Spence, Sarah, DO, 5 mg at 06/09/23 2309   ondansetron (ZOFRAN) tablet 4 mg, 4 mg, Oral, Q8H PRN, Dameron, Marisa, DO   Oral care mouth rinse, 15 mL, Mouth Rinse, 4 times per day, Lynnell Catalan, MD, 15 mL at 06/10/23 1109   Oral care mouth rinse, 15  mL, Mouth Rinse, PRN, Agarwala, Ravi, MD   pantoprazole (PROTONIX) EC tablet 40 mg, 40 mg, Oral, Q12H, Dameron, Marisa, DO, 40 mg at 06/10/23 1107   phenylephrine-shark liver oil-mineral oil-petrolatum (PREPARATION H) rectal ointment 1 Application, 1 Application, Rectal, BID PRN, Evette Georges, MD   polyethylene glycol (MIRALAX / GLYCOLAX) packet 17 g, 17 g, Oral, Daily, Dahbura, Anton, DO   [COMPLETED] potassium chloride SA (KLOR-CON M) CR tablet 40 mEq, 40 mEq, Oral, Once, 40 mEq at 06/09/23 1845 **FOLLOWED BY** potassium chloride SA (KLOR-CON M) CR tablet 40 mEq, 40 mEq, Oral, Once, Sowell, Brandon, MD   sodium chloride flush (NS) 0.9 % injection 10-40 mL, 10-40 mL, Intracatheter, Q12H, Lorin Glass, MD, 10 mL at 06/09/23 2325   sodium chloride flush (NS) 0.9 % injection 10-40 mL, 10-40 mL, Intracatheter, PRN, Lorin Glass, MD   spironolactone (ALDACTONE) tablet 25 mg, 25 mg, Oral, Daily, Jodelle Red, MD, 25 mg at 06/10/23 1107   thiamine (VITAMIN B1) tablet 100 mg, 100 mg, Oral, Daily, Agarwala, Ravi, MD, 100 mg  at 06/09/23 0102   white petrolatum (VASELINE) gel, , Topical, PRN, Cheri Fowler, MD   witch hazel-glycerin (TUCKS) pad, , Topical, PRN, Shelby Mattocks, DO  Patients Current Diet:  Diet Order             Diet regular Room service appropriate? Yes; Fluid consistency: Thin  Diet effective now                   Precautions / Restrictions Precautions Precautions: Other (comment) Precaution Comments: impaired cognition Restrictions Weight Bearing Restrictions: No   Has the patient had 2 or more falls or a fall with injury in the past year? No  Prior Activity Level    Prior Functional Level Self Care: Did the patient need help bathing, dressing, using the toilet or eating? Needed some help  Indoor Mobility: Did the patient need assistance with walking from room to room (with or without device)? Independent  Stairs: Did the patient need assistance with internal or external stairs (with or without device)? Needed some help  Functional Cognition: Did the patient need help planning regular tasks such as shopping or remembering to take medications? Needed some help  Patient Information Are you of Hispanic, Latino/a,or Spanish origin?: A. No, not of Hispanic, Latino/a, or Spanish origin What is your race?: B. Black or African American Do you need or want an interpreter to communicate with a doctor or health care staff?: 0. No  Patient's Response To:  Health Literacy and Transportation Is the patient able to respond to health literacy and transportation needs?: Yes Health Literacy - How often do you need to have someone help you when you read instructions, pamphlets, or other written material from your doctor or pharmacy?: Sometimes In the past 12 months, has lack of transportation kept you from medical appointments or from getting medications?: No In the past 12 months, has lack of transportation kept you from meetings, work, or from getting things needed for daily living?:  No  Home Assistive Devices / Equipment Home Equipment: Agricultural consultant (2 wheels), BSC/3in1, Information systems manager  Prior Device Use: Indicate devices/aids used by the patient prior to current illness, exacerbation or injury?  cane  Current Functional Level Cognition  Overall Cognitive Status: Impaired/Different from baseline Current Attention Level: Sustained Orientation Level: Oriented X4 Following Commands: Follows one step commands consistently Safety/Judgement: Decreased awareness of deficits General Comments: At times irrelevant verbalizations    Extremity Assessment (includes  Sensation/Coordination)  Upper Extremity Assessment: Right hand dominant, RUE deficits/detail, LUE deficits/detail RUE Deficits / Details: generally weak, using functionally for BADL LUE Deficits / Details: LUE with new weakness after seizure yesterday (10/2) with no active wrist flexion/extension, radial/ulnar deviation, and trace activation of digit flexion. Poor GM coordination. Wrist flexion/extension 1-1+ on modified ashworth scale LUE Sensation: decreased light touch LUE Coordination: decreased fine motor  Lower Extremity Assessment: Defer to PT evaluation    ADLs  Overall ADL's : Needs assistance/impaired Eating/Feeding: Set up, Sitting Eating/Feeding Details (indicate cue type and reason): taking medication at EOb Grooming: Set up, Bed level Upper Body Bathing: Moderate assistance, Bed level, Cueing for compensatory techniques Lower Body Bathing: Maximal assistance, Cueing for compensatory techniques, Bed level Upper Body Dressing : Minimal assistance, Sitting Lower Body Dressing: Maximal assistance, Sit to/from stand, Sitting/lateral leans Toilet Transfer: Minimal assistance Toilet Transfer Details (indicate cue type and reason): STS this session; continuous EEG Toileting- Clothing Manipulation and Hygiene: Maximal assistance, Sit to/from stand General ADL Comments: Pt with decreased activity tolerance.     Mobility  Overal bed mobility: Needs Assistance Bed Mobility: Supine to Sit, Sit to Supine Supine to sit: HOB elevated, Used rails, Contact guard Sit to supine: Contact guard assist General bed mobility comments: Assist for safety and to manage lines    Transfers  Overall transfer level: Needs assistance Equipment used: Rolling walker (2 wheels), None Transfers: Sit to/from Stand Sit to Stand: Min assist General transfer comment: Assist for stability and safety    Ambulation / Gait / Stairs / Wheelchair Mobility  Ambulation/Gait Ambulation/Gait assistance: Editor, commissioning (Feet): 10 Feet (10' x 1, 6' x 1) Assistive device: None, Rolling walker (2 wheels) Gait Pattern/deviations: Step-through pattern, Decreased stride length, Narrow base of support General Gait Details: Assist for stability and safety. Unable to use walker effectively due to lt hand weakness. Did better without assistive device. Distance limited by continuous EEG monitoring Gait velocity: decr Gait velocity interpretation: 1.31 - 2.62 ft/sec, indicative of limited community ambulator    Posture / Balance Dynamic Sitting Balance Sitting balance - Comments: EOB with supervision Balance Overall balance assessment: Needs assistance Sitting-balance support: No upper extremity supported, Feet supported Sitting balance-Leahy Scale: Good Sitting balance - Comments: EOB with supervision Standing balance support: No upper extremity supported Standing balance-Leahy Scale: Fair Standing balance comment: RW in standing    Special needs/care consideration Continuous Drip IV  0.9% sodium chloride infusion, Bowel and Bladder incontinence, External Urinary Catheter and Skin Abrasion: arm/bilateral; Blister: arm/right; Ecchymosis: arm/bilateral; Pressure injury: heel/left, lateral; Surgical incision: flank;lower, right   Previous Home Environment (from acute therapy documentation) Living Arrangements:  Parent Available Help at Discharge: Family, Available PRN/intermittently, Personal care attendant (mother) Type of Home: House Home Layout: One level Home Access: Stairs to enter Entrance Stairs-Rails: Can reach both Entrance Stairs-Number of Steps: 4 Bathroom Shower/Tub: Engineer, manufacturing systems: Standard Home Care Services: No  Discharge Living Setting Does the patient have any problems obtaining your medications?: No  Social/Family/Support Systems    Goals    Decrease burden of Care through IP rehab admission: NA  Possible need for SNF placement upon discharge: Not anticipated  Patient Condition: I have reviewed medical records from [facility], spoken with CM, and patient and family member. I met with patient at the bedside and discussed via phone for inpatient rehabilitation assessment.  Patient will benefit from ongoing {CHL IP PT OT JXB:[id number]}, can actively participate in 3 hours of therapy a day  5 days of the week, and can make measurable gains during the admission.  Patient will also benefit from the coordinated team approach during an Inpatient Acute Rehabilitation admission.  The patient will receive intensive therapy as well as Rehabilitation physician, nursing, social worker, and care management interventions.  Due to bladder management, bowel management, safety, skin/wound care, disease management, medication administration, pain management, and patient education the patient requires 24 hour a day rehabilitation nursing.  The patient is currently *** with mobility and basic ADLs.  Discharge setting and therapy post discharge at home with home health is anticipated.  Patient has agreed to participate in the Acute Inpatient Rehabilitation Program and will admit {Time; today/tomorrow:10263}.  Preadmission Screen Completed By:  Domingo Pulse, 06/10/2023 11:35 AM ______________________________________________________________________   Discussed status  with Dr. Marland Kitchen on *** at *** and received approval for admission today.  Admission Coordinator:  Domingo Pulse, CCC-SLP, time ***/Date ***   Assessment/Plan: Diagnosis: Does the need for close, 24 hr/day Medical supervision in concert with the patient's rehab needs make it unreasonable for this patient to be served in a less intensive setting? {yes_no_potentially:3041433} Co-Morbidities requiring supervision/potential complications: *** Due to {due ZO:1096045}, does the patient require 24 hr/day rehab nursing? {yes_no_potentially:3041433} Does the patient require coordinated care of a physician, rehab nurse, PT, OT, and SLP to address physical and functional deficits in the context of the above medical diagnosis(es)? {yes_no_potentially:3041433} Addressing deficits in the following areas: {deficits:3041436} Can the patient actively participate in an intensive therapy program of at least 3 hrs of therapy 5 days a week? {yes_no_potentially:3041433} The potential for patient to make measurable gains while on inpatient rehab is {potential:3041437} Anticipated functional outcomes upon discharge from inpatient rehab: {functional outcomes:304600100} PT, {functional outcomes:304600100} OT, {functional outcomes:304600100} SLP Estimated rehab length of stay to reach the above functional goals is: *** Anticipated discharge destination: {anticipated dc setting:21604} 10. Overall Rehab/Functional Prognosis: {potential:3041437}   MD Signature: ***

## 2023-06-10 NOTE — Progress Notes (Signed)
Orthopedic Tech Progress Note Patient Details:  BASIL BUFFIN 12-02-1990 [id number] Resting hand and velcro wrist cockup splint were delivered to bedside.  Ortho Devices Type of Ortho Device: Velcro wrist splint Ortho Device/Splint Location: LUE Ortho Device/Splint Interventions: Ordered   Post Interventions Patient Tolerated: Well Instructions Provided: Care of device  Loreal Schuessler E Rafel Garde 06/10/2023, 3:52 PM

## 2023-06-10 NOTE — Assessment & Plan Note (Signed)
-   Cont pulmicort and brovana - Duonebs q4h prn

## 2023-06-10 NOTE — Assessment & Plan Note (Signed)
S/p PEA arrest 9/19 and 9/21, now extubated, off pressors and transitioned out of ICU. - Cardiology consulted, appreciate recs

## 2023-06-10 NOTE — Assessment & Plan Note (Addendum)
Thought to be ATN 2/2 cardiac arrest. Cr as high as 4.6; slight bump from yesterday, today 1.43. ANCA workup negative. Lasix d/c'd by cardiology. - Nephrology consulted, appreciate recs - Strict I/O's - Daily weights - Avoid nephrotoxic drugs - Preferred narcotics for pain control will be hydromorphone, fentanyl, and methadone  - AM BMP - will monitor Cr for titration of Keppra per neurology

## 2023-06-10 NOTE — Progress Notes (Signed)
Patient wanted arm wrapped. There are no wounds on left arm or hand . OT consulted for possible brace while continuing therapy with movement.

## 2023-06-10 NOTE — Progress Notes (Signed)
LTM maint complete - no new skin breakdown . Patient reused to have leads repaired. [facility] monitored, Event button test confirmed by [facility].

## 2023-06-11 DIAGNOSIS — I469 Cardiac arrest, cause unspecified: Secondary | ICD-10-CM | POA: Diagnosis not present

## 2023-06-11 DIAGNOSIS — R569 Unspecified convulsions: Secondary | ICD-10-CM | POA: Diagnosis not present

## 2023-06-11 LAB — BASIC METABOLIC PANEL
Anion gap: 13 (ref 5–15)
BUN: 7 mg/dL (ref 6–20)
CO2: 16 mmol/L — ABNORMAL LOW (ref 22–32)
Calcium: 8.2 mg/dL — ABNORMAL LOW (ref 8.9–10.3)
Chloride: 111 mmol/L (ref 98–111)
Creatinine, Ser: 1.32 mg/dL — ABNORMAL HIGH (ref 0.61–1.24)
GFR, Estimated: >60 mL/min (ref >60)
Glucose, Bld: 84 mg/dL (ref 70–99)
Potassium: 3.9 mmol/L (ref 3.5–5.1)
Sodium: 140 mmol/L (ref 135–145)

## 2023-06-11 MED ORDER — LOSARTAN POTASSIUM 50 MG PO TABS
50.0000 mg | ORAL_TABLET | Freq: Every day | ORAL | Status: DC
  Administered 2023-06-11 – 2023-06-12 (×2): 50 mg via ORAL
  Filled 2023-06-11 (×2): qty 1

## 2023-06-11 NOTE — Assessment & Plan Note (Signed)
S/p PEA arrest 9/19 and 9/21, now extubated, off pressors and transitioned out of ICU.  - Cardiology consulted, appreciate recs

## 2023-06-11 NOTE — Assessment & Plan Note (Signed)
Uncontrolled.  Continuing medications as below per cardiology, difficult to control given renal function and other comorbidities. - Cont IV Hydralazine 50 mg q8 with Imdur 30 mg, per Cards - Holding home amlodipine, lisinopril

## 2023-06-11 NOTE — Assessment & Plan Note (Signed)
-   Cont pulmicort and brovana - Duonebs q4h prn

## 2023-06-11 NOTE — Assessment & Plan Note (Signed)
2/2 cardiac arrest. Transaminases have been steadily downtrending. Abdominal exam continues to be benign.

## 2023-06-11 NOTE — Assessment & Plan Note (Signed)
S/p 4 units PRBC, 4 units FFP, and 2 units platelets and 1 unit of cryo.  Globin and platelets have been stable.  Hgb stable as above. - Continue Eliquis for 6 months per hematology.

## 2023-06-11 NOTE — Assessment & Plan Note (Signed)
Tonic-clonic seizure 10/2. LTM EEG with evidence of epileptogenicity arising from frontocentral region. - Neurology consulted, appreciate recs.  -Continue Keppra 1000mg  BID per neurology - Seizure precautions - AM BMP, magnesium

## 2023-06-11 NOTE — Assessment & Plan Note (Signed)
Repeat echo on 05/26/2023 shows recovered EF 55 to 60%. - Cont imdur 30mg  daily - Cardiology consulted, appreciate recs

## 2023-06-11 NOTE — Assessment & Plan Note (Signed)
Per Heme/Onc, will need anticoag with Eliquis for 6 months.  CBC has been stable over multiple checks. -Was transitioned to Lovenox due to swallowing concerns; will restart Eliquis today. -  Eliquis 5 mg BID dosing -CBC as needed

## 2023-06-11 NOTE — Assessment & Plan Note (Signed)
Thought to be ATN 2/2 cardiac arrest. Cr as high as 4.6; now downtrending to 1.32 today 10/6.  ANCA workup negative. Lasix d/c'd by cardiology. - Nephrology consulted, appreciate recs - Strict I/O's - Daily weights - Avoid nephrotoxic drugs - Preferred narcotics for pain control will be hydromorphone, fentanyl, and methadone  - AM BMP - will monitor Cr for titration of Keppra per neurology

## 2023-06-11 NOTE — Progress Notes (Signed)
Neurology Progress Note   S:// Sitting up on edge of the bed eating lunch.  States he is feeling good today.  No further seizure-like activity and tolerating Keppra well.  He tells me that he does remember me from yesterday, remembers being mad at me the day before when discussing how the drugs and alcohol can interact with his medications.  AEDS:// Previous AEDS: Depakote 500mg  BID (03/2022-05/2023) - stopped due to shock liver one day after admission for cardiac arrest Current AEDS: Keppra 1000mg  BID    O:// Current vital signs: BP (!) 136/104 (BP Location: Right Arm)   Pulse (!) 102   Temp 98.3 F (36.8 C) (Oral)   Resp 20   Wt 76.2 kg   SpO2 98%   BMI 27.13 kg/m  Vital signs in last 24 hours: Temp:  [97.8 F (36.6 C)-98.3 F (36.8 C)] 98.3 F (36.8 C) (10/06 0525) Pulse Rate:  [83-102] 102 (10/06 0525) Resp:  [16-20] 20 (10/06 0525) BP: (121-161)/(98-105) 136/104 (10/06 0525) SpO2:  [90 %-98 %] 98 % (10/06 0812) Weight:  [76.2 kg] 76.2 kg (10/06 0515)  GENERAL: Awake, alert in NAD HEENT: - Normocephalic and atraumatic by casual observation   NEURO:  Mental Status: Awake alert, sitting on the side of the bed eating lunch.  Oriented. Speech comprehension and fluency are normal  Cranial Nerves: II: Visual Fields are full. Pupils are equal, round, and reactive to light.   III,IV, VI: EOMI without ptosis or diplopia. VII: Facial movement is symmetric resting and talking VIII: Hearing is intact to voice X: Phonation normal XI: Shoulder shrug is symmetric. XII: Tongue protrudes midline without atrophy or fasciculations.  Motor: RUE 5/5 RLE 4/5 LUE unable to flex at the wrist. Able to lift antigravity and bend at the elbow. Shoulder 4/5. Grip 1-2/5 - improving LLE 4/5 strength Sensory: Sensation is symmetric to light touch in the arms and legs. Cerebellar: FNF intact, slower on the left due to weakness  Medications  Current Facility-Administered Medications:     0.9 %  sodium chloride infusion, 250 mL, Intravenous, Continuous, Cheri Fowler, MD, Stopped at 05/31/23 1438   0.9 %  sodium chloride infusion, , Intravenous, PRN, Cheri Fowler, MD, Last Rate: 10 mL/hr at 06/11/23 0936, New Bag at 06/11/23 0936   acetaminophen (TYLENOL) tablet 650 mg, 650 mg, Oral, Q6H PRN, [name], DO, 650 mg at 06/09/23 2309   apixaban (ELIQUIS) tablet 5 mg, 5 mg, Oral, BID, Spence, Sarah, DO, 5 mg at 06/11/23 0926   arformoterol (BROVANA) nebulizer solution 15 mcg, 15 mcg, Nebulization, BID, Lorin Glass, MD, 15 mcg at 06/11/23 0811   budesonide (PULMICORT) nebulizer solution 0.5 mg, 0.5 mg, Nebulization, BID, Lidia Collum, PA-C, 0.5 mg at 06/11/23 4098   carvedilol (COREG) tablet 3.125 mg, 3.125 mg, Oral, BID WC, Jodelle Red, MD, 3.125 mg at 06/11/23 1191   docusate (COLACE) 50 MG/5ML liquid 100 mg, 100 mg, Oral, BID PRN, Agarwala, Ravi, MD   doxepin (SINEQUAN) capsule 10 mg, 10 mg, Oral, QHS PRN, Jena Gauss, Allee, MD   feeding supplement (BOOST / RESOURCE BREEZE) liquid 1 Container, 1 Container, Oral, TID BM, Mannam, Praveen, MD, 1 Container at 06/10/23 1427   folic acid (FOLVITE) tablet 1 mg, 1 mg, Oral, Daily, Maxwell, Allee, MD, 1 mg at 06/11/23 0926   hydrALAZINE (APRESOLINE) tablet 50 mg, 50 mg, Oral, Q8H, Dameron, Marisa, DO, 50 mg at 06/11/23 0525   ipratropium-albuterol (DUONEB) 0.5-2.5 (3) MG/3ML nebulizer solution 3 mL, 3 mL, Nebulization,  Q4H PRN, Lidia Collum, PA-C, 3 mL at 06/01/23 2031   levETIRAcetam (KEPPRA) IVPB 1000 mg/100 mL premix, 1,000 mg, Intravenous, Q12H, Caryl Pina, MD, Last Rate: 400 mL/hr at 06/11/23 0939, 1,000 mg at 06/11/23 0939   lip balm (CARMEX) ointment, , Topical, PRN, Lynnell Catalan, MD   LORazepam (ATIVAN) injection 2 mg, 2 mg, Intravenous, Q5 min PRN, Caryl Pina, MD   losartan (COZAAR) tablet 50 mg, 50 mg, Oral, Daily, Wendall Stade, MD, 50 mg at 06/11/23 0925   OLANZapine (ZYPREXA) tablet 5 mg, 5 mg, Oral,  QHS, Spence, Sarah, DO, 5 mg at 06/10/23 2035   ondansetron (ZOFRAN) tablet 4 mg, 4 mg, Oral, Q8H PRN, Darral Dash, DO   Oral care mouth rinse, 15 mL, Mouth Rinse, 4 times per day, Lynnell Catalan, MD, 15 mL at 06/10/23 1109   Oral care mouth rinse, 15 mL, Mouth Rinse, PRN, Lynnell Catalan, MD   pantoprazole (PROTONIX) EC tablet 40 mg, 40 mg, Oral, Q12H, Dameron, Marisa, DO, 40 mg at 06/11/23 0925   phenylephrine-shark liver oil-mineral oil-petrolatum (PREPARATION H) rectal ointment 1 Application, 1 Application, Rectal, BID PRN, Evette Georges, MD   polyethylene glycol (MIRALAX / GLYCOLAX) packet 17 g, 17 g, Oral, Daily, Dahbura, Anton, DO   [COMPLETED] potassium chloride SA (KLOR-CON M) CR tablet 40 mEq, 40 mEq, Oral, Once, 40 mEq at 06/09/23 1845 **FOLLOWED BY** potassium chloride SA (KLOR-CON M) CR tablet 40 mEq, 40 mEq, Oral, Once, Sowell, Brandon, MD   sodium chloride flush (NS) 0.9 % injection 10-40 mL, 10-40 mL, Intracatheter, Q12H, Lorin Glass, MD, 10 mL at 06/11/23 0940   sodium chloride flush (NS) 0.9 % injection 10-40 mL, 10-40 mL, Intracatheter, PRN, Lorin Glass, MD   spironolactone (ALDACTONE) tablet 25 mg, 25 mg, Oral, Daily, Jodelle Red, MD, 25 mg at 06/11/23 1610   thiamine (VITAMIN B1) tablet 100 mg, 100 mg, Oral, Daily, Agarwala, Ravi, MD, 100 mg at 06/11/23 9604   white petrolatum (VASELINE) gel, , Topical, PRN, Cheri Fowler, MD   witch hazel-glycerin (TUCKS) pad, , Topical, PRN, Shelby Mattocks, DO  Labs CBC    Component Value Date/Time   WBC 8.8 06/10/2023 0659   RBC 4.21 (L) 06/10/2023 0659   HGB 12.4 (L) 06/10/2023 0659   HGB 12.7 (L) 04/04/2023 1040   HCT 37.8 (L) 06/10/2023 0659   HCT 29.3 (L) 05/25/2023 1532   PLT 271 06/10/2023 0659   PLT 235 04/04/2023 1040   MCV 89.8 06/10/2023 0659   MCV 95 04/04/2023 1040   MCH 29.5 06/10/2023 0659   MCHC 32.8 06/10/2023 0659   RDW 20.2 (H) 06/10/2023 0659   RDW 11.4 (L) 04/04/2023 1040   LYMPHSABS  2.6 06/09/2023 0801   LYMPHSABS 3.4 (H) 04/04/2023 1040   MONOABS 0.7 06/09/2023 0801   EOSABS 0.0 06/09/2023 0801   EOSABS 0.0 04/04/2023 1040   BASOSABS 0.1 06/09/2023 0801   BASOSABS 0.0 04/04/2023 1040    CMP     Component Value Date/Time   NA 140 06/11/2023 0556   NA 139 05/22/2023 1213   K 3.9 06/11/2023 0556   CL 111 06/11/2023 0556   CO2 16 (L) 06/11/2023 0556   GLUCOSE 84 06/11/2023 0556   BUN 7 06/11/2023 0556   BUN 9 05/22/2023 1213   CREATININE 1.32 (H) 06/11/2023 0556   CREATININE 1.03 10/19/2016 1017   CALCIUM 8.2 (L) 06/11/2023 0556   PROT 5.3 (L) 06/06/2023 0458   PROT 5.8 (L) 02/13/2023 1725  ALBUMIN 2.6 (L) 06/07/2023 0412   ALBUMIN 3.5 (L) 02/13/2023 1725   AST 35 06/06/2023 0458   ALT 61 (H) 06/06/2023 0458   ALKPHOS 70 06/06/2023 0458   BILITOT 1.7 (H) 06/06/2023 0458   BILITOT <0.2 02/13/2023 1725   GFRNONAA >60 06/11/2023 0556   GFRNONAA >89 10/19/2016 1017   GFRAA 138 02/04/2020 1101   GFRAA >89 10/19/2016 1017    Lipid Panel     Component Value Date/Time   TRIG 66 05/31/2023 0400    Lab Results  Component Value Date   HGBA1C 5.7 (H) 05/26/2023     Imaging I have reviewed images in epic and the results pertinent to this consultation are:  CT scan of the brain: New areas of cortical/subcortical hypoattenuation within both superior frontal lobes and the superior right parietal lobe, concerning for acute to subacute infarcts. ASPECTS is 8.  MRI examination of the brain- Multifocal large regions of diffuse/confluent cortical diffusion restriction within the frontal lobes with areas of edema also affecting the cingulate gyrus and left parietal lobe,  LTM EEG 06/07/2023 1305 to 06/08/2023 1645:  This study showed evidence of epileptogenicity arising from frontocentral region. One seizure without clinical signs was noted on 06/07/2023 at 1452 arising from right frontocentral region lasting for about 18 seconds. Five seizures were recorded on  06/07/2023 at 1732, 1827, 1905, 1949 and 2037 during which patient initially had no clinical signs. This was followed by left face deviation which then evolved into eye flutter as well as subtle head jerking.  Seizures were noted to be arising from right frontocentral region.  Average duration of seizures was about 2 to 3 minutes.  EEG: 06/08/2023 1645 to 06/09/2023 0900  ABNORMALITY: - Sharp waves, right frontocentral region IMPRESSION: This study showed evidence of epileptogenicity arising from frontocentral region. No further seizures were noted.  LTM EEG report for Saturday AM: Sharp waves, right frontocentral region. This study showed evidence of epileptogenicity arising from frontocentral region. No seizures were noted.   Assessment:  32 year old male with a history of epilepsy who presented with breakthrough seizures c/b cardiac arrest x 3, each with ROSC after a short time interval. Neurology was re-consulted on Wednesday 10/2 for focal seizure recurrence followed by brief GTC activity and then focal motor status epilepticus. Patient received multiple doses of ativan for seizure recurrence and then was noted to have left arm weakness.  - Today he participates in exam and interview. LUE weakness is improved.   - Cortical edema on MRI may be secondary to his short episodes of cardiac arrest versus seizure-related edema or a combination thereof - LTM was discontinued on Saturday - Continue Keppra 1000mg  BID. If his renal function worsens, will adjust as needed for renal function:  - Cardiac: Per Cardiology, there is no indication for monitor or pacemaker in this patient with PEA arrest most likely provoked by seizure. Has had no other issues on telemetry. - Per Dr. Melynda Ripple, compliance has been an issue with him and due to history of high levels of EtOH use, it might be hard to give him too much rescue meds but would ideally prescribe rescue meds and tell family to keep one with patient and rest of the  prescription with family to avoid abuse. Will need to address this with the patient on Monday.    Recommendations: - Inpatient seizure precautions  - Continue Keppra at 1000 mg BID - Ativan PRN for seizures  - No further recommendations other than discussion with family and  patient the need for seizure rescue medication at home with precautions to prevent recreational abuse of Diazepam nasal spray  - Follow up with outpatient neurology   Patient seen and examined by NP/APP Elmer Picker, DNP, FNP-BC [facility] Pager: [phone]   Electronically signed: Dr. Caryl Pina

## 2023-06-11 NOTE — Progress Notes (Signed)
Patient Name: Timothy Hubbard Date of Encounter: 06/11/2023 [facility] Cardiologist: Shirlee Latch  Interval Summary  .    No cardiac events over night   Vital Signs .    Vitals:   06/10/23 1921 06/11/23 0515 06/11/23 0525 06/11/23 0812  BP: (!) 157/99  (!) 136/104   Pulse: 90  (!) 102   Resp: 20  20   Temp: 97.8 F (36.6 C)  98.3 F (36.8 C)   TempSrc: Oral  Oral   SpO2: 97%  97% 98%  Weight:  76.2 kg      Intake/Output Summary (Last 24 hours) at 06/11/2023 0852 Last data filed at 06/10/2023 1900 Gross per 24 hour  Intake 63.51 ml  Output 800 ml  Net -736.49 ml      06/11/2023    5:15 AM 06/10/2023    4:18 AM 06/09/2023    4:09 AM  Last 3 Weights  Weight (lbs) 168 lb 0.9 oz 164 lb 14.5 oz 163 lb 12.8 oz  Weight (kg) 76.23 kg 74.8 kg 74.3 kg      Telemetry/ECG    Heart rates look more improved today not as tachycardic.  Appears he might be in WAP versus sinus arrhythmia at times.  Heart rates 80s to 100 generally.- Personally Reviewed  CV Studies    Echocardiogram 06/03/2023 1. Left ventricular ejection fraction, by estimation, is 55 to 60%. The  left ventricle has normal function. The left ventricle has no regional  wall motion abnormalities. Left ventricular diastolic parameters were  normal.   2. Right ventricular systolic function is normal. The right ventricular  size is normal. There is mildly elevated pulmonary artery systolic  pressure. The estimated right ventricular systolic pressure is 42.5 mmHg.   3. Large pleural effusion in the left lateral region.   4. The mitral valve is normal in structure. Trivial mitral valve  regurgitation. No evidence of mitral stenosis.   5. The aortic valve has an indeterminant number of cusps. Aortic valve  regurgitation is not visualized. No aortic stenosis is present.   6. The inferior vena cava is dilated in size with <50% respiratory  variability, suggesting right atrial pressure of 15 mmHg.   Comparison(s):  Changes from prior study are noted. LVEF improved from 25%  to normal now.   Limited echocardiogram 05/28/2023 1. Limited study.   2. Left ventricular ejection fraction, by estimation, is 20 to 25%. The  left ventricle has severely decreased function. The left ventricle  demonstrates global hypokinesis.   3. RV-RA gradient 21 mmHg suggesting normal to mildly increased estimated  RVSP depending on CVP. Right ventricular systolic function is severely  reduced. The right ventricular size is moderately enlarged.   4. The mitral valve is grossly normal. Mild mitral valve regurgitation.   5. The tricuspid valve is abnormal. Tricuspid valve regurgitation is  moderate to severe.   6. The aortic valve is tricuspid.   7. Unable to estimate CVP.   Comparison(s): Prior images reviewed side by side. LVEF 20-25% with global  hypokinesis and decrease compared with prior study.   Echocardiogram 05/26/2023 1. Left ventricular ejection fraction, by estimation, is 60 to 65%. The  left ventricle has normal function. The left ventricle has no regional  wall motion abnormalities. Left ventricular diastolic parameters were  normal.   2. Right ventricular systolic function is normal. The right ventricular  size is normal.   3. The mitral valve is normal in structure. Trivial mitral valve  regurgitation. No evidence of mitral stenosis.   4. The aortic valve is tricuspid. Aortic valve regurgitation is not  visualized. No aortic stenosis is present.   5. The inferior vena cava is normal in size with greater than 50%  respiratory variability, suggesting right atrial pressure of 3 mmHg.   Physical Exam .   GEN: No acute distress.   Neck: no JVD Cardiac: RRR, no murmurs, rubs, or gallops.  Respiratory: Decreased BS right base/mid GI: Soft, nontender, non-distended  MS: no thigh edema  Patient Profile    Timothy Hubbard is a 32 y.o. male has hx of seizures, hypertension, asthma and admitted on 05/25/2023.   Patient here with PEA arrest x 2 status post CPR with ROSC admitted to the ICU intubated.  Postextubation had another PEA arrest 05/27/2023 with 2 rounds of CPR.  Echocardiogram showed reduced EF 20 to 25%.  Had normalization of EF 06/03/2023.  Course has been complicated by shock liver, DIC AKI, seizure.    Assessment & Plan .     Acute HFrEF Stress cardiomyopathy PEA arrest in the setting of seizures Echocardiogram 05/26/2023 normal EF 60 to 65% after first PEA arrest.  Second PEA arrest occurred on 05/26/2023 with EF 25%, severely reduced RV function.  Repeat echo on 05/26/2023 shows recovered EF 55 to 60%.  Appears to be progressing appropriately with improvement in renal and liver function.   Cr improved to 1.3 d/c imdur and start Cozaar 50 mg daily   Pleural effusions 06/05/2023 noted to be moderate.  Repeat CXR shows no changes, stable for now.   AKI Likely ATN related to postarrest.  Previously normal renal function PTA.  Creatinine as high as 4.6.  Now 1.3  Hypokalemia Has now normalized with aggressive supplementation and mag.   Shock liver Related to postarrest.  ASTnow normal ALT near normal   RLE DVT On eliquis now   Abuse/alcohol abuse Agreeable to cessation.  DIC Required MTP.  Suspected secondary to shock liver.  Hematology following.  Seems stable now with no further bleeding.PLT count 271  For questions or updates, please contact [facility] Please consult [email or web address] for contact info under        Signed, Charlton Haws, MD

## 2023-06-11 NOTE — Assessment & Plan Note (Signed)
Likely 2/2 fluid overload.

## 2023-06-11 NOTE — Progress Notes (Signed)
Occupational Therapy Treatment Patient Details Name: Timothy Hubbard MRN: [id number] DOB: Jul 11, 1991 Today's Date: 06/11/2023   History of present illness 32 y.o. male admitted 9/19 for seizures complicated by PEA cardiac arrest x 2, approximately 3 minutes total downtime. Pt with shock of mixed etiology, cardiogenic and septic due to aspiration PNA. Intubation 9/19-9/20, 9/21 3rd brief PEA arrest and re intubated 9/21-9/26. Course complicated by oliguric AKI, hemoperitoneum and acute anemia in setting of DIC, DVT in RLE. Seizure 10/02 now on continuous EEG. PMhx: seizures, HTN, asthma, mood disorder, substance abuse   OT comments  Pt at this time presented in bed and had L hand propped up as educated in last session. Pt was educated on how to don and doff both splints in session and about wearing schedule to decrease in risk of skin breakdown. Pt was educated about setting an alarm on phone to decrease risk in forgetting to check L hand. Pt did note to make some improvements in gross grasp in L hand at this time and decrease in edema. Pt reported in session if they can not get into [facility] they would like to go home with [facility] services. Acute Occupational Therapy will continue to follow patient at this time to maximize independence and the return to home.       If plan is discharge home, recommend the following:  A little help with walking and/or transfers;A little help with bathing/dressing/bathroom;Assistance with cooking/housework;Help with stairs or ramp for entrance;Assist for transportation;Direct supervision/assist for medications management;Direct supervision/assist for financial management;Supervision due to cognitive status   Equipment Recommendations  Tub/shower bench    Recommendations for Other Services      Precautions / Restrictions Precautions Precautions: Other (comment) Precaution Comments: limited L use of UE Restrictions Weight Bearing Restrictions: No       Mobility Bed  Mobility               General bed mobility comments: Pt declined OOB activity at this time    Transfers                   General transfer comment: Pt declined OOB activity     Balance Overall balance assessment: Needs assistance Sitting-balance support: Feet supported Sitting balance-Leahy Scale: Good                                     ADL either performed or assessed with clinical judgement   ADL Overall ADL's : Needs assistance/impaired Eating/Feeding: Set up;Sitting   Grooming: Bed level;Minimal assistance;Set up   Upper Body Bathing: Minimal assistance;Moderate assistance;Sitting   Lower Body Bathing: Moderate assistance;Sit to/from stand   Upper Body Dressing : Minimal assistance;Sitting   Lower Body Dressing: Moderate assistance;Sit to/from stand   Toilet Transfer: Minimal assistance;Contact guard assist   Toileting- Clothing Manipulation and Hygiene: Moderate assistance         General ADL Comments: Pt declined OOB activity this AM    Extremity/Trunk Assessment Upper Extremity Assessment Upper Extremity Assessment: Right hand dominant;LUE deficits/detail LUE Deficits / Details: Pt noted to have increase use of L hand and noted to have increase in flexion at PIP and DIP. IV was removed from hand as well. LUE Sensation: decreased light touch LUE Coordination: decreased fine motor   Lower Extremity Assessment Lower Extremity Assessment: Defer to PT evaluation        Vision       Perception  Praxis      Cognition Arousal: Alert Behavior During Therapy: WFL for tasks assessed/performed Overall Cognitive Status: Within Functional Limits for tasks assessed                         Following Commands: Follows multi-step commands consistently       General Comments: Pt noted to be able to recall from last session education at this time. Pt did not want to complete OOB acitivtiy at this time and focused on  splint use.        Exercises Exercises: Other exercises Other Exercises Other Exercises: L hand gross grasp activity and education on FM activities to increase in use    Shoulder Instructions       General Comments      Pertinent Vitals/ Pain       Pain Assessment Pain Assessment: No/denies pain  Home Living                                          Prior Functioning/Environment              Frequency  Min 1X/week        Progress Toward Goals  OT Goals(current goals can now be found in the care plan section)  Progress towards OT goals: Progressing toward goals  Acute Rehab OT Goals Patient Stated Goal: to go home OT Goal Formulation: With patient Time For Goal Achievement: 06/17/23 Potential to Achieve Goals: Good ADL Goals Pt Will Perform Lower Body Bathing: with min assist;sitting/lateral leans;sit to/from stand Pt Will Perform Upper Body Dressing: with set-up;sitting Pt Will Perform Lower Body Dressing: with min assist;sitting/lateral leans;sit to/from stand Pt Will Transfer to Toilet: with contact guard assist;ambulating;regular height toilet;grab bars Pt Will Perform Toileting - Clothing Manipulation and hygiene: sitting/lateral leans;sit to/from stand;with min assist Pt/caregiver will Perform Home Exercise Program: Increased strength;Both right and left upper extremity;With theraband;With theraputty;With Supervision;With written HEP provided Additional ADL Goal #2: Pt will be able to follow splint schedule to reduce injury of LUE and increase in function  Plan      Co-evaluation                 AM-PAC OT "6 Clicks" Daily Activity     Outcome Measure   Help from another person eating meals?: None Help from another person taking care of personal grooming?: A Little Help from another person toileting, which includes using toliet, bedpan, or urinal?: A Lot Help from another person bathing (including washing, rinsing, drying)?:  A Lot Help from another person to put on and taking off regular upper body clothing?: A Little Help from another person to put on and taking off regular lower body clothing?: A Lot 6 Click Score: 16    End of Session    OT Visit Diagnosis: Unsteadiness on feet (R26.81);Muscle weakness (generalized) (M62.81);Other (comment);Other symptoms and signs involving cognitive function   Activity Tolerance Patient tolerated treatment well   Patient Left in bed;with call bell/phone within reach   Nurse Communication Other (comment) (splint use)        Time: 0728-0800 OT Time Calculation (min): 32 min  Charges: OT General Charges $OT Visit: 1 Visit OT Treatments $Self Care/Home Management : 23-37 mins  Presley Raddle OTR/L  Acute Rehab Services  [phone] office number   Alphia Moh 06/11/2023, 8:14 AM

## 2023-06-11 NOTE — Progress Notes (Signed)
Daily Progress Note Intern Pager: [phone]  Patient name: Timothy Hubbard Medical record number: [id number] Date of birth: 04/29/1991 Age: 32 y.o. Gender: male  Primary Care Provider: Vonna Drafts, MD Consultants: Cardiology/Heart Failure, Nephrology (signed off 10/3), Oncology (signed off 10/1) Code Status: FULL  Pt Overview and Major Events to Date:  9/19 2 PEA arrests ROSC achieved, admit to ICU; seizure and cardiac arrest 9/20 extubated 05/27/2023: PEA arrest and after woke up and was confused.  Total of 2 rounds of CPR with epi and bicarb x 1.  But became bradycardic > intubated.  Had A-line placed.  Also central line ascites tap did show blood.  Was given 4 units PRBC, 4 units FFP and 2 units platelets and 1 unit of cryo major transfusion protocol Cc consult for hemoperitoneum.  DIC panel positive.  He had some oozing from mouth and IV sites EEG showed symmetric low voltage no/seizure activity 06/01/2023: Extubated 06/03/2023: Transferred to FMTS 06/05/2023: Transitioning to peripheral IV, switching heparin to oral anticoagulant (eliquis)  06/07/2023: Apparent tonic-clonic seizure 06/08/2023: Long-term EEG; passed SLP swallow eval and return to regular diet  Assessment and Plan:  [name]. Enns is a 32 year old male with PMH of seizures, hypertension and asthma who presented after witnessed seizure with PEA arrest requiring intubation now status post acute stroke and awaiting placement with CIR for further rehabilitation. Assessment & Plan Seizure disorder (HCC) Tonic-clonic seizure 10/2. LTM EEG with evidence of epileptogenicity arising from frontocentral region. - Neurology consulted, appreciate recs.  -Continue Keppra 1000mg  BID per neurology - Seizure precautions - AM BMP, magnesium Hypokalemia Stable at 3.9. -Check in AM and replete as needed - Cardiology questions possible hyperaldosteronism with persistently low K and elevated BPs. Will consider adding spironolactone  once renal fxn improves. AKI (acute kidney injury) (HCC) Thought to be ATN 2/2 cardiac arrest. Cr as high as 4.6; now downtrending to 1.32 today 10/6.  ANCA workup negative. Lasix d/c'd by cardiology. - Nephrology consulted, appreciate recs - Strict I/O's - Daily weights - Avoid nephrotoxic drugs - Preferred narcotics for pain control will be hydromorphone, fentanyl, and methadone  - AM BMP - will monitor Cr for titration of Keppra per neurology Acute deep vein thrombosis (DVT) of right peroneal vein (HCC) Per Heme/Onc, will need anticoag with Eliquis for 6 months.  CBC has been stable over multiple checks. -Was transitioned to Lovenox due to swallowing concerns; will restart Eliquis today. -  Eliquis 5 mg BID dosing -CBC as needed Coagulopathy (HCC) S/p 4 units PRBC, 4 units FFP, and 2 units platelets and 1 unit of cryo.  Globin and platelets have been stable.  Hgb stable as above. - Continue Eliquis for 6 months per hematology. Hypertension Uncontrolled.  Continuing medications as below per cardiology, difficult to control given renal function and other comorbidities. - Cont IV Hydralazine 50 mg q8 with Imdur 30 mg, per Cards - Holding home amlodipine, lisinopril  Pressure ulcer of left heel Initially noted 10/3. - Wound care consulted, appreciate recs - wrapped this AM; RN to continue care per [facility] instructions Acute HFrEF (heart failure with reduced ejection fraction) (HCC) Repeat echo on 05/26/2023 shows recovered EF 55 to 60%. - Cont imdur 30mg  daily - Cardiology consulted, appreciate recs Acute respiratory failure with hypercapnia (HCC) - Currently on room air  - Diurese discussed below - Tylenol prn for rib pain.  -Consider further pain management as needed to prevent atelectasis.  - Tx asthma as below - Incentive spirometry +  pulm toilet as indicated - PT/OT for deconditioning Cardiac arrest [facility]) S/p PEA arrest 9/19 and 9/21, now extubated, off pressors and transitioned  out of ICU. - Cardiology consulted, appreciate recs Asthma - Cont pulmicort and brovana - Duonebs q4h prn Pleural effusion, right Likely 2/2 fluid overload. Shock liver 2/2 cardiac arrest. Transaminases have been steadily downtrending. Abdominal exam continues to be benign.  Chronic and Stable Problems:  Mood disorder: olanzapine 5 mg nightly   FEN/GI: Regular diet, Boost 3 times daily PPx: Eliquis Dispo: Patient would likely prefer home health over CIR at this time  Subjective:  Doing well this morning.  He is able to lift his left arm above gravity.  He is able to make a fist and squeeze my fingers gently.  He feels he would more rather go home and go to CIR for further rehabilitation since he is improving.  Objective: Temp:  [97.8 F (36.6 C)-98.3 F (36.8 C)] 98.3 F (36.8 C) (10/06 0525) Pulse Rate:  [83-102] 102 (10/06 0525) Resp:  [16-20] 20 (10/06 0525) BP: (121-161)/(98-105) 136/104 (10/06 0525) SpO2:  [90 %-98 %] 98 % (10/06 0812) Weight:  [76.2 kg] 76.2 kg (10/06 0515) Physical Exam: General: Lying in bed, no acute distress, dysarthric Cardiovascular: Regular rate and rhythm without murmurs rubs or gallops Respiratory: Clear to auscultation bilaterally anteriorly without wheezes rales or rhonchi Abdomen: Soft, nontender nondistended, normoactive bowel sounds Extremities: Left arm strength diffusely 3 out of 5  Laboratory: Most recent CBC Lab Results  Component Value Date   WBC 8.8 06/10/2023   HGB 12.4 (L) 06/10/2023   HCT 37.8 (L) 06/10/2023   MCV 89.8 06/10/2023   PLT 271 06/10/2023   Most recent BMP    Latest Ref Rng & Units 06/11/2023    5:56 AM  BMP  Glucose 70 - 99 mg/dL 84   BUN 6 - 20 mg/dL 7   Creatinine 4.09 - 8.11 mg/dL 9.14   Sodium 782 - 956 mmol/L 140   Potassium 3.5 - 5.1 mmol/L 3.9   Chloride 98 - 111 mmol/L 111   CO2 22 - 32 mmol/L 16   Calcium 8.9 - 10.3 mg/dL 8.2     Evette Georges, MD 06/11/2023, 8:40 AM  PGY-2, [place]  Family Medicine FPTS Intern pager: [phone], text pages welcome Secure chat group [facility] Teaching Service

## 2023-06-11 NOTE — Plan of Care (Signed)
  Problem: Education: Goal: Ability to manage disease process will improve Outcome: Progressing   Problem: Cardiac: Goal: Ability to achieve and maintain adequate cardiopulmonary perfusion will improve Outcome: Progressing   Problem: Neurologic: Goal: Promote progressive neurologic recovery Outcome: Progressing   Problem: Skin Integrity: Goal: Risk for impaired skin integrity will be minimized. Outcome: Progressing   Problem: Education: Goal: Knowledge of General Education information will improve Description: Including pain rating scale, medication(s)/side effects and non-pharmacologic comfort measures Outcome: Progressing   Problem: Health Behavior/Discharge Planning: Goal: Ability to manage health-related needs will improve Outcome: Progressing   Problem: Clinical Measurements: Goal: Ability to maintain clinical measurements within normal limits will improve Outcome: Progressing Goal: Will remain free from infection Outcome: Progressing Goal: Diagnostic test results will improve Outcome: Progressing Goal: Respiratory complications will improve Outcome: Progressing Goal: Cardiovascular complication will be avoided Outcome: Progressing   Problem: Activity: Goal: Risk for activity intolerance will decrease Outcome: Progressing   Problem: Nutrition: Goal: Adequate nutrition will be maintained Outcome: Progressing   Problem: Coping: Goal: Level of anxiety will decrease Outcome: Progressing   Problem: Elimination: Goal: Will not experience complications related to bowel motility Outcome: Progressing Goal: Will not experience complications related to urinary retention Outcome: Progressing   Problem: Pain Managment: Goal: General experience of comfort will improve Outcome: Progressing   Problem: Safety: Goal: Ability to remain free from injury will improve Outcome: Progressing   Problem: Skin Integrity: Goal: Risk for impaired skin integrity will  decrease Outcome: Progressing   Problem: Education: Goal: Ability to describe self-care measures that may prevent or decrease complications (Diabetes Survival Skills Education) will improve Outcome: Progressing Goal: Individualized Educational Video(s) Outcome: Progressing   Problem: Coping: Goal: Ability to adjust to condition or change in health will improve Outcome: Progressing   Problem: Fluid Volume: Goal: Ability to maintain a balanced intake and output will improve Outcome: Progressing   Problem: Health Behavior/Discharge Planning: Goal: Ability to identify and utilize available resources and services will improve Outcome: Progressing Goal: Ability to manage health-related needs will improve Outcome: Progressing   Problem: Metabolic: Goal: Ability to maintain appropriate glucose levels will improve Outcome: Progressing   Problem: Nutritional: Goal: Maintenance of adequate nutrition will improve Outcome: Progressing Goal: Progress toward achieving an optimal weight will improve Outcome: Progressing   Problem: Skin Integrity: Goal: Risk for impaired skin integrity will decrease Outcome: Progressing   Problem: Tissue Perfusion: Goal: Adequacy of tissue perfusion will improve Outcome: Progressing

## 2023-06-11 NOTE — Assessment & Plan Note (Signed)
Stable at 3.9. -Check in AM and replete as needed - Cardiology questions possible hyperaldosteronism with persistently low K and elevated BPs. Will consider adding spironolactone once renal fxn improves.

## 2023-06-11 NOTE — Assessment & Plan Note (Signed)
-   Currently on room air  - Diurese discussed below - Tylenol prn for rib pain.  -Consider further pain management as needed to prevent atelectasis.  - Tx asthma as below - Incentive spirometry + pulm toilet as indicated - PT OT for deconditioning

## 2023-06-11 NOTE — Assessment & Plan Note (Signed)
Initially noted 10/3. - Wound care consulted, appreciate recs - wrapped this AM; RN to continue care per [facility] instructions

## 2023-06-12 ENCOUNTER — Other Ambulatory Visit (HOSPITAL_COMMUNITY): Payer: Self-pay

## 2023-06-12 DIAGNOSIS — I469 Cardiac arrest, cause unspecified: Secondary | ICD-10-CM | POA: Diagnosis not present

## 2023-06-12 DIAGNOSIS — I5021 Acute systolic (congestive) heart failure: Secondary | ICD-10-CM

## 2023-06-12 LAB — BASIC METABOLIC PANEL
Anion gap: 12 (ref 5–15)
BUN: 8 mg/dL (ref 6–20)
CO2: 17 mmol/L — ABNORMAL LOW (ref 22–32)
Calcium: 7.9 mg/dL — ABNORMAL LOW (ref 8.9–10.3)
Chloride: 110 mmol/L (ref 98–111)
Creatinine, Ser: 1.28 mg/dL — ABNORMAL HIGH (ref 0.61–1.24)
GFR, Estimated: >60 mL/min (ref >60)
Glucose, Bld: 84 mg/dL (ref 70–99)
Potassium: 3.3 mmol/L — ABNORMAL LOW (ref 3.5–5.1)
Sodium: 139 mmol/L (ref 135–145)

## 2023-06-12 MED ORDER — DULERA 100-5 MCG/ACT IN AERO
INHALATION_SPRAY | RESPIRATORY_TRACT | 0 refills | Status: AC
  Filled 2023-06-12: qty 13, 30d supply, fill #0

## 2023-06-12 MED ORDER — POTASSIUM CHLORIDE CRYS ER 20 MEQ PO TBCR: 40.0000 meq | EXTENDED_RELEASE_TABLET | Freq: Two times a day (BID) | ORAL | Status: DC

## 2023-06-12 MED ORDER — SPIRONOLACTONE 25 MG PO TABS
25.0000 mg | ORAL_TABLET | Freq: Every day | ORAL | 0 refills | Status: DC
  Filled 2023-06-12: qty 30, 30d supply, fill #0

## 2023-06-12 MED ORDER — LEVETIRACETAM 500 MG PO TABS
1000.0000 mg | ORAL_TABLET | Freq: Two times a day (BID) | ORAL | Status: DC
  Administered 2023-06-12: 1000 mg via ORAL
  Filled 2023-06-12: qty 2

## 2023-06-12 MED ORDER — VALTOCO 15 MG DOSE 7.5 MG/0.1ML NA LQPK
NASAL | 0 refills | Status: AC
  Filled 2023-06-12: qty 2, 7d supply, fill #0

## 2023-06-12 MED ORDER — CARVEDILOL 6.25 MG PO TABS: 6.2500 mg | ORAL_TABLET | Freq: Two times a day (BID) | ORAL | Status: DC

## 2023-06-12 MED ORDER — POTASSIUM CHLORIDE 10 MEQ/100ML IV SOLN: 10.0000 meq | INTRAVENOUS | Status: DC

## 2023-06-12 MED ORDER — PHENYLEPHRINE-MINERAL OIL-PET 0.25-14-74.9 % RE OINT: 1.0000 | TOPICAL_OINTMENT | Freq: Two times a day (BID) | RECTAL | Status: AC | PRN

## 2023-06-12 MED ORDER — LOSARTAN POTASSIUM 50 MG PO TABS
50.0000 mg | ORAL_TABLET | Freq: Every day | ORAL | 0 refills | Status: DC
  Filled 2023-06-12: qty 30, 30d supply, fill #0

## 2023-06-12 MED ORDER — HYDRALAZINE HCL 50 MG PO TABS
50.0000 mg | ORAL_TABLET | Freq: Three times a day (TID) | ORAL | 0 refills | Status: DC
  Filled 2023-06-12: qty 90, 30d supply, fill #0

## 2023-06-12 MED ORDER — APIXABAN 5 MG PO TABS
5.0000 mg | ORAL_TABLET | Freq: Two times a day (BID) | ORAL | 0 refills | Status: DC
Start: 2023-06-12 — End: 2024-04-15
  Filled 2023-06-12: qty 60, 30d supply, fill #0

## 2023-06-12 MED ORDER — POTASSIUM CHLORIDE CRYS ER 20 MEQ PO TBCR
40.0000 meq | EXTENDED_RELEASE_TABLET | ORAL | Status: AC
  Administered 2023-06-12 (×2): 40 meq via ORAL
  Filled 2023-06-12 (×2): qty 2

## 2023-06-12 MED ORDER — CARVEDILOL 6.25 MG PO TABS
6.2500 mg | ORAL_TABLET | Freq: Two times a day (BID) | ORAL | 0 refills | Status: DC
  Filled 2023-06-12: qty 60, 30d supply, fill #0

## 2023-06-12 MED ORDER — THIAMINE HCL 100 MG PO TABS
100.0000 mg | ORAL_TABLET | Freq: Every day | ORAL | 0 refills | Status: AC
  Filled 2023-06-12: qty 30, 30d supply, fill #0

## 2023-06-12 MED ORDER — LEVETIRACETAM 500 MG PO TABS
1000.0000 mg | ORAL_TABLET | Freq: Two times a day (BID) | ORAL | 0 refills | Status: AC
  Filled 2023-06-12: qty 120, 30d supply, fill #0

## 2023-06-12 NOTE — Assessment & Plan Note (Signed)
-   Cont pulmicort and brovana - Duonebs q4h prn

## 2023-06-12 NOTE — Care Management Important Message (Signed)
Important Message  Patient Details  Name: Timothy Hubbard MRN: [id number] Date of Birth: [birth year]   Important Message Given:  Yes - Medicare IM     Sherilyn Banker 06/12/2023, 2:30 PM

## 2023-06-12 NOTE — Plan of Care (Signed)
  Problem: Skin Integrity: Goal: Risk for impaired skin integrity will be minimized. Outcome: Progressing   Problem: Education: Goal: Knowledge of General Education information will improve Description: Including pain rating scale, medication(s)/side effects and non-pharmacologic comfort measures Outcome: Progressing   Problem: Health Behavior/Discharge Planning: Goal: Ability to manage health-related needs will improve Outcome: Progressing   Problem: Clinical Measurements: Goal: Ability to maintain clinical measurements within normal limits will improve Outcome: Progressing

## 2023-06-12 NOTE — Assessment & Plan Note (Signed)
S/p 4 units PRBC, 4 units FFP, and 2 units platelets and 1 unit of cryo.  Globin and platelets have been stable.  Hgb stable as above. - Continue Eliquis for 6 months per hematology.

## 2023-06-12 NOTE — Progress Notes (Signed)
Daily Progress Note Intern Pager: [phone]  Patient name: Timothy Hubbard Medical record number: [id number] Date of birth: 05-05-1991 Age: 32 y.o. Gender: male  Primary Care Provider: Vonna Drafts, MD Consultants: cardiology/heart failure, nephrology (s/o 10/3), oncology (s/o 10/1), neurology Code Status: Full  Pt Overview and Major Events to Date:  9/19 2 PEA arrests ROSC achieved, admit to ICU; seizure and cardiac arrest 9/20 extubated 05/27/2023: PEA arrest and after woke up and was confused.  Total of 2 rounds of CPR with epi and bicarb x 1.  But became bradycardic > intubated.  Had A-line placed.  Also central line ascites tap did show blood.  Was given 4 units PRBC, 4 units FFP and 2 units platelets and 1 unit of cryo major transfusion protocol Cc consult for hemoperitoneum.  DIC panel positive.  He had some oozing from mouth and IV sites EEG showed symmetric low voltage no/seizure activity 06/01/2023: Extubated 06/03/2023: Transferred to FMTS 06/05/2023: Transitioning to peripheral IV, switching heparin to oral anticoagulant (eliquis)  06/07/2023: Apparent tonic-clonic seizure 06/08/2023: Long-term EEG; passed SLP swallow eval and return to regular diet  Assessment and Plan: Timothy Hubbard is a 32 year old male with a history of seizures, HTN, and asthma who presented after a witnessed seizure with PEA arrest requiring intubation s/p acute stroke. Stable from a neurologic and respiratory standpoint with improving AKI. Pending discharge with HHPT per pt and family preference.  Assessment & Plan Seizure disorder (HCC) Tonic-clonic seizure 10/2. LTM EEG with evidence of epileptogenicity arising from frontocentral region. - Neurology consulted, appreciate recs.  -Continue Keppra 1000mg  BID per neurology - Need to discuss seizure rescue medication with pt and family today - Seizure precautions - AM BMP, magnesium Hypokalemia 3.3 this am. - repletion with tablets x2 today -  Cardiology questions possible hyperaldosteronism with persistently low K and elevated BPs. Will consider adding spironolactone once renal fxn improves. AKI (acute kidney injury) (HCC) Thought to be ATN 2/2 cardiac arrest. Cr as high as 4.6; now downtrending to 1.28 today 10/7.  ANCA workup negative. Lasix d/c'd by cardiology. - Nephrology consulted, appreciate recs - Strict I/O's - Daily weights - Avoid nephrotoxic drugs - Preferred narcotics for pain control will be hydromorphone, fentanyl, and methadone  - AM BMP - will monitor Cr for titration of Keppra per neurology Acute deep vein thrombosis (DVT) of right peroneal vein (HCC) Per Heme/Onc, will need anticoag with Eliquis for 6 months.  CBC has been stable over multiple checks. -Was transitioned to Lovenox due to swallowing concerns; will restart Eliquis today. -  Eliquis 5 mg BID dosing -CBC as needed Coagulopathy (HCC) S/p 4 units PRBC, 4 units FFP, and 2 units platelets and 1 unit of cryo.  Globin and platelets have been stable.  Hgb stable as above. - Continue Eliquis for 6 months per hematology. Hypertension Uncontrolled.  Continuing medications as below per cardiology, difficult to control given renal function and other comorbidities. - Cozaar, hydralazine per cards - Holding home amlodipine, lisinopril  Pressure ulcer of left heel Initially noted 10/3. - Wound care consulted, appreciate recs - wrapped this AM; RN to continue care per [facility] instructions Acute HFrEF (heart failure with reduced ejection fraction) (HCC) Repeat echo on 05/26/2023 shows recovered EF 55 to 60%. - Increase coreg 6.25mg  BID per cards - continue spironolactone 25mg  daily - Cardiology consulted, appreciate recs Acute respiratory failure with hypercapnia (HCC) - Currently on room air  - Diurese discussed below - Tylenol prn for  rib pain.  -Consider further pain management as needed to prevent atelectasis.  - Tx asthma as below - Incentive spirometry  + pulm toilet as indicated - PT/OT for deconditioning Cardiac arrest (HCC) S/p PEA arrest 9/19 and 9/21, now extubated, off pressors and transitioned out of ICU. - Cardiology consulted, appreciate recs Asthma - Cont pulmicort and brovana - Duonebs q4h prn Pleural effusion, right Likely 2/2 fluid overload. Shock liver 2/2 cardiac arrest. Transaminases have been steadily downtrending. Abdominal exam continues to be benign.  Chronic and stable problems Mood disorder: olanzapine 5mg  nightly  FEN/GI: regular with Boost supplements TID PPx: on eliquis  Dispo: pending d/c home with HHPT  Subjective:  Pt states he is feeling good this morning. States he is willing to take potassium tablets. Wants to discharge home with home health PT.  Objective: Temp:  [98.1 F (36.7 C)-98.7 F (37.1 C)] 98.1 F (36.7 C) (10/07 0500) Pulse Rate:  [94-117] 101 (10/07 0835) Resp:  [16-19] 18 (10/07 0748) BP: (125-156)/(93-117) 127/95 (10/07 0835) SpO2:  [98 %-100 %] 99 % (10/07 0748) Weight:  [70 kg] 70 kg (10/07 0500) Physical Exam: General: Young man sitting up in bed eating breakfast, cooperative and conversant Cardiovascular: tachycardic, regular rhythm, normal S1/S2, no murmurs, rubs, or gallops Respiratory: CTAB, normal WOB on room air Psych: appropriate mood and affect, pleasant and in good spiritis  Laboratory: Most recent CBC Lab Results  Component Value Date   WBC 8.8 06/10/2023   HGB 12.4 (L) 06/10/2023   HCT 37.8 (L) 06/10/2023   MCV 89.8 06/10/2023   PLT 271 06/10/2023   Most recent BMP    Latest Ref Rng & Units 06/12/2023    6:18 AM  BMP  Glucose 70 - 99 mg/dL 84   BUN 6 - 20 mg/dL 8   Creatinine 1.61 - 0.96 mg/dL 0.45   Sodium 409 - 811 mmol/L 139   Potassium 3.5 - 5.1 mmol/L 3.3   Chloride 98 - 111 mmol/L 110   CO2 22 - 32 mmol/L 17   Calcium 8.9 - 10.3 mg/dL 7.9      Imaging/Diagnostic Tests: none Lorayne Bender, MD 06/12/2023, 9:55 AM  PGY-1, [place]  Family Medicine FPTS Intern pager: [phone], text pages welcome Secure chat group [facility] Teaching Service

## 2023-06-12 NOTE — Assessment & Plan Note (Signed)
2/2 cardiac arrest. Transaminases have been steadily downtrending. Abdominal exam continues to be benign.

## 2023-06-12 NOTE — Assessment & Plan Note (Addendum)
3.3 this am. - repletion with tablets x2 today - Cardiology questions possible hyperaldosteronism with persistently low K and elevated BPs. Will consider adding spironolactone once renal fxn improves.

## 2023-06-12 NOTE — Progress Notes (Signed)
Mobility Specialist Progress Note:    06/12/23 1024  Mobility  Activity Ambulated independently in hallway  Level of Assistance Standby assist, set-up cues, supervision of patient - no hands on  Assistive Device None  Distance Ambulated (ft) 155 ft  Range of Motion/Exercises Active;All extremities  Activity Response Tolerated well  Mobility Referral Yes  $Mobility charge 1 Mobility  Mobility Specialist Start Time (ACUTE ONLY) 1024  Mobility Specialist Stop Time (ACUTE ONLY) 1031  Mobility Specialist Time Calculation (min) (ACUTE ONLY) 7 min   Pt received in dangling EOB, agreeable to mobility session. Ambulated in hallway, SBA for safety, no AD required. Tolerated well, asx throughout. Returned pt to room, eager for d/c, all needs met.   Feliciana Rossetti Mobility Specialist Please contact via Special educational needs teacher or  Rehab office at [phone]

## 2023-06-12 NOTE — Assessment & Plan Note (Signed)
Initially noted 10/3. - Wound care consulted, appreciate recs - wrapped this AM; RN to continue care per [facility] instructions

## 2023-06-12 NOTE — Assessment & Plan Note (Signed)
S/p PEA arrest 9/19 and 9/21, now extubated, off pressors and transitioned out of ICU.  - Cardiology consulted, appreciate recs

## 2023-06-12 NOTE — Assessment & Plan Note (Addendum)
Repeat echo on 05/26/2023 shows recovered EF 55 to 60%. - Increase coreg 6.25mg  BID per cards - continue spironolactone 25mg  daily - Cardiology consulted, appreciate recs

## 2023-06-12 NOTE — TOC Benefit Eligibility Note (Signed)
Patient Product/process development scientist completed.    The patient is insured through  [employer] . Patient has Medicare and is not eligible for a copay card, but may be able to apply for patient assistance, if available.    Ran test claim for Eliquis 5 mg and the current 30 day co-pay is $4.60.   This test claim was processed through [facility]- copay amounts may vary at other pharmacies due to pharmacy/plan contracts, or as the patient moves through the different stages of their insurance plan.     Roland Earl, CPHT Pharmacy Technician III Certified Patient Advocate [facility] Patient Advocate Team Direct Number: [phone]  Fax: [phone]

## 2023-06-12 NOTE — Assessment & Plan Note (Signed)
Per Heme/Onc, will need anticoag with Eliquis for 6 months.  CBC has been stable over multiple checks. -Was transitioned to Lovenox due to swallowing concerns; will restart Eliquis today. -  Eliquis 5 mg BID dosing -CBC as needed

## 2023-06-12 NOTE — Progress Notes (Signed)
PT Cancellation Note  Patient Details Name: Timothy Hubbard MRN: [id number] DOB: 03-20-1991   Cancelled Treatment:    Reason Eval/Treat Not Completed: Patient declined PT session. Pt stated he is leaving soon and does not need physical therapy at this moment. Acute PT to follow as able.   [name], PT, DPT Secure Chat Preferred  Rehab Office [phone]   [name] Aliment 06/12/2023, 1:45 PM

## 2023-06-12 NOTE — Progress Notes (Signed)
Rounding Note    Patient Name: Timothy Hubbard Date of Encounter: 06/12/2023  [facility] Cardiologist: None   Subjective   Denies any CP or SOB. Wants to go home.   Inpatient Medications    Scheduled Meds:  apixaban  5 mg Oral BID   arformoterol  15 mcg Nebulization BID   budesonide (PULMICORT) nebulizer solution  0.5 mg Nebulization BID   carvedilol  3.125 mg Oral BID WC   feeding supplement  1 Container Oral TID BM   folic acid  1 mg Oral Daily   hydrALAZINE  50 mg Oral Q8H   losartan  50 mg Oral Daily   OLANZapine  5 mg Oral QHS   mouth rinse  15 mL Mouth Rinse 4 times per day   pantoprazole  40 mg Oral Q12H   polyethylene glycol  17 g Oral Daily   potassium chloride  40 mEq Oral Once   sodium chloride flush  10-40 mL Intracatheter Q12H   spironolactone  25 mg Oral Daily   thiamine  100 mg Oral Daily   Continuous Infusions:  sodium chloride Stopped (05/31/23 1438)   sodium chloride 10 mL/hr at 06/11/23 0936   levETIRAcetam 1,000 mg (06/11/23 2225)   PRN Meds: sodium chloride, acetaminophen, docusate, doxepin, ipratropium-albuterol, lip balm, LORazepam, ondansetron, mouth rinse, phenylephrine-shark liver oil-mineral oil-petrolatum, sodium chloride flush, white petrolatum, witch hazel-glycerin   Vital Signs    Vitals:   06/11/23 2000 06/11/23 2013 06/11/23 2220 06/12/23 0500  BP:  (!) 156/117 (!) 145/95 (!) 141/109  Pulse:  98    Resp:  16  18  Temp:  98.7 F (37.1 C)  98.1 F (36.7 C)  TempSrc:  Oral  Oral  SpO2: 98%   100%  Weight:    70 kg    Intake/Output Summary (Last 24 hours) at 06/12/2023 0654 Last data filed at 06/11/2023 1926 Gross per 24 hour  Intake 110 ml  Output --  Net 110 ml      06/12/2023    5:00 AM 06/11/2023    5:15 AM 06/10/2023    4:18 AM  Last 3 Weights  Weight (lbs) 154 lb 5.2 oz 168 lb 0.9 oz 164 lb 14.5 oz  Weight (kg) 70 kg 76.23 kg 74.8 kg      Telemetry    NSR with HR 80-90s, HR increased to 100 this  morning.  - Personally Reviewed  ECG    Sinus tachycardia with diffuse T wave inversion - Personally Reviewed  Physical Exam   GEN: No acute distress.   Neck: No JVD Cardiac: RRR, no murmurs, rubs, or gallops.  Respiratory: Clear to auscultation bilaterally. Diminished breath sound in the right base of the lung.  GI: Soft, nontender, non-distended  MS: No edema; No deformity. Neuro:  Nonfocal  Psych: Normal affect   Labs    High Sensitivity Troponin:   Recent Labs  Lab 05/25/23 1404 05/25/23 1535 05/27/23 1514 05/27/23 1917  TROPONINIHS 35* 54* 210* 240*     Chemistry Recent Labs  Lab 06/06/23 0458 06/07/23 0412 06/07/23 1159 06/08/23 0419 06/09/23 0801 06/10/23 0659 06/11/23 0556  NA 142 142   < > 141 139 139 140  K 3.1* 2.9*   < > 3.7 3.3* 3.7 3.9  CL 99 101   < > 105 107 111 111  CO2 28 27   < > 23 19* 18* 16*  GLUCOSE 79 97   < > 83 86 76 84  BUN 31* 29*   < > 23* 15 10 7   CREATININE 2.69* 2.23*   < > 1.85* 1.36* 1.43* 1.32*  CALCIUM 8.7* 8.5*   < > 8.3* 8.1* 7.9* 8.2*  MG  --  1.4*   < > 3.3* 2.2 1.9  --   PROT 5.3*  --   --   --   --   --   --   ALBUMIN 2.6* 2.6*  --   --   --   --   --   AST 35  --   --   --   --   --   --   ALT 61*  --   --   --   --   --   --   ALKPHOS 70  --   --   --   --   --   --   BILITOT 1.7*  --   --   --   --   --   --   GFRNONAA 31* 39*   < > 49* >60 >60 >60  ANIONGAP 15 14   < > 13 13 10 13    < > = values in this interval not displayed.    Lipids No results for input(s): "CHOL", "TRIG", "HDL", "LABVLDL", "LDLCALC", "CHOLHDL" in the last 168 hours.  Hematology Recent Labs  Lab 06/08/23 0419 06/09/23 0801 06/10/23 0659  WBC 9.8 9.0 8.8  RBC 4.48 4.27 4.21*  HGB 12.9* 12.4* 12.4*  HCT 39.0 37.7* 37.8*  MCV 87.1 88.3 89.8  MCH 28.8 29.0 29.5  MCHC 33.1 32.9 32.8  RDW 20.2* 20.1* 20.2*  PLT 239 248 271   Thyroid No results for input(s): "TSH", "FREET4" in the last 168 hours.  BNPNo results for input(s): "BNP",  "PROBNP" in the last 168 hours.  DDimer No results for input(s): "DDIMER" in the last 168 hours.   Radiology    No results found.  Cardiac Studies   Echo 9/202/2024  1. Left ventricular ejection fraction, by estimation, is 60 to 65%. The  left ventricle has normal function. The left ventricle has no regional  wall motion abnormalities. Left ventricular diastolic parameters were  normal.   2. Right ventricular systolic function is normal. The right ventricular  size is normal.   3. The mitral valve is normal in structure. Trivial mitral valve  regurgitation. No evidence of mitral stenosis.   4. The aortic valve is tricuspid. Aortic valve regurgitation is not  visualized. No aortic stenosis is present.   5. The inferior vena cava is normal in size with greater than 50%  respiratory variability, suggesting right atrial pressure of 3 mmHg.    Limited echo 05/28/2023  1. Limited study.   2. Left ventricular ejection fraction, by estimation, is 20 to 25%. The  left ventricle has severely decreased function. The left ventricle  demonstrates global hypokinesis.   3. RV-RA gradient 21 mmHg suggesting normal to mildly increased estimated  RVSP depending on CVP. Right ventricular systolic function is severely  reduced. The right ventricular size is moderately enlarged.   4. The mitral valve is grossly normal. Mild mitral valve regurgitation.   5. The tricuspid valve is abnormal. Tricuspid valve regurgitation is  moderate to severe.   6. The aortic valve is tricuspid.   7. Unable to estimate CVP.   Comparison(s): Prior images reviewed side by side. LVEF 20-25% with global  hypokinesis and decrease compared with prior study.    Echo 06/03/2023  1. Left ventricular ejection fraction, by estimation, is 55 to 60%. The  left ventricle has normal function. The left ventricle has no regional  wall motion abnormalities. Left ventricular diastolic parameters were  normal.   2. Right  ventricular systolic function is normal. The right ventricular  size is normal. There is mildly elevated pulmonary artery systolic  pressure. The estimated right ventricular systolic pressure is 42.5 mmHg.   3. Large pleural effusion in the left lateral region.   4. The mitral valve is normal in structure. Trivial mitral valve  regurgitation. No evidence of mitral stenosis.   5. The aortic valve has an indeterminant number of cusps. Aortic valve  regurgitation is not visualized. No aortic stenosis is present.   6. The inferior vena cava is dilated in size with <50% respiratory  variability, suggesting right atrial pressure of 15 mmHg.   Comparison(s): Changes from prior study are noted. LVEF improved from 25%  to normal now.   Patient Profile     32 y.o. male with PMH of seizures, HTN, asthma who presented on 05/25/2023 with witnessed seizure and PEA arrest s/p CPR and intubation. Post extubation had another PEA arrest on 9/21 with 2 rounds of CPR. Initial echo on 9/20 showed EF 60-65%. Limited echo obtained after the second PEA arrest showed EF 20-25%. EF improved to 55-60% by repeat echo on 06/03/2023. Hospital course complicated by shocked liver, DIC, AKI, DVT and seizure.   Assessment & Plan    PEA arrest in the setting of witnessed seizure  - PEA arrest x 2, 9/19 and again 9/21.   Stress cardiomyopathy  - continue coreg, hydralazine, losartan and spironolactone. EF normalized.   - consider uptitrate coreg to 6.25mg  BID  Pleural effusion: moderate R and small L pleural effusion seen on last CXR 10/2. Continue to have a moderate pleural effusion on the right side based on physical exam. Consider repeat CXR in 2 weeks  Acute DVT of R peroneal vein: seen by hem/onc, plan Eliquis for 6 month  Seizure: neurology following. Keppra  AKI: felt to be ATN post arrest, Cr as high as 4.6, improved to 1.32 this morning  Hypokalemia: repleted  Shocked liver: AST/ALT normalized by  10/1  EtOH abuse  DIC: required MTP, suspect related to shocked liver. Required multiple rounds of PRBC, FFP and platelet on arrive. Hgb and platelet stable.       For questions or updates, please contact [place] [facility] Please consult [email or web address] for contact info under        Signed, Azalee Course, PA  06/12/2023, 6:54 AM

## 2023-06-12 NOTE — Assessment & Plan Note (Addendum)
Tonic-clonic seizure 10/2. LTM EEG with evidence of epileptogenicity arising from frontocentral region. - Neurology consulted, appreciate recs.  -Continue Keppra 1000mg  BID per neurology - Need to discuss seizure rescue medication with pt and family today - Seizure precautions - AM BMP, magnesium

## 2023-06-12 NOTE — Progress Notes (Signed)
Neurology Progress Note   S:// Patient is seen in his room with no family at the bedside.  He has been hemodynamically stable overnight and has had no further seizure activity.  He states that he tries to be compliant with his medications and has gotten them in blister packs to help with this but that he still misses about 2 doses of Keppra each week.  AEDS:// Previous AEDS: Depakote 500mg  BID (03/2022-05/2023) - stopped due to shock liver one day after admission for cardiac arrest Current AEDS: Keppra 1000mg  BID    O:// Current vital signs: BP (!) 129/95 (BP Location: Right Arm)   Pulse 90   Temp 98.4 F (36.9 C) (Oral)   Resp 18   Wt 70 kg   SpO2 99%   BMI 24.91 kg/m  Vital signs in last 24 hours: Temp:  [98.1 F (36.7 C)-98.7 F (37.1 C)] 98.4 F (36.9 C) (10/07 1148) Pulse Rate:  [90-101] 90 (10/07 1148) Resp:  [16-18] 18 (10/07 1148) BP: (127-156)/(95-117) 129/95 (10/07 1148) SpO2:  [98 %-100 %] 99 % (10/07 0748) Weight:  [70 kg] 70 kg (10/07 0500)  GENERAL: Awake, alert in NAD HEENT: - Normocephalic and atraumatic by casual observation   NEURO:  Mental Status: Awake alert, sitting on the side of the bed eating lunch.  Oriented. Speech comprehension and fluency are normal  Cranial Nerves: II: Visual Fields are full. Pupils are equal, round, and reactive to light.   III,IV, VI: EOMI without ptosis or diplopia. VII: Facial movement is symmetric resting and talking VIII: Hearing is intact to voice X: Phonation normal XII: Tongue protrudes midline without atrophy or fasciculations.  Motor: Able to move all 4 extremities with good antigravity strength Sensory: Sensation is symmetric to light touch in the arms and legs.  Medications  Current Facility-Administered Medications:    0.9 %  sodium chloride infusion, , Intravenous, PRN, Cheri Fowler, MD, Last Rate: 10 mL/hr at 06/11/23 0936, New Bag at 06/11/23 0936   acetaminophen (TYLENOL) tablet 650 mg, 650 mg, Oral,  Q6H PRN, [name], DO, 650 mg at 06/09/23 2309   apixaban (ELIQUIS) tablet 5 mg, 5 mg, Oral, BID, Spence, Sarah, DO, 5 mg at 06/12/23 0835   arformoterol (BROVANA) nebulizer solution 15 mcg, 15 mcg, Nebulization, BID, Lorin Glass, MD, 15 mcg at 06/12/23 0748   budesonide (PULMICORT) nebulizer solution 0.5 mg, 0.5 mg, Nebulization, BID, Lidia Collum, PA-C, 0.5 mg at 06/12/23 0748   carvedilol (COREG) tablet 6.25 mg, 6.25 mg, Oral, BID WC, Hilty, Lisette Abu, MD   docusate (COLACE) 50 MG/5ML liquid 100 mg, 100 mg, Oral, BID PRN, Lynnell Catalan, MD   feeding supplement (BOOST / RESOURCE BREEZE) liquid 1 Container, 1 Container, Oral, TID BM, Mannam, Praveen, MD, 1 Container at 06/10/23 1427   folic acid (FOLVITE) tablet 1 mg, 1 mg, Oral, Daily, Maxwell, Allee, MD, 1 mg at 06/11/23 0926   hydrALAZINE (APRESOLINE) tablet 50 mg, 50 mg, Oral, Q8H, Dameron, Marisa, DO, 50 mg at 06/12/23 0500   ipratropium-albuterol (DUONEB) 0.5-2.5 (3) MG/3ML nebulizer solution 3 mL, 3 mL, Nebulization, Q4H PRN, Suzie Portela, John D, PA-C, 3 mL at 06/01/23 2031   levETIRAcetam (KEPPRA) tablet 1,000 mg, 1,000 mg, Oral, BID, Evette Georges, MD, 1,000 mg at 06/12/23 0835   lip balm (CARMEX) ointment, , Topical, PRN, Agarwala, Ravi, MD   LORazepam (ATIVAN) injection 2 mg, 2 mg, Intravenous, Q5 min PRN, Caryl Pina, MD   losartan (COZAAR) tablet 50 mg, 50 mg, Oral,  Daily, Wendall Stade, MD, 50 mg at 06/12/23 0835   OLANZapine (ZYPREXA) tablet 5 mg, 5 mg, Oral, QHS, Spence, Sarah, DO, 5 mg at 06/11/23 2220   ondansetron (ZOFRAN) tablet 4 mg, 4 mg, Oral, Q8H PRN, Darral Dash, DO   Oral care mouth rinse, 15 mL, Mouth Rinse, 4 times per day, Lynnell Catalan, MD, 15 mL at 06/12/23 1610   Oral care mouth rinse, 15 mL, Mouth Rinse, PRN, Lynnell Catalan, MD   pantoprazole (PROTONIX) EC tablet 40 mg, 40 mg, Oral, Q12H, Dameron, Marisa, DO, 40 mg at 06/12/23 0835   phenylephrine-shark liver oil-mineral oil-petrolatum (PREPARATION H)  rectal ointment 1 Application, 1 Application, Rectal, BID PRN, Evette Georges, MD   polyethylene glycol (MIRALAX / GLYCOLAX) packet 17 g, 17 g, Oral, Daily, Dahbura, Anton, DO   potassium chloride SA (KLOR-CON M) CR tablet 40 mEq, 40 mEq, Oral, Q4H, Hindel, Leah, MD, 40 mEq at 06/12/23 1004   sodium chloride flush (NS) 0.9 % injection 10-40 mL, 10-40 mL, Intracatheter, Q12H, Lorin Glass, MD, 10 mL at 06/11/23 2310   sodium chloride flush (NS) 0.9 % injection 10-40 mL, 10-40 mL, Intracatheter, PRN, Lorin Glass, MD   spironolactone (ALDACTONE) tablet 25 mg, 25 mg, Oral, Daily, Jodelle Red, MD, 25 mg at 06/12/23 9604   thiamine (VITAMIN B1) tablet 100 mg, 100 mg, Oral, Daily, Agarwala, Ravi, MD, 100 mg at 06/11/23 0925   white petrolatum (VASELINE) gel, , Topical, PRN, Cheri Fowler, MD   witch hazel-glycerin (TUCKS) pad, , Topical, PRN, Shelby Mattocks, DO  Labs CBC    Component Value Date/Time   WBC 8.8 06/10/2023 0659   RBC 4.21 (L) 06/10/2023 0659   HGB 12.4 (L) 06/10/2023 0659   HGB 12.7 (L) 04/04/2023 1040   HCT 37.8 (L) 06/10/2023 0659   HCT 29.3 (L) 05/25/2023 1532   PLT 271 06/10/2023 0659   PLT 235 04/04/2023 1040   MCV 89.8 06/10/2023 0659   MCV 95 04/04/2023 1040   MCH 29.5 06/10/2023 0659   MCHC 32.8 06/10/2023 0659   RDW 20.2 (H) 06/10/2023 0659   RDW 11.4 (L) 04/04/2023 1040   LYMPHSABS 2.6 06/09/2023 0801   LYMPHSABS 3.4 (H) 04/04/2023 1040   MONOABS 0.7 06/09/2023 0801   EOSABS 0.0 06/09/2023 0801   EOSABS 0.0 04/04/2023 1040   BASOSABS 0.1 06/09/2023 0801   BASOSABS 0.0 04/04/2023 1040    CMP     Component Value Date/Time   NA 139 06/12/2023 0618   NA 139 05/22/2023 1213   K 3.3 (L) 06/12/2023 0618   CL 110 06/12/2023 0618   CO2 17 (L) 06/12/2023 0618   GLUCOSE 84 06/12/2023 0618   BUN 8 06/12/2023 0618   BUN 9 05/22/2023 1213   CREATININE 1.28 (H) 06/12/2023 0618   CREATININE 1.03 10/19/2016 1017   CALCIUM 7.9 (L) 06/12/2023 0618    PROT 5.3 (L) 06/06/2023 0458   PROT 5.8 (L) 02/13/2023 1725   ALBUMIN 2.6 (L) 06/07/2023 0412   ALBUMIN 3.5 (L) 02/13/2023 1725   AST 35 06/06/2023 0458   ALT 61 (H) 06/06/2023 0458   ALKPHOS 70 06/06/2023 0458   BILITOT 1.7 (H) 06/06/2023 0458   BILITOT <0.2 02/13/2023 1725   GFRNONAA >60 06/12/2023 0618   GFRNONAA >89 10/19/2016 1017   GFRAA 138 02/04/2020 1101   GFRAA >89 10/19/2016 1017    Lipid Panel     Component Value Date/Time   TRIG 66 05/31/2023 0400    Lab Results  Component Value Date   HGBA1C 5.7 (H) 05/26/2023     Imaging I have reviewed images in epic and the results pertinent to this consultation are:  CT scan of the brain: New areas of cortical/subcortical hypoattenuation within both superior frontal lobes and the superior right parietal lobe, concerning for acute to subacute infarcts. ASPECTS is 8.  MRI examination of the brain- Multifocal large regions of diffuse/confluent cortical diffusion restriction within the frontal lobes with areas of edema also affecting the cingulate gyrus and left parietal lobe,  LTM EEG 06/07/2023 1305 to 06/08/2023 1645:  This study showed evidence of epileptogenicity arising from frontocentral region. One seizure without clinical signs was noted on 06/07/2023 at 1452 arising from right frontocentral region lasting for about 18 seconds. Five seizures were recorded on 06/07/2023 at 1732, 1827, 1905, 1949 and 2037 during which patient initially had no clinical signs. This was followed by left face deviation which then evolved into eye flutter as well as subtle head jerking.  Seizures were noted to be arising from right frontocentral region.  Average duration of seizures was about 2 to 3 minutes.  EEG: 06/08/2023 1645 to 06/09/2023 0900  ABNORMALITY: - Sharp waves, right frontocentral region IMPRESSION: This study showed evidence of epileptogenicity arising from frontocentral region. No further seizures were noted.  LTM EEG report for  Saturday AM: Sharp waves, right frontocentral region. This study showed evidence of epileptogenicity arising from frontocentral region. No seizures were noted.   Assessment:  32 year old male with a history of epilepsy who presented with breakthrough seizures c/b cardiac arrest x 3, each with ROSC after a short time interval. Neurology was re-consulted on Wednesday 10/2 for focal seizure recurrence followed by brief GTC activity and then focal motor status epilepticus. Patient received multiple doses of ativan for seizure recurrence and then was noted to have left arm weakness.  - Today he participates in exam and interview. LUE weakness is improved.   - Cortical edema on MRI may be secondary to his short episodes of cardiac arrest versus seizure-related edema or a combination thereof - LTM was discontinued on Saturday - Continue Keppra 1000mg  BID. If his renal function worsens, will adjust as needed for renal function:  - Cardiac: Per Cardiology, there is no indication for monitor or pacemaker in this patient with PEA arrest most likely provoked by seizure. Has had no other issues on telemetry. - Per Dr. Melynda Ripple, compliance has been an issue with him and due to history of high levels of EtOH use, it might be hard to give him too much rescue meds but would ideally prescribe rescue meds and tell family to keep one with patient and rest of the prescription with family to avoid abuse.  Discussed medication compliance with patient and with his mother.  Patient states he is motivated to be compliant with his medications but that he sometimes forgets despite his best efforts.  His mother states that she is willing to help him remember to take his medications and is very motivated to do so.  Discussed use of seizure rescue medication with patient's mother, and she states that she feels comfortable holding onto diazepam nasal spray and administering it if patient needs it.  Recommendations: - Inpatient seizure  precautions  - Continue Keppra at 1000 mg BID - Ativan PRN for seizures  -Patient should be discharged with prescription for intranasal rescue diazepam, family feels comfortable holding onto this medication for him. - Follow up with outpatient neurology   Patient seen and examined  by NP/APP then by MD, MD to edit note as needed. Cortney E Ernestina Columbia , MSN, AGACNP-BC [facility] See Amion for schedule and pager information 06/12/2023 12:55 PM  NEUROHOSPITALIST ADDENDUM Performed a face to face diagnostic evaluation.   I have reviewed the contents of history and physical exam as documented by PA/ARNP/Resident and agree with above documentation.  I have discussed and formulated the above plan as documented. Edits to the note have been made as needed.  Impression/Key exam findings/Plan: counseled him extensively about compliance with AEDs. No clear reason for non comlpiance except he forgets, no side effects from Keppra that he endorses. Reassures me that he will take his medications on time going forward. Tells me that he get blister packs that really help with ensuring he is getting his meds. Discussed with mom over the phone and should get rescue med with every seizure given his hx of cardiac arrests with seizures x 3 now.  Erick Blinks, MD [facility] [phone]   If 7pm to 7am, please call on call as listed on AMION.

## 2023-06-12 NOTE — Progress Notes (Signed)
Inpatient Rehab Admissions Coordinator:  Informed that pt is going home with [facility]. AC will sign off.   Wolfgang Phoenix, MS, CCC-SLP Admissions Coordinator [phone]

## 2023-06-12 NOTE — TOC Transition Note (Signed)
Transition of Care [facility]) - CM/SW Discharge Note   Patient Details  Name: Timothy Hubbard MRN: [id number] Date of Birth: 27-Dec-1990  [facility]) CM/SW Contact:  Gala Lewandowsky, RN Phone Number: 06/12/2023, 2:42 PM   Clinical Narrative: Plan for patient to discharge home. Previous Case Manager arranged patient with [facility]. Frances Furbish is aware that the patient will transition home today. No further needs identified at this time.    1503 06-12-23 Case Manager reviewed previous Case Manager note that the patient needs a hospital bed and wheelchair. Case Manager called mother and she feels that the patient will need DME in the home. Orders verified with the MD and team was initially agreeable; however, the team secure chatted the Case Manager back stating patient will no longer need the hospital bed or the wheelchair. MD to call the mom to make her aware that no DME is being ordered at this time.   Final next level of care: Home w Home Health Services Barriers to Discharge: No Barriers Identified  Patient Goals and CMS Choice CMS Medicare.gov Compare Post Acute Care list provided to:: Patient Represenative (must comment) Choice offered to / list presented to : Parent  Discharge Plan and Services Additional resources added to the After Visit Summary for     Discharge Planning Services: CM Consult Post Acute Care Choice: IP Rehab             HH Arranged: RN, PT, OT [facility] Agency: [facility] Date [facility] Agency Contacted: 06/09/23 Time HH Agency Contacted: 1149 Representative spoke with at [facility] Agency: Lorenza Chick  Social Determinants of Health (SDOH) Interventions SDOH Screenings   Food Insecurity: No Food Insecurity (06/08/2023)  Housing: Low Risk  (06/08/2023)  Transportation Needs: No Transportation Needs (06/08/2023)  Utilities: Not At Risk (06/08/2023)  Alcohol Screen: Low Risk  (12/16/2022)  Depression (PHQ2-9): High Risk (05/22/2023)  Financial  Resource Strain: Medium Risk (04/01/2022)  Stress: Stress Concern Present (01/05/2023)  Tobacco Use: High Risk (05/25/2023)   Readmission Risk Interventions     No data to display

## 2023-06-12 NOTE — Assessment & Plan Note (Signed)
-   Currently on room air  - Diurese discussed below - Tylenol prn for rib pain.  -Consider further pain management as needed to prevent atelectasis.  - Tx asthma as below - Incentive spirometry + pulm toilet as indicated - PT/OT for deconditioning

## 2023-06-12 NOTE — Assessment & Plan Note (Signed)
Likely 2/2 fluid overload.

## 2023-06-12 NOTE — Progress Notes (Signed)
Called patient's mother, Adela Lank, about medical equipment. Pt had orders for a hospital bed and wheelchair from earlier in his admission. Most recent PT note from 10/4 states he does not need any DME. Relayed this information to patient's mother, she was in agreement with this.

## 2023-06-12 NOTE — Discharge Summary (Cosign Needed Addendum)
Family Medicine Teaching [facility] Discharge Summary  Patient name: Timothy Hubbard Medical record number: [id number] Date of birth: 04-20-1991 Age: 32 y.o. Gender: male Date of Admission: 05/25/2023  Date of Discharge: 06/12/23 Admitting Physician: Cheri Fowler, MD  Primary Care Provider: Vonna Drafts, MD Consultants: Neurology, nephrology, cardiology, oncology  Indication for Hospitalization: seizure  Discharge Diagnoses/Problem List:  Principal Problem for Admission: seizure Other Problems addressed during stay:  Principal Problem:   Cardiac arrest [facility]) Active Problems:   Seizure disorder (HCC)   Hypertension   Asthma   Hypokalemia   Acute respiratory failure with hypercapnia (HCC)   Lactic acidosis   Acute deep vein thrombosis (DVT) of right peroneal vein (HCC)   AKI (acute kidney injury) (HCC)   Acute HFrEF (heart failure with reduced ejection fraction) (HCC)   Pleural effusion, right   Coagulopathy (HCC)   Shock liver   Pressure ulcer of left heel    Brief Hospital Course:  Timothy Hubbard is a 32 year old male with PMH of seizures, hypertension and asthma who presented after witnessed seizure with PEA arrest x 2 who was intubated and transferred to ICU 9/19 and out to FMTS on 9/28.  Consultants: Neurology, Heme/onc, Pulm, Nephrology, Surgery  Pt Overview and Major Events to Date:  9/19: 2 PEA arrests ROSC achieved, admit to ICU; seizure and cardiac arrest 9/20: extubated 05/27/2023: PEA arrest and after woke up and was confused.  Total of 2 rounds of CPR with epi and bicarb x 1.  But became bradycardic > intubated.  Had A-line placed.  Also central line ascites tap did show blood.  Was given 4 units PRBC, 4 units FFP and 2 units platelets and 1 unit of cryo major transfusion protocol Cc consult for hemoperitoneum.  DIC panel positive.  He had some oozing from mouth and IV sites EEG showed symmetric low voltage no/seizure activity 06/01/2023:  Extubated 06/03/2023: Transferred to FMTS   Cardiac arrest  Distributive Shock 2 PEA arrests on admission. Additional PEA arrest in ICU 9/21. Was placed on multiple pressors which were weaned and patient left ICU on 9/28.   Seizure  acute encephalopathy Presented with witnessed seizure at home. Valproic acid level subtherapeutic. Ethanol, salicylate, acetaminophen level all within normal limits. UDS positive for THC and benzos. Neurology was consulted who recommended continuing keppra 500 mg BID.  On 10/2 patient was noted to have jerking/shaking to L upper extremity with strength and sensation intact. He had EEG which did not show any seizure activity. Shortly after EEG was discontinued patient had tonic-clonic seizure; given ativan 2mg  x2 and keppra 1000mg . Longterm EEG showed multiple seizures and epileptogenicity. Neurology recommended increasing Keppra to 1000mg  BID.  In the evening of 10/2 the patient began experiencing new onset Left arm weakness and numbness. A code stroke was called and pt underwent imaging. Head CT showed concern for acute to subacute infarct. Brain MRI showed areas of edema and concern for acute hypoxic ischemic injury. All of this thought to have arisen secondary to pt's prior PEAs.   On 10/5 after patient had not exhibited any further seizure activity for 48 hours, neurology was comfortable discontinuing long-term EEG.  Pt was discharged on Keppra 1000mg  BID with intranasal Diazepam for breakthrough seizures.  Shock Liver LFTs elevated 5000s after initiating pressors. Improved off of pressors. LFTs downtrended appropriately to AST 35 and ALT 61 by 10/1.   Coagulopathy  Hemoperitoneum Possibly related to acute liver failure due to shock versus stasis in setting of prolonged  downtime. Ddx included DIC with elevated fibrinogen however no schistocytes on smear. Negative antiphospholipid workup. On 9/21 had oozing from lines, mouth and central line ascites tap with  blood. CT Abd/Pelvis with moderate-to-large volume abdominopelvic hemorrhage. Surgery was consulted who did not have to perform procedure inpatient due to stabilization. Repeat CTA without source of active extravasation. ANCA labs negative.  AKI  Normal Cr baseline. Cr rose to high 4's during ICU stay. Likely ATN in setting of multiple arrests and cardiogenic shock. Rhabdomyolysis in setting of seizure and possible prolonged downtime may be contributor. Considered renal vein thrombosis in setting of coagulopathy. Persistent hematuria since 2023 but GN workup this admission negative. Placed on lasix infusion 9/25. Discontinued Lasix on 10/3. Creatinine at discharge was 1.28.  HFrEF Echo with EF of 20-25%. Aggressively and appropriately resuscitated but net ~30 L at one point during ICU stay. Cardiology was consulted who recommended hydralazine 50mg  TID, Imdur 30mg , Coreg 6.25 BID, and spironolactone 25mg  daily.  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 Repeat cultures negative.   Antibiotic course - pip/tazo-9/19-9/23, cefepime 9/19, vancomycin 9/19, unasyn 9/23-9/26  Acute DVT of R Peroneal Vein Was found to have acute DVT of R Peroneal Vein. Anticoagulated with heparin and then transitioned to Eliquis. After seizure on 10/2 patient was temporarily transitioned to Lovenox as he was not tolerating PO medications. He restarted Eliquis on 10/3. He needs to remain on this for 6 months per heme/onc.  Hyperglycemia Required insulin drip in ICU d/t hyperglycemia to 400s. Normalized off of drip. A1c 5.7.   Other chronic conditions were medically managed with home medications and formulary alternatives as necessary (mood disorder - olanzapine 5 mg nightly)  PCP Follow-up Recommendations: Consider referral to GI for hemorrhoid banding Repeat BMP to check creatinine and potassium Repeat UA and Urine Protein/Creatinine ratio on follow up.  Follow up with Nephrology in  3-4 weeks, ensure follow up [facility] 07/18/2023 at 8:20 AM with Dr. Valentino Nose) When seizures stabilize, consider cognitive testing (given all that pt has been through with seizures, etc.) nephrology follow-up appointment is at [facility] 07/18/23 at 8:20 am with Dr. Valentino Nose  Consider DC protonix if not needed outpatient. Consider repeat CXR in 2 weeks, around 07/02/23  Disposition: home with HHPT  Discharge Condition: stable  Discharge Exam:  Vitals:   06/12/23 0835 06/12/23 1148  BP: (!) 127/95 (!) 129/95  Pulse: (!) 101 90  Resp:  18  Temp:  98.4 F (36.9 C)  SpO2:     Physical Exam: General: Young man sitting up in bed eating breakfast, cooperative and conversant Cardiovascular: tachycardic, regular rhythm, normal S1/S2, no murmurs, rubs, or gallops Respiratory: CTAB, normal WOB on room air Psych: appropriate mood and affect, pleasant and in good spirits  Significant Procedures: CPR, intubation, long term EEG  Significant Labs and Imaging:  No results for input(s): "WBC", "HGB", "HCT", "PLT" in the last 48 hours. Recent Labs  Lab 06/11/23 0556 06/12/23 0618  NA 140 139  K 3.9 3.3*  CL 111 110  CO2 16* 17*  GLUCOSE 84 84  BUN 7 8  CREATININE 1.32* 1.28*  CALCIUM 8.2* 7.9*    CXR 06/07/23 1. Moderate-sized right and small left pleural effusions without significant change. 2. Bibasilar atelectasis or pneumonia, right greater than left, with some improvement.  CT Head 1. New areas of cortical/subcortical hypoattenuation within both superior frontal lobes and the superior right parietal lobe, concerning for acute to subacute infarcts. 2. ASPECTS is 8.  MRI brain Multifocal cortical diffusion restriction within the frontal lobes with areas of edema also affecting the cingulate gyrus and left parietal, compatible with acute hypoxic ischemic injury.  Results/Tests Pending at Time of Discharge: none  Discharge Medications:  Allergies as of 06/12/2023        Reactions   Ibuprofen Anaphylaxis        Medication List     STOP taking these medications    amLODipine 10 MG tablet Commonly known as: NORVASC   divalproex 500 MG DR tablet Commonly known as: DEPAKOTE   fluconazole 200 MG tablet Commonly known as: DIFLUCAN   fluticasone 50 MCG/ACT nasal spray Commonly known as: FLONASE   hydrocortisone 2.5 % rectal cream Commonly known as: Anusol-HC   lisinopril 5 MG tablet Commonly known as: ZESTRIL   Pedialyte Soln       TAKE these medications    acetaminophen 325 MG tablet Commonly known as: TYLENOL Take 2 tablets (650 mg total) by mouth every 6 (six) hours as needed for mild pain or moderate pain.   albuterol (2.5 MG/3ML) 0.083% nebulizer solution Commonly known as: PROVENTIL Take 3 mLs (2.5 mg total) by nebulization every 6 (six) hours as needed for wheezing or shortness of breath.   Ventolin HFA 108 (90 Base) MCG/ACT inhaler Generic drug: albuterol Inhale 1-2 puffs into the lungs every 6 (six) hours as needed for wheezing or shortness of breath.   apixaban 5 MG Tabs tablet Commonly known as: ELIQUIS Take 1 tablet (5 mg total) by mouth 2 (two) times daily.   carvedilol 6.25 MG tablet Commonly known as: COREG Take 1 tablet (6.25 mg total) by mouth 2 (two) times daily with a meal.   Dulera 100-5 MCG/ACT Aero Generic drug: mometasone-formoterol INHALE TWO PUFFS INTO THE LUNGS TWICE A DAY   feeding supplement Liqd Take 237 mLs by mouth 2 (two) times daily between meals.   ferrous sulfate 324 (65 Fe) MG Tbec Take 1 tablet (325 mg total) by mouth daily at 12 noon.   folic acid 1 MG tablet Commonly known as: FOLVITE TAKE 1 TABLET (1 MG TOTAL) BY MOUTH DAILY (AM) What changed:  how much to take how to take this when to take this additional instructions   hydrALAZINE 50 MG tablet Commonly known as: APRESOLINE Take 1 tablet (50 mg total) by mouth every 8 (eight) hours.   levETIRAcetam 500 MG  tablet Commonly known as: KEPPRA Take 2 tablets (1,000 mg total) by mouth 2 (two) times daily.   losartan 50 MG tablet Commonly known as: COZAAR Take 1 tablet (50 mg total) by mouth daily. Start taking on: June 13, 2023   multivitamin with minerals Tabs tablet Take 1 tablet by mouth daily.   naltrexone 50 MG tablet Commonly known as: DEPADE TAKE 1/2 TABLET (25 MG TOTAL) BY MOUTH DAILY (AM) What changed:  how much to take how to take this when to take this additional instructions   OLANZapine 5 MG tablet Commonly known as: ZYPREXA Take 1 tablet (5 mg total) by mouth at bedtime. Future refills will need to come from psychiatry.   pantoprazole 40 MG tablet Commonly known as: PROTONIX TAKE 1 TABLET (40 MG TOTAL) BY MOUTH DAILY (AM) What changed:  how much to take how to take this when to take this additional instructions   phenylephrine-shark liver oil-mineral oil-petrolatum 0.25-14-74.9 % rectal ointment Commonly known as: PREPARATION H Place 1 Application rectally 2 (two) times daily as needed for hemorrhoids.   polyethylene glycol  17 g packet Commonly known as: MIRALAX / GLYCOLAX MIX AND DRINK BY MOUTH DAILY AS NEEDED FOR MODERATE CONSTIPATION   spironolactone 25 MG tablet Commonly known as: ALDACTONE Take 1 tablet (25 mg total) by mouth daily. Start taking on: June 13, 2023   thiamine 100 MG tablet Commonly known as: VITAMIN B1 Take 1 tablet (100 mg total) by mouth daily. Start taking on: June 13, 2023   Valtoco 15 MG Dose 7.5 MG/0.1ML Lqpk Generic drug: diazePAM (15 MG Dose) Place 2 inhalations into the nose for breakthrough seizures               Durable Medical Equipment  (From admission, onward)           Start     Ordered   06/09/23 1148  For home use only DME Shower stool  Once        06/09/23 1148   06/09/23 1148  For home use only DME Bedside commode  Once       Question Answer Comment  Patient needs a bedside commode to treat  with the following condition Heart failure [facility])   Patient needs a bedside commode to treat with the following condition Seizures (HCC)   Patient needs a bedside commode to treat with the following condition Physical deconditioning      06/09/23 1148   06/09/23 1146  For home use only DME lightweight manual wheelchair with seat cushion  Once       Comments: Patient suffers from Physical Deconditioning,Seizure, Heart Failure, Pressure Ulcer Left foot which impairs their ability to perform daily activities like bathing, dressing, feeding, grooming, and toileting in the home.  A cane or walker will not resolve  issue with performing activities of daily living. A wheelchair will allow patient to safely perform daily activities. Patient is not able to propel themselves in the home using a standard weight wheelchair due to endurance and general weakness. Patient can self propel in the lightweight wheelchair. Length of need Lifetime. Accessories: elevating leg rests (ELRs), wheel locks, extensions and anti-tippers, back cushion   06/09/23 1148   06/09/23 1059  For home use only DME Hospital bed  Once       Question Answer Comment  Length of Need 12 Months   Patient has (list medical condition): Seizures encephalopathy, Heart Failure   The above medical condition requires: Patient requires the ability to reposition frequently   Head must be elevated greater than: 45 degrees   Bed type Semi-electric   [employer] Yes   Trapeze Bar Yes   Support Surface: Gel Overlay      06/09/23 1109            Discharge Instructions: Please refer to Patient Instructions section of EMR for full details.  Patient was counseled important signs and symptoms that should prompt return to medical care, changes in medications, dietary instructions, activity restrictions, and follow up appointments.   Follow-Up Appointments:  Follow-up Information     Vonna Drafts, MD. Go in 5 day(s).   Specialty: Family  Medicine Why: Hospital follow up appointment scheduled for Tuesday, June 13, 2023 at 3:10PM.  PLEASE ARRIVE  10-15 minutes early.  PLEASE call and reschedule appointment if you CANNOT make it to the appointment. Contact information: [address] [place] [address] [phone]         Jodelle Red, MD Follow up on 06/21/2023.   Specialty: Cardiology Why: 2:20PM. Cardiology follow up Contact information: [address] [facility]  [place] [address] [phone]         Care, [facility] Follow up.   Specialty: Home Health Services Why: Registered Nurse, Physical and Occupational Therapy-office to call with visit times Contact information: [address] [place] [address] [phone]                 Lorayne Bender, MD 06/12/2023, 1:53 PM PGY-1, [facility]  Upper Level Addendum:  I have seen and evaluated this patient along with Dr. Dolan Amen and reviewed the above note, making necessary revisions.  Darral Dash, DO. 06/12/2023, 2:26 PM PGY-3, [facility]

## 2023-06-12 NOTE — Assessment & Plan Note (Signed)
Uncontrolled.  Continuing medications as below per cardiology, difficult to control given renal function and other comorbidities. - Cozaar, hydralazine per cards - Holding home amlodipine, lisinopril

## 2023-06-12 NOTE — Assessment & Plan Note (Signed)
Thought to be ATN 2/2 cardiac arrest. Cr as high as 4.6; now downtrending to 1.28 today 10/7.  ANCA workup negative. Lasix d/c'd by cardiology. - Nephrology consulted, appreciate recs - Strict I/O's - Daily weights - Avoid nephrotoxic drugs - Preferred narcotics for pain control will be hydromorphone, fentanyl, and methadone  - AM BMP - will monitor Cr for titration of Keppra per neurology

## 2023-06-13 ENCOUNTER — Encounter: Payer: Self-pay | Admitting: Family Medicine

## 2023-06-13 ENCOUNTER — Ambulatory Visit (INDEPENDENT_AMBULATORY_CARE_PROVIDER_SITE_OTHER): Payer: Medicare Other | Admitting: Family Medicine

## 2023-06-13 VITALS — BP 160/100 | HR 85 | Wt 141.2 lb

## 2023-06-13 DIAGNOSIS — J90 Pleural effusion, not elsewhere classified: Secondary | ICD-10-CM | POA: Diagnosis not present

## 2023-06-13 DIAGNOSIS — G40909 Epilepsy, unspecified, not intractable, without status epilepticus: Secondary | ICD-10-CM

## 2023-06-13 DIAGNOSIS — E876 Hypokalemia: Secondary | ICD-10-CM | POA: Diagnosis not present

## 2023-06-13 DIAGNOSIS — I10 Essential (primary) hypertension: Secondary | ICD-10-CM

## 2023-06-13 DIAGNOSIS — N179 Acute kidney failure, unspecified: Secondary | ICD-10-CM

## 2023-06-13 DIAGNOSIS — I469 Cardiac arrest, cause unspecified: Secondary | ICD-10-CM

## 2023-06-13 MED ORDER — CARVEDILOL 12.5 MG PO TABS
12.5000 mg | ORAL_TABLET | Freq: Two times a day (BID) | ORAL | 3 refills | Status: DC
Start: 2023-06-13 — End: 2023-06-30

## 2023-06-13 NOTE — Assessment & Plan Note (Signed)
Ordered repeat x-ray in 2 weeks given pleural effusion and possible pneumonia noted during his hospital stay.  Exam unremarkable today

## 2023-06-13 NOTE — Assessment & Plan Note (Signed)
Check BMP .

## 2023-06-13 NOTE — Progress Notes (Signed)
    SUBJECTIVE:   CHIEF COMPLAINT / HPI:   Here for hospital follow-up after admission 9/19-10/7 for seizures.  Required to stay during this admission.  Had cardiac arrest and distributive shock in the setting of PEA arrest.  Also with shock liver, AKI, sepsis, DVT, hyperglycemia, HFrEF, hemoperitoneum.  Copied from discharge summary: PCP Follow-up Recommendations: Consider referral to GI for hemorrhoid banding Repeat BMP to check creatinine and potassium Repeat UA and Urine Protein/Creatinine ratio on follow up.  Follow up with Nephrology in 3-4 weeks, ensure follow up [facility] 07/18/2023 at 8:20 AM with Dr. Valentino Nose) When seizures stabilize, consider cognitive testing (given all that pt has been through with seizures, etc.) nephrology follow-up appointment is at [facility] 07/18/23 at 8:20 am with Dr. Valentino Nose  Consider DC protonix if not needed outpatient. Consider repeat CXR in 2 weeks, around 07/02/23   Since discharge: Has been taking medications as prescribed, reviewed med list Feeling well Elevated BP - no current headaches, vision changes CP/SOB, Abd pain Using preparation H Has a nurse who visits him at home to help with dressings, meds, etc   PERTINENT  PMH / PSH: Hypertension, HFrEF, asthma, seizure disorder, coagulopathy, history of cocaine and benzo and alcohol use  OBJECTIVE:   BP (!) 160/100   Pulse 85   Wt 141 lb 4 oz (64.1 kg)   SpO2 100%   BMI 22.80 kg/m    General: NAD, pleasant, able to participate in exam Cardiac: RRR, no murmurs auscultated Respiratory: CTAB, normal WOB Abdomen: soft, non-tender, non-distended, normoactive bowel sounds Extremities: warm and well perfused, no edema or cyanosis.  Healing pressure ulcer left heel Neuro: PERRLA, EOMI, alert, no obvious focal deficits, speech and cognition normal Psych: Normal affect and mood  ASSESSMENT/PLAN:   Assessment & Plan Seizure disorder (HCC) Stable after hospital discharge.  Continue current regimen Hypertension, unspecified type BP elevated, asymptomatic. Increase coreg to 12.5mg  BID. F/u with cardiology next week, f/u with [facility] in 2 weeks. F/u nephrology in Nov. AKI (acute kidney injury) (HCC) Check BMP Pleural effusion, right Ordered repeat x-ray in 2 weeks given pleural effusion and possible pneumonia noted during his hospital stay.  Exam unremarkable today Hypokalemia Check BMP  Check urine and protein/creatinine at next visit - pt did not have/want to urinate today  Vonna Drafts, MD [facility]

## 2023-06-13 NOTE — Assessment & Plan Note (Signed)
BP elevated, asymptomatic. Increase coreg to 12.5mg  BID. F/u with cardiology next week, f/u with [facility] in 2 weeks. F/u nephrology in Nov.

## 2023-06-13 NOTE — Assessment & Plan Note (Signed)
Stable after hospital discharge. Continue current regimen

## 2023-06-13 NOTE — Patient Instructions (Addendum)
Follow up with cardiology on 10/16  Follow up with [facility] on 11/12 at 8:20 AM  I will let you know if any results from today are abnormal

## 2023-06-14 ENCOUNTER — Telehealth: Payer: Self-pay

## 2023-06-14 ENCOUNTER — Encounter: Payer: Self-pay | Admitting: Family Medicine

## 2023-06-14 LAB — BASIC METABOLIC PANEL
BUN/Creatinine Ratio: 9 (ref 9–20)
BUN: 9 mg/dL (ref 6–20)
CO2: 15 mmol/L — ABNORMAL LOW (ref 20–29)
Calcium: 8.5 mg/dL — ABNORMAL LOW (ref 8.7–10.2)
Chloride: 109 mmol/L — ABNORMAL HIGH (ref 96–106)
Creatinine, Ser: 1.03 mg/dL (ref 0.76–1.27)
Glucose: 73 mg/dL (ref 70–99)
Potassium: 4.2 mmol/L (ref 3.5–5.2)
Sodium: 140 mmol/L (ref 134–144)
eGFR: 98 mL/min/{1.73_m2} (ref >59)

## 2023-06-14 NOTE — Transitions of Care (Post Inpatient/ED Visit) (Signed)
   06/14/2023  Name: Timothy Hubbard MRN: [id number] DOB: [birth year]  Today's TOC FU Call Status: Today's TOC FU Call Status:: Unsuccessful Call (1st Attempt) Unsuccessful Call (1st Attempt) Date: 06/14/23  Attempted to reach the patient regarding the most recent Inpatient/ED visit.  Follow Up Plan: Additional outreach attempts will be made to reach the patient to complete the Transitions of Care (Post Inpatient/ED visit) call.   Deidre Ala, RN Medical illustrator [facility] [phone]

## 2023-06-14 NOTE — Telephone Encounter (Signed)
error 

## 2023-06-16 ENCOUNTER — Telehealth: Payer: Self-pay

## 2023-06-16 NOTE — Transitions of Care (Post Inpatient/ED Visit) (Signed)
   06/16/2023  Name: DEVONTAE CASASOLA MRN: [id number] DOB: [birth year]  Today's TOC FU Call Status: Today's TOC FU Call Status:: Unsuccessful Call (3rd Attempt) Unsuccessful Call (3rd Attempt) Date: 06/16/23  Attempted to reach the patient regarding the most recent Inpatient/ED visit.  Follow Up Plan: No further outreach attempts will be made at this time. We have been unable to contact the patient.  Deidre Ala, RN Medical illustrator [facility] [phone]

## 2023-06-19 ENCOUNTER — Telehealth: Payer: Self-pay

## 2023-06-19 NOTE — Telephone Encounter (Signed)
Rosanne Ashing [facility] PT calls nurse line requesting verbal orders for [facility] PT as follows.   1x a week for 8 weeks.   Verbal order given per [facility] protocol.   He does report his BP was 150/98 during home visit. He reports the patient has not taken all of his BP medications for the day yet, as he just woke up. Denies any symptoms at this time.   Advised he has an apt with cardiology on 10/16.

## 2023-06-21 ENCOUNTER — Ambulatory Visit (HOSPITAL_BASED_OUTPATIENT_CLINIC_OR_DEPARTMENT_OTHER): Payer: Medicare Other | Admitting: Cardiology

## 2023-06-21 ENCOUNTER — Encounter (HOSPITAL_BASED_OUTPATIENT_CLINIC_OR_DEPARTMENT_OTHER): Payer: Self-pay | Admitting: Cardiology

## 2023-06-21 VITALS — BP 130/60 | HR 93 | Ht 66.0 in | Wt 131.0 lb

## 2023-06-21 DIAGNOSIS — Z8674 Personal history of sudden cardiac arrest: Secondary | ICD-10-CM | POA: Diagnosis not present

## 2023-06-21 DIAGNOSIS — Z716 Tobacco abuse counseling: Secondary | ICD-10-CM

## 2023-06-21 DIAGNOSIS — I10 Essential (primary) hypertension: Secondary | ICD-10-CM | POA: Diagnosis not present

## 2023-06-21 DIAGNOSIS — F17210 Nicotine dependence, cigarettes, uncomplicated: Secondary | ICD-10-CM

## 2023-06-21 DIAGNOSIS — Z7189 Other specified counseling: Secondary | ICD-10-CM

## 2023-06-21 DIAGNOSIS — I5181 Takotsubo syndrome: Secondary | ICD-10-CM

## 2023-06-21 NOTE — Patient Instructions (Signed)
Medication Instructions:  NO CHANGES  *If you need a refill on your cardiac medications before your next appointment, please call your pharmacy*   Lab Work: NONE If you have labs (blood work) drawn today and your tests are completely normal, you will receive your results only by: MyChart Message (if you have MyChart) OR A paper copy in the mail If you have any lab test that is abnormal or we need to change your treatment, we will call you to review the results.   Testing/Procedures: NONE   Follow-Up: At [facility], you and your health needs are our priority.  As part of our continuing mission to provide you with exceptional heart care, we have created designated Provider Care Teams.  These Care Teams include your primary Cardiologist (physician) and Advanced Practice Providers (APPs -  Physician Assistants and Nurse Practitioners) who all work together to provide you with the care you need, when you need it.  We recommend signing up for the patient portal called "MyChart".  Sign up information is provided on this After Visit Summary.  MyChart is used to connect with patients for Virtual Visits (Telemedicine).  Patients are able to view lab/test results, encounter notes, upcoming appointments, etc.  Non-urgent messages can be sent to your provider as well.   To learn more about what you can do with MyChart, go to [email or web address].    Your next appointment:   6 month(s)  Provider:   Jodelle Red, MD    Other Instructions NONE

## 2023-06-21 NOTE — Progress Notes (Signed)
Cardiology Office Note:  .   Date:  06/21/2023  ID:  Timothy Hubbard, DOB August 30, 1991, MRN [id number] PCP: Vonna Drafts, MD  [facility] Providers Cardiologist:  Jodelle Red, MD {  History of Present Illness: .   Timothy Hubbard is a 32 y.o. male with PMH PEA arrest 2024, seizures, hypertension. I met him during his hospitalization in 2024.  Pertinent CV history: in 05/2023, he had a witnessed seizure followed by multiple PEA cardiac arrests. His initial ehco showed EF 60-65%, but he had another PEA arrest, and EF after that was 20-25%.  This was suspected to be 2/2 stress cardiomyopathy. EF recovered on recheck to 55-60%. Hospital course also complicated by hemoperitoneum, DIC, DVT, ATN, shock liver.  Today: Here with his mother today. Breathing slowly improving. No seizures since he has been home. No fevers/chills. Back to smoking, but now 4-5 cigarettes/day, down from 1-1.5 pack per day. Blood pressures well controlled.  ROS: Denies chest pain, shortness of breath at rest or with normal exertion. No PND, orthopnea, LE edema or unexpected weight gain. No syncope or palpitations. ROS otherwise negative except as noted.   Studies Reviewed: Marland Kitchen    EKG:       Physical Exam:   VS:  BP 130/60 (BP Location: Right Arm, Patient Position: Sitting, Cuff Size: Normal)   Pulse 93   Ht 5\' 6"  (1.676 m)   Wt 131 lb (59.4 kg)   SpO2 99%   BMI 21.14 kg/m    Wt Readings from Last 3 Encounters:  06/21/23 131 lb (59.4 kg)  06/13/23 141 lb 4 oz (64.1 kg)  06/12/23 154 lb 5.2 oz (70 kg)    GEN: Well nourished, well developed in no acute distress HEENT: Normal, moist mucous membranes NECK: No JVD CARDIAC: regular rhythm, normal S1 and S2, no rubs or gallops. No murmur. VASCULAR: Radial and DP pulses 2+ bilaterally. No carotid bruits RESPIRATORY:  Clear to auscultation without rales, wheezing or rhonchi  ABDOMEN: Soft, non-tender, non-distended MUSCULOSKELETAL:  Ambulates  independently SKIN: Warm and dry, no edema NEUROLOGIC:  Alert and oriented x 3. R arm in brace, reports gradually improving weakness PSYCHIATRIC:  Normal affect    ASSESSMENT AND PLAN: .    PEA arrest 2/2 seizure 2024 Stress induced cardiomyopathy with recovered EF Hypertension -at goal today -continue GDMT including carvedilol, losartan, spironolactone, hydralazine  Tobacco cessation: The patient was counseled on tobacco cessation today for 4 minutes.  Counseling included reviewing the risks of smoking tobacco products, how it impacts the patient's current medical diagnoses and different strategies for quitting.  Pharmacotherapy to aid in tobacco cessation was not prescribed today.   CV risk counseling and prevention -recommend heart healthy/Mediterranean diet, with whole grains, fruits, vegetable, fish, lean meats, nuts, and olive oil. Limit salt. -recommend moderate walking, 3-5 times/week for 30-50 minutes each session. Aim for at least 150 minutes.week. Goal should be pace of 3 miles/hours, or walking 1.5 miles in 30 minutes -recommend avoidance of tobacco products. Avoid excess alcohol. -ASCVD risk score: The ASCVD Risk score (Arnett DK, et al., 2019) failed to calculate for the following reasons:   The 2019 ASCVD risk score is only valid for ages 53 to 68    Dispo: 6 months or sooner as needed  Signed, Jodelle Red, MD   Jodelle Red, MD, PhD, [facility]  [facility]  [place]  [facility] V[facility] at [facility] at [facility] [address], [address] [place], [place] [address] [phone]   

## 2023-06-26 ENCOUNTER — Telehealth (HOSPITAL_BASED_OUTPATIENT_CLINIC_OR_DEPARTMENT_OTHER): Payer: Self-pay | Admitting: Cardiology

## 2023-06-26 NOTE — Telephone Encounter (Signed)
Labs received today and routed to provider for review.

## 2023-06-26 NOTE — Progress Notes (Deleted)
    SUBJECTIVE:   CHIEF COMPLAINT / HPI:   Here for f/u of HTN At last visit 10/8 his coreg was increased to 12.5mg  BID. Visited cardiologist office on 10/16 where his BP was well controlled 130/60 and was continued on current regimen Was also encouraged to get repeat CXR to f/u pleural effusion noted in hospital ***  PERTINENT  PMH / PSH: ***  OBJECTIVE:   There were no vitals taken for this visit.  ***  ASSESSMENT/PLAN:   Assessment & Plan Hypertension, unspecified type  Microscopic hematuria    Vonna Drafts, MD [facility]

## 2023-06-26 NOTE — Telephone Encounter (Signed)
Patient's mother is calling stating [facility] called reporting the patient has elevated lab results they are concerned with. She states they advised her they were sending the results to our office.   Please advise.

## 2023-06-27 ENCOUNTER — Ambulatory Visit: Payer: Self-pay | Admitting: Family Medicine

## 2023-06-27 DIAGNOSIS — I10 Essential (primary) hypertension: Secondary | ICD-10-CM

## 2023-06-27 DIAGNOSIS — R3129 Other microscopic hematuria: Secondary | ICD-10-CM

## 2023-06-27 NOTE — Telephone Encounter (Signed)
Returned call to patient's mother, advised we will call her once labs have been reviewed.

## 2023-06-27 NOTE — Telephone Encounter (Signed)
Mother Adela Lank) called to follow-up if [facility] lab results have been reviewed by Dr. Cristal Deer and next steps.

## 2023-06-28 ENCOUNTER — Other Ambulatory Visit: Payer: Self-pay | Admitting: Family Medicine

## 2023-06-28 ENCOUNTER — Ambulatory Visit
Admission: RE | Admit: 2023-06-28 | Discharge: 2023-06-28 | Disposition: A | Discharge status: Home / Self Care | Payer: Medicare Other | Source: Ambulatory Visit | Attending: Family Medicine | Admitting: Family Medicine

## 2023-06-28 DIAGNOSIS — R0989 Other specified symptoms and signs involving the circulatory and respiratory systems: Secondary | ICD-10-CM

## 2023-06-30 ENCOUNTER — Other Ambulatory Visit (HOSPITAL_COMMUNITY): Payer: Self-pay

## 2023-06-30 ENCOUNTER — Other Ambulatory Visit: Payer: Self-pay | Admitting: Family Medicine

## 2023-06-30 DIAGNOSIS — F1090 Alcohol use, unspecified, uncomplicated: Secondary | ICD-10-CM

## 2023-06-30 DIAGNOSIS — I10 Essential (primary) hypertension: Secondary | ICD-10-CM

## 2023-06-30 DIAGNOSIS — K219 Gastro-esophageal reflux disease without esophagitis: Secondary | ICD-10-CM

## 2023-06-30 DIAGNOSIS — D509 Iron deficiency anemia, unspecified: Secondary | ICD-10-CM

## 2023-06-30 DIAGNOSIS — F39 Unspecified mood [affective] disorder: Secondary | ICD-10-CM

## 2023-07-18 ENCOUNTER — Encounter: Payer: Self-pay | Admitting: Family Medicine

## 2023-07-19 MED FILL — Medication: Qty: 1 | Status: AC

## 2023-08-08 ENCOUNTER — Other Ambulatory Visit: Payer: Self-pay | Admitting: Family Medicine

## 2023-08-08 DIAGNOSIS — J45909 Unspecified asthma, uncomplicated: Secondary | ICD-10-CM

## 2023-08-09 ENCOUNTER — Telehealth: Payer: Self-pay

## 2023-08-09 NOTE — Telephone Encounter (Signed)
Timothy Hubbard [facility] PT with [facility] calls nurse line to report home health discharge.   He reports the patient is doing well and has met all HH PT goals.   Discharge effective today.

## 2023-08-25 ENCOUNTER — Telehealth: Payer: Self-pay

## 2023-08-25 NOTE — Telephone Encounter (Signed)
Patient's mother returns call to nurse line.  Mother reports that there is a stitch on his right side that has been there since last hospitalization in September/October.   Mother is worried about this potentially causing infection. Denies fever, chills, redness or drainage.   Mother reports that patient has established with [facility] as new PCP, however, they are unsure how they are supposed to proceed. They asked that she reach out to our office for further advisement.   Mother is asking if patient should come into our office for removal or if we could call new PCP office to advise them of next steps.   Will forward to Dr. Barb Merino- patient's previous PCP.   Veronda Prude, RN

## 2023-08-25 NOTE — Telephone Encounter (Signed)
Patient's mother LVM on nurse line requesting to speak with someone regarding patient needing a stitch removed.   Attempted to call mother back to get more information, she did not answer. LVM asking her to return call to office.   Veronda Prude, RN

## 2023-08-28 NOTE — Telephone Encounter (Signed)
Called patient's mother. Advised of message per Dr. Barb Merino.   Per chart review, it appears that stitch was placed by cardiology during hospitalization. Advised that patient should also follow up with cardiology regarding this removal.   Mother states that she is going to call cardiology office.   Veronda Prude, RN

## 2023-09-20 ENCOUNTER — Other Ambulatory Visit: Payer: Self-pay | Admitting: Family Medicine

## 2023-09-20 DIAGNOSIS — J45909 Unspecified asthma, uncomplicated: Secondary | ICD-10-CM

## 2023-11-14 ENCOUNTER — Other Ambulatory Visit (HOSPITAL_COMMUNITY): Payer: Self-pay

## 2023-11-15 ENCOUNTER — Other Ambulatory Visit (HOSPITAL_COMMUNITY): Payer: Self-pay

## 2023-11-24 ENCOUNTER — Ambulatory Visit
Admission: RE | Admit: 2023-11-24 | Discharge: 2023-11-24 | Disposition: A | Discharge status: Home / Self Care | Source: Ambulatory Visit | Attending: Family Medicine | Admitting: Family Medicine

## 2023-11-24 ENCOUNTER — Encounter: Payer: Self-pay | Admitting: Family Medicine

## 2023-11-24 ENCOUNTER — Other Ambulatory Visit: Payer: Self-pay | Admitting: Family Medicine

## 2023-11-24 DIAGNOSIS — J90 Pleural effusion, not elsewhere classified: Secondary | ICD-10-CM

## 2023-11-24 DIAGNOSIS — I82409 Acute embolism and thrombosis of unspecified deep veins of unspecified lower extremity: Secondary | ICD-10-CM

## 2023-11-30 ENCOUNTER — Other Ambulatory Visit: Payer: Self-pay | Admitting: Family Medicine

## 2023-11-30 ENCOUNTER — Other Ambulatory Visit

## 2023-11-30 DIAGNOSIS — I82409 Acute embolism and thrombosis of unspecified deep veins of unspecified lower extremity: Secondary | ICD-10-CM

## 2023-12-07 ENCOUNTER — Inpatient Hospital Stay: Admission: RE | Admit: 2023-12-07 | Source: Ambulatory Visit

## 2023-12-07 ENCOUNTER — Other Ambulatory Visit

## 2023-12-15 ENCOUNTER — Ambulatory Visit
Admission: RE | Admit: 2023-12-15 | Discharge: 2023-12-15 | Disposition: A | Discharge status: Home / Self Care | Payer: MEDICAID | Source: Ambulatory Visit | Attending: Family Medicine | Admitting: Family Medicine

## 2023-12-15 DIAGNOSIS — I82409 Acute embolism and thrombosis of unspecified deep veins of unspecified lower extremity: Secondary | ICD-10-CM

## 2023-12-18 ENCOUNTER — Encounter (HOSPITAL_BASED_OUTPATIENT_CLINIC_OR_DEPARTMENT_OTHER): Payer: Self-pay | Admitting: Family

## 2023-12-18 ENCOUNTER — Ambulatory Visit (HOSPITAL_BASED_OUTPATIENT_CLINIC_OR_DEPARTMENT_OTHER): Payer: Medicare Other | Admitting: Family

## 2023-12-18 VITALS — BP 110/60 | HR 91 | Ht 66.0 in | Wt 135.0 lb

## 2023-12-18 DIAGNOSIS — Z720 Tobacco use: Secondary | ICD-10-CM | POA: Diagnosis not present

## 2023-12-18 DIAGNOSIS — Z8674 Personal history of sudden cardiac arrest: Secondary | ICD-10-CM | POA: Diagnosis not present

## 2023-12-18 DIAGNOSIS — I5181 Takotsubo syndrome: Secondary | ICD-10-CM | POA: Diagnosis not present

## 2023-12-18 MED ORDER — HYDRALAZINE HCL 25 MG PO TABS: 25.0000 mg | ORAL_TABLET | Freq: Three times a day (TID) | ORAL | 3 refills | Status: DC

## 2023-12-18 NOTE — Progress Notes (Signed)
  Cardiology Office Note:  .   Date:  12/18/2023  ID:  Timothy Hubbard, DOB 1990/10/07, MRN [id number] PCP: Aviva Kluver  [facility] Providers Cardiologist:  Jodelle Red, MD    History of Present Illness: .   Timothy Hubbard is a 33 y.o. male with history of PEA arrest 2024, seizures, hypertension.  In September 2024 he had witnessed seizure followed by multiple PEA cardiac arrest.  Initial echo LVEF 60 to 35% but had another PEA arrest and EF after that 20-25%.  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  EF recovered on recheck to 55 to 60%.  Hospital course complicated by hemoperitoneum, DIC, DVT, ATN, shock liver.  Last seen by Dr. Cristal Deer  06/2023.  No seizures since discharge.  He was smoking 4 to 5 cigarettes/day which is decreased from previous 1 to 1.5 packs/day.  BP was at goal and he was recommended to continue carvedilol, losartan, spironolactone, hydralazine.  Presents today for follow-up. Since last seen established with [facility] for primary care. Medications through adherence packaging with [facility] taken routinely 7a, 12p, 5p, 11p. Reports no shortness of breath nor dyspnea on exertion. Reports no chest pain, pressure, or tightness. No edema, orthopnea, PND. Reports no palpitations.  Has BP cuff but not monitoring routinely at home. Interested in simplifying medical regimen as has been on cardiomyopathy medications for 6 months.   ROS: Please see the history of present illness.    All other systems reviewed and are negative.   Studies Reviewed: .         Risk Assessment/Calculations:             Physical Exam:   VS:  BP 92/64 (BP Location: Right Arm, Patient Position: Sitting, Cuff Size: Normal)   Pulse 91   Ht 5\' 6"  (1.676 m)   Wt 135 lb (61.2 kg)   SpO2 98%   BMI 21.79 kg/m     Vitals:   12/18/23 0842 12/18/23 0856  BP: 92/64 110/60  Pulse: 91   Height: 5\' 6"  (1.676 m)   Weight: 135 lb (61.2 kg)   SpO2: 98%   BMI  (Calculated): 21.8     Wt Readings from Last 3 Encounters:  12/18/23 135 lb (61.2 kg)  06/21/23 131 lb (59.4 kg)  06/13/23 141 lb 4 oz (64.1 kg)    GEN: Well nourished, well developed in no acute distress NECK: No JVD; No carotid bruits CARDIAC: RRR, no murmurs, rubs, gallops RESPIRATORY:  Clear to auscultation without rales, wheezing or rhonchi  ABDOMEN: Soft, non-tender, non-distended EXTREMITIES:  No edema; No deformity   ASSESSMENT AND PLAN: .    PEA arrest 2/2 seizure 05/2023 / Stress induced cardiomyopathy with recovered LVEF / HTN - Hypotensive today but asymptomatic with no lightheadedness, dizziness. As he has been on cardiomyopathy regimen for 6 months and now with hypotension, will begin to simplify. Euvolemic and well compensated on exam. NYHA I. Reduce Hydralazine from 50mg  TID to 25mg  BID.  Continue carvedilol 12.5 mg twice daily, losartan 50 mg daily, spironolactone 25 mg daily.  Has upcoming labs with PCP 01/08/2024.  Tobacco use - Smoking cessation encouraged. Recommend utilization of 1800QUITNOW.        Dispo: follow up in 6 months  Signed, Alver Sorrow, NP

## 2023-12-18 NOTE — Patient Instructions (Signed)
 Medication Instructions:  Your physician has recommended you make the following change in your medication:   Change: Hydralazine 25mg  twice daily   Follow-Up: At [facility], you and your health needs are our priority.  As part of our continuing mission to provide you with exceptional heart care, our providers are all part of one team.  This team includes your primary Cardiologist (physician) and Advanced Practice Providers or APPs (Physician Assistants and Nurse Practitioners) who all work together to provide you with the care you need, when you need it.  Your next appointment:   6 months with Dr. Veryl Gottron, Neomi Banks, NP or Slater Duncan, NP   We recommend signing up for the patient portal called "MyChart".  Sign up information is provided on this After Visit Summary.  MyChart is used to connect with patients for Virtual Visits (Telemedicine).  Patients are able to view lab/test results, encounter notes, upcoming appointments, etc.  Non-urgent messages can be sent to your provider as well.   To learn more about what you can do with MyChart, go to [email or web address].   Other Instructions We will follow up on Blood pressure in 3 weeks

## 2024-01-08 ENCOUNTER — Telehealth (HOSPITAL_BASED_OUTPATIENT_CLINIC_OR_DEPARTMENT_OTHER): Payer: Self-pay

## 2024-01-08 NOTE — Telephone Encounter (Signed)
-----   Message from Clearnce Curia sent at 01/08/2024  8:54 AM EDT ----- Hi team!  Can we please call Mr. Ureste to check in on BP since last visit? TY! ----- Message ----- From: Guss Legacy, RN Sent: 01/08/2024  12:00 AM EDT To: Guss Legacy, RN  BP check in

## 2024-01-08 NOTE — Telephone Encounter (Signed)
 Called and spoke to pt's mother. (OK per DPR) Pt has no BP readings available at home. Pt had appt today but Dr. was out sick and has f/u on 5/12. Will call back next week to check on BP. Pt's mom verbalized understanding.

## 2024-01-15 ENCOUNTER — Telehealth (HOSPITAL_BASED_OUTPATIENT_CLINIC_OR_DEPARTMENT_OTHER): Payer: Self-pay

## 2024-01-15 ENCOUNTER — Other Ambulatory Visit: Payer: Self-pay

## 2024-01-15 ENCOUNTER — Emergency Department (HOSPITAL_COMMUNITY)
Admission: EM | Admit: 2024-01-15 | Discharge: 2024-01-15 | Disposition: A | Discharge status: Home / Self Care | Attending: Emergency Medicine | Admitting: Emergency Medicine

## 2024-01-15 ENCOUNTER — Encounter (HOSPITAL_COMMUNITY): Payer: Self-pay

## 2024-01-15 DIAGNOSIS — Z7901 Long term (current) use of anticoagulants: Secondary | ICD-10-CM | POA: Insufficient documentation

## 2024-01-15 DIAGNOSIS — I509 Heart failure, unspecified: Secondary | ICD-10-CM | POA: Insufficient documentation

## 2024-01-15 DIAGNOSIS — M549 Dorsalgia, unspecified: Secondary | ICD-10-CM | POA: Insufficient documentation

## 2024-01-15 DIAGNOSIS — R000 Tachycardia, unspecified: Secondary | ICD-10-CM | POA: Insufficient documentation

## 2024-01-15 LAB — BASIC METABOLIC PANEL WITH GFR
Anion gap: 15 (ref 5–15)
BUN: 10 mg/dL (ref 6–20)
CO2: 19 mmol/L — ABNORMAL LOW (ref 22–32)
Calcium: 8.6 mg/dL — ABNORMAL LOW (ref 8.9–10.3)
Chloride: 104 mmol/L (ref 98–111)
Creatinine, Ser: 1.05 mg/dL (ref 0.61–1.24)
GFR, Estimated: >60 mL/min (ref >60)
Glucose, Bld: 87 mg/dL (ref 70–99)
Potassium: 4.1 mmol/L (ref 3.5–5.1)
Sodium: 138 mmol/L (ref 135–145)

## 2024-01-15 LAB — CBC
HCT: 30.1 % — ABNORMAL LOW (ref 39.0–52.0)
Hemoglobin: 10.4 g/dL — ABNORMAL LOW (ref 13.0–17.0)
MCH: 35.4 pg — ABNORMAL HIGH (ref 26.0–34.0)
MCHC: 34.6 g/dL (ref 30.0–36.0)
MCV: 102.4 fL — ABNORMAL HIGH (ref 80.0–100.0)
Platelets: 215 10*3/uL (ref 150–400)
RBC: 2.94 MIL/uL — ABNORMAL LOW (ref 4.22–5.81)
RDW: 14.5 % (ref 11.5–15.5)
WBC: 6.7 10*3/uL (ref 4.0–10.5)
nRBC: 0 % (ref 0.0–0.2)

## 2024-01-15 LAB — MAGNESIUM: Magnesium: 1.7 mg/dL (ref 1.7–2.4)

## 2024-01-15 NOTE — Telephone Encounter (Signed)
 Called and spoke to pt's mother. Pt is currently at doctor's appt - mother will call back with BP reading.

## 2024-01-15 NOTE — ED Notes (Signed)
 RN updated mother, mother states she is on the way. Pt axox4, ambulatory

## 2024-01-15 NOTE — Discharge Instructions (Signed)
 You were seen for your elevated heart rate in the emergency department.   At home, please stay well hydrated.    Check your MyChart online for the results of any tests that had not resulted by the time you left the emergency department.   Follow-up with your primary doctor in 2-3 days regarding your visit.    Return immediately to the emergency department if you experience any of the following: chest pain, palpitations, shortness of breath, fainting, or any other concerning symptoms.    Thank you for visiting our Emergency Department. It was a pleasure taking care of you today.

## 2024-01-15 NOTE — Telephone Encounter (Signed)
 Pt called in stating his other doctor advised him to go to the ED because the hr was high. She states she is not sure what the exact reading was, but they are headed to ED now

## 2024-01-15 NOTE — ED Triage Notes (Addendum)
 Pt C/O mid back pain. Hx of seizures, MI, and Cardiac arrest. Pt was at PCP and EKG showed ST depression. Denies SHOB and CP. Complaint with seizure meds

## 2024-01-15 NOTE — ED Provider Notes (Signed)
 [place] EMERGENCY DEPARTMENT AT [facility] Provider Note   CSN: [id number] Arrival date & time: 01/15/24  1704     History {Add pertinent medical, surgical, social history, OB history to HPI:1} Chief Complaint  Patient presents with   Back Pain    Timothy Hubbard is a 33 y.o. male.  33 year old male with a history of cardiac arrest, epilepsy, CHF, and DVT who presents emergency department as referral by primary care for elevated heart rate.  Patient reports he was going for a routine checkup today.  Went to the primary care office and was told that his heart rate was elevated.  Unsure what the heart rate was.  He unfortunately did not have an EKG and there is some concern that there may have been faulty equipment there.  Was taken over by ambulance and their EKG showed normal sinus rhythm.  He denies any palpitations, bleeding, chest pain, shortness of breath.       Home Medications Prior to Admission medications   Medication Sig Start Date End Date Taking? Authorizing Provider  acetaminophen  (TYLENOL ) 325 MG tablet Take 2 tablets (650 mg total) by mouth every 6 (six) hours as needed for mild pain or moderate pain. 03/30/22   Joelle Musca, MD  albuterol  (PROVENTIL ) (2.5 MG/3ML) 0.083% nebulizer solution Take 3 mLs (2.5 mg total) by nebulization every 6 (six) hours as needed for wheezing or shortness of breath. 04/04/23   Edison Gore, MD  apixaban  (ELIQUIS ) 5 MG TABS tablet Take 1 tablet (5 mg total) by mouth 2 (two) times daily. 06/12/23   Dameron, Marisa, DO  carvedilol  (COREG ) 12.5 MG tablet TAKE 1 TABLET BY MOUTH 2 (TWO) TIMES DAILY WITH A MEAL (AM+PM) 06/30/23   Edison Gore, MD  diazePAM , 15 MG Dose, (VALTOCO  15 MG DOSE) 2 x 7.5 MG/0.1ML LQPK Place 2 inhalations into the nose for breakthrough seizures 06/12/23   Dameron, Marisa, DO  feeding supplement (ENSURE ENLIVE / ENSURE PLUS) LIQD Take 237 mLs by mouth 2 (two) times daily between meals. 04/24/23   Barbee Lew, MD  ferrous sulfate  (FEROSUL) 325 (65 FE) MG tablet TAKE 1 TABLET (325 MG TOTAL) BY MOUTH DAILY AT 12 NOON. 06/30/23   Edison Gore, MD  folic acid  (FOLVITE ) 1 MG tablet TAKE 1 TABLET (1 MG TOTAL) BY MOUTH DAILY (AM) 06/30/23   Edison Gore, MD  hydrALAZINE  (APRESOLINE ) 25 MG tablet Take 1 tablet (25 mg total) by mouth every 8 (eight) hours. 12/18/23   Clearnce Curia, NP  levETIRAcetam  (KEPPRA ) 500 MG tablet Take 2 tablets (1,000 mg total) by mouth 2 (two) times daily. 06/12/23   Dameron, Marisa, DO  losartan  (COZAAR ) 50 MG tablet Take 1 tablet (50 mg total) by mouth daily. 06/13/23   Dameron, Marisa, DO  mometasone -formoterol  (DULERA ) 100-5 MCG/ACT AERO INHALE TWO PUFFS INTO THE LUNGS TWICE A DAY 06/12/23   Dameron, Marisa, DO  Multiple Vitamin (MULTIVITAMIN WITH MINERALS) TABS tablet Take 1 tablet by mouth daily. 04/04/23   Edison Gore, MD  naltrexone  (DEPADE) 50 MG tablet TAKE 1/2 TABLET (25 MG TOTAL) BY MOUTH DAILY (AM) 06/30/23   Edison Gore, MD  OLANZapine  (ZYPREXA ) 5 MG tablet TAKE 1 TABLET (5 MG TOTAL) BY MOUTH AT BEDTIME. 06/30/23   Edison Gore, MD  pantoprazole  (PROTONIX ) 40 MG tablet TAKE 1 TABLET (40 MG TOTAL) BY MOUTH DAILY (AM) 06/30/23   Edison Gore, MD  phenylephrine -shark liver oil-mineral oil-petrolatum  (PREPARATION H) 0.25-14-74.9 % rectal ointment Place 1 Application rectally  2 (two) times daily as needed for hemorrhoids. 06/12/23   Dameron, Marisa, DO  polyethylene glycol (MIRALAX  / GLYCOLAX ) 17 g packet MIX AND DRINK BY MOUTH DAILY AS NEEDED FOR MODERATE CONSTIPATION 04/04/23   Edison Gore, MD  spironolactone  (ALDACTONE ) 25 MG tablet Take 1 tablet (25 mg total) by mouth daily. 06/13/23   Dameron, Marisa, DO  thiamine  (VITAMIN B1) 100 MG tablet Take 1 tablet (100 mg total) by mouth daily. 06/13/23   Dameron, Marisa, DO  VENTOLIN  HFA 108 (90 Base) MCG/ACT inhaler INHALE 1-2 PUFFS INTO THE LUNGS EVERY 6 (SIX) HOURS AS NEEDED FOR WHEEZING OR SHORTNESS OF BREATH. 08/08/23    Edison Gore, MD      Allergies    Ibuprofen     Review of Systems   Review of Systems  Physical Exam Updated Vital Signs BP 98/73   Pulse 86   Temp 98.7 F (37.1 C) (Oral)   Resp (!) 23   Ht 5\' 6"  (1.676 m)   Wt 59 kg   SpO2 100%   BMI 20.98 kg/m  Physical Exam Vitals and nursing note reviewed.  Constitutional:      General: He is not in acute distress.    Appearance: He is well-developed.  HENT:     Head: Normocephalic and atraumatic.     Right Ear: External ear normal.     Left Ear: External ear normal.     Nose: Nose normal.  Eyes:     Extraocular Movements: Extraocular movements intact.     Conjunctiva/sclera: Conjunctivae normal.     Pupils: Pupils are equal, round, and reactive to light.  Cardiovascular:     Rate and Rhythm: Normal rate and regular rhythm.     Heart sounds: Normal heart sounds.  Pulmonary:     Effort: Pulmonary effort is normal. No respiratory distress.     Breath sounds: Normal breath sounds.  Musculoskeletal:     Cervical back: Normal range of motion and neck supple.     Right lower leg: No edema.     Left lower leg: No edema.  Skin:    General: Skin is warm and dry.  Neurological:     Mental Status: He is alert. Mental status is at baseline.  Psychiatric:        Mood and Affect: Mood normal.        Behavior: Behavior normal.     ED Results / Procedures / Treatments   Labs (all labs ordered are listed, but only abnormal results are displayed) Labs Reviewed - No data to display  EKG EKG Interpretation Date/Time:  Monday Jan 15 2024 17:15:44 EDT Ventricular Rate:  88 PR Interval:  151 QRS Duration:  94 QT Interval:  365 QTC Calculation: 442 R Axis:   65  Text Interpretation: Sinus rhythm RSR' in V1 or V2, probably normal variant Minimal ST depression, anterolateral leads Confirmed by Shyrl Doyne [phone]) on 01/15/2024 6:02:31 PM  Radiology No results found.  Procedures Procedures  {Document cardiac monitor,  telemetry assessment procedure when appropriate:1}  Medications Ordered in ED Medications - No data to display  ED Course/ Medical Decision Making/ A&P   {   Click here for ABCD2, HEART and other calculatorsREFRESH Note before signing :1}                              Medical Decision Making Amount and/or Complexity of Data Reviewed Labs: ordered.   ***  {Document  critical care time when appropriate:1} {Document review of labs and clinical decision tools ie heart score, Chads2Vasc2 etc:1}  {Document your independent review of radiology images, and any outside records:1} {Document your discussion with family members, caretakers, and with consultants:1} {Document social determinants of health affecting pt's care:1} {Document your decision making why or why not admission, treatments were needed:1} Final Clinical Impression(s) / ED Diagnoses Final diagnoses:  None    Rx / DC Orders ED Discharge Orders     None

## 2024-01-16 NOTE — Telephone Encounter (Signed)
 ED visit reviewed. Was sent by PCP via EMS due to tachycardia though maintained NSR with EMS and EKG in ED NSR 88 bpm which is normal finding. ED vital signs reviewed with BP 112/66, 103/68, 98/73, 107/79. Labs were stable from previous.   Please request most recent note from [facility] PCP.  As BP persistently controlled, may discontinue Hydralazine . Please call [facility] to make them aware. If BP routinely >130/80 at home, please contact our office.   Opha Mcghee S Briell Paulette, NP

## 2024-01-16 NOTE — Telephone Encounter (Signed)
 Called and spoke to care member for recent OV note to be faxed over. Request made - informed by care member that it  may take up to one business day for fax request to be worked on and sent to our office.

## 2024-01-16 NOTE — Telephone Encounter (Signed)
 Called and left voice message to call back

## 2024-01-16 NOTE — Telephone Encounter (Signed)
 Called and left message for pharmacy to call back regarding med change. Will call again at later time.

## 2024-01-16 NOTE — Telephone Encounter (Signed)
 Called and spoke to pt's mother. Discussed APP's recommendations - mother stated she was hesitant on dropping another medications off but was at doctor's appointment. Will call at a different time to discuss further.

## 2024-01-17 NOTE — Addendum Note (Signed)
 Addended by: Zorita Hiss on: 01/17/2024 09:39 AM   Modules accepted: Orders

## 2024-01-17 NOTE — Telephone Encounter (Signed)
 Called and spoke to patient. Patient verified with 2 identifiers (DOB and Last Name)  Patient states:  - [facility] nurse came to house today and pt's BP was 98/80s x2.  - Mother is worried of taking pt off of all his BP medications and pt will have another stroke.  Nurse's Recommendations:  - Advised pt's mother to stop Hydralazine  as ordered by APP.  - Monitor BP.  Patient verbalized understanding and will call pharmacy to remove Hydralazine  from bubble packs.

## 2024-01-17 NOTE — Telephone Encounter (Signed)
  Pt c/o BP issue: STAT if pt c/o blurred vision, one-sided weakness or slurred speech.  STAT if BP is GREATER than 180/120 TODAY.  STAT if BP is LESS than 90/60 and SYMPTOMATIC TODAY  1. What is your BP concern? Pt mother concerned about bp. She states hh nurse was there today to take bp and it was still low, please advise.   2. Have you taken any BP medication today? Yes   3. What are your last 5 BP readings?  Bp today: 98/97 x2   4. Are you having any other symptoms (ex. Dizziness, headache, blurred vision, passed out)?  No

## 2024-02-26 ENCOUNTER — Other Ambulatory Visit: Payer: Self-pay | Admitting: Family Medicine

## 2024-02-26 DIAGNOSIS — J45909 Unspecified asthma, uncomplicated: Secondary | ICD-10-CM

## 2024-02-26 DIAGNOSIS — K642 Third degree hemorrhoids: Secondary | ICD-10-CM

## 2024-04-12 ENCOUNTER — Telehealth: Payer: Self-pay | Admitting: Cardiology

## 2024-04-12 NOTE — Telephone Encounter (Signed)
 Agree with recommendation to hold Hydralazine . Okay to hold Losartan , Spironolactone . Would check BP prior to meds and take Carvedilol  if SBP >110. May take dose of Hydralazine  25mg  PRN for SBP >150.  Valta Dillon S Jalyric Kaestner, NP

## 2024-04-12 NOTE — Telephone Encounter (Signed)
 Spoke with mother and patient was seen by PCP yesterday, they are holding a medication (Hydralazine ) until can be seen by cardiology  Scheduled appointment for Monday with Reche ORN NP   106/70 blood pressure HR 89 now    No Carvedilol , Losartan , or Spironolactone  this am. Only took seizure medications   Will forward to Reche ORN NP for review

## 2024-04-12 NOTE — Telephone Encounter (Signed)
 Pt c/o BP issue: STAT if pt c/o blurred vision, one-sided weakness or slurred speech.  STAT if BP is GREATER than 180/120 TODAY.  STAT if BP is LESS than 90/60 and SYMPTOMATIC TODAY  1. What is your BP concern? Pt's mother is calling to schedule appt due to pt's BP being low  2. Have you taken any BP medication today? No  3. What are your last 5 BP readings? Low: 58 Low: 85   4. Are you having any other symptoms (ex. Dizziness, headache, blurred vision, passed out)? Dizziness and fainting last week

## 2024-04-12 NOTE — Telephone Encounter (Signed)
 Advised mother, verbalized understanding

## 2024-04-15 ENCOUNTER — Ambulatory Visit (INDEPENDENT_AMBULATORY_CARE_PROVIDER_SITE_OTHER): Admitting: Family

## 2024-04-15 ENCOUNTER — Encounter (HOSPITAL_BASED_OUTPATIENT_CLINIC_OR_DEPARTMENT_OTHER): Payer: Self-pay | Admitting: Family

## 2024-04-15 VITALS — BP 120/80 | HR 84 | Ht 66.0 in | Wt 124.0 lb

## 2024-04-15 DIAGNOSIS — Z8674 Personal history of sudden cardiac arrest: Secondary | ICD-10-CM

## 2024-04-15 DIAGNOSIS — D649 Anemia, unspecified: Secondary | ICD-10-CM | POA: Diagnosis not present

## 2024-04-15 DIAGNOSIS — Z720 Tobacco use: Secondary | ICD-10-CM

## 2024-04-15 DIAGNOSIS — I10 Essential (primary) hypertension: Secondary | ICD-10-CM | POA: Diagnosis not present

## 2024-04-15 MED ORDER — CARVEDILOL 3.125 MG PO TABS: 3.1250 mg | ORAL_TABLET | Freq: Two times a day (BID) | ORAL | 5 refills | Status: DC

## 2024-04-15 MED ORDER — LOSARTAN POTASSIUM 25 MG PO TABS: 12.5000 mg | ORAL_TABLET | Freq: Every evening | ORAL | 5 refills | Status: DC

## 2024-04-15 NOTE — Progress Notes (Signed)
 Cardiology Office Note:  .   Date:  04/15/2024  ID:  Timothy Hubbard, DOB Jan 07, 1991, MRN [id number] PCP: Freddrick Johns  [facility] Providers Cardiologist:  Shelda Bruckner, MD    History of Present Illness: .   Timothy Hubbard is a 33 y.o. male with history of PEA arrest 2024, seizures, hypertension, DVT 05/2023 (completed 6 mos OAC).  In September 2024 he had witnessed seizure followed by multiple PEA cardiac arrest.  Initial echo LVEF 60 to 35% but had another PEA arrest and EF after that 20-25%.  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  EF recovered on recheck to 55 to 60%.  Hospital course complicated by hemoperitoneum, DIC, DVT, ATN, shock liver.  Seen by Dr. Bruckner  06/2023.  No seizures since discharge.  He was smoking 4 to 5 cigarettes/day which is decreased from previous 1 to 1.5 packs/day.  BP was at goal and he was recommended to continue carvedilol , losartan , spironolactone , hydralazine .  Last seen 12/18/23.  He was getting medications for adherence packaging with [facility].  Due to hypotension hydralazine  reduced from 50 mg 3 times daily to 25 mg twice daily.  He was overall asymptomatic regarding his hypotension.  Presents today for follow-up with his mother. Notes PCP stopped 3 medications (likely Carvedilol , Losartan , spironolactone ) and changed Hydralazine  to only if SBP >110. No near syncope, syncope but has felt fatigued with hypotension. No chest pain, exertional dyspnea, palpitations, edema. He sees PCP again upcoming 04/26/24.   Home BP cuff: 122/94 Manual BP: 120/80  ROS: Please see the history of present illness.    All other systems reviewed and are negative.   Studies Reviewed: .       Cardiac Studies & Procedures   ______________________________________________________________________________________________     ECHOCARDIOGRAM  ECHOCARDIOGRAM COMPLETE 06/03/2023  Narrative ECHOCARDIOGRAM REPORT    Patient Name:   Timothy Hubbard Date of Exam: 06/03/2023 Medical Rec #:  [id number]     Height:       66.0 in Accession #:    [id number]    Weight:       186.9 lb Date of Birth:  October 29, 1990     BSA:          1.943 m Patient Age:    32 years      BP:           137/97 mmHg Patient Gender: M             HR:           63 bpm. Exam Location:  Inpatient  Procedure: 2D Echo, Cardiac Doppler, Color Doppler and Strain Analysis  Indications:    Congestive Heart Failure I50.9  History:        Patient has prior history of Echocardiogram examinations. Risk Factors:Hypertension.  Sonographer:    Bari Roar Referring Phys: [phone] [facility]   Sonographer Comments: Global longitudinal strain was attempted. IMPRESSIONS   1. Left ventricular ejection fraction, by estimation, is 55 to 60%. The left ventricle has normal function. The left ventricle has no regional wall motion abnormalities. Left ventricular diastolic parameters were normal. 2. Right ventricular systolic function is normal. The right ventricular size is normal. There is mildly elevated pulmonary artery systolic pressure. The estimated right ventricular systolic pressure is 42.5 mmHg. 3. Large pleural effusion in the left lateral region. 4. The mitral valve is normal in structure. Trivial mitral valve regurgitation. No evidence of mitral stenosis. 5. The aortic valve has an  indeterminant number of cusps. Aortic valve regurgitation is not visualized. No aortic stenosis is present. 6. The inferior vena cava is dilated in size with <50% respiratory variability, suggesting right atrial pressure of 15 mmHg.  Comparison(s): Changes from prior study are noted. LVEF improved from 25% to normal now.  FINDINGS Left Ventricle: Left ventricular ejection fraction, by estimation, is 55 to 60%. The left ventricle has normal function. The left ventricle has no regional wall motion abnormalities. The left ventricular internal cavity size was normal in size. There is no left  ventricular hypertrophy. Left ventricular diastolic parameters were normal.  Right Ventricle: The right ventricular size is normal. No increase in right ventricular wall thickness. Right ventricular systolic function is normal. There is mildly elevated pulmonary artery systolic pressure. The tricuspid regurgitant velocity is 2.62 m/s, and with an assumed right atrial pressure of 15 mmHg, the estimated right ventricular systolic pressure is 42.5 mmHg.  Left Atrium: Left atrial size was normal in size.  Right Atrium: Right atrial size was normal in size.  Pericardium: There is no evidence of pericardial effusion.  Mitral Valve: The mitral valve is normal in structure. Trivial mitral valve regurgitation. No evidence of mitral valve stenosis. MV peak gradient, 5.5 mmHg. The mean mitral valve gradient is 2.0 mmHg.  Tricuspid Valve: The tricuspid valve is normal in structure. Tricuspid valve regurgitation is mild . No evidence of tricuspid stenosis.  Aortic Valve: The aortic valve has an indeterminant number of cusps. Aortic valve regurgitation is not visualized. No aortic stenosis is present. Aortic valve mean gradient measures 3.0 mmHg. Aortic valve peak gradient measures 6.0 mmHg. Aortic valve area, by VTI measures 3.26 cm.  Pulmonic Valve: The pulmonic valve was normal in structure. Pulmonic valve regurgitation is trivial. No evidence of pulmonic stenosis.  Aorta: The aortic root and ascending aorta are structurally normal, with no evidence of dilitation.  Venous: The inferior vena cava is dilated in size with less than 50% respiratory variability, suggesting right atrial pressure of 15 mmHg.  IAS/Shunts: No atrial level shunt detected by color flow Doppler.  Additional Comments: There is a large pleural effusion in the left lateral region.   LEFT VENTRICLE PLAX 2D LVIDd:         4.00 cm   Diastology LVIDs:         3.00 cm   LV e' medial:    14.10 cm/s LV PW:         0.70 cm   LV E/e'  medial:  7.5 LV IVS:        0.90 cm   LV e' lateral:   15.00 cm/s LVOT diam:     2.00 cm   LV E/e' lateral: 7.1 LV SV:         70 LV SV Index:   36 LVOT Area:     3.14 cm   RIGHT VENTRICLE RV Basal diam:  3.00 cm RV Mid diam:    2.40 cm RV S prime:     11.90 cm/s TAPSE (M-mode): 2.0 cm  LEFT ATRIUM             Index        RIGHT ATRIUM           Index LA diam:        3.50 cm 1.80 cm/m   RA Area:     11.50 cm LA Vol (A2C):   55.3 ml 28.46 ml/m  RA Volume:   24.50 ml  12.61 ml/m LA  Vol (A4C):   61.6 ml 31.70 ml/m LA Biplane Vol: 58.4 ml 30.06 ml/m AORTIC VALVE                    PULMONIC VALVE AV Area (Vmax):    2.99 cm     PV Vmax:          0.94 m/s AV Area (Vmean):   2.93 cm     PV Peak grad:     3.5 mmHg AV Area (VTI):     3.26 cm     PR End Diast Vel: 6.10 msec AV Vmax:           122.00 cm/s  RVOT Peak grad:   3 mmHg AV Vmean:          81.400 cm/s AV VTI:            0.215 m AV Peak Grad:      6.0 mmHg AV Mean Grad:      3.0 mmHg LVOT Vmax:         116.00 cm/s LVOT Vmean:        76.000 cm/s LVOT VTI:          0.223 m LVOT/AV VTI ratio: 1.04  AORTA Ao Sinus diam: 2.30 cm Ao STJ diam:   2.3 cm Ao Asc diam:   2.60 cm  MITRAL VALVE                TRICUSPID VALVE MV Area (PHT): 4.57 cm     TR Peak grad:   27.5 mmHg MV Area VTI:   1.81 cm     TR Vmax:        262.00 cm/s MV Peak grad:  5.5 mmHg MV Mean grad:  2.0 mmHg     SHUNTS MV Vmax:       1.17 m/s     Systemic VTI:  0.22 m MV Vmean:      67.2 cm/s    Systemic Diam: 2.00 cm MV Decel Time: 166 msec MV E velocity: 106.00 cm/s MV A velocity: 79.70 cm/s MV E/A ratio:  1.33  Vishnu Priya Mallipeddi Electronically signed by Diannah Late Mallipeddi Signature Date/Time: 06/03/2023/12:03:33 PM    Final          ______________________________________________________________________________________________       Risk Assessment/Calculations:             Physical Exam:   VS:  BP 117/81   Pulse 84    Ht 5' 6 (1.676 m)   Wt 124 lb (56.2 kg)   SpO2 100%   BMI 20.01 kg/m     Vitals:   04/15/24 0929  BP: 117/81  Pulse: 84  Height: 5' 6 (1.676 m)  Weight: 124 lb (56.2 kg)  SpO2: 100%  BMI (Calculated): 20.02     Wt Readings from Last 3 Encounters:  04/15/24 124 lb (56.2 kg)  01/15/24 130 lb (59 kg)  12/18/23 135 lb (61.2 kg)    GEN: Well nourished, well developed in no acute distress NECK: No JVD; No carotid bruits CARDIAC: RRR, no murmurs, rubs, gallops RESPIRATORY:  Clear to auscultation without rales, wheezing or rhonchi  ABDOMEN: Soft, non-tender, non-distended EXTREMITIES:  No edema; No deformity   ASSESSMENT AND PLAN: .    PEA arrest 2/2 seizure 05/2023 / Stress induced cardiomyopathy with recovered LVEF / HTN - Now with hypotension with fatigue. NYHA I. Euvolemic and well compensated on exam.  Stop hydralazine . Stop Spironolactone .  Reduce Carvedilol  to  3.125mg  BID.  Reduce Losartan  to 12.5mg  at bedtime.  Hopeful reduced doses will control hypertension, provide cardioprotective benefit given HFrEF with recovered LVEF, without hypotension. If hypotension persists, may need to discontinue either Carvedilol  or Losartan .  Tobacco use - Smoking cessation encouraged. Recommend utilization of 1800QUITNOW.   Anemia - 01/15/24 Hb 10.4. Due to persistent relative hypotension, CBC and anemia panel.  See discussion regarding OAC, as below.  Hx of DVT / Hypercoagulable state - DVT 05/2023 in setting of hospitalization. He has completed 6 months of OAC. Per heme/onc notes required OAC x 6 months. 03/05/24 pharmacy dispensed 30 day supply. Recommend discuss with primary care provider whether to continue OAC given anemia.        Dispo: follow up in 3 months  Signed, Reche GORMAN Finder, NP

## 2024-04-15 NOTE — Patient Instructions (Addendum)
 Medication Instructions:   STOP Hydralazine   STOP Spironolactone   START Carvedilol  3.125mg  BID  START Losartan  12.5mg  (half tablet) every evening  *If you need a refill on your cardiac medications before your next appointment, please call your pharmacy*  Lab Work: Your physician recommends that you return for lab work today: CBC, anemia panel, BMET  If you have labs (blood work) drawn today and your tests are completely normal, you will receive your results only by: MyChart Message (if you have MyChart) OR A paper copy in the mail If you have any lab test that is abnormal or we need to change your treatment, we will call you to review the results.  Follow-Up: At [facility], you and your health needs are our priority.  As part of our continuing mission to provide you with exceptional heart care, our providers are all part of one team.  This team includes your primary Cardiologist (physician) and Advanced Practice Providers or APPs (Physician Assistants and Nurse Practitioners) who all work together to provide you with the care you need, when you need it.  Your next appointment:   Follow up in 3 months with Dr. Lonni or Reche GORMAN Finder, NP   We recommend signing up for the patient portal called MyChart.  Sign up information is provided on this After Visit Summary.  MyChart is used to connect with patients for Virtual Visits (Telemedicine).  Patients are able to view lab/test results, encounter notes, upcoming appointments, etc.  Non-urgent messages can be sent to your provider as well.   To learn more about what you can do with MyChart, go to [email or web address].   Other Instructions  We want your blood pressure for the top number to be between 110-130.

## 2024-04-16 ENCOUNTER — Ambulatory Visit (HOSPITAL_BASED_OUTPATIENT_CLINIC_OR_DEPARTMENT_OTHER): Payer: Self-pay | Admitting: Family

## 2024-04-16 LAB — CBC
Hematocrit: 36.2 % — ABNORMAL LOW (ref 37.5–51.0)
Hemoglobin: 12.3 g/dL — ABNORMAL LOW (ref 13.0–17.7)
MCH: 36 pg — ABNORMAL HIGH (ref 26.6–33.0)
MCHC: 34 g/dL (ref 31.5–35.7)
MCV: 106 fL — ABNORMAL HIGH (ref 79–97)
Platelets: 205 x10E3/uL (ref 150–450)
RBC: 3.42 x10E6/uL — ABNORMAL LOW (ref 4.14–5.80)
RDW: 11.6 % (ref 11.6–15.4)
WBC: 5.5 x10E3/uL (ref 3.4–10.8)

## 2024-04-16 LAB — BASIC METABOLIC PANEL WITH GFR
BUN/Creatinine Ratio: 16 (ref 9–20)
BUN: 12 mg/dL (ref 6–20)
CO2: 14 mmol/L — ABNORMAL LOW (ref 20–29)
Calcium: 8.8 mg/dL (ref 8.7–10.2)
Chloride: 96 mmol/L (ref 96–106)
Creatinine, Ser: 0.77 mg/dL (ref 0.76–1.27)
Glucose: 65 mg/dL — ABNORMAL LOW (ref 70–99)
Potassium: 4.5 mmol/L (ref 3.5–5.2)
Sodium: 135 mmol/L (ref 134–144)
eGFR: 120 mL/min/1.73 (ref >59)

## 2024-04-16 LAB — IRON,TIBC AND FERRITIN PANEL
Ferritin: 1057 ng/mL — ABNORMAL HIGH (ref 30–400)
Iron Saturation: 60 % — ABNORMAL HIGH (ref 15–55)
Iron: 130 ug/dL (ref 38–169)
Total Iron Binding Capacity: 218 ug/dL — ABNORMAL LOW (ref 250–450)
UIBC: 88 ug/dL — ABNORMAL LOW (ref 111–343)

## 2024-07-16 ENCOUNTER — Ambulatory Visit (HOSPITAL_BASED_OUTPATIENT_CLINIC_OR_DEPARTMENT_OTHER): Admitting: Family

## 2024-07-16 ENCOUNTER — Telehealth (HOSPITAL_BASED_OUTPATIENT_CLINIC_OR_DEPARTMENT_OTHER): Payer: Self-pay | Admitting: Cardiology

## 2024-07-16 NOTE — Telephone Encounter (Signed)
 Returned a call back to the pts mother Timothy Hubbard (on [place]).  Pts mother states [facility] advised for them to call our office and cancel his appt with Timothy Finder, NP today at [phone], which they did.   Mother states that [facility] said they will be doing further testing on the pt today.   Mother was calling to inquire if this was ok, or does the pt need to reschedule with our office.   Informed Timothy Hubbard that per Timothy Finder, NP, we need to see the pt back in our clinic within in about 3 weeks, and he should bring those results his PCP is doing on him, with him to that visit.   Scheduled the pt to come into the office in 3 weeks with Timothy Finder, NP for 08/06/24 at 1005.  Advised the pts Mother to have him here 15 mins prior to that appt.   Pts mother verbalized understanding and agrees with this plan.

## 2024-07-16 NOTE — Telephone Encounter (Signed)
 Mother Wileen) called to report patient will be doing heart tests at [facility] today (11/11) and wants advice on next steps.

## 2024-07-16 NOTE — Telephone Encounter (Signed)
 Unclear what heart tests PCP has ordered. He no-showed his visit today. Recommend scheduling cardiology follow up in about 3 weeks and have them bring results from PCP testing.  Nekesha Font S Joyleen Haselton, NP

## 2024-08-06 ENCOUNTER — Ambulatory Visit (INDEPENDENT_AMBULATORY_CARE_PROVIDER_SITE_OTHER): Admitting: Family

## 2024-08-06 ENCOUNTER — Encounter (HOSPITAL_BASED_OUTPATIENT_CLINIC_OR_DEPARTMENT_OTHER): Payer: Self-pay | Admitting: Family

## 2024-08-06 VITALS — BP 128/78 | HR 98 | Ht 65.0 in | Wt 136.5 lb

## 2024-08-06 DIAGNOSIS — I10 Essential (primary) hypertension: Secondary | ICD-10-CM | POA: Diagnosis not present

## 2024-08-06 DIAGNOSIS — Z86718 Personal history of other venous thrombosis and embolism: Secondary | ICD-10-CM | POA: Diagnosis not present

## 2024-08-06 DIAGNOSIS — Z8674 Personal history of sudden cardiac arrest: Secondary | ICD-10-CM | POA: Diagnosis not present

## 2024-08-06 MED ORDER — LOSARTAN POTASSIUM 25 MG PO TABS: 12.5000 mg | ORAL_TABLET | Freq: Every evening | ORAL | 5 refills | Status: AC

## 2024-08-06 MED ORDER — CARVEDILOL 3.125 MG PO TABS: 3.1250 mg | ORAL_TABLET | Freq: Two times a day (BID) | ORAL | 5 refills | Status: AC

## 2024-08-06 NOTE — Patient Instructions (Addendum)
 Medication Instructions:  Continue your current medications.   *If you need a refill on your cardiac medications before your next appointment, please call your pharmacy*  Follow-Up: At [facility] you and your health needs are our priority.  As part of our continuing mission to provide you with exceptional heart care, our providers are all part of one team.  This team includes your primary Cardiologist (physician) and Advanced Practice Providers or APPs (Physician Assistants and Nurse Practitioners) who all work together to provide you with the care you need, when you need it.  Your next appointment:   6-9 month(s)  Provider:   Shelda Bruckner, MD, Rosaline Bane, NP, or Reche Finder, NP    We recommend signing up for the patient portal called MyChart.  Sign up information is provided on this After Visit Summary.  MyChart is used to connect with patients for Virtual Visits (Telemedicine).  Patients are able to view lab/test results, encounter notes, upcoming appointments, etc.  Non-urgent messages can be sent to your provider as well.   To learn more about what you can do with MyChart, go to [email or web address].   Other Instructions         Managing the Challenge of Quitting Smoking Quitting smoking is a physical and mental challenge. You may have cravings, withdrawal symptoms, and temptation to smoke. Before quitting, work with your health care provider to make a plan that can help you manage quitting. Making a plan before you quit may keep you from smoking when you have the urge to smoke while trying to quit. How to manage lifestyle changes Managing stress Stress can make you want to smoke, and wanting to smoke may cause stress. It is important to find ways to manage your stress. You could try some of the following: Practice relaxation techniques. Breathe slowly and deeply, in through your nose and out through your mouth. Listen to music. Soak in a bath or  take a shower. Imagine a peaceful place or vacation. Get some support. Talk with family or friends about your stress. Join a support group. Talk with a counselor or therapist. Get some physical activity. Go for a walk, run, or bike ride. Play a favorite sport. Practice yoga.  Medicines Talk with your health care provider about medicines that might help you deal with cravings and make quitting easier for you. Relationships Social situations can be difficult when you are quitting smoking. To manage this, you can: Avoid parties and other social situations where people might be smoking. Avoid alcohol. Leave right away if you have the urge to smoke. Explain to your family and friends that you are quitting smoking. Ask for support and let them know you might be a bit grumpy. Plan activities where smoking is not an option. General instructions Be aware that many people gain weight after they quit smoking. However, not everyone does. To keep from gaining weight, have a plan in place before you quit, and stick to the plan after you quit. Your plan should include: Eating healthy snacks. When you have a craving, it may help to: Eat popcorn, or try carrots, celery, or other cut vegetables. Chew sugar-free gum. Changing how you eat. Eat small portion sizes at meals. Eat 4-6 small meals throughout the day instead of 1-2 large meals a day. Be mindful when you eat. You should avoid watching television or doing other things that might distract you as you eat. Exercising regularly. Make time to exercise each day. If you do not have  time for a long workout, do short bouts of exercise for 5-10 minutes several times a day. Do some form of strengthening exercise, such as weight lifting. Do some exercise that gets your heart beating and causes you to breathe deeply, such as walking fast, running, swimming, or biking. This is very important. Drinking plenty of water  or other low-calorie or no-calorie  drinks. Drink enough fluid to keep your urine pale yellow.  How to recognize withdrawal symptoms Your body and mind may experience discomfort as you try to get used to not having nicotine  in your system. These effects are called withdrawal symptoms. They may include: Feeling hungrier than normal. Having trouble concentrating. Feeling irritable or restless. Having trouble sleeping. Feeling depressed. Craving a cigarette. These symptoms may surprise you, but they are normal to have when quitting smoking. To manage withdrawal symptoms: Avoid places, people, and activities that trigger your cravings. Remember why you want to quit. Get plenty of sleep. Avoid coffee and other drinks that contain caffeine. These may worsen some of your symptoms. How to manage cravings Come up with a plan for how to deal with your cravings. The plan should include the following: A definition of the specific situation you want to deal with. An activity or action you will take to replace smoking. A clear idea for how this action will help. The name of someone who could help you with this. Cravings usually last for 5-10 minutes. Consider taking the following actions to help you with your plan to deal with cravings: Keep your mouth busy. Chew sugar-free gum. Suck on hard candies or a straw. Brush your teeth. Keep your hands and body busy. Change to a different activity right away. Squeeze or play with a ball. Do an activity or a hobby, such as making bead jewelry, practicing needlepoint, or working with wood. Mix up your normal routine. Take a short exercise break. Go for a quick walk, or run up and down stairs. Focus on doing something kind or helpful for someone else. Call a friend or family member to talk during a craving. Join a support group. Contact a quitline. Where to find support To get help or find a support group: Call the National Cancer Institute's Smoking Quitline: 1-800-QUIT-NOW  804 511 6207) Text QUIT to SmokefreeTXT: 521151 Where to find more information Visit these websites to find more information on quitting smoking: U.S. Department of Health and Human Services: www.smokefree.gov American Lung Association: www.freedomfromsmoking.org Centers for Disease Control and Prevention (CDC): footballexhibition.com.br American Heart Association: www.heart.org Contact a health care provider if: You want to change your plan for quitting. The medicines you are taking are not helping. Your eating feels out of control or you cannot sleep. You feel depressed or become very anxious. Summary Quitting smoking is a physical and mental challenge. You will face cravings, withdrawal symptoms, and temptation to smoke again. Preparation can help you as you go through these challenges. Try different techniques to manage stress, handle social situations, and prevent weight gain. You can deal with cravings by keeping your mouth busy (such as by chewing gum), keeping your hands and body busy, calling family or friends, or contacting a quitline for people who want to quit smoking. You can deal with withdrawal symptoms by avoiding places where people smoke, getting plenty of rest, and avoiding drinks that contain caffeine.

## 2024-08-06 NOTE — Progress Notes (Signed)
 Cardiology Office Note:  .   Date:  08/06/2024  ID:  Timothy Hubbard, DOB 05-30-1991, MRN [id number] PCP: Maree Leni Edyth DELENA, MD  [facility] Providers Cardiologist:  Shelda Bruckner, MD    History of Present Illness: .   Timothy Hubbard is a 33 y.o. male with history of PEA arrest 2024, seizures, hypertension, DVT 05/2023 (completed 6 mos OAC), HFrecEF.  In September 2024 he had witnessed seizure followed by multiple PEA cardiac arrest.  Initial echo LVEF 60 to 65% but had another PEA arrest and EF after that 20-25%.  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  EF recovered on recheck to 55 to 60%.  Hospital course complicated by hemoperitoneum, DIC, DVT, ATN, shock liver.  Seen by Dr. Bruckner  06/2023.  No seizures since discharge.  He was smoking 4 to 5 cigarettes/day which is decreased from previous 1 to 1.5 packs/day.  BP was at goal and he was recommended to continue carvedilol , losartan , spironolactone , hydralazine .  Seen 12/18/23.  He was getting medications for adherence packaging with [facility].  Due to hypotension hydralazine  reduced from 50 mg 3 times daily to 25 mg twice daily.  He was overall asymptomatic regarding his hypotension.  At visit 04/15/2024 due to hypotension and fatigue hydralazine  is spironolactone  discontinued.  Carvedilol  reduced to 3.125 mg twice daily.  Losartan  reduced to 12.5 mg nightly.  Updated CBC and anemia panel showed hemoglobin improved from previous.  Of note he missed cardiology visit 07/16/2024 as his primary care was doing heart tests. Unclear what testing was performed.   Presents today for follow-up independently. Reports blood pressure has been well controlled at goal <130/80 after medications. Enjoys spending time with his 54 year old daughter. Reports no shortness of breath nor dyspnea on exertion. Reports no chest pain, pressure, or tightness. No edema, orthopnea, PND. Reports no palpitations.  NO lightheadedness,  dizziness. Energy level has improved. Had echocardiogram at PCP, reports results were good - requested from [facility].   Prior BP cuff verification Home BP cuff: 122/94 Manual BP: 120/80  ROS: Please see the history of present illness.    All other systems reviewed and are negative.   Studies Reviewed: .       Cardiac Studies & Procedures   ______________________________________________________________________________________________     ECHOCARDIOGRAM  ECHOCARDIOGRAM COMPLETE 06/03/2023  Narrative ECHOCARDIOGRAM REPORT    Patient Name:   Timothy Hubbard Date of Exam: 06/03/2023 Medical Rec #:  [id number]     Height:       66.0 in Accession #:    [id number]    Weight:       186.9 lb Date of Birth:  May 09, 1991     BSA:          1.943 m Patient Age:    32 years      BP:           137/97 mmHg Patient Gender: M             HR:           63 bpm. Exam Location:  Inpatient  Procedure: 2D Echo, Cardiac Doppler, Color Doppler and Strain Analysis  Indications:    Congestive Heart Failure I50.9  History:        Patient has prior history of Echocardiogram examinations. Risk Factors:Hypertension.  Sonographer:    Bari Roar Referring Phys: [phone] [facility]   Sonographer Comments: Global longitudinal strain was attempted. IMPRESSIONS   1. Left ventricular  ejection fraction, by estimation, is 55 to 60%. The left ventricle has normal function. The left ventricle has no regional wall motion abnormalities. Left ventricular diastolic parameters were normal. 2. Right ventricular systolic function is normal. The right ventricular size is normal. There is mildly elevated pulmonary artery systolic pressure. The estimated right ventricular systolic pressure is 42.5 mmHg. 3. Large pleural effusion in the left lateral region. 4. The mitral valve is normal in structure. Trivial mitral valve regurgitation. No evidence of mitral stenosis. 5. The aortic valve has an indeterminant number of  cusps. Aortic valve regurgitation is not visualized. No aortic stenosis is present. 6. The inferior vena cava is dilated in size with <50% respiratory variability, suggesting right atrial pressure of 15 mmHg.  Comparison(s): Changes from prior study are noted. LVEF improved from 25% to normal now.  FINDINGS Left Ventricle: Left ventricular ejection fraction, by estimation, is 55 to 60%. The left ventricle has normal function. The left ventricle has no regional wall motion abnormalities. The left ventricular internal cavity size was normal in size. There is no left ventricular hypertrophy. Left ventricular diastolic parameters were normal.  Right Ventricle: The right ventricular size is normal. No increase in right ventricular wall thickness. Right ventricular systolic function is normal. There is mildly elevated pulmonary artery systolic pressure. The tricuspid regurgitant velocity is 2.62 m/s, and with an assumed right atrial pressure of 15 mmHg, the estimated right ventricular systolic pressure is 42.5 mmHg.  Left Atrium: Left atrial size was normal in size.  Right Atrium: Right atrial size was normal in size.  Pericardium: There is no evidence of pericardial effusion.  Mitral Valve: The mitral valve is normal in structure. Trivial mitral valve regurgitation. No evidence of mitral valve stenosis. MV peak gradient, 5.5 mmHg. The mean mitral valve gradient is 2.0 mmHg.  Tricuspid Valve: The tricuspid valve is normal in structure. Tricuspid valve regurgitation is mild . No evidence of tricuspid stenosis.  Aortic Valve: The aortic valve has an indeterminant number of cusps. Aortic valve regurgitation is not visualized. No aortic stenosis is present. Aortic valve mean gradient measures 3.0 mmHg. Aortic valve peak gradient measures 6.0 mmHg. Aortic valve area, by VTI measures 3.26 cm.  Pulmonic Valve: The pulmonic valve was normal in structure. Pulmonic valve regurgitation is trivial. No  evidence of pulmonic stenosis.  Aorta: The aortic root and ascending aorta are structurally normal, with no evidence of dilitation.  Venous: The inferior vena cava is dilated in size with less than 50% respiratory variability, suggesting right atrial pressure of 15 mmHg.  IAS/Shunts: No atrial level shunt detected by color flow Doppler.  Additional Comments: There is a large pleural effusion in the left lateral region.   LEFT VENTRICLE PLAX 2D LVIDd:         4.00 cm   Diastology LVIDs:         3.00 cm   LV e' medial:    14.10 cm/s LV PW:         0.70 cm   LV E/e' medial:  7.5 LV IVS:        0.90 cm   LV e' lateral:   15.00 cm/s LVOT diam:     2.00 cm   LV E/e' lateral: 7.1 LV SV:         70 LV SV Index:   36 LVOT Area:     3.14 cm   RIGHT VENTRICLE RV Basal diam:  3.00 cm RV Mid diam:    2.40 cm RV  S prime:     11.90 cm/s TAPSE (M-mode): 2.0 cm  LEFT ATRIUM             Index        RIGHT ATRIUM           Index LA diam:        3.50 cm 1.80 cm/m   RA Area:     11.50 cm LA Vol (A2C):   55.3 ml 28.46 ml/m  RA Volume:   24.50 ml  12.61 ml/m LA Vol (A4C):   61.6 ml 31.70 ml/m LA Biplane Vol: 58.4 ml 30.06 ml/m AORTIC VALVE                    PULMONIC VALVE AV Area (Vmax):    2.99 cm     PV Vmax:          0.94 m/s AV Area (Vmean):   2.93 cm     PV Peak grad:     3.5 mmHg AV Area (VTI):     3.26 cm     PR End Diast Vel: 6.10 msec AV Vmax:           122.00 cm/s  RVOT Peak grad:   3 mmHg AV Vmean:          81.400 cm/s AV VTI:            0.215 m AV Peak Grad:      6.0 mmHg AV Mean Grad:      3.0 mmHg LVOT Vmax:         116.00 cm/s LVOT Vmean:        76.000 cm/s LVOT VTI:          0.223 m LVOT/AV VTI ratio: 1.04  AORTA Ao Sinus diam: 2.30 cm Ao STJ diam:   2.3 cm Ao Asc diam:   2.60 cm  MITRAL VALVE                TRICUSPID VALVE MV Area (PHT): 4.57 cm     TR Peak grad:   27.5 mmHg MV Area VTI:   1.81 cm     TR Vmax:        262.00 cm/s MV Peak grad:  5.5  mmHg MV Mean grad:  2.0 mmHg     SHUNTS MV Vmax:       1.17 m/s     Systemic VTI:  0.22 m MV Vmean:      67.2 cm/s    Systemic Diam: 2.00 cm MV Decel Time: 166 msec MV E velocity: 106.00 cm/s MV A velocity: 79.70 cm/s MV E/A ratio:  1.33  Vishnu Priya Mallipeddi Electronically signed by Diannah Late Mallipeddi Signature Date/Time: 06/03/2023/12:03:33 PM    Final          ______________________________________________________________________________________________         Risk Assessment/Calculations:             Physical Exam:   VS:  BP 128/78 (BP Location: Right Arm, Patient Position: Sitting, Cuff Size: Normal)   Pulse 98   Ht 5' 5 (1.651 m)   Wt 136 lb 8 oz (61.9 kg)   SpO2 98%   BMI 22.71 kg/m     Vitals:   08/06/24 1001  BP: 128/78  Pulse: 98  Height: 5' 5 (1.651 m)  Weight: 136 lb 8 oz (61.9 kg)  SpO2: 98%  BMI (Calculated): 22.71      Wt Readings from Last 3 Encounters:  08/06/24 136 lb 8 oz (61.9 kg)  04/15/24 124 lb (56.2 kg)  01/15/24 130 lb (59 kg)    GEN: Well nourished, well developed in no acute distress NECK: No JVD; No carotid bruits CARDIAC: RRR, no murmurs, rubs, gallops RESPIRATORY:  Clear to auscultation without rales, wheezing or rhonchi  ABDOMEN: Soft, non-tender, non-distended EXTREMITIES:  No edema; No deformity   ASSESSMENT AND PLAN: .    PEA arrest 2/2 seizure 05/2023 / Stress induced cardiomyopathy with recovered LVEF / HTN - NYHA I. Euvolemic and well compensated on exam.  Continue Carvedilol  to 3.125mg  BID, Losartan  12.5mg  at bedtime. Refills provided.  Continue for cardioprotective benefit given HFrEF with recovered LVEF. If hypotension recurs, may need to discontinue either Carvedilol  or Losartan .  Tobacco use - Smoking cessation encouraged. Recommend utilization of 1800QUITNOW. Provided resources. Congratulated on reducing from 1 PPD to 1 pack over 3 days.   Anemia - Continue to follow with PCP. 04/15/24 Hb improved  to 12.3. no longer requiring iron supplementation.  Hx of DVT / Hypercoagulable state - DVT 05/2023 in setting of hospitalization. He has completed 6 months of OAC. Per heme/onc notes required OAC x 6 months. No longer requiring OAC.       Dispo: follow up in 6-9 months  Signed, Reche GORMAN Finder, NP
